# Patient Record
Sex: Male | Born: 1940 | Race: White | Hispanic: No | Marital: Married | Smoking: Never smoker
Health system: Southern US, Community
[De-identification: ages and names within clinical notes are randomized; demographics above are authoritative.]

## PROBLEM LIST (undated history)

## (undated) DIAGNOSIS — R351 Nocturia: Secondary | ICD-10-CM

## (undated) DIAGNOSIS — M5137 Other intervertebral disc degeneration, lumbosacral region: Secondary | ICD-10-CM

## (undated) DIAGNOSIS — I219 Acute myocardial infarction, unspecified: Secondary | ICD-10-CM

## (undated) DIAGNOSIS — N200 Calculus of kidney: Secondary | ICD-10-CM

## (undated) DIAGNOSIS — G709 Myoneural disorder, unspecified: Secondary | ICD-10-CM

## (undated) DIAGNOSIS — M545 Low back pain, unspecified: Secondary | ICD-10-CM

## (undated) DIAGNOSIS — Z87442 Personal history of urinary calculi: Secondary | ICD-10-CM

## (undated) DIAGNOSIS — I4510 Unspecified right bundle-branch block: Secondary | ICD-10-CM

## (undated) DIAGNOSIS — Z8719 Personal history of other diseases of the digestive system: Secondary | ICD-10-CM

## (undated) DIAGNOSIS — T4145XA Adverse effect of unspecified anesthetic, initial encounter: Secondary | ICD-10-CM

## (undated) DIAGNOSIS — D509 Iron deficiency anemia, unspecified: Secondary | ICD-10-CM

## (undated) DIAGNOSIS — N183 Chronic kidney disease, stage 3 unspecified: Secondary | ICD-10-CM

## (undated) DIAGNOSIS — E785 Hyperlipidemia, unspecified: Secondary | ICD-10-CM

## (undated) DIAGNOSIS — M1990 Unspecified osteoarthritis, unspecified site: Secondary | ICD-10-CM

## (undated) DIAGNOSIS — Z955 Presence of coronary angioplasty implant and graft: Secondary | ICD-10-CM

## (undated) DIAGNOSIS — K219 Gastro-esophageal reflux disease without esophagitis: Secondary | ICD-10-CM

## (undated) DIAGNOSIS — T8859XA Other complications of anesthesia, initial encounter: Secondary | ICD-10-CM

## (undated) DIAGNOSIS — G8929 Other chronic pain: Secondary | ICD-10-CM

## (undated) DIAGNOSIS — I2510 Atherosclerotic heart disease of native coronary artery without angina pectoris: Secondary | ICD-10-CM

## (undated) DIAGNOSIS — Z85820 Personal history of malignant melanoma of skin: Secondary | ICD-10-CM

## (undated) DIAGNOSIS — M51379 Other intervertebral disc degeneration, lumbosacral region without mention of lumbar back pain or lower extremity pain: Secondary | ICD-10-CM

## (undated) DIAGNOSIS — I10 Essential (primary) hypertension: Secondary | ICD-10-CM

## (undated) DIAGNOSIS — E119 Type 2 diabetes mellitus without complications: Secondary | ICD-10-CM

## (undated) DIAGNOSIS — Z794 Long term (current) use of insulin: Secondary | ICD-10-CM

## (undated) DIAGNOSIS — I252 Old myocardial infarction: Secondary | ICD-10-CM

## (undated) DIAGNOSIS — Z7901 Long term (current) use of anticoagulants: Secondary | ICD-10-CM

## (undated) HISTORY — PX: MELANOMA EXCISION: SHX5266

## (undated) HISTORY — DX: Atherosclerotic heart disease of native coronary artery without angina pectoris: I25.10

## (undated) HISTORY — PX: CATARACT EXTRACTION W/ INTRAOCULAR LENS  IMPLANT, BILATERAL: SHX1307

## (undated) HISTORY — PX: ANAL FISSURE REPAIR: SHX2312

## (undated) HISTORY — DX: Hyperlipidemia, unspecified: E78.5

## (undated) HISTORY — PX: HAND TENDON SURGERY: SHX663

## (undated) HISTORY — PX: BACK SURGERY: SHX140

## (undated) HISTORY — DX: Unspecified right bundle-branch block: I45.10

## (undated) HISTORY — PX: KNEE ARTHROSCOPY: SHX127

## (undated) HISTORY — PX: MEDIAL PARTIAL KNEE REPLACEMENT: SHX5965

---

## 1983-02-07 HISTORY — PX: SHOULDER SURGERY: SHX246

## 1997-11-03 ENCOUNTER — Encounter: Admission: RE | Admit: 1997-11-03 | Discharge: 1998-02-01 | Payer: Self-pay | Admitting: Gastroenterology

## 2000-08-03 ENCOUNTER — Encounter: Payer: Self-pay | Admitting: Emergency Medicine

## 2000-08-03 ENCOUNTER — Emergency Department (HOSPITAL_COMMUNITY): Admission: EM | Admit: 2000-08-03 | Discharge: 2000-08-03 | Payer: Self-pay | Admitting: Emergency Medicine

## 2002-04-22 ENCOUNTER — Encounter: Payer: Self-pay | Admitting: Gastroenterology

## 2002-04-22 ENCOUNTER — Encounter: Admission: RE | Admit: 2002-04-22 | Discharge: 2002-04-22 | Payer: Self-pay | Admitting: Gastroenterology

## 2002-10-29 ENCOUNTER — Ambulatory Visit (HOSPITAL_COMMUNITY): Admission: RE | Admit: 2002-10-29 | Discharge: 2002-10-29 | Payer: Self-pay | Admitting: Orthopedic Surgery

## 2002-10-29 ENCOUNTER — Ambulatory Visit (HOSPITAL_BASED_OUTPATIENT_CLINIC_OR_DEPARTMENT_OTHER): Admission: RE | Admit: 2002-10-29 | Discharge: 2002-10-29 | Payer: Self-pay | Admitting: Orthopedic Surgery

## 2006-09-10 ENCOUNTER — Emergency Department (HOSPITAL_COMMUNITY): Admission: EM | Admit: 2006-09-10 | Discharge: 2006-09-11 | Payer: Self-pay | Admitting: Emergency Medicine

## 2007-03-28 ENCOUNTER — Encounter: Admission: RE | Admit: 2007-03-28 | Discharge: 2007-04-24 | Payer: Self-pay | Admitting: Orthopedic Surgery

## 2007-10-10 ENCOUNTER — Encounter: Admission: RE | Admit: 2007-10-10 | Discharge: 2007-10-10 | Payer: Self-pay | Admitting: Nephrology

## 2008-01-23 ENCOUNTER — Encounter: Admission: RE | Admit: 2008-01-23 | Discharge: 2008-02-06 | Payer: Self-pay | Admitting: Orthopedic Surgery

## 2008-02-07 ENCOUNTER — Encounter: Admission: RE | Admit: 2008-02-07 | Discharge: 2008-03-24 | Payer: Self-pay | Admitting: Orthopedic Surgery

## 2009-07-19 ENCOUNTER — Ambulatory Visit (HOSPITAL_COMMUNITY): Admission: RE | Admit: 2009-07-19 | Discharge: 2009-07-19 | Payer: Self-pay | Admitting: Urology

## 2009-07-21 ENCOUNTER — Ambulatory Visit: Payer: Self-pay | Admitting: Radiology

## 2009-07-21 ENCOUNTER — Observation Stay (HOSPITAL_COMMUNITY): Admission: EM | Admit: 2009-07-21 | Discharge: 2009-07-22 | Payer: Self-pay | Admitting: Emergency Medicine

## 2009-07-21 ENCOUNTER — Encounter: Payer: Self-pay | Admitting: Emergency Medicine

## 2010-04-24 LAB — BASIC METABOLIC PANEL
BUN: 16 mg/dL (ref 6–23)
BUN: 19 mg/dL (ref 6–23)
CO2: 30 mEq/L (ref 19–32)
CO2: 30 mEq/L (ref 19–32)
Calcium: 8.9 mg/dL (ref 8.4–10.5)
Calcium: 8.9 mg/dL (ref 8.4–10.5)
Chloride: 100 mEq/L (ref 96–112)
Chloride: 98 mEq/L (ref 96–112)
Creatinine, Ser: 1.3 mg/dL (ref 0.4–1.5)
Creatinine, Ser: 1.47 mg/dL (ref 0.4–1.5)
GFR calc Af Amer: 58 mL/min — ABNORMAL LOW (ref >60)
GFR calc Af Amer: >60 mL/min (ref >60)
GFR calc non Af Amer: 48 mL/min — ABNORMAL LOW (ref >60)
GFR calc non Af Amer: 55 mL/min — ABNORMAL LOW (ref >60)
Glucose, Bld: 105 mg/dL — ABNORMAL HIGH (ref 70–99)
Glucose, Bld: 201 mg/dL — ABNORMAL HIGH (ref 70–99)
Potassium: 4.2 mEq/L (ref 3.5–5.1)
Potassium: 4.9 mEq/L (ref 3.5–5.1)
Sodium: 137 mEq/L (ref 135–145)
Sodium: 139 mEq/L (ref 135–145)

## 2010-04-24 LAB — CBC
HCT: 35.5 % — ABNORMAL LOW (ref 39.0–52.0)
HCT: 41.9 % (ref 39.0–52.0)
Hemoglobin: 12.8 g/dL — ABNORMAL LOW (ref 13.0–17.0)
Hemoglobin: 13.8 g/dL (ref 13.0–17.0)
MCHC: 33 g/dL (ref 30.0–36.0)
MCHC: 35.9 g/dL (ref 30.0–36.0)
MCV: 86.1 fL (ref 78.0–100.0)
MCV: 89 fL (ref 78.0–100.0)
Platelets: 168 10*3/uL (ref 150–400)
Platelets: 191 10*3/uL (ref 150–400)
RBC: 4.13 MIL/uL — ABNORMAL LOW (ref 4.22–5.81)
RBC: 4.7 MIL/uL (ref 4.22–5.81)
RDW: 13.1 % (ref 11.5–15.5)
RDW: 13.5 % (ref 11.5–15.5)
WBC: 11.3 10*3/uL — ABNORMAL HIGH (ref 4.0–10.5)
WBC: 7.9 10*3/uL (ref 4.0–10.5)

## 2010-04-24 LAB — URINALYSIS, ROUTINE W REFLEX MICROSCOPIC
Bilirubin Urine: NEGATIVE
Glucose, UA: 100 mg/dL — AB
Ketones, ur: NEGATIVE mg/dL
Nitrite: NEGATIVE
Protein, ur: 30 mg/dL — AB
Specific Gravity, Urine: 1.015 (ref 1.005–1.030)
Urobilinogen, UA: 0.2 mg/dL (ref 0.0–1.0)
pH: 5.5 (ref 5.0–8.0)

## 2010-04-24 LAB — GLUCOSE, CAPILLARY
Glucose-Capillary: 108 mg/dL — ABNORMAL HIGH (ref 70–99)
Glucose-Capillary: 111 mg/dL — ABNORMAL HIGH (ref 70–99)
Glucose-Capillary: 112 mg/dL — ABNORMAL HIGH (ref 70–99)
Glucose-Capillary: 176 mg/dL — ABNORMAL HIGH (ref 70–99)
Glucose-Capillary: 190 mg/dL — ABNORMAL HIGH (ref 70–99)
Glucose-Capillary: 82 mg/dL (ref 70–99)
Glucose-Capillary: 95 mg/dL (ref 70–99)

## 2010-04-24 LAB — URINE MICROSCOPIC-ADD ON

## 2010-04-24 LAB — DIFFERENTIAL
Basophils Absolute: 0 10*3/uL (ref 0.0–0.1)
Basophils Relative: 0 % (ref 0–1)
Eosinophils Absolute: 0.1 10*3/uL (ref 0.0–0.7)
Eosinophils Relative: 1 % (ref 0–5)
Lymphocytes Relative: 10 % — ABNORMAL LOW (ref 12–46)
Lymphs Abs: 1.1 10*3/uL (ref 0.7–4.0)
Monocytes Absolute: 0.7 10*3/uL (ref 0.1–1.0)
Monocytes Relative: 6 % (ref 3–12)
Neutro Abs: 9.4 10*3/uL — ABNORMAL HIGH (ref 1.7–7.7)
Neutrophils Relative %: 83 % — ABNORMAL HIGH (ref 43–77)

## 2010-04-25 LAB — GLUCOSE, CAPILLARY
Glucose-Capillary: 169 mg/dL — ABNORMAL HIGH (ref 70–99)
Glucose-Capillary: 68 mg/dL — ABNORMAL LOW (ref 70–99)
Glucose-Capillary: 76 mg/dL (ref 70–99)

## 2010-05-13 ENCOUNTER — Emergency Department (HOSPITAL_BASED_OUTPATIENT_CLINIC_OR_DEPARTMENT_OTHER)
Admission: EM | Admit: 2010-05-13 | Discharge: 2010-05-13 | Disposition: A | Discharge status: Home / Self Care | Payer: Medicare Other | Attending: Emergency Medicine | Admitting: Emergency Medicine

## 2010-05-13 ENCOUNTER — Emergency Department (INDEPENDENT_AMBULATORY_CARE_PROVIDER_SITE_OTHER): Payer: Medicare Other

## 2010-05-13 DIAGNOSIS — E119 Type 2 diabetes mellitus without complications: Secondary | ICD-10-CM | POA: Insufficient documentation

## 2010-05-13 DIAGNOSIS — Y92009 Unspecified place in unspecified non-institutional (private) residence as the place of occurrence of the external cause: Secondary | ICD-10-CM | POA: Insufficient documentation

## 2010-05-13 DIAGNOSIS — I10 Essential (primary) hypertension: Secondary | ICD-10-CM | POA: Insufficient documentation

## 2010-05-13 DIAGNOSIS — E7800 Pure hypercholesterolemia, unspecified: Secondary | ICD-10-CM | POA: Insufficient documentation

## 2010-05-13 DIAGNOSIS — W19XXXA Unspecified fall, initial encounter: Secondary | ICD-10-CM

## 2010-05-13 DIAGNOSIS — X500XXA Overexertion from strenuous movement or load, initial encounter: Secondary | ICD-10-CM | POA: Insufficient documentation

## 2010-05-13 DIAGNOSIS — S82899A Other fracture of unspecified lower leg, initial encounter for closed fracture: Secondary | ICD-10-CM

## 2010-06-24 NOTE — Op Note (Signed)
NAME:  Matthew [name], Matthew [name]                          ACCOUNT NO.:  [id number]   MEDICAL RECORD NO.:  [id number]                   PATIENT TYPE:  AMB   LOCATION:  DSC                                  FACILITY:  [facility]   PHYSICIAN:  Artist Pais. Mina Marble, M.D.           DATE OF BIRTH:  1941/01/06   DATE OF PROCEDURE:  10/29/2002  DATE OF DISCHARGE:                                 OPERATIVE REPORT   PREOPERATIVE DIAGNOSES:  1. Right index metacarpophalangeal joint contracture..  2. Right index finger extensor scarring.   POSTOPERATIVE DIAGNOSES:  1. Right index metacarpophalangeal joint contracture.  2. Right index finger extensor scarring.   PROCEDURE:  1. Right index finger metacarpophalangeal joint capsulectomy.  2. Right extensor indicis proprius and extensor digitorum communis to the     index tenolysis.   SURGEON:  Artist Pais. Mina Marble, M.D.   ASSISTANT:  Aura Fey. Bobbe Medico.   ANESTHESIA:  General.   TOURNIQUET TIME:  Thirty-five minutes.   COMPLICATIONS:  No complications.   DRAINS:  No drains.   SPECIMENS:  Cultures x2 sent.   OPERATIVE REPORT:  The patient was taken to the operating room and after the  induction of adequate general anesthesia, the right upper extremity was  prepped and draped in the usual sterile fashion.  An Esmarch was used to  exsanguinate the limb.  The tourniquet was then inflated to 250 mmHg.  At  this point in time, a Brunner-type incision was made over the dorsoradial  aspect of the hand and wrist over the area of his previous suspensionplasty  and flaps were raised accordingly.  The ECRB and ECRL tendons were  identified and the EDC and EIP of the index finger were also identified.  There was significant scarring on the ulnar-most aspect of the incision  where the EDC and the EIP to the index finger ran through the interval  between the second and fourth compartments.  This was released of all  scarring throughout the length of the incision and  once this was done, a  second incision was made over the metacarpophalangeal joint of the index  finger on the right hand longitudinally, 2 to 3 cm in length.  The incision  was taken down through the skin and subcutaneous tissues.  Flaps were raised  accordingly.  The interval between the [facility] and EIP was incised, the capsule  was incised and a small capsulectomy was performed.  At this point in time,  gentle manipulation revealed the joint to be 0 degrees extension and about  85 to 90 degrees of flexion.  A small amount of the collateral ligaments on  the most dorsal aspect on the radial and ulnar sides of the  metacarpophalangeal joint exiting on the right were then released with a 15  blade off the metacarpal head and neck junction, allowing complete  metacarpophalangeal joint flexion.  The wound was then thoroughly irrigated.  The  interval between the [facility] and EIP was loosely closed with 4-0 Vicryl and  the skin with 5-0 nylon in horizontal mattress sutures over the  metacarpophalangeal joint and a 3-0 Prolene subcuticular stitch for the  proximal incision over the tenolysis site.  The patient tolerated the  procedure well and was placed in a sterile dressing of Xeroform, 4 x 4's and  compressive wrap.  The patient tolerated the procedure well and went to  recovery room in stable fashion.                                               Artist Pais Mina Marble, M.D.    MAW/MEDQ  D:  10/29/2002  T:  10/30/2002  Job:  [id number]

## 2010-09-23 ENCOUNTER — Emergency Department (INDEPENDENT_AMBULATORY_CARE_PROVIDER_SITE_OTHER): Payer: Medicare Other

## 2010-09-23 ENCOUNTER — Emergency Department (HOSPITAL_BASED_OUTPATIENT_CLINIC_OR_DEPARTMENT_OTHER)
Admission: EM | Admit: 2010-09-23 | Discharge: 2010-09-24 | Disposition: A | Discharge status: Home / Self Care | Payer: Medicare Other | Attending: Emergency Medicine | Admitting: Emergency Medicine

## 2010-09-23 ENCOUNTER — Encounter: Payer: Self-pay | Admitting: *Deleted

## 2010-09-23 DIAGNOSIS — R109 Unspecified abdominal pain: Secondary | ICD-10-CM | POA: Insufficient documentation

## 2010-09-23 DIAGNOSIS — N201 Calculus of ureter: Secondary | ICD-10-CM

## 2010-09-23 DIAGNOSIS — N1330 Unspecified hydronephrosis: Secondary | ICD-10-CM | POA: Insufficient documentation

## 2010-09-23 DIAGNOSIS — R112 Nausea with vomiting, unspecified: Secondary | ICD-10-CM | POA: Insufficient documentation

## 2010-09-23 DIAGNOSIS — K5730 Diverticulosis of large intestine without perforation or abscess without bleeding: Secondary | ICD-10-CM

## 2010-09-23 DIAGNOSIS — E119 Type 2 diabetes mellitus without complications: Secondary | ICD-10-CM | POA: Insufficient documentation

## 2010-09-23 LAB — CBC
HCT: 43.4 % (ref 39.0–52.0)
Hemoglobin: 15.2 g/dL (ref 13.0–17.0)
MCH: 30.5 pg (ref 26.0–34.0)
MCHC: 35 g/dL (ref 30.0–36.0)
MCV: 87.1 fL (ref 78.0–100.0)
Platelets: 250 10*3/uL (ref 150–400)
RBC: 4.98 MIL/uL (ref 4.22–5.81)
RDW: 13.2 % (ref 11.5–15.5)
WBC: 10.6 10*3/uL — ABNORMAL HIGH (ref 4.0–10.5)

## 2010-09-23 LAB — BASIC METABOLIC PANEL
BUN: 26 mg/dL — ABNORMAL HIGH (ref 6–23)
CO2: 26 mEq/L (ref 19–32)
Calcium: 9.6 mg/dL (ref 8.4–10.5)
Chloride: 98 mEq/L (ref 96–112)
Creatinine, Ser: 1.5 mg/dL — ABNORMAL HIGH (ref 0.50–1.35)
GFR calc Af Amer: 56 mL/min — ABNORMAL LOW (ref >60)
GFR calc non Af Amer: 47 mL/min — ABNORMAL LOW (ref >60)
Glucose, Bld: 220 mg/dL — ABNORMAL HIGH (ref 70–99)
Potassium: 3.8 mEq/L (ref 3.5–5.1)
Sodium: 136 mEq/L (ref 135–145)

## 2010-09-23 MED ORDER — ONDANSETRON HCL 4 MG/2ML IJ SOLN
4.0000 mg | Freq: Once | INTRAMUSCULAR | Status: AC
  Administered 2010-09-23: 4 mg via INTRAVENOUS
  Filled 2010-09-23: qty 2

## 2010-09-23 MED ORDER — ONDANSETRON HCL 4 MG/2ML IJ SOLN
INTRAMUSCULAR | Status: AC
  Administered 2010-09-23: 4 mg via INTRAVENOUS
  Filled 2010-09-23: qty 2

## 2010-09-23 MED ORDER — SODIUM CHLORIDE 0.9 % IV SOLN
INTRAVENOUS | Status: DC
  Administered 2010-09-23: 22:00:00 via INTRAVENOUS

## 2010-09-23 MED ORDER — ONDANSETRON HCL 4 MG/2ML IJ SOLN
4.0000 mg | Freq: Once | INTRAMUSCULAR | Status: AC
  Administered 2010-09-23: 4 mg via INTRAVENOUS

## 2010-09-23 MED ORDER — HYDROMORPHONE HCL 1 MG/ML IJ SOLN
INTRAMUSCULAR | Status: AC
  Administered 2010-09-23: 1 mg via INTRAVENOUS
  Filled 2010-09-23: qty 1

## 2010-09-23 MED ORDER — HYDROMORPHONE HCL 1 MG/ML IJ SOLN
1.0000 mg | Freq: Once | INTRAMUSCULAR | Status: AC
  Administered 2010-09-23: 1 mg via INTRAVENOUS

## 2010-09-23 MED ORDER — HYDROMORPHONE HCL 1 MG/ML IJ SOLN
1.0000 mg | Freq: Once | INTRAMUSCULAR | Status: AC
  Administered 2010-09-23: 1 mg via INTRAVENOUS
  Filled 2010-09-23: qty 1

## 2010-09-23 MED ORDER — HYDROCODONE-ACETAMINOPHEN 5-325 MG PO TABS: 1.0000 | ORAL_TABLET | Freq: Four times a day (QID) | ORAL | Status: AC | PRN

## 2010-09-23 MED ORDER — NAPROXEN 500 MG PO TABS: 500.0000 mg | ORAL_TABLET | Freq: Two times a day (BID) | ORAL | Status: AC

## 2010-09-23 MED ORDER — ONDANSETRON HCL 4 MG PO TABS: 4.0000 mg | ORAL_TABLET | Freq: Four times a day (QID) | ORAL | Status: AC

## 2010-09-23 MED ORDER — TAMSULOSIN HCL 0.4 MG PO CAPS: 0.4000 mg | ORAL_CAPSULE | Freq: Every day | ORAL | Status: DC

## 2010-09-23 NOTE — ED Provider Notes (Signed)
History     CSN: [id number] Arrival date & time: 09/23/2010  9:32 PM  Chief Complaint  Patient presents with  . Flank Pain   Patient is a 70 y.o. male presenting with flank pain. The history is provided by the patient and the spouse.  Flank Pain The current episode started 1 to 2 hours ago. The problem occurs constantly. The problem has been gradually worsening. Associated symptoms include abdominal pain. Pertinent negatives include no chest pain, no headaches and no shortness of breath. The symptoms are aggravated by nothing. The symptoms are relieved by nothing. He has tried nothing for the symptoms.    Past Medical History  Diagnosis Date  . Diabetes mellitus   . Kidney stones 2011    Past Surgical History  Procedure Date  . Knee surgery   . Shoulder surgery     No family history on file.  History  Substance Use Topics  . Smoking status: Never Smoker   . Smokeless tobacco: Never Used  . Alcohol Use: No      Review of Systems  Constitutional: Negative for fever.  HENT: Negative for congestion and neck pain.   Respiratory: Negative for cough, chest tightness and shortness of breath.   Cardiovascular: Negative for chest pain.  Gastrointestinal: Positive for nausea, vomiting and abdominal pain.  Genitourinary: Positive for flank pain. Negative for hematuria.  Musculoskeletal: Negative for back pain.  Neurological: Negative for headaches.  Hematological: Does not bruise/bleed easily.  Psychiatric/Behavioral: Negative for confusion.    Physical Exam  BP 196/100  Pulse 72  Temp(Src) 98.3 F (36.8 C) (Oral)  Resp 18  Ht 5\' 9"  (1.753 m)  Wt 226 lb (102.513 kg)  BMI 33.37 kg/m2  SpO2 98%  Physical Exam  Nursing note and vitals reviewed. Constitutional: He is oriented to person, place, and time. He appears well-developed and well-nourished. He appears distressed.  HENT:  Head: Normocephalic and atraumatic.  Eyes: Conjunctivae and EOM are normal. Pupils are  equal, round, and reactive to light.  Neck: Normal range of motion. Neck supple.  Cardiovascular: Normal rate, regular rhythm and normal heart sounds.   Pulmonary/Chest: Effort normal and breath sounds normal. No respiratory distress.  Abdominal: Soft. Bowel sounds are normal. There is no tenderness.  Musculoskeletal: He exhibits no edema.  Neurological: He is alert and oriented to person, place, and time. No cranial nerve deficit. He exhibits normal muscle tone.  Skin: Skin is warm. No rash noted. He is diaphoretic.    ED Course  Procedures  MDM IMPROVED WIT PAIN MEDS CT CONFIRMS LEFT 3 MM URETERAL STONE. HX OF SAME IN PAST. SHOULD BE ABLE TO PASS HAS UROLOGY FU.   Results for orders placed during the hospital encounter of 09/23/10  CBC      Component Value Range   WBC 10.6 (*) 4.0 - 10.5 (K/uL)   RBC 4.98  4.22 - 5.81 (MIL/uL)   Hemoglobin 15.2  13.0 - 17.0 (g/dL)   HCT 40.9  81.1 - 91.4 (%)   MCV 87.1  78.0 - 100.0 (fL)   MCH 30.5  26.0 - 34.0 (pg)   MCHC 35.0  30.0 - 36.0 (g/dL)   RDW 78.2  95.6 - 21.3 (%)   Platelets 250  150 - 400 (K/uL)  BASIC METABOLIC PANEL      Component Value Range   Sodium 136  135 - 145 (mEq/L)   Potassium 3.8  3.5 - 5.1 (mEq/L)   Chloride 98  96 - 112 (mEq/L)  CO2 26  19 - 32 (mEq/L)   Glucose, Bld 220 (*) 70 - 99 (mg/dL)   BUN 26 (*) 6 - 23 (mg/dL)   Creatinine, Ser 1.61 (*) 0.50 - 1.35 (mg/dL)   Calcium 9.6  8.4 - 09.6 (mg/dL)   GFR calc non Af Amer 47 (*) >60 (mL/min)   GFR calc Af Amer 56 (*) >60 (mL/min)   Results for orders placed during the hospital encounter of 09/23/10  CBC      Component Value Range   WBC 10.6 (*) 4.0 - 10.5 (K/uL)   RBC 4.98  4.22 - 5.81 (MIL/uL)   Hemoglobin 15.2  13.0 - 17.0 (g/dL)   HCT 04.5  40.9 - 81.1 (%)   MCV 87.1  78.0 - 100.0 (fL)   MCH 30.5  26.0 - 34.0 (pg)   MCHC 35.0  30.0 - 36.0 (g/dL)   RDW 91.4  78.2 - 95.6 (%)   Platelets 250  150 - 400 (K/uL)  BASIC METABOLIC PANEL      Component Value  Range   Sodium 136  135 - 145 (mEq/L)   Potassium 3.8  3.5 - 5.1 (mEq/L)   Chloride 98  96 - 112 (mEq/L)   CO2 26  19 - 32 (mEq/L)   Glucose, Bld 220 (*) 70 - 99 (mg/dL)   BUN 26 (*) 6 - 23 (mg/dL)   Creatinine, Ser 2.13 (*) 0.50 - 1.35 (mg/dL)   Calcium 9.6  8.4 - 08.6 (mg/dL)   GFR calc non Af Amer 47 (*) >60 (mL/min)   GFR calc Af Amer 56 (*) >60 (mL/min)   Ct Abdomen Pelvis Wo Contrast  09/23/2010  *RADIOLOGY REPORT*  Clinical Data: Left flank pain began 2 hours ago, history kidney stones, diabetes  CT ABDOMEN AND PELVIS WITHOUT CONTRAST  Technique:  Multidetector CT imaging of the abdomen and pelvis was performed following the standard protocol without intravenous contrast. Sagittal and coronal MPR images reconstructed from axial data set.  Comparison: 04/28/2010  Findings: Dependent atelectasis at bilateral lung bases. Few scattered coronary arterial calcifications. Atherosclerotic calcifications aorta without aneurysm. Left hydronephrosis secondary to a 3 mm diameter proximal right ureteral calculus image 54. Extensive peripelvic, perinephric and proximal peri ureteral edema suggesting forniceal rupture. Nonobstructing right renal calculus. Distal ureters and bladder unremarkable.  Within limits of a nonenhanced exam, no focal abnormalities of liver, spleen, pancreas, or adrenal glands. Stomach and small bowel loops unremarkable for technique. Normal appendix. Diverticulosis of sigmoid colon without evidence of diverticulitis. No mass, adenopathy, or free fluid. 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  IMPRESSION: Left hydronephrosis secondary to a 3 mm diameter proximal left ureteral calculus. Significant peripelvic, perinephric and proximal peri ureteral edema left kidney most likely representing forniceal rupture. Sigmoid diverticulosis.  Original Report Authenticated By: Lollie Marrow, M.D.        Shelda Jakes,  MD 09/24/10 0001

## 2010-09-23 NOTE — ED Notes (Signed)
Pt reports flank pain that started 2 hours ago. He has a hx of kidney stones. Denies pain or burning upon urination.

## 2010-09-24 ENCOUNTER — Encounter (HOSPITAL_BASED_OUTPATIENT_CLINIC_OR_DEPARTMENT_OTHER): Payer: Self-pay | Admitting: *Deleted

## 2011-09-13 ENCOUNTER — Other Ambulatory Visit: Payer: Self-pay | Admitting: Neurological Surgery

## 2011-09-13 DIAGNOSIS — M545 Low back pain, unspecified: Secondary | ICD-10-CM

## 2011-09-15 ENCOUNTER — Ambulatory Visit
Admission: RE | Admit: 2011-09-15 | Discharge: 2011-09-15 | Disposition: A | Discharge status: Home / Self Care | Payer: Medicare Other | Source: Ambulatory Visit | Attending: Neurological Surgery | Admitting: Neurological Surgery

## 2011-09-15 DIAGNOSIS — M545 Low back pain, unspecified: Secondary | ICD-10-CM

## 2011-11-06 ENCOUNTER — Other Ambulatory Visit: Payer: Self-pay | Admitting: Neurological Surgery

## 2011-12-07 ENCOUNTER — Other Ambulatory Visit (HOSPITAL_COMMUNITY): Payer: Medicare Other

## 2012-01-01 ENCOUNTER — Encounter (HOSPITAL_COMMUNITY): Payer: Self-pay | Admitting: Pharmacy Technician

## 2012-01-02 ENCOUNTER — Ambulatory Visit (HOSPITAL_COMMUNITY)
Admission: RE | Admit: 2012-01-02 | Discharge: 2012-01-02 | Disposition: A | Discharge status: Home / Self Care | Payer: Medicare Other | Source: Ambulatory Visit | Attending: Anesthesiology | Admitting: Anesthesiology

## 2012-01-02 ENCOUNTER — Encounter (HOSPITAL_COMMUNITY): Payer: Self-pay

## 2012-01-02 ENCOUNTER — Encounter (HOSPITAL_COMMUNITY)
Admission: RE | Admit: 2012-01-02 | Discharge: 2012-01-02 | Disposition: A | Discharge status: Home / Self Care | Payer: Medicare Other | Source: Ambulatory Visit | Attending: Neurological Surgery | Admitting: Neurological Surgery

## 2012-01-02 DIAGNOSIS — Z01811 Encounter for preprocedural respiratory examination: Secondary | ICD-10-CM | POA: Insufficient documentation

## 2012-01-02 HISTORY — DX: Essential (primary) hypertension: I10

## 2012-01-02 HISTORY — DX: Adverse effect of unspecified anesthetic, initial encounter: T41.45XA

## 2012-01-02 HISTORY — DX: Other complications of anesthesia, initial encounter: T88.59XA

## 2012-01-02 HISTORY — DX: Unspecified osteoarthritis, unspecified site: M19.90

## 2012-01-02 LAB — BASIC METABOLIC PANEL
BUN: 13 mg/dL (ref 6–23)
CO2: 30 mEq/L (ref 19–32)
Calcium: 9.7 mg/dL (ref 8.4–10.5)
Chloride: 100 mEq/L (ref 96–112)
Creatinine, Ser: 1.04 mg/dL (ref 0.50–1.35)
GFR calc Af Amer: 83 mL/min — ABNORMAL LOW (ref >90)
GFR calc non Af Amer: 71 mL/min — ABNORMAL LOW (ref >90)
Glucose, Bld: 136 mg/dL — ABNORMAL HIGH (ref 70–99)
Potassium: 4.1 mEq/L (ref 3.5–5.1)
Sodium: 139 mEq/L (ref 135–145)

## 2012-01-02 LAB — CBC
HCT: 44.1 % (ref 39.0–52.0)
Hemoglobin: 15 g/dL (ref 13.0–17.0)
MCH: 30.1 pg (ref 26.0–34.0)
MCHC: 34 g/dL (ref 30.0–36.0)
MCV: 88.4 fL (ref 78.0–100.0)
Platelets: 215 10*3/uL (ref 150–400)
RBC: 4.99 MIL/uL (ref 4.22–5.81)
RDW: 12.8 % (ref 11.5–15.5)
WBC: 5.6 10*3/uL (ref 4.0–10.5)

## 2012-01-02 LAB — TYPE AND SCREEN
ABO/RH(D): O POS
Antibody Screen: NEGATIVE

## 2012-01-02 LAB — SURGICAL PCR SCREEN
MRSA, PCR: NEGATIVE
Staphylococcus aureus: NEGATIVE

## 2012-01-02 LAB — ABO/RH: ABO/RH(D): O POS

## 2012-01-02 NOTE — Pre-Procedure Instructions (Addendum)
[name]  01/02/2012   Your procedure is scheduled on:  Friday, December 6th.  Report to [facility] at 8:30AM. (per Erie Noe).  Call this number if you have problems the morning of surgery: [phone]   Remember:                                    Nothing to eat or drink after Midnight.       Take these medicines the morning of surgery with A SIP OF WATER:  Oxycodone-Acetaminophen (Percocet) and Diphenhydramine (Benadryl) If needed.  Stop taking Aspirin, Coumadin, Plavix, Effient and herbal medications.  Do not take any NSAIDS ie:  Ibuprofen, Advil, Naproxen.    Do not wear jewelry, make-up or nail polish.  Do not wear lotions, powders, or perfumes. You may wear deodorant.  Do not shave 48 hours prior to surgery. Men may shave face and neck.  Do not bring valuables to the hospital.  Contacts, dentures or bridgework may not be worn into surgery.  Leave suitcase in the car. After surgery it may be brought to your room.  For patients admitted to the hospital, checkout time is 11:00 AM the day of discharge.   Patients discharged the day of surgery will not be allowed to drive home.  Name and phone number of your driver: NA  Special Instructions: Shower using CHG 2 nights before surgery and the night before surgery.  If you shower the day of surgery use CHG.  Use special wash - you have one bottle of CHG for all showers.  You should use approximately 1/3 of the bottle for each shower.   Please read over the following fact sheets that you were given: Pain Booklet, Coughing and Deep Breathing, Blood Transfusion Information and Surgical Site Infection Prevention

## 2012-01-02 NOTE — Progress Notes (Signed)
Pt said that he went "flat line" during knee surgery in 2009.  "Too much anesthesia".  My next surgery I heard them talking and  Using a hammer- "too little anesthesia".  The last surgery was just right".  I faxed a request to [facility] requesting all ansesthia and discharge notes since 2009.

## 2012-01-02 NOTE — Progress Notes (Signed)
Pt said that he has had a Stress test in the past , unsure who did it,.  Pt said it was ordered by medical MD, "he one before Dr Donette Larry."  I faxed a request to Dr Venita Sheffield office requesting results and last office note.

## 2012-01-03 ENCOUNTER — Encounter (HOSPITAL_COMMUNITY): Payer: Self-pay | Admitting: Vascular Surgery

## 2012-01-03 NOTE — Consult Note (Addendum)
Anesthesia chart review: Patient is a 71 year old male scheduled for L3-4, L5-S1 PLIF by Dr. Yetta Barre on 01/12/2012. History includes nonsmoker, obesity, diabetes mellitus type 2, hypertension, arthritis, nephrolithiasis, CKD stage II, depression, dysrhythmia without mention of atrial fibrillation.  PCP is Dr. Georgann Housekeeper [facility]).  BP was 191/99 at his PAT visit.  Unfortunately, it was not rechecked.  His PCP notes from 12/26/11 indicated an office BP of 140/80 and home BP of 125/80.  His anesthesia history is documented by his PAT RN as, "Pt said that he went "flat line" during knee surgery in 2009. "Too much anesthesia". My next surgery I heard them talking and Using a hammer- "too little anesthesia". The last surgery was just right". I faxed a request to [facility] requesting all ansesthia and discharge notes since 2009."  I was not asked to see patient during his PAT visit.  Records from [place] are still pending.  EKG on 01/02/2012 showed normal sinus rhythm, right bundle branch block.  His QRS complex has widened since his last EKG on 08/20/09.  He reports a previous stress test, but it was done prior to him seeing Dr. Donette Larry.  ([facility] [place] does not have record of a stress test.)  Chest x-ray on 01/02/2012 showed no acute disease.   Preoperative labs noted. Cr 1.04, glucose 136. CBC WNL.  I called to speak with Mr. Yin to inquire further about his cardiac and anesthesia history.  He was not able to speak with me today because, unfortunately, his mother-in-law died earlier today.    I reviewed currently available information with Anesthesiologist Dr. Noreene Larsson who recommends a pre-operative cardiology evaluation.  I have notified Erie Noe at Dr. Edward Qualia office.    Will follow-up when additional cardiology and anesthesia records are available.  Shonna Chock, PA-C 01/03/12 1600  Addendum: 01/11/12 1450 Case has been moved to 02/02/12.  I did receive anesthesia records from 2008-2009  from [facility].  Notes indicate that he reported that in 1987 he received "so much meds that they 'lost him on the table'" during his left shoulder arthroscopy [facility].  He was kept overnight.  He reportedly had no problems with a right hand surgery in 2005.  In February 2009 he underwent right TKR with "SAB c Lumbar Plexus, propofol/ketamine infusion, no complications."  Patient reported he could hear them talking and using a hammer.  In 01/06/08 he underwent left TKR with regional and spinal without incident.    I'll follow-up once cardiology records are available.

## 2012-01-11 NOTE — Progress Notes (Signed)
Patient had surgery at [facility] will request anesthesia records from [facility]

## 2012-01-25 ENCOUNTER — Encounter (HOSPITAL_COMMUNITY)
Admission: RE | Admit: 2012-01-25 | Discharge: 2012-01-25 | Disposition: A | Discharge status: Home / Self Care | Payer: Medicare Other | Source: Ambulatory Visit | Attending: Neurological Surgery | Admitting: Neurological Surgery

## 2012-01-25 LAB — BASIC METABOLIC PANEL
BUN: 18 mg/dL (ref 6–23)
CO2: 28 mEq/L (ref 19–32)
Calcium: 9.9 mg/dL (ref 8.4–10.5)
Chloride: 101 mEq/L (ref 96–112)
Creatinine, Ser: 1.11 mg/dL (ref 0.50–1.35)
GFR calc Af Amer: 76 mL/min — ABNORMAL LOW (ref >90)
GFR calc non Af Amer: 66 mL/min — ABNORMAL LOW (ref >90)
Glucose, Bld: 158 mg/dL — ABNORMAL HIGH (ref 70–99)
Potassium: 4.3 mEq/L (ref 3.5–5.1)
Sodium: 141 mEq/L (ref 135–145)

## 2012-01-25 LAB — CBC
HCT: 42.9 % (ref 39.0–52.0)
Hemoglobin: 14.4 g/dL (ref 13.0–17.0)
MCH: 30.2 pg (ref 26.0–34.0)
MCHC: 33.6 g/dL (ref 30.0–36.0)
MCV: 89.9 fL (ref 78.0–100.0)
Platelets: 219 10*3/uL (ref 150–400)
RBC: 4.77 MIL/uL (ref 4.22–5.81)
RDW: 12.8 % (ref 11.5–15.5)
WBC: 6.3 10*3/uL (ref 4.0–10.5)

## 2012-01-25 LAB — TYPE AND SCREEN
ABO/RH(D): O POS
Antibody Screen: NEGATIVE

## 2012-01-25 NOTE — Progress Notes (Signed)
Pt had a cardiology workup at [facility].  I have requested results from that office.

## 2012-01-25 NOTE — Pre-Procedure Instructions (Signed)
[address]  01/25/2012   Your procedure is scheduled on: Friday, December 27th.  Report to [facility] at 5:30AM.  Call this number if you have problems the morning of surgery: [phone]   Remember: Nothing to eat or drink after Midnight.   Take these medicines the morning of surgery with A SIP OF WATER: Diphenhydramine (Benadryl) and Oxycodone- Acetaminophen (Percocet) if needed.   Do not wear jewelry, make-up or nail polish.  Do not wear lotions, powders, or perfumes. You may wear deodorant.  Do not shave 48 hours prior to surgery. Men may shave face and neck.  Do not bring valuables to the hospital.  Contacts, dentures or bridgework may not be worn into surgery.  Leave suitcase in the car. After surgery it may be brought to your room.  For patients admitted to the hospital, checkout time is 11:00 AM the day of discharge.   Patients discharged the day of surgery will not be allowed to drive home.  Name and phone number of your driver: NA   Special Instructions: Shower using CHG 2 nights before surgery and the night before surgery.  If you shower the day of surgery use CHG.  Use special wash - you have one bottle of CHG for all showers.  You should use approximately 1/3 of the bottle for each shower.   Please read over the following fact sheets that you were given: Pain Booklet, Coughing and Deep Breathing, Blood Transfusion Information and Surgical Site Infection Prevention

## 2012-01-26 NOTE — Consult Note (Signed)
Anesthesia chart review: Patient is a 71 year old male scheduled for L3-4, L5-S1 PLIF by Dr. Yetta Barre on 02/02/12.  It was initially scheduled for 01/12/2012, but anesthesia requested a cardiology evaluation and his mother-in-law passed away.  History includes nonsmoker, obesity, diabetes mellitus type 2, hypertension, arthritis, nephrolithiasis, CKD stage II, depression, dysrhythmia without mention of atrial fibrillation. PCP is Dr. Georgann Housekeeper [facility]).   His anesthesia history is documented by his PAT RN as, "Pt said that he went "flat line" during knee surgery in 2009 due to "Too much anesthesia". The following surgery he could hear therm talking and using a hammer--"too little anesthesia."  The last surgery was just right". These were all done at [facility]  I reviewed anesthesia records received from [id number].  Notes indicate that he reported that in 1987 he received "so much meds that they 'lost him on the table'" during his left shoulder arthroscopy at a satellite out[facility]patient surgical center. He was kept overnight. He reportedly had no problems with a right hand surgery in 2005. In February 2009 he underwent right TKR with "SAB c Lumbar Plexus, propofol/ketamine infusion, no complications." Patient reported he could hear them talking and using a hammer. In 01/06/08 he underwent left TKR with regional and spinal without incident.   EKG on 01/02/2012 showed normal sinus rhythm, right bundle branch block. His QRS complex has widened since his last EKG on 08/20/09. He was seen by Cardiologist Dr. Florian Buff at [facility] on 01/17/12 for HTN follow-up (following an increase in his anti-hypertensive regimen) and pre-operative evaluation. Patient's blood pressure was better controlled, and he had had a recent low risk stress test.  Ultimately, Dr. Sherril Croon cleared patient for back surgery as planned. His cardiac risk was felt low.   Nuclear stress test on 01/12/12 showed: Rest EKG showed normal sinus  rhythm, right bundle branch block. Stress EKG was equivocal for ischemia with development of additional 1 mm ST depression in V2 through V5. He was hypertensive at rest and stress. He was asymptomatic. Normal myocardial perfusion imaging study without evidence of ischemia or scar and normal left ventricular ejection fraction, 77%. This represents a low risk study.  Chest x-ray on 01/02/2012 showed no acute disease.   Preoperative labs noted. His nasal PCR will be one day expired when he returns for surgery, so it will be repeated on the day of surgery.   If no significant change in his status then anticipate he can proceed as planned.  His anesthesiologist will evaluate him preoperatively and discuss the definitive anesthesia plan.  Shonna Chock, PA-C  01/26/12 1311

## 2012-02-01 MED ORDER — CEFAZOLIN SODIUM-DEXTROSE 2-3 GM-% IV SOLR
2.0000 g | INTRAVENOUS | Status: AC
  Administered 2012-02-02: 1 g via INTRAVENOUS
  Administered 2012-02-02: 2 g via INTRAVENOUS
  Filled 2012-02-01: qty 50

## 2012-02-02 ENCOUNTER — Encounter (HOSPITAL_COMMUNITY): Payer: Self-pay | Admitting: Vascular Surgery

## 2012-02-02 ENCOUNTER — Inpatient Hospital Stay (HOSPITAL_COMMUNITY): Payer: Medicare Other

## 2012-02-02 ENCOUNTER — Encounter (HOSPITAL_COMMUNITY): Payer: Self-pay | Admitting: General Practice

## 2012-02-02 ENCOUNTER — Inpatient Hospital Stay (HOSPITAL_COMMUNITY): Payer: Medicare Other | Admitting: Vascular Surgery

## 2012-02-02 ENCOUNTER — Encounter (HOSPITAL_COMMUNITY): Payer: Self-pay | Admitting: Neurological Surgery

## 2012-02-02 ENCOUNTER — Encounter (HOSPITAL_COMMUNITY)
Admission: RE | Disposition: A | Discharge status: Home / Self Care | Payer: Self-pay | Source: Ambulatory Visit | Attending: Neurological Surgery

## 2012-02-02 ENCOUNTER — Inpatient Hospital Stay (HOSPITAL_COMMUNITY)
Admission: RE | Admit: 2012-02-02 | Discharge: 2012-02-04 | DRG: 460 | Disposition: A | Discharge status: Home / Self Care | Payer: Medicare Other | Source: Ambulatory Visit | Attending: Neurological Surgery | Admitting: Neurological Surgery

## 2012-02-02 DIAGNOSIS — N182 Chronic kidney disease, stage 2 (mild): Secondary | ICD-10-CM | POA: Diagnosis present

## 2012-02-02 DIAGNOSIS — Z7982 Long term (current) use of aspirin: Secondary | ICD-10-CM

## 2012-02-02 DIAGNOSIS — Z79899 Other long term (current) drug therapy: Secondary | ICD-10-CM

## 2012-02-02 DIAGNOSIS — M129 Arthropathy, unspecified: Secondary | ICD-10-CM | POA: Diagnosis present

## 2012-02-02 DIAGNOSIS — I129 Hypertensive chronic kidney disease with stage 1 through stage 4 chronic kidney disease, or unspecified chronic kidney disease: Secondary | ICD-10-CM | POA: Diagnosis present

## 2012-02-02 DIAGNOSIS — E119 Type 2 diabetes mellitus without complications: Secondary | ICD-10-CM | POA: Diagnosis present

## 2012-02-02 DIAGNOSIS — M5137 Other intervertebral disc degeneration, lumbosacral region: Principal | ICD-10-CM | POA: Diagnosis present

## 2012-02-02 DIAGNOSIS — Z01812 Encounter for preprocedural laboratory examination: Secondary | ICD-10-CM

## 2012-02-02 DIAGNOSIS — Q762 Congenital spondylolisthesis: Secondary | ICD-10-CM

## 2012-02-02 DIAGNOSIS — M51379 Other intervertebral disc degeneration, lumbosacral region without mention of lumbar back pain or lower extremity pain: Principal | ICD-10-CM | POA: Diagnosis present

## 2012-02-02 DIAGNOSIS — M4120 Other idiopathic scoliosis, site unspecified: Secondary | ICD-10-CM | POA: Diagnosis present

## 2012-02-02 LAB — GLUCOSE, CAPILLARY
Glucose-Capillary: 126 mg/dL — ABNORMAL HIGH (ref 70–99)
Glucose-Capillary: 194 mg/dL — ABNORMAL HIGH (ref 70–99)
Glucose-Capillary: 243 mg/dL — ABNORMAL HIGH (ref 70–99)

## 2012-02-02 LAB — SURGICAL PCR SCREEN
MRSA, PCR: NEGATIVE
Staphylococcus aureus: NEGATIVE

## 2012-02-02 SURGERY — POSTERIOR LUMBAR FUSION 2 LEVEL
Anesthesia: General | Site: Spine Lumbar | Wound class: Clean

## 2012-02-02 MED ORDER — OXYCODONE HCL 5 MG PO TABS
10.0000 mg | ORAL_TABLET | ORAL | Status: DC | PRN
  Administered 2012-02-02 – 2012-02-03 (×2): 10 mg via ORAL
  Filled 2012-02-02 (×2): qty 2

## 2012-02-02 MED ORDER — INSULIN ASPART 100 UNIT/ML ~~LOC~~ SOLN
0.0000 [IU] | Freq: Three times a day (TID) | SUBCUTANEOUS | Status: DC
Start: 2012-02-02 — End: 2012-02-04
  Administered 2012-02-02 – 2012-02-03 (×2): 5 [IU] via SUBCUTANEOUS
  Administered 2012-02-03: 8 [IU] via SUBCUTANEOUS
  Administered 2012-02-03 – 2012-02-04 (×2): 3 [IU] via SUBCUTANEOUS

## 2012-02-02 MED ORDER — SODIUM CHLORIDE 0.9 % IJ SOLN
3.0000 mL | Freq: Two times a day (BID) | INTRAMUSCULAR | Status: DC
  Administered 2012-02-02 – 2012-02-03 (×2): 3 mL via INTRAVENOUS

## 2012-02-02 MED ORDER — POTASSIUM CHLORIDE IN NACL 20-0.9 MEQ/L-% IV SOLN
INTRAVENOUS | Status: DC
  Administered 2012-02-02: 20:00:00 via INTRAVENOUS
  Filled 2012-02-02 (×5): qty 1000

## 2012-02-02 MED ORDER — SODIUM CHLORIDE 0.9 % IV SOLN
INTRAVENOUS | Status: AC
  Filled 2012-02-02: qty 500

## 2012-02-02 MED ORDER — BUPIVACAINE HCL (PF) 0.25 % IJ SOLN
INTRAMUSCULAR | Status: DC | PRN
  Administered 2012-02-02: 10 mL

## 2012-02-02 MED ORDER — CEFAZOLIN SODIUM 1-5 GM-% IV SOLN
INTRAVENOUS | Status: AC
  Filled 2012-02-02: qty 50

## 2012-02-02 MED ORDER — MORPHINE SULFATE 2 MG/ML IJ SOLN: 1.0000 mg | INTRAMUSCULAR | Status: DC | PRN

## 2012-02-02 MED ORDER — CEFAZOLIN SODIUM 1-5 GM-% IV SOLN
1.0000 g | Freq: Three times a day (TID) | INTRAVENOUS | Status: AC
  Administered 2012-02-02 – 2012-02-03 (×2): 1 g via INTRAVENOUS
  Filled 2012-02-02 (×2): qty 50

## 2012-02-02 MED ORDER — METHOCARBAMOL 100 MG/ML IJ SOLN
500.0000 mg | Freq: Four times a day (QID) | INTRAVENOUS | Status: DC | PRN
  Administered 2012-02-02: 500 mg via INTRAVENOUS
  Filled 2012-02-02: qty 5

## 2012-02-02 MED ORDER — INSULIN ASPART 100 UNIT/ML ~~LOC~~ SOLN
0.0000 [IU] | Freq: Every day | SUBCUTANEOUS | Status: DC
  Administered 2012-02-02: 2 [IU] via SUBCUTANEOUS

## 2012-02-02 MED ORDER — DEXAMETHASONE 4 MG PO TABS
4.0000 mg | ORAL_TABLET | Freq: Four times a day (QID) | ORAL | Status: DC
  Administered 2012-02-02 – 2012-02-04 (×7): 4 mg via ORAL
  Filled 2012-02-02 (×11): qty 1

## 2012-02-02 MED ORDER — ARTIFICIAL TEARS OP OINT
TOPICAL_OINTMENT | OPHTHALMIC | Status: DC | PRN
  Administered 2012-02-02: 1 via OPHTHALMIC

## 2012-02-02 MED ORDER — EPHEDRINE SULFATE 50 MG/ML IJ SOLN
INTRAMUSCULAR | Status: DC | PRN
  Administered 2012-02-02: 10 mg via INTRAVENOUS

## 2012-02-02 MED ORDER — GLYCOPYRROLATE 0.2 MG/ML IJ SOLN
INTRAMUSCULAR | Status: DC | PRN
  Administered 2012-02-02: 0.6 mg via INTRAVENOUS

## 2012-02-02 MED ORDER — ONDANSETRON HCL 4 MG/2ML IJ SOLN: 4.0000 mg | Freq: Once | INTRAMUSCULAR | Status: DC | PRN

## 2012-02-02 MED ORDER — ACETAMINOPHEN 650 MG RE SUPP: 650.0000 mg | RECTAL | Status: DC | PRN

## 2012-02-02 MED ORDER — ACETAMINOPHEN 10 MG/ML IV SOLN
1000.0000 mg | Freq: Four times a day (QID) | INTRAVENOUS | Status: AC
  Administered 2012-02-02 (×4): 1000 mg via INTRAVENOUS
  Filled 2012-02-02 (×3): qty 100

## 2012-02-02 MED ORDER — 0.9 % SODIUM CHLORIDE (POUR BTL) OPTIME
TOPICAL | Status: DC | PRN
  Administered 2012-02-02: 1000 mL

## 2012-02-02 MED ORDER — METOCLOPRAMIDE HCL 5 MG/ML IJ SOLN
INTRAMUSCULAR | Status: DC | PRN
  Administered 2012-02-02: 10 mg via INTRAVENOUS

## 2012-02-02 MED ORDER — DEXAMETHASONE SODIUM PHOSPHATE 4 MG/ML IJ SOLN
INTRAMUSCULAR | Status: DC | PRN
  Administered 2012-02-02: 10 mg via INTRAVENOUS

## 2012-02-02 MED ORDER — HYDROCHLOROTHIAZIDE 12.5 MG PO CAPS
12.5000 mg | ORAL_CAPSULE | Freq: Every day | ORAL | Status: DC
  Administered 2012-02-03 – 2012-02-04 (×2): 12.5 mg via ORAL
  Filled 2012-02-02 (×3): qty 1

## 2012-02-02 MED ORDER — NEOSTIGMINE METHYLSULFATE 1 MG/ML IJ SOLN
INTRAMUSCULAR | Status: DC | PRN
  Administered 2012-02-02: 5 mg via INTRAVENOUS

## 2012-02-02 MED ORDER — ACETAMINOPHEN 10 MG/ML IV SOLN
INTRAVENOUS | Status: AC
  Filled 2012-02-02: qty 100

## 2012-02-02 MED ORDER — METHOCARBAMOL 500 MG PO TABS: 500.0000 mg | ORAL_TABLET | Freq: Four times a day (QID) | ORAL | Status: DC | PRN

## 2012-02-02 MED ORDER — SODIUM CHLORIDE 0.9 % IV SOLN: 250.0000 mL | INTRAVENOUS | Status: DC

## 2012-02-02 MED ORDER — ACETAMINOPHEN 325 MG PO TABS: 650.0000 mg | ORAL_TABLET | ORAL | Status: DC | PRN

## 2012-02-02 MED ORDER — PHENOL 1.4 % MT LIQD: 1.0000 | OROMUCOSAL | Status: DC | PRN

## 2012-02-02 MED ORDER — GLIMEPIRIDE 2 MG PO TABS
2.0000 mg | ORAL_TABLET | Freq: Every day | ORAL | Status: DC
  Administered 2012-02-03 – 2012-02-04 (×2): 2 mg via ORAL
  Filled 2012-02-02 (×3): qty 1

## 2012-02-02 MED ORDER — CELECOXIB 200 MG PO CAPS
200.0000 mg | ORAL_CAPSULE | Freq: Two times a day (BID) | ORAL | Status: DC
  Administered 2012-02-02 – 2012-02-04 (×4): 200 mg via ORAL
  Filled 2012-02-02 (×5): qty 1

## 2012-02-02 MED ORDER — DEXAMETHASONE SODIUM PHOSPHATE 4 MG/ML IJ SOLN
4.0000 mg | Freq: Four times a day (QID) | INTRAMUSCULAR | Status: DC
  Filled 2012-02-02 (×8): qty 1

## 2012-02-02 MED ORDER — LOSARTAN POTASSIUM-HCTZ 100-12.5 MG PO TABS: 1.0000 | ORAL_TABLET | Freq: Every day | ORAL | Status: DC

## 2012-02-02 MED ORDER — THROMBIN 20000 UNITS EX SOLR
CUTANEOUS | Status: DC | PRN
  Administered 2012-02-02: 09:00:00 via TOPICAL

## 2012-02-02 MED ORDER — DIPHENHYDRAMINE HCL 25 MG PO TABS
25.0000 mg | ORAL_TABLET | Freq: Four times a day (QID) | ORAL | Status: DC | PRN
  Filled 2012-02-02: qty 1

## 2012-02-02 MED ORDER — LOSARTAN POTASSIUM-HCTZ 50-12.5 MG PO TABS: 1.0000 | ORAL_TABLET | Freq: Every day | ORAL | Status: DC

## 2012-02-02 MED ORDER — ONDANSETRON HCL 4 MG/2ML IJ SOLN: 4.0000 mg | INTRAMUSCULAR | Status: DC | PRN

## 2012-02-02 MED ORDER — ROCURONIUM BROMIDE 100 MG/10ML IV SOLN
INTRAVENOUS | Status: DC | PRN
  Administered 2012-02-02: 50 mg via INTRAVENOUS

## 2012-02-02 MED ORDER — INSULIN ASPART 100 UNIT/ML ~~LOC~~ SOLN: 0.0000 [IU] | Freq: Three times a day (TID) | SUBCUTANEOUS | Status: DC

## 2012-02-02 MED ORDER — MUPIROCIN 2 % EX OINT
TOPICAL_OINTMENT | CUTANEOUS | Status: AC
  Filled 2012-02-02: qty 22

## 2012-02-02 MED ORDER — ASPIRIN EC 81 MG PO TBEC
162.0000 mg | DELAYED_RELEASE_TABLET | Freq: Every day | ORAL | Status: DC
  Administered 2012-02-03 – 2012-02-04 (×2): 162 mg via ORAL
  Filled 2012-02-02 (×3): qty 2

## 2012-02-02 MED ORDER — METFORMIN HCL 500 MG PO TABS
500.0000 mg | ORAL_TABLET | Freq: Two times a day (BID) | ORAL | Status: DC
  Administered 2012-02-02 – 2012-02-04 (×4): 500 mg via ORAL
  Filled 2012-02-02 (×6): qty 1

## 2012-02-02 MED ORDER — SODIUM CHLORIDE 0.9 % IJ SOLN
3.0000 mL | INTRAMUSCULAR | Status: DC | PRN
  Administered 2012-02-03: 3 mL via INTRAVENOUS

## 2012-02-02 MED ORDER — LIDOCAINE HCL (CARDIAC) 20 MG/ML IV SOLN
INTRAVENOUS | Status: DC | PRN
  Administered 2012-02-02: 100 mg via INTRAVENOUS

## 2012-02-02 MED ORDER — LACTATED RINGERS IV SOLN
INTRAVENOUS | Status: DC | PRN
  Administered 2012-02-02 (×4): via INTRAVENOUS

## 2012-02-02 MED ORDER — ONDANSETRON HCL 4 MG/2ML IJ SOLN
INTRAMUSCULAR | Status: DC | PRN
  Administered 2012-02-02: 4 mg via INTRAVENOUS

## 2012-02-02 MED ORDER — ALBUMIN HUMAN 5 % IV SOLN
INTRAVENOUS | Status: DC | PRN
  Administered 2012-02-02: 09:00:00 via INTRAVENOUS

## 2012-02-02 MED ORDER — SODIUM CHLORIDE 0.9 % IR SOLN
Status: DC | PRN
  Administered 2012-02-02: 09:00:00

## 2012-02-02 MED ORDER — DEXTROSE 5 % IV SOLN
INTRAVENOUS | Status: DC | PRN
  Administered 2012-02-02: 13:00:00 via INTRAVENOUS

## 2012-02-02 MED ORDER — MUPIROCIN 2 % EX OINT
TOPICAL_OINTMENT | Freq: Two times a day (BID) | CUTANEOUS | Status: DC
  Administered 2012-02-02 (×2): via NASAL

## 2012-02-02 MED ORDER — HYDROMORPHONE HCL PF 1 MG/ML IJ SOLN
INTRAMUSCULAR | Status: AC
  Administered 2012-02-02: 0.5 mg via INTRAVENOUS
  Filled 2012-02-02: qty 1

## 2012-02-02 MED ORDER — LOSARTAN POTASSIUM 50 MG PO TABS
100.0000 mg | ORAL_TABLET | Freq: Every day | ORAL | Status: DC
  Administered 2012-02-03 – 2012-02-04 (×2): 100 mg via ORAL
  Filled 2012-02-02 (×3): qty 2

## 2012-02-02 MED ORDER — SENNA 8.6 MG PO TABS
1.0000 | ORAL_TABLET | Freq: Two times a day (BID) | ORAL | Status: DC
  Administered 2012-02-02 – 2012-02-03 (×3): 8.6 mg via ORAL
  Filled 2012-02-02 (×6): qty 1

## 2012-02-02 MED ORDER — HYDROMORPHONE HCL PF 1 MG/ML IJ SOLN
0.5000 mg | INTRAMUSCULAR | Status: DC | PRN
  Administered 2012-02-03: 1 mg via INTRAVENOUS
  Filled 2012-02-02: qty 1

## 2012-02-02 MED ORDER — HYDROMORPHONE HCL PF 1 MG/ML IJ SOLN
0.2500 mg | INTRAMUSCULAR | Status: DC | PRN
  Administered 2012-02-02 (×2): 0.5 mg via INTRAVENOUS

## 2012-02-02 MED ORDER — DEXTROSE 5 % IV SOLN
INTRAVENOUS | Status: DC | PRN
  Administered 2012-02-02: 08:00:00 via INTRAVENOUS

## 2012-02-02 MED ORDER — BACITRACIN 50000 UNITS IM SOLR
INTRAMUSCULAR | Status: AC
  Filled 2012-02-02: qty 1

## 2012-02-02 MED ORDER — SODIUM CHLORIDE 0.9 % IV SOLN
INTRAVENOUS | Status: DC | PRN
  Administered 2012-02-02: 13:00:00 via INTRAVENOUS

## 2012-02-02 MED ORDER — MENTHOL 3 MG MT LOZG: 1.0000 | LOZENGE | OROMUCOSAL | Status: DC | PRN

## 2012-02-02 MED ORDER — MORPHINE SULFATE 10 MG/ML IJ SOLN
INTRAMUSCULAR | Status: DC | PRN
  Administered 2012-02-02 (×3): 2 mg via INTRAVENOUS

## 2012-02-02 MED ORDER — PROPOFOL 10 MG/ML IV BOLUS
INTRAVENOUS | Status: DC | PRN
  Administered 2012-02-02: 200 mg via INTRAVENOUS

## 2012-02-02 MED ORDER — FENTANYL CITRATE 0.05 MG/ML IJ SOLN
INTRAMUSCULAR | Status: DC | PRN
  Administered 2012-02-02 (×4): 50 ug via INTRAVENOUS
  Administered 2012-02-02: 150 ug via INTRAVENOUS
  Administered 2012-02-02 (×5): 50 ug via INTRAVENOUS

## 2012-02-02 SURGICAL SUPPLY — 68 items
80mm Prebent Rod ×4 IMPLANT
BAG DECANTER FOR FLEXI CONT (MISCELLANEOUS) ×2 IMPLANT
BENZOIN TINCTURE PRP APPL 2/3 (GAUZE/BANDAGES/DRESSINGS) ×2 IMPLANT
BLADE SURG ROTATE 9660 (MISCELLANEOUS) ×2 IMPLANT
BONE MATRIX OSTEOCEL PLUS 10CC (Bone Implant) ×2 IMPLANT
BUR MATCHSTICK NEURO 3.0 LAGG (BURR) ×2 IMPLANT
CAGE COROENT MP 8X23 (Cage) ×4 IMPLANT
CAGE COROENT PLIF 10X28-8 LUMB (Cage) ×4 IMPLANT
CANISTER SUCTION 2500CC (MISCELLANEOUS) ×2 IMPLANT
CLIP NEUROVISION LG (CLIP) ×2 IMPLANT
CLOTH BEACON ORANGE TIMEOUT ST (SAFETY) ×2 IMPLANT
CONT SPEC 4OZ CLIKSEAL STRL BL (MISCELLANEOUS) ×4 IMPLANT
COVER BACK TABLE 24X17X13 BIG (DRAPES) IMPLANT
COVER TABLE BACK 60X90 (DRAPES) ×2 IMPLANT
DRAPE C-ARM 42X72 X-RAY (DRAPES) ×4 IMPLANT
DRAPE C-ARMOR (DRAPES) ×2 IMPLANT
DRAPE LAPAROTOMY 100X72X124 (DRAPES) ×2 IMPLANT
DRAPE POUCH INSTRU U-SHP 10X18 (DRAPES) ×2 IMPLANT
DRAPE SURG 17X23 STRL (DRAPES) ×2 IMPLANT
DRESSING TELFA 8X3 (GAUZE/BANDAGES/DRESSINGS) ×2 IMPLANT
DRSG OPSITE 4X5.5 SM (GAUZE/BANDAGES/DRESSINGS) ×2 IMPLANT
DURAPREP 26ML APPLICATOR (WOUND CARE) ×4 IMPLANT
ELECT REM PT RETURN 9FT ADLT (ELECTROSURGICAL) ×2
ELECTRODE REM PT RTRN 9FT ADLT (ELECTROSURGICAL) ×1 IMPLANT
EVACUATOR 1/8 PVC DRAIN (DRAIN) ×2 IMPLANT
GAUZE SPONGE 4X4 16PLY XRAY LF (GAUZE/BANDAGES/DRESSINGS) IMPLANT
GLOVE BIO SURGEON STRL SZ8 (GLOVE) ×4 IMPLANT
GLOVE BIOGEL PI IND STRL 7.5 (GLOVE) ×1 IMPLANT
GLOVE BIOGEL PI IND STRL 8 (GLOVE) ×3 IMPLANT
GLOVE BIOGEL PI INDICATOR 7.5 (GLOVE) ×1
GLOVE BIOGEL PI INDICATOR 8 (GLOVE) ×3
GLOVE ECLIPSE 7.5 STRL STRAW (GLOVE) ×14 IMPLANT
GOWN BRE IMP SLV AUR LG STRL (GOWN DISPOSABLE) IMPLANT
GOWN BRE IMP SLV AUR XL STRL (GOWN DISPOSABLE) ×4 IMPLANT
GOWN STRL REIN 2XL LVL4 (GOWN DISPOSABLE) ×4 IMPLANT
HEMOSTAT POWDER KIT SURGIFOAM (HEMOSTASIS) IMPLANT
KIT BASIN OR (CUSTOM PROCEDURE TRAY) ×2 IMPLANT
KIT NEEDLE NVM5 EMG ELECT (KITS) ×1 IMPLANT
KIT NEEDLE NVM5 EMG ELECTRODE (KITS) ×1
KIT ROOM TURNOVER OR (KITS) ×2 IMPLANT
MAAS PLIF         Shank 5.5x40 ×4 IMPLANT
MILL MEDIUM DISP (BLADE) ×2 IMPLANT
NEEDLE HYPO 25X1 1.5 SAFETY (NEEDLE) ×2 IMPLANT
NS IRRIG 1000ML POUR BTL (IV SOLUTION) ×2 IMPLANT
NVM5 PROBE ×2 IMPLANT
PACK LAMINECTOMY NEURO (CUSTOM PROCEDURE TRAY) ×2 IMPLANT
PAD ARMBOARD 7.5X6 YLW CONV (MISCELLANEOUS) ×10 IMPLANT
SCREW LOCK (Screw) ×8 IMPLANT
SCREW LOCK FXNS SPNE MAS PL (Screw) ×8 IMPLANT
SCREW MAS PLIF 5.5X30 (Screw) ×2 IMPLANT
SCREW PAS PLIF 5X30 (Screw) ×2 IMPLANT
SCREW SHANK 5.0X30MM (Screw) ×4 IMPLANT
SCREW SHANK 5.0X35 (Screw) ×4 IMPLANT
SCREW TULIP 5.5 (Screw) ×8 IMPLANT
SPONGE LAP 4X18 X RAY DECT (DISPOSABLE) IMPLANT
SPONGE SURGIFOAM ABS GEL 100 (HEMOSTASIS) ×2 IMPLANT
STRIP CLOSURE SKIN 1/2X4 (GAUZE/BANDAGES/DRESSINGS) ×2 IMPLANT
SUT VIC AB 0 CT1 18XCR BRD8 (SUTURE) ×1 IMPLANT
SUT VIC AB 0 CT1 8-18 (SUTURE) ×1
SUT VIC AB 2-0 CP2 18 (SUTURE) ×4 IMPLANT
SUT VIC AB 3-0 SH 8-18 (SUTURE) ×4 IMPLANT
SYR 20ML ECCENTRIC (SYRINGE) ×2 IMPLANT
THROMBIN 5000 UNITS IMPLANT
TOWEL OR 17X24 6PK STRL BLUE (TOWEL DISPOSABLE) ×2 IMPLANT
TOWEL OR 17X26 10 PK STRL BLUE (TOWEL DISPOSABLE) ×2 IMPLANT
TRAP SPECIMEN MUCOUS 40CC (MISCELLANEOUS) ×2 IMPLANT
TRAY FOLEY CATH 14FRSI W/METER (CATHETERS) ×2 IMPLANT
WATER STERILE IRR 1000ML POUR (IV SOLUTION) ×2 IMPLANT

## 2012-02-02 NOTE — Progress Notes (Signed)
Utilization review completed.  

## 2012-02-02 NOTE — Anesthesia Procedure Notes (Signed)
Procedure Name: Intubation Date/Time: 02/02/2012 7:40 AM Performed by: Wray Kearns A Pre-anesthesia Checklist: Patient identified, Timeout performed, Emergency Drugs available, Suction available and Patient being monitored Patient Re-evaluated:Patient Re-evaluated prior to inductionOxygen Delivery Method: Circle system utilized Preoxygenation: Pre-oxygenation with 100% oxygen Intubation Type: IV induction and Cricoid Pressure applied Ventilation: Mask ventilation without difficulty Laryngoscope Size: 4 and Mac Grade View: Grade I Tube type: Oral Tube size: 8.0 mm Number of attempts: 1 Airway Equipment and Method: Stylet and LTA kit utilized Placement Confirmation: ETT inserted through vocal cords under direct vision,  positive ETCO2,  CO2 detector and breath sounds checked- equal and bilateral Secured at: 23 cm Tube secured with: Tape Dental Injury: Teeth and Oropharynx as per pre-operative assessment

## 2012-02-02 NOTE — Anesthesia Preprocedure Evaluation (Signed)
Anesthesia Evaluation  Patient identified by MRN, date of birth, ID band Patient awake    Reviewed: Allergy & Precautions, H&P , NPO status , Patient's Chart, lab work & pertinent test results  Airway Mallampati: I TM Distance: >3 FB Neck ROM: full    Dental   Pulmonary          Cardiovascular hypertension, Rhythm:regular Rate:Normal     Neuro/Psych PSYCHIATRIC DISORDERS Depression    GI/Hepatic   Endo/Other  diabetes, Type 2, Oral Hypoglycemic Agents  Renal/GU Renal disease     Musculoskeletal   Abdominal   Peds  Hematology   Anesthesia Other Findings   Reproductive/Obstetrics                           Anesthesia Physical Anesthesia Plan  ASA: II  Anesthesia Plan: General   Post-op Pain Management:    Induction: Intravenous  Airway Management Planned: Oral ETT  Additional Equipment:   Intra-op Plan:   Post-operative Plan: Extubation in OR  Informed Consent: I have reviewed the patients History and Physical, chart, labs and discussed the procedure including the risks, benefits and alternatives for the proposed anesthesia with the patient or authorized representative who has indicated his/her understanding and acceptance.     Plan Discussed with: CRNA, Anesthesiologist and Surgeon  Anesthesia Plan Comments:         Anesthesia Quick Evaluation

## 2012-02-02 NOTE — Progress Notes (Signed)
Patient arrived to 4N24 in no signs of acute distress. Bed alarm on. Will monitor.

## 2012-02-02 NOTE — Op Note (Signed)
02/02/2012  1:55 PM  PATIENT:  Matthew [name]  71 y.o. male  PRE-OPERATIVE DIAGNOSIS:  1. Lytic spondylolisthesis L5-S1 with stenosis, 2. 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 Decompressive lumbar laminectomy L3-4 L4-5 and L5-S1 requiring more work than would be required of the typical PLIF procedure in order to adequately decompress the neural elements and address his severe spinal stenosis.  2. Posterior lumbar interbody fusion L3-4 and L5-S1 using a PEEK interbody cage packed with morcellized allograft and autograft.  3. Posterior fixation L3-S1 using Nuvasive cortical pedicle screws  4. Intertransverse arthrodesis L3-4 L4-5 using morcellized autograft and allograft.  SURGEON:  Marikay Alar, MD  ASSISTANTS: Dr. Jeral Fruit  ANESTHESIA:  General  EBL: 400 ml  Total I/O In: 4480 [I.V.:3850; Blood:130; IV Piggyback:500] Out: 1100 [Urine:700; Blood:400]  BLOOD ADMINISTERED:130 CC CELLSAVER  DRAINS: Hemovac   INDICATION FOR PROCEDURE: This patient presented with severe back pain radiating into the buttocks and legs. MRI and CT scan showed severe degenerative disc disease and spondylolisthesis and stenosis. He tried medical management without relief. Recommended decompressive laminectomy and instrument fusion from L3-S1.  Patient understood the risks, benefits, and alternatives and potential outcomes and wished to proceed.  PROCEDURE DETAILS:  The patient was brought to the operating room. After induction of generalized endotracheal anesthesia the patient was rolled into the prone position on chest rolls and all pressure points were padded. The patient's lumbar region was cleaned with Hibiclens and then prepped with DuraPrep and draped in the usual sterile fashion. Anesthesia was injected and then a dorsal midline incision was made and carried down to the lumbosacral fascia. The fascia  was opened and the paraspinous musculature was taken down in a subperiosteal fashion to expose L3-4 L4-5 L5-S1. Intraoperative fluoroscopy confirmed my level, and then I placed my pedicle screws at L3 and L4 bilaterally. The pedicle screw entry zones were verified utilizing AP and lateral fluoroscopy and then I drilled and tapped in an upward and outward direction to place cortical pedicle screws. EMG monitoring was used. I drilled and then tapped and then placed 5-0 by 35 mm pedicle screws in L3 and L4 bilaterally. I then turned my attention to the decompression and the spinous process was removed and complete lumbar laminectomies, hemi- facetectomies, and foraminotomies were performed at L3-4 L4-5 and L5-S1. The yellow ligament was removed to expose the underlying dura and nerve roots, and generous foraminotomies were performed to adequately decompress the neural elements. Severe spinal stenosis was encountered at L3-4 and L4-5 significant lateral recess stenosis was encountered at L5-S1. Once the decompression was complete, I turned my attention to the posterior lower lumbar interbody fusion. The epidural venous vasculature was coagulated and cut sharply. Disc space was incised at L3-4 and L5-S1 and the initial discectomy was performed with pituitary rongeurs. The disc space was distracted with sequential distractors to a height of 8 mm at L5-S1 and 12 mm at L3-4. We then used a series of scrapers and shavers to prepare the endplates for fusion. The midline was prepared with Epstein curettes. Once the complete discectomy was finished, we packed an appropriate sized peek interbody cage with local autograft and morcellized allograft, gently retracted the nerve root, and tapped the cage into position at L3-4 and L5-S1 bilaterally.  The midline was packed with morselized autograft and allograft. We then turned our attention to the posterior fixation at L5 and S1. The cortical  pedicle screw entry zones were identified  utilizing surface landmarks and fluoroscopy. We probed each pedicle with the pedicle probe and tapped each pedicle with the appropriate tap in a medial to lateral direction. We palpated with a ball probe to assure no break in the cortex. We then placed 5 5 x 40 mm pedicle screws into the pedicles bilaterally at S1, and 50 by 35 mm pedicle screws into the L5 pedicles. We then decorticated the transverse processes and laid a mixture of morcellized autograft and allograft out over these to perform intertransverse arthrodesis at L3-4 L4-5. We then placed lordotic rods into the multiaxial screw heads of the pedicle screws and locked these in position with the locking caps and anti-torque device. We then checked our construct with AP and lateral fluoroscopy. Irrigated with copious amounts of bacitracin-containing saline solution. Placed a medium Hemovac drain through separate stab incision. Inspected the nerve roots once again to assure adequate decompression, lined to the dura with Gelfoam, and closed the muscle and the fascia with 0 Vicryl. Closed the subcutaneous tissues with 2-0 Vicryl and subcuticular tissues with 3-0 Vicryl. The skin was closed with benzoin and Steri-Strips. Dressing was then applied, the patient was awakened from general anesthesia and transported to the recovery room in stable condition. At the end of the procedure all sponge, needle and instrument counts were correct.   PLAN OF CARE: Admit to inpatient  PATIENT DISPOSITION:  PACU - hemodynamically stable.   Delay start of Pharmacological VTE agent (>24hrs) due to surgical blood loss or risk of bleeding:  yes

## 2012-02-02 NOTE — Anesthesia Postprocedure Evaluation (Signed)
  Anesthesia Post-op Note  Patient: Matthew [name]  Procedure(s) Performed: Procedure(s) (LRB) with comments: POSTERIOR LUMBAR FUSION 2 LEVEL (N/A) - Lumbar three-four,Lumbar five-Sacral One posterior lumbar interbody fusion with instrumentation from lumbar three-Sacral One  Patient Location: PACU  Anesthesia Type:General  Level of Consciousness: awake, oriented and patient cooperative  Airway and Oxygen Therapy: Patient Spontanous Breathing  Post-op Pain: mild  Post-op Assessment: Post-op Vital signs reviewed, Patient's Cardiovascular Status Stable, Respiratory Function Stable, Patent Airway, No signs of Nausea or vomiting and Pain level controlled  Post-op Vital Signs: stable  Complications: No apparent anesthesia complications

## 2012-02-02 NOTE — OR Nursing (Signed)
Neuro Monitoring per Nuvasive. 

## 2012-02-02 NOTE — Preoperative (Signed)
Beta Blockers   Reason not to administer Beta Blockers:Not Applicable 

## 2012-02-02 NOTE — H&P (Signed)
Subjective: Patient is a 71 y.o. male admitted for lumbar decompression and fusion. Onset of symptoms was months ago, progressively worse since that time.  The pain is rated severe, and is located at the low back and radiates to legs. The pain is described as aching and occurs with movement. The symptoms have been progressive. Symptoms are exacerbated by standing, walking . MRI or CT showed multilevel DDD, spondylolisthesis, stenosis.  Past Medical History  Diagnosis Date  . Diabetes mellitus   . Complication of anesthesia     "Too much with shoulder surgery",  Not enough for  Right ,  Left knee  just right.  . Hypertension   . Depression   . Dysrhythmia     has been irregular at times since age 71.  . Constipation   . Arthritis     Shoulder  . Kidney stones 2011    multiple times.  . Chronic kidney disease     Stage II, per Dr. Donette Larry notes 12/2011    Past Surgical History  Procedure Date  . Knee surgery 2009    Right  . Shoulder surgery   . Medial partial knee replacement 2010    Left  . Eye surgery     Cataract Left eye  . Fisture      x 2     Prior to Admission medications   Medication Sig Start Date End Date Taking? Authorizing Provider  aspirin 325 MG EC tablet Take 162 mg by mouth daily.    Yes Historical Provider, MD  Cholecalciferol (VITAMIN D) 1000 UNITS capsule Take 1,000 Units by mouth daily.     Yes Historical Provider, MD  diphenhydrAMINE (BENADRYL) 25 MG tablet Take 25 mg by mouth every 6 (six) hours as needed. Takes whenever he takes oxycodone.   Yes Historical Provider, MD  fish oil-omega-3 fatty acids 1000 MG capsule Take 2 g by mouth daily.     Yes Historical Provider, MD  glimepiride (AMARYL) 2 MG tablet Take 2 mg by mouth daily before breakfast.     Yes Historical Provider, MD  losartan-hydrochlorothiazide (HYZAAR) 50-12.5 MG per tablet Take 1 tablet by mouth daily.   Yes Historical Provider, MD  metFORMIN (GLUCOPHAGE) 500 MG tablet Take 500 mg by mouth  2 (two) times daily with a meal.     Yes Historical Provider, MD  naproxen sodium (ANAPROX) 220 MG tablet Take 440 mg by mouth 2 (two) times daily as needed. pain   Yes Historical Provider, MD  oxyCODONE-acetaminophen (PERCOCET) 7.5-325 MG per tablet Take 1 tablet by mouth every 6 (six) hours as needed. Pain.   Yes Historical Provider, MD  rosuvastatin (CRESTOR) 5 MG tablet Take 5 mg by mouth daily.     Yes Historical Provider, MD  losartan-hydrochlorothiazide (HYZAAR) 100-12.5 MG per tablet Take 1 tablet by mouth daily.    Historical Provider, MD   Allergies  Allergen Reactions  . Codeine Itching  . Morphine And Related Itching    History  Substance Use Topics  . Smoking status: Never Smoker   . Smokeless tobacco: Never Used  . Alcohol Use: No    No family history on file.   Review of Systems  Positive ROS: neg  All other systems have been reviewed and were otherwise negative with the exception of those mentioned in the HPI and as above.  Objective: Vital signs in last 24 hours:    General Appearance: Alert, cooperative, no distress, appears stated age Head: Normocephalic, without obvious abnormality, atraumatic  Eyes: PERRL, conjunctiva/corneas clear, EOM's intact Throat: Lips, mucosa, and tongue normal; teeth and gums normal Neck: Supple, symmetrical, trachea midline Lungs:  respirations unlabored Heart: Regular rate and rhythm Abdomen: Soft, non-tender Extremities: Extremities normal, atraumatic, no cyanosis or edema Pulses: 2+ and symmetric all extremities Skin: Skin color, texture, turgor normal, no rashes or lesions  NEUROLOGIC:   Mental status: Alert and oriented x4,  no aphasia, good attention span, fund of knowledge, and memory Motor Exam - grossly normal Sensory Exam - grossly normal Reflexes: 1+ Coordination - grossly normal Gait - grossly normal Balance - grossly normal Cranial Nerves: I: smell Not tested  II: visual acuity  OS: nl    OD: nl  II:  visual fields Full to confrontation  II: pupils Equal, round, reactive to light  III,VII: ptosis None  III,IV,VI: extraocular muscles  Full ROM  V: mastication Normal  V: facial light touch sensation  Normal  V,VII: corneal reflex  Present  VII: facial muscle function - upper  Normal  VII: facial muscle function - lower Normal  VIII: hearing Not tested  IX: soft palate elevation  Normal  IX,X: gag reflex Present  XI: trapezius strength  5/5  XI: sternocleidomastoid strength 5/5  XI: neck flexion strength  5/5  XII: tongue strength  Normal    Data Review Lab Results  Component Value Date   WBC 6.3 01/25/2012   HGB 14.4 01/25/2012   HCT 42.9 01/25/2012   MCV 89.9 01/25/2012   PLT 219 01/25/2012   Lab Results  Component Value Date   NA 141 01/25/2012   K 4.3 01/25/2012   CL 101 01/25/2012   CO2 28 01/25/2012   BUN 18 01/25/2012   CREATININE 1.11 01/25/2012   GLUCOSE 158* 01/25/2012   No results found for this basename: INR, PROTIME    Assessment/Plan: Patient admitted for lumbar decompression and fusion L3-S1. Patient has failed conservative therapy.  I explained the condition and procedure to the patient and answered any questions.  Patient wishes to proceed with procedure as planned. Understands risks/ benefits and typical outcomes of procedure.   Matthew [name] S 02/02/2012 6:12 AM

## 2012-02-02 NOTE — Transfer of Care (Signed)
Immediate Anesthesia Transfer of Care Note  Patient: Rudolfo Brandow  Procedure(s) Performed: Procedure(s) (LRB) with comments: POSTERIOR LUMBAR FUSION 2 LEVEL (N/A) - Lumbar three-four,Lumbar five-Sacral One posterior lumbar interbody fusion with instrumentation from lumbar three-Sacral One  Patient Location: PACU  Anesthesia Type:General  Level of Consciousness: sedated, patient cooperative and responds to stimulation  Airway & Oxygen Therapy: Patient Spontanous Breathing and Patient connected to face mask oxygen  Post-op Assessment: Report given to PACU RN, Post -op Vital signs reviewed and stable, Patient moving all extremities and Patient moving all extremities X 4  Post vital signs: Reviewed and stable  Complications: No apparent anesthesia complications

## 2012-02-03 LAB — GLUCOSE, CAPILLARY
Glucose-Capillary: 279 mg/dL — ABNORMAL HIGH (ref 70–99)
Glucose-Capillary: 182 mg/dL — ABNORMAL HIGH (ref 70–99)
Glucose-Capillary: 221 mg/dL — ABNORMAL HIGH (ref 70–99)
Glucose-Capillary: 176 mg/dL — ABNORMAL HIGH (ref 70–99)

## 2012-02-03 MED ORDER — MAGNESIUM CITRATE PO SOLN
1.0000 | Freq: Once | ORAL | Status: AC
  Administered 2012-02-03: 1 via ORAL
  Filled 2012-02-03: qty 296

## 2012-02-03 NOTE — Evaluation (Addendum)
Occupational Therapy Evaluation Patient Details Name: Matthew [name] MRN: [id number] DOB: 1940/02/11 Today's Date: 02/03/2012 Time: 7846-9629 OT Time Calculation (min): 31 min  OT Assessment / Plan / Recommendation Clinical Impression  Pt s/p L3-S1 PLIF and doing very well after sx. Pt will benefit from skilled OT in the acute setting to maximize I with ADL and ADL mobility prior to d/c. No DME needs or f/u OT indicated.     OT Assessment  Patient needs continued OT Services    Follow Up Recommendations  No OT follow up;Supervision - Intermittent    Barriers to Discharge      Equipment Recommendations  None recommended by OT    Recommendations for Other Services    Frequency  Min 2X/week    Precautions / Restrictions Precautions Precautions: Back Restrictions Weight Bearing Restrictions: No   Pertinent Vitals/Pain Pt reports soreness but denies any pain.     ADL  Grooming: Wash/dry hands;Supervision/safety Where Assessed - Grooming: Unsupported standing Upper Body Bathing: Set up Where Assessed - Upper Body Bathing: Unsupported sitting Lower Body Bathing: Min guard Where Assessed - Lower Body Bathing: Unsupported sit to stand Lower Body Dressing: Moderate assistance Where Assessed - Lower Body Dressing: Unsupported sit to stand Toilet Transfer: Supervision/safety Toilet Transfer Method: Sit to Barista: Materials engineer and Hygiene: Minimal assistance Where Assessed - Engineer, mining and Hygiene: Standing Tub/Shower Transfer: Insurance risk surveyor Method: Science writer: Shower seat with back;Walk in shower Equipment Used: Back brace;Rolling walker ADL Comments:  Pt states he has access to RW, 3n1 and shower seat- encouraged pt to use this initially at d/c.     OT Diagnosis: Generalized weakness;Acute pain  OT Problem List: Decreased activity  tolerance;Decreased knowledge of use of DME or AE;Pain;Decreased knowledge of precautions OT Treatment Interventions: Self-care/ADL training;DME and/or AE instruction;Patient/family education;Therapeutic activities   OT Goals Acute Rehab OT Goals OT Goal Formulation: With patient Time For Goal Achievement: 02/10/12 Potential to Achieve Goals: Good ADL Goals Pt Will Perform Lower Body Dressing: Sit to stand from chair;Sit to stand from bed;with modified independence;with adaptive equipment ADL Goal: Lower Body Dressing - Progress: Goal set today Pt Will Perform Tub/Shower Transfer: Shower transfer;with modified independence;Ambulation;with DME ADL Goal: Tub/Shower Transfer - Progress: Goal set today Additional ADL Goal #1: Pt will I'ly verbalize/generalize 3/3 back precautions for use with ADLs. ADL Goal: Additional Goal #1 - Progress: Goal set today  Visit Information  Last OT Received On: 02/03/12 Assistance Needed: +1    Subjective Data  Subjective: I don't hurt at all Patient Stated Goal: Return home    Prior Functioning     Home Living Lives With: Spouse Available Help at Discharge: Family;Available 24 hours/day Type of Home: Mobile home Home Access: Stairs to enter Entrance Stairs-Number of Steps: 1 Entrance Stairs-Rails: None Home Layout: One level Bathroom Shower/Tub: Walk-in shower;Door [employer] Toilet: Standard Home Adaptive Equipment: Walker - rolling;Bedside commode/3-in-1;Shower chair with back;Straight cane Prior Function Level of Independence: Independent Able to Take Stairs?: Reciprically Driving: Yes 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 Communication Communication: No difficulties Dominant Hand: Right         Vision/Perception     Cognition  Overall Cognitive Status: Appears within functional limits for tasks assessed/performed Arousal/Alertness: Awake/alert Orientation Level: Appears intact for tasks assessed Behavior  During Session: [facility] for tasks performed    Extremity/Trunk Assessment Right Upper Extremity Assessment RUE ROM/Strength/Tone: [facility] for tasks assessed Left Upper Extremity  Assessment LUE ROM/Strength/Tone: [facility] for tasks assessed     Mobility Bed Mobility Bed Mobility: Rolling Right;Right Sidelying to Sit;Sitting - Scoot to Edge of Bed Rolling Right: 5: Supervision Right Sidelying to Sit: 5: Supervision;HOB flat     Shoulder Instructions     Exercise     Balance     End of Session OT - End of Session Equipment Utilized During Treatment: Back brace Activity Tolerance: Patient tolerated treatment well Patient left: in chair;with call bell/phone within reach;with family/visitor present Nurse Communication: Mobility status  GO     Miesha Bachmann 02/03/2012, 10:17 AM

## 2012-02-03 NOTE — Progress Notes (Signed)
Pt ambulated in room, steady gait, c/o numbness in L foot.  Pt tolerated ambulation well, back to bed without incidence.  Will continue to monitor.

## 2012-02-03 NOTE — Progress Notes (Signed)
Patient ID: Matthew [name], male   DOB: 1940-11-22, 71 y.o.   MRN: [id number] Patient is doing extremely well. He really denies any back or leg pain. He does complain of some left foot numbness but has good dorsi and plantar flexion strength. He did extremely well with therapy today ambulating in the halls at a rapid pace and even did stairs on postop day 1. Really doing quite well. Potentially could go home tomorrow.

## 2012-02-03 NOTE — Progress Notes (Signed)
Pt.'s drain with only 5cc out this shift; dsg saturated- reinforced. Dr. Yetta Barre notified, order received to d/c hemovac. Will monitor.

## 2012-02-03 NOTE — Progress Notes (Signed)
OT Note:  02/03/12 1200  OT Visit Information  Last OT Received On 02/03/12  Assistance Needed +1  OT Time Calculation  OT Start Time 1000  OT Stop Time 1016  OT Time Calculation (min) 16 min  Precautions  Precautions Back  ADL  ADL Comments Pt unable to don/doff socks or pants I'ly. Educated pt on AE and LB ADL adaptations. Pt declined practice with equipment. D/w wife and pt where to purchase various equipment, especially toileting aid.   Cognition  Overall Cognitive Status Appears within functional limits for tasks assessed/performed (Simultaneous filing. User may not have seen previous data.)  Arousal/Alertness Awake/alert (Simultaneous filing. User may not have seen previous data.)  Orientation Level Appears intact for tasks assessed (Simultaneous filing. User may not have seen previous data.)  Behavior During Session [facility] for tasks performed (Simultaneous filing. User may not have seen previous data.)  OT - End of Session  Equipment Utilized During Treatment Back brace  Activity Tolerance Patient tolerated treatment well  Patient left in chair;with call bell/phone within reach;with family/visitor present  Nurse Communication Mobility status  OT Assessment/Plan  Comments on Treatment Session Session focused on AE/ADL adaptation education.  OT Plan Discharge plan remains appropriate  Follow Up Recommendations No OT follow up;Supervision - Intermittent  OT Equipment None recommended by OT  ADL Goals  Pt Will Perform Lower Body Dressing Sit to stand from chair;Sit to stand from bed;with modified independence;with adaptive equipment  ADL Goal: Lower Body Dressing - Progress Progressing toward goals  Additional ADL Goal #1 Pt will I'ly verbalize/generalize 3/3 back precautions for use with ADLs.  ADL Goal: Additional Goal #1 - Progress Progressing toward goals  OT General Charges  $OT Visit 1 Procedure  OT Treatments  $Self Care/Home Management  8-22 mins

## 2012-02-03 NOTE — Evaluation (Signed)
Physical Therapy Evaluation Patient Details Name: Matthew [name] MRN: [id number] DOB: 1940-06-01 Today's Date: 02/03/2012 Time: 4098-1191 PT Time Calculation (min): 29 min  PT Assessment / Plan / Recommendation Clinical Impression  Pt s/p PLF L3-S1. Pt moving well, completed steps and ambulation with supervision/minguard this session. Plan to d/c home tomorrow    PT Assessment  Patient needs continued PT services    Follow Up Recommendations  No PT follow up;Supervision for mobility/OOB    Does the patient have the potential to tolerate intense rehabilitation      Barriers to Discharge        Equipment Recommendations  None recommended by PT    Recommendations for Other Services     Frequency Min 5X/week    Precautions / Restrictions Precautions Precautions: Back   Pertinent Vitals/Pain Minimal pain noted. Pain meds given prior to treatment      Mobility  Bed Mobility Bed Mobility: Rolling Right;Right Sidelying to Sit;Sitting - Scoot to Edge of Bed Rolling Right: 5: Supervision Right Sidelying to Sit: 5: Supervision;HOB flat Details for Bed Mobility Assistance: Cues for safe hand placement  Transfers Transfers: Sit to Stand;Stand to Sit Sit to Stand: 4: Min guard;With upper extremity assist;From bed Stand to Sit: 4: Min guard;With upper extremity assist;To chair/3-in-1 Details for Transfer Assistance: Cues for safe hand placement to/from RW. Minguard for safety Ambulation/Gait Ambulation/Gait Assistance: 4: Min guard Ambulation Distance (Feet): 150 Feet Assistive device: Rolling walker Ambulation/Gait Assistance Details: Minguard for safety only. Cues for safe distance to RW for increased support. Gait Pattern: Step-to pattern;Decreased stride length Gait velocity: slow Stairs: Yes Stairs Assistance: 5: Supervision Stairs Assistance Details (indicate cue type and reason): VC for safe technique with RW Stair Management Technique: No rails;Backwards;With  walker;Step to pattern Number of Stairs: 2     Shoulder Instructions     Exercises     PT Diagnosis: Abnormality of gait  PT Problem List: Decreased activity tolerance;Decreased mobility;Decreased knowledge of use of DME;Decreased safety awareness;Decreased knowledge of precautions;Pain PT Treatment Interventions: DME instruction;Gait training;Stair training;Functional mobility training;Therapeutic activities;Patient/family education   PT Goals Acute Rehab PT Goals PT Goal Formulation: With patient Time For Goal Achievement: 02/10/12 Potential to Achieve Goals: Good Pt will go Supine/Side to Sit: with modified independence PT Goal: Supine/Side to Sit - Progress: Goal set today Pt will go Sit to Supine/Side: with modified independence PT Goal: Sit to Supine/Side - Progress: Goal set today Pt will go Sit to Stand: with modified independence PT Goal: Sit to Stand - Progress: Goal set today Pt will go Stand to Sit: with modified independence PT Goal: Stand to Sit - Progress: Goal set today Pt will Transfer Bed to Chair/Chair to Bed: with modified independence PT Transfer Goal: Bed to Chair/Chair to Bed - Progress: Goal set today Pt will Ambulate: >150 feet;with modified independence;with least restrictive assistive device PT Goal: Ambulate - Progress: Goal set today Pt will Go Up / Down Stairs: 1-2 stairs;with modified independence;with least restrictive assistive device PT Goal: Up/Down Stairs - Progress: Goal set today  Visit Information  Last PT Received On: 02/03/12 Assistance Needed: +1    Subjective Data      Prior Functioning  Home Living Lives With: Spouse Available Help at Discharge: Family;Available 24 hours/day Type of Home: Mobile home Home Access: Stairs to enter Entrance Stairs-Number of Steps: 1 Entrance Stairs-Rails: None Home Layout: One level Bathroom Shower/Tub: Walk-in shower;Door [employer] Toilet: Standard Home Adaptive Equipment: Walker -  rolling;Bedside commode/3-in-1;Shower chair with back;Straight cane Prior  Function Level of Independence: Independent Able to Take Stairs?: Reciprically Driving: Yes 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 Communication Communication: No difficulties Dominant Hand: Right    Cognition  Overall Cognitive Status: Appears within functional limits for tasks assessed/performed Arousal/Alertness: Awake/alert Orientation Level: Appears intact for tasks assessed Behavior During Session: [facility] for tasks performed    Extremity/Trunk Assessment Right Upper Extremity Assessment RUE ROM/Strength/Tone: [facility] for tasks assessed Left Upper Extremity Assessment LUE ROM/Strength/Tone: WFL for tasks assessed Right Lower Extremity Assessment RLE ROM/Strength/Tone: Within functional levels RLE Sensation: WFL - Light Touch Left Lower Extremity Assessment LLE ROM/Strength/Tone: Within functional levels LLE Sensation: WFL - Light Touch   Balance    End of Session PT - End of Session Equipment Utilized During Treatment: Gait belt Activity Tolerance: Patient tolerated treatment well Patient left: in chair;with call bell/phone within reach;with family/visitor present Nurse Communication: Mobility status  GP     Milana Kidney 02/03/2012, 12:32 PM  02/03/2012 Milana Kidney DPT PAGER: [phone] OFFICE: [phone]

## 2012-02-04 LAB — GLUCOSE, CAPILLARY: Glucose-Capillary: 195 mg/dL — ABNORMAL HIGH (ref 70–99)

## 2012-02-04 MED ORDER — CYCLOBENZAPRINE HCL 10 MG PO TABS: 10.0000 mg | ORAL_TABLET | Freq: Three times a day (TID) | ORAL | Status: DC | PRN

## 2012-02-04 MED ORDER — OXYCODONE-ACETAMINOPHEN 7.5-325 MG PO TABS: 1.0000 | ORAL_TABLET | Freq: Four times a day (QID) | ORAL | Status: DC | PRN

## 2012-02-04 MED ORDER — DEXAMETHASONE 2 MG PO TABS: 2.0000 mg | ORAL_TABLET | Freq: Four times a day (QID) | ORAL | Status: DC

## 2012-02-04 NOTE — Discharge Summary (Signed)
Physician Discharge Summary  Patient ID: Matthew [name] MRN: [id number] DOB/AGE: [birth year] 71 y.o.  Admit date: 02/02/2012 Discharge date: 02/04/2012  Admission Diagnoses:1. Lytic spondylolisthesis L5-S1 with stenosis, 2. 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 Lytic spondylolisthesis L5-S1 with stenosis, 2. 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 *    Discharged Condition: good  Hospital Course: pt admitted on day of surgery - underwent procedure below - has numbness still on R LE , but slightly improved - pain much improved - ambulating, voiding, +BM ,   Consults: None  Significant Diagnostic Studies: none  Treatments: surgery: 1. Decompressive lumbar laminectomy L3-4 L4-5 and L5-S1 requiring more work than would be required of the typical PLIF procedure in order to adequately decompress the neural elements and address his severe spinal stenosis.  2. Posterior lumbar interbody fusion L3-4 and L5-S1 using a PEEK interbody cage packed with morcellized allograft and autograft.  3. Posterior fixation L3-S1 using Nuvasive cortical pedicle screws  4. Intertransverse arthrodesis L3-4 L4-5 using morcellized autograft and allograft.   Discharge Exam: Blood pressure 137/78, pulse 73, temperature 97.6 F (36.4 C), temperature source Oral, resp. rate 20, height 5\' 9"  (1.753 m), weight 104.6 kg (230 lb 9.6 oz), SpO2 99.00%. Wound:sl draainage  Disposition: home     Medication List     As of 02/04/2012  9:10 AM    TAKE these medications         aspirin 325 MG EC tablet   Take 162 mg by mouth daily.      cyclobenzaprine 10 MG tablet   Commonly known as: FLEXERIL   Take 1 tablet (10 mg total) by mouth 3 (three) times daily as needed for muscle spasms.      dexamethasone 2 MG tablet   Commonly  known as: DECADRON   Take 1 tablet (2 mg total) by mouth every 6 (six) hours. 1 q 6 hours x 1 day, 1 q 8 hours x 2 days , 1 q 12 hours x 2 days then d/c      diphenhydrAMINE 25 MG tablet   Commonly known as: BENADRYL   Take 25 mg by mouth every 6 (six) hours as needed. Takes whenever he takes oxycodone.      fish oil-omega-3 fatty acids 1000 MG capsule   Take 2 g by mouth daily.      glimepiride 2 MG tablet   Commonly known as: AMARYL   Take 2 mg by mouth daily before breakfast.      losartan-hydrochlorothiazide 100-12.5 MG per tablet   Commonly known as: HYZAAR   Take 1 tablet by mouth daily.      metFORMIN 500 MG tablet   Commonly known as: GLUCOPHAGE   Take 500 mg by mouth 2 (two) times daily with a meal.      naproxen sodium 220 MG tablet   Commonly known as: ANAPROX   Take 440 mg by mouth 2 (two) times daily as needed. pain      oxyCODONE-acetaminophen 7.5-325 MG per tablet   Commonly known as: PERCOCET   Take 1-2 tablets by mouth every 6 (six) hours as needed. Pain.      rosuvastatin 5 MG tablet   Commonly known as: CRESTOR   Take 5 mg by mouth daily.      Vitamin D 1000 UNITS capsule  Take 1,000 Units by mouth daily.         Signed: Clydene Fake, MD 02/04/2012, 9:10 AM

## 2012-02-04 NOTE — Progress Notes (Signed)
Physical Therapy Treatment Patient Details Name: Matthew [name] MRN: [id number] DOB: 09/16/1940 Today's Date: 02/04/2012 Time: 1100-1110 PT Time Calculation (min): 10 min  PT Assessment / Plan / Recommendation Comments on Treatment Session  Pt making great progress toward goals today with incr mobilty (gait distance) and less assistance needed with mobiltiy.    Follow Up Recommendations  No PT follow up;Supervision for mobility/OOB           Equipment Recommendations  None recommended by PT       Frequency Min 5X/week   Plan Discharge plan remains appropriate;Frequency remains appropriate    Precautions / Restrictions Precautions Precautions: Back Required Braces or Orthoses: Spinal Brace Spinal Brace: Lumbar corset;Applied in sitting position (pt independent with brace management) Restrictions Weight Bearing Restrictions: No       Mobility  Transfers Sit to Stand: 6: Modified independent (Device/Increase time);From chair/3-in-1;With upper extremity assist;With armrests Stand to Sit: 6: Modified independent (Device/Increase time);To chair/3-in-1;With upper extremity assist;With armrests Details for Transfer Assistance: no cues or assistance needed. incr time needed Ambulation/Gait Ambulation/Gait Assistance: 5: Supervision Ambulation Distance (Feet): 400 Feet Assistive device: Rolling walker Gait Pattern: Within Functional Limits Stairs Assistance: 5: Supervision Stairs Assistance Details (indicate cue type and reason): assist to stabilize walker only. Spouse present and educated on how to hold walker for pt at home. Stair Management Technique: No rails;Backwards;With walker;Step to pattern Number of Stairs: 2         PT Goals Acute Rehab PT Goals PT Goal: Sit to Stand - Progress: Met PT Goal: Stand to Sit - Progress: Met PT Transfer Goal: Bed to Chair/Chair to Bed - Progress: Met PT Goal: Ambulate - Progress: Progressing toward goal PT Goal: Up/Down Stairs -  Progress: Progressing toward goal  Visit Information  Last PT Received On: 02/04/12 Assistance Needed: +1    Subjective Data  Subjective: No new complaints, agreeable to therapy before going home (has been discharged).   Cognition  Overall Cognitive Status: Appears within functional limits for tasks assessed/performed Arousal/Alertness: Awake/alert Orientation Level: Appears intact for tasks assessed Behavior During Session: [facility] for tasks performed       End of Session PT - End of Session Equipment Utilized During Treatment: Gait belt;Back brace Activity Tolerance: Patient tolerated treatment well Patient left: in chair;with call bell/phone within reach;with family/visitor present   GP     Sallyanne Kuster 02/04/2012, 11:17 AM  Sallyanne Kuster, PTA Office- [phone]

## 2012-02-06 MED FILL — Sodium Chloride IV Soln 0.9%: INTRAVENOUS | Qty: 2000 | Status: AC

## 2012-02-06 MED FILL — Heparin Sodium (Porcine) Inj 1000 Unit/ML: INTRAMUSCULAR | Qty: 30 | Status: AC

## 2012-02-06 MED FILL — Thrombin For Soln Kit 5000 Unit: CUTANEOUS | Qty: 1 | Status: AC

## 2012-11-26 NOTE — H&P (Signed)
Matthew [name] is an 72 y.o. male.    Chief Complaint: left shoulder pain and worsening function  HPI: Pt is a 72 y.o. male complaining of left shoulder pain for multiple years. Pain had continually increased since the beginning. X-rays in the clinic show end-stage arthritic changes of the left shoulder. Pt has tried various conservative treatments which have failed to alleviate their symptoms, including injections and therapy. Various options are discussed with the patient. Risks, benefits and expectations were discussed with the patient. Patient understand the risks, benefits and expectations and wishes to proceed with surgery.   PCP:  Georgann Housekeeper, MD  D/C Plans:  Home with HHPT  PMH: Past Medical History  Diagnosis Date  . Diabetes mellitus   . Complication of anesthesia     "Too much with shoulder surgery",  Not enough for  Right ,  Left knee  just right.  . Hypertension   . Depression   . Dysrhythmia     has been irregular at times since age 75.  . Constipation   . Arthritis     Shoulder  . Kidney stones 2011    multiple times.  . Chronic kidney disease     Stage II, per Dr. Donette Larry notes 12/2011    PSH: Past Surgical History  Procedure Laterality Date  . Knee surgery  2009    Right  . Shoulder surgery    . Medial partial knee replacement  2010    Left  . Eye surgery      Cataract Left eye  . Fisture       x 2   . Posterior lumbar fusion  02/02/2012    Social History:  reports that he has never smoked. He has never used smokeless tobacco. He reports that he does not drink alcohol or use illicit drugs.  Allergies:  Allergies  Allergen Reactions  . Codeine Itching  . Morphine And Related Itching    Medications: No current facility-administered medications for this encounter.   Current Outpatient Prescriptions  Medication Sig Dispense Refill  . aspirin 325 MG EC tablet Take 162 mg by mouth daily.       . Cholecalciferol (VITAMIN D) 1000 UNITS capsule  Take 1,000 Units by mouth daily.        . cyclobenzaprine (FLEXERIL) 10 MG tablet Take 1 tablet (10 mg total) by mouth 3 (three) times daily as needed for muscle spasms.  50 tablet  1  . dexamethasone (DECADRON) 2 MG tablet Take 1 tablet (2 mg total) by mouth every 6 (six) hours. 1 q 6 hours x 1 day, 1 q 8 hours x 2 days , 1 q 12 hours x 2 days then d/c  16 tablet  0  . diphenhydrAMINE (BENADRYL) 25 MG tablet Take 25 mg by mouth every 6 (six) hours as needed. Takes whenever he takes oxycodone.      . fish oil-omega-3 fatty acids 1000 MG capsule Take 2 g by mouth daily.        Marland Kitchen glimepiride (AMARYL) 2 MG tablet Take 2 mg by mouth daily before breakfast.        . losartan-hydrochlorothiazide (HYZAAR) 100-12.5 MG per tablet Take 1 tablet by mouth daily.      . metFORMIN (GLUCOPHAGE) 500 MG tablet Take 500 mg by mouth 2 (two) times daily with a meal.        . naproxen sodium (ANAPROX) 220 MG tablet Take 440 mg by mouth 2 (two) times daily as needed. pain      .  oxyCODONE-acetaminophen (PERCOCET) 7.5-325 MG per tablet Take 1-2 tablets by mouth every 6 (six) hours as needed. Pain.  51 tablet  0  . rosuvastatin (CRESTOR) 5 MG tablet Take 5 mg by mouth daily.          No results found for this or any previous visit (from the past 48 hour(s)). No results found.  ROS: Pain with rom of the left upper extremity  Physical Exam: Alert and oriented 72 y.o. male in no acute distress Cranial nerves 2-12 intact Cervical spine: full rom with no tenderness, nv intact distally Chest: active breath sounds bilaterally, no wheeze rhonchi or rales Heart: regular rate and rhythm, no murmur Abd: non tender non distended with active bowel sounds Hip is stable with rom  Left shoulder with mild guarding and mild loss or rom nv intact distally Strength of ER and IR 3.5/5  No rashes or edema  Assessment/Plan Assessment: left shoulder endstage osteoarthritis   Plan: Patient will undergo a left total shoulder vs  reverse total shoulder by Dr. Ranell Patrick at [facility]. Risks benefits and expectations were discussed with the patient. Patient understand risks, benefits and expectations and wishes to proceed.

## 2012-11-27 ENCOUNTER — Encounter (HOSPITAL_COMMUNITY): Payer: Self-pay | Admitting: Pharmacy Technician

## 2012-11-28 ENCOUNTER — Encounter (HOSPITAL_COMMUNITY)
Admission: RE | Admit: 2012-11-28 | Discharge: 2012-11-28 | Disposition: A | Discharge status: Home / Self Care | Payer: Medicare Other | Source: Ambulatory Visit | Attending: Orthopedic Surgery | Admitting: Orthopedic Surgery

## 2012-11-28 ENCOUNTER — Encounter (HOSPITAL_COMMUNITY): Payer: Self-pay

## 2012-11-28 DIAGNOSIS — Z01812 Encounter for preprocedural laboratory examination: Secondary | ICD-10-CM | POA: Insufficient documentation

## 2012-11-28 LAB — BASIC METABOLIC PANEL
BUN: 17 mg/dL (ref 6–23)
CO2: 28 mEq/L (ref 19–32)
Calcium: 9.2 mg/dL (ref 8.4–10.5)
Chloride: 98 mEq/L (ref 96–112)
Creatinine, Ser: 1.22 mg/dL (ref 0.50–1.35)
GFR calc Af Amer: 68 mL/min — ABNORMAL LOW (ref >90)
GFR calc non Af Amer: 58 mL/min — ABNORMAL LOW (ref >90)
Glucose, Bld: 196 mg/dL — ABNORMAL HIGH (ref 70–99)
Potassium: 4.3 mEq/L (ref 3.5–5.1)
Sodium: 138 mEq/L (ref 135–145)

## 2012-11-28 LAB — CBC
HCT: 42.5 % (ref 39.0–52.0)
Hemoglobin: 14.7 g/dL (ref 13.0–17.0)
MCH: 31.1 pg (ref 26.0–34.0)
MCHC: 34.6 g/dL (ref 30.0–36.0)
MCV: 89.9 fL (ref 78.0–100.0)
Platelets: 206 10*3/uL (ref 150–400)
RBC: 4.73 MIL/uL (ref 4.22–5.81)
RDW: 13.1 % (ref 11.5–15.5)
WBC: 5.9 10*3/uL (ref 4.0–10.5)

## 2012-11-28 LAB — TYPE AND SCREEN
ABO/RH(D): O POS
Antibody Screen: NEGATIVE

## 2012-11-28 NOTE — Pre-Procedure Instructions (Signed)
Matthew [name]  11/28/2012   Your procedure is scheduled on:  12/06/2012  Report to [facility]  2 * 3   ENTRANCE A    at 5:30 AM.  Call this number if you have problems the morning of surgery: [phone]   Remember:   Do not eat food or drink liquids after midnight.  On Thursday   Take these medicines the morning of surgery with A SIP OF WATER:  NONE   Do not wear jewelry  Do not wear lotions, powders, or perfumes. You may wear deodorant.   Men may shave face and neck.  Do not bring valuables to the hospital.  [facility] is not responsible                  for any belongings or valuables.               Contacts, dentures or bridgework may not be worn into surgery.  Leave suitcase in the car. After surgery it may be brought to your room.  For patients admitted to the hospital, discharge time is determined by your                treatment team.               Patients discharged the day of surgery will not be allowed to drive  home.  Name and phone number of your driver: with Wife  Special Instructions: Shower using CHG 2 nights before surgery and the night before surgery.  If you shower the day of surgery use CHG.  Use special wash - you have one bottle of CHG for all showers.  You should use approximately 1/3 of the bottle for each shower.   Please read over the following fact sheets that you were given: Pain Booklet, Coughing and Deep Breathing, Blood Transfusion Information and Surgical Site Infection Prevention

## 2012-11-29 NOTE — Progress Notes (Signed)
Anesthesia Chart Review:  Patient is a 72 year old male scheduled for left total shoulder arthroplasty versus reverse total shoulder arthroplasty on 12/06/12 by Dr. Ranell Patrick.    History includes nonsmoker, obesity, diabetes mellitus type 2, hypertension, arthritis, nephrolithiasis, CKD stage II, depression, dysrhythmia without mention of atrial fibrillation, L3-4 and L4-S1 PLIF 01/2012. PCP is Dr. Georgann Housekeeper [facility]) who medically cleared patient for this procedure.   His anesthesia history is documented by his PAT RN as, "Pt said that he went "flat line" during knee surgery in 2009 due to "Too much anesthesia". The following surgery he could hear therm talking and using a hammer--"too little anesthesia." The last surgery was just right". These were all done at [facility]. Prior to his PLIF in 01/2012, I reviewed anesthesia records received from 2008-2009. Notes indicate that he reported that in 1987 he received "so much meds that they 'lost him on the table'" during his left shoulder arthroscopy at a [facility] out[facility] center. He was kept overnight. He reportedly had no problems with a right hand surgery in 2005. In February 2009 he underwent right TKR with "SAB c Lumbar Plexus, propofol/ketamine infusion, no complications." Patient reported he could hear them talking and using a hammer. In 01/06/08 he underwent left TKR with regional and spinal without incident. Anesthesia records from 02/02/12 are in Epic for review as needed.  EKG on 11/13/12 [facility]) showed NSR,right BBB.  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 He was seen by Cardiologist Dr. Florian Buff at [facility] on 01/17/12 for HTN follow-up and clearance prior to his PLIF.   Nuclear stress test on 01/12/12 showed: Rest EKG showed normal sinus rhythm, right bundle branch block. Stress EKG was equivocal for ischemia with development of additional 1 mm ST depression in V2 through V5. He was hypertensive at rest and stress. He  was asymptomatic. Normal myocardial perfusion imaging study without evidence of ischemia or scar and normal left ventricular ejection fraction, 77%. This represents a low risk study.   Chest x-ray on 01/02/2012 showed no acute disease.   Preoperative labs noted.  He was medically cleared for this procedure and had a low risk stress test within the past 12 months.  If no acute changes then I would anticipate that he could proceed as planned.  Velna [facility] Short Stay Center/Anesthesiology Phone [phone] 11/29/2012 5:30 PM

## 2012-12-05 MED ORDER — CEFAZOLIN SODIUM-DEXTROSE 2-3 GM-% IV SOLR
2.0000 g | INTRAVENOUS | Status: AC
  Administered 2012-12-06: 2 g via INTRAVENOUS
  Filled 2012-12-05: qty 50

## 2012-12-06 ENCOUNTER — Encounter (HOSPITAL_COMMUNITY)
Admission: RE | Disposition: A | Discharge status: Home / Self Care | Payer: Self-pay | Source: Ambulatory Visit | Attending: Orthopedic Surgery

## 2012-12-06 ENCOUNTER — Encounter (HOSPITAL_COMMUNITY): Payer: Self-pay | Admitting: *Deleted

## 2012-12-06 ENCOUNTER — Inpatient Hospital Stay (HOSPITAL_COMMUNITY): Payer: Medicare Other

## 2012-12-06 ENCOUNTER — Inpatient Hospital Stay (HOSPITAL_COMMUNITY)
Admission: RE | Admit: 2012-12-06 | Discharge: 2012-12-07 | DRG: 483 | Disposition: A | Discharge status: Home / Self Care | Payer: Medicare Other | Source: Ambulatory Visit | Attending: Orthopedic Surgery | Admitting: Orthopedic Surgery

## 2012-12-06 ENCOUNTER — Encounter (HOSPITAL_COMMUNITY): Payer: Medicare Other | Admitting: Vascular Surgery

## 2012-12-06 ENCOUNTER — Inpatient Hospital Stay (HOSPITAL_COMMUNITY): Payer: Medicare Other | Admitting: Anesthesiology

## 2012-12-06 DIAGNOSIS — Z79899 Other long term (current) drug therapy: Secondary | ICD-10-CM

## 2012-12-06 DIAGNOSIS — M719 Bursopathy, unspecified: Secondary | ICD-10-CM | POA: Diagnosis present

## 2012-12-06 DIAGNOSIS — N189 Chronic kidney disease, unspecified: Secondary | ICD-10-CM | POA: Diagnosis present

## 2012-12-06 DIAGNOSIS — Z96612 Presence of left artificial shoulder joint: Secondary | ICD-10-CM

## 2012-12-06 DIAGNOSIS — M19019 Primary osteoarthritis, unspecified shoulder: Principal | ICD-10-CM | POA: Diagnosis present

## 2012-12-06 DIAGNOSIS — I129 Hypertensive chronic kidney disease with stage 1 through stage 4 chronic kidney disease, or unspecified chronic kidney disease: Secondary | ICD-10-CM | POA: Diagnosis present

## 2012-12-06 DIAGNOSIS — F329 Major depressive disorder, single episode, unspecified: Secondary | ICD-10-CM | POA: Diagnosis present

## 2012-12-06 DIAGNOSIS — M67919 Unspecified disorder of synovium and tendon, unspecified shoulder: Secondary | ICD-10-CM | POA: Diagnosis present

## 2012-12-06 DIAGNOSIS — Z981 Arthrodesis status: Secondary | ICD-10-CM

## 2012-12-06 DIAGNOSIS — Z96659 Presence of unspecified artificial knee joint: Secondary | ICD-10-CM

## 2012-12-06 DIAGNOSIS — Z7982 Long term (current) use of aspirin: Secondary | ICD-10-CM

## 2012-12-06 DIAGNOSIS — E119 Type 2 diabetes mellitus without complications: Secondary | ICD-10-CM | POA: Diagnosis present

## 2012-12-06 DIAGNOSIS — F3289 Other specified depressive episodes: Secondary | ICD-10-CM | POA: Diagnosis present

## 2012-12-06 HISTORY — PX: TOTAL SHOULDER ARTHROPLASTY: SHX126

## 2012-12-06 LAB — GLUCOSE, CAPILLARY
Glucose-Capillary: 137 mg/dL — ABNORMAL HIGH (ref 70–99)
Glucose-Capillary: 175 mg/dL — ABNORMAL HIGH (ref 70–99)
Glucose-Capillary: 335 mg/dL — ABNORMAL HIGH (ref 70–99)
Glucose-Capillary: 207 mg/dL — ABNORMAL HIGH (ref 70–99)

## 2012-12-06 SURGERY — ARTHROPLASTY, SHOULDER, TOTAL
Anesthesia: General | Site: Shoulder | Laterality: Left | Wound class: Clean

## 2012-12-06 MED ORDER — MENTHOL 3 MG MT LOZG: 1.0000 | LOZENGE | OROMUCOSAL | Status: DC | PRN

## 2012-12-06 MED ORDER — ATORVASTATIN CALCIUM 10 MG PO TABS
10.0000 mg | ORAL_TABLET | Freq: Every day | ORAL | Status: DC
  Administered 2012-12-06: 10 mg via ORAL
  Filled 2012-12-06 (×2): qty 1

## 2012-12-06 MED ORDER — PROPOFOL 10 MG/ML IV BOLUS
INTRAVENOUS | Status: DC | PRN
  Administered 2012-12-06: 110 mg via INTRAVENOUS

## 2012-12-06 MED ORDER — FENTANYL CITRATE 0.05 MG/ML IJ SOLN
INTRAMUSCULAR | Status: DC | PRN
  Administered 2012-12-06: 100 ug via INTRAVENOUS

## 2012-12-06 MED ORDER — BUPIVACAINE-EPINEPHRINE PF 0.25-1:200000 % IJ SOLN
INTRAMUSCULAR | Status: AC
  Filled 2012-12-06: qty 30

## 2012-12-06 MED ORDER — DIPHENHYDRAMINE HCL 25 MG PO TABS
25.0000 mg | ORAL_TABLET | Freq: Four times a day (QID) | ORAL | Status: DC | PRN
  Administered 2012-12-07 (×3): 25 mg via ORAL
  Filled 2012-12-06 (×4): qty 1

## 2012-12-06 MED ORDER — ACETAMINOPHEN 650 MG RE SUPP: 650.0000 mg | Freq: Four times a day (QID) | RECTAL | Status: DC | PRN

## 2012-12-06 MED ORDER — ROCURONIUM BROMIDE 100 MG/10ML IV SOLN
INTRAVENOUS | Status: DC | PRN
  Administered 2012-12-06: 50 mg via INTRAVENOUS

## 2012-12-06 MED ORDER — METHOCARBAMOL 100 MG/ML IJ SOLN
500.0000 mg | Freq: Four times a day (QID) | INTRAVENOUS | Status: DC | PRN
  Filled 2012-12-06: qty 5

## 2012-12-06 MED ORDER — NEOSTIGMINE METHYLSULFATE 1 MG/ML IJ SOLN
INTRAMUSCULAR | Status: DC | PRN
  Administered 2012-12-06: 3 mg via INTRAVENOUS

## 2012-12-06 MED ORDER — HYDROMORPHONE HCL PF 1 MG/ML IJ SOLN: 0.2500 mg | INTRAMUSCULAR | Status: DC | PRN

## 2012-12-06 MED ORDER — METFORMIN HCL 500 MG PO TABS
500.0000 mg | ORAL_TABLET | Freq: Two times a day (BID) | ORAL | Status: DC
  Administered 2012-12-06: 500 mg via ORAL
  Filled 2012-12-06 (×4): qty 1

## 2012-12-06 MED ORDER — GLYCOPYRROLATE 0.2 MG/ML IJ SOLN
INTRAMUSCULAR | Status: DC | PRN
  Administered 2012-12-06: .5 mg via INTRAVENOUS

## 2012-12-06 MED ORDER — GLIMEPIRIDE 2 MG PO TABS
2.0000 mg | ORAL_TABLET | Freq: Every day | ORAL | Status: DC
  Administered 2012-12-07: 2 mg via ORAL
  Filled 2012-12-06 (×3): qty 1

## 2012-12-06 MED ORDER — LACTATED RINGERS IV SOLN
INTRAVENOUS | Status: DC | PRN
  Administered 2012-12-06 (×2): via INTRAVENOUS

## 2012-12-06 MED ORDER — METOCLOPRAMIDE HCL 5 MG/ML IJ SOLN: 5.0000 mg | Freq: Three times a day (TID) | INTRAMUSCULAR | Status: DC | PRN

## 2012-12-06 MED ORDER — OXYCODONE-ACETAMINOPHEN 5-325 MG PO TABS
1.0000 | ORAL_TABLET | ORAL | Status: DC | PRN
  Administered 2012-12-06 – 2012-12-07 (×3): 2 via ORAL
  Filled 2012-12-06 (×3): qty 2

## 2012-12-06 MED ORDER — HYDROCHLOROTHIAZIDE 12.5 MG PO CAPS
12.5000 mg | ORAL_CAPSULE | Freq: Every day | ORAL | Status: DC
  Filled 2012-12-06: qty 1

## 2012-12-06 MED ORDER — ONDANSETRON HCL 4 MG PO TABS: 4.0000 mg | ORAL_TABLET | Freq: Four times a day (QID) | ORAL | Status: DC | PRN

## 2012-12-06 MED ORDER — ONDANSETRON HCL 4 MG/2ML IJ SOLN
INTRAMUSCULAR | Status: DC | PRN
  Administered 2012-12-06: 4 mg via INTRAVENOUS

## 2012-12-06 MED ORDER — METOCLOPRAMIDE HCL 10 MG PO TABS: 5.0000 mg | ORAL_TABLET | Freq: Three times a day (TID) | ORAL | Status: DC | PRN

## 2012-12-06 MED ORDER — MEPERIDINE HCL 25 MG/ML IJ SOLN: 6.2500 mg | INTRAMUSCULAR | Status: DC | PRN

## 2012-12-06 MED ORDER — BUPIVACAINE-EPINEPHRINE 0.25% -1:200000 IJ SOLN
INTRAMUSCULAR | Status: DC | PRN
  Administered 2012-12-06: 7 mL

## 2012-12-06 MED ORDER — ONDANSETRON HCL 4 MG/2ML IJ SOLN: 4.0000 mg | Freq: Four times a day (QID) | INTRAMUSCULAR | Status: DC | PRN

## 2012-12-06 MED ORDER — ASPIRIN EC 81 MG PO TBEC
81.0000 mg | DELAYED_RELEASE_TABLET | Freq: Every day | ORAL | Status: DC
  Administered 2012-12-06: 81 mg via ORAL
  Filled 2012-12-06 (×2): qty 1

## 2012-12-06 MED ORDER — OXYCODONE-ACETAMINOPHEN 5-325 MG PO TABS: 1.0000 | ORAL_TABLET | ORAL | Status: DC | PRN

## 2012-12-06 MED ORDER — OXYCODONE HCL 5 MG PO TABS: 5.0000 mg | ORAL_TABLET | Freq: Once | ORAL | Status: DC | PRN

## 2012-12-06 MED ORDER — METHOCARBAMOL 500 MG PO TABS: 500.0000 mg | ORAL_TABLET | Freq: Three times a day (TID) | ORAL | Status: DC | PRN

## 2012-12-06 MED ORDER — CEFAZOLIN SODIUM-DEXTROSE 2-3 GM-% IV SOLR
2.0000 g | Freq: Four times a day (QID) | INTRAVENOUS | Status: AC
  Administered 2012-12-06 – 2012-12-07 (×3): 2 g via INTRAVENOUS
  Filled 2012-12-06 (×3): qty 50

## 2012-12-06 MED ORDER — SODIUM CHLORIDE 0.9 % IV SOLN
10.0000 mg | INTRAVENOUS | Status: DC | PRN
  Administered 2012-12-06: 10 ug/min via INTRAVENOUS

## 2012-12-06 MED ORDER — PHENYLEPHRINE HCL 10 MG/ML IJ SOLN
INTRAMUSCULAR | Status: DC | PRN
  Administered 2012-12-06: 80 ug via INTRAVENOUS
  Administered 2012-12-06: 120 ug via INTRAVENOUS
  Administered 2012-12-06: 80 ug via INTRAVENOUS

## 2012-12-06 MED ORDER — PHENOL 1.4 % MT LIQD: 1.0000 | OROMUCOSAL | Status: DC | PRN

## 2012-12-06 MED ORDER — POTASSIUM CHLORIDE IN NACL 20-0.9 MEQ/L-% IV SOLN
INTRAVENOUS | Status: DC
  Administered 2012-12-06: 12:00:00 via INTRAVENOUS
  Filled 2012-12-06 (×2): qty 1000

## 2012-12-06 MED ORDER — HYDROMORPHONE HCL PF 1 MG/ML IJ SOLN
0.5000 mg | INTRAMUSCULAR | Status: DC | PRN
  Administered 2012-12-06: 1 mg via INTRAVENOUS
  Filled 2012-12-06: qty 1

## 2012-12-06 MED ORDER — METHOCARBAMOL 500 MG PO TABS: 500.0000 mg | ORAL_TABLET | Freq: Four times a day (QID) | ORAL | Status: DC | PRN

## 2012-12-06 MED ORDER — LOSARTAN POTASSIUM 50 MG PO TABS
100.0000 mg | ORAL_TABLET | Freq: Every day | ORAL | Status: DC
  Filled 2012-12-06: qty 2

## 2012-12-06 MED ORDER — MIDAZOLAM HCL 5 MG/5ML IJ SOLN
INTRAMUSCULAR | Status: DC | PRN
  Administered 2012-12-06: 2 mg via INTRAVENOUS

## 2012-12-06 MED ORDER — CHLORHEXIDINE GLUCONATE 4 % EX LIQD: 60.0000 mL | Freq: Once | CUTANEOUS | Status: DC

## 2012-12-06 MED ORDER — OXYCODONE HCL 5 MG/5ML PO SOLN: 5.0000 mg | Freq: Once | ORAL | Status: DC | PRN

## 2012-12-06 MED ORDER — LOSARTAN POTASSIUM-HCTZ 100-12.5 MG PO TABS: 1.0000 | ORAL_TABLET | Freq: Every day | ORAL | Status: DC

## 2012-12-06 MED ORDER — ONDANSETRON HCL 4 MG/2ML IJ SOLN: 4.0000 mg | Freq: Once | INTRAMUSCULAR | Status: DC | PRN

## 2012-12-06 MED ORDER — ACETAMINOPHEN 325 MG PO TABS: 650.0000 mg | ORAL_TABLET | Freq: Four times a day (QID) | ORAL | Status: DC | PRN

## 2012-12-06 MED ORDER — SODIUM CHLORIDE 0.9 % IR SOLN
Status: DC | PRN
  Administered 2012-12-06: 1000 mL

## 2012-12-06 MED ORDER — BUPIVACAINE-EPINEPHRINE PF 0.5-1:200000 % IJ SOLN
INTRAMUSCULAR | Status: DC | PRN
  Administered 2012-12-06: 30 mL

## 2012-12-06 SURGICAL SUPPLY — 81 items
BIT DRILL 170X2.5X (BIT) ×1 IMPLANT
BIT DRL 170X2.5X (BIT) ×1
BLADE SAW SAG 73X25 THK (BLADE) ×1
BLADE SAW SGTL 73X25 THK (BLADE) ×1 IMPLANT
BUR SURG 4X8 MED (BURR) IMPLANT
BURR SURG 4X8 MED (BURR)
CLOTH BEACON ORANGE TIMEOUT ST (SAFETY) ×2 IMPLANT
CLSR STERI-STRIP ANTIMIC 1/2X4 (GAUZE/BANDAGES/DRESSINGS) ×2 IMPLANT
COVER SURGICAL LIGHT HANDLE (MISCELLANEOUS) ×2 IMPLANT
CUP STAND PE 42 PLUS 9MM (Orthopedic Implant) ×2 IMPLANT
CUP STD PE 42 PLUS 9MM (Orthopedic Implant) ×1 IMPLANT
DRAPE INCISE IOBAN 66X45 STRL (DRAPES) ×6 IMPLANT
DRAPE U-SHAPE 47X51 STRL (DRAPES) ×2 IMPLANT
DRAPE X-RAY CASS 24X20 (DRAPES) IMPLANT
DRILL 2.5 (BIT) ×1
DRILL BIT 5/64 (BIT) ×2 IMPLANT
DRSG ADAPTIC 3X8 NADH LF (GAUZE/BANDAGES/DRESSINGS) ×2 IMPLANT
DRSG PAD ABDOMINAL 8X10 ST (GAUZE/BANDAGES/DRESSINGS) ×2 IMPLANT
DURAPREP 26ML APPLICATOR (WOUND CARE) ×2 IMPLANT
ELECT BLADE 4.0 EZ CLEAN MEGAD (MISCELLANEOUS) ×2
ELECT NEEDLE TIP 2.8 STRL (NEEDLE) ×2 IMPLANT
ELECT REM PT RETURN 9FT ADLT (ELECTROSURGICAL) ×2
ELECTRODE BLDE 4.0 EZ CLN MEGD (MISCELLANEOUS) ×1 IMPLANT
ELECTRODE REM PT RTRN 9FT ADLT (ELECTROSURGICAL) ×1 IMPLANT
EPIPHSYSI CENTER SZ 2 LT (Shoulder) ×2 IMPLANT
EPIPHYSIS CENTER SZ 2 LT (Shoulder) ×1 IMPLANT
GLENOSPHERE ECC D42MM 42 (Orthopedic Implant) ×2 IMPLANT
GLOVE BIOGEL PI ORTHO PRO 7.5 (GLOVE) ×1
GLOVE BIOGEL PI ORTHO PRO SZ8 (GLOVE) ×1
GLOVE ORTHO TXT STRL SZ7.5 (GLOVE) ×2 IMPLANT
GLOVE PI ORTHO PRO STRL 7.5 (GLOVE) ×1 IMPLANT
GLOVE PI ORTHO PRO STRL SZ8 (GLOVE) ×1 IMPLANT
GLOVE SURG ORTHO 8.5 STRL (GLOVE) ×4 IMPLANT
GOWN STRL REIN XL XLG (GOWN DISPOSABLE) ×6 IMPLANT
HANDPIECE INTERPULSE COAX TIP (DISPOSABLE)
KIT BASIN OR (CUSTOM PROCEDURE TRAY) ×2 IMPLANT
KIT ROOM TURNOVER OR (KITS) ×2 IMPLANT
MANIFOLD NEPTUNE II (INSTRUMENTS) ×2 IMPLANT
METAGLENE DELTA EXTEND (Trauma) ×1 IMPLANT
METAGLENE DXTEND (Trauma) ×2 IMPLANT
NDL SUT 6 .5 CRC .975X.05 MAYO (NEEDLE) ×1 IMPLANT
NEEDLE 1/2 CIR MAYO (NEEDLE) ×2 IMPLANT
NEEDLE HYPO 25GX1X1/2 BEV (NEEDLE) ×2 IMPLANT
NEEDLE MAYO TAPER (NEEDLE) ×1
NS IRRIG 1000ML POUR BTL (IV SOLUTION) ×2 IMPLANT
PACK SHOULDER (CUSTOM PROCEDURE TRAY) ×2 IMPLANT
PAD ARMBOARD 7.5X6 YLW CONV (MISCELLANEOUS) ×4 IMPLANT
PIN METAGLENE 2.5 (PIN) ×2 IMPLANT
SCREW 4.5X18MM (Screw) ×1 IMPLANT
SCREW 4.5X24MM (Screw) ×1 IMPLANT
SCREW 4.5X36MM (Screw) ×2 IMPLANT
SCREW BN 18X4.5XSTRL SHLDR (Screw) ×1 IMPLANT
SCREW BN 24X4.5XLCK STRL (Screw) ×1 IMPLANT
SCREW LOCK DELTA XTEND 4.5X30 (Screw) ×2 IMPLANT
SET HNDPC FAN SPRY TIP SCT (DISPOSABLE) IMPLANT
SLING ARM IMMOBILIZER LRG (SOFTGOODS) ×2 IMPLANT
SLING ARM IMMOBILIZER MED (SOFTGOODS) IMPLANT
SMARTMIX MINI TOWER (MISCELLANEOUS) ×2
SPONGE GAUZE 4X4 12PLY (GAUZE/BANDAGES/DRESSINGS) ×2 IMPLANT
SPONGE LAP 18X18 X RAY DECT (DISPOSABLE) ×2 IMPLANT
SPONGE LAP 4X18 X RAY DECT (DISPOSABLE) ×2 IMPLANT
SPONGE SURGIFOAM ABS GEL SZ50 (HEMOSTASIS) IMPLANT
STEM DELTA DIA 10 HA (Stem) ×2 IMPLANT
STRIP CLOSURE SKIN 1/2X4 (GAUZE/BANDAGES/DRESSINGS) ×2 IMPLANT
SUCTION FRAZIER TIP 10 FR DISP (SUCTIONS) ×2 IMPLANT
SUT FIBERWIRE #2 38 T-5 BLUE (SUTURE) ×4
SUT MNCRL AB 4-0 PS2 18 (SUTURE) ×2 IMPLANT
SUT VIC AB 0 CT1 27 (SUTURE) ×1
SUT VIC AB 0 CT1 27XBRD ANBCTR (SUTURE) ×1 IMPLANT
SUT VIC AB 2-0 CT1 27 (SUTURE) ×1
SUT VIC AB 2-0 CT1 TAPERPNT 27 (SUTURE) ×1 IMPLANT
SUT VICRYL AB 2 0 TIES (SUTURE) ×2 IMPLANT
SUTURE FIBERWR #2 38 T-5 BLUE (SUTURE) ×2 IMPLANT
SYR CONTROL 10ML LL (SYRINGE) ×2 IMPLANT
TAPE CLOTH SURG 6X10 WHT LF (GAUZE/BANDAGES/DRESSINGS) ×2 IMPLANT
TOWEL OR 17X24 6PK STRL BLUE (TOWEL DISPOSABLE) ×2 IMPLANT
TOWEL OR 17X26 10 PK STRL BLUE (TOWEL DISPOSABLE) ×2 IMPLANT
TOWER SMARTMIX MINI (MISCELLANEOUS) ×1 IMPLANT
TRAY FOLEY CATH 16FRSI W/METER (SET/KITS/TRAYS/PACK) ×2 IMPLANT
WATER STERILE IRR 1000ML POUR (IV SOLUTION) ×2 IMPLANT
YANKAUER SUCT BULB TIP NO VENT (SUCTIONS) IMPLANT

## 2012-12-06 NOTE — Anesthesia Procedure Notes (Addendum)
Anesthesia Regional Block:  Interscalene brachial plexus block  Pre-Anesthetic Checklist: ,, timeout performed, Correct Patient, Correct Site, Correct Laterality, Correct Procedure, Correct Position, site marked, Risks and benefits discussed,  Surgical consent,  Pre-op evaluation,  At surgeon's request and post-op pain management  Laterality: Left  Prep: chloraprep       Needles:  Injection technique: Single-shot  Needle Type: Echogenic Stimulator Needle     Needle Length: 9cm  Needle Gauge: 21 and 21 G    Additional Needles:  Procedures: ultrasound guided (picture in chart) and nerve stimulator Interscalene brachial plexus block  Nerve Stimulator or Paresthesia:  Response: 0.4 mA,   Additional Responses:   Narrative:  Start time: 12/06/2012 7:00 AM End time: 12/06/2012 7:12 AM Injection made incrementally with aspirations every 5 mL.  Performed by: Personally  Anesthesiologist: Arta Bruce MD  Additional Notes: Monitors applied. Patient sedated. Sterile prep and drape,hand hygiene and sterile gloves were used. Relevant anatomy identified.Needle position confirmed.Local anesthetic injected incrementally after negative aspiration. Local anesthetic spread visualized around nerve(s). Vascular puncture avoided. No complications. Image printed for medical record.The patient tolerated the procedure well.       Interscalene brachial plexus block Procedure Name: Intubation Date/Time: 12/06/2012 7:41 AM Performed by: Sharlene Dory E Pre-anesthesia Checklist: Patient identified, Emergency Drugs available, Suction available, Patient being monitored and Timeout performed Patient Re-evaluated:Patient Re-evaluated prior to inductionOxygen Delivery Method: Circle system utilized Preoxygenation: Pre-oxygenation with 100% oxygen Intubation Type: IV induction Ventilation: Mask ventilation without difficulty and Oral airway inserted - appropriate to patient size Laryngoscope Size: Mac  and 4 Grade View: Grade II Tube size: 7.5 mm Number of attempts: 1 Airway Equipment and Method: Stylet Placement Confirmation: ETT inserted through vocal cords under direct vision,  positive ETCO2 and breath sounds checked- equal and bilateral Secured at: 22 cm Tube secured with: Tape Dental Injury: Teeth and Oropharynx as per pre-operative assessment

## 2012-12-06 NOTE — Interval H&P Note (Signed)
History and Physical Interval Note:  12/06/2012 7:28 AM  Matthew [name]  has presented today for surgery, with the diagnosis of LEFT SHOULDER OA  The various methods of treatment have been discussed with the patient and family. After consideration of risks, benefits and other options for treatment, the patient has consented to  Procedure(s): LEFT TOTAL SHOULDER ARTHROPLASTY VERSES A REVERSE TOTAL SHOULDER ARTHROPLASTY (Left) as a surgical intervention .  The patient's history has been reviewed, patient examined, no change in status, stable for surgery.  I have reviewed the patient's chart and labs.  Questions were answered to the patient's satisfaction.     Daran Favaro,STEVEN R

## 2012-12-06 NOTE — Anesthesia Postprocedure Evaluation (Signed)
Anesthesia Post Note  Patient: Matthew [name]  Procedure(s) Performed: Procedure(s) (LRB): LEFT TOTAL SHOULDER ARTHROPLASTY VERSES A REVERSE TOTAL SHOULDER ARTHROPLASTY (Left)  Anesthesia type: general  Patient location: PACU  Post pain: Pain level controlled  Post assessment: Patient's Cardiovascular Status Stable  Last Vitals:  Filed Vitals:   12/06/12 1203  BP: 140/91  Pulse: 77  Temp: 36.4 C  Resp: 18    Post vital signs: Reviewed and stable  Level of consciousness: sedated  Complications: No apparent anesthesia complications

## 2012-12-06 NOTE — Anesthesia Preprocedure Evaluation (Addendum)
Anesthesia Evaluation  Patient identified by MRN, date of birth, ID band Patient awake    Reviewed: Allergy & Precautions, H&P , NPO status , Patient's Chart, lab work & pertinent test results  Airway Mallampati: I TM Distance: >3 FB Neck ROM: Full    Dental  (+) Teeth Intact   Pulmonary          Cardiovascular hypertension, Pt. on medications     Neuro/Psych    GI/Hepatic   Endo/Other  diabetes, Type 2, Oral Hypoglycemic Agents  Renal/GU Renal InsufficiencyRenal disease     Musculoskeletal   Abdominal   Peds  Hematology   Anesthesia Other Findings   Reproductive/Obstetrics                          Anesthesia Physical Anesthesia Plan  ASA: II  Anesthesia Plan: General   Post-op Pain Management:    Induction: Intravenous  Airway Management Planned: Oral ETT  Additional Equipment:   Intra-op Plan:   Post-operative Plan: Extubation in OR  Informed Consent: I have reviewed the patients History and Physical, chart, labs and discussed the procedure including the risks, benefits and alternatives for the proposed anesthesia with the patient or authorized representative who has indicated his/her understanding and acceptance.     Plan Discussed with: CRNA and Surgeon  Anesthesia Plan Comments:         Anesthesia Quick Evaluation

## 2012-12-06 NOTE — Preoperative (Signed)
Beta Blockers   Reason not to administer Beta Blockers:Not Applicable 

## 2012-12-06 NOTE — Transfer of Care (Signed)
Immediate Anesthesia Transfer of Care Note  Patient: Matthew [name]  Procedure(s) Performed: Procedure(s): LEFT TOTAL SHOULDER ARTHROPLASTY VERSES A REVERSE TOTAL SHOULDER ARTHROPLASTY (Left)  Patient Location: PACU  Anesthesia Type:General  Level of Consciousness: awake, alert  and oriented  Airway & Oxygen Therapy: Patient Spontanous Breathing and Patient connected to nasal cannula oxygen  Post-op Assessment: Report given to PACU RN, Post -op Vital signs reviewed and stable and Patient moving all extremities  Post vital signs: Reviewed and stable  Complications: No apparent anesthesia complications

## 2012-12-06 NOTE — Progress Notes (Signed)
UR COMPLETED  

## 2012-12-06 NOTE — Progress Notes (Signed)
Orthopedics Progress Note  Subjective: I feel fine  Objective:  Filed Vitals:   12/06/12 1203  BP: 140/91  Pulse: 77  Temp: 97.6 F (36.4 C)  Resp: 18    General: Awake and alert  Musculoskeletal: left shoulder dressing CDI,  Patient sitting up in chair at bed side.  Arm in good position in sling.  Block still working.   Lab Results  Component Value Date   WBC 5.9 11/28/2012   HGB 14.7 11/28/2012   HCT 42.5 11/28/2012   MCV 89.9 11/28/2012   PLT 206 11/28/2012       Component Value Date/Time   NA 138 11/28/2012 1510   K 4.3 11/28/2012 1510   CL 98 11/28/2012 1510   CO2 28 11/28/2012 1510   GLUCOSE 196* 11/28/2012 1510   BUN 17 11/28/2012 1510   CREATININE 1.22 11/28/2012 1510   CALCIUM 9.2 11/28/2012 1510   GFRNONAA 58* 11/28/2012 1510   GFRAA 68* 11/28/2012 1510    No results found for this basename: INR, PROTIME    Assessment/Plan:  s/p Procedure(s): LEFT REVERSE TOTAL SHOULDER ARTHROPLASTY Stable post op.  If patient comfortable and getting around ok, may D/C to home tomorrow. Rx on chart. Follow up in two weeks  Almedia Balls. Ranell Patrick, MD 12/06/2012 4:05 PM

## 2012-12-06 NOTE — Op Note (Signed)
NAMEAKIF, WELDY NO.:  [id number]  MEDICAL RECORD NO.:  [id number]  LOCATION:  5N06C                        FACILITY:  [facility]  PHYSICIAN:  Almedia Balls. Ranell Patrick, M.D. 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  PROCEDURE PERFORMED:  Left shoulder reverse total shoulder arthroplasty using DePuy Delta Xtend prosthesis.  ATTENDING SURGEON:  Almedia Balls. Ranell Patrick, M.D.  ASSISTANT:  Donnie Coffin. Dixon, PA-C, who was scrubbed in the entire procedure and necessary for satisfactory completion of surgery.  ANESTHESIA:  General anesthesia was used plus interscalene block.  ESTIMATED BLOOD LOSS:  50 mL.  FLUID REPLACED:  1500 mL of crystalloid.  INSTRUMENT COUNTS:  Correct.  COMPLICATIONS:  There were no complications.  ANTIBIOTICS:  Perioperative antibiotics were given.  INDICATIONS:  The patient is a 72 year old male who suffers from debilitating left shoulder pain and poor function secondary to rotator cuff insufficiency and shoulder arthritis.  The patient has bone-on-bone on x-ray and MRI scan and has advanced atrophy of the infraspinatus and teres minor muscles and some atrophy of the supraspinatus and subscapularis muscles.  Based on poor mechanics and advanced atrophy on MRI, I recommended reverse total shoulder arthroplasty for this 72 year old individual.  He agreed to this plan.  Risks and benefits were discussed.  Informed consent was obtained.  DESCRIPTION OF OPERATION:  After an adequate level of anesthesia achieved, the patient was positioned in a modified beach-chair position. Left shoulder correctly identified, sterilely prepped, and draped in the usual manner.  Time-out was called.   We entered the shoulder in standard deltopectoral incision.  We started the coracoid process extending down to the anterior humerus.  Dissection down through subcutaneous tissues. We identified the cephalic vein and took it laterally with the deltoid. The pectoralis taken medially.  Conjoined tendon identified and retracted.  The subscapularis was identified on the lesser tuberosity and that was removed subperiosteally from the lesser tuberosity.  We placed #2 FiberWire suture in a modified Mason-Allen suture technique into the free end of the tendon for repair.  We developed the rotator interval, then released the anterior portion of the rotator cuff off the greater tuberosity.  This cuff was intact, but again the muscle showed some pretty significant atrophy, especially posteriorly.  We just removed and after the rotator cuff to be able to access the shoulder released the inferior capsule off the humeral neck as we externally rotated the humerus.  We were then able to deliver the humerus completely out of the wound and drill through the superior humerus with our reamer size 6 reamer into the canal.  We reamed up to a size 10 and had nice tight fit with that.  We then placed our intramedullary guide for the metaphyseal preparation.  Once that was impacted in place, we reamed for the epi to left metaphyseal portion for the DePuy Delta Xtend prosthesis.  We then impacted the trial prosthesis in 10 degrees of retroversion.  We are  pleased with that positioning.  We removed all excess bone spurs off the humerus.  We retracted the humerus posteriorly and then did a 360-degree capsular removal protecting the axillary nerve.  We also released the subscapularis so it was freed of balance. We then prepared the glenoid, found the center point for the glenoid and drilled our center guide pin, and then we reamed for the metaglene.  We did our peripheral reaming, removing excess bone spurs around  the periphery and did our hand reamer for the periphery of the glenoid preparation.  We then drilled out our central lug hole for the metaglene and then impacted the metaglene in position referencing off the inferior scapular body and neck which I could palpate with a Therapist, nutritional.  We were careful to protect the axillary nerve.  We thoroughly irrigated. Once that metaglene was in place, we placed 3 locked screws and 1 nonlocked screw.  We did a 36 inferiorly, a 30 approximately, a 24 anteriorly and then a 18 nonlocked posteriorly.  We locked our locking screws.  We then went ahead and placed our Nitinol guide wire.  I then placed a 42 standard glenoid sphere into position, screwed that into place.  We are pleased with that, checked our nerve to make sure it was free and clear which it was.  We then reduced the shoulder with a 42 +6 poly, felt like we can definitively get a+9 as long as we are able to seat our real HA coated stem to the same depth.  We then placed the drill holes and #2 FiberWire suture into the metaphyseal portion of the humerus for repair of the rotator cuff and the subscapularis.  We then went ahead and using impaction grafting technique, impacted the size 10 HA coated stem with the size 2 left HA metaphyseal modular component and that was set on 0 with again the whole device going into 10 degrees of retroversion.  Once that was impacted into place, we had nice secure fit.  We used those sutures to repair.  We went ahead and reduced with a plate or we put a +9 polyethylene 42+ 9 impacted that in place, thoroughly irrigated, then reduced the shoulder.  We are pleased with soft tissue balancing, no gapping with external rotation.  Negative sulcus and nice tight conjoined tendon.  The axillary nerve was palpated and was not under too much tension.  We then repaired the rotator cuff with the sutures through bone and then subscapularis back anatomically with the less  tuberosity also with sutures through bone and then the rotator interval repaired as well.  This did not limit motion in any way.  I was able to passively move that shoulder all over and had nice stability in good range.  We then irrigated again and closed deltopectoral interval with 0 Vicryl suture, followed by 2-0 Vicryl subcutaneous closure and 4-0 Monocryl for skin.  Steri-Strips were applied, followed by a sterile dressing.  The patient tolerated the surgery well.     Almedia Balls. Ranell Patrick, M.D.     SRN/MEDQ  D:  12/06/2012  T:  12/06/2012  Job:  [id number]

## 2012-12-06 NOTE — Brief Op Note (Signed)
12/06/2012  10:21 AM  PATIENT:  Matthew [name]  72 y.o. male  PRE-OPERATIVE DIAGNOSIS:  LEFT SHOULDER OA, ROTATOR CUFF INSUFFICIENCY  POST-OPERATIVE DIAGNOSIS:  LEFT SHOULDER OA, ROTATOR CUFF INSUFFICIENCY  PROCEDURE:  LEFT SHOULDER REVERSE TOTAL SHOULDER ARTHROPLASTY  SURGEON:  Surgeon(s) and Role:    * Verlee Rossetti, MD - Primary  PHYSICIAN ASSISTANT:   ASSISTANTS: Thea Gist, PA-C   ANESTHESIA:   regional and general  EBL:  Total I/O In: 1000 [I.V.:1000] Out: 50 [Blood:50]  BLOOD ADMINISTERED:none  DRAINS: none   LOCAL MEDICATIONS USED:  MARCAINE     SPECIMEN:  No Specimen  DISPOSITION OF SPECIMEN:  N/A  COUNTS:  YES  TOURNIQUET:  * No tourniquets in log *  DICTATION: .Other Dictation: Dictation Number [id number]  PLAN OF CARE: Admit to inpatient   PATIENT DISPOSITION:  PACU - hemodynamically stable.   Delay start of Pharmacological VTE agent (>24hrs) due to surgical blood loss or risk of bleeding: not applicable

## 2012-12-07 LAB — BASIC METABOLIC PANEL
BUN: 20 mg/dL (ref 6–23)
CO2: 25 mEq/L (ref 19–32)
Calcium: 8.5 mg/dL (ref 8.4–10.5)
Chloride: 102 mEq/L (ref 96–112)
Creatinine, Ser: 1.17 mg/dL (ref 0.50–1.35)
GFR calc Af Amer: 71 mL/min — ABNORMAL LOW (ref >90)
GFR calc non Af Amer: 61 mL/min — ABNORMAL LOW (ref >90)
Glucose, Bld: 153 mg/dL — ABNORMAL HIGH (ref 70–99)
Potassium: 3.9 mEq/L (ref 3.5–5.1)
Sodium: 138 mEq/L (ref 135–145)

## 2012-12-07 LAB — HEMOGLOBIN AND HEMATOCRIT, BLOOD
HCT: 35.6 % — ABNORMAL LOW (ref 39.0–52.0)
Hemoglobin: 12.2 g/dL — ABNORMAL LOW (ref 13.0–17.0)

## 2012-12-07 LAB — GLUCOSE, CAPILLARY: Glucose-Capillary: 150 mg/dL — ABNORMAL HIGH (ref 70–99)

## 2012-12-07 NOTE — Discharge Summary (Signed)
Physician Discharge Summary   Patient ID: Matthew [name] MRN: [id number] DOB/AGE: 03/30/1940 72 y.o.  Admit date: 12/06/2012 Discharge date: 12/07/2012  Admission Diagnoses:  Left shoulder OA, end stage, with rotator cuff insufficiency  Discharge Diagnoses:  Same   Surgeries: Procedure(s): LEFT TOTAL SHOULDER ARTHROPLASTY VERSES A REVERSE TOTAL SHOULDER ARTHROPLASTY on 12/06/2012   Consultants: Occupational Therapy  Discharged Condition: Stable  Hospital Course: Matthew [name] is an 72 y.o. male who was admitted 12/06/2012 with a chief complaint of left shoulder pain, and found to have a diagnosis of left shoulder end stage arthritis.  They were brought to the operating room on 12/06/2012 and underwent the above named procedures.    The patient had an uncomplicated hospital course and was stable for discharge.  Recent vital signs:  Filed Vitals:   12/07/12 0500  BP: 138/82  Pulse: 81  Temp: 99.1 F (37.3 C)  Resp: 16    Recent laboratory studies:  Results for orders placed during the hospital encounter of 12/06/12  GLUCOSE, CAPILLARY      Result Value Range   Glucose-Capillary 137 (*) 70 - 99 mg/dL  GLUCOSE, CAPILLARY      Result Value Range   Glucose-Capillary 175 (*) 70 - 99 mg/dL   Comment 1 Documented in Chart     Comment 2 Notify RN    HEMOGLOBIN AND HEMATOCRIT, BLOOD      Result Value Range   Hemoglobin 12.2 (*) 13.0 - 17.0 g/dL   HCT 40.9 (*) 81.1 - 91.4 %  BASIC METABOLIC PANEL      Result Value Range   Sodium 138  135 - 145 mEq/L   Potassium 3.9  3.5 - 5.1 mEq/L   Chloride 102  96 - 112 mEq/L   CO2 25  19 - 32 mEq/L   Glucose, Bld 153 (*) 70 - 99 mg/dL   BUN 20  6 - 23 mg/dL   Creatinine, Ser 7.82  0.50 - 1.35 mg/dL   Calcium 8.5  8.4 - 95.6 mg/dL   GFR calc non Af Amer 61 (*) >90 mL/min   GFR calc Af Amer 71 (*) >90 mL/min  GLUCOSE, CAPILLARY      Result Value Range   Glucose-Capillary 207 (*) 70 - 99 mg/dL  GLUCOSE, CAPILLARY      Result  Value Range   Glucose-Capillary 335 (*) 70 - 99 mg/dL   Comment 1 Documented in Chart     Comment 2 Notify RN    GLUCOSE, CAPILLARY      Result Value Range   Glucose-Capillary 150 (*) 70 - 99 mg/dL    Discharge Medications:     Medication List    STOP taking these medications       naproxen sodium 220 MG tablet  Commonly known as:  ANAPROX      TAKE these medications       aspirin EC 81 MG tablet  Take 81 mg by mouth daily.     diphenhydrAMINE 25 MG tablet  Commonly known as:  BENADRYL  Take 25 mg by mouth every 6 (six) hours as needed for itching.     glimepiride 2 MG tablet  Commonly known as:  AMARYL  Take 2 mg by mouth daily before breakfast.     losartan-hydrochlorothiazide 100-12.5 MG per tablet  Commonly known as:  HYZAAR  Take 1 tablet by mouth daily before breakfast.     metFORMIN 500 MG tablet  Commonly known as:  GLUCOPHAGE  Take 500 mg  by mouth 2 (two) times daily with a meal.     methocarbamol 500 MG tablet  Commonly known as:  ROBAXIN  Take 1 tablet (500 mg total) by mouth 3 (three) times daily as needed.     oxyCODONE-acetaminophen 5-325 MG per tablet  Commonly known as:  ROXICET  Take 1-2 tablets by mouth every 4 (four) hours as needed for pain.     rosuvastatin 5 MG tablet  Commonly known as:  CRESTOR  Take 5 mg by mouth daily before breakfast.        Diagnostic Studies: Dg Shoulder Left  12/06/2012   CLINICAL DATA:  Status post left reverse total shoulder arthroplasty  EXAM: LEFT SHOULDER - 1 VIEW  COMPARISON:  None.  FINDINGS: Status post left shoulder arthroplasty in satisfactory position. Associated subcutaneous gas.  No evidence of hardware complication.  No fracture or dislocation is seen.  Visualized left lung is clear.  IMPRESSION: Status post left shoulder arthroplasty in satisfactory position.   Electronically Signed   By: Charline Bills M.D.   On: 12/06/2012 11:19    Disposition: 01-Home or Self Care        Follow-up  Information   Follow up with Loran Fleet,STEVEN R, MD. Call in 2 weeks. [phone])    Specialty:  Orthopedic Surgery   Contact information:   [address] [place] [address] [phone]        Signed: Verlee Rossetti 12/07/2012, 8:07 AM

## 2012-12-07 NOTE — Progress Notes (Signed)
Agree with above 

## 2012-12-07 NOTE — Progress Notes (Signed)
Subjective: 1 Day Post-Op Procedure(s) (LRB): LEFT TOTAL SHOULDER ARTHROPLASTY VERSES A REVERSE TOTAL SHOULDER ARTHROPLASTY (Left) Patient reports pain as controlled.  Tolerating PO's well. Some Itching with medications , but rash or SOB/CP. Patient and wife state they are ready to D/c home. NO N/V/C.  Objective: Vital signs in last 24 hours: Temp:  [97.1 F (36.2 C)-99.3 F (37.4 C)] 99.1 F (37.3 C) (11/01 0500) Pulse Rate:  [73-90] 81 (11/01 0500) Resp:  [12-18] 16 (11/01 0500) BP: (98-144)/(64-91) 138/82 mmHg (11/01 0500) SpO2:  [93 %-100 %] 93 % (11/01 0500) Weight:  [102.513 kg (226 lb)] 102.513 kg (226 lb) (10/31 1203)  Intake/Output from previous day: 10/31 0701 - 11/01 0700 In: 1960 [P.O.:360; I.V.:1600] Out: 300 [Urine:250; Blood:50] Intake/Output this shift:     Recent Labs  12/07/12 0504  HGB 12.2*    Recent Labs  12/07/12 0504  HCT 35.6*    Recent Labs  12/07/12 0504  NA 138  K 3.9  CL 102  CO2 25  BUN 20  CREATININE 1.17  GLUCOSE 153*  CALCIUM 8.5   No results found for this basename: LABPT, INR,  in the last 72 hours  Alert and oriented. RRR, ABD: S/NT,Lungs clear. Dressing changed. Incisions well approximated, no spreading redness or drainage. LUE neurovascularly intact with +2 radial pulse, ulnar/radial/medial nerve intact. Wearing Sling and up in chair.  Assessment/Plan: 1 Day Post-Op Procedure(s) (LRB): LEFT TOTAL SHOULDER ARTHROPLASTY VERSES A REVERSE TOTAL SHOULDER ARTHROPLASTY (Left) D/C home today with family. RX done D/C instructions done Follow up in office in 2 weeks Question encouraged and answered. Take benadryl as directed for itching.  Matthew [name] L 12/07/2012, 9:49 AM

## 2012-12-07 NOTE — Evaluation (Signed)
Occupational Therapy Evaluation Patient Details Name: Matthew [name] MRN: [id number] DOB: 06-20-1940 Today's Date: 12/07/2012 Time: 4098-1191 OT Time Calculation (min): 20 min  OT Assessment / Plan / Recommendation History of present illness LEFT REVERSE TOTAL SHOULDER ARTHROPLASTY   Clinical Impression   Pt s/p left reverse TSA. Educated both pt and wife on shoulder protocol and HEP. Pt verbalized understanding of HEP but did not perform exercises during session. Pt preparing for d/c during OT session.    OT Assessment  Progress rehab of shoulder as ordered by MD at follow-up appointment    Follow Up Recommendations  Supervision/Assistance - 24 hour    Barriers to Discharge      Equipment Recommendations  None recommended by OT    Recommendations for Other Services    Frequency       Precautions / Restrictions Precautions Precautions: Shoulder Type of Shoulder Precautions: Dr Ranell Patrick Protocol Shoulder Interventions: Shoulder sling/immobilizer Precaution Booklet Issued: Yes (comment) Required Braces or Orthoses: Sling   Pertinent Vitals/Pain See vitals    ADL  Upper Body Dressing: Performed;Minimal assistance Where Assessed - Upper Body Dressing: Unsupported sitting Lower Body Dressing: Performed;Minimal assistance Where Assessed - Lower Body Dressing: Unsupported sit to stand Equipment Used: Other (comment) (sling) Transfers/Ambulation Related to ADLs: supervision sit<>stand from chair ADL Comments: Pt up in chair preparing for d/c home.  Wife present and independent in assisting pt with dressing ADLs. Educated pt on HEP and Engineer, mining. Pt declining participation in shoulder HEP at this time but verbalized understanding of exercises (pt declining due to trying to discharge ASAP).     OT Diagnosis:    OT Problem List:   OT Treatment Interventions:     OT Goals(Current goals can be found in the care plan section)    Visit Information  Last OT Received On:  12/07/12 Assistance Needed: +1 History of Present Illness: LEFT REVERSE TOTAL SHOULDER ARTHROPLASTY       Prior Functioning     Home Living Family/patient expects to be discharged to:: Private residence Living Arrangements: Spouse/significant other Available Help at Discharge: Family;Available 24 hours/day Type of Home: Mobile home Home Access: Stairs to enter Entrance Stairs-Number of Steps: 1 Entrance Stairs-Rails: None Home Layout: One level Prior Function Level of Independence: Independent         Vision/Perception     Cognition  Cognition Arousal/Alertness: Awake/alert Behavior During Therapy: WFL for tasks assessed/performed Overall Cognitive Status: Within Functional Limits for tasks assessed    Extremity/Trunk Assessment Upper Extremity Assessment Upper Extremity Assessment: RUE deficits/detail RUE: Unable to fully assess due to pain     Mobility Transfers Transfers: Sit to Stand;Stand to Sit Sit to Stand: 5: Supervision;From chair/3-in-1 Stand to Sit: 5: Supervision;To chair/3-in-1     Exercise     Balance     End of Session OT - End of Session Equipment Utilized During Treatment:  (sling) Activity Tolerance: Patient tolerated treatment well Patient left: in chair;with call bell/phone within reach;with family/visitor present Nurse Communication: Mobility status  GO   12/07/2012 Cipriano Mile OTR/L Pager [phone] Office [phone]   Cipriano Mile 12/07/2012, 4:45 PM

## 2012-12-12 ENCOUNTER — Encounter (HOSPITAL_COMMUNITY): Payer: Self-pay | Admitting: Orthopedic Surgery

## 2015-03-04 ENCOUNTER — Encounter: Payer: Medicare Other | Attending: Internal Medicine | Admitting: *Deleted

## 2015-03-04 ENCOUNTER — Encounter: Payer: Self-pay | Admitting: *Deleted

## 2015-03-04 VITALS — Ht 69.0 in | Wt 212.4 lb

## 2015-03-04 DIAGNOSIS — E118 Type 2 diabetes mellitus with unspecified complications: Secondary | ICD-10-CM

## 2015-03-04 DIAGNOSIS — E119 Type 2 diabetes mellitus without complications: Secondary | ICD-10-CM | POA: Insufficient documentation

## 2015-03-04 DIAGNOSIS — E1165 Type 2 diabetes mellitus with hyperglycemia: Secondary | ICD-10-CM

## 2015-03-04 NOTE — Patient Instructions (Signed)
Plan:  Aim for 3 Carb Choices per meal (45 grams) +/- 1 either way  Aim for 0-2 Carbs per snack if hungry  Include protein in moderation with your meals and snacks Consider reading food labels for Total Carbohydrate of foods Consider  increasing your activity level by swimming or Arm Chair Exercises daily as tolerated Continue checking BG at alternate times per day as directed by MD  Continue taking medication as directed by MD

## 2015-03-10 NOTE — Progress Notes (Signed)
Diabetes Self-Management Education  Visit Type: First/Initial  Appt. Start Time: 1530 Appt. End Time: 1700  03/10/2015  [name], identified by name and date of birth, is a 75 y.o. male with a diagnosis of Diabetes: Type 2.   ASSESSMENT  Height 5\' 9"  (1.753 m), weight 212 lb 6.4 oz (96.344 kg). Body mass index is 31.35 kg/(m^2).      Diabetes Self-Management Education - 03/04/15 1538    Visit Information   Visit Type First/Initial   Initial Visit   Diabetes Type Type 2   Are you currently following a meal plan? No   Are you taking your medications as prescribed? Yes   Date Diagnosed 1990   Health Coping   How would you rate your overall health? Good   Psychosocial Assessment   Patient Belief/Attitude about Diabetes Motivated to manage diabetes   Self-care barriers None   Self-management support Family   Other persons present Patient   Patient Concerns Nutrition/Meal planning;Glycemic Control;Weight Control   Preferred Learning Style Visual;Hands on   Learning Readiness Ready   What is the last grade level you completed in school? 4 years [employer], [employer] and [employer]   Complications   Last HgB A1C per patient/outside source 7.2 %   How often do you check your blood sugar? 1-2 times/day   Fasting Blood glucose range (mg/dL) 130-179;180-200   Number of hypoglycemic episodes per month 1   Can you tell when your blood sugar is low? Yes   What do you do if your blood sugar is low? drinks regular coke   Have you had a dilated eye exam in the past 12 months? Yes   Have you had a dental exam in the past 12 months? Yes   Are you checking your feet? Yes   How many days per week are you checking your feet? 7   Dietary Intake   Breakfast sweetened cereal OR 2 toast with jelly on 1 and PNB on other OR oatmeal OR bacon, eggs and toast OR pancakes   Snack (morning) no   Lunch skips usually   Dinner 7:30 - 9 PM: occasionally meat, stir fry with rice  OR spaghetti OR vegetables with sweet potato,    Snack (evening) has stopped snacking    Beverage(s) OJ   Exercise   Exercise Type Light (walking / raking leaves)  walks as able with bad knee and post back surgery   How many days per week to you exercise? 2   How many minutes per day do you exercise? 30   Total minutes per week of exercise 60   Patient Education   Previous Diabetes Education No   Disease state  Definition of diabetes, type 1 and 2, and the diagnosis of diabetes   Nutrition management  Role of diet in the treatment of diabetes and the relationship between the three main macronutrients and blood glucose level;Food label reading, portion sizes and measuring food.;Carbohydrate counting   Physical activity and exercise  Role of exercise on diabetes management, blood pressure control and cardiac health.   Medications Reviewed patients medication for diabetes, action, purpose, timing of dose and side effects.  Reviewed insulin action   Monitoring Purpose and frequency of SMBG.;Identified appropriate SMBG and/or A1C goals.   Chronic complications Relationship between chronic complications and blood glucose control   Psychosocial adjustment Role of stress on diabetes   Individualized Goals (developed by patient)   Nutrition Follow meal plan discussed  Physical Activity Exercise 3-5 times per week   Medications take my medication as prescribed   Monitoring  test blood glucose pre and post meals as discussed   Outcomes   Expected Outcomes Demonstrated interest in learning. Expect positive outcomes   Future DMSE PRN   Program Status Completed      Individualized Plan for Diabetes Self-Management Training:   Learning Objective:  Patient will have a greater understanding of diabetes self-management. Patient education plan is to attend individual and/or group sessions per assessed needs and concerns.   Plan:   Patient Instructions  Plan:  Aim for 3 Carb Choices per meal (45  grams) +/- 1 either way  Aim for 0-2 Carbs per snack if hungry  Include protein in moderation with your meals and snacks Consider reading food labels for Total Carbohydrate of foods Consider  increasing your activity level by swimming or Arm Chair Exercises daily as tolerated Continue checking BG at alternate times per day as directed by MD  Continue taking medication as directed by MD      Expected Outcomes:  Demonstrated interest in learning. Expect positive outcomes  Education material provided: Living Well with Diabetes, A1C conversion sheet, Meal plan card and Carbohydrate counting sheet  If problems or questions, patient to contact team via:  Phone and Email  Future DSME appointment: PRN

## 2015-04-16 ENCOUNTER — Other Ambulatory Visit: Payer: Self-pay | Admitting: Internal Medicine

## 2015-04-16 DIAGNOSIS — R109 Unspecified abdominal pain: Secondary | ICD-10-CM

## 2015-04-19 ENCOUNTER — Ambulatory Visit
Admission: RE | Admit: 2015-04-19 | Discharge: 2015-04-19 | Disposition: A | Discharge status: Home / Self Care | Payer: Medicare Other | Source: Ambulatory Visit | Attending: Internal Medicine | Admitting: Internal Medicine

## 2015-04-19 DIAGNOSIS — R109 Unspecified abdominal pain: Secondary | ICD-10-CM

## 2015-05-28 ENCOUNTER — Other Ambulatory Visit: Payer: Self-pay | Admitting: Neurological Surgery

## 2015-06-03 ENCOUNTER — Encounter (HOSPITAL_COMMUNITY): Payer: Self-pay

## 2015-06-03 ENCOUNTER — Encounter (HOSPITAL_COMMUNITY)
Admission: RE | Admit: 2015-06-03 | Discharge: 2015-06-03 | Disposition: A | Discharge status: Home / Self Care | Payer: Medicare Other | Source: Ambulatory Visit | Attending: Neurological Surgery | Admitting: Neurological Surgery

## 2015-06-03 DIAGNOSIS — E1122 Type 2 diabetes mellitus with diabetic chronic kidney disease: Secondary | ICD-10-CM | POA: Diagnosis not present

## 2015-06-03 DIAGNOSIS — M4806 Spinal stenosis, lumbar region: Secondary | ICD-10-CM | POA: Diagnosis not present

## 2015-06-03 DIAGNOSIS — N182 Chronic kidney disease, stage 2 (mild): Secondary | ICD-10-CM | POA: Insufficient documentation

## 2015-06-03 DIAGNOSIS — Z01818 Encounter for other preprocedural examination: Secondary | ICD-10-CM | POA: Diagnosis present

## 2015-06-03 DIAGNOSIS — Z79899 Other long term (current) drug therapy: Secondary | ICD-10-CM | POA: Diagnosis not present

## 2015-06-03 DIAGNOSIS — Z85828 Personal history of other malignant neoplasm of skin: Secondary | ICD-10-CM | POA: Insufficient documentation

## 2015-06-03 DIAGNOSIS — Z01812 Encounter for preprocedural laboratory examination: Secondary | ICD-10-CM | POA: Diagnosis not present

## 2015-06-03 DIAGNOSIS — I4510 Unspecified right bundle-branch block: Secondary | ICD-10-CM | POA: Insufficient documentation

## 2015-06-03 DIAGNOSIS — R9431 Abnormal electrocardiogram [ECG] [EKG]: Secondary | ICD-10-CM | POA: Insufficient documentation

## 2015-06-03 DIAGNOSIS — Z7982 Long term (current) use of aspirin: Secondary | ICD-10-CM | POA: Diagnosis not present

## 2015-06-03 DIAGNOSIS — M4804 Spinal stenosis, thoracic region: Secondary | ICD-10-CM | POA: Insufficient documentation

## 2015-06-03 DIAGNOSIS — I129 Hypertensive chronic kidney disease with stage 1 through stage 4 chronic kidney disease, or unspecified chronic kidney disease: Secondary | ICD-10-CM | POA: Diagnosis not present

## 2015-06-03 DIAGNOSIS — M48061 Spinal stenosis, lumbar region without neurogenic claudication: Secondary | ICD-10-CM

## 2015-06-03 LAB — CBC WITH DIFFERENTIAL/PLATELET
Basophils Absolute: 0 10*3/uL (ref 0.0–0.1)
Basophils Relative: 0 %
Eosinophils Absolute: 0.4 10*3/uL (ref 0.0–0.7)
Eosinophils Relative: 6 %
HCT: 43 % (ref 39.0–52.0)
Hemoglobin: 14.9 g/dL (ref 13.0–17.0)
Lymphocytes Relative: 34 %
Lymphs Abs: 1.9 10*3/uL (ref 0.7–4.0)
MCH: 30.2 pg (ref 26.0–34.0)
MCHC: 34.7 g/dL (ref 30.0–36.0)
MCV: 87 fL (ref 78.0–100.0)
Monocytes Absolute: 0.5 10*3/uL (ref 0.1–1.0)
Monocytes Relative: 8 %
Neutro Abs: 3 10*3/uL (ref 1.7–7.7)
Neutrophils Relative %: 52 %
Platelets: 204 10*3/uL (ref 150–400)
RBC: 4.94 MIL/uL (ref 4.22–5.81)
RDW: 13 % (ref 11.5–15.5)
WBC: 5.8 10*3/uL (ref 4.0–10.5)

## 2015-06-03 LAB — BASIC METABOLIC PANEL
Anion gap: 11 (ref 5–15)
BUN: 21 mg/dL — ABNORMAL HIGH (ref 6–20)
CO2: 25 mmol/L (ref 22–32)
Calcium: 9.9 mg/dL (ref 8.9–10.3)
Chloride: 103 mmol/L (ref 101–111)
Creatinine, Ser: 1.25 mg/dL — ABNORMAL HIGH (ref 0.61–1.24)
GFR calc Af Amer: >60 mL/min (ref >60)
GFR calc non Af Amer: 55 mL/min — ABNORMAL LOW (ref >60)
Glucose, Bld: 138 mg/dL — ABNORMAL HIGH (ref 65–99)
Potassium: 3.8 mmol/L (ref 3.5–5.1)
Sodium: 139 mmol/L (ref 135–145)

## 2015-06-03 LAB — SURGICAL PCR SCREEN
MRSA, PCR: NEGATIVE
Staphylococcus aureus: NEGATIVE

## 2015-06-03 LAB — PROTIME-INR
INR: 1.08 (ref 0.00–1.49)
Prothrombin Time: 14.2 seconds (ref 11.6–15.2)

## 2015-06-03 LAB — GLUCOSE, CAPILLARY: Glucose-Capillary: 136 mg/dL — ABNORMAL HIGH (ref 65–99)

## 2015-06-03 NOTE — Progress Notes (Signed)
Spoke with Willeen Cass, re: 20 years ago during shoulder surgery patient "flat lined".  Patient has had recent surgery done within [facility] system and states he has done fine.(back+ reverse shoulder)

## 2015-06-03 NOTE — Pre-Procedure Instructions (Signed)
Matthew [name]  06/03/2015      [id number] - [place], [place] [address] Phone: [phone] Fax: [phone]    Your procedure is scheduled on    Wednesday  06/09/15  Report to [facility] Admitting at 630 A.M.  Call this number if you have problems the morning of surgery:  [phone]   Remember:  Do not eat food or drink liquids after midnight.  Take these medicines the morning of surgery with A SIP OF WATER   AMLODIPINE (NORVASC)        (STOP ASPIRIN, NAPROXEN/ANAPROX, IBUPROFEN/ ADVIL/ MOTRIN, GOODY POWDERS, BC'S, HERBAL MEDICINES)   How to Manage Your Diabetes Before and After Surgery  Why is it important to control my blood sugar before and after surgery? . Improving blood sugar levels before and after surgery helps healing and can limit problems. . A way of improving blood sugar control is eating a healthy diet by: o  Eating less sugar and carbohydrates o  Increasing activity/exercise o  Talking with your doctor about reaching your blood sugar goals . High blood sugars (greater than 180 mg/dL) can raise your risk of infections and slow your recovery, so you will need to focus on controlling your diabetes during the weeks before surgery. . Make sure that the doctor who takes care of your diabetes knows about your planned surgery including the date and location.  How do I manage my blood sugar before surgery? . Check your blood sugar at least 4 times a day, starting 2 days before surgery, to make sure that the level is not too high or low. o Check your blood sugar the morning of your surgery when you wake up and every 2 hours until you get to the Short Stay unit. . If your blood sugar is less than 70 mg/dL, you will need to treat for low blood sugar: o Do not take insulin. o Treat a low blood sugar (less than 70 mg/dL) with  cup of clear juice (cranberry or apple), 4 glucose tablets, OR glucose  gel. o Recheck blood sugar in 15 minutes after treatment (to make sure it is greater than 70 mg/dL). If your blood sugar is not greater than 70 mg/dL on recheck, call [phone] for further instructions. . Report your blood sugar to the short stay nurse when you get to Short Stay.  . If you are admitted to the hospital after surgery: o Your blood sugar will be checked by the staff and you will probably be given insulin after surgery (instead of oral diabetes medicines) to make sure you have good blood sugar levels. o The goal for blood sugar control after surgery is 80-180 mg/dL.              WHAT DO I DO ABOUT MY DIABETES MEDICATION?   Marland Kitchen Do not take oral diabetes medicines (pills) the morning of surgery.      . The day of surgery, do not take other diabetes injectables, including Byetta (exenatide), Bydureon (exenatide ER), Victoza (liraglutide), or Trulicity (dulaglutide).  . If your CBG is greater than 220 mg/dL, you may take  of your sliding scale (correction) dose of insulin.  Other Instructions:          Patient Signature:  Date:   Nurse Signature:  Date:   Reviewed and Endorsed by [facility] Patient Education Committee, August 2015  Do not wear jewelry, make-up or nail polish.  Do not wear lotions, powders, or perfumes.  You may wear deodorant.  Do not shave 48 hours prior to surgery.  Men may shave face and neck.  Do not bring valuables to the hospital.  [facility] is not responsible for any belongings or valuables.  Contacts, dentures or bridgework may not be worn into surgery.  Leave your suitcase in the car.  After surgery it may be brought to your room.  For patients admitted to the hospital, discharge time will be determined by your treatment team.  Patients discharged the day of surgery will not be allowed to drive home.   Name and phone number of your driver:   Special instructions:  [facility] - Preparing for Surgery  Before surgery, you  can play an important role.  Because skin is not sterile, your skin needs to be as free of germs as possible.  You can reduce the number of germs on you skin by washing with CHG (chlorahexidine gluconate) soap before surgery.  CHG is an antiseptic cleaner which kills germs and bonds with the skin to continue killing germs even after washing.  Please DO NOT use if you have an allergy to CHG or antibacterial soaps.  If your skin becomes reddened/irritated stop using the CHG and inform your nurse when you arrive at Short Stay.  Do not shave (including legs and underarms) for at least 48 hours prior to the first CHG shower.  You may shave your face.  Please follow these instructions carefully:   1.  Shower with CHG Soap the night before surgery and the                                morning of Surgery.  2.  If you choose to wash your hair, wash your hair first as usual with your       normal shampoo.  3.  After you shampoo, rinse your hair and body thoroughly to remove the                      Shampoo.  4.  Use CHG as you would any other liquid soap.  You can apply chg directly       to the skin and wash gently with scrungie or a clean washcloth.  5.  Apply the CHG Soap to your body ONLY FROM THE NECK DOWN.        Do not use on open wounds or open sores.  Avoid contact with your eyes,       ears, mouth and genitals (private parts).  Wash genitals (private parts)       with your normal soap.  6.  Wash thoroughly, paying special attention to the area where your surgery        will be performed.  7.  Thoroughly rinse your body with warm water from the neck down.  8.  DO NOT shower/wash with your normal soap after using and rinsing off       the CHG Soap.  9.  Pat yourself dry with a clean towel.            10.  Wear clean pajamas.            11.  Place clean sheets on your bed the night of your first shower and do not        sleep with pets.  Day of Surgery  Do not apply any lotions/deoderants the  morning of surgery.  Please wear clean clothes to the hospital/surgery center.    Please read over the following fact sheets that you were given. Pain Booklet, Coughing and Deep Breathing, MRSA Information and Surgical Site Infection Prevention

## 2015-06-04 LAB — HEMOGLOBIN A1C
Hgb A1c MFr Bld: 8.3 % — ABNORMAL HIGH (ref 4.8–5.6)
Mean Plasma Glucose: 192 mg/dL

## 2015-06-07 NOTE — Progress Notes (Signed)
Anesthesia chart review: Patient is a 75 year old male scheduled for T12-L1 laminectomy on 06/09/2015 by Dr. Ronnald Ramp.  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   PCP is Dr. Wenda Low [facility]). He is not routinely followed by cardiology, but was evaluated at [facility] by Dr. Vear Clock in 01/2012 as part of a pre-operative evaluation for PLIF.  His anesthesia history previously documented from 2013 stated, "Pt said that he went 'flat line' during knee surgery in 2009 due to 'Too much anesthesia'. The following surgery he could hear therm talking and using a hammer--'too little anesthesia.' The last surgery was just right." These were all done at [facility]. Prior to his PLIF in 01/2012, I reviewed anesthesia records received from 2008-2009 (records scanned under Media tab, Correspondence 02/02/12). Notes indicate that he reported that in 1987 he received "so much meds that they 'lost him on the table'" during his left shoulder arthroscopy at a satellite out[facility]patient surgical center. He was kept overnight. He reportedly had no problems with a right hand surgery in 2005. In February 2009 he underwent right TKR with "SAB c Lumbar Plexus, propofol/ketamine infusion, no complications." Patient reported he could hear them talking and using a hammer. In 01/06/08 he underwent left TKR with regional and spinal without incident. Anesthesia records from 02/02/12 and 12/06/12 are in Epic for review as needed.  Meds include amlodipine, ASA 81mg , losartan-HCTZ.  06/03/15 EKG: NSR, right BBB. 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   01/12/12 Nuclear stress test [facility]; scanned under Media tab, Correspondence 02/02/12): Rest EKG showed normal sinus rhythm, right bundle branch  block. Stress EKG was equivocal for ischemia with development of additional 1 mm ST depression in V2 through V5. He was hypertensive at rest and stress. He was asymptomatic. Normal myocardial perfusion imaging study without evidence of ischemia or scar and normal left ventricular ejection fraction, 77%. This represents a low risk study.   06/03/15 CXR: IMPRESSION: No acute findings.   Preoperative labs noted. Cr 1.25. Glucose 138. A1c 8.3 consistent with mean plasma glucose of 192. CBC, PT/INR WNL.   If no acute changes then I would anticipate that he could proceed as planned.  [facility] Short Stay Center/Anesthesiology Phone [phone] 06/07/2015 12:42 PM

## 2015-06-09 ENCOUNTER — Ambulatory Visit (HOSPITAL_COMMUNITY)
Admission: RE | Admit: 2015-06-09 | Discharge: 2015-06-10 | Disposition: A | Discharge status: Home / Self Care | Payer: Medicare Other | Source: Ambulatory Visit | Attending: Neurological Surgery | Admitting: Neurological Surgery

## 2015-06-09 ENCOUNTER — Ambulatory Visit (HOSPITAL_COMMUNITY): Payer: Medicare Other | Admitting: Anesthesiology

## 2015-06-09 ENCOUNTER — Encounter (HOSPITAL_COMMUNITY): Payer: Self-pay | Admitting: Neurological Surgery

## 2015-06-09 ENCOUNTER — Ambulatory Visit (HOSPITAL_COMMUNITY): Payer: Medicare Other | Admitting: Emergency Medicine

## 2015-06-09 ENCOUNTER — Encounter (HOSPITAL_COMMUNITY)
Admission: RE | Disposition: A | Discharge status: Home / Self Care | Payer: Self-pay | Source: Ambulatory Visit | Attending: Neurological Surgery

## 2015-06-09 ENCOUNTER — Ambulatory Visit (HOSPITAL_COMMUNITY): Payer: Medicare Other

## 2015-06-09 DIAGNOSIS — Z7984 Long term (current) use of oral hypoglycemic drugs: Secondary | ICD-10-CM | POA: Insufficient documentation

## 2015-06-09 DIAGNOSIS — E1122 Type 2 diabetes mellitus with diabetic chronic kidney disease: Secondary | ICD-10-CM | POA: Insufficient documentation

## 2015-06-09 DIAGNOSIS — G9589 Other specified diseases of spinal cord: Secondary | ICD-10-CM | POA: Insufficient documentation

## 2015-06-09 DIAGNOSIS — G959 Disease of spinal cord, unspecified: Secondary | ICD-10-CM | POA: Diagnosis present

## 2015-06-09 DIAGNOSIS — Z7982 Long term (current) use of aspirin: Secondary | ICD-10-CM | POA: Diagnosis not present

## 2015-06-09 DIAGNOSIS — M4804 Spinal stenosis, thoracic region: Secondary | ICD-10-CM | POA: Insufficient documentation

## 2015-06-09 DIAGNOSIS — I129 Hypertensive chronic kidney disease with stage 1 through stage 4 chronic kidney disease, or unspecified chronic kidney disease: Secondary | ICD-10-CM | POA: Diagnosis not present

## 2015-06-09 DIAGNOSIS — N182 Chronic kidney disease, stage 2 (mild): Secondary | ICD-10-CM | POA: Diagnosis not present

## 2015-06-09 DIAGNOSIS — M549 Dorsalgia, unspecified: Secondary | ICD-10-CM

## 2015-06-09 HISTORY — PX: LUMBAR LAMINECTOMY/DECOMPRESSION MICRODISCECTOMY: SHX5026

## 2015-06-09 LAB — GLUCOSE, CAPILLARY
Glucose-Capillary: 153 mg/dL — ABNORMAL HIGH (ref 65–99)
Glucose-Capillary: 159 mg/dL — ABNORMAL HIGH (ref 65–99)
Glucose-Capillary: 150 mg/dL — ABNORMAL HIGH (ref 65–99)
Glucose-Capillary: 214 mg/dL — ABNORMAL HIGH (ref 65–99)

## 2015-06-09 SURGERY — LUMBAR LAMINECTOMY/DECOMPRESSION MICRODISCECTOMY 1 LEVEL
Anesthesia: General | Site: Back

## 2015-06-09 MED ORDER — LIDOCAINE HCL (CARDIAC) 20 MG/ML IV SOLN
INTRAVENOUS | Status: DC | PRN
  Administered 2015-06-09: 80 mg via INTRAVENOUS

## 2015-06-09 MED ORDER — THROMBIN 5000 UNITS EX SOLR
CUTANEOUS | Status: DC | PRN
  Administered 2015-06-09 (×2): 5000 [IU] via TOPICAL

## 2015-06-09 MED ORDER — FENTANYL CITRATE (PF) 100 MCG/2ML IJ SOLN
INTRAMUSCULAR | Status: DC | PRN
  Administered 2015-06-09 (×2): 50 ug via INTRAVENOUS

## 2015-06-09 MED ORDER — PHENOL 1.4 % MT LIQD
1.0000 | OROMUCOSAL | Status: DC | PRN
Start: 2015-06-09 — End: 2015-06-10

## 2015-06-09 MED ORDER — ONDANSETRON HCL 4 MG/2ML IJ SOLN
INTRAMUSCULAR | Status: AC
  Filled 2015-06-09: qty 2

## 2015-06-09 MED ORDER — DEXAMETHASONE SODIUM PHOSPHATE 10 MG/ML IJ SOLN: 10.0000 mg | INTRAMUSCULAR | Status: DC

## 2015-06-09 MED ORDER — MENTHOL 3 MG MT LOZG: 1.0000 | LOZENGE | OROMUCOSAL | Status: DC | PRN

## 2015-06-09 MED ORDER — LACTATED RINGERS IV SOLN
INTRAVENOUS | Status: DC | PRN
  Administered 2015-06-09 (×2): via INTRAVENOUS

## 2015-06-09 MED ORDER — ONDANSETRON HCL 4 MG/2ML IJ SOLN
INTRAMUSCULAR | Status: DC | PRN
  Administered 2015-06-09: 4 mg via INTRAVENOUS

## 2015-06-09 MED ORDER — METHOCARBAMOL 1000 MG/10ML IJ SOLN
500.0000 mg | Freq: Four times a day (QID) | INTRAVENOUS | Status: DC | PRN
  Filled 2015-06-09: qty 5

## 2015-06-09 MED ORDER — LOSARTAN POTASSIUM-HCTZ 100-12.5 MG PO TABS: 1.0000 | ORAL_TABLET | Freq: Every day | ORAL | Status: DC

## 2015-06-09 MED ORDER — FENTANYL CITRATE (PF) 250 MCG/5ML IJ SOLN
INTRAMUSCULAR | Status: AC
  Filled 2015-06-09: qty 5

## 2015-06-09 MED ORDER — SUGAMMADEX SODIUM 200 MG/2ML IV SOLN
INTRAVENOUS | Status: AC
  Filled 2015-06-09: qty 2

## 2015-06-09 MED ORDER — CEFAZOLIN SODIUM-DEXTROSE 2-4 GM/100ML-% IV SOLN
2.0000 g | INTRAVENOUS | Status: AC
  Administered 2015-06-09: 2 g via INTRAVENOUS

## 2015-06-09 MED ORDER — CEFAZOLIN SODIUM-DEXTROSE 2-4 GM/100ML-% IV SOLN
INTRAVENOUS | Status: AC
  Filled 2015-06-09: qty 100

## 2015-06-09 MED ORDER — NAPROXEN SODIUM 220 MG PO TABS: 220.0000 mg | ORAL_TABLET | Freq: Every day | ORAL | Status: DC | PRN

## 2015-06-09 MED ORDER — EPHEDRINE SULFATE 50 MG/ML IJ SOLN
INTRAMUSCULAR | Status: DC | PRN
  Administered 2015-06-09: 5 mg via INTRAVENOUS
  Administered 2015-06-09: 10 mg via INTRAVENOUS

## 2015-06-09 MED ORDER — ONDANSETRON HCL 4 MG/2ML IJ SOLN: 4.0000 mg | Freq: Once | INTRAMUSCULAR | Status: DC | PRN

## 2015-06-09 MED ORDER — POTASSIUM CHLORIDE IN NACL 20-0.9 MEQ/L-% IV SOLN
INTRAVENOUS | Status: DC
  Filled 2015-06-09 (×3): qty 1000

## 2015-06-09 MED ORDER — HEMOSTATIC AGENTS (NO CHARGE) OPTIME
TOPICAL | Status: DC | PRN
  Administered 2015-06-09: 1 via TOPICAL

## 2015-06-09 MED ORDER — SODIUM CHLORIDE 0.9% FLUSH
3.0000 mL | Freq: Two times a day (BID) | INTRAVENOUS | Status: DC
  Administered 2015-06-09 (×2): 3 mL via INTRAVENOUS

## 2015-06-09 MED ORDER — HYDROMORPHONE HCL 1 MG/ML IJ SOLN: 0.2500 mg | INTRAMUSCULAR | Status: DC | PRN

## 2015-06-09 MED ORDER — NAPROXEN SODIUM 275 MG PO TABS
275.0000 mg | ORAL_TABLET | Freq: Every day | ORAL | Status: DC | PRN
  Filled 2015-06-09: qty 1

## 2015-06-09 MED ORDER — INSULIN ASPART 100 UNIT/ML ~~LOC~~ SOLN
0.0000 [IU] | Freq: Three times a day (TID) | SUBCUTANEOUS | Status: DC
  Administered 2015-06-10: 3 [IU] via SUBCUTANEOUS

## 2015-06-09 MED ORDER — ONDANSETRON HCL 4 MG/2ML IJ SOLN: 4.0000 mg | INTRAMUSCULAR | Status: DC | PRN

## 2015-06-09 MED ORDER — SODIUM CHLORIDE 0.9% FLUSH: 3.0000 mL | INTRAVENOUS | Status: DC | PRN

## 2015-06-09 MED ORDER — METHOCARBAMOL 500 MG PO TABS: 500.0000 mg | ORAL_TABLET | Freq: Four times a day (QID) | ORAL | Status: DC | PRN

## 2015-06-09 MED ORDER — SUGAMMADEX SODIUM 200 MG/2ML IV SOLN
INTRAVENOUS | Status: DC | PRN
  Administered 2015-06-09: 181.2 mg via INTRAVENOUS

## 2015-06-09 MED ORDER — ROCURONIUM BROMIDE 50 MG/5ML IV SOLN
INTRAVENOUS | Status: AC
  Filled 2015-06-09: qty 1

## 2015-06-09 MED ORDER — HYDROCHLOROTHIAZIDE 12.5 MG PO CAPS
12.5000 mg | ORAL_CAPSULE | Freq: Every day | ORAL | Status: DC
  Administered 2015-06-09 – 2015-06-10 (×2): 12.5 mg via ORAL
  Filled 2015-06-09 (×2): qty 1

## 2015-06-09 MED ORDER — LOSARTAN POTASSIUM 50 MG PO TABS
100.0000 mg | ORAL_TABLET | Freq: Every day | ORAL | Status: DC
  Administered 2015-06-09 – 2015-06-10 (×2): 100 mg via ORAL
  Filled 2015-06-09 (×2): qty 2

## 2015-06-09 MED ORDER — PROPOFOL 10 MG/ML IV BOLUS
INTRAVENOUS | Status: DC | PRN
  Administered 2015-06-09: 150 mg via INTRAVENOUS

## 2015-06-09 MED ORDER — SODIUM CHLORIDE 0.9 % IR SOLN
Status: DC | PRN
  Administered 2015-06-09: 500 mL

## 2015-06-09 MED ORDER — DIPHENHYDRAMINE HCL 25 MG PO TABS
25.0000 mg | ORAL_TABLET | Freq: Every day | ORAL | Status: DC | PRN
  Filled 2015-06-09: qty 1

## 2015-06-09 MED ORDER — ACETAMINOPHEN 325 MG PO TABS: 650.0000 mg | ORAL_TABLET | ORAL | Status: DC | PRN

## 2015-06-09 MED ORDER — CEFAZOLIN SODIUM 1-5 GM-% IV SOLN
1.0000 g | Freq: Three times a day (TID) | INTRAVENOUS | Status: AC
  Administered 2015-06-09 – 2015-06-10 (×2): 1 g via INTRAVENOUS
  Filled 2015-06-09: qty 50

## 2015-06-09 MED ORDER — PROPOFOL 10 MG/ML IV BOLUS
INTRAVENOUS | Status: AC
  Filled 2015-06-09: qty 40

## 2015-06-09 MED ORDER — ROCURONIUM BROMIDE 100 MG/10ML IV SOLN
INTRAVENOUS | Status: DC | PRN
  Administered 2015-06-09: 40 mg via INTRAVENOUS

## 2015-06-09 MED ORDER — HYDROMORPHONE HCL 2 MG PO TABS
2.0000 mg | ORAL_TABLET | ORAL | Status: DC | PRN
Start: 2015-06-09 — End: 2015-06-10
  Administered 2015-06-10: 2 mg via ORAL
  Filled 2015-06-09: qty 1

## 2015-06-09 MED ORDER — AMLODIPINE BESYLATE 5 MG PO TABS
5.0000 mg | ORAL_TABLET | Freq: Every day | ORAL | Status: DC
  Administered 2015-06-09: 5 mg via ORAL
  Filled 2015-06-09 (×2): qty 1

## 2015-06-09 MED ORDER — EPHEDRINE 5 MG/ML INJ
INTRAVENOUS | Status: AC
  Filled 2015-06-09: qty 10

## 2015-06-09 MED ORDER — 0.9 % SODIUM CHLORIDE (POUR BTL) OPTIME
TOPICAL | Status: DC | PRN
  Administered 2015-06-09: 1000 mL

## 2015-06-09 MED ORDER — BUPIVACAINE HCL (PF) 0.25 % IJ SOLN
INTRAMUSCULAR | Status: DC | PRN
  Administered 2015-06-09: 10 mL

## 2015-06-09 MED ORDER — HYDROMORPHONE HCL 1 MG/ML IJ SOLN: 0.5000 mg | INTRAMUSCULAR | Status: DC | PRN

## 2015-06-09 MED ORDER — ACETAMINOPHEN 650 MG RE SUPP: 650.0000 mg | RECTAL | Status: DC | PRN

## 2015-06-09 MED ORDER — INSULIN ASPART 100 UNIT/ML ~~LOC~~ SOLN
0.0000 [IU] | Freq: Every day | SUBCUTANEOUS | Status: DC
  Administered 2015-06-09: 2 [IU] via SUBCUTANEOUS

## 2015-06-09 SURGICAL SUPPLY — 38 items
BAG DECANTER FOR FLEXI CONT (MISCELLANEOUS) ×2 IMPLANT
BENZOIN TINCTURE PRP APPL 2/3 (GAUZE/BANDAGES/DRESSINGS) ×2 IMPLANT
BUR MATCHSTICK NEURO 3.0 LAGG (BURR) ×2 IMPLANT
CANISTER SUCT 3000ML PPV (MISCELLANEOUS) ×2 IMPLANT
DRAPE LAPAROTOMY 100X72X124 (DRAPES) ×2 IMPLANT
DRAPE MICROSCOPE LEICA (MISCELLANEOUS) ×2 IMPLANT
DRAPE POUCH INSTRU U-SHP 10X18 (DRAPES) ×2 IMPLANT
DRAPE SURG 17X23 STRL (DRAPES) ×2 IMPLANT
DRSG OPSITE POSTOP 3X4 (GAUZE/BANDAGES/DRESSINGS) ×2 IMPLANT
DURAPREP 26ML APPLICATOR (WOUND CARE) ×2 IMPLANT
ELECT REM PT RETURN 9FT ADLT (ELECTROSURGICAL) ×2
ELECTRODE REM PT RTRN 9FT ADLT (ELECTROSURGICAL) ×1 IMPLANT
GAUZE SPONGE 4X4 16PLY XRAY LF (GAUZE/BANDAGES/DRESSINGS) IMPLANT
GLOVE BIO SURGEON STRL SZ8 (GLOVE) ×2 IMPLANT
GOWN STRL REUS W/ TWL LRG LVL3 (GOWN DISPOSABLE) IMPLANT
GOWN STRL REUS W/ TWL XL LVL3 (GOWN DISPOSABLE) ×1 IMPLANT
GOWN STRL REUS W/TWL 2XL LVL3 (GOWN DISPOSABLE) IMPLANT
GOWN STRL REUS W/TWL LRG LVL3 (GOWN DISPOSABLE)
GOWN STRL REUS W/TWL XL LVL3 (GOWN DISPOSABLE) ×1
HEMOSTAT POWDER KIT SURGIFOAM (HEMOSTASIS) IMPLANT
KIT BASIN OR (CUSTOM PROCEDURE TRAY) ×2 IMPLANT
KIT ROOM TURNOVER OR (KITS) ×2 IMPLANT
NEEDLE HYPO 25X1 1.5 SAFETY (NEEDLE) ×2 IMPLANT
NEEDLE SPNL 20GX3.5 QUINCKE YW (NEEDLE) IMPLANT
NS IRRIG 1000ML POUR BTL (IV SOLUTION) ×2 IMPLANT
PACK LAMINECTOMY NEURO (CUSTOM PROCEDURE TRAY) ×2 IMPLANT
PAD ARMBOARD 7.5X6 YLW CONV (MISCELLANEOUS) ×6 IMPLANT
RUBBERBAND STERILE (MISCELLANEOUS) ×4 IMPLANT
SPONGE SURGIFOAM ABS GEL SZ50 (HEMOSTASIS) ×2 IMPLANT
STRIP CLOSURE SKIN 1/2X4 (GAUZE/BANDAGES/DRESSINGS) ×2 IMPLANT
SUT VIC AB 0 CT1 18XCR BRD8 (SUTURE) ×1 IMPLANT
SUT VIC AB 0 CT1 8-18 (SUTURE) ×1
SUT VIC AB 2-0 CP2 18 (SUTURE) ×2 IMPLANT
SUT VIC AB 3-0 SH 8-18 (SUTURE) ×2 IMPLANT
TAPE STRIPS DRAPE STRL (GAUZE/BANDAGES/DRESSINGS) ×2 IMPLANT
TOWEL OR 17X24 6PK STRL BLUE (TOWEL DISPOSABLE) ×2 IMPLANT
TOWEL OR 17X26 10 PK STRL BLUE (TOWEL DISPOSABLE) ×2 IMPLANT
WATER STERILE IRR 1000ML POUR (IV SOLUTION) ×2 IMPLANT

## 2015-06-09 NOTE — H&P (Signed)
Subjective: Patient is a 75 y.o. male admitted for myelopathy. Onset of symptoms was a few months ago, gradually worsening since that time.  The pain is rated moderate, and is located at the across the lower back and radiates to legs. He has noted a change in gait. The pain is described as aching and occurs intermittently. The symptoms have been progressive. Symptoms are exacerbated by exercise. MRI or CT showed stenosis with signal change in cord T12-L1   Past Medical History  Diagnosis Date  . Dysrhythmia     has been irregular at times since age 33.  . Constipation   . Kidney stones 2011    multiple times.  . Chronic kidney disease     Stage II, per Dr. 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 Complication of anesthesia     "Too much with shoulder surgery", pt. reports that he was told the at they "lost him, due to absorbing too much anesthesia". Not enough for  Right ,  Left knee  just right.  . Hypertension     treated/managed by Dr. Lysle Rubens, [facility]   . Cancer ([place])     bridge of nose- basal cell  . Arthritis     Shoulder, knees, back  . Diabetes mellitus     type 2    Past Surgical History  Procedure Laterality Date  . Knee surgery  2009    Right- first arthroscopy, then partial replacement- [facility]   . Shoulder surgery  1985    L arthroscopic   . [facility] also had [facility]ur[facility]   . Eye surgery      Cataract Left eye  . Fisture       x 2 anal fissure  . Posterior lumbar fusion  02/02/2012  . Hand surgery      x2 for ligament rebuilt  . Reverse shoulder arthroplasty Left 12/06/2012    Dr Veverly Fells  . Total shoulder arthroplasty Left 12/06/2012    Procedure: LEFT TOTAL SHOULDER ARTHROPLASTY VERSES A REVERSE TOTAL SHOULDER ARTHROPLASTY;  Surgeon: Augustin Schooling, MD;  Location: [place];  Service: Orthopedics;  Laterality: Left;  . Lithotripsy      Prior to Admission medications   Medication Sig Start  Date End Date Taking? Authorizing Provider  amLODipine (NORVASC) 5 MG tablet Take 5 mg by mouth daily.   Yes Historical Provider, MD  aspirin EC 81 MG tablet Take 81 mg by mouth daily.   Yes Historical Provider, MD  diphenhydrAMINE (BENADRYL) 25 MG tablet Take 25 mg by mouth daily as needed for itching.    Yes Historical Provider, MD  losartan-hydrochlorothiazide (HYZAAR) 100-12.5 MG per tablet Take 1 tablet by mouth daily before breakfast.    Yes Historical Provider, MD  naproxen sodium (ANAPROX) 220 MG tablet Take 220 mg by mouth daily as needed (for pain).   Yes Historical Provider, MD   Allergies  Allergen Reactions  . Oxycodone Itching  . Codeine Itching  . Morphine And Related Itching    Social History  Substance Use Topics  . Smoking status: Never Smoker   . Smokeless tobacco: Never Used  . Alcohol Use: No    History reviewed. No pertinent family history.   Review of Systems  Positive ROS: neg  All other systems have been reviewed and were otherwise negative with the exception of those mentioned in the HPI and as above.  Objective: Vital signs in last  24 hours:    General Appearance: Alert, cooperative, no distress, appears stated age Head: Normocephalic, without obvious abnormality, atraumatic Eyes: PERRL, conjunctiva/corneas clear, EOM's intact    Neck: Supple, symmetrical, trachea midline Back: Symmetric, no curvature, ROM normal, no CVA tenderness Lungs:  respirations unlabored Heart: Regular rate and rhythm Abdomen: Soft, non-tender Extremities: Extremities normal, atraumatic, no cyanosis or edema Pulses: 2+ and symmetric all extremities Skin: Skin color, texture, turgor normal, no rashes or lesions  NEUROLOGIC:   Mental status: Alert and oriented x4,  no aphasia, good attention span, fund of knowledge, and memory Motor Exam - grossly normal Sensory Exam - grossly normal Reflexes: increased in LE Coordination - grossly normal Gait - spastic Balance - not  tested Cranial Nerves: I: smell Not tested  II: visual acuity  OS: nl    OD: nl  II: visual fields Full to confrontation  II: pupils Equal, round, reactive to light  III,VII: ptosis None  III,IV,VI: extraocular muscles  Full ROM  V: mastication Normal  V: facial light touch sensation  Normal  V,VII: corneal reflex  Present  VII: facial muscle function - upper  Normal  VII: facial muscle function - lower Normal  VIII: hearing Not tested  IX: soft palate elevation  Normal  IX,X: gag reflex Present  XI: trapezius strength  5/5  XI: sternocleidomastoid strength 5/5  XI: neck flexion strength  5/5  XII: tongue strength  Normal    Data Review Lab Results  Component Value Date   WBC 5.8 06/03/2015   HGB 14.9 06/03/2015   HCT 43.0 06/03/2015   MCV 87.0 06/03/2015   PLT 204 06/03/2015   Lab Results  Component Value Date   NA 139 06/03/2015   K 3.8 06/03/2015   CL 103 06/03/2015   CO2 25 06/03/2015   BUN 21* 06/03/2015   CREATININE 1.25* 06/03/2015   GLUCOSE 138* 06/03/2015   Lab Results  Component Value Date   INR 1.08 06/03/2015    Assessment/Plan: Patient admitted for T12-L1 laminectomy. Patient has failed a reasonable attempt at conservative therapy.  I explained the condition and procedure to the patient and answered any questions.  Patient wishes to proceed with procedure as planned. Understands risks/ benefits and typical outcomes of procedure.   Matthew [name] S 06/09/2015 6:47 AM

## 2015-06-09 NOTE — Anesthesia Procedure Notes (Signed)
Procedure Name: Intubation Date/Time: 06/09/2015 9:05 AM Performed by: Scheryl Darter Pre-anesthesia Checklist: Patient identified, Emergency Drugs available, Suction available and Patient being monitored Patient Re-evaluated:Patient Re-evaluated prior to inductionOxygen Delivery Method: Circle System Utilized Preoxygenation: Pre-oxygenation with 100% oxygen Intubation Type: IV induction Ventilation: Mask ventilation without difficulty Laryngoscope Size: Miller and 3 Grade View: Grade II Tube type: Oral Number of attempts: 1 Airway Equipment and Method: Stylet and Oral airway Placement Confirmation: ETT inserted through vocal cords under direct vision,  positive ETCO2 and breath sounds checked- equal and bilateral Secured at: 23 cm Tube secured with: Tape Dental Injury: Teeth and Oropharynx as per pre-operative assessment

## 2015-06-09 NOTE — Anesthesia Preprocedure Evaluation (Addendum)
Anesthesia Evaluation  Patient identified by MRN, date of birth, ID band Patient awake    Reviewed: Allergy & Precautions, H&P , NPO status , Patient's Chart, lab work & pertinent test results  Airway Mallampati: I  TM Distance: >3 FB Neck ROM: Full    Dental  (+) Teeth Intact   Pulmonary    Pulmonary exam normal breath sounds clear to auscultation       Cardiovascular hypertension, Pt. on medications + dysrhythmias  Rhythm:Regular Rate:Normal  known right bundle branch block  2013 stress negative: Normal myocardial perfusion imaging study without evidence of ischemia or scar and normal left ventricular ejection fraction, 77%. This represents a low risk study   Neuro/Psych    GI/Hepatic   Endo/Other  diabetes, Poorly Controlled, Type 2, Oral Hypoglycemic Agents  Renal/GU Renal InsufficiencyRenal disease     Musculoskeletal  (+) Arthritis ,   Abdominal   Peds  Hematology   Anesthesia Other Findings   Reproductive/Obstetrics                          Anesthesia Physical  Anesthesia Plan  ASA: III  Anesthesia Plan: General   Post-op Pain Management:    Induction: Intravenous  Airway Management Planned: Oral ETT  Additional Equipment:   Intra-op Plan:   Post-operative Plan: Extubation in OR  Informed Consent: I have reviewed the patients History and Physical, chart, labs and discussed the procedure including the risks, benefits and alternatives for the proposed anesthesia with the patient or authorized representative who has indicated his/her understanding and acceptance.     Plan Discussed with: CRNA and Surgeon  Anesthesia Plan Comments: (Previous grade 1 view with Mac 4, has needed dextrose amps prior during surgery Decadron, acetaminophen, zofran intraop if able)       Anesthesia Quick Evaluation

## 2015-06-09 NOTE — Transfer of Care (Signed)
Immediate Anesthesia Transfer of Care Note  Patient: Matthew [name]  Procedure(s) Performed: Procedure(s) with comments: Laminectomy - T12-L1 (N/A) - Laminectomy - T12-L1  Patient Location: PACU  Anesthesia Type:General  Level of Consciousness: awake, alert , oriented and sedated  Airway & Oxygen Therapy: Patient Spontanous Breathing and Patient connected to nasal cannula oxygen  Post-op Assessment: Report given to RN, Post -op Vital signs reviewed and stable and Patient moving all extremities  Post vital signs: Reviewed and stable  Last Vitals:  Filed Vitals:   06/09/15 0749  BP: 175/68  Pulse: 62  Temp: 36.6 C  Resp: 20    Last Pain:  Filed Vitals:   06/09/15 0751  PainSc: 2          Complications: No apparent anesthesia complications

## 2015-06-09 NOTE — Evaluation (Signed)
Physical Therapy Evaluation and Discharge Patient Details Name: Matthew [name] MRN: [id number] DOB: 09-17-1940 Today's Date: 06/09/2015   History of Present Illness  patient is a 75 yr old male s/p T12-L1 laminectomy  Clinical Impression  Patient seen for evaluation and education. Patient mobilizing well, educated extensively regarding mobility, positioning, precautions, car transfers and expectations for discharge. Patient receptive and appreciative. No further acute PT needs at this time, encouraged continued mobility.    Follow Up Recommendations No PT follow up    Equipment Recommendations  None recommended by PT    Recommendations for Other Services       Precautions / Restrictions Precautions Precautions: Back Precaution Booklet Issued: Yes (comment) Precaution Comments: verbally reviewed precautions with patient, good carry over      Mobility  Bed Mobility Overal bed mobility: Modified Independent                Transfers Overall transfer level: Independent Equipment used: None             General transfer comment: no physical assist required  Ambulation/Gait Ambulation/Gait assistance: Independent Ambulation Distance (Feet): 340 Feet Assistive device: None Gait Pattern/deviations: Step-through pattern;Decreased stride length     General Gait Details: modest instability noted, but mobilizing well  Stairs            Wheelchair Mobility    Modified Rankin (Stroke Patients Only)       Balance Overall balance assessment: No apparent balance deficits (not formally assessed)                                           Pertinent Vitals/Pain Pain Assessment: 0-10 Pain Score: 3  Pain Location: back Pain Descriptors / Indicators: Sore Pain Intervention(s): Monitored during session    Home Living Family/patient expects to be discharged to:: Private residence Living Arrangements: Spouse/significant other Available Help at  Discharge: Family Type of Home: House Home Access: Stairs to enter   Technical brewer of Steps: 2 Home Layout: One level Home Equipment: None      Prior Function Level of Independence: Independent               Hand Dominance   Dominant Hand: Right    Extremity/Trunk Assessment   Upper Extremity Assessment: Overall WFL for tasks assessed           Lower Extremity Assessment: Overall WFL for tasks assessed         Communication   Communication: No difficulties  Cognition Arousal/Alertness: Awake/alert Behavior During Therapy: WFL for tasks assessed/performed Overall Cognitive Status: Within Functional Limits for tasks assessed                      General Comments General comments (skin integrity, edema, etc.): educated extensively regarding mobility, positioning, precautions, car transfers and expectations for discharge. patient receptive and appreciative.     Exercises        Assessment/Plan    PT Assessment Patent does not need any further PT services  PT Diagnosis Difficulty walking;Acute pain   PT Problem List    PT Treatment Interventions     PT Goals (Current goals can be found in the Care Plan section) Acute Rehab PT Goals PT Goal Formulation: All assessment and education complete, DC therapy    Frequency     Barriers to discharge  Co-evaluation               End of Session   Activity Tolerance: Patient tolerated treatment well Patient left: in bed;with call bell/phone within reach Nurse Communication: Mobility status    Functional Assessment Tool Used: clinical judgement Functional Limitation: Mobility: Walking and moving around Mobility: Walking and Moving Around Current Status [id number]): At least 1 percent but less than 20 percent impaired, limited or restricted Mobility: Walking and Moving Around Goal Status [phone]): At least 1 percent but less than 20 percent impaired, limited or restricted Mobility:  Walking and Moving Around Discharge Status [phone]): At least 1 percent but less than 20 percent impaired, limited or restricted    Time: 1341-1359 PT Time Calculation (min) (ACUTE ONLY): 18 min   Charges:   PT Evaluation $PT Eval Low Complexity: 1 Procedure     PT G Codes:   PT G-Codes **NOT FOR INPATIENT CLASS** Functional Assessment Tool Used: clinical judgement Functional Limitation: Mobility: Walking and moving around Mobility: Walking and Moving Around Current Status [id number]): At least 1 percent but less than 20 percent impaired, limited or restricted Mobility: Walking and Moving Around Goal Status [id number]): At least 1 percent but less than 20 percent impaired, limited or restricted Mobility: Walking and Moving Around Discharge Status [phone]): At least 1 percent but less than 20 percent impaired, limited or restricted    [name] Dull 06/09/2015, 2:09 PM  Alben Deeds, [place] DPT  [phone]

## 2015-06-09 NOTE — Op Note (Signed)
06/09/2015  9:54 AM  PATIENT:  Matthew [name]  75 y.o. male  PRE-OPERATIVE DIAGNOSIS:  thoracic stenosis with myelopathy  POST-OPERATIVE DIAGNOSIS:  same  PROCEDURE:  T12-L1 thoracic laminectomy, medial facetectomy and foraminotomy for canal decompression  SURGEON:  Sherley Bounds, MD  ASSISTANTS: none  ANESTHESIA:   General  EBL: Less than 50 ml     BLOOD ADMINISTERED:none  DRAINS: none  SPECIMEN:  No Specimen  INDICATION FOR PROCEDURE: This patient presented with change in gait and numbness in his legs. He had an MRI which showed severe spinal stenosis at T12-L1 with cord compression and signal change in the cord. I recommended decompressive thoracic laminectomy. Patient understood the risks, benefits, and alternatives and potential outcomes and wished to proceed.  PROCEDURE DETAILS: The patient was taken to the operating room and after induction of adequate generalized endotracheal anesthesia, the patient was rolled into the prone position on the Wilson frame and all pressure points were padded. The lumbar region was cleaned and then prepped with DuraPrep and draped in the usual sterile fashion. 5 cc of local anesthesia was injected and then a dorsal midline incision was made and carried down to the thoraco lumbar fascia. The fascia was opened and the paraspinous musculature was taken down in a subperiosteal fashion to expose T12-L1 bilaterally.  Intraoperative x-ray confirmed my level, and then I used a combination of the high-speed drill and the Kerrison punches to perform a hemilaminectomy, medial facetectomy, and foraminotomy at T12-L1 bilaterally . The underlying yellow ligament was opened and removed in a piecemeal fashion to expose the underlying dura and exiting nerve root. I undercut the lateral recess and dissected down until I was medial to and distal to the L1 pedicle. There was severe compression laterally from the overgrown facets and yellow ligament. I then palpated with a  coronary dilator along the nerve root and into the foramen to assure adequate decompression. I felt no more compression of the nerve root. I irrigated with saline solution containing bacitracin. Achieved hemostasis with bipolar cautery, lined the dura with Gelfoam, and then closed the fascia with 0 Vicryl. I closed the subcutaneous tissues with 2-0 Vicryl and the subcuticular tissues with 3-0 Vicryl. The skin was then closed with benzoin and Steri-Strips. The drapes were removed, a sterile dressing was applied. The patient was awakened from general anesthesia and transferred to the recovery room in stable condition. At the end of the procedure all sponge, needle and instrument counts were correct.   PLAN OF CARE: Admit for overnight observation  PATIENT DISPOSITION:  PACU - hemodynamically stable.   Delay start of Pharmacological VTE agent (>24hrs) due to surgical blood loss or risk of bleeding:  yes

## 2015-06-09 NOTE — Anesthesia Postprocedure Evaluation (Signed)
Anesthesia Post Note  Patient: Matthew [name]  Procedure(s) Performed: Procedure(s) (LRB): Laminectomy - T12-L1 (N/A)  Patient location during evaluation: PACU Anesthesia Type: General Level of consciousness: awake and alert Pain management: pain level controlled Vital Signs Assessment: post-procedure vital signs reviewed and stable Respiratory status: spontaneous breathing, nonlabored ventilation, respiratory function stable and patient connected to nasal cannula oxygen Cardiovascular status: blood pressure returned to baseline and stable Postop Assessment: no signs of nausea or vomiting Anesthetic complications: no    Last Vitals:  Filed Vitals:   06/09/15 1022 06/09/15 1030  BP: 103/84   Pulse: 65 65  Temp:    Resp: 26 36    Last Pain:  Filed Vitals:   06/09/15 1031  PainSc: 2                  Zenaida Deed

## 2015-06-10 ENCOUNTER — Encounter (HOSPITAL_COMMUNITY): Payer: Self-pay | Admitting: Neurological Surgery

## 2015-06-10 DIAGNOSIS — M4804 Spinal stenosis, thoracic region: Secondary | ICD-10-CM | POA: Diagnosis not present

## 2015-06-10 LAB — GLUCOSE, CAPILLARY
Glucose-Capillary: 162 mg/dL — ABNORMAL HIGH (ref 65–99)
Glucose-Capillary: 153 mg/dL — ABNORMAL HIGH (ref 65–99)

## 2015-06-10 MED ORDER — METHOCARBAMOL 500 MG PO TABS: 500.0000 mg | ORAL_TABLET | Freq: Four times a day (QID) | ORAL | Status: DC | PRN

## 2015-06-10 MED ORDER — HYDROMORPHONE HCL 2 MG PO TABS: 2.0000 mg | ORAL_TABLET | ORAL | Status: DC | PRN

## 2015-06-10 NOTE — Progress Notes (Signed)
Pt doing well. Pt and family given D/C instructions with Rx's, verbal understanding was provided. Pt's incision is clean and dry with no sign of infection. Pt's IV was removed prior to D/C. Pt D/C'd home via wheelchair @ 1325 per MD order. Pt is stable @ D/C and has no other needs at this time. Holli Humbles, RN

## 2015-06-10 NOTE — Discharge Instructions (Signed)

## 2015-06-10 NOTE — Progress Notes (Addendum)
Inpatient Diabetes Program Recommendations  AACE/ADA: New Consensus Statement on Inpatient Glycemic Control (2015)  Target Ranges:  Prepandial:   less than 140 mg/dL      Peak postprandial:   less than 180 mg/dL (1-2 hours)      Critically ill patients:  140 - 180 mg/dL   Review of Glycemic Control Results for Matthew [name], Matthew [name] (MRN [id number]) as of 06/10/2015 08:58  Ref. Range 06/09/2015 10:23 06/09/2015 13:32 06/09/2015 21:58 06/10/2015 07:53  Glucose-Capillary Latest Ref Range: 65-99 mg/dL 159 (H) 150 (H) 214 (H) 162 (H)  Results for Matthew [name], Matthew [name] (MRN [id number]) as of 06/10/2015 08:58  Ref. Range 06/03/2015 13:43  Hemoglobin A1C Latest Ref Range: 4.8-5.6 % 8.3 (H)   Diabetes history: DM Type 2 Outpatient Meds: No Medications Current orders for Inpatient glycemic control: Novolog correction 0-15 units tid + 0-5 units hs  Inpatient Diabetes Program Recommendations:  Noted patient's A1c was 8.3. Please consider if patient needs to be on diabetes medications on discharge.  1:00 p.m. Spoke with patient and patient states he was on insulin previously ? Years ago and discontinued all diabetes meds after he lost weight and blood glucose was good. Pt. Has appt. To see PCP in week and will discuss diabetes care with PCP. Reviewed basic nutrition plate method with patient.  Thank you, Nani Gasser. Essa Wenk, RN, MSN, CDE Inpatient Glycemic Control Team Team Pager [phone] (8am-5pm) 06/10/2015 9:10 AM

## 2015-06-10 NOTE — Discharge Summary (Signed)
Physician Discharge Summary  Patient ID: Matthew [name] MRN: [id number] DOB/AGE: [birth year] 75 y.o.  Admit date: 06/09/2015 Discharge date: 06/10/2015  Admission Diagnoses: Thoracic stenosis with myelopathy   Discharge Diagnoses: Same   Discharged Condition: good  Hospital Course: The patient was admitted on 06/09/2015 and taken to the operating room where the patient underwent thoracic decompression. The patient tolerated the procedure well and was taken to the recovery room and then to the floor in stable condition. The hospital course was routine. There were no complications. The wound remained clean dry and intact. Pt had appropriate back soreness. No complaints of leg pain or new N/T/W. The patient remained afebrile with stable vital signs, and tolerated a regular diet. The patient continued to increase activities, and pain was well controlled with oral pain medications.   Consults: None  Significant Diagnostic Studies:  Results for orders placed or performed during the hospital encounter of 06/09/15  Glucose, capillary  Result Value Ref Range   Glucose-Capillary 153 (H) 65 - 99 mg/dL  Glucose, capillary  Result Value Ref Range   Glucose-Capillary 159 (H) 65 - 99 mg/dL  Glucose, capillary  Result Value Ref Range   Glucose-Capillary 150 (H) 65 - 99 mg/dL   Comment 1 Notify RN    Comment 2 Document in Chart   Glucose, capillary  Result Value Ref Range   Glucose-Capillary 214 (H) 65 - 99 mg/dL   Comment 1 Notify RN    Comment 2 Document in Chart   Glucose, capillary  Result Value Ref Range   Glucose-Capillary 162 (H) 65 - 99 mg/dL    Chest 2 View  06/03/2015  CLINICAL DATA:  Preop T12-L1 laminectomies for stenosis. EXAM: CHEST  2 VIEW COMPARISON:  01/02/2012. FINDINGS: Trachea is midline. Heart size normal. Lungs are clear. No pleural fluid. Left shoulder arthroplasty. IMPRESSION: No acute findings. Electronically Signed   By: Lorin Picket M.D.   On: 06/03/2015 14:43    Dg Lumbar Spine 2-3 Views  06/09/2015  CLINICAL DATA:  T12-L1 laminectomy EXAM: LUMBAR SPINE - 2 VIEW COMPARISON:  05/19/2015 FINDINGS: Initial lateral view shows a radiopaque needle within the soft tissues posterior to the T12 vertebral body. Postsurgical changes are noted from L3-S1. Subsequent images show surgical retractors at the T12-L1 level with instruments in the posterior aspect of the canal. IMPRESSION: Intraoperative localization at T12-L1. Electronically Signed   By: Inez Catalina M.D.   On: 06/09/2015 11:15    Antibiotics:  Anti-infectives    Start     Dose/Rate Route Frequency Ordered Stop   06/09/15 1700  ceFAZolin (ANCEF) IVPB 1 g/50 mL premix     1 g 100 mL/hr over 30 Minutes Intravenous Every 8 hours 06/09/15 1131 06/10/15 0118   06/09/15 0925  bacitracin 50,000 Units in sodium chloride irrigation 0.9 % 500 mL irrigation  Status:  Discontinued       As needed 06/09/15 0925 06/09/15 1006   06/09/15 0734  ceFAZolin (ANCEF) 2-4 GM/100ML-% IVPB    Comments:  Maryjean Ka   : cabinet override      06/09/15 0734 06/09/15 1944   06/09/15 0732  ceFAZolin (ANCEF) IVPB 2g/100 mL premix     2 g 200 mL/hr over 30 Minutes Intravenous On call to O.R. 06/09/15 0732 06/09/15 0907      Discharge Exam: Blood pressure 120/63, pulse 69, temperature 98.6 F (37 C), temperature source Oral, resp. rate 18, SpO2 98 %. Neurologic: Grossly normal Incision dry  Discharge Medications:  Medication List    TAKE these medications        amLODipine 5 MG tablet  Commonly known as:  NORVASC  Take 5 mg by mouth daily.     aspirin EC 81 MG tablet  Take 81 mg by mouth daily.     diphenhydrAMINE 25 MG tablet  Commonly known as:  BENADRYL  Take 25 mg by mouth daily as needed for itching.     HYDROmorphone 2 MG tablet  Commonly known as:  DILAUDID  Take 1 tablet (2 mg total) by mouth every 4 (four) hours as needed for severe pain.     losartan-hydrochlorothiazide 100-12.5 MG tablet   Commonly known as:  HYZAAR  Take 1 tablet by mouth daily before breakfast.     methocarbamol 500 MG tablet  Commonly known as:  ROBAXIN  Take 1 tablet (500 mg total) by mouth every 6 (six) hours as needed for muscle spasms.     naproxen sodium 220 MG tablet  Commonly known as:  ANAPROX  Take 220 mg by mouth daily as needed (for pain).        Disposition: Home   Final Dx: Thoracic laminectomy for stenosis      Discharge Instructions     Remove dressing in 72 hours    Complete by:  As directed      Call MD for:  difficulty breathing, headache or visual disturbances    Complete by:  As directed      Call MD for:  persistant nausea and vomiting    Complete by:  As directed      Call MD for:  redness, tenderness, or signs of infection (pain, swelling, redness, odor or green/yellow discharge around incision site)    Complete by:  As directed      Call MD for:  severe uncontrolled pain    Complete by:  As directed      Call MD for:  temperature >100.4    Complete by:  As directed      Diet - low sodium heart healthy    Complete by:  As directed      Discharge instructions    Complete by:  As directed   No heavy lifting, no bending or twisting, may shower, no stenosis activity, try not to drive for about a week     Increase activity slowly    Complete by:  As directed               Signed: Ahsley Attwood S 06/10/2015, 12:41 PM

## 2015-11-03 ENCOUNTER — Other Ambulatory Visit: Payer: Self-pay | Admitting: Neurological Surgery

## 2015-11-03 DIAGNOSIS — M5415 Radiculopathy, thoracolumbar region: Secondary | ICD-10-CM

## 2015-11-15 ENCOUNTER — Ambulatory Visit
Admission: RE | Admit: 2015-11-15 | Discharge: 2015-11-15 | Disposition: A | Discharge status: Home / Self Care | Payer: Medicare Other | Source: Ambulatory Visit | Attending: Neurological Surgery | Admitting: Neurological Surgery

## 2015-11-15 DIAGNOSIS — M5415 Radiculopathy, thoracolumbar region: Secondary | ICD-10-CM

## 2015-11-15 MED ORDER — GADOBENATE DIMEGLUMINE 529 MG/ML IV SOLN
19.0000 mL | Freq: Once | INTRAVENOUS | Status: AC | PRN
  Administered 2015-11-15: 19 mL via INTRAVENOUS

## 2016-02-10 DIAGNOSIS — H6991 Unspecified Eustachian tube disorder, right ear: Secondary | ICD-10-CM | POA: Insufficient documentation

## 2016-05-10 ENCOUNTER — Other Ambulatory Visit: Payer: Self-pay | Admitting: Gastroenterology

## 2016-06-01 ENCOUNTER — Encounter (HOSPITAL_COMMUNITY): Payer: Self-pay | Admitting: *Deleted

## 2016-06-05 ENCOUNTER — Encounter (HOSPITAL_COMMUNITY): Payer: Self-pay

## 2016-06-05 ENCOUNTER — Ambulatory Visit (HOSPITAL_COMMUNITY)
Admission: RE | Admit: 2016-06-05 | Discharge: 2016-06-05 | Disposition: A | Discharge status: Home / Self Care | Payer: Medicare Other | Source: Ambulatory Visit | Attending: Gastroenterology | Admitting: Gastroenterology

## 2016-06-05 ENCOUNTER — Ambulatory Visit (HOSPITAL_COMMUNITY): Payer: Medicare Other | Admitting: Certified Registered Nurse Anesthetist

## 2016-06-05 ENCOUNTER — Encounter (HOSPITAL_COMMUNITY)
Admission: RE | Disposition: A | Discharge status: Home / Self Care | Payer: Self-pay | Source: Ambulatory Visit | Attending: Gastroenterology

## 2016-06-05 DIAGNOSIS — Z96653 Presence of artificial knee joint, bilateral: Secondary | ICD-10-CM | POA: Insufficient documentation

## 2016-06-05 DIAGNOSIS — E119 Type 2 diabetes mellitus without complications: Secondary | ICD-10-CM | POA: Diagnosis not present

## 2016-06-05 DIAGNOSIS — Z1211 Encounter for screening for malignant neoplasm of colon: Secondary | ICD-10-CM | POA: Insufficient documentation

## 2016-06-05 DIAGNOSIS — K5730 Diverticulosis of large intestine without perforation or abscess without bleeding: Secondary | ICD-10-CM | POA: Insufficient documentation

## 2016-06-05 DIAGNOSIS — D125 Benign neoplasm of sigmoid colon: Secondary | ICD-10-CM | POA: Insufficient documentation

## 2016-06-05 DIAGNOSIS — I10 Essential (primary) hypertension: Secondary | ICD-10-CM | POA: Diagnosis not present

## 2016-06-05 HISTORY — PX: COLONOSCOPY WITH PROPOFOL: SHX5780

## 2016-06-05 HISTORY — DX: Personal history of urinary calculi: Z87.442

## 2016-06-05 SURGERY — COLONOSCOPY WITH PROPOFOL
Anesthesia: Monitor Anesthesia Care

## 2016-06-05 MED ORDER — PROPOFOL 10 MG/ML IV BOLUS
INTRAVENOUS | Status: DC | PRN
  Administered 2016-06-05: 10 mg via INTRAVENOUS
  Administered 2016-06-05: 50 mg via INTRAVENOUS

## 2016-06-05 MED ORDER — PROPOFOL 10 MG/ML IV BOLUS
INTRAVENOUS | Status: AC
  Filled 2016-06-05: qty 40

## 2016-06-05 MED ORDER — LACTATED RINGERS IV SOLN
INTRAVENOUS | Status: DC
  Administered 2016-06-05: 12:00:00 via INTRAVENOUS
  Administered 2016-06-05: 1000 mL via INTRAVENOUS

## 2016-06-05 MED ORDER — SODIUM CHLORIDE 0.9 % IV SOLN: INTRAVENOUS | Status: DC

## 2016-06-05 MED ORDER — PROPOFOL 500 MG/50ML IV EMUL
INTRAVENOUS | Status: DC | PRN
  Administered 2016-06-05: 150 ug/kg/min via INTRAVENOUS

## 2016-06-05 MED ORDER — GLYCOPYRROLATE 0.2 MG/ML IV SOSY
PREFILLED_SYRINGE | INTRAVENOUS | Status: DC | PRN
  Administered 2016-06-05: .1 mg via INTRAVENOUS

## 2016-06-05 SURGICAL SUPPLY — 21 items

## 2016-06-05 NOTE — Transfer of Care (Signed)
Immediate Anesthesia Transfer of Care Note  Patient: Matthew [name]  Procedure(s) Performed: Procedure(s): COLONOSCOPY WITH PROPOFOL (N/A)  Patient Location: PACU  Anesthesia Type:MAC  Level of Consciousness: Patient easily awoken, sedated, comfortable, cooperative, following commands, responds to stimulation.   Airway & Oxygen Therapy: Patient spontaneously breathing, ventilating well, oxygen via simple oxygen mask.  Post-op Assessment: Report given to PACU RN, vital signs reviewed and stable, moving all extremities.   Post vital signs: Reviewed and stable.  Complications: No apparent anesthesia complications Last Vitals:  Vitals:   06/05/16 1042  BP: 135/71  Pulse: 64  Resp: 16  Temp: 36.6 C    Last Pain:  Vitals:   06/05/16 1042  TempSrc: Oral         Complications: No apparent anesthesia complications

## 2016-06-05 NOTE — H&P (Signed)
Procedure: Screening colonoscopy. Normal screening colonoscopy was performed on 04/18/2006  History: The patient is a 76 year old male born 1940-08-10. He is scheduled to undergo a repeat screening colonoscopy today.  Past medical history: Type 2 diabetes mellitus. Osteoarthritis. Hypercholesterolemia. Colonic diverticulosis. Hypertension. Kidney stones. Lumbar spinal stenosis. Arthroscopic knee surgery. Left shoulder surgery. Right thumb surgery. Bilateral knee replacement surgeries. Lumbar spinal stenosis surgery.  Medication allergies: Statin drugs cause muscle aches. Morphine sulfate. Codeine. Lisinopril causes cough.  Exam: The patient is alert and lying comfortably on the endoscopy stretcher. Abdomen is soft and nontender to palpation. Lungs are clear to auscultation. Cardiac exam reveals a regular rhythm.  Plan: Proceed with screening colonoscopy

## 2016-06-05 NOTE — Anesthesia Preprocedure Evaluation (Addendum)
Anesthesia Evaluation  Patient identified by MRN, date of birth, ID band Patient awake    Reviewed: Allergy & Precautions, H&P , NPO status , Patient's Chart, lab work & pertinent test results  Airway Mallampati: I  TM Distance: >3 FB Neck ROM: Full    Dental no notable dental hx. (+) Teeth Intact   Pulmonary    Pulmonary exam normal        Cardiovascular hypertension, Pt. on medications  Rhythm:Regular Rate:Normal     Neuro/Psych    GI/Hepatic Neg liver ROS,   Endo/Other  diabetes  Renal/GU Renal InsufficiencyRenal disease     Musculoskeletal   Abdominal Normal abdominal exam  (+)   Peds  Hematology   Anesthesia Other Findings   Reproductive/Obstetrics                             Anesthesia Physical  Anesthesia Plan  ASA: II  Anesthesia Plan: MAC   Post-op Pain Management:    Induction: Intravenous  Airway Management Planned: Natural Airway and Simple Face Mask  Additional Equipment:   Intra-op Plan:   Post-operative Plan:   Informed Consent: I have reviewed the patients History and Physical, chart, labs and discussed the procedure including the risks, benefits and alternatives for the proposed anesthesia with the patient or authorized representative who has indicated his/her understanding and acceptance.     Plan Discussed with: CRNA and Surgeon  Anesthesia Plan Comments:         Anesthesia Quick Evaluation

## 2016-06-05 NOTE — Anesthesia Postprocedure Evaluation (Addendum)
Anesthesia Post Note  Patient: Matthew [name]  Procedure(s) Performed: Procedure(s) (LRB): COLONOSCOPY WITH PROPOFOL (N/A)  Patient location during evaluation: PACU Anesthesia Type: MAC Level of consciousness: awake and sedated Pain management: pain level controlled Vital Signs Assessment: post-procedure vital signs reviewed and stable Respiratory status: spontaneous breathing Cardiovascular status: stable Postop Assessment: no signs of nausea or vomiting Anesthetic complications: no        Last Vitals:  Vitals:   06/05/16 1042 06/05/16 1212  BP: 135/71 (!) 100/52  Pulse: 64 (!) 59  Resp: 16 16  Temp: 36.6 C 36.4 C    Last Pain:  Vitals:   06/05/16 1212  TempSrc: Oral   Pain Goal:                 Britanni Yarde JR,JOHN Kassim Guertin

## 2016-06-05 NOTE — Discharge Instructions (Signed)

## 2016-06-05 NOTE — Op Note (Signed)
[facility] Patient Name: Matthew [name] Procedure Date: 06/05/2016 MRN: [id number] Attending MD: Garlan Fair , MD Date of Birth: Jan 13, 1941 CSN: [id number] Age: 76 Admit Type: Outpatient Procedure:                Colonoscopy Indications:              Screening for colorectal malignant neoplasm Providers:                Garlan Fair, MD, Carolynn Comment, RN, Ralene Bathe, Technician, Heide Scales, CRNA Referring MD:              Medicines:                Propofol per Anesthesia Complications:            No immediate complications. Estimated Blood Loss:     Estimated blood loss was minimal. Procedure:                Pre-Anesthesia Assessment:                           - Prior to the procedure, a History and Physical                            was performed, and patient medications and                            allergies were reviewed. The patient's tolerance of                            previous anesthesia was also reviewed. The risks                            and benefits of the procedure and the sedation                            options and risks were discussed with the patient.                            All questions were answered, and informed consent                            was obtained. Prior Anticoagulants: The patient has                            taken aspirin, last dose was 1 day prior to                            procedure. ASA Grade Assessment: II - A patient                            with mild systemic disease. After reviewing the  risks and benefits, the patient was deemed in                            satisfactory condition to undergo the procedure.                           After obtaining informed consent, the colonoscope                            was passed under direct vision. Throughout the                            procedure, the patient's blood pressure, pulse, and                       oxygen saturations were monitored continuously. The                            EC-[id number] ([id number]) scope was introduced through                            the anus and advanced to the the cecum, identified                            by appendiceal orifice and ileocecal valve. The                            colonoscopy was performed without difficulty. The                            patient tolerated the procedure well. The quality                            of the bowel preparation was good. The terminal                            ileum, the ileocecal valve, the appendiceal orifice                            and the rectum were photographed. Scope In: 11:43:45 AM Scope Out: 12:06:02 PM Scope Withdrawal Time: 0 hours 15 minutes 44 seconds  Total Procedure Duration: 0 hours 22 minutes 17 seconds  Findings:      The perianal and digital rectal examinations were normal.      A 5 mm polyp was found in the proximal sigmoid colon. The polyp was       pedunculated. The polyp was removed with a cold snare. Resection and       retrieval were complete. An endoclip was applied to the polypectomy site.      The exam was otherwise without abnormality. Extensive left colonic       diverticulosis was present. Impression:               - One 5 mm polyp in the proximal sigmoid colon,  removed with a cold snare. Resected and retrieved.                           - The examination was otherwise normal. Moderate Sedation:      N/A- Per Anesthesia Care Recommendation:           - Patient has a contact number available for                            emergencies. The signs and symptoms of potential                            delayed complications were discussed with the                            patient. Return to normal activities tomorrow.                            Written discharge instructions were provided to the                            patient.                            - Repeat colonoscopy date to be determined after                            pending pathology results are reviewed for                            surveillance.                           - Resume previous diet.                           - Continue present medications. Procedure Code(s):        --- Professional ---                           [phone], Colonoscopy, flexible; with removal of                            tumor(s), polyp(s), or other lesion(s) by snare                            technique Diagnosis Code(s):        --- Professional ---                           Z12.11, Encounter for screening for malignant                            neoplasm of colon                           D12.5, Benign neoplasm of sigmoid colon  CPT copyright 2016 [employer]. All rights reserved. The codes documented in this report are preliminary and upon coder review may  be revised to meet current compliance requirements. Earle Gell, MD Garlan Fair, MD 06/05/2016 12:12:45 PM This report has been signed electronically. Number of Addenda: 0

## 2016-06-07 ENCOUNTER — Encounter (HOSPITAL_COMMUNITY): Payer: Self-pay | Admitting: Gastroenterology

## 2016-07-14 NOTE — Addendum Note (Signed)
Addendum  created 07/14/16 1211 by Lyn Hollingshead, MD   Sign clinical note

## 2016-10-25 ENCOUNTER — Other Ambulatory Visit: Payer: Self-pay | Admitting: Neurological Surgery

## 2016-10-25 DIAGNOSIS — M5415 Radiculopathy, thoracolumbar region: Secondary | ICD-10-CM

## 2016-10-31 ENCOUNTER — Ambulatory Visit
Admission: RE | Admit: 2016-10-31 | Discharge: 2016-10-31 | Disposition: A | Discharge status: Home / Self Care | Payer: Medicare Other | Source: Ambulatory Visit | Attending: Neurological Surgery | Admitting: Neurological Surgery

## 2016-10-31 VITALS — BP 139/82 | HR 60

## 2016-10-31 DIAGNOSIS — M5415 Radiculopathy, thoracolumbar region: Secondary | ICD-10-CM

## 2016-10-31 DIAGNOSIS — G959 Disease of spinal cord, unspecified: Secondary | ICD-10-CM

## 2016-10-31 MED ORDER — IOPAMIDOL (ISOVUE-M 300) INJECTION 61%
10.0000 mL | Freq: Once | INTRAMUSCULAR | Status: AC | PRN
  Administered 2016-10-31: 10 mL via INTRATHECAL

## 2016-10-31 MED ORDER — DIAZEPAM 5 MG PO TABS
5.0000 mg | ORAL_TABLET | Freq: Once | ORAL | Status: AC
  Administered 2016-10-31: 5 mg via ORAL

## 2016-10-31 NOTE — Discharge Instructions (Signed)

## 2016-12-23 ENCOUNTER — Emergency Department (HOSPITAL_COMMUNITY): Payer: Medicare Other

## 2016-12-23 ENCOUNTER — Encounter (HOSPITAL_COMMUNITY): Payer: Self-pay | Admitting: Emergency Medicine

## 2016-12-23 ENCOUNTER — Inpatient Hospital Stay (HOSPITAL_COMMUNITY)
Admission: EM | Admit: 2016-12-23 | Discharge: 2016-12-27 | DRG: 246 | Disposition: A | Discharge status: Home / Self Care | Payer: Medicare Other | Attending: Internal Medicine | Admitting: Internal Medicine

## 2016-12-23 ENCOUNTER — Other Ambulatory Visit: Payer: Self-pay

## 2016-12-23 DIAGNOSIS — E785 Hyperlipidemia, unspecified: Secondary | ICD-10-CM | POA: Diagnosis present

## 2016-12-23 DIAGNOSIS — I10 Essential (primary) hypertension: Secondary | ICD-10-CM | POA: Diagnosis present

## 2016-12-23 DIAGNOSIS — E1159 Type 2 diabetes mellitus with other circulatory complications: Secondary | ICD-10-CM

## 2016-12-23 DIAGNOSIS — M1990 Unspecified osteoarthritis, unspecified site: Secondary | ICD-10-CM | POA: Diagnosis present

## 2016-12-23 DIAGNOSIS — N183 Chronic kidney disease, stage 3 (moderate): Secondary | ICD-10-CM | POA: Diagnosis present

## 2016-12-23 DIAGNOSIS — Z885 Allergy status to narcotic agent status: Secondary | ICD-10-CM

## 2016-12-23 DIAGNOSIS — I2510 Atherosclerotic heart disease of native coronary artery without angina pectoris: Secondary | ICD-10-CM | POA: Diagnosis present

## 2016-12-23 DIAGNOSIS — Z794 Long term (current) use of insulin: Secondary | ICD-10-CM

## 2016-12-23 DIAGNOSIS — I249 Acute ischemic heart disease, unspecified: Secondary | ICD-10-CM

## 2016-12-23 DIAGNOSIS — I214 Non-ST elevation (NSTEMI) myocardial infarction: Secondary | ICD-10-CM | POA: Diagnosis not present

## 2016-12-23 DIAGNOSIS — R079 Chest pain, unspecified: Secondary | ICD-10-CM | POA: Diagnosis present

## 2016-12-23 DIAGNOSIS — Z955 Presence of coronary angioplasty implant and graft: Secondary | ICD-10-CM

## 2016-12-23 DIAGNOSIS — R0789 Other chest pain: Secondary | ICD-10-CM

## 2016-12-23 DIAGNOSIS — I129 Hypertensive chronic kidney disease with stage 1 through stage 4 chronic kidney disease, or unspecified chronic kidney disease: Secondary | ICD-10-CM | POA: Diagnosis present

## 2016-12-23 DIAGNOSIS — Z8249 Family history of ischemic heart disease and other diseases of the circulatory system: Secondary | ICD-10-CM

## 2016-12-23 DIAGNOSIS — Z87442 Personal history of urinary calculi: Secondary | ICD-10-CM

## 2016-12-23 DIAGNOSIS — I252 Old myocardial infarction: Secondary | ICD-10-CM

## 2016-12-23 DIAGNOSIS — I4510 Unspecified right bundle-branch block: Secondary | ICD-10-CM | POA: Diagnosis present

## 2016-12-23 DIAGNOSIS — Z79899 Other long term (current) drug therapy: Secondary | ICD-10-CM

## 2016-12-23 DIAGNOSIS — E876 Hypokalemia: Secondary | ICD-10-CM | POA: Diagnosis present

## 2016-12-23 DIAGNOSIS — Z96652 Presence of left artificial knee joint: Secondary | ICD-10-CM | POA: Diagnosis present

## 2016-12-23 DIAGNOSIS — Z96612 Presence of left artificial shoulder joint: Secondary | ICD-10-CM | POA: Diagnosis present

## 2016-12-23 DIAGNOSIS — E119 Type 2 diabetes mellitus without complications: Secondary | ICD-10-CM

## 2016-12-23 DIAGNOSIS — N182 Chronic kidney disease, stage 2 (mild): Secondary | ICD-10-CM

## 2016-12-23 DIAGNOSIS — E1122 Type 2 diabetes mellitus with diabetic chronic kidney disease: Secondary | ICD-10-CM

## 2016-12-23 DIAGNOSIS — N179 Acute kidney failure, unspecified: Secondary | ICD-10-CM | POA: Diagnosis present

## 2016-12-23 LAB — BASIC METABOLIC PANEL
Anion gap: 12 (ref 5–15)
BUN: 31 mg/dL — ABNORMAL HIGH (ref 6–20)
CO2: 24 mmol/L (ref 22–32)
Calcium: 9.4 mg/dL (ref 8.9–10.3)
Chloride: 100 mmol/L — ABNORMAL LOW (ref 101–111)
Creatinine, Ser: 1.44 mg/dL — ABNORMAL HIGH (ref 0.61–1.24)
GFR calc Af Amer: 54 mL/min — ABNORMAL LOW (ref >60)
GFR calc non Af Amer: 46 mL/min — ABNORMAL LOW (ref >60)
Glucose, Bld: 288 mg/dL — ABNORMAL HIGH (ref 65–99)
Potassium: 4 mmol/L (ref 3.5–5.1)
Sodium: 136 mmol/L (ref 135–145)

## 2016-12-23 LAB — CBC
HCT: 44 % (ref 39.0–52.0)
Hemoglobin: 15.6 g/dL (ref 13.0–17.0)
MCH: 31.7 pg (ref 26.0–34.0)
MCHC: 35.5 g/dL (ref 30.0–36.0)
MCV: 89.4 fL (ref 78.0–100.0)
Platelets: 237 10*3/uL (ref 150–400)
RBC: 4.92 MIL/uL (ref 4.22–5.81)
RDW: 12.5 % (ref 11.5–15.5)
WBC: 7.8 10*3/uL (ref 4.0–10.5)

## 2016-12-23 LAB — I-STAT TROPONIN, ED: Troponin i, poc: 0 ng/mL (ref 0.00–0.08)

## 2016-12-23 MED ORDER — NITROGLYCERIN 2 % TD OINT
1.0000 [in_us] | TOPICAL_OINTMENT | Freq: Once | TRANSDERMAL | Status: AC
  Administered 2016-12-23: 1 [in_us] via TOPICAL
  Filled 2016-12-23: qty 1

## 2016-12-23 MED ORDER — ASPIRIN 81 MG PO CHEW
324.0000 mg | CHEWABLE_TABLET | Freq: Once | ORAL | Status: AC
Start: 2016-12-23 — End: 2016-12-23
  Administered 2016-12-23: 324 mg via ORAL
  Filled 2016-12-23: qty 4

## 2016-12-23 NOTE — ED Triage Notes (Signed)
Patient is complaining of chest pain and right arm numbness. Patient states it started around an hour ago. Patient states the pain is in the middle of his chest. Patient not complaining of any other symptoms.

## 2016-12-23 NOTE — ED Notes (Signed)
No respiratory or acute distress noted alert and oriented x 3 family at bedside call light in reach. 

## 2016-12-23 NOTE — ED Provider Notes (Signed)
[facility] DEPT Provider Note   CSN: [id number] Arrival date & time: 12/23/16  2159     History   Chief Complaint Chief Complaint  Patient presents with  . Chest Pain    HPI Kaden Daughdrill is a 76 y.o. male.  HPI  This is a 76 year old male with a history of diabetes, hypertension, hyperlipidemia who presents with chest pain.  Onset of chest pain at 9:30 PM.  Patient reports pressure-like pain that radiates some into his right arm.  He states that his right arm feels like it has a blood pressure cuff around it.  Currently his pain is 4 out of 10.  He states that the pain kind of "took his breath away."  Denies any recent fevers, cough, nausea, vomiting.  No known history of heart disease.  Patient states that he was walking to his car when he had this pain.  He had one episode of similar pain 3 weeks ago that lasted for 20 minutes.  Past Medical History:  Diagnosis Date  . Arthritis    Shoulder, knees, back  . 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 Cataract    right  . Chronic kidney disease    Stage II, per Dr. 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 Complication of anesthesia    "Too much with shoulder surgery", pt. reports that he was told the at they "lost him, due to absorbing too much anesthesia". Not enough for  Right ,  Left knee  just right.  . Constipation    occ  . Diabetes mellitus    type 2 diet controlled  . Dysrhythmia    has been irregular at times since age 69.  Marland Kitchen History of kidney stones   . Hypertension    treated/managed by Dr. Lysle Rubens, [facility]   . Kidney stones 2011   multiple times.    Patient Active Problem List   Diagnosis Date Noted  . 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 COLONOSCOPY WITH PROPOFOL N/A 06/05/2016   Performed by Garlan Fair, MD at [place]  . EYE SURGERY     Cataract Left eye  . fisture      x 2 anal fissure  . HAND SURGERY     x2 for  ligament rebuilt  . KNEE SURGERY  2009   Right- first arthroscopy, then partial replacement- [facility]   . Laminectomy - T12-L1 N/A 06/09/2015   Performed by Eustace Moore, MD at [facility] NEURO ORS  . LEFT TOTAL SHOULDER ARTHROPLASTY VERSES A REVERSE TOTAL SHOULDER ARTHROPLASTY Left 12/06/2012   Performed by Augustin Schooling, MD at [facility] OR  . LITHOTRIPSY    . MED[facility]  [facility]   . POSTERIOR LUMBAR FUSION  02/02/2012  . POSTERIOR LUMBAR FUSION 2 LEVEL N/A 02/02/2012   Performed by Eustace Moore, MD at [facility] NEURO ORS  . REVERSE SHOULDER ARTHROPLASTY Left 12/06/2012   Dr Veverly Fells  . SHOULDER SURGERY  1985   L arthroscopic        Home Medications    Prior to Admission medications   Medication Sig Start Date End Date Taking? Authorizing Provider  amLODipine (NORVASC) 5 MG tablet Take 5 mg by mouth daily.   Yes [provider]  losartan-hydrochlorothiazide (HYZAAR) 100-12.5 MG per tablet Take 1 tablet by mouth daily  before breakfast.    Yes [provider]  naproxen sodium (ALEVE) 220 MG tablet Take 440 mg by mouth daily as needed (pain).   Yes [provider]  Omega-3 Fatty Acids (FISH OIL) 1000 MG CAPS Take 1,000 mg by mouth daily.   Yes [provider]  RESTASIS 0.05 % ophthalmic emulsion Place 1 drop 2 (two) times daily into both eyes. 10/25/16  Yes [provider]  TRESIBA FLEXTOUCH 100 UNIT/ML SOPN FlexTouch Pen Inject 20 Units daily after breakfast into the skin. 11/23/16  Yes [provider]    Family History History reviewed. No pertinent family history.  Social History Social History   Tobacco Use  . Smoking status: Never Smoker  . Smokeless tobacco: Never Used  Substance Use Topics  . Alcohol use: No  . Drug use: No     Allergies   Oxycodone; Codeine; and Morphine and related   Review of Systems Review of Systems    Constitutional: Negative for fever.  Respiratory: Negative for shortness of breath.   Cardiovascular: Positive for chest pain. Negative for leg swelling.  Gastrointestinal: Negative for abdominal pain, nausea and vomiting.  Musculoskeletal: Negative for back pain.  All other systems reviewed and are negative.    Physical Exam Updated Vital Signs BP (!) 145/86   Pulse 89   Resp 12   Ht 5\' 9"  (1.753 m)   Wt 80.3 kg (177 lb)   SpO2 98%   BMI 26.14 kg/m   Physical Exam  Constitutional: He is oriented to person, place, and time. He appears well-developed and well-nourished.  Overweight  HENT:  Head: Normocephalic and atraumatic.  Eyes: Pupils are equal, round, and reactive to light.  Neck: Neck supple.  Cardiovascular: Normal rate, regular rhythm, normal heart sounds and normal pulses.  No murmur heard. Pulmonary/Chest: Effort normal and breath sounds normal. No respiratory distress. He has no wheezes.  Abdominal: Soft. Bowel sounds are normal. There is no tenderness. There is no rebound.  Musculoskeletal: He exhibits no edema.  Lymphadenopathy:    He has no cervical adenopathy.  Neurological: He is alert and oriented to person, place, and time.  Skin: Skin is warm and dry.  Psychiatric: He has a normal mood and affect.  Nursing note and vitals reviewed.    ED Treatments / Results  Labs (all labs ordered are listed, but only abnormal results are displayed) Labs Reviewed  BASIC METABOLIC PANEL - Abnormal; Notable for the following components:      Result Value   Chloride 100 (*)    Glucose, Bld 288 (*)    BUN 31 (*)    Creatinine, Ser 1.44 (*)    GFR calc non Af Amer 46 (*)    GFR calc Af Amer 54 (*)    All other components within normal limits  CBC  I-STAT TROPONIN, ED    EKG  EKG Interpretation  Date/Time:  Saturday December 23 2016 22:00:45 EST Ventricular Rate:  98 PR Interval:    QRS Duration: 149 QT Interval:  391 QTC Calculation: 500 R  Axis:   27 Text Interpretation:  Sinus rhythm Probable left atrial enlargement Right bundle branch block No significant change since last tracing Confirmed by Theotis Burrow [phone]) on 12/23/2016 10:31:40 PM       Radiology Dg Chest 2 View  Result Date: 12/23/2016 CLINICAL DATA:  76 year old male with chest pain and shortness of breath. EXAM: CHEST  2 VIEW COMPARISON:  Chest radiograph dated 06/03/2015 FINDINGS: The lungs  are clear. There is no pleural effusion or pneumothorax. The cardiac silhouette is within normal limits. There is atherosclerotic calcification of the aortic arch. There is degenerative changes of the spine. There is a left shoulder arthroplasty. No acute osseous pathology. IMPRESSION: No active cardiopulmonary disease. Electronically Signed   By: Anner Crete M.D.   On: 12/23/2016 22:19    Procedures Procedures (including critical care time)  CRITICAL CARE Performed by: Merryl Hacker   Total critical care time: 30 minutes  Critical care time was exclusive of separately billable procedures and treating other patients.  Critical care was necessary to treat or prevent imminent or life-threatening deterioration.  Critical care was time spent personally by me on the following activities: development of treatment plan with patient and/or surrogate as well as nursing, discussions with consultants, evaluation of patient's response to treatment, examination of patient, obtaining history from patient or surrogate, ordering and performing treatments and interventions, ordering and review of laboratory studies, ordering and review of radiographic studies, pulse oximetry and re-evaluation of patient's condition.   Medications Ordered in ED Medications  nitroGLYCERIN 50 mg in dextrose 5 % 250 mL (0.2 mg/mL) infusion (not administered)  aspirin chewable tablet 324 mg (324 mg Oral Given 12/23/16 2356)  nitroGLYCERIN (NITROGLYN) 2 % ointment 1 inch (1 inch Topical Given  12/23/16 2356)     Initial Impression / Assessment and Plan / ED Course  I have reviewed the triage vital signs and the nursing notes.  Pertinent labs & imaging results that were available during my care of the patient were reviewed by me and considered in my medical decision making (see chart for details).     Patient presents with chest pain.  He is overall nontoxic-appearing.  Vital signs are reassuring.  EKG is not significantly changed from prior.  Onset of pain was 9:30 PM while walking to the car.  Has had an episode previously that was similar.  His history is concerning for ACS.  He also has multiple risk factors.  Initial workup including troponin is reassuring.  However, this would not effectively rule out ACS.  Patient was given aspirin and nitro ointment was placed.  With nitro ointment in place, pain improved from a 4 to a 1.  Will start a nitroglycerin drip and heparin drip.  Plan for admission for formal chest pain rule out and likely definitive cardiac evaluation.    Final Clinical Impressions(s) / ED Diagnoses   Final diagnoses:  ACS (acute coronary syndrome) [facility])  Other chest pain    ED Discharge Orders    None       Hailea Eaglin, Barbette Hair, MD 12/24/16 [id number]

## 2016-12-24 ENCOUNTER — Other Ambulatory Visit (HOSPITAL_COMMUNITY): Payer: Medicare Other

## 2016-12-24 DIAGNOSIS — E1122 Type 2 diabetes mellitus with diabetic chronic kidney disease: Secondary | ICD-10-CM

## 2016-12-24 DIAGNOSIS — I4510 Unspecified right bundle-branch block: Secondary | ICD-10-CM | POA: Diagnosis present

## 2016-12-24 DIAGNOSIS — I10 Essential (primary) hypertension: Secondary | ICD-10-CM | POA: Diagnosis not present

## 2016-12-24 DIAGNOSIS — E1159 Type 2 diabetes mellitus with other circulatory complications: Secondary | ICD-10-CM

## 2016-12-24 DIAGNOSIS — Z87442 Personal history of urinary calculi: Secondary | ICD-10-CM | POA: Diagnosis not present

## 2016-12-24 DIAGNOSIS — I249 Acute ischemic heart disease, unspecified: Secondary | ICD-10-CM | POA: Insufficient documentation

## 2016-12-24 DIAGNOSIS — Z794 Long term (current) use of insulin: Secondary | ICD-10-CM | POA: Diagnosis not present

## 2016-12-24 DIAGNOSIS — R079 Chest pain, unspecified: Secondary | ICD-10-CM | POA: Diagnosis not present

## 2016-12-24 DIAGNOSIS — E876 Hypokalemia: Secondary | ICD-10-CM | POA: Diagnosis present

## 2016-12-24 DIAGNOSIS — I2510 Atherosclerotic heart disease of native coronary artery without angina pectoris: Secondary | ICD-10-CM | POA: Diagnosis present

## 2016-12-24 DIAGNOSIS — I214 Non-ST elevation (NSTEMI) myocardial infarction: Principal | ICD-10-CM

## 2016-12-24 DIAGNOSIS — Z955 Presence of coronary angioplasty implant and graft: Secondary | ICD-10-CM | POA: Diagnosis not present

## 2016-12-24 DIAGNOSIS — E119 Type 2 diabetes mellitus without complications: Secondary | ICD-10-CM

## 2016-12-24 DIAGNOSIS — N183 Chronic kidney disease, stage 3 (moderate): Secondary | ICD-10-CM | POA: Diagnosis present

## 2016-12-24 DIAGNOSIS — I361 Nonrheumatic tricuspid (valve) insufficiency: Secondary | ICD-10-CM | POA: Diagnosis not present

## 2016-12-24 DIAGNOSIS — Z885 Allergy status to narcotic agent status: Secondary | ICD-10-CM | POA: Diagnosis not present

## 2016-12-24 DIAGNOSIS — M1990 Unspecified osteoarthritis, unspecified site: Secondary | ICD-10-CM | POA: Diagnosis present

## 2016-12-24 DIAGNOSIS — Z96612 Presence of left artificial shoulder joint: Secondary | ICD-10-CM | POA: Diagnosis present

## 2016-12-24 DIAGNOSIS — Z8249 Family history of ischemic heart disease and other diseases of the circulatory system: Secondary | ICD-10-CM | POA: Diagnosis not present

## 2016-12-24 DIAGNOSIS — I252 Old myocardial infarction: Secondary | ICD-10-CM

## 2016-12-24 DIAGNOSIS — N179 Acute kidney failure, unspecified: Secondary | ICD-10-CM | POA: Diagnosis present

## 2016-12-24 DIAGNOSIS — I129 Hypertensive chronic kidney disease with stage 1 through stage 4 chronic kidney disease, or unspecified chronic kidney disease: Secondary | ICD-10-CM | POA: Diagnosis present

## 2016-12-24 DIAGNOSIS — N182 Chronic kidney disease, stage 2 (mild): Secondary | ICD-10-CM

## 2016-12-24 DIAGNOSIS — E785 Hyperlipidemia, unspecified: Secondary | ICD-10-CM | POA: Diagnosis present

## 2016-12-24 DIAGNOSIS — R0789 Other chest pain: Secondary | ICD-10-CM | POA: Diagnosis present

## 2016-12-24 DIAGNOSIS — Z96652 Presence of left artificial knee joint: Secondary | ICD-10-CM | POA: Diagnosis present

## 2016-12-24 DIAGNOSIS — Z79899 Other long term (current) drug therapy: Secondary | ICD-10-CM | POA: Diagnosis not present

## 2016-12-24 LAB — LIPID PANEL
Cholesterol: 163 mg/dL (ref 0–200)
HDL: 37 mg/dL — ABNORMAL LOW (ref >40)
LDL Cholesterol: 113 mg/dL — ABNORMAL HIGH (ref 0–99)
Total CHOL/HDL Ratio: 4.4 RATIO
Triglycerides: 63 mg/dL (ref <150)
VLDL: 13 mg/dL (ref 0–40)

## 2016-12-24 LAB — TROPONIN I
Troponin I: 0.27 ng/mL (ref <0.03)
Troponin I: 1.76 ng/mL (ref <0.03)

## 2016-12-24 LAB — GLUCOSE, CAPILLARY
Glucose-Capillary: 140 mg/dL — ABNORMAL HIGH (ref 65–99)
Glucose-Capillary: 245 mg/dL — ABNORMAL HIGH (ref 65–99)
Glucose-Capillary: 122 mg/dL — ABNORMAL HIGH (ref 65–99)
Glucose-Capillary: 139 mg/dL — ABNORMAL HIGH (ref 65–99)
Glucose-Capillary: 225 mg/dL — ABNORMAL HIGH (ref 65–99)

## 2016-12-24 LAB — HEPARIN LEVEL (UNFRACTIONATED)
Heparin Unfractionated: 0.38 IU/mL (ref 0.30–0.70)
Heparin Unfractionated: 0.42 IU/mL (ref 0.30–0.70)

## 2016-12-24 LAB — PROTIME-INR
INR: 0.95
Prothrombin Time: 12.6 seconds (ref 11.4–15.2)

## 2016-12-24 LAB — CBG MONITORING, ED: Glucose-Capillary: 96 mg/dL (ref 65–99)

## 2016-12-24 LAB — APTT: aPTT: 28 seconds (ref 24–36)

## 2016-12-24 MED ORDER — NITROGLYCERIN 2 % TD OINT
1.0000 [in_us] | TOPICAL_OINTMENT | Freq: Four times a day (QID) | TRANSDERMAL | Status: DC
  Administered 2016-12-24 – 2016-12-25 (×4): 1 [in_us] via TOPICAL
  Filled 2016-12-24: qty 1
  Filled 2016-12-24: qty 30

## 2016-12-24 MED ORDER — SODIUM CHLORIDE 0.9% FLUSH: 3.0000 mL | INTRAVENOUS | Status: DC | PRN

## 2016-12-24 MED ORDER — METOPROLOL SUCCINATE ER 25 MG PO TB24
12.5000 mg | ORAL_TABLET | Freq: Every day | ORAL | Status: DC
  Administered 2016-12-24 – 2016-12-27 (×4): 12.5 mg via ORAL
  Filled 2016-12-24 (×5): qty 1

## 2016-12-24 MED ORDER — ONDANSETRON HCL 4 MG/2ML IJ SOLN: 4.0000 mg | Freq: Four times a day (QID) | INTRAMUSCULAR | Status: DC | PRN

## 2016-12-24 MED ORDER — HEPARIN (PORCINE) IN NACL 100-0.45 UNIT/ML-% IJ SOLN
1000.0000 [IU]/h | INTRAMUSCULAR | Status: DC
  Administered 2016-12-24 (×2): 1000 [IU]/h via INTRAVENOUS
  Filled 2016-12-24 (×2): qty 250

## 2016-12-24 MED ORDER — ATORVASTATIN CALCIUM 80 MG PO TABS
80.0000 mg | ORAL_TABLET | Freq: Every day | ORAL | Status: DC
  Administered 2016-12-25: 80 mg via ORAL
  Filled 2016-12-24 (×2): qty 1

## 2016-12-24 MED ORDER — SODIUM CHLORIDE 0.9% FLUSH
3.0000 mL | Freq: Two times a day (BID) | INTRAVENOUS | Status: DC
  Administered 2016-12-25: 3 mL via INTRAVENOUS

## 2016-12-24 MED ORDER — SODIUM CHLORIDE 0.9 % WEIGHT BASED INFUSION: 3.0000 mL/kg/h | INTRAVENOUS | Status: DC

## 2016-12-24 MED ORDER — NITROGLYCERIN IN D5W 200-5 MCG/ML-% IV SOLN
0.0000 ug/min | Freq: Once | INTRAVENOUS | Status: AC
  Administered 2016-12-24: 5 ug/min via INTRAVENOUS
  Filled 2016-12-24: qty 250

## 2016-12-24 MED ORDER — AMLODIPINE BESYLATE 5 MG PO TABS: 5.0000 mg | ORAL_TABLET | Freq: Every day | ORAL | Status: DC

## 2016-12-24 MED ORDER — INSULIN ASPART 100 UNIT/ML ~~LOC~~ SOLN
0.0000 [IU] | SUBCUTANEOUS | Status: DC
  Administered 2016-12-24: 5 [IU] via SUBCUTANEOUS
  Administered 2016-12-24: 3 [IU] via SUBCUTANEOUS
  Administered 2016-12-24 – 2016-12-25 (×2): 1 [IU] via SUBCUTANEOUS
  Administered 2016-12-25: 5 [IU] via SUBCUTANEOUS
  Administered 2016-12-25: 21:00:00 3 [IU] via SUBCUTANEOUS
  Administered 2016-12-26: 2 [IU] via SUBCUTANEOUS
  Filled 2016-12-24: qty 1

## 2016-12-24 MED ORDER — ACETAMINOPHEN 325 MG PO TABS
650.0000 mg | ORAL_TABLET | ORAL | Status: DC | PRN
  Administered 2016-12-26 (×2): 650 mg via ORAL
  Filled 2016-12-24: qty 2

## 2016-12-24 MED ORDER — SODIUM CHLORIDE 0.9 % WEIGHT BASED INFUSION
1.0000 mL/kg/h | INTRAVENOUS | Status: DC
Start: 2016-12-25 — End: 2016-12-25
  Administered 2016-12-25 (×2): 1 mL/kg/h via INTRAVENOUS

## 2016-12-24 MED ORDER — ASPIRIN 81 MG PO CHEW
81.0000 mg | CHEWABLE_TABLET | Freq: Every day | ORAL | Status: DC
  Administered 2016-12-24: 81 mg via ORAL
  Filled 2016-12-24 (×2): qty 1

## 2016-12-24 MED ORDER — ASPIRIN 81 MG PO CHEW
81.0000 mg | CHEWABLE_TABLET | ORAL | Status: AC
  Administered 2016-12-25: 81 mg via ORAL
  Filled 2016-12-24: qty 1

## 2016-12-24 MED ORDER — CYCLOSPORINE 0.05 % OP EMUL
1.0000 [drp] | Freq: Two times a day (BID) | OPHTHALMIC | Status: DC
  Administered 2016-12-24 – 2016-12-26 (×4): 1 [drp] via OPHTHALMIC
  Filled 2016-12-24 (×8): qty 1

## 2016-12-24 MED ORDER — SODIUM CHLORIDE 0.9 % IV SOLN
250.0000 mL | INTRAVENOUS | Status: DC | PRN
  Administered 2016-12-24: 250 mL via INTRAVENOUS

## 2016-12-24 MED ORDER — HEPARIN BOLUS VIA INFUSION
4000.0000 [IU] | Freq: Once | INTRAVENOUS | Status: AC
  Administered 2016-12-24: 4000 [IU] via INTRAVENOUS
  Filled 2016-12-24: qty 4000

## 2016-12-24 NOTE — ED Notes (Signed)
Spoke with Dr. Waldemar Dickens he has spoken with cardiology and patient needs to be prioritized for stepdown bed at [facility]-Dr. Alcario Drought is notifying bed control

## 2016-12-24 NOTE — Progress Notes (Signed)
[place] for Heparin Indication: chest pain/ACS  Allergies  Allergen Reactions  . Oxycodone Itching  . Codeine Itching  . Morphine And Related Itching    Patient Measurements: Height: 5\' 9"  (175.3 cm) Weight: 184 lb (83.5 kg) IBW/kg (Calculated) : 70.7 Heparin Dosing Weight: 83.4  Vital Signs: Temp: 98.1 F (36.7 C) (11/18 1945) Temp Source: Oral (11/18 1945) BP: 132/82 (11/18 1945) Pulse Rate: 62 (11/18 1945)  Labs: Recent Labs    12/23/16 2201 12/24/16 0058 12/24/16 0100 12/24/16 0634 12/24/16 1031 12/24/16 2051  HGB 15.6  --   --   --   --   --   HCT 44.0  --   --   --   --   --   PLT 237  --   --   --   --   --   APTT  --  28  --   --   --   --   LABPROT  --  12.6  --   --   --   --   INR  --  0.95  --   --   --   --   HEPARINUNFRC  --   --   --   --  0.38 0.42  CREATININE 1.44*  --   --   --   --   --   TROPONINI  --   --  0.27* 1.76*  --   --     Estimated Creatinine Clearance: 45 mL/min (A) (by C-G formula based on SCr of 1.44 mg/dL (H)).   Medical History: Past Medical History:  Diagnosis Date  . Arthritis    Shoulder, knees, back  . 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 Cataract    right  . Chronic kidney disease    Stage II, per Dr. 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 Complication of anesthesia    "Too much with shoulder surgery", pt. reports that he was told the at they "lost him, due to absorbing too much anesthesia". Not enough for  Right ,  Left knee  just right.  . Constipation    occ  . Diabetes mellitus    type 2 diet controlled  . Dysrhythmia    has been irregular at times since age 69.  Marland Kitchen History of kidney stones   . Hypertension    treated/managed by Dr. Lysle Rubens, [facility]   . Kidney stones 2011   multiple times.   Assessment: 76 yo M PMHx HTN, HLD, DM2 who presented to [facility] with chest pain, NSTEMI. Started on heparin drip at [facility]. Pharmacy consulted for heparin at [facility]. Plan for [facility] +/- PCI  per MD.   Heparin level remains therapeutic. CBC stable, no bleeding documented.  Goal of Therapy:  Heparin level 0.3-0.7 units/ml Monitor platelets by anticoagulation protocol: Yes   Plan:  Continue heparin drip at 1000 units/hr Daily heparin level, CBC Monitor clinical course, s/sx bleeding   Elicia Lamp, PharmD, BCPS Clinical Pharmacist 12/24/2016 9:54 PM

## 2016-12-24 NOTE — Progress Notes (Signed)
ANTICOAGULATION CONSULT NOTE - Initial Consult  Pharmacy Consult for IV heparin Indication: chest pain/ACS  Allergies  Allergen Reactions  . Oxycodone Itching  . Codeine Itching  . Morphine And Related Itching    Patient Measurements: Height: 5\' 9"  (175.3 cm) Weight: 177 lb (80.3 kg) IBW/kg (Calculated) : 70.7 Heparin Dosing Weight: 80.3 kg  Vital Signs: BP: 137/88 (11/18 0045) Pulse Rate: 92 (11/18 0045)  Labs: Recent Labs    12/23/16 2201  HGB 15.6  HCT 44.0  PLT 237  CREATININE 1.44*    Estimated Creatinine Clearance: 45 mL/min (A) (by C-G formula based on SCr of 1.44 mg/dL (H)).   Medical History: Past Medical History:  Diagnosis Date  . Arthritis    Shoulder, knees, back  . 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 Cataract    right  . Chronic kidney disease    Stage II, per Dr. 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 Complication of anesthesia    "Too much with shoulder surgery", pt. reports that he was told the at they "lost him, due to absorbing too much anesthesia". Not enough for  Right ,  Left knee  just right.  . Constipation    occ  . Diabetes mellitus    type 2 diet controlled  . Dysrhythmia    has been irregular at times since age 58.  Marland Kitchen History of kidney stones   . Hypertension    treated/managed by Dr. Lysle Rubens, [facility]   . Kidney stones 2011   multiple times.    Medications:  Scheduled:  . heparin  4,000 Units Intravenous Once   Infusions:  . heparin      Assessment: 76 yoM c/o CP. IV heparin for ACS.  Plts=237 H/H=15.6/44 Goal of Therapy:  Heparin level 0.3-0.7 units/ml Monitor platelets by anticoagulation protocol: Yes   Plan:  Baseline coags STAT Heparin 4000 unit bolus x1 now Start drip at 1000 units/hr Daily CBC/HL Check 1st HL in 8 hours  Dorrene German 12/24/2016,12:47 AM

## 2016-12-24 NOTE — ED Notes (Signed)
ED TO INPATIENT HANDOFF REPORT  Name/Age/Gender Matthew [name] 76 y.o. male  Code Status    Code Status Orders  (From admission, onward)        Start     Ordered   12/24/16 0130  Full code  Continuous     12/24/16 0130    Code Status History    Date Active Date Inactive Code Status Order ID Comments User Context   12/06/2012 11:39 12/07/2012 15:24 Full Code [id number]  Augustin Schooling, MD Inpatient      Home/SNF/Other Home  Chief Complaint Possible Heart Attack   Level of Care/Admitting Diagnosis ED Disposition    ED Disposition Condition [place]ace: [place] [[id number]]  Level of Care: Stepdown [14]  Diagnosis: Chest pain, rule out acute myocardial infarction [412878]  Admitting Physician: Etta Quill [[id number]]  Attending Physician: Etta Quill [[id number]]  Bed request comments: May go to chest pain floor bed  PT Class (Do Not Modify): Observation [104]  PT Acc Code (Do Not Modify): Observation [10022]       Medical History Past Medical History:  Diagnosis Date  . Arthritis    Shoulder, knees, back  . 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 Cataract    right  . Chronic kidney disease    Stage II, per Dr. 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 Complication of anesthesia    "Too much with shoulder surgery", pt. reports that he was told the at they "lost him, due to absorbing too much anesthesia". Not enough for  Right ,  Left knee  just right.  . Constipation    occ  . Diabetes mellitus    type 2 diet controlled  . Dysrhythmia    has been irregular at times since age 43.  Marland Kitchen History of kidney stones   . Hypertension    treated/managed by Dr. Lysle Rubens, [facility]   . Kidney stones 2011   multiple times.    Allergies Allergies  Allergen Reactions  . Oxycodone Itching  . Codeine Itching  . Morphine And Related Itching    IV Location/Drains/Wounds Patient Lines/Drains/Airways Status   Active Line/Drains/Airways    Name:   Placement date:   Placement time:   Site:   Days:   Peripheral IV 02/02/12 Left;Distal Hand   02/02/12    0705    Hand   1787   Peripheral IV 12/23/16 Left Hand   12/23/16    2225    Hand   1   Peripheral IV 12/24/16 Right;Posterior Forearm   12/24/16    0100    Forearm   less than 1   Airway   06/05/16    1140     202   Incision 02/02/12 Back Bilateral   02/02/12    1357     1787   Incision 12/06/12 Shoulder Left   12/06/12    0810     1479   Incision (Closed) 06/09/15 Back Other (Comment)   06/09/15    0948     564          Labs/Imaging Results for orders placed or performed during the hospital encounter of 12/23/16 (from the past 48 hour(s))  Basic metabolic panel     Status: Abnormal   Collection Time: 12/23/16 10:01 PM  Result Value Ref Range   Sodium 136 135 - 145 mmol/L   Potassium 4.0 3.5 - 5.1 mmol/L  Chloride 100 (L) 101 - 111 mmol/L   CO2 24 22 - 32 mmol/L   Glucose, Bld 288 (H) 65 - 99 mg/dL   BUN 31 (H) 6 - 20 mg/dL   Creatinine, Ser 1.44 (H) 0.61 - 1.24 mg/dL   Calcium 9.4 8.9 - 10.3 mg/dL   GFR calc non Af Amer 46 (L) >60 mL/min   GFR calc Af Amer 54 (L) >60 mL/min    Comment: (NOTE) The eGFR has been calculated using the CKD EPI equation. This calculation has not been validated in all clinical situations. eGFR's persistently <60 mL/min signify possible Chronic Kidney Disease.    Anion gap 12 5 - 15  CBC     Status: None   Collection Time: 12/23/16 10:01 PM  Result Value Ref Range   WBC 7.8 4.0 - 10.5 K/uL   RBC 4.92 4.22 - 5.81 MIL/uL   Hemoglobin 15.6 13.0 - 17.0 g/dL   HCT 44.0 39.0 - 52.0 %   MCV 89.4 78.0 - 100.0 fL   MCH 31.7 26.0 - 34.0 pg   MCHC 35.5 30.0 - 36.0 g/dL   RDW 12.5 11.5 - 15.5 %   Platelets 237 150 - 400 K/uL  I-stat troponin, ED     Status: None   Collection Time: 12/23/16 10:10 PM  Result Value Ref Range   Troponin i, poc 0.00 0.00 - 0.08 ng/mL   Comment 3            Comment: Due to the release kinetics of cTnI, a  negative result within the first hours of the onset of symptoms does not rule out myocardial infarction with certainty. If myocardial infarction is still suspected, repeat the test at appropriate intervals.   APTT     Status: None   Collection Time: 12/24/16 12:58 AM  Result Value Ref Range   aPTT 28 24 - 36 seconds  Protime-INR     Status: None   Collection Time: 12/24/16 12:58 AM  Result Value Ref Range   Prothrombin Time 12.6 11.4 - 15.2 seconds   INR 0.95   Troponin I     Status: Abnormal   Collection Time: 12/24/16  1:00 AM  Result Value Ref Range   Troponin I 0.27 (HH) <0.03 ng/mL    Comment: CRITICAL RESULT CALLED TO, READ BACK BY AND VERIFIED WITH: T DOSTER,RN'@0203'  12/24/16 MKELLY   CBG monitoring, ED     Status: None   Collection Time: 12/24/16  4:39 AM  Result Value Ref Range   Glucose-Capillary 96 65 - 99 mg/dL   Comment 1 Notify RN    Comment 2 Document in Chart    Dg Chest 2 View  Result Date: 12/23/2016 CLINICAL DATA:  76 year old male with chest pain and shortness of breath. EXAM: CHEST  2 VIEW COMPARISON:  Chest radiograph dated 06/03/2015 FINDINGS: The lungs are clear. There is no pleural effusion or pneumothorax. The cardiac silhouette is within normal limits. There is atherosclerotic calcification of the aortic arch. There is degenerative changes of the spine. There is a left shoulder arthroplasty. No acute osseous pathology. IMPRESSION: No active cardiopulmonary disease. Electronically Signed   By: Anner Crete M.D.   On: 12/23/2016 22:19    Pending Labs Unresulted Labs (From admission, onward)   Start     Ordered   12/24/16 0900  Heparin level (unfractionated)  Once-Timed,   R     12/24/16 0122   12/24/16 0900  CBC  Daily,   R  12/24/16 0122   12/24/16 0600  Lipid panel  Once,   R     12/24/16 0232   12/24/16 0500  Hemoglobin A1c  Tomorrow morning,   R     12/24/16 0120   12/24/16 0121  Troponin I (q 6hr x 3)  Now then every 6 hours,   R      12/24/16 0120      Vitals/Pain Today's Vitals   12/24/16 0600 12/24/16 0618 12/24/16 0652 12/24/16 0700  BP: 115/77 115/77 115/77 136/81  Pulse: 64 75 61 72  Resp: '12 19 13 17  ' SpO2: 96% 97% 97% 97%  Weight:      Height:      PainSc:        Isolation Precautions No active isolations  Medications Medications  heparin ADULT infusion 100 units/mL (25000 units/276m sodium chloride 0.45%) (1,000 Units/hr Intravenous New Bag/Given 12/24/16 0054)  cycloSPORINE (RESTASIS) 0.05 % ophthalmic emulsion 1 drop (1 drop Both Eyes Not Given 12/24/16 0306)  insulin aspart (novoLOG) injection 0-9 Units (0 Units Subcutaneous Not Given 12/24/16 0333)  nitroGLYCERIN (NITROGLYN) 2 % ointment 1 inch (1 inch Topical Given 12/24/16 0546)  acetaminophen (TYLENOL) tablet 650 mg (not administered)  ondansetron (ZOFRAN) injection 4 mg (not administered)  atorvastatin (LIPITOR) tablet 80 mg (not administered)  aspirin chewable tablet 81 mg (not administered)  aspirin chewable tablet 324 mg (324 mg Oral Given 12/23/16 2356)  nitroGLYCERIN (NITROGLYN) 2 % ointment 1 inch (1 inch Topical Given 12/23/16 2356)  nitroGLYCERIN 50 mg in dextrose 5 % 250 mL (0.2 mg/mL) infusion (0 mcg/min Intravenous Stopped 12/24/16 0112)  heparin bolus via infusion 4,000 Units (4,000 Units Intravenous Bolus from Bag 12/24/16 0054)    Mobility walks

## 2016-12-24 NOTE — Consult Note (Addendum)
Cardiology Consultation:   Patient ID: Matthew [name]; [id number]; Jun 05, 1940   Admit date: 12/23/2016 Date of Consult: 12/24/2016  Primary Care Provider: Wenda Low, MD Primary Cardiologist: None Primary Electrophysiologist:  None Requesting Physician: Etta Quill, DO   Patient Profile:   Matthew [name] is a 76 y.o. male with a hx of HTN, HLD, DM2, who presented with chest pain and NSTEMI.   History of Present Illness:   [name] is a 76 y.o. male with a hx of HTN, HLD, DM2, who presented with chest pain and NSTEMI.   The patient has no known cardiac history. He reports that he was in his normal state of health until this evening, when he experienced chest pressure while carrying jugs of water to the car. He described the pain to be a sensation "like an elephant was sitting on [his] chest" with radiation to his right arm. He had some dyspnea but denied other associated symptoms. He had a similar episode of chest discomfort several weeks ago that resolved with rest. However, this episode did not improve, so he went to the ED.  He [facility] ED. He was initially hypertensive with SBP in the 150s. ECG showed NSR with RBBB (noted previously although now with increased ST depressions in lateral precordial leads). Initial troponin was negative, but second measurement was 0.27. He was given NTG with improvement of his chest pain and was started on a NTG infusion, although his SBP dropped to 70s, and this infusion was discontinued. NTG paste was placed. [facility] to [facility], and cardiology was consulted.  On my evaluation, the patient is resting comfortably in bed. He reports that his pain has significantly improved and has essentially resolved (down from 9-10/10 to 1-2/10). He denies any other complaints. ECG was repeated and showed some improvement of his lateral ST depressions.   Past Medical History:  Diagnosis Date  . Arthritis    Shoulder, knees,  back  . 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 Cataract    right  . Chronic kidney disease    Stage II, per Dr. 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 Complication of anesthesia    "Too much with shoulder surgery", pt. reports that he was told the at they "lost him, due to absorbing too much anesthesia". Not enough for  Right ,  Left knee  just right.  . Constipation    occ  . Diabetes mellitus    type 2 diet controlled  . Dysrhythmia    has been irregular at times since age 22.  Marland Kitchen History of kidney stones   . Hypertension    treated/managed by Dr. Lysle Rubens, [facility]   . Kidney stones 2011   multiple times.    Past Surgical History:  Procedure Laterality Date  . COLONOSCOPY WITH PROPOFOL N/A 06/05/2016   Performed by Garlan Fair, MD at [facility] [place]  . EYE SURGERY     Cataract Left eye  . fisture      x 2 anal fissure  . HAND SURGERY     x2 for ligament rebuilt  . KNEE SURGERY  2009   Right- first arthroscopy, then partial replacement- [facility]   . Laminectomy - T12-L1 N/A 06/09/2015   Performed by Eustace Moore, MD at [facility] NEURO ORS  . LEFT TOTAL SHOULDER ARTHROPLASTY VERSES A REVERSE TOTAL SHOULDER ARTHROPLASTY Left 12/06/2012   Performed by Augustin Schooling, MD at [facility] OR  . LITHOTRIPSY    .  MEDIAL PARTIAL KNEE REPLACEMENT  2010   [facility] prior to [facility] also had arthroscopic suurgery   . POSTERIOR LUMBAR FUSION  02/02/2012  . POSTERIOR LUMBAR FUSION 2 LEVEL N/A 02/02/2012   Performed by Eustace Moore, MD at [facility] NEURO ORS  . REVERSE SHOULDER ARTHROPLASTY Left 12/06/2012   Dr Veverly Fells  . SHOULDER SURGERY  1985   L arthroscopic      Home Medications:  Prior to Admission medications   Medication Sig Start Date End Date Taking? Authorizing Provider  amLODipine (NORVASC) 5 MG tablet Take 5 mg by mouth daily.   Yes [provider]  losartan-hydrochlorothiazide (HYZAAR) 100-12.5 MG per tablet Take 1 tablet by mouth daily before breakfast.    Yes  [provider]  naproxen sodium (ALEVE) 220 MG tablet Take 440 mg by mouth daily as needed (pain).   Yes [provider]  Omega-3 Fatty Acids (FISH OIL) 1000 MG CAPS Take 1,000 mg by mouth daily.   Yes [provider]  RESTASIS 0.05 % ophthalmic emulsion Place 1 drop 2 (two) times daily into both eyes. 10/25/16  Yes [provider]  TRESIBA FLEXTOUCH 100 UNIT/ML SOPN FlexTouch Pen Inject 20 Units daily after breakfast into the skin. 11/23/16  Yes [provider]    Inpatient Medications: Scheduled Meds: . aspirin  81 mg Oral Daily  . atorvastatin  80 mg Oral q1800  . cycloSPORINE  1 drop Both Eyes BID  . insulin aspart  0-9 Units Subcutaneous Q4H  . nitroGLYCERIN  1 inch Topical Q6H   Continuous Infusions: . heparin 1,000 Units/hr (12/24/16 0054)   PRN Meds: acetaminophen, ondansetron (ZOFRAN) IV  Allergies:    Allergies  Allergen Reactions  . Oxycodone Itching  . Codeine Itching  . Morphine And Related Itching    Social History:   Social History   Socioeconomic History  . Marital status: Married    Spouse name: Not on file  . Number of children: Not on file  . Years of education: Not on file  . Highest education level: Not on file  Social Needs  . Financial resource strain: Not on file  . Food insecurity - worry: Not on file  . Food insecurity - inability: Not on file  . Transportation needs - medical: Not on file  . Transportation needs - non-medical: Not on file  Occupational History  . Not on file  Tobacco Use  . Smoking status: Never Smoker  . Smokeless tobacco: Never Used  Substance and Sexual Activity  . Alcohol use: No  . Drug use: No  . Sexual activity: Yes  Other Topics Concern  . Not on file  Social History Narrative  . Not on file    Family History:   Father died of MI in 4s; sister with CABG in 65s  ROS:  Please see the history of present illness.  All other ROS reviewed and negative.      Physical Exam/Data:   Vitals:   12/24/16 0230 12/24/16 0245 12/24/16 0300 12/24/16 0315  BP: 130/81 113/78 128/82 (!) 144/87  Pulse: 77 81 86 82  Resp: 14 11 14 12   SpO2: 98% 97% 98% 96%  Weight:      Height:       No intake or output data in the 24 hours ending 12/24/16 0405 Filed Weights   12/23/16 2202  Weight: 80.3 kg (177 lb)   Body mass index is 26.14 kg/m.  General:  Well nourished, well  developed, in no acute distress HEENT: normal Neck: no JVD Endocrine:  No thryomegaly Cardiac:  normal S1, S2; RRR; no murmur Lungs:  clear to auscultation bilaterally, no wheezing, rhonchi or rales  Abd: soft, nontender, no hepatomegaly  Ext: no edema Musculoskeletal:  No deformities, BUE and BLE strength normal and equal Skin: warm and dry  Neuro:  no focal abnormalities noted Psych:  Normal affect   EKG:  The EKG was personally reviewed and demonstrates:  NSR with RBBB and lateral ST depressions  Telemetry:  Ordered  Relevant CV Studies: None  Laboratory Data:  Chemistry Recent Labs  Lab 12/23/16 2201  NA 136  K 4.0  CL 100*  CO2 24  GLUCOSE 288*  BUN 31*  CREATININE 1.44*  CALCIUM 9.4  GFRNONAA 46*  GFRAA 54*  ANIONGAP 12    No results for input(s): PROT, ALBUMIN, AST, ALT, ALKPHOS, BILITOT in the last 168 hours. Hematology Recent Labs  Lab 12/23/16 2201  WBC 7.8  RBC 4.92  HGB 15.6  HCT 44.0  MCV 89.4  MCH 31.7  MCHC 35.5  RDW 12.5  PLT 237   Cardiac Enzymes Recent Labs  Lab 12/24/16 0100  TROPONINI 0.27*    Recent Labs  Lab 12/23/16 2210  TROPIPOC 0.00    BNPNo results for input(s): BNP, PROBNP in the last 168 hours.  DDimer No results for input(s): DDIMER in the last 168 hours.  Radiology/Studies:  Dg Chest 2 View  Result Date: 12/23/2016 CLINICAL DATA:  76 year old male with chest pain and shortness of breath. EXAM: CHEST  2 VIEW COMPARISON:  Chest radiograph dated 06/03/2015 FINDINGS: The lungs are clear. There is no pleural  effusion or pneumothorax. The cardiac silhouette is within normal limits. There is atherosclerotic calcification of the aortic arch. There is degenerative changes of the spine. There is a left shoulder arthroplasty. No acute osseous pathology. IMPRESSION: No active cardiopulmonary disease. Electronically Signed   By: Anner Crete M.D.   On: 12/23/2016 22:19    Assessment and Plan:   Antinio Sanderfer is a 76 y.o. male with a hx of HTN, HLD, DM2, who presented with chest pain and NSTEMI.   NSTEMI The patient presented to the ED with chest pain with workup notable for ischemic ECG changes and positive troponin. He has no known CAD but has multiple risk factors including age, gender, HTN, HLD, and DM. His presentation is therefore consistent with NSTEMI. He reports that his chest pain has essentially resoled with NTG by my evaluation. At this time, the patient will need ongoing treatment for ACS with close monitoring of symptoms. He will need further ischemic evaluation with LHC.  -Continue to monitor on telemetry -Trend troponin -s/p ASA 325. Continue 81 mg daily. -Starting atorvastatin 80 mg daily -Recommend initiation of metoprolol when BP allows -Continue NTG for chest pain. -Echocardiogram ordered -Pt urged to let team know if his chest pain returns. -Keep NPO. Timing of LHC to be determined based on clinical trajectory.   For questions or updates, please contact [place] Please consult [email or web address] for contact info under Cardiology/STEMI.   Signed, Nila Nephew, MD  12/24/2016 4:05 AM

## 2016-12-24 NOTE — Progress Notes (Signed)
Spoke with Ellen Henri, cardiology PA - troponins are trending up however patient is chest pain free. Stated ok for patient to eat, plan for heart cath tomorrow.

## 2016-12-24 NOTE — Progress Notes (Signed)
    Matthew [name] is a 76 y.o. male with a hx of HTN, HLD, DM2, who initially presented to [facility] with chest pain and ruled in for NSTEMI. He was seen by fellow overnight, started on IV heparin and transferred to [facility] for further management.   He is roomed and hemodynamically stable. Troponin up from 0.27>>1.76. CP improved but still with mild SSC pressure. He did not tolerate IV nitro at [facility]. Had hypotension and required IVFs. Now with nitro patch. SBP in the 130s. Continue IV heparin. Plan for [facility] +/- PCI tomorrow. ASA, high dose Lipitor and metoprolol initiated. If worsening CP or EKG changes, will take to cath lab today.   I have reviewed the risks, indications, and alternatives to cardiac catheterization and possible angioplasty/stenting with the patient. Risks include but are not limited to bleeding, infection, vascular injury, stroke, myocardial infection, arrhythmia, kidney injury, radiation-related injury in the case of prolonged fluoroscopy use, emergency cardiac surgery, and death. The patient understands the risks of serious complication is low (<7%).   I have placed name on cath board for tomorrow. Cath orders placed NPO at midnight. He has mild renal insuffiencey w/ SCr at 1.44. His home losartan/HCT is not ordered. Will order precath hydration to start at 6AM tomorrow morning. We will also order a 2D echo.   Lyda Jester   The patient was seen by a fellow earlier today, however required additional attention for decision making. He has mild residual chest pain, 2/10, he can tolerate it, on physical exam, he has no JVDs, no murmur, clear lungs to auscultation, no LE edema.  He is not in distress.  ECG shows SR, RBBB unchanged from ECG in 2017.  He was given nitro paste, we will monitor him closely as he developed hypotension yesterday after NTG. Plan is for a cath in the morning unless his condition changes. Echo to be done today. Continue Heparin drip, aspirin, atorvastatin,  metoprolol.   Ena Dawley, MD 12/24/2016

## 2016-12-24 NOTE — ED Notes (Signed)
No reaction to medication noted alert and oriented x 3 no respiratory or acute distress noted family at bedside call light in reach.

## 2016-12-24 NOTE — Progress Notes (Signed)
Update: 2nd trop has turned positive at 0.27.  Will call and let cards fellow know.

## 2016-12-24 NOTE — ED Notes (Signed)
No respiratory or acute distress noted alert and oriented x 3 no pain or pressure in chest voiced family at bedside no reaction to medication noted.

## 2016-12-24 NOTE — Progress Notes (Signed)
ANTICOAGULATION CONSULT NOTE - Initial Consult  Pharmacy Consult for Heparin Indication: chest pain/ACS  Allergies  Allergen Reactions  . Oxycodone Itching  . Codeine Itching  . Morphine And Related Itching    Patient Measurements: Height: 5\' 9"  (175.3 cm) Weight: 184 lb (83.5 kg) IBW/kg (Calculated) : 70.7 Heparin Dosing Weight: 83.4  Vital Signs: Temp: 98 F (36.7 C) (11/18 0843) Temp Source: Oral (11/18 0843) BP: 144/84 (11/18 0843) Pulse Rate: 73 (11/18 0843)  Labs: Recent Labs    12/23/16 2201 12/24/16 0058 12/24/16 0100 12/24/16 0634 12/24/16 1031  HGB 15.6  --   --   --   --   HCT 44.0  --   --   --   --   PLT 237  --   --   --   --   APTT  --  28  --   --   --   LABPROT  --  12.6  --   --   --   INR  --  0.95  --   --   --   HEPARINUNFRC  --   --   --   --  0.38  CREATININE 1.44*  --   --   --   --   TROPONINI  --   --  0.27* 1.76*  --     Estimated Creatinine Clearance: 45 mL/min (A) (by C-G formula based on SCr of 1.44 mg/dL (H)).   Medical History: Past Medical History:  Diagnosis Date  . Arthritis    Shoulder, knees, back  . 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 Cataract    right  . Chronic kidney disease    Stage II, per Dr. 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 Complication of anesthesia    "Too much with shoulder surgery", pt. reports that he was told the at they "lost him, due to absorbing too much anesthesia". Not enough for  Right ,  Left knee  just right.  . Constipation    occ  . Diabetes mellitus    type 2 diet controlled  . Dysrhythmia    has been irregular at times since age 59.  Marland Kitchen History of kidney stones   . Hypertension    treated/managed by Dr. Lysle Rubens, [facility]   . Kidney stones 2011   multiple times.   Assessment: 76 yo M PMHx HTN, HLD, DM2 who presented to [facility] with chest pain, NSTEMI. Started on heparin drip at [facility]. Pharmacy consulted for heparin at [facility]. Plan for [facility] +/- PCI per MD.   Initial heparin level therapeutic  at 0.38. CBC stable, no bleeding documented.  Goal of Therapy:  Heparin level 0.3-0.7 units/ml Monitor platelets by anticoagulation protocol: Yes   Plan:  Continue heparin drip at 1000 units/hr 8 hour confirmatory heparin level Daily heparin level, CBC Monitor clinical course, s/sx bleeding  Nida Boatman, PharmD PGY1 Acute Care Pharmacy Resident Pager: [phone] 12/24/2016,11:57 AM

## 2016-12-24 NOTE — ED Notes (Signed)
Wife given geri-chair to rest with pillow and warm blankets

## 2016-12-24 NOTE — ED Notes (Signed)
Dr. Alcario Drought in room states he doesn't want a EKG at this time.

## 2016-12-24 NOTE — ED Notes (Signed)
Date and time results received: 12/24/16 0158Test: Troponin Critical Value:0.27  Name of Provider Notified: Dr. Jennette Kettle  Orders Received? Or Actions Taken?: Attempting to obtain stepdown bed at [facility]

## 2016-12-24 NOTE — Progress Notes (Signed)
Progress Note    Matthew [name]  CHY:[id number] DOB: [birth year]  DOA: 12/23/2016 PCP: Wenda Low, MD    Brief Narrative:   Chief complaint: Follow-up chest pain  Medical records reviewed and are as summarized below:  Matthew [name] is an 76 y.o. male with PMH of type 2 diabetes, hypertension who was admitted 12/23/16 for evaluation of chest pain/pressure improved with nitroglycerin and aspirin. Troponin elevation noted during initial workup.  Assessment/Plan:   Principal Problem:   Chest pain, rule out acute myocardial infarction Continue aspirin and heparin drip. Troponin bump from 0.27---> 1.76. Ischemic changes noted on EKG. Atorvastatin therapy initiated. Initiate metoprolol at low dose and titrate as blood pressure tolerates. Nitroglycerin when necessary chest pain. 2-D echo ordered. Likely will need an ischemic evaluation/cardiac catheterization, await cardiology final recommendations.  Active Problems:   DM2 (diabetes mellitus, type 2) (HCC) Continue insulin sensitive SSI every 4 hours. Currently nothing by mouth until evaluation by cardiology. Follow-up hemoglobin A1c.    HTN (hypertension) Currently being managed with Norvasc.    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 Unknown baseline.  Body mass index is 27.17 kg/m.   Family Communication/Anticipated D/C date and plan/Code Status   DVT prophylaxis: Heparin ordered. Code Status: Full Code.  Family Communication: Family updated at bedside. Disposition Plan: Home after cardiac catheterization if clear.   Medical Consultants:    Cardiology   Anti-Infectives:    None  Subjective:   Reports some minor residual chest tightness/pressure, but no frank pain. No shortness of breath. No diaphoresis or nausea.  Objective:    Vitals:   12/24/16 0652 12/24/16 0700 12/24/16 0800 12/24/16 0843  BP: 115/77 136/81 124/81 (!) 144/84  Pulse: 61 72 65 73  Resp: 13  17 11 17   Temp:    98 F (36.7 C)  TempSrc:    Oral  SpO2: 97% 97% 97% 99%  Weight:    83.5 kg (184 lb)  Height:    5\' 9"  (1.753 m)   No intake or output data in the 24 hours ending 12/24/16 0904 Filed Weights   12/23/16 2202 12/24/16 0843  Weight: 80.3 kg (177 lb) 83.5 kg (184 lb)    Exam: General: No acute distress. Cardiovascular: Heart sounds show a regular rate, and rhythm. No gallops or rubs. No murmurs. No JVD. Lungs: Clear to auscultation bilaterally with good air movement. No rales, rhonchi or wheezes. Abdomen: Soft, nontender, nondistended with normal active bowel sounds. No masses. No hepatosplenomegaly. Neurological: Alert and oriented 3. Moves all extremities 4 with equal strength. Cranial nerves II through XII grossly intact. Skin: Warm and dry. No rashes or lesions. Extremities: No clubbing or cyanosis. No edema. Pedal pulses 2+. Psychiatric: Mood and affect are normal. Insight and judgment are fair.   Data Reviewed:   I have personally reviewed following labs and imaging studies:  Labs: Labs show the following:   Basic Metabolic Panel: Recent Labs  Lab 12/23/16 2201  NA 136  K 4.0  CL 100*  CO2 24  GLUCOSE 288*  BUN 31*  CREATININE 1.44*  CALCIUM 9.4   GFR Estimated Creatinine Clearance: 45 mL/min (A) (by C-G formula based on SCr of 1.44 mg/dL (H)). Coagulation profile Recent Labs  Lab 12/24/16 0058  INR 0.95    CBC: Recent Labs  Lab 12/23/16 2201  WBC 7.8  HGB 15.6  HCT 44.0  MCV 89.4  PLT 237   Cardiac Enzymes: Recent Labs  Lab 12/24/16 0100 12/24/16 0634  TROPONINI 0.27* 1.76*   CBG: Recent Labs  Lab 12/24/16 0439 12/24/16 0852  GLUCAP 96 140*   Microbiology No results found for this or any previous visit (from the past 240 hour(s)).  Procedures and diagnostic studies:  12/23/16 Dg Chest 2 View: My independent review of the image shows: Clear lungs. Normal heart size. No infiltrates.  Medications:   . aspirin   81 mg Oral Daily  . atorvastatin  80 mg Oral q1800  . cycloSPORINE  1 drop Both Eyes BID  . insulin aspart  0-9 Units Subcutaneous Q4H  . nitroGLYCERIN  1 inch Topical Q6H   Continuous Infusions: . heparin 1,000 Units/hr (12/24/16 0054)     LOS: 0 days   Jacquelynn Cree  [facility] Pager [phone]. If unable to reach me by pager, please call my cell phone at [phone].  *Please refer to [email or web address], password [email or web address] to get updated schedule on who will round on this patient, as hospitalists switch teams weekly. If 7PM-7AM, please contact night-coverage at [email or web address], password [email or web address] for any overnight needs.  12/24/2016, 9:04 AM

## 2016-12-24 NOTE — ED Notes (Signed)
Pt on 94mcg of nitro with sweating and blood pressure drop started IV NS bolus and pt rebounded blood pressure to 118/88 no respiratory or acute distress noted alert and oriented x 3.

## 2016-12-24 NOTE — H&P (Signed)
History and Physical    Matthew [name] TGG:[id number] DOB: 1940-09-09 DOA: 12/23/2016  PCP: Wenda Low, MD  Patient coming from: Home  I have personally briefly reviewed patient's old medical records in [place]  Chief Complaint: Chest pain  HPI: Matthew [name] is a 76 y.o. male with medical history significant of DM2, HTN.  Patient presents to the ED with c/o central chest "pressure" like pain.  Onset 9:30pm.  R arm "feels like he has a blood pressure cuff around it".     ED Course: Pain was 8/10 initially, improved to 4/10 with NTG and ASA by EMS.  Improved to 1/10 with NTG paste in ED.  Got put on a NTG gtt in ED but this tanked his BP down into the upper 70J systolic so this was stopped.  BP rebounded to 500 systolic, putting him back on NTG paste instead.   Review of Systems: As per HPI otherwise 10 point review of systems negative.   Past Medical History:  Diagnosis Date  . Arthritis    Shoulder, knees, back  . 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 Cataract    right  . Chronic kidney disease    Stage II, per Dr. 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 Complication of anesthesia    "Too much with shoulder surgery", pt. reports that he was told the at they "lost him, due to absorbing too much anesthesia". Not enough for  Right ,  Left knee  just right.  . Constipation    occ  . Diabetes mellitus    type 2 diet controlled  . Dysrhythmia    has been irregular at times since age 64.  Marland Kitchen History of kidney stones   . Hypertension    treated/managed by Dr. Lysle Rubens, [facility]   . Kidney stones 2011   multiple times.    Past Surgical History:  Procedure Laterality Date  . COLONOSCOPY WITH PROPOFOL N/A 06/05/2016   Performed by Garlan Fair, MD at [facility]  . EYE SURGERY     Cataract Left eye  . fisture      x 2 anal fissure  . HAND SURGERY     x2 for ligament rebuilt  . KNEE SURGERY  2009   Right- first arthroscopy, then partial replacement- [facility]     . Laminectomy - T12-L1 N/A 06/09/2015   Performed by Eustace Moore, MD at [facility] NEURO ORS  . LEFT TOTAL SHOULDER ARTHROPLASTY VERSES A REVERSE TOTAL SHOULDER ARTHROPLASTY Left 12/06/2012   Performed by Augustin Schooling, MD at [facility] OR  . LITHOTRIPSY    . MEDIAL PART[facility]NEE [facility]  [facility]   [facility] prior to [facility] also had arthroscopic suurgery   . POSTERIOR LUMBAR FUSION  02/02/2012  . POSTERIOR LUMBAR FUSION 2 LEVEL N/A 02/02/2012   Performed by Eustace Moore, MD at [facility] NEURO ORS  . REVERSE SHOULDER ARTHROPLASTY Left 12/06/2012   Dr Veverly Fells  . SHOULDER SURGERY  1985   L arthroscopic      reports that  has never smoked. he has never used smokeless tobacco. He reports that he does not drink alcohol or use drugs.  Allergies  Allergen Reactions  . Oxycodone Itching  . Codeine Itching  . Morphine And Related Itching    History reviewed. No pertinent family history.   Prior to Admission medications   Medication Sig Start Date End Date Taking? Authorizing Provider  amLODipine (NORVASC) 5 MG  tablet Take 5 mg by mouth daily.   Yes [provider]  losartan-hydrochlorothiazide (HYZAAR) 100-12.5 MG per tablet Take 1 tablet by mouth daily before breakfast.    Yes [provider]  naproxen sodium (ALEVE) 220 MG tablet Take 440 mg by mouth daily as needed (pain).   Yes [provider]  Omega-3 Fatty Acids (FISH OIL) 1000 MG CAPS Take 1,000 mg by mouth daily.   Yes [provider]  RESTASIS 0.05 % ophthalmic emulsion Place 1 drop 2 (two) times daily into both eyes. 10/25/16  Yes [provider]  TRESIBA FLEXTOUCH 100 UNIT/ML SOPN FlexTouch Pen Inject 20 Units daily after breakfast into the skin. 11/23/16  Yes [provider]    Physical Exam: Vitals:   12/24/16 0030 12/24/16 0045 12/24/16 0100 12/24/16 0115  BP: (!) 141/89 137/88 (!) 78/55 (!) 156/94  Pulse: 88 92 65 83  Resp: 16 (!) 21 20 13   SpO2: 96% 97% 99% 97%   Weight:      Height:        Constitutional: NAD, calm, comfortable Eyes: PERRL, lids and conjunctivae normal ENMT: Mucous membranes are moist. Posterior pharynx clear of any exudate or lesions.Normal dentition.  Neck: normal, supple, no masses, no thyromegaly Respiratory: clear to auscultation bilaterally, no wheezing, no crackles. Normal respiratory effort. No accessory muscle use.  Cardiovascular: Regular rate and rhythm, no murmurs / rubs / gallops. No extremity edema. 2+ pedal pulses. No carotid bruits.  Abdomen: no tenderness, no masses palpated. No hepatosplenomegaly. Bowel sounds positive.  Musculoskeletal: no clubbing / cyanosis. No joint deformity upper and lower extremities. Good ROM, no contractures. Normal muscle tone.  Skin: no rashes, lesions, ulcers. No induration Neurologic: CN 2-12 grossly intact. Sensation intact, DTR normal. Strength 5/5 in all 4.  Psychiatric: Normal judgment and insight. Alert and oriented x 3. Normal mood.    Labs on Admission: I have personally reviewed following labs and imaging studies  CBC: Recent Labs  Lab 12/23/16 2201  WBC 7.8  HGB 15.6  HCT 44.0  MCV 89.4  PLT 626   Basic Metabolic Panel: Recent Labs  Lab 12/23/16 2201  NA 136  K 4.0  CL 100*  CO2 24  GLUCOSE 288*  BUN 31*  CREATININE 1.44*  CALCIUM 9.4   GFR: Estimated Creatinine Clearance: 45 mL/min (A) (by C-G formula based on SCr of 1.44 mg/dL (H)). Liver Function Tests: No results for input(s): AST, ALT, ALKPHOS, BILITOT, PROT, ALBUMIN in the last 168 hours. No results for input(s): LIPASE, AMYLASE in the last 168 hours. No results for input(s): AMMONIA in the last 168 hours. Coagulation Profile: No results for input(s): INR, PROTIME in the last 168 hours. Cardiac Enzymes: No results for input(s): CKTOTAL, CKMB, CKMBINDEX, TROPONINI in the last 168 hours. BNP (last 3 results) No results for input(s): PROBNP in the last 8760 hours. HbA1C: No results for  input(s): HGBA1C in the last 72 hours. CBG: No results for input(s): GLUCAP in the last 168 hours. Lipid Profile: No results for input(s): CHOL, HDL, LDLCALC, TRIG, CHOLHDL, LDLDIRECT in the last 72 hours. Thyroid Function Tests: No results for input(s): TSH, T4TOTAL, FREET4, T3FREE, THYROIDAB in the last 72 hours. Anemia Panel: No results for input(s): VITAMINB12, FOLATE, FERRITIN, TIBC, IRON, RETICCTPCT in the last 72 hours. Urine analysis:    Component Value Date/Time   COLORURINE YELLOW 07/21/2009 1028   APPEARANCEUR CLOUDY (A) 07/21/2009 1028   LABSPEC 1.015 07/21/2009 1028   PHURINE 5.5 07/21/2009 1028  GLUCOSEU 100 (A) 07/21/2009 1028   HGBUR LARGE (A) 07/21/2009 1028   BILIRUBINUR NEGATIVE 07/21/2009 1028   KETONESUR NEGATIVE 07/21/2009 1028   PROTEINUR 30 (A) 07/21/2009 1028   UROBILINOGEN 0.2 07/21/2009 1028   NITRITE NEGATIVE 07/21/2009 1028   LEUKOCYTESUR TRACE (A) 07/21/2009 1028    Radiological Exams on Admission: Dg Chest 2 View  Result Date: 12/23/2016 CLINICAL DATA:  76 year old male with chest pain and shortness of breath. EXAM: CHEST  2 VIEW COMPARISON:  Chest radiograph dated 06/03/2015 FINDINGS: The lungs are clear. There is no pleural effusion or pneumothorax. The cardiac silhouette is within normal limits. There is atherosclerotic calcification of the aortic arch. There is degenerative changes of the spine. There is a left shoulder arthroplasty. No acute osseous pathology. IMPRESSION: No active cardiopulmonary disease. Electronically Signed   By: Anner Crete M.D.   On: 12/23/2016 22:19    EKG: Independently reviewed.  Assessment/Plan Principal Problem:   Chest pain, rule out acute myocardial infarction Active Problems:   DM2 (diabetes mellitus, type 2) (HCC)   HTN (hypertension)    1. CP ruleout - 1. HEART score is 6 2. CP obs pathway 3. Serial trops 4. Tele monitor 5. NPO 6. Heparin gtt 7. Will do NTG paste since this got him to a 1/10  pain with okay blood pressure but the gtt ended up dropping blood pressure. 8. Got ASA 9. Cards eval in AM 2. DM2 - 1. Hold home long acting 2. Sensitive SSI Q4H while NPO 3. HTN - 1. Continue norvasc 2. Looks like he ran out of his lisinopril-hctz, will hold this for the moment since I anticipate NTG will lower BP for now.  DVT prophylaxis: Heparin gtt Code Status: Full Family Communication: Wife at bedside Disposition Plan: Home after admit Consults called: Left message with P.Trent for cards eval in AM Admission status: Place in [place], [facility] Pager [phone]  If 7AM-7PM, please contact day team taking care of patient [email or web address] Password [email or web address]  12/24/2016, 1:31 AM

## 2016-12-24 NOTE — ED Notes (Signed)
No respiratory or acute distress noted alert and oriented x 3 call light in reach no reaction to medication noted family at bedside. 

## 2016-12-25 ENCOUNTER — Other Ambulatory Visit (HOSPITAL_COMMUNITY): Payer: Medicare Other

## 2016-12-25 ENCOUNTER — Encounter (HOSPITAL_COMMUNITY)
Admission: EM | Disposition: A | Discharge status: Home / Self Care | Payer: Self-pay | Source: Home / Self Care | Attending: Internal Medicine

## 2016-12-25 HISTORY — PX: CORONARY STENT INTERVENTION: CATH118234

## 2016-12-25 HISTORY — PX: LEFT HEART CATH AND CORONARY ANGIOGRAPHY: CATH118249

## 2016-12-25 LAB — CBC
HCT: 37.5 % — ABNORMAL LOW (ref 39.0–52.0)
Hemoglobin: 12.6 g/dL — ABNORMAL LOW (ref 13.0–17.0)
MCH: 30.2 pg (ref 26.0–34.0)
MCHC: 33.6 g/dL (ref 30.0–36.0)
MCV: 89.9 fL (ref 78.0–100.0)
Platelets: 202 10*3/uL (ref 150–400)
RBC: 4.17 MIL/uL — ABNORMAL LOW (ref 4.22–5.81)
RDW: 12.6 % (ref 11.5–15.5)
WBC: 6 10*3/uL (ref 4.0–10.5)

## 2016-12-25 LAB — BASIC METABOLIC PANEL
Anion gap: 8 (ref 5–15)
BUN: 18 mg/dL (ref 6–20)
CO2: 25 mmol/L (ref 22–32)
Calcium: 8.8 mg/dL — ABNORMAL LOW (ref 8.9–10.3)
Chloride: 106 mmol/L (ref 101–111)
Creatinine, Ser: 1.05 mg/dL (ref 0.61–1.24)
GFR calc Af Amer: >60 mL/min (ref >60)
GFR calc non Af Amer: >60 mL/min (ref >60)
Glucose, Bld: 117 mg/dL — ABNORMAL HIGH (ref 65–99)
Potassium: 3.6 mmol/L (ref 3.5–5.1)
Sodium: 139 mmol/L (ref 135–145)

## 2016-12-25 LAB — POCT ACTIVATED CLOTTING TIME
Activated Clotting Time: 257 seconds
Activated Clotting Time: 472 seconds
Activated Clotting Time: 577 seconds

## 2016-12-25 LAB — GLUCOSE, CAPILLARY
Glucose-Capillary: 104 mg/dL — ABNORMAL HIGH (ref 65–99)
Glucose-Capillary: 109 mg/dL — ABNORMAL HIGH (ref 65–99)
Glucose-Capillary: 125 mg/dL — ABNORMAL HIGH (ref 65–99)
Glucose-Capillary: 252 mg/dL — ABNORMAL HIGH (ref 65–99)
Glucose-Capillary: 95 mg/dL (ref 65–99)
Glucose-Capillary: 126 mg/dL — ABNORMAL HIGH (ref 65–99)
Glucose-Capillary: 219 mg/dL — ABNORMAL HIGH (ref 65–99)

## 2016-12-25 LAB — PROTIME-INR
INR: 1.05
Prothrombin Time: 13.6 seconds (ref 11.4–15.2)

## 2016-12-25 LAB — HEPARIN LEVEL (UNFRACTIONATED): Heparin Unfractionated: 0.36 IU/mL (ref 0.30–0.70)

## 2016-12-25 LAB — TROPONIN I: Troponin I: 0.28 ng/mL (ref <0.03)

## 2016-12-25 SURGERY — LEFT HEART CATH AND CORONARY ANGIOGRAPHY
Anesthesia: LOCAL

## 2016-12-25 MED ORDER — HEPARIN (PORCINE) IN NACL 2-0.9 UNIT/ML-% IJ SOLN
INTRAMUSCULAR | Status: AC
  Filled 2016-12-25: qty 1000

## 2016-12-25 MED ORDER — IOPAMIDOL (ISOVUE-370) INJECTION 76%
INTRAVENOUS | Status: AC
  Filled 2016-12-25: qty 50

## 2016-12-25 MED ORDER — HEPARIN SODIUM (PORCINE) 1000 UNIT/ML IJ SOLN
INTRAMUSCULAR | Status: AC
  Filled 2016-12-25: qty 1

## 2016-12-25 MED ORDER — HEPARIN SODIUM (PORCINE) 1000 UNIT/ML IJ SOLN
INTRAMUSCULAR | Status: DC | PRN
  Administered 2016-12-25: 6000 [IU] via INTRAVENOUS
  Administered 2016-12-25: 3000 [IU] via INTRAVENOUS
  Administered 2016-12-25 (×2): 2000 [IU] via INTRAVENOUS

## 2016-12-25 MED ORDER — LIDOCAINE HCL (PF) 1 % IJ SOLN
INTRAMUSCULAR | Status: DC | PRN
  Administered 2016-12-25: 2 mL

## 2016-12-25 MED ORDER — SODIUM CHLORIDE 0.9 % IV SOLN: INTRAVENOUS | Status: AC

## 2016-12-25 MED ORDER — HEPARIN (PORCINE) IN NACL 2-0.9 UNIT/ML-% IJ SOLN
INTRAMUSCULAR | Status: AC | PRN
  Administered 2016-12-25: 500 mL

## 2016-12-25 MED ORDER — HYDRALAZINE HCL 20 MG/ML IJ SOLN
5.0000 mg | INTRAMUSCULAR | Status: AC | PRN
  Administered 2016-12-25 (×3): 5 mg via INTRAVENOUS
  Filled 2016-12-25 (×2): qty 1

## 2016-12-25 MED ORDER — LABETALOL HCL 5 MG/ML IV SOLN
10.0000 mg | INTRAVENOUS | Status: AC | PRN
  Administered 2016-12-25: 19:00:00 10 mg via INTRAVENOUS
  Filled 2016-12-25: qty 4

## 2016-12-25 MED ORDER — VERAPAMIL HCL 2.5 MG/ML IV SOLN
INTRAVENOUS | Status: AC
  Filled 2016-12-25: qty 2

## 2016-12-25 MED ORDER — ALPRAZOLAM 0.25 MG PO TABS
0.2500 mg | ORAL_TABLET | Freq: Every day | ORAL | Status: DC | PRN
  Administered 2016-12-25: 20:00:00 0.25 mg via ORAL
  Filled 2016-12-25: qty 1

## 2016-12-25 MED ORDER — MIDAZOLAM HCL 2 MG/2ML IJ SOLN
INTRAMUSCULAR | Status: AC
  Filled 2016-12-25: qty 2

## 2016-12-25 MED ORDER — NITROGLYCERIN 0.4 MG SL SUBL
SUBLINGUAL_TABLET | SUBLINGUAL | Status: AC
  Administered 2016-12-25: 0.4 mg via SUBLINGUAL
  Filled 2016-12-25: qty 1

## 2016-12-25 MED ORDER — MIDAZOLAM HCL 2 MG/2ML IJ SOLN
INTRAMUSCULAR | Status: DC | PRN
  Administered 2016-12-25 (×2): 1 mg via INTRAVENOUS

## 2016-12-25 MED ORDER — IOPAMIDOL (ISOVUE-370) INJECTION 76%
INTRAVENOUS | Status: AC
  Filled 2016-12-25: qty 100

## 2016-12-25 MED ORDER — ANGIOPLASTY BOOK
Freq: Once | Status: AC
  Administered 2016-12-26: 1
  Filled 2016-12-25: qty 1

## 2016-12-25 MED ORDER — NITROGLYCERIN 1 MG/10 ML FOR IR/CATH LAB
INTRA_ARTERIAL | Status: AC
  Filled 2016-12-25: qty 10

## 2016-12-25 MED ORDER — FENTANYL CITRATE (PF) 100 MCG/2ML IJ SOLN
INTRAMUSCULAR | Status: DC | PRN
  Administered 2016-12-25: 25 ug via INTRAVENOUS
  Administered 2016-12-25: 50 ug via INTRAVENOUS
  Administered 2016-12-25: 25 ug via INTRAVENOUS

## 2016-12-25 MED ORDER — FENTANYL CITRATE (PF) 100 MCG/2ML IJ SOLN
INTRAMUSCULAR | Status: AC
  Filled 2016-12-25: qty 2

## 2016-12-25 MED ORDER — SODIUM CHLORIDE 0.9 % IV SOLN: 250.0000 mL | INTRAVENOUS | Status: DC | PRN

## 2016-12-25 MED ORDER — FENTANYL CITRATE (PF) 100 MCG/2ML IJ SOLN
25.0000 ug | Freq: Once | INTRAMUSCULAR | Status: AC
  Administered 2016-12-25: 25 ug via INTRAVENOUS

## 2016-12-25 MED ORDER — SODIUM CHLORIDE 0.9% FLUSH
3.0000 mL | Freq: Two times a day (BID) | INTRAVENOUS | Status: DC
  Administered 2016-12-26: via INTRAVENOUS

## 2016-12-25 MED ORDER — TICAGRELOR 90 MG PO TABS
90.0000 mg | ORAL_TABLET | Freq: Two times a day (BID) | ORAL | Status: DC
  Administered 2016-12-26 – 2016-12-27 (×3): 90 mg via ORAL
  Filled 2016-12-25 (×3): qty 1

## 2016-12-25 MED ORDER — HEPARIN (PORCINE) IN NACL 100-0.45 UNIT/ML-% IJ SOLN
1000.0000 [IU]/h | INTRAMUSCULAR | Status: DC
  Administered 2016-12-25: 1000 [IU]/h via INTRAVENOUS
  Filled 2016-12-25: qty 250

## 2016-12-25 MED ORDER — NITROGLYCERIN 0.4 MG SL SUBL
0.4000 mg | SUBLINGUAL_TABLET | SUBLINGUAL | Status: DC | PRN
  Administered 2016-12-25 (×2): 0.4 mg via SUBLINGUAL
  Filled 2016-12-25: qty 1

## 2016-12-25 MED ORDER — HEPARIN (PORCINE) IN NACL 2-0.9 UNIT/ML-% IJ SOLN
INTRAMUSCULAR | Status: DC | PRN
Start: 2016-12-25 — End: 2016-12-25
  Administered 2016-12-25: 1000 mL

## 2016-12-25 MED ORDER — TICAGRELOR 90 MG PO TABS
ORAL_TABLET | ORAL | Status: AC
  Filled 2016-12-25: qty 2

## 2016-12-25 MED ORDER — IOPAMIDOL (ISOVUE-370) INJECTION 76%
INTRAVENOUS | Status: DC | PRN
  Administered 2016-12-25: 210 mL via INTRAVENOUS

## 2016-12-25 MED ORDER — NITROGLYCERIN 1 MG/10 ML FOR IR/CATH LAB
INTRA_ARTERIAL | Status: DC | PRN
  Administered 2016-12-25 (×2): 200 ug via INTRACORONARY

## 2016-12-25 MED ORDER — TICAGRELOR 90 MG PO TABS
ORAL_TABLET | ORAL | Status: DC | PRN
  Administered 2016-12-25: 180 mg via ORAL

## 2016-12-25 MED ORDER — FENTANYL CITRATE (PF) 100 MCG/2ML IJ SOLN
25.0000 ug | INTRAMUSCULAR | Status: DC | PRN
  Administered 2016-12-25: 20:00:00 25 ug via INTRAVENOUS
  Filled 2016-12-25: qty 2

## 2016-12-25 MED ORDER — VERAPAMIL HCL 2.5 MG/ML IV SOLN
INTRAVENOUS | Status: DC | PRN
  Administered 2016-12-25: 10 mL via INTRA_ARTERIAL

## 2016-12-25 MED ORDER — LIDOCAINE HCL (PF) 1 % IJ SOLN
INTRAMUSCULAR | Status: AC
  Filled 2016-12-25: qty 30

## 2016-12-25 MED ORDER — SODIUM CHLORIDE 0.9% FLUSH: 3.0000 mL | INTRAVENOUS | Status: DC | PRN

## 2016-12-25 SURGICAL SUPPLY — 23 items
BALLN SAPPHIRE 2.5X15 (BALLOONS) ×2
BALLN SAPPHIRE ~~LOC~~ 2.5X15 (BALLOONS) ×2 IMPLANT
BALLN SAPPHIRE ~~LOC~~ 3.0X12 (BALLOONS) ×2 IMPLANT
BALLN SAPPHIRE ~~LOC~~ 3.5X15 (BALLOONS) ×2 IMPLANT
BALLOON SAPPHIRE 2.5X15 (BALLOONS) ×1 IMPLANT
CATH 5FR JL3.5 JR4 ANG PIG MP (CATHETERS) ×2 IMPLANT
CATH LAUNCHER 6FR AL.75 (CATHETERS) ×2 IMPLANT
CATH VISTA GUIDE 6FR XBLAD3.5 (CATHETERS) ×2 IMPLANT
DEVICE RAD COMP TR BAND LRG (VASCULAR PRODUCTS) ×2 IMPLANT
GLIDESHEATH SLEND SS 6F .021 (SHEATH) ×4 IMPLANT
GUIDELINER 6F (CATHETERS) ×2 IMPLANT
GUIDEWIRE INQWIRE 1.5J.035X260 (WIRE) ×1 IMPLANT
INQWIRE 1.5J .035X260CM (WIRE) ×2
KIT ENCORE 26 ADVANTAGE (KITS) ×2 IMPLANT
KIT HEART LEFT (KITS) ×2 IMPLANT
PACK CARDIAC CATHETERIZATION (CUSTOM PROCEDURE TRAY) ×2 IMPLANT
STENT SIERRA 2.75 X 18 MM (Permanent Stent) ×2 IMPLANT
STENT SIERRA 3.00 X 15 MM (Permanent Stent) ×2 IMPLANT
STENT SIERRA 3.00 X 23 MM (Permanent Stent) ×2 IMPLANT
SYR MEDRAD MARK V 150ML (SYRINGE) ×2 IMPLANT
TRANSDUCER W/STOPCOCK (MISCELLANEOUS) ×2 IMPLANT
TUBING CIL FLEX 10 FLL-RA (TUBING) ×2 IMPLANT
WIRE COUGAR XT STRL 190CM (WIRE) ×2 IMPLANT

## 2016-12-25 NOTE — Progress Notes (Addendum)
Patient complained of severe chest and right arm pain Blood pressure and heart rate elevated. Given labetalol 10 mg IV at 1925 and nitroglycerin SL without relief. Given fentanyl at 1930 25 mcg. Patient reported feeling better and heart rate and blood pressure decreased. Discussed with patient and family about need to keep calm to prevent release of stress hormones that will elevate heart rate and blood pressure. The increase of vital signs increases the work of the heart which is creating the chest pain. Patient told his wife that when she is anxious it stresses him out because he is worried about her. Plan to keep calm tonight and revascularize obstructed vessel tomorrow was agreed upon by patient and family. Patient has no complaints of pain at 1950. Reported to [employer] PA.

## 2016-12-25 NOTE — Progress Notes (Signed)
Pt leaving floor to cath lab via bed.

## 2016-12-25 NOTE — Progress Notes (Signed)
TR BAND REMOVAL  LOCATION:    right radial  DEFLATED PER PROTOCOL:    Yes.    TIME BAND OFF / DRESSING APPLIED:   1930  SITE UPON ARRIVAL:    Level 0  SITE AFTER BAND REMOVAL:    Level 0  CIRCULATION SENSATION AND MOVEMENT:    Within Normal Limits   Yes.    COMMENTS:   Rechecked with no change in assessment

## 2016-12-25 NOTE — H&P (View-Only) (Signed)
    Labs reviewed and stable. Troponin 1.76, remains on IV heparin. On the add on board for cath today.   SignedReino Bellis, NP-C 12/25/2016, 8:25 AM Pager: [phone]

## 2016-12-25 NOTE — Progress Notes (Addendum)
CTSP regarding recurrent right elbow/arm aching and chest pain similar to presenting angina. Wife reports this began shortly after he was struggling to get situated with the urinal at bedside, standing up. EKG shows NSR with continued RBBB, challenging to assess for ischemia given underlying conduction abnormality. He was hypertensive up to 180s. No SOB, + somewhat anxious. 2 SL NTG given - 1st with no relief, 2nd one with gradual relief. 25mg  IV fentanyl given with subsequent relief of pain. He now feels similar to when he left the lab with a general awareness in his chest but no specific pain. BP initially fell with administration of the above but quickly recovered to 157/92, manual recheck similar. D/w Dr. Domenic Polite. Will plan to resume heparin 8 hours post-sheath pull. Will check troponin now and in AM to trend. Will avoid IV NTG for now given patient/wife concerns that it previously dropped his pressure, but monitor closely for any recurrent pain. Recommend to follow BP trajectory over the next hour or so given acute anxiety over this event, as prior values were normotensive. He also has PRN hydralazine ordered per post-cath orders. Dayna Dunn PA-C   Addendum 7:54 PM  Nurse reports pt had episode of spiking BP and HR. Labetalol and NTG given per nursing protocol without relief. BP remained high, thus IV fentanyl ordered. 1 dose given, BP decreased but came back up quickly, and patient's chest pain resolved. Nurse believes patient is getting anxious which is in turn spiking vitals and causing symptoms. Per d/w MD Dr. Domenic Polite, will also try Xanax. Signed out to fellow to be aware of patient as well.  Dayna Dunn PA-C

## 2016-12-25 NOTE — Progress Notes (Signed)
    Labs reviewed and stable. Troponin 1.76, remains on IV heparin. On the add on board for cath today.   SignedReino Bellis, NP-C 12/25/2016, 8:25 AM Pager: [phone]

## 2016-12-25 NOTE — Interval H&P Note (Signed)
History and Physical Interval Note:  12/25/2016 1:30 PM  Matthew [name]  has presented today for cardiac cath with the diagnosis of NSTEMI. The various methods of treatment have been discussed with the patient and family. After consideration of risks, benefits and other options for treatment, the patient has consented to  Procedure(s): LEFT HEART CATH AND CORONARY ANGIOGRAPHY (N/A) as a surgical intervention .  The patient's history has been reviewed, patient examined, no change in status, stable for surgery.  I have reviewed the patient's chart and labs.  Questions were answered to the patient's satisfaction.    Cath Lab Visit (complete for each Cath Lab visit)  Clinical Evaluation Leading to the Procedure:   ACS: Yes.    Non-ACS:    Anginal Classification: CCS III  Anti-ischemic medical therapy: Minimal Therapy (1 class of medications)  Non-Invasive Test Results: No non-invasive testing performed  Prior CABG: No previous CABG         Lauree Chandler

## 2016-12-25 NOTE — Progress Notes (Signed)
Patient complained of right elbow pain medial 7-8/10 radiating down to wrist. Pain is same as when I had a heart attack. [place] to [employer] PA. Give sl nitroglycerin 2 doses 5 minutes apart then call back. She has seen the ECG  1818 pain increasing in chest has increased from pressure to 7-8/10 midsternal. No change in pain after first itroglycerin sl given at [id number] Melina Copa PA into see patient. Patient stated that the pain is lessening. 1840 slight pressure in chest and pressure in elbow. Stated it got up to 8-9 in both sites at its worst. Resting comfortable now.

## 2016-12-25 NOTE — Progress Notes (Signed)
Progress Note    Matthew [name]  BDZ:[id number] DOB: [birth year]  DOA: 12/23/2016 PCP: Wenda Low, MD    Brief Narrative:   Chief complaint: Follow-up chest pain  Medical records reviewed and are as summarized below:  Matthew [name] is an 75 y.o. male with PMH of type 2 diabetes, hypertension who was admitted 12/23/16 for evaluation of chest pain/pressure improved with nitroglycerin and aspirin. Troponin elevation noted during initial workup.  Assessment/Plan:   Principal Problem:   Chest pain, rule out acute myocardial infarction Continue aspirin and heparin drip. Troponin bump from 0.27---> 1.76. Ischemic changes noted on EKG. Continue atorvastatin, metoprolol and Nitroglycerin when necessary chest pain. 2-D echo ordered, but no results available. Cardiac catheterization scheduled for today.  Active Problems:   DM2 (diabetes mellitus, type 2) (HCC) Continue insulin sensitive SSI every 4 hours. Currently nothing by mouth until evaluation by cardiology. Follow-up hemoglobin A1c.    HTN (hypertension) Currently being managed with Norvasc.    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 Unknown baseline.  Body mass index is 27.04 kg/m.   Family Communication/Anticipated D/C date and plan/Code Status   DVT prophylaxis: Heparin ordered. Code Status: Full Code.  Family Communication: Family updated at bedside. Disposition Plan: Home after cardiac catheterization if clear.   Medical Consultants:    Cardiology   Anti-Infectives:    None  Subjective:   No current complaints of chest pain, shortness of breath, dizziness. No diaphoresis or nausea.  Objective:    Vitals:   12/24/16 1945 12/24/16 2347 12/25/16 0039 12/25/16 0404  BP: 132/82 101/67 100/65 129/70  Pulse: 62  (!) 58 63  Resp: 18  14 16   Temp: 98.1 F (36.7 C)  (!) 97.4 F (36.3 C) 98.2 F (36.8 C)  TempSrc: Oral  Axillary Oral  SpO2: 98%  95%     Weight:    83.1 kg (183 lb 1.6 oz)  Height:        Intake/Output Summary (Last 24 hours) at 12/25/2016 0816 Last data filed at 12/25/2016 0400 Gross per 24 hour  Intake 614.65 ml  Output 2 ml  Net 612.65 ml   Filed Weights   12/23/16 2202 12/24/16 0843 12/25/16 0404  Weight: 80.3 kg (177 lb) 83.5 kg (184 lb) 83.1 kg (183 lb 1.6 oz)    Exam: General: No acute distress. Cardiovascular: Heart sounds show a regular rate, and rhythm. No gallops or rubs. No murmurs. No JVD. Lungs: Clear to auscultation bilaterally with good air movement. No rales, rhonchi or wheezes. Abdomen: Soft, nontender, nondistended with normal active bowel sounds. No masses. No hepatosplenomegaly. Skin: Warm and dry. No rashes or lesions. Extremities: No clubbing or cyanosis. No edema. Pedal pulses 2+.  Data Reviewed:   I have personally reviewed following labs and imaging studies:  Labs: Labs show the following:   Basic Metabolic Panel: Recent Labs  Lab 12/23/16 2201 12/25/16 0357  NA 136 139  K 4.0 3.6  CL 100* 106  CO2 24 25  GLUCOSE 288* 117*  BUN 31* 18  CREATININE 1.44* 1.05  CALCIUM 9.4 8.8*   GFR Estimated Creatinine Clearance: 61.7 mL/min (by C-G formula based on SCr of 1.05 mg/dL). Coagulation profile Recent Labs  Lab 12/24/16 0058 12/25/16 0357  INR 0.95 1.05    CBC: Recent Labs  Lab 12/23/16 2201 12/25/16 0357  WBC 7.8 6.0  HGB 15.6 12.6*  HCT 44.0 37.5*  MCV 89.4 89.9  PLT 237 202  Cardiac Enzymes: Recent Labs  Lab 12/24/16 0100 12/24/16 0634  TROPONINI 0.27* 1.76*   CBG: Recent Labs  Lab 12/24/16 2027 12/24/16 2147 12/25/16 0406 12/25/16 0623 12/25/16 0749  GLUCAP 139* 225* 104* 109* 125*   Microbiology No results found for this or any previous visit (from the past 240 hour(s)).  Procedures and diagnostic studies:  12/23/16 Dg Chest 2 View: My independent review of the image shows: Clear lungs. Normal heart size. No infiltrates.  Medications:    . aspirin  81 mg Oral Daily  . atorvastatin  80 mg Oral q1800  . cycloSPORINE  1 drop Both Eyes BID  . insulin aspart  0-9 Units Subcutaneous Q4H  . metoprolol succinate  12.5 mg Oral Daily  . nitroGLYCERIN  1 inch Topical Q6H  . sodium chloride flush  3 mL Intravenous Q12H   Continuous Infusions: . sodium chloride Stopped (12/25/16 5883)  . sodium chloride 1 mL/kg/hr (12/25/16 0709)  . heparin 1,000 Units/hr (12/24/16 2003)     LOS: 1 day   Jacquelynn Cree  [facility] Pager [phone]. If unable to reach me by pager, please call my cell phone at [phone].  *Please refer to [email or web address], password [email or web address] to get updated schedule on who will round on this patient, as hospitalists switch teams weekly. If 7PM-7AM, please contact night-coverage at [email or web address], password [email or web address] for any overnight needs.  12/25/2016, 8:16 AM

## 2016-12-25 NOTE — Progress Notes (Signed)
[place] for Heparin Indication: chest pain/ACS  Allergies  Allergen Reactions  . Oxycodone Itching  . Codeine Itching  . Morphine And Related Itching    Patient Measurements: Height: 5\' 9"  (175.3 cm) Weight: 183 lb 1.6 oz (83.1 kg) IBW/kg (Calculated) : 70.7 Heparin Dosing Weight: 83.4  Vital Signs: Temp: 98 F (36.7 C) (11/19 0834) Temp Source: Oral (11/19 0834) BP: 126/72 (11/19 0834) Pulse Rate: 64 (11/19 0834)  Labs: Recent Labs    12/23/16 2201 12/24/16 0058 12/24/16 0100 12/24/16 0634 12/24/16 1031 12/24/16 2051 12/25/16 0357  HGB 15.6  --   --   --   --   --  12.6*  HCT 44.0  --   --   --   --   --  37.5*  PLT 237  --   --   --   --   --  202  APTT  --  28  --   --   --   --   --   LABPROT  --  12.6  --   --   --   --  13.6  INR  --  0.95  --   --   --   --  1.05  HEPARINUNFRC  --   --   --   --  0.38 0.42 0.36  CREATININE 1.44*  --   --   --   --   --  1.05  TROPONINI  --   --  0.27* 1.76*  --   --   --     Estimated Creatinine Clearance: 61.7 mL/min (by C-G formula based on SCr of 1.05 mg/dL).   Medical History: Past Medical History:  Diagnosis Date  . Arthritis    Shoulder, knees, back  . 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 Cataract    right  . Chronic kidney disease    Stage II, per Dr. 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 Complication of anesthesia    "Too much with shoulder surgery", pt. reports that he was told the at they "lost him, due to absorbing too much anesthesia". Not enough for  Right ,  Left knee  just right.  . Constipation    occ  . Diabetes mellitus    type 2 diet controlled  . Dysrhythmia    has been irregular at times since age 14.  Marland Kitchen History of kidney stones   . Hypertension    treated/managed by Dr. Lysle Rubens, [facility]   . Kidney stones 2011   multiple times.   Assessment: 76 yo M PMHx HTN, HLD, DM2 who presented to [facility] with chest pain, NSTEMI. Started on heparin drip at [facility].  Pharmacy consulted for heparin at [facility]. Plans noted cath today  -heparin level remains at goal on 1000 units/hr  Goal of Therapy:  Heparin level 0.3-0.7 units/ml Monitor platelets by anticoagulation protocol: Yes   Plan:  Continue heparin drip at 1000 units/hr Daily heparin level, CBC Will follow plans post cath  Hildred Laser, Pharm D 12/25/2016 10:41 AM

## 2016-12-25 NOTE — Progress Notes (Signed)
[place] for Heparin Indication: chest pain/ACS  Allergies  Allergen Reactions  . Oxycodone Itching  . Codeine Itching  . Morphine And Related Itching    Patient Measurements: Height: 5\' 9"  (175.3 cm) Weight: 183 lb 1.6 oz (83.1 kg) IBW/kg (Calculated) : 70.7 Heparin Dosing Weight: 83.4  Vital Signs: Temp: 98.3 F (36.8 C) (11/19 1523) Temp Source: Oral (11/19 1523) BP: 157/92 (11/19 1845) Pulse Rate: 90 (11/19 1845)  Labs: Recent Labs    12/23/16 2201 12/24/16 0058 12/24/16 0100 12/24/16 0634 12/24/16 1031 12/24/16 2051 12/25/16 0357  HGB 15.6  --   --   --   --   --  12.6*  HCT 44.0  --   --   --   --   --  37.5*  PLT 237  --   --   --   --   --  202  APTT  --  28  --   --   --   --   --   LABPROT  --  12.6  --   --   --   --  13.6  INR  --  0.95  --   --   --   --  1.05  HEPARINUNFRC  --   --   --   --  0.38 0.42 0.36  CREATININE 1.44*  --   --   --   --   --  1.05  TROPONINI  --   --  0.27* 1.76*  --   --   --     Estimated Creatinine Clearance: 61.7 mL/min (by C-G formula based on SCr of 1.05 mg/dL).   Medical History: Past Medical History:  Diagnosis Date  . Arthritis    Shoulder, knees, back  . 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 Cataract    right  . Chronic kidney disease    Stage II, per Dr. 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 Complication of anesthesia    "Too much with shoulder surgery", pt. reports that he was told the at they "lost him, due to absorbing too much anesthesia". Not enough for  Right ,  Left knee  just right.  . Constipation    occ  . Diabetes mellitus    type 2 diet controlled  . Dysrhythmia    has been irregular at times since age 75.  Marland Kitchen History of kidney stones   . Hypertension    treated/managed by Dr. Lysle Rubens, [facility]   . Kidney stones 2011   multiple times.   Assessment: 76 yo M PMHx HTN, HLD, DM2 who presented to [facility] with chest pain, NSTEMI. Started on heparin drip at [facility].  Pharmacy consulted for heparin at [facility]. Plans noted cath today s/p PCI LAD and planned PCI to RCA in am with CP after - restart heparin 8hr after sheath pulled  Sheath out 1500 restart heparin at 2300  Goal of Therapy:  Heparin level 0.3-0.7 units/ml Monitor platelets by anticoagulation protocol: Yes   Plan:  Restart heparin drip at 1000 units/hr Daily heparin level, CBC Will follow plans post cath  [facility] Pharm.D. CPP, BCPS Clinical Pharmacist [phone] 12/25/2016 8:01 PM

## 2016-12-26 ENCOUNTER — Encounter (HOSPITAL_COMMUNITY): Payer: Self-pay | Admitting: Cardiovascular Disease

## 2016-12-26 ENCOUNTER — Encounter (HOSPITAL_COMMUNITY)
Admission: EM | Disposition: A | Discharge status: Home / Self Care | Payer: Self-pay | Source: Home / Self Care | Attending: Internal Medicine

## 2016-12-26 ENCOUNTER — Inpatient Hospital Stay (HOSPITAL_COMMUNITY): Payer: Medicare Other

## 2016-12-26 DIAGNOSIS — I361 Nonrheumatic tricuspid (valve) insufficiency: Secondary | ICD-10-CM

## 2016-12-26 HISTORY — PX: CORONARY STENT INTERVENTION: CATH118234

## 2016-12-26 HISTORY — PX: CORONARY ANGIOGRAPHY: CATH118303

## 2016-12-26 LAB — HEMOGLOBIN A1C
Hgb A1c MFr Bld: 11.3 % — ABNORMAL HIGH (ref 4.8–5.6)
Mean Plasma Glucose: 277.61 mg/dL

## 2016-12-26 LAB — ECHOCARDIOGRAM COMPLETE
Height: 69 in
Weight: 2931.24 oz

## 2016-12-26 LAB — POCT ACTIVATED CLOTTING TIME: Activated Clotting Time: 433 seconds

## 2016-12-26 LAB — GLUCOSE, CAPILLARY
Glucose-Capillary: 174 mg/dL — ABNORMAL HIGH (ref 65–99)
Glucose-Capillary: 111 mg/dL — ABNORMAL HIGH (ref 65–99)
Glucose-Capillary: 128 mg/dL — ABNORMAL HIGH (ref 65–99)
Glucose-Capillary: 220 mg/dL — ABNORMAL HIGH (ref 65–99)
Glucose-Capillary: 91 mg/dL (ref 65–99)

## 2016-12-26 LAB — BASIC METABOLIC PANEL
Anion gap: 7 (ref 5–15)
BUN: 15 mg/dL (ref 6–20)
CO2: 25 mmol/L (ref 22–32)
Calcium: 8.6 mg/dL — ABNORMAL LOW (ref 8.9–10.3)
Chloride: 105 mmol/L (ref 101–111)
Creatinine, Ser: 1.01 mg/dL (ref 0.61–1.24)
GFR calc Af Amer: >60 mL/min (ref >60)
GFR calc non Af Amer: >60 mL/min (ref >60)
Glucose, Bld: 142 mg/dL — ABNORMAL HIGH (ref 65–99)
Potassium: 3.3 mmol/L — ABNORMAL LOW (ref 3.5–5.1)
Sodium: 137 mmol/L (ref 135–145)

## 2016-12-26 LAB — HEPARIN LEVEL (UNFRACTIONATED)
Heparin Unfractionated: 0.2 IU/mL — ABNORMAL LOW (ref 0.30–0.70)
Heparin Unfractionated: 0.34 IU/mL (ref 0.30–0.70)

## 2016-12-26 LAB — CBC
HCT: 37.3 % — ABNORMAL LOW (ref 39.0–52.0)
Hemoglobin: 12.7 g/dL — ABNORMAL LOW (ref 13.0–17.0)
MCH: 30.5 pg (ref 26.0–34.0)
MCHC: 34 g/dL (ref 30.0–36.0)
MCV: 89.7 fL (ref 78.0–100.0)
Platelets: 179 10*3/uL (ref 150–400)
RBC: 4.16 MIL/uL — ABNORMAL LOW (ref 4.22–5.81)
RDW: 12.9 % (ref 11.5–15.5)
WBC: 7.2 10*3/uL (ref 4.0–10.5)

## 2016-12-26 LAB — TROPONIN I: Troponin I: 0.71 ng/mL (ref <0.03)

## 2016-12-26 SURGERY — CORONARY STENT INTERVENTION
Anesthesia: LOCAL

## 2016-12-26 MED ORDER — INSULIN ASPART 100 UNIT/ML ~~LOC~~ SOLN: 0.0000 [IU] | Freq: Every day | SUBCUTANEOUS | Status: DC

## 2016-12-26 MED ORDER — ASPIRIN 81 MG PO CHEW
81.0000 mg | CHEWABLE_TABLET | ORAL | Status: AC
  Administered 2016-12-26: 09:00:00 81 mg via ORAL
  Filled 2016-12-26: qty 1

## 2016-12-26 MED ORDER — BIVALIRUDIN BOLUS VIA INFUSION - CUPID
INTRAVENOUS | Status: DC | PRN
  Administered 2016-12-26: 62.325 mg via INTRAVENOUS

## 2016-12-26 MED ORDER — LIDOCAINE HCL (PF) 1 % IJ SOLN
INTRAMUSCULAR | Status: AC
  Filled 2016-12-26: qty 30

## 2016-12-26 MED ORDER — IOPAMIDOL (ISOVUE-370) INJECTION 76%
INTRAVENOUS | Status: AC
  Filled 2016-12-26: qty 125

## 2016-12-26 MED ORDER — SODIUM CHLORIDE 0.9 % IV SOLN
INTRAVENOUS | Status: AC
  Administered 2016-12-26: 18:00:00 via INTRAVENOUS

## 2016-12-26 MED ORDER — LABETALOL HCL 5 MG/ML IV SOLN: 10.0000 mg | INTRAVENOUS | Status: AC | PRN

## 2016-12-26 MED ORDER — SODIUM CHLORIDE 0.9 % WEIGHT BASED INFUSION: 3.0000 mL/kg/h | INTRAVENOUS | Status: DC

## 2016-12-26 MED ORDER — ONDANSETRON HCL 4 MG/2ML IJ SOLN: 4.0000 mg | Freq: Four times a day (QID) | INTRAMUSCULAR | Status: DC | PRN

## 2016-12-26 MED ORDER — MIDAZOLAM HCL 2 MG/2ML IJ SOLN
INTRAMUSCULAR | Status: DC | PRN
  Administered 2016-12-26: 2 mg via INTRAVENOUS

## 2016-12-26 MED ORDER — HYDRALAZINE HCL 20 MG/ML IJ SOLN: 5.0000 mg | INTRAMUSCULAR | Status: AC | PRN

## 2016-12-26 MED ORDER — HEPARIN SODIUM (PORCINE) 1000 UNIT/ML IJ SOLN
INTRAMUSCULAR | Status: AC
  Filled 2016-12-26: qty 1

## 2016-12-26 MED ORDER — MIDAZOLAM HCL 2 MG/2ML IJ SOLN
INTRAMUSCULAR | Status: AC
  Filled 2016-12-26: qty 2

## 2016-12-26 MED ORDER — POTASSIUM CHLORIDE CRYS ER 20 MEQ PO TBCR
40.0000 meq | EXTENDED_RELEASE_TABLET | Freq: Once | ORAL | Status: AC
  Administered 2016-12-26: 09:00:00 40 meq via ORAL
  Filled 2016-12-26: qty 2

## 2016-12-26 MED ORDER — NITROGLYCERIN 1 MG/10 ML FOR IR/CATH LAB
INTRA_ARTERIAL | Status: AC
  Filled 2016-12-26: qty 10

## 2016-12-26 MED ORDER — SODIUM CHLORIDE 0.9% FLUSH: 3.0000 mL | INTRAVENOUS | Status: DC | PRN

## 2016-12-26 MED ORDER — SODIUM CHLORIDE 0.9% FLUSH: 3.0000 mL | Freq: Two times a day (BID) | INTRAVENOUS | Status: DC

## 2016-12-26 MED ORDER — BIVALIRUDIN TRIFLUOROACETATE 250 MG IV SOLR
INTRAVENOUS | Status: AC
  Filled 2016-12-26: qty 250

## 2016-12-26 MED ORDER — HEPARIN (PORCINE) IN NACL 2-0.9 UNIT/ML-% IJ SOLN
INTRAMUSCULAR | Status: AC | PRN
  Administered 2016-12-26: 1000 mL

## 2016-12-26 MED ORDER — TICAGRELOR 90 MG PO TABS: 90.0000 mg | ORAL_TABLET | Freq: Two times a day (BID) | ORAL | Status: DC

## 2016-12-26 MED ORDER — INSULIN ASPART 100 UNIT/ML ~~LOC~~ SOLN
0.0000 [IU] | Freq: Three times a day (TID) | SUBCUTANEOUS | Status: DC
  Administered 2016-12-26: 20:00:00 5 [IU] via SUBCUTANEOUS
  Administered 2016-12-27: 07:00:00 3 [IU] via SUBCUTANEOUS

## 2016-12-26 MED ORDER — IOPAMIDOL (ISOVUE-370) INJECTION 76%
INTRAVENOUS | Status: DC | PRN
  Administered 2016-12-26: 190 mL

## 2016-12-26 MED ORDER — HEPARIN (PORCINE) IN NACL 2-0.9 UNIT/ML-% IJ SOLN
INTRAMUSCULAR | Status: AC
  Filled 2016-12-26: qty 1000

## 2016-12-26 MED ORDER — INSULIN ASPART 100 UNIT/ML ~~LOC~~ SOLN
4.0000 [IU] | Freq: Three times a day (TID) | SUBCUTANEOUS | Status: DC
  Administered 2016-12-26 – 2016-12-27 (×2): 4 [IU] via SUBCUTANEOUS

## 2016-12-26 MED ORDER — LIDOCAINE HCL (PF) 1 % IJ SOLN
INTRAMUSCULAR | Status: DC | PRN
  Administered 2016-12-26: 2 mL

## 2016-12-26 MED ORDER — ACETAMINOPHEN 325 MG PO TABS
650.0000 mg | ORAL_TABLET | ORAL | Status: DC | PRN
  Administered 2016-12-26: 650 mg via ORAL
  Filled 2016-12-26: qty 2

## 2016-12-26 MED ORDER — SODIUM CHLORIDE 0.9 % IV SOLN: 250.0000 mL | INTRAVENOUS | Status: DC | PRN

## 2016-12-26 MED ORDER — FENTANYL CITRATE (PF) 100 MCG/2ML IJ SOLN
INTRAMUSCULAR | Status: DC | PRN
Start: 2016-12-26 — End: 2016-12-26
  Administered 2016-12-26 (×2): 25 ug via INTRAVENOUS

## 2016-12-26 MED ORDER — VERAPAMIL HCL 2.5 MG/ML IV SOLN
INTRAVENOUS | Status: AC
  Filled 2016-12-26: qty 2

## 2016-12-26 MED ORDER — SODIUM CHLORIDE 0.9 % IV SOLN: INTRAVENOUS | Status: AC

## 2016-12-26 MED ORDER — NITROGLYCERIN 1 MG/10 ML FOR IR/CATH LAB
INTRA_ARTERIAL | Status: DC | PRN
  Administered 2016-12-26: 150 ug via INTRACORONARY
  Administered 2016-12-26: 200 ug via INTRACORONARY

## 2016-12-26 MED ORDER — SODIUM CHLORIDE 0.9 % IV SOLN
INTRAVENOUS | Status: AC | PRN
  Administered 2016-12-26: 10:00:00
  Administered 2016-12-26: 1.75 mg/kg/h via INTRAVENOUS

## 2016-12-26 MED ORDER — ASPIRIN 81 MG PO CHEW
81.0000 mg | CHEWABLE_TABLET | Freq: Every day | ORAL | Status: DC
  Administered 2016-12-27: 81 mg via ORAL
  Filled 2016-12-26: qty 1

## 2016-12-26 MED ORDER — SODIUM CHLORIDE 0.9 % WEIGHT BASED INFUSION
1.0000 mL/kg/h | INTRAVENOUS | Status: DC
  Administered 2016-12-26: 1 mL/kg/h via INTRAVENOUS

## 2016-12-26 MED ORDER — ATORVASTATIN CALCIUM 80 MG PO TABS
80.0000 mg | ORAL_TABLET | Freq: Every day | ORAL | Status: DC
  Administered 2016-12-26: 20:00:00 80 mg via ORAL
  Filled 2016-12-26: qty 1

## 2016-12-26 MED ORDER — FENTANYL CITRATE (PF) 100 MCG/2ML IJ SOLN
INTRAMUSCULAR | Status: AC
  Filled 2016-12-26: qty 2

## 2016-12-26 MED ORDER — SODIUM CHLORIDE 0.9 % IV SOLN: INTRAVENOUS | Status: DC

## 2016-12-26 MED ORDER — VERAPAMIL HCL 2.5 MG/ML IV SOLN
INTRAVENOUS | Status: DC | PRN
  Administered 2016-12-26: 10 mL via INTRA_ARTERIAL

## 2016-12-26 MED ORDER — IOPAMIDOL (ISOVUE-370) INJECTION 76%
INTRAVENOUS | Status: AC
  Filled 2016-12-26: qty 100

## 2016-12-26 MED ORDER — LIVING WELL WITH DIABETES BOOK
Freq: Once | Status: DC
  Filled 2016-12-26: qty 1

## 2016-12-26 MED ORDER — DIAZEPAM 5 MG PO TABS: 5.0000 mg | ORAL_TABLET | Freq: Four times a day (QID) | ORAL | Status: DC | PRN

## 2016-12-26 SURGICAL SUPPLY — 19 items
BALLN SAPPHIRE 2.0X12 (BALLOONS) ×2
BALLN ~~LOC~~ EUPHORA RX 2.75X12 (BALLOONS) ×2
BALLOON SAPPHIRE 2.0X12 (BALLOONS) ×1 IMPLANT
BALLOON ~~LOC~~ EUPHORA RX 2.75X12 (BALLOONS) ×1 IMPLANT
CATH EXPO 5F FL3.5 (CATHETERS) ×2 IMPLANT
CATH LAUNCHER 6FR JR3.5 SH (CATHETERS) ×2 IMPLANT
CATH LAUNCHER 6FR JR4 (CATHETERS) ×2 IMPLANT
CATH VISTA GUIDE 6FR JR3.5 (CATHETERS) ×2 IMPLANT
DEVICE RAD COMP TR BAND LRG (VASCULAR PRODUCTS) ×2 IMPLANT
GLIDESHEATH SLEND SS 6F .021 (SHEATH) ×2 IMPLANT
GUIDEWIRE INQWIRE 1.5J.035X260 (WIRE) ×1 IMPLANT
INQWIRE 1.5J .035X260CM (WIRE) ×2
KIT ENCORE 26 ADVANTAGE (KITS) ×4 IMPLANT
KIT HEART LEFT (KITS) ×2 IMPLANT
PACK CARDIAC CATHETERIZATION (CUSTOM PROCEDURE TRAY) ×2 IMPLANT
STENT SIERRA 2.50 X 15 MM (Permanent Stent) ×2 IMPLANT
TRANSDUCER W/STOPCOCK (MISCELLANEOUS) ×2 IMPLANT
TUBING CIL FLEX 10 FLL-RA (TUBING) ×2 IMPLANT
WIRE COUGAR XT STRL 190CM (WIRE) ×2 IMPLANT

## 2016-12-26 NOTE — H&P (View-Only) (Signed)
Progress Note  Patient Name: Matthew [name] Date of Encounter: 12/26/2016  Primary Cardiologist: Meda Coffee  Subjective   No chest pain this am. He did have chest pain last night following his LAD PCI and was started on heparin. BP was elevated at the time. No chest pain over last 8 hours.   Inpatient Medications    Scheduled Meds: . aspirin  81 mg Oral Daily  . aspirin  81 mg Oral Pre-Cath  . atorvastatin  80 mg Oral q1800  . cycloSPORINE  1 drop Both Eyes BID  . insulin aspart  0-9 Units Subcutaneous Q4H  . metoprolol succinate  12.5 mg Oral Daily  . potassium chloride  40 mEq Oral Once  . sodium chloride flush  3 mL Intravenous Q12H  . sodium chloride flush  3 mL Intravenous Q12H  . ticagrelor  90 mg Oral BID   Continuous Infusions: . sodium chloride    . sodium chloride    . sodium chloride 1 mL/kg/hr (12/26/16 0600)  . heparin 1,000 Units/hr (12/26/16 0600)   PRN Meds: sodium chloride, sodium chloride, acetaminophen, ALPRAZolam, fentaNYL (SUBLIMAZE) injection, nitroGLYCERIN, ondansetron (ZOFRAN) IV, sodium chloride flush, sodium chloride flush   Vital Signs    Vitals:   12/26/16 0400 12/26/16 0600 12/26/16 0711 12/26/16 0741  BP: (!) 142/64 (!) 156/79  (!) 165/82  Pulse: (!) 58 64 67 66  Resp: 15 14 15    Temp: 98.4 F (36.9 C) 98.6 F (37 C)  98.2 F (36.8 C)  TempSrc: Oral Oral  Oral  SpO2: 98% 97%  98%  Weight:  183 lb 3.2 oz (83.1 kg)    Height:        Intake/Output Summary (Last 24 hours) at 12/26/2016 0833 Last data filed at 12/26/2016 0700 Gross per 24 hour  Intake 751.15 ml  Output 400 ml  Net 351.15 ml   Filed Weights   12/24/16 0843 12/25/16 0404 12/26/16 0600  Weight: 184 lb (83.5 kg) 183 lb 1.6 oz (83.1 kg) 183 lb 3.2 oz (83.1 kg)    Telemetry     Sinus- Personally Reviewed  ECG    NSR RBBB - Personally Reviewed  Physical Exam   GEN: No acute distress.   Neck: No JVD Cardiac: RRR, no murmurs, rubs, or gallops.  Respiratory:  Clear to auscultation bilaterally. GI: Soft, nontender, non-distended  MS: No edema; No deformity. Neuro:  Nonfocal  Psych: Normal affect   Labs    Chemistry Recent Labs  Lab 12/23/16 2201 12/25/16 0357 12/26/16 0259  NA 136 139 137  K 4.0 3.6 3.3*  CL 100* 106 105  CO2 24 25 25   GLUCOSE 288* 117* 142*  BUN 31* 18 15  CREATININE 1.44* 1.05 1.01  CALCIUM 9.4 8.8* 8.6*  GFRNONAA 46* >60 >60  GFRAA 54* >60 >60  ANIONGAP 12 8 7      Hematology Recent Labs  Lab 12/23/16 2201 12/25/16 0357 12/26/16 0259  WBC 7.8 6.0 7.2  RBC 4.92 4.17* 4.16*  HGB 15.6 12.6* 12.7*  HCT 44.0 37.5* 37.3*  MCV 89.4 89.9 89.7  MCH 31.7 30.2 30.5  MCHC 35.5 33.6 34.0  RDW 12.5 12.6 12.9  PLT 237 202 179    Cardiac Enzymes Recent Labs  Lab 12/24/16 0100 12/24/16 0634 12/25/16 1852 12/26/16 0259  TROPONINI 0.27* 1.76* 0.28* 0.71*    Recent Labs  Lab 12/23/16 2210  TROPIPOC 0.00     BNPNo results for input(s): BNP, PROBNP in the last 168 hours.  DDimer No results for input(s): DDIMER in the last 168 hours.   Radiology    No results found.  Cardiac Studies   Cardiac cath 12/25/16: Diagnostic Diagram       Post-Intervention Diagram          Patient Profile     76 y.o. male with history of DM and HTN admitted with a NSTEMI. PCI of LAD yesterday. Stable this am.   Assessment & Plan    1. CAD/NSTEMI: Pt admitted with NSTEMI. PCI of LAD yesterday with placement of 3 DES. He has residual disease in the RCA and is going down this am for PCI of the RCA with DR. Claiborne Billings. Will plan relook of LAD also given chest pain overnight. EKG is normal this am.   For questions or updates, please contact [place] Please consult [email or web address] for contact info under Cardiology/STEMI.      Signed, Lauree Chandler, MD  12/26/2016, 8:33 AM

## 2016-12-26 NOTE — Progress Notes (Signed)
Progress Note  Patient Name: Matthew [name] Date of Encounter: 12/26/2016  Primary Cardiologist: Meda Coffee  Subjective   No chest pain this am. He did have chest pain last night following his LAD PCI and was started on heparin. BP was elevated at the time. No chest pain over last 8 hours.   Inpatient Medications    Scheduled Meds: . aspirin  81 mg Oral Daily  . aspirin  81 mg Oral Pre-Cath  . atorvastatin  80 mg Oral q1800  . cycloSPORINE  1 drop Both Eyes BID  . insulin aspart  0-9 Units Subcutaneous Q4H  . metoprolol succinate  12.5 mg Oral Daily  . potassium chloride  40 mEq Oral Once  . sodium chloride flush  3 mL Intravenous Q12H  . sodium chloride flush  3 mL Intravenous Q12H  . ticagrelor  90 mg Oral BID   Continuous Infusions: . sodium chloride    . sodium chloride    . sodium chloride 1 mL/kg/hr (12/26/16 0600)  . heparin 1,000 Units/hr (12/26/16 0600)   PRN Meds: sodium chloride, sodium chloride, acetaminophen, ALPRAZolam, fentaNYL (SUBLIMAZE) injection, nitroGLYCERIN, ondansetron (ZOFRAN) IV, sodium chloride flush, sodium chloride flush   Vital Signs    Vitals:   12/26/16 0400 12/26/16 0600 12/26/16 0711 12/26/16 0741  BP: (!) 142/64 (!) 156/79  (!) 165/82  Pulse: (!) 58 64 67 66  Resp: 15 14 15    Temp: 98.4 F (36.9 C) 98.6 F (37 C)  98.2 F (36.8 C)  TempSrc: Oral Oral  Oral  SpO2: 98% 97%  98%  Weight:  183 lb 3.2 oz (83.1 kg)    Height:        Intake/Output Summary (Last 24 hours) at 12/26/2016 0833 Last data filed at 12/26/2016 0700 Gross per 24 hour  Intake 751.15 ml  Output 400 ml  Net 351.15 ml   Filed Weights   12/24/16 0843 12/25/16 0404 12/26/16 0600  Weight: 184 lb (83.5 kg) 183 lb 1.6 oz (83.1 kg) 183 lb 3.2 oz (83.1 kg)    Telemetry     Sinus- Personally Reviewed  ECG    NSR RBBB - Personally Reviewed  Physical Exam   GEN: No acute distress.   Neck: No JVD Cardiac: RRR, no murmurs, rubs, or gallops.  Respiratory:  Clear to auscultation bilaterally. GI: Soft, nontender, non-distended  MS: No edema; No deformity. Neuro:  Nonfocal  Psych: Normal affect   Labs    Chemistry Recent Labs  Lab 12/23/16 2201 12/25/16 0357 12/26/16 0259  NA 136 139 137  K 4.0 3.6 3.3*  CL 100* 106 105  CO2 24 25 25   GLUCOSE 288* 117* 142*  BUN 31* 18 15  CREATININE 1.44* 1.05 1.01  CALCIUM 9.4 8.8* 8.6*  GFRNONAA 46* >60 >60  GFRAA 54* >60 >60  ANIONGAP 12 8 7      Hematology Recent Labs  Lab 12/23/16 2201 12/25/16 0357 12/26/16 0259  WBC 7.8 6.0 7.2  RBC 4.92 4.17* 4.16*  HGB 15.6 12.6* 12.7*  HCT 44.0 37.5* 37.3*  MCV 89.4 89.9 89.7  MCH 31.7 30.2 30.5  MCHC 35.5 33.6 34.0  RDW 12.5 12.6 12.9  PLT 237 202 179    Cardiac Enzymes Recent Labs  Lab 12/24/16 0100 12/24/16 0634 12/25/16 1852 12/26/16 0259  TROPONINI 0.27* 1.76* 0.28* 0.71*    Recent Labs  Lab 12/23/16 2210  TROPIPOC 0.00     BNPNo results for input(s): BNP, PROBNP in the last 168 hours.  DDimer No results for input(s): DDIMER in the last 168 hours.   Radiology    No results found.  Cardiac Studies   Cardiac cath 12/25/16: Diagnostic Diagram       Post-Intervention Diagram          Patient Profile     76 y.o. male with history of DM and HTN admitted with a NSTEMI. PCI of LAD yesterday. Stable this am.   Assessment & Plan    1. CAD/NSTEMI: Pt admitted with NSTEMI. PCI of LAD yesterday with placement of 3 DES. He has residual disease in the RCA and is going down this am for PCI of the RCA with DR. Claiborne Billings. Will plan relook of LAD also given chest pain overnight. EKG is normal this am.   For questions or updates, please contact [place] Please consult [email or web address] for contact info under Cardiology/STEMI.      Signed, Lauree Chandler, MD  12/26/2016, 8:33 AM

## 2016-12-26 NOTE — Progress Notes (Signed)
TR BAND REMOVAL  LOCATION:    right radial  DEFLATED PER PROTOCOL:    Yes.    TIME BAND OFF / DRESSING APPLIED:    1530   SITE UPON ARRIVAL:    Level 1  SITE AFTER BAND REMOVAL:    Level 1  CIRCULATION SENSATION AND MOVEMENT:    Within Normal Limits   Yes.    COMMENTS:   Checked frequently during shift with no change, bruise proximal to site, soft with no hematoma. No change from original assessment. Dressing remains dry and intact.

## 2016-12-26 NOTE — Interval H&P Note (Signed)
Cath Lab Visit (complete for each Cath Lab visit)  Clinical Evaluation Leading to the Procedure:   ACS: No.  Non-ACS:    Anginal Classification: CCS IV  Anti-ischemic medical therapy: Minimal Therapy (1 class of medications)  Non-Invasive Test Results: No non-invasive testing performed  Prior CABG: No previous CABG      History and Physical Interval Note:  12/26/2016 9:05 AM  Matthew [name]  has presented today for surgery, with the diagnosis of cad  The various methods of treatment have been discussed with the patient and family. After consideration of risks, benefits and other options for treatment, the patient has consented to  Procedure(s): CORONARY STENT INTERVENTION (N/A) as a surgical intervention .  The patient's history has been reviewed, patient examined, no change in status, stable for surgery.  I have reviewed the patient's chart and labs.  Questions were answered to the patient's satisfaction.     Shelva Majestic

## 2016-12-26 NOTE — Progress Notes (Signed)
Inpatient Diabetes Program Recommendations  AACE/ADA: New Consensus Statement on Inpatient Glycemic Control (2015)  Target Ranges:  Prepandial:   less than 140 mg/dL      Peak postprandial:   less than 180 mg/dL (1-2 hours)      Critically ill patients:  140 - 180 mg/dL   Review of Glycemic Control  Diabetes history: DM 2 Outpatient Diabetes medications: Tresiba 20 units Daily Current orders for Inpatient glycemic control: Novolog Moderate Correction 0-15 units tid, Novolog HS scale 0-5 units, Novolog 4 units tid with meals  Spoke with patient about diabetes and home regimen for diabetes control. Patient reports that he is followed by Dr. Deforest Hoyles for diabetes management. Patient reports that he is taking insulin as prescribed and that he last saw his PCP about 1 month ago and was placed on insulin Tresiba at that time because of his A1c and glucose levels being in the 400's. Since that time, patient reports glucose in the 200's, which is consistent with the current A1c level. Patient reports a couple of weeks ago he and his brother have been trying a boiled egg soaked in white vinegar and patient claims his glucose has been in the 100's. Spoke with patient about f/u with Dr. Deforest Hoyles. Patient drinks water and crystal light. Patient states that he has even lost weight recently.  Patient reports being first diagnosed with diabetes in 1999 and lost weight and "did not have it anymore.  Discussed A1C results (11.3% on 11/20). Discussed glucose and A1C goals. Discussed importance of checking CBGs and maintaining good CBG control to prevent long-term and short-term complications. Explained how hyperglycemia leads to damage within blood vessels which lead to the common complications seen with uncontrolled diabetes.  Discussed impact of nutrition, exercise, stress, sickness, and medications on diabetes control. Patient verbalized understanding of information discussed and he states that he has no further  questions at this time related to diabetes.   Thanks,  Tama Headings RN, MSN, [facility] Inpatient Diabetes Coordinator Team Pager [phone] (8a-5p)

## 2016-12-26 NOTE — Progress Notes (Signed)
*  PRELIMINARY RESULTS* Echocardiogram 2D Echocardiogram has been performed.  Leavy Cella 12/26/2016, 3:48 PM

## 2016-12-26 NOTE — Plan of Care (Signed)
Discussed current A1c level and glucose and A1c goals Discussed Symptoms of Hypoglycemia/Hyperglycemia s/s and treatment for both Discussed home medications Discussed when to call PCP Discussed CBG checks when and why and parameters for when to call the PCP

## 2016-12-26 NOTE — Care Management Note (Addendum)
Case Management Note  Patient Details  Name: Matthew [name] MRN: [id number] Date of Birth: 02/01/1941  Subjective/Objective:  From home, for coronary stent intervention, will be on brilinta. NCM gave patient the 30 day savings coupon card for brilinta.  He will be going to [facility] [place] to get first 30 day free. They do have it in stock.  NCM told him what his co pay will be.                  Action/Plan: NCM will follow for dc needs.   Expected Discharge Date:                  Expected Discharge Plan:  Home/Self Care  In-House Referral:     Discharge planning Services  CM Consult  Post Acute Care Choice:    Choice offered to:     DME Arranged:    DME Agency:     HH Arranged:    [place] Agency:     Status of Service:  Completed, signed off  If discussed at [employer] of Stay Meetings, dates discussed:    Additional Comments:  [name], RN 12/26/2016, 10:32 AM

## 2016-12-26 NOTE — Progress Notes (Signed)
Progress Note    Matthew [name]  LFY:[id number] DOB: 1941/01/20  DOA: 12/23/2016 PCP: Wenda Low, MD    Brief Narrative:   Chief complaint: Follow-up chest pain  Medical records reviewed and are as summarized below:  Matthew [name] is an 76 y.o. male with PMH of type 2 diabetes, hypertension who was admitted 12/23/16 for evaluation of chest pain/pressure improved with nitroglycerin and aspirin. Troponin elevation noted during initial workup.  Assessment/Plan:   Principal Problem:   Chest pain, rule out acute myocardial infarction Continue aspirin and heparin drip. Troponin bump from 0.27---> 1.76. Ischemic changes noted on EKG. Continue atorvastatin, metoprolol and Nitroglycerin when necessary chest pain. 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 Normal LV systolic function noted. For staged PCI of the RCA today.  Active Problems:   Hypokalemia Replete potassium.    DM2 (diabetes mellitus, type 2) (HCC) Continue insulin sensitive SSI every 4 hours. CBGs 95-219. Currently nothing by mouth. Hemoglobin A1c elevated at 11.3 indicating very poor outpatient control. Diabetes coordinator consultation.    HTN (hypertension) Currently being managed with Norvasc.    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 Unknown baseline.  Body mass index is 27.05 kg/m.   Family Communication/Anticipated D/C date and plan/Code Status   DVT prophylaxis: Heparin ordered. Code Status: Full Code.  Family Communication: Family updated at bedside 12/25/16, no family present today. Disposition Plan: Home when cleared by cardiology.   Medical Consultants:    Cardiology   Anti-Infectives:    None  Subjective:   Denies chest pain, dyspnea, cough, nausea, diaphoresis.  Objective:    Vitals:   12/26/16 0400 12/26/16  0600 12/26/16 0711 12/26/16 0741  BP: (!) 142/64 (!) 156/79  (!) 165/82  Pulse: (!) 58 64 67 66  Resp: 15 14 15    Temp: 98.4 F (36.9 C) 98.6 F (37 C)  98.2 F (36.8 C)  TempSrc: Oral Oral  Oral  SpO2: 98% 97%  98%  Weight:  83.1 kg (183 lb 3.2 oz)    Height:        Intake/Output Summary (Last 24 hours) at 12/26/2016 0851 Last data filed at 12/26/2016 0700 Gross per 24 hour  Intake 751.15 ml  Output 400 ml  Net 351.15 ml   Filed Weights   12/24/16 0843 12/25/16 0404 12/26/16 0600  Weight: 83.5 kg (184 lb) 83.1 kg (183 lb 1.6 oz) 83.1 kg (183 lb 3.2 oz)    Exam: General: No acute distress. Cardiovascular: Heart sounds show a regular rate, and rhythm. No gallops or rubs. No murmurs. No JVD. Lungs: Clear to auscultation bilaterally with good air movement. No rales, rhonchi or wheezes. Abdomen: Soft, nontender, nondistended with normal active bowel sounds. No masses. No hepatosplenomegaly. Skin: Warm and dry. No rashes or lesions. Extremities: No clubbing or cyanosis. No edema. Pedal pulses 2+.  Data Reviewed:   I have personally reviewed following labs and imaging studies:  Labs: Labs show the following:   Basic Metabolic Panel: Recent Labs  Lab 12/23/16 2201 12/25/16 0357 12/26/16 0259  NA 136 139 137  K 4.0 3.6 3.3*  CL 100* 106 105  CO2 24 25 25   GLUCOSE 288* 117* 142*  BUN 31* 18 15  CREATININE 1.44* 1.05 1.01  CALCIUM 9.4 8.8* 8.6*   GFR Estimated Creatinine Clearance: 64.2 mL/min (by C-G formula based on SCr  of 1.01 mg/dL). Coagulation profile Recent Labs  Lab 12/24/16 0058 12/25/16 0357  INR 0.95 1.05    CBC: Recent Labs  Lab 12/23/16 2201 12/25/16 0357 12/26/16 0259  WBC 7.8 6.0 7.2  HGB 15.6 12.6* 12.7*  HCT 44.0 37.5* 37.3*  MCV 89.4 89.9 89.7  PLT 237 202 179   Cardiac Enzymes: Recent Labs  Lab 12/24/16 0100 12/24/16 0634 12/25/16 1852 12/26/16 0259  TROPONINI 0.27* 1.76* 0.28* 0.71*   CBG: Recent Labs  Lab  12/25/16 1148 12/25/16 1708 12/25/16 2048 12/26/16 0152 12/26/16 0547  GLUCAP 95 126* 219* 174* 111*   Microbiology No results found for this or any previous visit (from the past 240 hour(s)).  Procedures and diagnostic studies:  12/23/16 Dg Chest 2 View: My independent review of the image shows: Clear lungs. Normal heart size. No infiltrates.  Medications:   . aspirin  81 mg Oral Daily  . atorvastatin  80 mg Oral q1800  . cycloSPORINE  1 drop Both Eyes BID  . insulin aspart  0-9 Units Subcutaneous Q4H  . metoprolol succinate  12.5 mg Oral Daily  . sodium chloride flush  3 mL Intravenous Q12H  . sodium chloride flush  3 mL Intravenous Q12H  . ticagrelor  90 mg Oral BID   Continuous Infusions: . sodium chloride    . sodium chloride    . sodium chloride 1 mL/kg/hr (12/26/16 0844)  . heparin 1,000 Units/hr (12/26/16 0600)     LOS: 2 days   Jacquelynn Cree  [facility] Pager [phone]. If unable to reach me by pager, please call my cell phone at [phone].  *Please refer to [email or web address], password [email or web address] to get updated schedule on who will round on this patient, as hospitalists switch teams weekly. If 7PM-7AM, please contact night-coverage at [email or web address], password [email or web address] for any overnight needs.  12/26/2016, 8:51 AM

## 2016-12-26 NOTE — Progress Notes (Signed)
#  6. S/W CRYSTAL @ [facility] # [phone]    BRILINITA  90 MG BID   COVER- YES  CO-PAY- $ 45.00  TIER- 3 DRUG  PRIOR APPROVAL- NO   DEDUCTIBLE: NOT MET   PREFERRED [facility]

## 2016-12-27 ENCOUNTER — Encounter (HOSPITAL_COMMUNITY): Payer: Self-pay | Admitting: Cardiovascular Disease

## 2016-12-27 DIAGNOSIS — Z955 Presence of coronary angioplasty implant and graft: Secondary | ICD-10-CM

## 2016-12-27 LAB — BASIC METABOLIC PANEL
Anion gap: 7 (ref 5–15)
BUN: 15 mg/dL (ref 6–20)
CO2: 25 mmol/L (ref 22–32)
Calcium: 8.7 mg/dL — ABNORMAL LOW (ref 8.9–10.3)
Chloride: 105 mmol/L (ref 101–111)
Creatinine, Ser: 1.05 mg/dL (ref 0.61–1.24)
GFR calc Af Amer: >60 mL/min (ref >60)
GFR calc non Af Amer: >60 mL/min (ref >60)
Glucose, Bld: 169 mg/dL — ABNORMAL HIGH (ref 65–99)
Potassium: 3.7 mmol/L (ref 3.5–5.1)
Sodium: 137 mmol/L (ref 135–145)

## 2016-12-27 LAB — CBC
HCT: 36.4 % — ABNORMAL LOW (ref 39.0–52.0)
Hemoglobin: 12.4 g/dL — ABNORMAL LOW (ref 13.0–17.0)
MCH: 30.5 pg (ref 26.0–34.0)
MCHC: 34.1 g/dL (ref 30.0–36.0)
MCV: 89.4 fL (ref 78.0–100.0)
Platelets: 177 10*3/uL (ref 150–400)
RBC: 4.07 MIL/uL — ABNORMAL LOW (ref 4.22–5.81)
RDW: 12.7 % (ref 11.5–15.5)
WBC: 5.9 10*3/uL (ref 4.0–10.5)

## 2016-12-27 LAB — GLUCOSE, CAPILLARY: Glucose-Capillary: 153 mg/dL — ABNORMAL HIGH (ref 65–99)

## 2016-12-27 MED ORDER — ACETAMINOPHEN 325 MG PO TABS
650.0000 mg | ORAL_TABLET | ORAL | Status: DC | PRN
Start: 2016-12-27 — End: 2017-01-13

## 2016-12-27 MED ORDER — ASPIRIN 81 MG PO CHEW: 81.0000 mg | CHEWABLE_TABLET | Freq: Every day | ORAL | 3 refills | Status: DC

## 2016-12-27 MED ORDER — LOSARTAN POTASSIUM-HCTZ 100-12.5 MG PO TABS: 1.0000 | ORAL_TABLET | Freq: Every day | ORAL | 3 refills | Status: DC

## 2016-12-27 MED ORDER — ATORVASTATIN CALCIUM 80 MG PO TABS
80.0000 mg | ORAL_TABLET | Freq: Every day | ORAL | 3 refills | Status: DC
Start: 2016-12-27 — End: 2017-10-31

## 2016-12-27 MED ORDER — AMLODIPINE BESYLATE 5 MG PO TABS
5.0000 mg | ORAL_TABLET | Freq: Every day | ORAL | Status: DC
  Administered 2016-12-27: 5 mg via ORAL
  Filled 2016-12-27: qty 1

## 2016-12-27 MED ORDER — TICAGRELOR 90 MG PO TABS
90.0000 mg | ORAL_TABLET | Freq: Two times a day (BID) | ORAL | 2 refills | Status: DC
Start: 2016-12-27 — End: 2017-08-27

## 2016-12-27 MED ORDER — INSULIN ASPART PROT & ASPART (70-30 MIX) 100 UNIT/ML PEN: 20.0000 [IU] | PEN_INJECTOR | Freq: Two times a day (BID) | SUBCUTANEOUS | 11 refills | Status: DC

## 2016-12-27 MED ORDER — LOSARTAN POTASSIUM 50 MG PO TABS
100.0000 mg | ORAL_TABLET | Freq: Every day | ORAL | Status: DC
  Administered 2016-12-27: 10:00:00 100 mg via ORAL
  Filled 2016-12-27: qty 2

## 2016-12-27 MED ORDER — NITROGLYCERIN 0.4 MG SL SUBL: 0.4000 mg | SUBLINGUAL_TABLET | SUBLINGUAL | 12 refills | Status: AC | PRN

## 2016-12-27 NOTE — Discharge Instructions (Signed)
Acute Coronary Syndrome °Acute coronary syndrome (ACS) is a serious problem in which there is suddenly not enough blood and oxygen supplied to the heart. ACS may mean that one or more of the blood vessels in your heart (coronary arteries) may be blocked. ACS can result in chest pain or a heart attack (myocardial infarction or MI). °What are the causes? °This condition is caused by atherosclerosis, which is the buildup of fat and cholesterol (plaque) on the inside of the arteries. Over time, the plaque may narrow or block the artery, and this will lessen blood flow to the heart. Plaque can also become weak and break off within a coronary artery to form a clot and cause a sudden blockage. °What increases the risk? °The risk factors of this condition include: °· High cholesterol levels. °· High blood pressure (hypertension). °· Smoking. °· Diabetes. °· Age. °· Family history of chest pain, heart disease, or stroke. °· Lack of exercise. °What are the signs or symptoms? °The most common signs of this condition include: °· Chest pain, which can be: °¨ A crushing or squeezing in the chest. °¨ A tightness, pressure, fullness, or heaviness in the chest. °¨ Present for more than a few minutes, or it can stop and recur. °· Pain in the arms, neck, jaw, or back. °· Unexplained heartburn or indigestion. °· Shortness of breath. °· Nausea. °· Sudden cold sweats. °· Feeling light-headed or dizzy. °Sometimes, this condition has no symptoms. °How is this diagnosed? °ACS may be diagnosed through the following tests: °· Electrocardiogram (ECG). °· Blood tests. °· Coronary angiogram. This is a procedure to look at the coronary arteries to see if there is any blockage. °How is this treated? °Treatment for ACS may include: °· Healthy behavioral changes to reduce or control risk factors. °· Medicine. °· Coronary stenting. A stent helps to keep an artery open. °· Coronary angioplasty. This procedure widens a narrowed or blocked  artery. °· Coronary artery bypass surgery. This will allow your blood to pass the blockage (bypass) to reach your heart. °Follow these instructions at home: °Eating and drinking °· Follow a heart-healthy diet. A dietitian can you help to educate you about healthy food options and changes. °· Use healthy cooking methods such as roasting, grilling, broiling, baking, poaching, steaming, or stir-frying. Talk to a dietitian to learn more about healthy cooking methods. °Medicines °· Take medicines only as directed by your health care provider. °· Do not take the following medicines unless your health care provider approves: °¨ Nonsteroidal anti-inflammatory drugs (NSAIDs), such as ibuprofen, naproxen, or celecoxib. °¨ Vitamin supplements that contain vitamin A, vitamin E, or both. °¨ Hormone replacement therapy that contains estrogen with or without progestin. °· Stop illegal drug use. °Activity °· Follow an exercise program that is approved by your health care provider. °· Plan rest periods when you are fatigued. °Lifestyle °· Do not use any tobacco products, including cigarettes, chewing tobacco, or electronic cigarettes. If you need help quitting, ask your health care provider. °· If you drink alcohol, and your health care provider approves, limit your alcohol intake to no more than 1 drink per day. One drink equals 12 ounces of beer, 5 ounces of wine, or 1½ ounces of hard liquor. °· Learn to manage stress. °· Maintain a healthy weight. Lose weight as approved by your health care provider. °General instructions °· Manage other health conditions, such as hypertension and diabetes, as directed by your health care provider. °· Keep all follow-up visits as directed by your   as directed by your health care provider. This is important.  Your health care provider may ask you to monitor your blood pressure. A blood pressure reading consists of a higher number over a lower number, such as 110 over 72, written as 110/72. Ideally, your blood  pressure should be: ? Below 140/90 if you have no other medical conditions. ? Below 130/80 if you have diabetes or kidney disease. Get help right away if:  You have pain in your chest, neck, arm, jaw, stomach, or back that lasts more than a few minutes, is recurring, or is not relieved by taking medicine under your tongue (sublingual nitroglycerin).  You have profuse sweating without cause.  You have unexplained: ? Heartburn or indigestion. ? Shortness of breath or difficulty breathing. ? Nausea or vomiting. ? Fatigue. ? Feelings of nervousness or anxiety. ? Weakness. ? Diarrhea.  You have sudden light-headedness or dizziness.  You faint. These symptoms may represent a serious problem that is an emergency. Do not wait to see if the symptoms will go away. Get medical help right away. Call your local emergency services (911 in the [place] Do not drive yourself to the clinic or hospital. This information is not intended to replace advice given to you by your health care provider. Make sure you discuss any questions you have with your health care provider. Document Released: 01/23/2005 Document Revised: 07/07/2015 Document Reviewed: 05/27/2013 Elsevier Interactive Patient Education  2017 [place].   Blood Glucose Monitoring, Adult Monitoring your blood sugar (glucose) helps you manage your diabetes. It also helps you and your health care provider determine how well your diabetes management plan is working. Blood glucose monitoring involves checking your blood glucose as often as directed, and keeping a record (log) of your results over time. Why should I monitor my blood glucose? Checking your blood glucose regularly can:  Help you understand how food, exercise, illnesses, and medicines affect your blood glucose.  Let you know what your blood glucose is at any time. You can quickly tell if you are having low blood glucose (hypoglycemia) or high blood glucose (hyperglycemia).  Help  you and your health care provider adjust your medicines as needed.  When should I check my blood glucose? Follow instructions from your health care provider about how often to check your blood glucose. This may depend on:  The type of diabetes you have.  How well-controlled your diabetes is.  Medicines you are taking.  If you have type 1 diabetes:  Check your blood glucose at least 2 times a day.  Also check your blood glucose: ? Before every insulin injection. ? Before and after exercise. ? Between meals. ? 2 hours after a meal. ? Occasionally between 2:00 a.m. and 3:00 a.m., as directed. ? Before potentially dangerous tasks, like driving or using heavy machinery. ? At bedtime.  You may need to check your blood glucose more often, up to 6-10 times a day: ? If you use an insulin pump. ? If you need multiple daily injections (MDI). ? If your diabetes is not well-controlled. ? If you are ill. ? If you have a history of severe hypoglycemia. ? If you have a history of not knowing when your blood glucose is getting low (hypoglycemia unawareness). If you have type 2 diabetes:  If you take insulin or other diabetes medicines, check your blood glucose at least 2 times a day.  If you are on intensive insulin therapy, check your blood glucose at least 4 times a  day. Occasionally, you may also need to check between 2:00 a.m. and 3:00 a.m., as directed.  Also check your blood glucose: ? Before and after exercise. ? Before potentially dangerous tasks, like driving or using heavy machinery.  You may need to check your blood glucose more often if: ? Your medicine is being adjusted. ? Your diabetes is not well-controlled. ? You are ill. What is a blood glucose log?  A blood glucose log is a record of your blood glucose readings. It helps you and your health care provider: ? Look for patterns in your blood glucose over time. ? Adjust your diabetes management plan as needed.  Every  time you check your blood glucose, write down your result and notes about things that may be affecting your blood glucose, such as your diet and exercise for the day.  Most glucose meters store a record of glucose readings in the meter. Some meters allow you to download your records to a computer. How do I check my blood glucose? Follow these steps to get accurate readings of your blood glucose: Supplies needed   Blood glucose meter.  Test strips for your meter. Each meter has its own strips. You must use the strips that come with your meter.  A needle to prick your finger (lancet). Do not use lancets more than once.  A device that holds the lancet (lancing device).  A journal or log book to write down your results. Procedure  Wash your hands with soap and water.  Prick the side of your finger (not the tip) with the lancet. Use a different finger each time.  Gently rub the finger until a small drop of blood appears.  Follow instructions that come with your meter for inserting the test strip, applying blood to the strip, and using your blood glucose meter.  Write down your result and any notes. Alternative testing sites  Some meters allow you to use areas of your body other than your finger (alternative sites) to test your blood.  If you think you may have hypoglycemia, or if you have hypoglycemia unawareness, do not use alternative sites. Use your finger instead.  Alternative sites may not be as accurate as the fingers, because blood flow is slower in these areas. This means that the result you get may be delayed, and it may be different from the result that you would get from your finger.  The most common alternative sites are: ? Forearm. ? Thigh. ? Palm of the hand. Additional tips  Always keep your supplies with you.  If you have questions or need help, all blood glucose meters have a 24-hour hotline number that you can call. You may also contact your health care  provider.  After you use a few boxes of test strips, adjust (calibrate) your blood glucose meter by following instructions that came with your meter. This information is not intended to replace advice given to you by your health care provider. Make sure you discuss any questions you have with your health care provider. Document Released: 01/26/2003 Document Revised: 08/13/2015 Document Reviewed: 07/05/2015 Elsevier Interactive Patient Education  2017 [place].  Diabetes Mellitus and Food It is important for you to manage your blood sugar (glucose) level. Your blood glucose level can be greatly affected by what you eat. Eating healthier foods in the appropriate amounts throughout the day at about the same time each day will help you control your blood glucose level. It can also help slow or prevent worsening  of your diabetes mellitus. Healthy eating may even help you improve the level of your blood pressure and reach or maintain a healthy weight. General recommendations for healthful eating and cooking habits include:  Eating meals and snacks regularly. Avoid going long periods of time without eating to lose weight.  Eating a diet that consists mainly of plant-based foods, such as fruits, vegetables, nuts, legumes, and whole grains.  Using low-heat cooking methods, such as baking, instead of high-heat cooking methods, such as deep frying.  Work with your dietitian to make sure you understand how to use the Nutrition Facts information on food labels. How can food affect me? Carbohydrates Carbohydrates affect your blood glucose level more than any other type of food. Your dietitian will help you determine how many carbohydrates to eat at each meal and teach you how to count carbohydrates. Counting carbohydrates is important to keep your blood glucose at a healthy level, especially if you are using insulin or taking certain medicines for diabetes mellitus. Alcohol Alcohol can cause sudden  decreases in blood glucose (hypoglycemia), especially if you use insulin or take certain medicines for diabetes mellitus. Hypoglycemia can be a life-threatening condition. Symptoms of hypoglycemia (sleepiness, dizziness, and disorientation) are similar to symptoms of having too much alcohol. If your health care provider has given you approval to drink alcohol, do so in moderation and use the following guidelines:  Women should not have more than one drink per day, and men should not have more than two drinks per day. One drink is equal to: ? 12 oz of beer. ? 5 oz of wine. ? 1 oz of hard liquor.  Do not drink on an empty stomach.  Keep yourself hydrated. Have water, diet soda, or unsweetened iced tea.  Regular soda, juice, and other mixers might contain a lot of carbohydrates and should be counted.  What foods are not recommended? As you make food choices, it is important to remember that all foods are not the same. Some foods have fewer nutrients per serving than other foods, even though they might have the same number of calories or carbohydrates. It is difficult to get your body what it needs when you eat foods with fewer nutrients. Examples of foods that you should avoid that are high in calories and carbohydrates but low in nutrients include:  Trans fats (most processed foods list trans fats on the Nutrition Facts label).  Regular soda.  Juice.  Candy.  Sweets, such as cake, pie, doughnuts, and cookies.  Fried foods.  What foods can I eat? Eat nutrient-rich foods, which will nourish your body and keep you healthy. The food you should eat also will depend on several factors, including:  The calories you need.  The medicines you take.  Your weight.  Your blood glucose level.  Your blood pressure level.  Your cholesterol level.  You should eat a variety of foods, including:  Protein. ? Lean cuts of meat. ? Proteins low in saturated fats, such as fish, egg whites, and  beans. Avoid processed meats.  Fruits and vegetables. ? Fruits and vegetables that may help control blood glucose levels, such as apples, mangoes, and yams.  Dairy products. ? Choose fat-free or low-fat dairy products, such as milk, yogurt, and cheese.  Grains, bread, pasta, and rice. ? Choose whole grain products, such as multigrain bread, whole oats, and brown rice. These foods may help control blood pressure.  Fats. ? Foods containing healthful fats, such as nuts, avocado, olive oil, canola  oil, and fish.  Does everyone with diabetes mellitus have the same meal plan? Because every person with diabetes mellitus is different, there is not one meal plan that works for everyone. It is very important that you meet with a dietitian who will help you create a meal plan that is just right for you. This information is not intended to replace advice given to you by your health care provider. Make sure you discuss any questions you have with your health care provider. Document Released: 10/20/2004 Document Revised: 07/01/2015 Document Reviewed: 12/20/2012 Elsevier Interactive Patient Education  2017 [place].   Coronary Angioplasty, Care After This sheet gives you information about how to care for yourself after your procedure. Your health care provider may also give you more specific instructions. If you have problems or questions, contact your health care provider. What can I expect after the procedure? After your procedure, it is common to have:  Bruising at the catheter insertion site. This usually fades within 1-2 weeks.  Blood collecting in the tissue (hematoma) that may be painful to the touch. It should become smaller and less tender within 1-2 weeks.  Follow these instructions at home: Medicines  Take over-the-counter and prescription medicines only as told by your health care provider.  Blood thinners may be prescribed after your procedure to improve blood  flow. Bathing  You may shower 24-48 hours after the procedure or as told by your health care provider.  Do not take baths, swim, or use a hot tub until your health care provider approves. Insertion site care  Follow instructions from your health care provider about how to take care of your insertion site. Make sure you: ? Wash your hands with soap and water before you change your bandage (dressing). If soap and water are not available, use hand sanitizer. ? Change your dressing as told by your health care provider. ? Gently wash the site with plain soap and water. ? Use a clean towel to pat the area dry. ? Do not rub the site, because this may cause bleeding. ? Do not apply powder or lotion to the site.  Check your insertion site every day for signs of infection. Check for: ? More redness, swelling, or pain. ? More fluid or blood. ? Warmth. ? Pus or a bad smell. Lifestyle   Make any lifestyle changes as recommended by your health care provider. This may include: ? Not using any products that contain nicotine or tobacco, such as cigarettes and e-cigarettes. If you need help quitting, ask your health care provider. ? Managing your weight. ? Getting regular exercise. ? Managing your blood pressure. ? Limiting your alcohol intake. ? Managing other health problems, such as diabetes.  Eat a heart-healthy diet. This should include plenty of fresh fruits and vegetables. Avoid foods that are: ? High in salt (sodium). ? Canned or highly processed. ? High in saturated fat or sugar. ? Fried. General instructions  Do not lift over 10 lb (4.5 kg) for 5 days after your procedure or as told by your health care provider.  Ask your health care provider when it is okay to: ? Return to work or school. ? Resume usual physical activities or sports. ? Resume sexual activity.  Keep all follow-up visits as told by your health care provider. This is important. Contact a health care provider  if:  You have a fever.  You have chills.  You have increased bleeding from the insertion site. Hold pressure on the site.  Get help right away if:  You develop chest pain or shortness of breath, feel faint, or pass out.  You have unusual pain at the insertion site.  You have redness, warmth, or swelling at the insertion site.  You have drainage (other than a small amount of blood on the dressing) from the insertion site.  The insertion site is bleeding, and the bleeding does not stop after 30 minutes of holding steady pressure on the site.  You develop bleeding from any other place, such as from the rectum. There may be bright red blood in your urine or stool, or it may appear as black, tarry stool. This information is not intended to replace advice given to you by your health care provider. Make sure you discuss any questions you have with your health care provider. Document Released: 08/11/2004 Document Revised: 09/23/2015 Document Reviewed: 08/29/2015 Elsevier Interactive Patient Education  2017 [employer].

## 2016-12-27 NOTE — Progress Notes (Signed)
CARDIAC REHAB PHASE I   PRE:  Rate/Rhythm: 69 SR  BP:  Sitting: 156/75      SaO2: 98% RA  MODE:  Ambulation: 500 ft   POST:  Rate/Rhythm: 92 SR   BP:  Sitting: 183/77                   Recheck 145/64      SaO2: 98% RA  Pt in bed. Pt willing to walk. Pt BP was elevated while lying in bed, 156/75. Pt independently ambulated out of bed. Pt ambulated 500 ft with unsteady gait. Gait belt was used during ambulation. Pt returned to bed per pt request. Pt BP was still elevated after walk. BP was 183/77. Ed was started with wife at beside. After 5 minutes, BP was rechecked and it was 145/64. Ed completed with pt and wife. Reviewed stent card, MI Booklet, anti-platelet therapy, NTG, restrictions, heart healthy/diabetic diet, exercise guidelines and CRPII. Pt and wife interested in [place]. Will send referral to [facility].   [id number]  Carma Lair MS, ACSM CEP  9:13 AM 12/27/2016

## 2016-12-27 NOTE — Discharge Summary (Signed)
Physician Discharge Summary  Matthew [name] ERD:[id number] DOB: February 13, 1940 DOA: 12/23/2016  PCP: Wenda Low, MD  Admit date: 12/23/2016 Discharge date: 12/27/2016  Admitted From: Home Discharge disposition: Home   Recommendations for Outpatient Follow-Up:   1. The patient needs close follow-up of his diabetes control.   Discharge Diagnosis:   Principal Problem:   Chest pain, secondary to non-STEMI Active Problems:   DM2 (diabetes mellitus, type 2) (HCC), uncontrolled with complications   HTN (hypertension)   Non-ST elevation (NSTEMI) myocardial infarction ([place])   Acute kidney injury/stage II-III CKD  Discharge Condition: Improved.  Diet recommendation: Low sodium, heart healthy.  Carbohydrate-modified.    History of Present Illness:   Matthew [name] is an 76 y.o. male with PMH of type 2 diabetes, hypertension who was admitted 12/23/16 for evaluation of chest pain/pressure improved with nitroglycerin and aspirin. Troponin elevation noted during initial workup.   Hospital Course by Problem:   Principal Problem:   Chest pain secondary to NSTEMI Troponin bump from 0.27---> 1.76. Ischemic changes noted on EKG.  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 Normal LV systolic function noted. Discharge home on regimen outlined below.  Active Problems:   Hypokalemia Potassium repleted.    DM2 (diabetes mellitus, type 2) (HCC)  Hemoglobin A1c elevated at 11.3 indicating very poor outpatient control.  Counseled extensively regarding the importance of good diabetes control to prevent progression of his cardiac disease. Recommended switching him to 70/30 insulin and advised him to check his blood glucoses 4 times a day and keep a log of his readings to bring with him to his next M.D. appointment. We'll refer him to endocrinology  for better diabetes management.    HTN (hypertension) Continue regimen outlined below.    AKI/Stage II-III CKD  Creatinine improved over admission value suggestive of acute kidney injury with underlying stage II- III CKD.  Medical Consultants:    Cardiology   Discharge Exam:   Vitals:   12/27/16 0729 12/27/16 0730  BP: (!) 156/68 (!) 151/130  Pulse: 62   Resp:  14  Temp: 98.7 F (37.1 C)   SpO2: 98%    Vitals:   12/27/16 0205 12/27/16 0530 12/27/16 0729 12/27/16 0730  BP: 131/69 (!) 162/78 (!) 156/68 (!) 151/130  Pulse: (!) 56 60 62   Resp: 14 17  14   Temp:  98.5 F (36.9 C) 98.7 F (37.1 C)   TempSrc:  Oral Oral   SpO2: 98% 97% 98%   Weight:  83 kg (182 lb 15.7 oz)    Height:        General exam: Appears calm and comfortable.  Respiratory system: Clear to auscultation. Respiratory effort normal. Cardiovascular system: S1 & S2 heard, RRR. No JVD,  rubs, gallops or clicks. No murmurs. Gastrointestinal system: Abdomen is nondistended, soft and nontender. No organomegaly or masses felt. Normal bowel sounds heard. Central nervous system: Alert and oriented. No focal neurological deficits. Extremities: No clubbing,  or cyanosis. No edema. Skin: No rashes, lesions or ulcers. Psychiatry: Judgement and insight appear normal. Mood & affect appropriate.    The results of significant diagnostics from this hospitalization (including imaging, microbiology, ancillary and laboratory) are listed below for reference.     Procedures and Diagnostic Studies:   Dg Chest 2 View  Result Date: 12/23/2016 CLINICAL DATA:  76 year old male with chest pain and shortness of  breath. EXAM: CHEST  2 VIEW COMPARISON:  Chest radiograph dated 06/03/2015 FINDINGS: The lungs are clear. There is no pleural effusion or pneumothorax. The cardiac silhouette is within normal limits. There is atherosclerotic calcification of the aortic arch. There is degenerative changes of the spine. There is a left  shoulder arthroplasty. No acute osseous pathology. IMPRESSION: No active cardiopulmonary disease. Electronically Signed   By: Anner Crete M.D.   On: 12/23/2016 22:19     Labs:   Basic Metabolic Panel: Recent Labs  Lab 12/23/16 2201 12/25/16 0357 12/26/16 0259 12/27/16 0518  NA 136 139 137 137  K 4.0 3.6 3.3* 3.7  CL 100* 106 105 105  CO2 24 25 25 25   GLUCOSE 288* 117* 142* 169*  BUN 31* 18 15 15   CREATININE 1.44* 1.05 1.01 1.05  CALCIUM 9.4 8.8* 8.6* 8.7*   GFR Estimated Creatinine Clearance: 61.7 mL/min (by C-G formula based on SCr of 1.05 mg/dL). Coagulation profile Recent Labs  Lab 12/24/16 0058 12/25/16 0357  INR 0.95 1.05    CBC: Recent Labs  Lab 12/23/16 2201 12/25/16 0357 12/26/16 0259 12/27/16 0518  WBC 7.8 6.0 7.2 5.9  HGB 15.6 12.6* 12.7* 12.4*  HCT 44.0 37.5* 37.3* 36.4*  MCV 89.4 89.9 89.7 89.4  PLT 237 202 179 177   Cardiac Enzymes: Recent Labs  Lab 12/24/16 0100 12/24/16 0634 12/25/16 1852 12/26/16 0259  TROPONINI 0.27* 1.76* 0.28* 0.71*   CBG: Recent Labs  Lab 12/26/16 0547 12/26/16 1107 12/26/16 1634 12/26/16 2156 12/27/16 0614  GLUCAP 111* 128* 220* 91 153*    Hgb A1c Recent Labs    12/26/16 0259  HGBA1C 11.3*     Discharge Instructions:   Discharge Instructions    Amb Referral to Cardiac Rehabilitation   Complete by:  As directed    Diagnosis:   NSTEMI Coronary Stents     Call MD for:   Complete by:  As directed    Elevated blood glucoses above 250.   Call MD for:  extreme fatigue   Complete by:  As directed    Call MD for:  persistant dizziness or light-headedness   Complete by:  As directed    Call MD for:  severe uncontrolled pain   Complete by:  As directed    Diet - low sodium heart healthy   Complete by:  As directed    Diet Carb Modified   Complete by:  As directed    Discharge instructions   Complete by:  As directed    It is VERY IMPORTANT that you follow up with a PCP on a regular basis.   Check your blood glucoses before each meal and at bedtime and maintain a log of your readings.  Bring this log with you when you follow up with your PCP so that he or she can adjust your insulin at your follow up visit.   Increase activity slowly   Complete by:  As directed      Allergies as of 12/27/2016      Reactions   Oxycodone Itching   Codeine Itching   Morphine And Related Itching      Medication List    STOP taking these medications   ALEVE 220 MG tablet Generic drug:  naproxen sodium   TRESIBA FLEXTOUCH 100 UNIT/ML Sopn FlexTouch Pen Generic drug:  insulin degludec     TAKE these medications   acetaminophen 325 MG tablet Commonly known as:  TYLENOL Take 2 tablets (650 mg total) by  mouth every 4 (four) hours as needed for headache or mild pain. Notes to patient:  As needed for pain    amLODipine 5 MG tablet Commonly known as:  NORVASC Take 5 mg by mouth daily.   aspirin 81 MG chewable tablet Chew 1 tablet (81 mg total) by mouth daily. Start taking on:  12/28/2016 Notes to patient:  Prevents clotting in stent and heart attack   atorvastatin 80 MG tablet Commonly known as:  LIPITOR Take 1 tablet (80 mg total) by mouth daily at 6 PM. Notes to patient:  Lowers cholesterol    Fish Oil 1000 MG Caps Take 1,000 mg by mouth daily. Notes to patient:  Lowers triglycerides    insulin aspart protamine - aspart (70-30) 100 UNIT/ML FlexPen Commonly known as:  NOVOLOG 70/30 MIX Inject 0.2 mLs (20 Units total) into the skin 2 (two) times daily with a meal. Dispense insulin needles/supplies for use with this product Notes to patient:  Lowers blood sugar    losartan-hydrochlorothiazide 100-12.5 MG tablet Commonly known as:  HYZAAR Take 1 tablet by mouth daily before breakfast. Notes to patient:  Lowers blood pressure  Weak fluid pill    nitroGLYCERIN 0.4 MG SL tablet Commonly known as:  NITROSTAT Place 1 tablet (0.4 mg total) under the tongue every 5 (five) minutes as  needed for chest pain. Notes to patient:  As needed for chest pain    RESTASIS 0.05 % ophthalmic emulsion Generic drug:  cycloSPORINE Place 1 drop 2 (two) times daily into both eyes.   ticagrelor 90 MG Tabs tablet Commonly known as:  BRILINTA Take 1 tablet (90 mg total) by mouth 2 (two) times daily. Notes to patient:  Prevents clotting in stent and heart attack      Follow-up Information    Charlie Pitter, PA-C Follow up on 01/11/2017.   Specialties:  Cardiology, Radiology Why:  at 9:30am for your follow up appt.  Contact information: [address] [place] [address] [phone]        Delrae Rend, MD. Schedule an appointment as soon as possible for a visit in 1 week(s).   Specialty:  Endocrinology Why:  Uncontrolled DM Contact information: [address] [place] [address] [place] [address] [phone]            Time coordinating discharge: 35 minutes.  SignedMargreta Journey Elaya Droege  Pager [phone] [facility] 12/27/2016, 4:55 PM

## 2016-12-27 NOTE — Progress Notes (Signed)
Progress Note  Patient Name: Matthew [name] Date of Encounter: 12/27/2016  Primary Cardiologist: Angelena Form  Subjective   No chest pain or dyspnea this am  Inpatient Medications    Scheduled Meds: . aspirin  81 mg Oral Daily  . atorvastatin  80 mg Oral q1800  . cycloSPORINE  1 drop Both Eyes BID  . insulin aspart  0-15 Units Subcutaneous TID WC  . insulin aspart  0-5 Units Subcutaneous QHS  . insulin aspart  4 Units Subcutaneous TID WC  . living well with diabetes book   Does not apply Once  . metoprolol succinate  12.5 mg Oral Daily  . sodium chloride flush  3 mL Intravenous Q12H  . ticagrelor  90 mg Oral BID   Continuous Infusions: . sodium chloride     PRN Meds: sodium chloride, acetaminophen, ALPRAZolam, diazepam, fentaNYL (SUBLIMAZE) injection, nitroGLYCERIN, ondansetron (ZOFRAN) IV, sodium chloride flush   Vital Signs    Vitals:   12/27/16 0205 12/27/16 0530 12/27/16 0729 12/27/16 0730  BP: 131/69 (!) 162/78 (!) 156/68 (!) 151/130  Pulse: (!) 56 60 62   Resp: 14 17  14   Temp:  98.5 F (36.9 C) 98.7 F (37.1 C)   TempSrc:  Oral Oral   SpO2: 98% 97% 98%   Weight:  182 lb 15.7 oz (83 kg)    Height:        Intake/Output Summary (Last 24 hours) at 12/27/2016 0919 Last data filed at 12/27/2016 6440 Gross per 24 hour  Intake 1063 ml  Output -  Net 1063 ml   Filed Weights   12/25/16 0404 12/26/16 0600 12/27/16 0530  Weight: 183 lb 1.6 oz (83.1 kg) 183 lb 3.2 oz (83.1 kg) 182 lb 15.7 oz (83 kg)    Telemetry     Sinus- Personally Reviewed  ECG     Sinus brady,RBB- Personally Reviewed  Physical Exam    General: Well developed, well nourished, NAD  HEENT: OP clear, mucus membranes moist  SKIN: warm, dry. No rashes. Neuro: No focal deficits  Musculoskeletal: Muscle strength 5/5 all ext  Psychiatric: Mood and affect normal  Neck: No JVD, no carotid bruits, no thyromegaly, no lymphadenopathy.  Lungs:Clear bilaterally, no wheezes, rhonci,  crackles Cardiovascular: Regular rate and rhythm. No murmurs, gallops or rubs. Abdomen:Soft. Bowel sounds present. Non-tender.  Extremities: No lower extremity edema. Pulses are 2 + in the bilateral DP/PT.   Labs    Chemistry Recent Labs  Lab 12/25/16 0357 12/26/16 0259 12/27/16 0518  NA 139 137 137  K 3.6 3.3* 3.7  CL 106 105 105  CO2 25 25 25   GLUCOSE 117* 142* 169*  BUN 18 15 15   CREATININE 1.05 1.01 1.05  CALCIUM 8.8* 8.6* 8.7*  GFRNONAA >60 >60 >60  GFRAA >60 >60 >60  ANIONGAP 8 7 7      Hematology Recent Labs  Lab 12/25/16 0357 12/26/16 0259 12/27/16 0518  WBC 6.0 7.2 5.9  RBC 4.17* 4.16* 4.07*  HGB 12.6* 12.7* 12.4*  HCT 37.5* 37.3* 36.4*  MCV 89.9 89.7 89.4  MCH 30.2 30.5 30.5  MCHC 33.6 34.0 34.1  RDW 12.6 12.9 12.7  PLT 202 179 177    Cardiac Enzymes Recent Labs  Lab 12/24/16 0100 12/24/16 0634 12/25/16 1852 12/26/16 0259  TROPONINI 0.27* 1.76* 0.28* 0.71*    Recent Labs  Lab 12/23/16 2210  TROPIPOC 0.00     BNPNo results for input(s): BNP, PROBNP in the last 168 hours.   DDimer No results for  input(s): DDIMER in the last 168 hours.   Radiology    No results found.  Cardiac Studies   Cardiac cath 12/25/16: Diagnostic Diagram       Post-Intervention Diagram        Post-Intervention Diagram        Echo 12/26/16: - Left ventricle: The cavity size was normal. Wall thickness was   normal. Systolic function was normal. The estimated ejection   fraction was in the range of 60% to 65%. Wall motion was normal;   there were no regional wall motion abnormalities. Left   ventricular diastolic function parameters were normal. - Mitral valve: Calcified annulus. - Atrial septum: No defect or patent foramen ovale was identified. - Pulmonary arteries: PA peak pressure: 36 mm Hg (S).  Patient Profile     76 y.o. male with history of DM and HTN admitted with a NSTEMI. PCI of LAD 12/25/16 and staged PCI of the RCA on 12/26/16.     Assessment & Plan    1. CAD/NSTEMI: He was admitted with chest pain and NSTEMI. He had a cardiac cath on 12/25/16 and was found to have a severe stenosis in the mid LAD treated with 3 drug eluting stents. Staged PCI of the RCA on 12/26/16 and had a drug eluting stent placed in the mid RCA and balloon angioplasty of the PLA lesion, no stent placed. Echo with normal LV function. He is doing well this am.  He can be discharged home. WE will arrange f/u in my office.   2. HTN: Restart home dose of Norvasc and Hyzaar  For questions or updates, please contact [place] Please consult [email or web address] for contact info under Cardiology/STEMI.      Signed, Lauree Chandler, MD  12/27/2016, 9:19 AM

## 2016-12-27 NOTE — Care Management Important Message (Signed)
Important Message  Patient Details  Name: Matthew [name] MRN: [id number] Date of Birth: 1940/07/14   Medicare Important Message Given:  Yes    Shane Melby Montine Circle 12/27/2016, 1:45 PM

## 2017-01-03 ENCOUNTER — Telehealth: Payer: Self-pay | Admitting: Cardiovascular Disease

## 2017-01-03 NOTE — Telephone Encounter (Signed)
If he is having an active GI bleed, he will need to go to the ED and get a CBC and inpatient GI evaluation. His Brilinta is important given his recent coronary stents.   Lauree Chandler

## 2017-01-03 NOTE — Telephone Encounter (Signed)
Returned the call to the patient. He stated that he recently has stents placed (11/20) and was started on Brilinta. Since he has been on the Brilinta he has had thick, black, tarry stools. He denies any other symptoms.  He had an office visit with his PCP. He stated that his PCP started him on pantoprazole 40 mg for a possible ulcer. He was scheduled to have an endoscopy yesterday morning. Once they found out that he recently had stents placed, they refused to proceed.   Will route this message to pharmD and Melina Copa, PA for their recommendation. The patient has an appointment with Melina Copa, PA on 12/6. He stated that he is a patient of Dr. Angelena Form (not Dr. Claiborne Billings).

## 2017-01-03 NOTE — Telephone Encounter (Signed)
Follow up    Patient returning call. Please call again.

## 2017-01-03 NOTE — Telephone Encounter (Signed)
I advised patient that if he is continuing to have active GI bleeding he should go to ED to be evaluated. Patient stated that he will continue to monitor and if symptoms don't improve he understands to go to the ED. I informed him how important it is to take V[place] with his recent coronary stents. Patient verbalized understanding and thanked me for the call.

## 2017-01-03 NOTE — Telephone Encounter (Signed)
Left a message to call back.

## 2017-01-03 NOTE — Telephone Encounter (Signed)
New Message  Pt c/o medication issue:  1. Name of Medication: Brilinta   2. How are you currently taking this medication (dosage and times per day)? 90mg    3. Are you having a reaction (difficulty breathing--STAT)? Stool is black   4. What is your medication issue? Please call pt back to discuss

## 2017-01-03 NOTE — Telephone Encounter (Signed)
This needs to be addressed by MD, will route.

## 2017-01-04 NOTE — Telephone Encounter (Signed)
Agree with Dr. Camillia Herter recommendation - make sure patient knows bleeding is NOT normal even if he is on a blood thinner. Needs to see ED or at bare minimum PCP ASAP for blood count and discuss further eval.  Melina Copa PA-C

## 2017-01-06 DIAGNOSIS — Z8711 Personal history of peptic ulcer disease: Secondary | ICD-10-CM

## 2017-01-06 HISTORY — DX: Personal history of peptic ulcer disease: Z87.11

## 2017-01-09 ENCOUNTER — Encounter: Payer: Self-pay | Admitting: Physician Assistant

## 2017-01-09 DIAGNOSIS — I2510 Atherosclerotic heart disease of native coronary artery without angina pectoris: Secondary | ICD-10-CM | POA: Insufficient documentation

## 2017-01-09 DIAGNOSIS — Z9861 Coronary angioplasty status: Secondary | ICD-10-CM

## 2017-01-09 DIAGNOSIS — E782 Mixed hyperlipidemia: Secondary | ICD-10-CM | POA: Insufficient documentation

## 2017-01-09 DIAGNOSIS — E785 Hyperlipidemia, unspecified: Secondary | ICD-10-CM | POA: Insufficient documentation

## 2017-01-09 NOTE — Progress Notes (Signed)
Cardiology Office Note    Date:  01/11/2017  ID:  Matthew [name], DOB May 10, 1940, MRN [id number] PCP:  Wenda Low, MD  Cardiologist:  Dr. Angelena Form   Chief Complaint: f/u MI  History of Present Illness:  Matthew [name] is a 76 y.o. male with history of type 2 diabetes, hypertension, CKD II, kidney stones, basal cell of the nose, CAD/NSTEMI s/p stenting of LAD/RCA and PTCA of PL branch 12/2016, RBBB, hyperlipidemia who presents for post-hospital follow-up. He was recently admitted with chest pressure as well as radiation into his right arm and ruled in for MI. He was treated with NTG infusion but developed hypotension with this. Troponin peak was 1.76. His first cath was on 12/25/16 resulting in PTCA/DES x3 to prox and mid LAD with severe stenosis in a diagonal branch, too small for PCI, and severe stenosis of prox RCA and PL branch with plans for staged PCI. He did have chest pain post-procedurally treated with analgesics and NTG. Repeat cath 12/26/16 showed patent LAD stents, 50% narrowing in the LAD beyond first stent, and ultimately had PTCA of the PLA and DES to the mRCA. It was recommended he continue with DAPT for a minimum of 1 year. 2D echo 12/26/16: EF 06-30, normal diastolic function, PASP 36. Other pertinent labs include Hgb 15.6 on admission->12.4 (previously 12-14), Cr 1.05, K 3.3->4.0, LDL 113, no recent LFTs.  He returns for follow-up today with his daughter reporting recurrent angina. He actually called the office 01/03/17 at which time he reported thick black stools. He was advised to go to the ED. He reports his PCP started him on Protonix 40mg  BID. He reports he saw [facility] and was set up for endoscopy but he was told by anesthesiology that they would not touch him for 6 months since he was on [place]. Instead of going to ED he preferred to f/u in the office today. He reports that he had felt generally well for 3-4 days after discharge but then began having recurrent chest  pressure, bilateral arm numbness and dyspnea any time he exerts himself. He also has had generalized exhaustion. He states his stools are now less dark than they were before, but still intermittently dark. He does not have any discomfort at rest.   Past Medical History:  Diagnosis Date  . Arthritis    Shoulder, knees, back  . Basal cell carcinoma    bridge of nose- basal cell  . CAD in native artery    a. CAD/NSTEMI s/p stenting of LAD then staged stenting to RCA and PTCA of PL branch 12/2016, EF 60-65%.  . Cataract    right  . CKD (chronic kidney disease), stage II    Stage II, per Dr. 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 Complication of anesthesia    "Too much with shoulder surgery", pt. reports that he was told the at they "lost him, due to absorbing too much anesthesia". Not enough for  Right ,  Left knee  just right.  . Constipation    occ  . Diabetes mellitus    type 2 diet controlled  . Dysrhythmia    has been irregular at times since age 63.  Marland Kitchen History of kidney stones   . Hyperlipidemia   . Hypertension   . RBBB     Past Surgical History:  Procedure Laterality Date  . COLONOSCOPY WITH PROPOFOL N/A 06/05/2016   Procedure: COLONOSCOPY WITH PROPOFOL;  Surgeon: Garlan Fair, MD;  Location: [facility];  Service: Endoscopy;  Laterality: N/A;  . CORONARY ANGIOGRAPHY N/A 12/26/2016   Procedure: CORONARY ANGIOGRAPHY;  Surgeon: Troy Sine, MD;  Location: [place] [facility];  Service: Cardiovascular;  Laterality: N/A;  . CORONARY STENT INTERVENTION N/A 12/25/2016   Procedure: CORONARY STENT INTERVENTION;  Surgeon: Burnell Blanks, MD;  Location: [place] [facility];  Service: Cardiovascular;  Laterality: N/A;  . CORONARY STENT INTERVENTION N/A 12/26/2016   Procedure: CORONARY STENT INTERVENTION;  Surgeon: Troy Sine, MD;  Location: [place] [facility];  Service: Cardiovascular;  Laterality: N/A;  . EYE SURGERY     Cataract Left eye  . fisture      x 2 anal fissure  .  HAND SURGERY     x2 for ligament rebuilt  . KNEE SURGERY  2009   Right- first arthroscopy, then partial replacement- [facility]   . LEFT HEART CATH AND CORONARY ANGIOGRAPHY N/A 12/25/2016   Procedure: LEFT HEART CATH AND CORONARY ANGIOGRAPHY;  Surgeon: Burnell Blanks, MD;  Location: [place] [facility];  Service: Cardiovascular;  Laterality: N/A;  . LITHOTRIPSY    . LUMBAR LAMINECTOMY/DECOMPRESSION MICRODISCECTOMY N/A 06/09/2015   Procedure: Laminectomy - T12-L1;  Surgeon: Eustace Moore, MD;  Location: [place] [facility];  Service: Neurosurgery;  Laterality: N/A;  Laminectomy - T12-L1  . MEDIAL PARTIAL KNEE [facility]  2010   [facility] also had arthroscopic suurgery   . POSTERIOR LUMBAR FUSION  02/02/2012  . REVERSE SHOULDER ARTHROPLASTY Left 12/06/2012   Dr Veverly Fells  . SHOULDER SURGERY  1985   L arthroscopic   . TOTAL SHOULDER ARTHROPLASTY Left 12/06/2012   Procedure: LEFT TOTAL SHOULDER ARTHROPLASTY VERSES A REVERSE TOTAL SHOULDER ARTHROPLASTY;  Surgeon: Augustin Schooling, MD;  Location: [place];  Service: Orthopedics;  Laterality: Left;    Current Medications: Current Meds  Medication Sig  . acetaminophen (TYLENOL) 325 MG tablet Take 2 tablets (650 mg total) by mouth every 4 (four) hours as needed for headache or mild pain.  Marland Kitchen amLODipine (NORVASC) 5 MG tablet Take 5 mg by mouth daily.  Marland Kitchen aspirin 81 MG chewable tablet Chew 1 tablet (81 mg total) by mouth daily.  Marland Kitchen atorvastatin (LIPITOR) 80 MG tablet Take 1 tablet (80 mg total) by mouth daily at 6 PM.  . HUMALOG MIX 75/25 KWIKPEN (75-25) 100 UNIT/ML Kwikpen 20 UNITS TWO TIMES A DAY WITH A MEAL SUBCUTANEOUS  . losartan-hydrochlorothiazide (HYZAAR) 100-12.5 MG tablet Take 1 tablet by mouth daily before breakfast.  . nitroGLYCERIN (NITROSTAT) 0.4 MG SL tablet Place 1 tablet (0.4 mg total) under the tongue every 5 (five) minutes as needed for chest pain.  . Omega-3 Fatty Acids (FISH OIL) 1000 MG CAPS Take 1,000  mg by mouth daily.  . pantoprazole (PROTONIX) 40 MG tablet Take 40 mg by mouth 2 (two) times daily.  . RESTASIS 0.05 % ophthalmic emulsion Place 1 drop 2 (two) times daily into both eyes.  . ticagrelor (BRILINTA) 90 MG TABS tablet Take 1 tablet (90 mg total) by mouth 2 (two) times daily.  . [DISCONTINUED] insulin aspart protamine - aspart (NOVOLOG 70/30 MIX) (70-30) 100 UNIT/ML FlexPen Inject 0.2 mLs (20 Units total) into the skin 2 (two) times daily with a meal. Dispense insulin needles/supplies for use with this product     Allergies:   Oxycodone; Codeine; and Morphine and related   Social History   Socioeconomic History  . Marital status: Married    Spouse name: None  . Number of children: None  .  Years of education: None  . Highest education level: None  Social Needs  . Financial resource strain: None  . Food insecurity - worry: None  . Food insecurity - inability: None  . Transportation needs - medical: None  . Transportation needs - non-medical: None  Occupational History  . None  Tobacco Use  . Smoking status: Never Smoker  . Smokeless tobacco: Never Used  Substance and Sexual Activity  . Alcohol use: No  . Drug use: No  . Sexual activity: Yes  Other Topics Concern  . None  Social History Narrative  . None     Family History:  Family History  Problem Relation Age of Onset  . CAD Father   . CAD Sister      ROS:   Please see the history of present illness. No BRBPR. All other systems are reviewed and otherwise negative.    PHYSICAL EXAM:   VS:  BP 136/74   Pulse 96   Resp 16   Ht 5\' 9"  (1.753 m)   Wt 180 lb 1.9 oz (81.7 kg)   SpO2 98%   BMI 26.60 kg/m   BMI: Body mass index is 26.6 kg/m. GEN: Well nourished, well developed WM, pale, in no acute distress  HEENT: normocephalic, atraumatic Neck: no JVD, carotid bruits, or masses Cardiac: RRR; no murmurs, rubs, or gallops, no edema  Respiratory:  clear to auscultation bilaterally, normal work of  breathing GI: soft, nontender, nondistended, + BS MS: no deformity or atrophy  Skin: warm and dry, no rash Neuro:  Alert and Oriented x 3, Strength and sensation are intact, follows commands Psych: euthymic mood, full affect  Wt Readings from Last 3 Encounters:  01/11/17 180 lb 1.9 oz (81.7 kg)  12/27/16 182 lb 15.7 oz (83 kg)  06/05/16 185 lb (83.9 kg)      Studies/Labs Reviewed:   EKG:  EKG was ordered today and personally reviewed by me and demonstrates NSR 86bpm, RBBB, nonspecific ST-T changes a/w RBBB  Recent Labs: 12/27/2016: BUN 15; Creatinine, Ser 1.05; Hemoglobin 12.4; Platelets 177; Potassium 3.7; Sodium 137   Lipid Panel    Component Value Date/Time   CHOL 163 12/24/2016 0634   TRIG 63 12/24/2016 0634   HDL 37 (L) 12/24/2016 0634   CHOLHDL 4.4 12/24/2016 0634   VLDL 13 12/24/2016 0634   LDLCALC 113 (H) 12/24/2016 5643    Additional studies/ records that were reviewed today include: Summarized above.    ASSESSMENT & PLAN:   1. CAD/NSTEMI with recurrent unstable angina - I am concerned about his recurrent anginal symptoms which began shortly after discharge, particularly in the setting of melena for over 1 week. He had endoscopy planned but this was cancelled due to being on ASA/Brilinta. Instead of going to ED as recommended, he preferred to wait until f/u in the office today. He continues to feel poorly with any exertion. There is concern for symptomatic anemia contributing. I d/w Dr. Angelena Form. Will plan to admit and check labs including Hgb and troponins. May need re-look study if anemia is not significant. Risks and benefits of cardiac catheterization have been discussed with the patient. These include bleeding, infection, kidney damage, stroke, heart attack, death. The patient understands these risks and is willing to proceed, if that is what is decided. Hold off heparin given #2. Dr. Angelena Form also saw and examined the patient with me. 2. Melena - see above.  Check stat CBC upon arrival to hospital. Continue PPI. May need to reconsult  GI contingent on results- saw [facility] recently. Discussed life-threatening nature of evidence of GIB and asked him not to wait in the future; to seek care in ED when asked to do so. 3. HTN - controlled, follow BP with hospital course. Not clear why he didn't go home on metoprolol but will follow - HR in the hospital previously was in the 50s. It's now 80s-90s which could be explained by anemia if present, so will follow before re-adding beta blocker. 4. Hyperlidemia - continue statin. 5. Diabetes mellitus - will add SSI for now, but plan to resume standing insulin once it is known that he will not require any more procedures.  Disposition: F/u TBD based on hospital plan.   Medication Adjustments/Labs and Tests Ordered: Current medicines are reviewed at length with the patient today.  Concerns regarding medicines are outlined above. Medication changes, Labs and Tests ordered today are summarized above and listed in the Patient Instructions accessible in Encounters.   Signed, Charlie Pitter, PA-C  01/11/2017 10:01 AM    [address], [place], [place]  [address] Phone: [phone]; Fax: [phone]

## 2017-01-11 ENCOUNTER — Observation Stay (HOSPITAL_COMMUNITY)
Admission: EM | Admit: 2017-01-11 | Discharge: 2017-01-13 | Disposition: A | Discharge status: Home / Self Care | Payer: Medicare Other | Attending: Cardiovascular Disease | Admitting: Cardiovascular Disease

## 2017-01-11 ENCOUNTER — Encounter: Payer: Self-pay | Admitting: Physician Assistant

## 2017-01-11 ENCOUNTER — Ambulatory Visit: Payer: Self-pay | Admitting: Physician Assistant

## 2017-01-11 ENCOUNTER — Other Ambulatory Visit: Payer: Self-pay | Admitting: Physician Assistant

## 2017-01-11 ENCOUNTER — Emergency Department (HOSPITAL_COMMUNITY): Payer: Medicare Other

## 2017-01-11 ENCOUNTER — Encounter (HOSPITAL_COMMUNITY): Payer: Self-pay | Admitting: *Deleted

## 2017-01-11 ENCOUNTER — Other Ambulatory Visit: Payer: Self-pay

## 2017-01-11 VITALS — BP 136/74 | HR 96 | Resp 16 | Ht 69.0 in | Wt 180.1 lb

## 2017-01-11 DIAGNOSIS — Z8249 Family history of ischemic heart disease and other diseases of the circulatory system: Secondary | ICD-10-CM | POA: Diagnosis not present

## 2017-01-11 DIAGNOSIS — Z794 Long term (current) use of insulin: Secondary | ICD-10-CM | POA: Insufficient documentation

## 2017-01-11 DIAGNOSIS — I129 Hypertensive chronic kidney disease with stage 1 through stage 4 chronic kidney disease, or unspecified chronic kidney disease: Secondary | ICD-10-CM | POA: Diagnosis not present

## 2017-01-11 DIAGNOSIS — E785 Hyperlipidemia, unspecified: Secondary | ICD-10-CM

## 2017-01-11 DIAGNOSIS — Z96612 Presence of left artificial shoulder joint: Secondary | ICD-10-CM | POA: Diagnosis not present

## 2017-01-11 DIAGNOSIS — E119 Type 2 diabetes mellitus without complications: Secondary | ICD-10-CM

## 2017-01-11 DIAGNOSIS — K259 Gastric ulcer, unspecified as acute or chronic, without hemorrhage or perforation: Secondary | ICD-10-CM | POA: Diagnosis not present

## 2017-01-11 DIAGNOSIS — D631 Anemia in chronic kidney disease: Secondary | ICD-10-CM | POA: Insufficient documentation

## 2017-01-11 DIAGNOSIS — Z96652 Presence of left artificial knee joint: Secondary | ICD-10-CM | POA: Insufficient documentation

## 2017-01-11 DIAGNOSIS — K921 Melena: Principal | ICD-10-CM | POA: Insufficient documentation

## 2017-01-11 DIAGNOSIS — K279 Peptic ulcer, site unspecified, unspecified as acute or chronic, without hemorrhage or perforation: Secondary | ICD-10-CM | POA: Diagnosis present

## 2017-01-11 DIAGNOSIS — N182 Chronic kidney disease, stage 2 (mild): Secondary | ICD-10-CM | POA: Diagnosis not present

## 2017-01-11 DIAGNOSIS — I700 Atherosclerosis of aorta: Secondary | ICD-10-CM | POA: Insufficient documentation

## 2017-01-11 DIAGNOSIS — N179 Acute kidney failure, unspecified: Secondary | ICD-10-CM | POA: Diagnosis not present

## 2017-01-11 DIAGNOSIS — E1136 Type 2 diabetes mellitus with diabetic cataract: Secondary | ICD-10-CM | POA: Insufficient documentation

## 2017-01-11 DIAGNOSIS — I25110 Atherosclerotic heart disease of native coronary artery with unstable angina pectoris: Secondary | ICD-10-CM | POA: Insufficient documentation

## 2017-01-11 DIAGNOSIS — R079 Chest pain, unspecified: Secondary | ICD-10-CM | POA: Diagnosis present

## 2017-01-11 DIAGNOSIS — E1122 Type 2 diabetes mellitus with diabetic chronic kidney disease: Secondary | ICD-10-CM | POA: Insufficient documentation

## 2017-01-11 DIAGNOSIS — E782 Mixed hyperlipidemia: Secondary | ICD-10-CM | POA: Diagnosis present

## 2017-01-11 DIAGNOSIS — Z79899 Other long term (current) drug therapy: Secondary | ICD-10-CM | POA: Insufficient documentation

## 2017-01-11 DIAGNOSIS — Z7982 Long term (current) use of aspirin: Secondary | ICD-10-CM | POA: Diagnosis not present

## 2017-01-11 DIAGNOSIS — I10 Essential (primary) hypertension: Secondary | ICD-10-CM | POA: Diagnosis present

## 2017-01-11 DIAGNOSIS — I2510 Atherosclerotic heart disease of native coronary artery without angina pectoris: Secondary | ICD-10-CM

## 2017-01-11 DIAGNOSIS — Z955 Presence of coronary angioplasty implant and graft: Secondary | ICD-10-CM | POA: Diagnosis not present

## 2017-01-11 DIAGNOSIS — Z85820 Personal history of malignant melanoma of skin: Secondary | ICD-10-CM | POA: Diagnosis not present

## 2017-01-11 DIAGNOSIS — I4510 Unspecified right bundle-branch block: Secondary | ICD-10-CM | POA: Insufficient documentation

## 2017-01-11 DIAGNOSIS — I252 Old myocardial infarction: Secondary | ICD-10-CM | POA: Diagnosis not present

## 2017-01-11 DIAGNOSIS — Z9861 Coronary angioplasty status: Secondary | ICD-10-CM

## 2017-01-11 DIAGNOSIS — E1159 Type 2 diabetes mellitus with other circulatory complications: Secondary | ICD-10-CM

## 2017-01-11 DIAGNOSIS — R0789 Other chest pain: Secondary | ICD-10-CM

## 2017-01-11 DIAGNOSIS — I200 Unstable angina: Secondary | ICD-10-CM | POA: Diagnosis present

## 2017-01-11 HISTORY — DX: Low back pain, unspecified: M54.50

## 2017-01-11 HISTORY — DX: Other chronic pain: G89.29

## 2017-01-11 HISTORY — DX: Low back pain: M54.5

## 2017-01-11 HISTORY — DX: Acute myocardial infarction, unspecified: I21.9

## 2017-01-11 LAB — TYPE AND SCREEN
ABO/RH(D): O POS
Antibody Screen: NEGATIVE

## 2017-01-11 LAB — CBC
HCT: 33.7 % — ABNORMAL LOW (ref 39.0–52.0)
Hemoglobin: 11.3 g/dL — ABNORMAL LOW (ref 13.0–17.0)
MCH: 30.1 pg (ref 26.0–34.0)
MCHC: 33.5 g/dL (ref 30.0–36.0)
MCV: 89.6 fL (ref 78.0–100.0)
Platelets: 335 10*3/uL (ref 150–400)
RBC: 3.76 MIL/uL — ABNORMAL LOW (ref 4.22–5.81)
RDW: 13.3 % (ref 11.5–15.5)
WBC: 7.9 10*3/uL (ref 4.0–10.5)

## 2017-01-11 LAB — PROTIME-INR
INR: 1
Prothrombin Time: 13.1 seconds (ref 11.4–15.2)

## 2017-01-11 LAB — BASIC METABOLIC PANEL
Anion gap: 11 (ref 5–15)
BUN: 31 mg/dL — ABNORMAL HIGH (ref 6–20)
CO2: 25 mmol/L (ref 22–32)
Calcium: 9.6 mg/dL (ref 8.9–10.3)
Chloride: 100 mmol/L — ABNORMAL LOW (ref 101–111)
Creatinine, Ser: 1.48 mg/dL — ABNORMAL HIGH (ref 0.61–1.24)
GFR calc Af Amer: 52 mL/min — ABNORMAL LOW (ref >60)
GFR calc non Af Amer: 45 mL/min — ABNORMAL LOW (ref >60)
Glucose, Bld: 232 mg/dL — ABNORMAL HIGH (ref 65–99)
Potassium: 3.5 mmol/L (ref 3.5–5.1)
Sodium: 136 mmol/L (ref 135–145)

## 2017-01-11 LAB — TROPONIN I
Troponin I: <0.03 ng/mL (ref <0.03)
Troponin I: <0.03 ng/mL (ref <0.03)
Troponin I: <0.03 ng/mL (ref <0.03)

## 2017-01-11 LAB — CBG MONITORING, ED: Glucose-Capillary: 115 mg/dL — ABNORMAL HIGH (ref 65–99)

## 2017-01-11 LAB — GLUCOSE, CAPILLARY: Glucose-Capillary: 124 mg/dL — ABNORMAL HIGH (ref 65–99)

## 2017-01-11 MED ORDER — ONDANSETRON HCL 4 MG/2ML IJ SOLN: 4.0000 mg | Freq: Four times a day (QID) | INTRAMUSCULAR | Status: DC | PRN

## 2017-01-11 MED ORDER — SODIUM CHLORIDE 0.9% FLUSH
3.0000 mL | Freq: Two times a day (BID) | INTRAVENOUS | Status: DC
  Administered 2017-01-11 – 2017-01-12 (×2): 3 mL via INTRAVENOUS

## 2017-01-11 MED ORDER — ASPIRIN EC 81 MG PO TBEC
81.0000 mg | DELAYED_RELEASE_TABLET | Freq: Every day | ORAL | Status: DC
  Administered 2017-01-12 – 2017-01-13 (×2): 81 mg via ORAL
  Filled 2017-01-11 (×2): qty 1

## 2017-01-11 MED ORDER — PANTOPRAZOLE SODIUM 40 MG PO TBEC
40.0000 mg | DELAYED_RELEASE_TABLET | Freq: Two times a day (BID) | ORAL | Status: DC
  Administered 2017-01-11 – 2017-01-12 (×2): 40 mg via ORAL
  Filled 2017-01-11 (×2): qty 1

## 2017-01-11 MED ORDER — TICAGRELOR 90 MG PO TABS
90.0000 mg | ORAL_TABLET | Freq: Two times a day (BID) | ORAL | Status: DC
  Administered 2017-01-11 – 2017-01-13 (×4): 90 mg via ORAL
  Filled 2017-01-11 (×4): qty 1

## 2017-01-11 MED ORDER — ACETAMINOPHEN 325 MG PO TABS
650.0000 mg | ORAL_TABLET | ORAL | Status: DC | PRN
  Administered 2017-01-11: 650 mg via ORAL
  Filled 2017-01-11: qty 2

## 2017-01-11 MED ORDER — CYCLOSPORINE 0.05 % OP EMUL
1.0000 [drp] | Freq: Two times a day (BID) | OPHTHALMIC | Status: DC
  Administered 2017-01-11 – 2017-01-13 (×4): 1 [drp] via OPHTHALMIC
  Filled 2017-01-11 (×5): qty 1

## 2017-01-11 MED ORDER — NITROGLYCERIN 0.4 MG SL SUBL
0.4000 mg | SUBLINGUAL_TABLET | SUBLINGUAL | Status: DC | PRN
  Administered 2017-01-11: 0.4 mg via SUBLINGUAL
  Filled 2017-01-11: qty 1

## 2017-01-11 MED ORDER — AMLODIPINE BESYLATE 5 MG PO TABS
5.0000 mg | ORAL_TABLET | Freq: Every day | ORAL | Status: DC
  Administered 2017-01-11 – 2017-01-12 (×2): 5 mg via ORAL
  Filled 2017-01-11 (×2): qty 1

## 2017-01-11 MED ORDER — ATORVASTATIN CALCIUM 80 MG PO TABS
80.0000 mg | ORAL_TABLET | Freq: Every day | ORAL | Status: DC
  Administered 2017-01-11 – 2017-01-13 (×3): 80 mg via ORAL
  Filled 2017-01-11 (×3): qty 1

## 2017-01-11 MED ORDER — INSULIN ASPART 100 UNIT/ML ~~LOC~~ SOLN
0.0000 [IU] | Freq: Three times a day (TID) | SUBCUTANEOUS | Status: DC
  Administered 2017-01-13: 2 [IU] via SUBCUTANEOUS

## 2017-01-11 MED ORDER — SODIUM CHLORIDE 0.9% FLUSH: 3.0000 mL | INTRAVENOUS | Status: DC | PRN

## 2017-01-11 MED ORDER — SODIUM CHLORIDE 0.9 % IV SOLN: 250.0000 mL | INTRAVENOUS | Status: DC | PRN

## 2017-01-11 NOTE — Progress Notes (Signed)
Labs reviewed with Dr. Angelena Form. Hgb 11.3, ?BUN/Cr indicative of AKI.   Prior baseline Hgb at time of last admission was around 15. When he was discharged it was 12.4. Outpatient primary care records from [place] indicate outpatient Hgb of 14.3 on 01/01/17, so this is a 3gm drop since that time. Will continue Protonix and request GI assistance (spoke with Trish who will call [facility]). Patient unable to interrupt DAPT given multiple recent stents. Initial troponin was negative so we are holding off heparin. We do not plan to re-cath until plan for GI eval is known. Will also hold losartan/HCTZ given AKI.  Corwyn Vora PA-C

## 2017-01-11 NOTE — Care Management Obs Status (Signed)
[facility] NOTIFICATION   Patient Details  Name: Matthew [name] MRN: [id number] Date of Birth: 1940/07/21   Medicare Observation Status Notification Given:  Yes    Apolonio Schneiders, RN 01/11/2017, 6:26 PM

## 2017-01-11 NOTE — ED Provider Notes (Addendum)
[facility] EMERGENCY DEPARTMENT Provider Note   CSN: [id number] Arrival date & time: 01/11/17  1032     History   Chief Complaint Chief Complaint  Patient presents with  . Chest Pain    HPI Sulayman Manning is a 76 y.o. male.  HPI  Patient with multiple medical issues including CAD presents with new dyspnea and chest pain on exertion. Patient acknowledges history of prior stents, states that he has been taking all medication as directed including Brilinta and aspirin. Over the past 2 days patient has had inability to perform previously normal activities of daily living secondary to fatigue and chest pain with exertion. At rest negligible symptoms are present. No confusion, no disorientation, no fever, no chills.   Past Medical History:  Diagnosis Date  . Arthritis    Shoulder, knees, back  . Basal cell carcinoma    bridge of nose- basal cell  . CAD in native artery    a. CAD/NSTEMI s/p stenting of LAD then staged stenting to RCA and PTCA of PL branch 12/2016, EF 60-65%.  . Cataract    right  . CKD (chronic kidney disease), stage II    Stage II, per Dr. 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 Complication of anesthesia    "Too much with shoulder surgery", pt. reports that he was told the at they "lost him, due to absorbing too much anesthesia". Not enough for  Right ,  Left knee  just right.  . Constipation    occ  . Diabetes mellitus    type 2 diet controlled  . Dysrhythmia    has been irregular at times since age 26.  Marland Kitchen History of kidney stones   . Hyperlipidemia   . Hypertension   . RBBB     Patient Active Problem List   Diagnosis Date Noted  . CAD in native artery 01/09/2017  . Hyperlipidemia 01/09/2017  . DM2 (diabetes mellitus, type 2) ([place]) 12/24/2016  . HTN (hypertension) 12/24/2016  . Chest pain, rule out acute myocardial infarction 12/24/2016  . ACS (acute coronary syndrome) ([place])   . Non-ST elevation (NSTEMI) myocardial infarction ([place])   .  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 COLONOSCOPY WITH PROPOFOL N/A 06/05/2016   Procedure: COLONOSCOPY WITH PROPOFOL;  Surgeon: Garlan Fair, MD;  [facility];  Service: Endoscopy;  Laterality: N/A;  . CORONARY ANGIOGRAPHY N/A 12/26/2016   Procedure: CORONARY ANGIOGRAPHY;  Surgeon: Troy Sine, MD;  Location: [place] [facility];  Service: Cardiovascular;  Laterality: N/A;  . CORONARY STENT INTERVENTION N/A 12/25/2016   Procedure: CORONARY STENT INTERVENTION;  Surgeon: Burnell Blanks, MD;  Location: [place] [facility];  Service: Cardiovascular;  Laterality: N/A;  . CORONARY STENT INTERVENTION N/A 12/26/2016   Procedure: CORONARY STENT INTERVENTION;  Surgeon: Troy Sine, MD;  Location: [place] [place] [facility];  Service: Cardiovascular;  Laterality: N/A;  . EYE SURGERY     Cataract Left eye  . fisture      x 2 anal fissure  . HAND SURGERY     x2 for ligament rebuilt  . KNEE SURGERY  2009   Right- first arthroscopy, then partial replacement- [facility]   . LEFT HEART CATH AND CORONARY ANGIOGRAPHY N/A 12/25/2016   Procedure: LEFT HEART CATH AND CORONARY ANGIOGRAPHY;  Surgeon: Burnell Blanks, MD;  Location: [place] [facility];  Service: Cardiovascular;  Laterality: N/A;  . LITHOTRIPSY    . LUMBAR LAMINECTOMY/DECOMPRESSION  MICRODISCECTOMY N/A 06/09/2015   Procedure: Laminectomy - T12-L1;  Surgeon: Eustace Moore, MD;  Location: [place] [facility];  Service: Neurosurgery;  Laterality: N/A;  Laminectomy - T12-L1  . MEDIAL PARTIAL KNEE REPLACEMENT  2010   [facility] to [facility] also had arthroscopic suurgery   . POSTERIOR LUMBAR FUSION  02/02/2012  . REVERSE SHOULDER ARTHROPLASTY Left 12/06/2012   Dr Veverly Fells  . SHOULDER SURGERY  1985   L arthroscopic   . TOTAL SHOULDER ARTHROPLASTY Left 12/06/2012   Procedure: LEFT TOTAL SHOULDER ARTHROPLASTY VERSES A REVERSE TOTAL SHOULDER ARTHROPLASTY;  Surgeon:  Augustin Schooling, MD;  Location: [facility];  Service: Orthopedics;  Laterality: Left;       Home Medications    Prior to Admission medications   Medication Sig Start Date End Date Taking? Authorizing Provider  acetaminophen (TYLENOL) 325 MG tablet Take 2 tablets (650 mg total) by mouth every 4 (four) hours as needed for headache or mild pain. 12/27/16   Rama, Venetia Maxon, MD  amLODipine (NORVASC) 5 MG tablet Take 5 mg by mouth daily.    [provider]  aspirin 81 MG chewable tablet Chew 1 tablet (81 mg total) by mouth daily. 12/28/16   Rama, Venetia Maxon, MD  atorvastatin (LIPITOR) 80 MG tablet Take 1 tablet (80 mg total) by mouth daily at 6 PM. 12/27/16   Rama, Venetia Maxon, MD  HUMALOG MIX 75/25 KWIKPEN (75-25) 100 UNIT/ML Kwikpen 20 UNITS TWO TIMES A DAY WITH A MEAL SUBCUTANEOUS 12/27/16   [provider]  losartan-hydrochlorothiazide (HYZAAR) 100-12.5 MG tablet Take 1 tablet by mouth daily before breakfast. 12/27/16   Rama, Venetia Maxon, MD  nitroGLYCERIN (NITROSTAT) 0.4 MG SL tablet Place 1 tablet (0.4 mg total) under the tongue every 5 (five) minutes as needed for chest pain. 12/27/16   Rama, Venetia Maxon, MD  Omega-3 Fatty Acids (FISH OIL) 1000 MG CAPS Take 1,000 mg by mouth daily.    [provider]  pantoprazole (PROTONIX) 40 MG tablet Take 40 mg by mouth 2 (two) times daily. 01/01/17   [provider]  RESTASIS 0.05 % ophthalmic emulsion Place 1 drop 2 (two) times daily into both eyes. 10/25/16   [provider]  ticagrelor (BRILINTA) 90 MG TABS tablet Take 1 tablet (90 mg total) by mouth 2 (two) times daily. 12/27/16   Cheryln Manly, NP    Family History Family History  Problem Relation Age of Onset  . CAD Father   . CAD Sister     Social History Social History   Tobacco Use  . Smoking status: Never Smoker  . Smokeless tobacco: Never Used  Substance Use Topics  . Alcohol use: No  . Drug use: No     Allergies   Oxycodone;  Codeine; and Morphine and related   Review of Systems Review of Systems  Constitutional:       Per HPI, otherwise negative  HENT:       Per HPI, otherwise negative  Respiratory:       Per HPI, otherwise negative  Cardiovascular:       Per HPI, otherwise negative  Gastrointestinal: Negative for vomiting.       LATE NOTE: PATIENT DID ENDORSE DARK STOOL, DENIES ABDOMINAL PAIN, OR FRANK BLOOD, OR BLEEDING WHEN NOT HAVING A BOWEL MOVEMENT.  Endocrine:       Negative aside from HPI  Genitourinary:       Neg aside from HPI   Musculoskeletal:  Per HPI, otherwise negative  Skin: Negative.   Neurological: Positive for weakness. Negative for syncope.     Physical Exam Updated Vital Signs BP (!) 142/79 (BP Location: Left Arm)   Pulse 93   Temp 98.4 F (36.9 C) (Oral)   Resp 16   SpO2 100%   Physical Exam  Constitutional: He is oriented to person, place, and time. He appears well-developed. No distress.  HENT:  Head: Normocephalic and atraumatic.  Eyes: Conjunctivae and EOM are normal.  Cardiovascular: Normal rate and regular rhythm.  Pulmonary/Chest: Effort normal. No stridor. No respiratory distress.  Abdominal: He exhibits no distension.  Musculoskeletal: He exhibits no edema.  Neurological: He is alert and oriented to person, place, and time.  Skin: Skin is warm and dry.  Psychiatric: He has a normal mood and affect.  Nursing note and vitals reviewed.    ED Treatments / Results  Labs (all labs ordered are listed, but only abnormal results are displayed) Labs Reviewed  CBC - Abnormal; Notable for the following components:      Result Value   RBC 3.76 (*)    Hemoglobin 11.3 (*)    HCT 33.7 (*)    All other components within normal limits  BASIC METABOLIC PANEL - Abnormal; Notable for the following components:   Chloride 100 (*)    Glucose, Bld 232 (*)    BUN 31 (*)    Creatinine, Ser 1.48 (*)    GFR calc non Af Amer 45 (*)    GFR calc Af Amer 52 (*)     All other components within normal limits  PROTIME-INR  TROPONIN I  TROPONIN I  TROPONIN I  OCCULT BLOOD X 1 CARD TO LAB, STOOL  TYPE AND SCREEN    EKG  EKG Interpretation  Date/Time:  Thursday January 11 2017 10:39:52 EST Ventricular Rate:  91 PR Interval:    QRS Duration: 144 QT Interval:  388 QTC Calculation: 478 R Axis:   56 Text Interpretation:  Sinus rhythm Right bundle branch block Non-specific intra-ventricular conduction delay ST-t wave abnormality Abnormal ekg Confirmed by Carmin Muskrat [phone]) on 01/11/2017 10:48:09 AM       Radiology Dg Chest Port 1 View  Result Date: 01/11/2017 CLINICAL DATA:  Shortness of breath and chest pain. EXAM: PORTABLE CHEST 1 VIEW COMPARISON:  PA and lateral chest 12/23/2016 and 06/03/2015. FINDINGS: The lungs are clear. Heart size is normal. Aortic atherosclerosis is noted. No pneumothorax or pleural effusion. Reverse left shoulder arthroplasty is in place. Degenerative change right glenohumeral joint noted. IMPRESSION: No acute disease. Atherosclerosis. Electronically Signed   By: Inge Rise M.D.   On: 01/11/2017 12:26    Procedures Procedures (including critical care time)   Initial Impression / Assessment and Plan / ED Course  I have reviewed the triage vital signs and the nursing notes.  Pertinent labs & imaging results that were available during my care of the patient were reviewed by me and considered in my medical decision making (see chart for details).   Elderly male with history of CAD presents with new exertional dyspnea and pain. At rest, patient is hemodynamically stable, awake, alert, in no distress per With concern for unstable angina I discussed his case with our cardiology colleagues. Patient required admission for further evaluation and management. Initial studies in the ED was notable for demonstrate some new intraventricular conduction delay.  Patient also found to have slight hemoglobin drop, though he was  not hypotensive.  Patient seen and evaluated  by our cardiology colleagues, GI consult pending, and given his need for ongoing dual ant-platelet therapy he was admitted for further E/M.  Final Clinical Impressions(s) / ED Diagnoses   Final diagnoses:  Atypical chest pain     Carmin Muskrat, MD 01/11/17 1337    Carmin Muskrat, MD 01/11/17 1425

## 2017-01-11 NOTE — [place] Notes (Signed)
Patient was admitted and oriented to the unit. Vitals are stable and patient has no other complaints. Wife is at the bedside.   Drue Flirt, RN

## 2017-01-11 NOTE — Care Management Note (Signed)
Case Management Note  Patient Details  Name: Matthew [name] MRN: [id number] Date of Birth: 03/12/1940  Subjective/Objective:                  Chest Pain  Action/Plan: CM spoke with the patient at the bedside 5C08 (ED). Patient reports he lives with his wife. He has no home health services and does not feel he needs any services. Patient has a walker, wheelchair, crutches, cane and a shower chair at home from a previous surgery. Reports no issues obtaining medications or getting to physician appointments.   Expected Discharge Date:   unknown               Expected Discharge Plan:  Home/Self Care  In-House Referral:     Discharge planning Services  CM Consult  Post Acute Care Choice:    Choice offered to:     DME Arranged:    DME Agency:     HH Arranged:    HH Agency:     Status of Service:  In process, will continue to follow  If discussed at Long Length of Stay Meetings, dates discussed:    Additional Comments:  Apolonio Schneiders, RN 01/11/2017, 7:40 PM

## 2017-01-11 NOTE — H&P (Signed)
Cardiology Office Note    Date:  01/11/2017  ID:  Matthew [name], DOB [birth year], MRN [id number] PCP:  Wenda Low, MD  Cardiologist:  Dr. Angelena Form   Chief Complaint: f/u MI  History of Present Illness:  Matthew [name] is a 76 y.o. male with history of type 2 diabetes, hypertension, CKD II, kidney stones, basal cell of the nose, CAD/NSTEMI s/p stenting of LAD/RCA and PTCA of PL branch 12/2016, RBBB, hyperlipidemia who presents for post-hospital follow-up. He was recently admitted with chest pressure as well as radiation into his right arm and ruled in for MI. He was treated with NTG infusion but developed hypotension with this. Troponin peak was 1.76. His first cath was on 12/25/16 resulting in PTCA/DES x3 to prox and mid LAD with severe stenosis in a diagonal branch, too small for PCI, and severe stenosis of prox RCA and PL branch with plans for staged PCI. He did have chest pain post-procedurally treated with analgesics and NTG. Repeat cath 12/26/16 showed patent LAD stents, 50% narrowing in the LAD beyond first stent, and ultimately had PTCA of the PLA and DES to the mRCA. It was recommended he continue with DAPT for a minimum of 1 year. 2D echo 12/26/16: EF 78-29, normal diastolic function, PASP 36. Other pertinent labs include Hgb 15.6 on admission->12.4 (previously 12-14), Cr 1.05, K 3.3->4.0, LDL 113, no recent LFTs.  He returns for follow-up today with his daughter reporting recurrent angina. He actually called the office 01/03/17 at which time he reported thick black stools. He was advised to go to the ED. He reports his PCP started him on Protonix 40mg  BID. He reports he saw [facility] and was set up for endoscopy but he was told by anesthesiology that they would not touch him for 6 months since he was on [place]. Instead of going to ED he preferred to f/u in the office today. He reports that he had felt generally well for 3-4 days after discharge but then began having recurrent chest  pressure, bilateral arm numbness and dyspnea any time he exerts himself. He also has had generalized exhaustion. He states his stools are now less dark than they were before, but still intermittently dark. He does not have any discomfort at rest.   Past Medical History:  Diagnosis Date  . Arthritis    Shoulder, knees, back  . Basal cell carcinoma    bridge of nose- basal cell  . CAD in native artery    a. CAD/NSTEMI s/p stenting of LAD then staged stenting to RCA and PTCA of PL branch 12/2016, EF 60-65%.  . Cataract    right  . CKD (chronic kidney disease), stage II    Stage II, per Dr. 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 Complication of anesthesia    "Too much with shoulder surgery", pt. reports that he was told the at they "lost him, due to absorbing too much anesthesia". Not enough for  Right ,  Left knee  just right.  . Constipation    occ  . Diabetes mellitus    type 2 diet controlled  . Dysrhythmia    has been irregular at times since age 12.  Marland Kitchen History of kidney stones   . Hyperlipidemia   . Hypertension   . RBBB     Past Surgical History:  Procedure Laterality Date  . COLONOSCOPY WITH PROPOFOL N/A 06/05/2016   Procedure: COLONOSCOPY WITH PROPOFOL;  Surgeon: Garlan Fair, MD;  Location: [facility];  Service: Endoscopy;  Laterality: N/A;  . CORONARY ANGIOGRAPHY N/A 12/26/2016   Procedure: CORONARY ANGIOGRAPHY;  Surgeon: Troy Sine, MD;  Location: [place] [facility];  Service: Cardiovascular;  Laterality: N/A;  . CORONARY STENT INTERVENTION N/A 12/25/2016   Procedure: CORONARY STENT INTERVENTION;  Surgeon: Burnell Blanks, MD;  Location: [place] [facility];  Service: Cardiovascular;  Laterality: N/A;  . CORONARY STENT INTERVENTION N/A 12/26/2016   Procedure: CORONARY STENT INTERVENTION;  Surgeon: Troy Sine, MD;  Location: [place] [facility];  Service: Cardiovascular;  Laterality: N/A;  . EYE SURGERY     Cataract Left eye  . fisture      x 2 anal fissure  .  HAND SURGERY     x2 for ligament rebuilt  . KNEE SURGERY  2009   Right- first arthroscopy, then partial replacement- [facility]   . LEFT HEART CATH AND CORONARY ANGIOGRAPHY N/A 12/25/2016   Procedure: LEFT HEART CATH AND CORONARY ANGIOGRAPHY;  Surgeon: Burnell Blanks, MD;  Location: [place] [facility];  Service: Cardiovascular;  Laterality: N/A;  . LITHOTRIPSY    . LUMBAR LAMINECTOMY/DECOMPRESSION MICRODISCECTOMY N/A 06/09/2015   Procedure: Laminectomy - T12-L1;  Surgeon: Eustace Moore, MD;  Location: [place] [facility];  Service: Neurosurgery;  Laterality: N/A;  Laminectomy - T12-L1  . MEDIAL PARTIAL KNEE REPLACEMENT  2010   [facility] prior [facility] also had arth[facility]ur[facility]   . POSTERIOR LUMBAR FUSION  02/02/2012  . REVERSE SHOULDER ARTHROPLASTY Left 12/06/2012   Dr Veverly Fells  . SHOULDER SURGERY  1985   L arthroscopic   . TOTAL SHOULDER ARTHROPLASTY Left 12/06/2012   Procedure: LEFT TOTAL SHOULDER ARTHROPLASTY VERSES A REVERSE TOTAL SHOULDER ARTHROPLASTY;  Surgeon: Augustin Schooling, MD;  Location: [place];  Service: Orthopedics;  Laterality: Left;    Current Medications: No outpatient medications have been marked as taking for the 01/11/17 encounter [facility] Encounter).     Allergies:   Oxycodone; Codeine; and Morphine and related   Social History   Socioeconomic History  . Marital status: Married    Spouse name: None  . Number of children: None  . Years of education: None  . Highest education level: None  Social Needs  . Financial resource strain: None  . Food insecurity - worry: None  . Food insecurity - inability: None  . Transportation needs - medical: None  . Transportation needs - non-medical: None  Occupational History  . None  Tobacco Use  . Smoking status: Never Smoker  . Smokeless tobacco: Never Used  Substance and Sexual Activity  . Alcohol use: No  . Drug use: No  . Sexual activity: Yes  Other Topics Concern  . None  Social History  Narrative  . None     Family History:  Family History  Problem Relation Age of Onset  . CAD Father   . CAD Sister      ROS:   Please see the history of present illness. No BRBPR. All other systems are reviewed and otherwise negative.    PHYSICAL EXAM:   VS:  BP (!) 142/79 (BP Location: Left Arm)   Pulse 93   Temp 98.4 F (36.9 C) (Oral)   Resp 16   SpO2 100%   BMI: There is no height or weight on file to calculate BMI. GEN: Well nourished, well developed WM, pale, in no acute distress  HEENT: normocephalic, atraumatic Neck: no JVD, carotid bruits, or masses Cardiac: RRR; no murmurs, rubs, or gallops, no edema  Respiratory:  clear to auscultation bilaterally, normal work of breathing GI: soft, nontender, nondistended, + BS MS: no deformity or atrophy  Skin: warm and dry, no rash Neuro:  Alert and Oriented x 3, Strength and sensation are intact, follows commands Psych: euthymic mood, full affect  Wt Readings from Last 3 Encounters:  01/11/17 180 lb 1.9 oz (81.7 kg)  12/27/16 182 lb 15.7 oz (83 kg)  06/05/16 185 lb (83.9 kg)      Studies/Labs Reviewed:   EKG:  EKG was ordered today and personally reviewed by me and demonstrates NSR 86bpm, RBBB, nonspecific ST-T changes a/w RBBB  Recent Labs: 01/11/2017: BUN 31; Creatinine, Ser 1.48; Hemoglobin 11.3; Platelets 335; Potassium 3.5; Sodium 136   Lipid Panel    Component Value Date/Time   CHOL 163 12/24/2016 0634   TRIG 63 12/24/2016 0634   HDL 37 (L) 12/24/2016 0634   CHOLHDL 4.4 12/24/2016 0634   VLDL 13 12/24/2016 0634   LDLCALC 113 (H) 12/24/2016 5366    Additional studies/ records that were reviewed today include: Summarized above.    ASSESSMENT & PLAN:   1. CAD/NSTEMI with recurrent unstable angina - I am concerned about his recurrent anginal symptoms which began shortly after discharge, particularly in the setting of melena for over 1 week. He had endoscopy planned but this was cancelled due to being on  ASA/Brilinta. Instead of going to ED as recommended, he preferred to wait until f/u in the office today. He continues to feel poorly with any exertion. There is concern for symptomatic anemia contributing. I d/w Dr. Angelena Form. Will plan to admit and check labs including Hgb and troponins. May need re-look study if anemia is not significant. Risks and benefits of cardiac catheterization have been discussed with the patient. These include bleeding, infection, kidney damage, stroke, heart attack, death. The patient understands these risks and is willing to proceed, if that is what is decided. Hold off heparin given #2. Dr. Angelena Form also saw and examined the patient with me. 2. Melena - see above. Check stat CBC upon arrival to hospital. Continue PPI. May need to reconsult GI contingent on results- saw [facility] recently. Discussed life-threatening nature of evidence of GIB and asked him not to wait in the future; to seek care in ED when asked to do so. 3. HTN - controlled, follow BP with hospital course. Not clear why he didn't go home on metoprolol but will follow - HR in the hospital previously was in the 50s. It's now 80s-90s which could be explained by anemia if present, so will follow before re-adding beta blocker. 4. Hyperlidemia - continue statin. 5. Diabetes mellitus - will add SSI for now, but plan to resume standing insulin once it is known that he will not require any more procedures.  Disposition: F/u TBD based on hospital plan.   Medication Adjustments/Labs and Tests Ordered: Current medicines are reviewed at length with the patient today.  Concerns regarding medicines are outlined above. Medication changes, Labs and Tests ordered today are summarized above and listed in the Patient Instructions accessible in Encounters.   Signed, Charlie Pitter, PA-C  01/11/2017 12:33 PM    [facility] [address], [place], [place]  [address] Phone: [phone]; Fax: [phone]   I  have personally seen and examined this patient with Melina Copa, PA. I agree with the assessment and plan as outlined above. He has had recent coronary stent placements and is now on ASA and Brilinta.  He is having melena, chest pain and dyspnea. Concern for anemia with probable GI bleeding. Will admit and arrange CBC. He will need a GI consult. Likely cardiac cath before discharge.   Lauree Chandler 01/11/2017 12:52 PM

## 2017-01-11 NOTE — ED Triage Notes (Signed)
Sent to ED for eval of sob and cp. Pt recently in hospital - multiple stents placed. Pt with cp with any exertion now and dark stools. No active cp now. Pt appears pale. States he was told he'd be admitted, have cath done, then see GI

## 2017-01-11 NOTE — Patient Instructions (Addendum)
You are being sent to [facility] Emergency Room.  We hope you feel better soon!

## 2017-01-11 NOTE — ED Notes (Signed)
Pt c/o "a little aching in left side of chest-- but gets worse with any movement -- I get short of breath with walking"

## 2017-01-12 ENCOUNTER — Other Ambulatory Visit: Payer: Self-pay

## 2017-01-12 ENCOUNTER — Telehealth: Payer: Self-pay

## 2017-01-12 ENCOUNTER — Encounter (HOSPITAL_COMMUNITY)
Admission: EM | Disposition: A | Discharge status: Home / Self Care | Payer: Self-pay | Source: Home / Self Care | Attending: Emergency Medicine

## 2017-01-12 ENCOUNTER — Encounter (HOSPITAL_COMMUNITY): Payer: Self-pay | Admitting: *Deleted

## 2017-01-12 ENCOUNTER — Observation Stay (HOSPITAL_COMMUNITY): Payer: Medicare Other | Admitting: Anesthesiology

## 2017-01-12 DIAGNOSIS — D62 Acute posthemorrhagic anemia: Secondary | ICD-10-CM

## 2017-01-12 DIAGNOSIS — K922 Gastrointestinal hemorrhage, unspecified: Secondary | ICD-10-CM | POA: Diagnosis not present

## 2017-01-12 DIAGNOSIS — N179 Acute kidney failure, unspecified: Secondary | ICD-10-CM | POA: Diagnosis not present

## 2017-01-12 DIAGNOSIS — I200 Unstable angina: Secondary | ICD-10-CM | POA: Diagnosis not present

## 2017-01-12 DIAGNOSIS — K259 Gastric ulcer, unspecified as acute or chronic, without hemorrhage or perforation: Secondary | ICD-10-CM | POA: Diagnosis not present

## 2017-01-12 DIAGNOSIS — D631 Anemia in chronic kidney disease: Secondary | ICD-10-CM | POA: Diagnosis not present

## 2017-01-12 DIAGNOSIS — K921 Melena: Secondary | ICD-10-CM | POA: Diagnosis present

## 2017-01-12 HISTORY — PX: ESOPHAGOGASTRODUODENOSCOPY (EGD) WITH PROPOFOL: SHX5813

## 2017-01-12 LAB — GLUCOSE, CAPILLARY
Glucose-Capillary: 122 mg/dL — ABNORMAL HIGH (ref 65–99)
Glucose-Capillary: 111 mg/dL — ABNORMAL HIGH (ref 65–99)
Glucose-Capillary: 118 mg/dL — ABNORMAL HIGH (ref 65–99)
Glucose-Capillary: 120 mg/dL — ABNORMAL HIGH (ref 65–99)

## 2017-01-12 LAB — HEPATIC FUNCTION PANEL
ALT: 18 U/L (ref 17–63)
AST: 21 U/L (ref 15–41)
Albumin: 3.5 g/dL (ref 3.5–5.0)
Alkaline Phosphatase: 82 U/L (ref 38–126)
Bilirubin, Direct: 0.2 mg/dL (ref 0.1–0.5)
Indirect Bilirubin: 0.6 mg/dL (ref 0.3–0.9)
Total Bilirubin: 0.8 mg/dL (ref 0.3–1.2)
Total Protein: 6.3 g/dL — ABNORMAL LOW (ref 6.5–8.1)

## 2017-01-12 LAB — CBC
HCT: 32.3 % — ABNORMAL LOW (ref 39.0–52.0)
Hemoglobin: 10.8 g/dL — ABNORMAL LOW (ref 13.0–17.0)
MCH: 30.3 pg (ref 26.0–34.0)
MCHC: 33.4 g/dL (ref 30.0–36.0)
MCV: 90.5 fL (ref 78.0–100.0)
Platelets: 328 10*3/uL (ref 150–400)
RBC: 3.57 MIL/uL — ABNORMAL LOW (ref 4.22–5.81)
RDW: 13.6 % (ref 11.5–15.5)
WBC: 7.1 10*3/uL (ref 4.0–10.5)

## 2017-01-12 LAB — BASIC METABOLIC PANEL
Anion gap: 9 (ref 5–15)
BUN: 25 mg/dL — ABNORMAL HIGH (ref 6–20)
CO2: 28 mmol/L (ref 22–32)
Calcium: 9.6 mg/dL (ref 8.9–10.3)
Chloride: 101 mmol/L (ref 101–111)
Creatinine, Ser: 1.35 mg/dL — ABNORMAL HIGH (ref 0.61–1.24)
GFR calc Af Amer: 58 mL/min — ABNORMAL LOW (ref >60)
GFR calc non Af Amer: 50 mL/min — ABNORMAL LOW (ref >60)
Glucose, Bld: 123 mg/dL — ABNORMAL HIGH (ref 65–99)
Potassium: 3.5 mmol/L (ref 3.5–5.1)
Sodium: 138 mmol/L (ref 135–145)

## 2017-01-12 LAB — OCCULT BLOOD X 1 CARD TO LAB, STOOL: Fecal Occult Bld: POSITIVE — AB

## 2017-01-12 SURGERY — ESOPHAGOGASTRODUODENOSCOPY (EGD) WITH PROPOFOL
Anesthesia: Monitor Anesthesia Care

## 2017-01-12 MED ORDER — PHENYLEPHRINE 40 MCG/ML (10ML) SYRINGE FOR IV PUSH (FOR BLOOD PRESSURE SUPPORT)
PREFILLED_SYRINGE | INTRAVENOUS | Status: DC | PRN
  Administered 2017-01-12: 80 ug via INTRAVENOUS
  Administered 2017-01-12: 40 ug via INTRAVENOUS
  Administered 2017-01-12: 120 ug via INTRAVENOUS

## 2017-01-12 MED ORDER — SODIUM CHLORIDE 0.9 % IV SOLN
8.0000 mg/h | INTRAVENOUS | Status: DC
  Administered 2017-01-12 – 2017-01-13 (×2): 8 mg/h via INTRAVENOUS
  Filled 2017-01-12 (×3): qty 80

## 2017-01-12 MED ORDER — PROPOFOL 500 MG/50ML IV EMUL
INTRAVENOUS | Status: DC | PRN
  Administered 2017-01-12: 100 ug/kg/min via INTRAVENOUS

## 2017-01-12 MED ORDER — SODIUM CHLORIDE 0.9 % IV SOLN
INTRAVENOUS | Status: DC
  Administered 2017-01-12: 12:00:00 via INTRAVENOUS

## 2017-01-12 MED ORDER — PROPOFOL 10 MG/ML IV BOLUS
INTRAVENOUS | Status: DC | PRN
  Administered 2017-01-12 (×2): 20 mg via INTRAVENOUS

## 2017-01-12 MED ORDER — ISOSORBIDE MONONITRATE ER 30 MG PO TB24
30.0000 mg | ORAL_TABLET | Freq: Every day | ORAL | Status: DC
  Administered 2017-01-12 – 2017-01-13 (×2): 30 mg via ORAL
  Filled 2017-01-12 (×2): qty 1

## 2017-01-12 MED ORDER — SODIUM CHLORIDE 0.9 % IV SOLN
80.0000 mg | Freq: Once | INTRAVENOUS | Status: AC
  Administered 2017-01-12: 80 mg via INTRAVENOUS
  Filled 2017-01-12: qty 80

## 2017-01-12 MED ORDER — PANTOPRAZOLE SODIUM 40 MG IV SOLR: 40.0000 mg | Freq: Two times a day (BID) | INTRAVENOUS | Status: DC

## 2017-01-12 SURGICAL SUPPLY — 14 items

## 2017-01-12 NOTE — Progress Notes (Signed)
Pt refusing to sign consent. Pt and pt family requesting to speak to MD. Paged MD and endo. Endo stated MD is in procedure for another hour. Endo RN Kathlee Nations, aware pt consent not signed

## 2017-01-12 NOTE — Progress Notes (Signed)
Progress Note  Patient Name: Matthew [name] Date of Encounter: 01/12/2017  Primary Cardiologist: Matthew Chandler, MD   Subjective    One episode of cath overnight, resolved with nitro x 1. No recurrent pain.   Inpatient Medications    Scheduled Meds: . amLODipine  5 mg Oral QHS  . aspirin EC  81 mg Oral Daily  . atorvastatin  80 mg Oral q1800  . cycloSPORINE  1 drop Both Eyes BID  . insulin aspart  0-9 Units Subcutaneous TID WC  . pantoprazole  40 mg Oral BID  . sodium chloride flush  3 mL Intravenous Q12H  . ticagrelor  90 mg Oral BID   Continuous Infusions: . sodium chloride     PRN Meds: sodium chloride, acetaminophen, nitroGLYCERIN, ondansetron (ZOFRAN) IV, sodium chloride flush   Vital Signs    Vitals:   01/11/17 2302 01/11/17 2308 01/12/17 0627 01/12/17 0920  BP: (!) 146/80 140/60 115/89 (!) 144/70  Pulse: 74 74 71 70  Resp: 16 16  18   Temp: 98.2 F (36.8 C) 98.2 F (36.8 C) 98 F (36.7 C) 97.6 F (36.4 C)  TempSrc: Oral Oral Oral Oral  SpO2: 100%   100%  Weight:  174 lb 6.4 oz (79.1 kg) 173 lb 8 oz (78.7 kg)   Height:  5\' 9"  (1.753 m)      Intake/Output Summary (Last 24 hours) at 01/12/2017 1110 Last data filed at 01/12/2017 0827 Gross per 24 hour  Intake 0 ml  Output -  Net 0 ml   Filed Weights   01/11/17 2308 01/12/17 0627  Weight: 174 lb 6.4 oz (79.1 kg) 173 lb 8 oz (78.7 kg)    Telemetry    NSR - Personally Reviewed  ECG    SR with RBBB - Personally Reviewed  Physical Exam   GEN: No acute distress.   Neck: No JVD Cardiac: RRR, no murmurs, rubs, or gallops.  Respiratory: Clear to auscultation bilaterally. GI: Soft, nontender, non-distended  MS: No edema; No deformity. Neuro:  Nonfocal  Psych: Normal affect   Labs    Chemistry Recent Labs  Lab 01/11/17 1047 01/12/17 0401  NA 136 138  K 3.5 3.5  CL 100* 101  CO2 25 28  GLUCOSE 232* 123*  BUN 31* 25*  CREATININE 1.48* 1.35*  CALCIUM 9.6 9.6  PROT  --  6.3*    ALBUMIN  --  3.5  AST  --  21  ALT  --  18  ALKPHOS  --  82  BILITOT  --  0.8  GFRNONAA 45* 50*  GFRAA 52* 58*  ANIONGAP 11 9     Hematology Recent Labs  Lab 01/11/17 1047 01/12/17 0401  WBC 7.9 7.1  RBC 3.76* 3.57*  HGB 11.3* 10.8*  HCT 33.7* 32.3*  MCV 89.6 90.5  MCH 30.1 30.3  MCHC 33.5 33.4  RDW 13.3 13.6  PLT 335 328    Cardiac Enzymes Recent Labs  Lab 01/11/17 1047 01/11/17 1755 01/11/17 2121  TROPONINI <0.03 <0.03 <0.03   No results for input(s): TROPIPOC in the last 168 hours.   BNPNo results for input(s): BNP, PROBNP in the last 168 hours.   DDimer No results for input(s): DDIMER in the last 168 hours.   Radiology    Dg Chest Port 1 View  Result Date: 01/11/2017 CLINICAL DATA:  Shortness of breath and chest pain. EXAM: PORTABLE CHEST 1 VIEW COMPARISON:  PA and lateral chest 12/23/2016 and 06/03/2015. FINDINGS: The lungs are clear.  Heart size is normal. Aortic atherosclerosis is noted. No pneumothorax or pleural effusion. Reverse left shoulder arthroplasty is in place. Degenerative change right glenohumeral joint noted. IMPRESSION: No acute disease. Atherosclerosis. Electronically Signed   By: Inge Rise M.D.   On: 01/11/2017 12:26    Cardiac Studies   None this admission.   Patient Profile     Matthew [name] is a 76 y.o. male with history of type 2 diabetes, hypertension, CKD II, kidney stones, basal cell of the nose, CAD/NSTEMI s/p stenting of LAD/RCA and PTCA of PL branch 12/2016, RBBB, hyperlipidemia who presented for post-hospital follow-up 01/11/17. Transferred to ER for recurrent anginal symptoms which began shortly after discharge, particularly in the setting of melena for over 1 week. He had endoscopy planned but this was cancelled due to being on ASA/Brilinta.  Assessment & Plan    1. CAD with recent NSTEMI - Chest pain last night resolved after SL nitro x 1. No recurrent pain.  - Continue DAPT with ASA and Brillinta. Add imdur 30mg   qd.   2. Melena - Prior baseline Hgb at time of last admission was around 15. When he was discharged it was 12.4. Hgb of 10.8 today.  - Pending GI evaluation. He is NPO.   3. HLD - Continue statin  4. HTN - minimally elevated. Continue Norvasc. Add Imdur as above  For questions or updates, please contact [place] Please consult [email or web address] for contact info under Cardiology/STEMI.      SignedLeanor Kail, PA  01/12/2017, 11:10 AM    Patient seen and examined and history reviewed. Agree with above findings and plan. Patient is doing well this am. Concerned about 4 gram drop in Hgb with melanotic stools. Suspect UGI bleed. On DAPT due to recent stents. He has been having more chest pain but it is unclear if this is angina or more related to GI source. Will need upper EGD. I spoke with Dr. Michail Sermon who will see.   Matthew [name], [place] 01/12/2017 11:32 AM

## 2017-01-12 NOTE — Progress Notes (Signed)
Called cath lab  Cath lab stated pt is not scheduled or on add on list  Awaiting on MD to round

## 2017-01-12 NOTE — Progress Notes (Signed)
Pt returned from endo with sharp chest pain on left side  MD aware  Vital signs documented  EKG completed  No new orders at this time

## 2017-01-12 NOTE — Progress Notes (Signed)
Informed gastroenterologist MD of positive fecal occult stool

## 2017-01-12 NOTE — Anesthesia Postprocedure Evaluation (Signed)
Anesthesia Post Note  Patient: Matthew [name]  Procedure(s) Performed: ESOPHAGOGASTRODUODENOSCOPY (EGD) WITH PROPOFOL (N/A )     Patient location during evaluation: PACU Anesthesia Type: MAC Level of consciousness: awake and alert Pain management: pain level controlled Vital Signs Assessment: post-procedure vital signs reviewed and stable Respiratory status: spontaneous breathing, nonlabored ventilation, respiratory function stable and patient connected to nasal cannula oxygen Cardiovascular status: stable and blood pressure returned to baseline Postop Assessment: no apparent nausea or vomiting Anesthetic complications: no    Last Vitals:  Vitals:   01/12/17 1532 01/12/17 1547  BP: 124/69 116/74  Pulse: 64 71  Resp:    Temp:    SpO2:      Last Pain:  Vitals:   01/12/17 1559  TempSrc:   PainSc: 0-No pain                 Ryan P Ellender

## 2017-01-12 NOTE — H&P (View-Only) (Signed)
Referring Provider: Dr. Martinique Primary Care Physician:  Wenda Low, MD Primary Gastroenterologist:  Dr. Therisa Doyne  Reason for Consultation:  Melena  HPI: Matthew [name] is a 76 y.o. male who is s/p NSTEMI with coronary stent placement in November and started on Brilinta and since starting the Brilinta has been having daily black stools 1-2 times per day without any associated abdominal pain, N/V/heartburn/dysphagia/dizziness. Denies hematochezia. No history of peptic ulcer disease. Outpt EGD cancelled due to medical comorbidities. Hgb 10.8 (12.4 on 12/27/16; day of discharge from heart cath). Small tubular adenoma removed from the sigmoid colon on a colonoscopy in April 2018 by Dr. Wynetta Emery. Family by the bedside.  Past Medical History:  Diagnosis Date  . Arthritis    Shoulder, knees, back (01/11/2017)  . CAD in native artery    a. CAD/NSTEMI s/p stenting of LAD then staged stenting to RCA and PTCA of PL branch 12/2016, EF 60-65%.  . Chronic lower back pain   . CKD (chronic kidney disease), stage II    Stage II, per Dr. 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 Complication of anesthesia    "Too much with shoulder surgery", pt. reports that he was told the at they "lost him, due to absorbing too much anesthesia". Not enough for  Right ,  Left knee  just right.  . Constipation    occ  . Dysrhythmia    has been irregular at times since age 89.  Marland Kitchen History of kidney stones   . Hyperlipidemia   . Hypertension   . 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 Myocardial infarction ([place]) 12/2016 X 2  . RBBB   . Type II diabetes mellitus ([place])     Past Surgical History:  Procedure Laterality Date  . ANAL FISSURE REPAIR  X 2  . BACK SURGERY    . CATARACT EXTRACTION W/ INTRAOCULAR LENS  IMPLANT, BILATERAL Bilateral   . COLONOSCOPY WITH PROPOFOL N/A 06/05/2016   Procedure: COLONOSCOPY WITH PROPOFOL;  Surgeon: Garlan Fair, MD;  Location: [facility];  Service: Endoscopy;   Laterality: N/A;  . CORONARY ANGIOGRAPHY N/A 12/26/2016   Procedure: CORONARY ANGIOGRAPHY;  Surgeon: Troy Sine, MD;  Location: [place] [facility];  Service: Cardiovascular;  Laterality: N/A;  . CORONARY ANGIOPLASTY WITH STENT PLACEMENT     "I've got a total of 4 stents" (01/11/2017)  . CORONARY STENT INTERVENTION N/A 12/25/2016   Procedure: CORONARY STENT INTERVENTION;  Surgeon: Burnell Blanks, MD;  Location: [place] [facility];  Service: Cardiovascular;  Laterality: N/A;  . CORONARY STENT INTERVENTION N/A 12/26/2016   Procedure: CORONARY STENT INTERVENTION;  Surgeon: Troy Sine, MD;  Location: [place] [facility];  Service: Cardiovascular;  Laterality: N/A;  . HAND LIGAMENT RECONSTRUCTION Right X 2  . KNEE ARTHROSCOPY Bilateral 2009-2010   "both at [facility]  . LEFT HEART CATH AND CORONARY ANGIOGRAPHY N/A 12/25/2016   Procedure: LEFT HEART CATH AND CORONARY ANGIOGRAPHY;  Surgeon: Burnell Blanks, MD;  Location: [place] [facility];  Service: Cardiovascular;  Laterality: N/A;  . LITHOTRIPSY    . LUMBAR LAMINECTOMY/DECOMPRESSION MICRODISCECTOMY N/A 06/09/2015   Procedure: Laminectomy - T12-L1;  Surgeon: Eustace Moore, MD;  Location: [place] [facility];  Service: Neurosurgery;  Laterality: N/A;  Laminectomy - T12-L1  . MEDIAL PARTIAL KNEE REPLACEMENT Bilateral 2009-2010    [place]   . MELANOMA EXCISION Right    "side of my nose"  . POSTERIOR LUMBAR FUSION  02/02/2012  . SHOULDER ARTHROSCOPY Left 1985  . TOTAL SHOULDER ARTHROPLASTY Left 12/06/2012   Procedure: LEFT TOTAL SHOULDER ARTHROPLASTY VERSES A REVERSE TOTAL SHOULDER ARTHROPLASTY;  Surgeon: Augustin Schooling, MD;  Location: [place];  Service: Orthopedics;  Laterality: Left;    Prior to Admission medications   Medication Sig Start Date End Date Taking? Authorizing Provider  acetaminophen (TYLENOL) 325 MG tablet Take 2 tablets (650 mg total) by mouth every 4 (four) hours as needed for headache or mild pain. 12/27/16  Yes  Rama, Venetia Maxon, MD  amLODipine (NORVASC) 5 MG tablet Take 5 mg by mouth daily.   Yes [provider]  aspirin 81 MG chewable tablet Chew 1 tablet (81 mg total) by mouth daily. 12/28/16  Yes Rama, Venetia Maxon, MD  atorvastatin (LIPITOR) 80 MG tablet Take 1 tablet (80 mg total) by mouth daily at 6 PM. 12/27/16  Yes Rama, Venetia Maxon, MD  HUMALOG MIX 75/25 KWIKPEN (75-25) 100 UNIT/ML Kwikpen 20 UNITS TWO TIMES A DAY WITH A MEAL SUBCUTANEOUS 12/27/16  Yes [provider]  losartan-hydrochlorothiazide (HYZAAR) 100-12.5 MG tablet Take 1 tablet by mouth daily before breakfast. 12/27/16  Yes Rama, Venetia Maxon, MD  nitroGLYCERIN (NITROSTAT) 0.4 MG SL tablet Place 1 tablet (0.4 mg total) under the tongue every 5 (five) minutes as needed for chest pain. 12/27/16  Yes Rama, Venetia Maxon, MD  Omega-3 Fatty Acids (FISH OIL) 1000 MG CAPS Take 1,000 mg by mouth daily.   Yes [provider]  pantoprazole (PROTONIX) 40 MG tablet Take 40 mg by mouth 2 (two) times daily. 01/01/17  Yes [provider]  RESTASIS 0.05 % ophthalmic emulsion Place 1 drop 2 (two) times daily into both eyes. 10/25/16  Yes [provider]  ticagrelor (BRILINTA) 90 MG TABS tablet Take 1 tablet (90 mg total) by mouth 2 (two) times daily. 12/27/16  Yes Reino Bellis B, NP    Scheduled Meds: . amLODipine  5 mg Oral QHS  . aspirin EC  81 mg Oral Daily  . atorvastatin  80 mg Oral q1800  . cycloSPORINE  1 drop Both Eyes BID  . insulin aspart  0-9 Units Subcutaneous TID WC  . isosorbide mononitrate  30 mg Oral Daily  . [START ON 01/15/2017] pantoprazole  40 mg Intravenous Q12H  . sodium chloride flush  3 mL Intravenous Q12H  . ticagrelor  90 mg Oral BID   Continuous Infusions: . sodium chloride    . sodium chloride 20 mL/hr at 01/12/17 1135  . pantoprozole (PROTONIX) infusion 8 mg/hr (01/12/17 1308)   PRN Meds:.sodium chloride, acetaminophen, nitroGLYCERIN, ondansetron (ZOFRAN) IV, sodium  chloride flush  Allergies as of 01/11/2017 - Review Complete 01/11/2017  Allergen Reaction Noted  . Oxycodone Itching 06/01/2015  . Codeine Itching 09/23/2010  . Morphine and related Itching 09/23/2010    Family History  Problem Relation Age of Onset  . CAD Father   . CAD Sister     Social History   Socioeconomic History  . Marital status: Married    Spouse name: Not on file  . Number of children: Not on file  . Years of education: Not on file  . Highest education level: Not on file  Social Needs  . Financial resource strain: Not on file  . Food insecurity - worry: Not on file  . Food insecurity - inability: Not on file  . Transportation needs - medical: Not on file  . Transportation needs - non-medical: Not on file  Occupational  History  . Not on file  Tobacco Use  . Smoking status: Never Smoker  . Smokeless tobacco: Never Used  Substance and Sexual Activity  . Alcohol use: No  . Drug use: No  . Sexual activity: Yes  Other Topics Concern  . Not on file  Social History Narrative  . Not on file    Review of Systems: All negative except as stated above in HPI.  Physical Exam: Vital signs: Vitals:   01/12/17 0920 01/12/17 1126  BP: (!) 144/70 122/69  Pulse: 70 66  Resp: 18 18  Temp: 97.6 F (36.4 C) 98.1 F (36.7 C)  SpO2: 100% 99%   Last BM Date: 01/12/11 General:   Alert,  Well-developed, well-nourished, pleasant and cooperative in NAD Head: normocephalic, atraumatic Eyes: anicteric sclera ENT: oropharynx clear Neck: supple, nontender Lungs:  Clear throughout to auscultation.   No wheezes, crackles, or rhonchi. No acute distress. Heart:  Regular rate and rhythm; no murmurs, clicks, rubs,  or gallops. Abdomen: soft, nontender, nondistended, +BS  Rectal:  Deferred Ext: no edema  GI:  Lab Results: Recent Labs    01/11/17 1047 01/12/17 0401  WBC 7.9 7.1  HGB 11.3* 10.8*  HCT 33.7* 32.3*  PLT 335 328   BMET Recent Labs    01/11/17 1047  01/12/17 0401  NA 136 138  K 3.5 3.5  CL 100* 101  CO2 25 28  GLUCOSE 232* 123*  BUN 31* 25*  CREATININE 1.48* 1.35*  CALCIUM 9.6 9.6   LFT Recent Labs    01/12/17 0401  PROT 6.3*  ALBUMIN 3.5  AST 21  ALT 18  ALKPHOS 82  BILITOT 0.8  BILIDIR 0.2  IBILI 0.6   PT/INR Recent Labs    01/11/17 1047  LABPROT 13.1  INR 1.00     Studies/Results: Dg Chest Port 1 View  Result Date: 01/11/2017 CLINICAL DATA:  Shortness of breath and chest pain. EXAM: PORTABLE CHEST 1 VIEW COMPARISON:  PA and lateral chest 12/23/2016 and 06/03/2015. FINDINGS: The lungs are clear. Heart size is normal. Aortic atherosclerosis is noted. No pneumothorax or pleural effusion. Reverse left shoulder arthroplasty is in place. Degenerative change right glenohumeral joint noted. IMPRESSION: No acute disease. Atherosclerosis. Electronically Signed   By: Inge Rise M.D.   On: 01/11/2017 12:26    Impression/Plan: 76 yo with melenic stools in the setting of Brilinta from a recent coronary stent. GI bleed concerning for peptic ulcer disease. EGD today to further evaluate. Protonix drip. NPO for EGD. Follow H/Hs. Risks/benefits of EGD discussed with patient and family and they agree to proceed.    LOS: 0 days   Mendon C.  01/12/2017, 1:12 PM  Pager [phone]  AFTER 5 pm or on weekends please call [phone]

## 2017-01-12 NOTE — Progress Notes (Signed)
Report called to endo RN. Aware pt had medications, sips with medications and otherwise NPO.

## 2017-01-12 NOTE — Op Note (Signed)
[facility] Patient Name: Matthew [name] Procedure Date : 01/12/2017 MRN: [id number] Attending MD: Lear Ng , MD Date of Birth: [birth year] CSN: [id number] Age: 76 Admit Type: Inpatient Procedure:                Upper GI endoscopy Indications:              Suspected upper gastrointestinal bleeding, Melena Providers:                Lear Ng, MD, Cleda Daub, RN, Cherylynn Ridges, Technician Referring MD:             Peter M. Martinique, MD Medicines:                Propofol per Anesthesia, Monitored Anesthesia Care Complications:            No immediate complications. Estimated Blood Loss:     Estimated blood loss: none. Procedure:                Pre-Anesthesia Assessment:                           - Prior to the procedure, a History and Physical                            was performed, and patient medications and                            allergies were reviewed. The patient's tolerance of                            previous anesthesia was also reviewed. The risks                            and benefits of the procedure and the sedation                            options and risks were discussed with the patient.                            All questions were answered, and informed consent                            was obtained. Prior Anticoagulants: The patient has                            taken Brilinta, last dose was day of procedure. ASA                            Grade Assessment: IV - A patient with severe                            systemic disease that is a constant threat to life.  After reviewing the risks and benefits, the patient                            was deemed in satisfactory condition to undergo the                            procedure.                           After obtaining informed consent, the endoscope was                            passed under direct vision. Throughout  the                            procedure, the patient's blood pressure, pulse, and                            oxygen saturations were monitored continuously. The                            EG-[id number] ([id number]) scope was introduced through the                            mouth, and advanced to the second part of duodenum.                            The upper GI endoscopy was accomplished without                            difficulty. The patient tolerated the procedure                            well. Scope In: Scope Out: Findings:      The examined esophagus was normal.      The Z-line was regular and was found 38 cm from the incisors.      One non-bleeding cratered gastric ulcer with no stigmata of bleeding was       found in the gastric antrum. The lesion was 4 mm in largest dimension.      Segmental mild inflammation characterized by congestion (edema) and       erythema was found in the gastric antrum.      The cardia and gastric fundus were normal on retroflexion.      The examined duodenum was normal.      There is no endoscopic evidence of bleeding in the entire examined       stomach.      There is no endoscopic evidence of bleeding in the entire examined       duodenum. Impression:               - Normal esophagus.                           - Z-line regular, 38 cm from the incisors.                           -  Non-bleeding gastric ulcer with no stigmata of                            bleeding.                           - Acute gastritis.                           - Normal examined duodenum.                           - No specimens collected. Moderate Sedation:      N/A - MAC procedure Recommendation:           - NPO except ice chips.                           - Observe patient's clinical course.                           - Perform an H. pylori serology.                           - No ibuprofen, naproxen, or other non-steroidal                            anti-inflammatory  drugs. Procedure Code(s):        --- Professional ---                           [phone], Esophagogastroduodenoscopy, flexible,                            transoral; diagnostic, including collection of                            specimen(s) by brushing or washing, when performed                            (separate procedure) Diagnosis Code(s):        --- Professional ---                           K92.1, Melena (includes Hematochezia)                           K25.9, Gastric ulcer, unspecified as acute or                            chronic, without hemorrhage or perforation                           K29.00, Acute gastritis without bleeding CPT copyright 2016 American Medical Association. All rights reserved. The codes documented in this report are preliminary and upon coder review may  be revised to meet current compliance requirements. Lear Ng, MD 01/12/2017 2:28:56 PM This report has been signed electronically. Number of Addenda: 0

## 2017-01-12 NOTE — Telephone Encounter (Signed)
   [facility] Pre-operative Risk Assessment    Request for surgical clearance:  1. What type of surgery is being performed? endoscopy   2. When is this surgery scheduled? TBD   3. Are there any medications that need to be held prior to surgery and how long? N/A   4. Practice name and name of physician performing surgery? [facility]    5. What is your office phone and fax number? (P) [phone] (F) [phone]   6. Anesthesia type (None, local, MAC, general) ? Please clear patient for sedation for procedure   Bobby Rumpf 01/12/2017, 11:08 AM  _________________________________________________________________   (provider comments below)  \

## 2017-01-12 NOTE — Interval H&P Note (Signed)
History and Physical Interval Note:  01/12/2017 2:00 PM  Matthew [name]  has presented today for surgery, with the diagnosis of GI bleed  The various methods of treatment have been discussed with the patient and family. After consideration of risks, benefits and other options for treatment, the patient has consented to  Procedure(s): ESOPHAGOGASTRODUODENOSCOPY (EGD) WITH PROPOFOL (N/A) as a surgical intervention .  The patient's history has been reviewed, patient examined, no change in status, stable for surgery.  I have reviewed the patient's chart and labs.  Questions were answered to the patient's satisfaction.     [place] C.

## 2017-01-12 NOTE — Transfer of Care (Signed)
Immediate Anesthesia Transfer of Care Note  Patient: Matthew [name]  Procedure(s) Performed: ESOPHAGOGASTRODUODENOSCOPY (EGD) WITH PROPOFOL (N/A )  Patient Location: Endoscopy Unit  Anesthesia Type:MAC  Level of Consciousness: awake, oriented and patient cooperative  Airway & Oxygen Therapy: Patient Spontanous Breathing and Patient connected to nasal cannula oxygen  Post-op Assessment: Report given to RN, Post -op Vital signs reviewed and stable and Patient moving all extremities  Post vital signs: Reviewed and stable  Last Vitals:  Vitals:   01/12/17 1316 01/12/17 1429  BP: (!) 154/86 (!) 121/58  Pulse: 68 61  Resp: (!) 21 17  Temp: 36.6 C 36.6 C  SpO2: 100% 100%    Last Pain:  Vitals:   01/12/17 1429  TempSrc: Oral  PainSc: 2          Complications: No apparent anesthesia complications

## 2017-01-12 NOTE — Anesthesia Preprocedure Evaluation (Addendum)
Anesthesia Evaluation  Patient identified by MRN, date of birth, ID band Patient awake    Reviewed: Allergy & Precautions, H&P , NPO status , Patient's Chart, lab work & pertinent test results  Airway Mallampati: I  TM Distance: >3 FB Neck ROM: Full    Dental no notable dental hx. (+) Teeth Intact   Pulmonary neg pulmonary ROS,    Pulmonary exam normal        Cardiovascular hypertension, Pt. on medications + angina + CAD, + Past MI and + Cardiac Stents (x 4, 3 weeks ago)   Rhythm:Regular Rate:Normal  ECG: NSR, rate 20  Sees cardiologist   Neuro/Psych negative neurological ROS  negative psych ROS   GI/Hepatic Neg liver ROS,  GI bleed   Endo/Other  diabetes, Insulin Dependent  Renal/GU Renal InsufficiencyRenal disease     Musculoskeletal Chronic lower back pain   Abdominal Normal abdominal exam  (+)   Peds  Hematology  (+) anemia ,   Anesthesia Other Findings HLD  Reproductive/Obstetrics                            Anesthesia Physical  Anesthesia Plan  ASA: IV  Anesthesia Plan: MAC   Post-op Pain Management:    Induction: Intravenous  PONV Risk Score and Plan: 1 and Propofol infusion and Treatment may vary due to age or medical condition  Airway Management Planned: Natural Airway  Additional Equipment:   Intra-op Plan:   Post-operative Plan:   Informed Consent: I have reviewed the patients History and Physical, chart, labs and discussed the procedure including the risks, benefits and alternatives for the proposed anesthesia with the patient or authorized representative who has indicated his/her understanding and acceptance.   Dental advisory given  Plan Discussed with: CRNA  Anesthesia Plan Comments:        Anesthesia Quick Evaluation

## 2017-01-12 NOTE — Brief Op Note (Signed)
Clean based antral ulcer with gastritis. No active bleeding and no blood products seen. Continue Protonix drip. NPO except ice chips today and if stable change to clear liquid diet tomorrow. Check H. Pylori serology and treat if positive. Remains on Brilinta due to recent coronary stent. Dr. Watt Climes to f/u tomorrow.

## 2017-01-12 NOTE — Consult Note (Signed)
Referring Provider: Dr. Martinique Primary Care Physician:  Wenda Low, MD Primary Gastroenterologist:  Dr. Therisa Doyne  Reason for Consultation:  Melena  HPI: Matthew [name] is a 76 y.o. male who is s/p NSTEMI with coronary stent placement in November and started on Brilinta and since starting the Brilinta has been having daily black stools 1-2 times per day without any associated abdominal pain, N/V/heartburn/dysphagia/dizziness. Denies hematochezia. No history of peptic ulcer disease. Outpt EGD cancelled due to medical comorbidities. Hgb 10.8 (12.4 on 12/27/16; day of discharge from heart cath). Small tubular adenoma removed from the sigmoid colon on a colonoscopy in April 2018 by Dr. Wynetta Emery. Family by the bedside.  Past Medical History:  Diagnosis Date  . Arthritis    Shoulder, knees, back (01/11/2017)  . CAD in native artery    a. CAD/NSTEMI s/p stenting of LAD then staged stenting to RCA and PTCA of PL branch 12/2016, EF 60-65%.  . Chronic lower back pain   . CKD (chronic kidney disease), stage II    Stage II, per Dr. 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 Complication of anesthesia    "Too much with shoulder surgery", pt. reports that he was told the at they "lost him, due to absorbing too much anesthesia". Not enough for  Right ,  Left knee  just right.  . Constipation    occ  . Dysrhythmia    has been irregular at times since age 81.  Marland Kitchen History of kidney stones   . Hyperlipidemia   . Hypertension   . 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 Myocardial infarction ([place]) 12/2016 X 2  . RBBB   . Type II diabetes mellitus ([place])     Past Surgical History:  Procedure Laterality Date  . ANAL FISSURE REPAIR  X 2  . BACK SURGERY    . CATARACT EXTRACTION W/ INTRAOCULAR LENS  IMPLANT, BILATERAL Bilateral   . COLONOSCOPY WITH PROPOFOL N/A 06/05/2016   Procedure: COLONOSCOPY WITH PROPOFOL;  Surgeon: Garlan Fair, MD;  Location: [facility];  Service: Endoscopy;   Laterality: N/A;  . CORONARY ANGIOGRAPHY N/A 12/26/2016   Procedure: CORONARY ANGIOGRAPHY;  Surgeon: Troy Sine, MD;  Location: [facility];  Service: Cardiovascular;  Laterality: N/A;  . CORONARY ANGIOPLASTY WITH STENT PLACEMENT     "I've got a total of 4 stents" (01/11/2017)  . CORONARY STENT INTERVENTION N/A 12/25/2016   Procedure: CORONARY STENT INTERVENTION;  Surgeon: Burnell Blanks, MD;  Location: [facility];  Service: Cardiovascular;  Laterality: N/A;  . CORONARY STENT INTERVENTION N/A 12/26/2016   Procedure: CORONARY STENT INTERVENTION;  Surgeon: Troy Sine, MD;  Location: [facility];  Service: Cardiovascular;  Laterality: N/A;  . HAND LIGAMENT RECONSTRUCTION Right X 2  . KNEE ARTHROSCOPY Bilateral 2009-2010   "both at [facility]  . LEFT HEART CATH AND CORONARY ANGIOGRAPHY N/A 12/25/2016   Procedure: LEFT HEART CATH AND CORONARY ANGIOGRAPHY;  Surgeon: Burnell Blanks, MD;  Location: [facility];  Service: Cardiovascular;  Laterality: N/A;  . LITHOTRIPSY    . LUMBAR LAMINECTOMY/DECOMPRESSION MICRODISCECTOMY N/A 06/09/2015   Procedure: Laminectomy - T12-L1;  Surgeon: Eustace Moore, MD;  Location: [facility];  Service: Neurosurgery;  Laterality: N/A;  Laminectomy - T12-L1  . MEDIAL PARTIAL KNEE REPLACEMENT Bilateral 2009-2010    [facility]   . MELANOMA EXCISION Right    "side of my nose"  . POSTERIOR LUMBAR FUSION  02/02/2012  . SHOULDER ARTHROSCOPY Left 1985  . TOTAL SHOULDER ARTHROPLASTY Left 12/06/2012   Procedure: LEFT TOTAL SHOULDER ARTHROPLASTY VERSES A REVERSE TOTAL SHOULDER ARTHROPLASTY;  Surgeon: Augustin Schooling, MD;  Location: [facility];  Service: Orthopedics;  Laterality: Left;    Prior to Admission medications   Medication Sig Start Date End Date Taking? Authorizing Provider  acetaminophen (TYLENOL) 325 MG tablet Take 2 tablets (650 mg total) by mouth every 4 (four) hours as needed for headache or mild pain. 12/27/16  Yes  Rama, Venetia Maxon, MD  amLODipine (NORVASC) 5 MG tablet Take 5 mg by mouth daily.   Yes [provider]  aspirin 81 MG chewable tablet Chew 1 tablet (81 mg total) by mouth daily. 12/28/16  Yes Rama, Venetia Maxon, MD  atorvastatin (LIPITOR) 80 MG tablet Take 1 tablet (80 mg total) by mouth daily at 6 PM. 12/27/16  Yes Rama, Venetia Maxon, MD  HUMALOG MIX 75/25 KWIKPEN (75-25) 100 UNIT/ML Kwikpen 20 UNITS TWO TIMES A DAY WITH A MEAL SUBCUTANEOUS 12/27/16  Yes [provider]  losartan-hydrochlorothiazide (HYZAAR) 100-12.5 MG tablet Take 1 tablet by mouth daily before breakfast. 12/27/16  Yes Rama, Venetia Maxon, MD  nitroGLYCERIN (NITROSTAT) 0.4 MG SL tablet Place 1 tablet (0.4 mg total) under the tongue every 5 (five) minutes as needed for chest pain. 12/27/16  Yes Rama, Venetia Maxon, MD  Omega-3 Fatty Acids (FISH OIL) 1000 MG CAPS Take 1,000 mg by mouth daily.   Yes [provider]  pantoprazole (PROTONIX) 40 MG tablet Take 40 mg by mouth 2 (two) times daily. 01/01/17  Yes [provider]  RESTASIS 0.05 % ophthalmic emulsion Place 1 drop 2 (two) times daily into both eyes. 10/25/16  Yes [provider]  ticagrelor (BRILINTA) 90 MG TABS tablet Take 1 tablet (90 mg total) by mouth 2 (two) times daily. 12/27/16  Yes Reino Bellis B, NP    Scheduled Meds: . amLODipine  5 mg Oral QHS  . aspirin EC  81 mg Oral Daily  . atorvastatin  80 mg Oral q1800  . cycloSPORINE  1 drop Both Eyes BID  . insulin aspart  0-9 Units Subcutaneous TID WC  . isosorbide mononitrate  30 mg Oral Daily  . [START ON 01/15/2017] pantoprazole  40 mg Intravenous Q12H  . sodium chloride flush  3 mL Intravenous Q12H  . ticagrelor  90 mg Oral BID   Continuous Infusions: . sodium chloride    . sodium chloride 20 mL/hr at 01/12/17 1135  . pantoprozole (PROTONIX) infusion 8 mg/hr (01/12/17 1308)   PRN Meds:.sodium chloride, acetaminophen, nitroGLYCERIN, ondansetron (ZOFRAN) IV, sodium  chloride flush  Allergies as of 01/11/2017 - Review Complete 01/11/2017  Allergen Reaction Noted  . Oxycodone Itching 06/01/2015  . Codeine Itching 09/23/2010  . Morphine and related Itching 09/23/2010    Family History  Problem Relation Age of Onset  . CAD Father   . CAD Sister     Social History   Socioeconomic History  . Marital status: Married    Spouse name: Not on file  . Number of children: Not on file  . Years of education: Not on file  . Highest education level: Not on file  Social Needs  . Financial resource strain: Not on file  . Food insecurity - worry: Not on file  . Food insecurity - inability: Not on file  . Transportation needs - medical: Not on file  . Transportation needs - non-medical: Not on file  Occupational  History  . Not on file  Tobacco Use  . Smoking status: Never Smoker  . Smokeless tobacco: Never Used  Substance and Sexual Activity  . Alcohol use: No  . Drug use: No  . Sexual activity: Yes  Other Topics Concern  . Not on file  Social History Narrative  . Not on file    Review of Systems: All negative except as stated above in HPI.  Physical Exam: Vital signs: Vitals:   01/12/17 0920 01/12/17 1126  BP: (!) 144/70 122/69  Pulse: 70 66  Resp: 18 18  Temp: 97.6 F (36.4 C) 98.1 F (36.7 C)  SpO2: 100% 99%   Last BM Date: 01/12/11 General:   Alert,  Well-developed, well-nourished, pleasant and cooperative in NAD Head: normocephalic, atraumatic Eyes: anicteric sclera ENT: oropharynx clear Neck: supple, nontender Lungs:  Clear throughout to auscultation.   No wheezes, crackles, or rhonchi. No acute distress. Heart:  Regular rate and rhythm; no murmurs, clicks, rubs,  or gallops. Abdomen: soft, nontender, nondistended, +BS  Rectal:  Deferred Ext: no edema  GI:  Lab Results: Recent Labs    01/11/17 1047 01/12/17 0401  WBC 7.9 7.1  HGB 11.3* 10.8*  HCT 33.7* 32.3*  PLT 335 328   BMET Recent Labs    01/11/17 1047  01/12/17 0401  NA 136 138  K 3.5 3.5  CL 100* 101  CO2 25 28  GLUCOSE 232* 123*  BUN 31* 25*  CREATININE 1.48* 1.35*  CALCIUM 9.6 9.6   LFT Recent Labs    01/12/17 0401  PROT 6.3*  ALBUMIN 3.5  AST 21  ALT 18  ALKPHOS 82  BILITOT 0.8  BILIDIR 0.2  IBILI 0.6   PT/INR Recent Labs    01/11/17 1047  LABPROT 13.1  INR 1.00     Studies/Results: Dg Chest Port 1 View  Result Date: 01/11/2017 CLINICAL DATA:  Shortness of breath and chest pain. EXAM: PORTABLE CHEST 1 VIEW COMPARISON:  PA and lateral chest 12/23/2016 and 06/03/2015. FINDINGS: The lungs are clear. Heart size is normal. Aortic atherosclerosis is noted. No pneumothorax or pleural effusion. Reverse left shoulder arthroplasty is in place. Degenerative change right glenohumeral joint noted. IMPRESSION: No acute disease. Atherosclerosis. Electronically Signed   By: Inge Rise M.D.   On: 01/11/2017 12:26    Impression/Plan: 76 yo with melenic stools in the setting of Brilinta from a recent coronary stent. GI bleed concerning for peptic ulcer disease. EGD today to further evaluate. Protonix drip. NPO for EGD. Follow H/Hs. Risks/benefits of EGD discussed with patient and family and they agree to proceed.    LOS: 0 days   Bradley C.  01/12/2017, 1:12 PM  Pager [phone]  AFTER 5 pm or on weekends please call [phone]

## 2017-01-13 DIAGNOSIS — I2510 Atherosclerotic heart disease of native coronary artery without angina pectoris: Secondary | ICD-10-CM

## 2017-01-13 LAB — IRON AND TIBC
Iron: 42 ug/dL — ABNORMAL LOW (ref 45–182)
Saturation Ratios: 14 % — ABNORMAL LOW (ref 17.9–39.5)
TIBC: 302 ug/dL (ref 250–450)
UIBC: 260 ug/dL

## 2017-01-13 LAB — CBC
HCT: 30.5 % — ABNORMAL LOW (ref 39.0–52.0)
Hemoglobin: 10.1 g/dL — ABNORMAL LOW (ref 13.0–17.0)
MCH: 30.1 pg (ref 26.0–34.0)
MCHC: 33.1 g/dL (ref 30.0–36.0)
MCV: 90.8 fL (ref 78.0–100.0)
Platelets: 281 10*3/uL (ref 150–400)
RBC: 3.36 MIL/uL — ABNORMAL LOW (ref 4.22–5.81)
RDW: 13.7 % (ref 11.5–15.5)
WBC: 5.6 10*3/uL (ref 4.0–10.5)

## 2017-01-13 LAB — GLUCOSE, CAPILLARY
Glucose-Capillary: 104 mg/dL — ABNORMAL HIGH (ref 65–99)
Glucose-Capillary: 165 mg/dL — ABNORMAL HIGH (ref 65–99)

## 2017-01-13 LAB — RETICULOCYTES
RBC.: 3.25 MIL/uL — ABNORMAL LOW (ref 4.22–5.81)
Retic Count, Absolute: 107.3 10*3/uL (ref 19.0–186.0)
Retic Ct Pct: 3.3 % — ABNORMAL HIGH (ref 0.4–3.1)

## 2017-01-13 LAB — VITAMIN B12: Vitamin B-12: 248 pg/mL (ref 180–914)

## 2017-01-13 LAB — FOLATE: Folate: 26 ng/mL (ref >5.9)

## 2017-01-13 LAB — FERRITIN: Ferritin: 49 ng/mL (ref 24–336)

## 2017-01-13 MED ORDER — ACETAMINOPHEN 325 MG PO TABS: 650.0000 mg | ORAL_TABLET | Freq: Four times a day (QID) | ORAL | Status: DC | PRN

## 2017-01-13 MED ORDER — ISOSORBIDE MONONITRATE ER 30 MG PO TB24
30.0000 mg | ORAL_TABLET | Freq: Every day | ORAL | 5 refills | Status: DC
Start: 2017-01-14 — End: 2017-07-11

## 2017-01-13 MED ORDER — PANTOPRAZOLE SODIUM 40 MG PO TBEC
40.0000 mg | DELAYED_RELEASE_TABLET | Freq: Two times a day (BID) | ORAL | Status: DC
  Administered 2017-01-13: 40 mg via ORAL
  Filled 2017-01-13: qty 1

## 2017-01-13 NOTE — Progress Notes (Signed)
Spoke to [employer] [place] with plan from GI standpoint to d/c onf Brilinta Said to have pt call/come to their office for CBC on Tues/ Wed depending on snow Further decisions re pill endoscopy based on this result WIll make sure that pt has f/u in cardiology in next 2 wks. D/C today

## 2017-01-13 NOTE — Progress Notes (Signed)
MD stated to discontinued continuous IV Protonix drip and give PO  Verbal order placed

## 2017-01-13 NOTE — Progress Notes (Signed)
Progress Note  Patient Name: Matthew [name] Date of Encounter: 01/13/2017  Primary Cardiologist: Lauree Chandler, MD   Subjective   Breahting is OK  No CP    Inpatient Medications    Scheduled Meds: . amLODipine  5 mg Oral QHS  . aspirin EC  81 mg Oral Daily  . atorvastatin  80 mg Oral q1800  . cycloSPORINE  1 drop Both Eyes BID  . insulin aspart  0-9 Units Subcutaneous TID WC  . isosorbide mononitrate  30 mg Oral Daily  . [START ON 01/15/2017] pantoprazole  40 mg Intravenous Q12H  . sodium chloride flush  3 mL Intravenous Q12H  . ticagrelor  90 mg Oral BID   Continuous Infusions: . sodium chloride    . pantoprozole (PROTONIX) infusion 8 mg/hr (01/13/17 0027)   PRN Meds: sodium chloride, acetaminophen, nitroGLYCERIN, ondansetron (ZOFRAN) IV, sodium chloride flush   Vital Signs    Vitals:   01/12/17 1600 01/12/17 1700 01/12/17 1941 01/13/17 0439  BP: 124/67 112/80 114/64 125/73  Pulse: 68 74 81 77  Resp:   18 18  Temp:   98 F (36.7 C) (!) 97.5 F (36.4 C)  TempSrc:   Oral Oral  SpO2:   99% 97%  Weight:    173 lb (78.5 kg)  Height:        Intake/Output Summary (Last 24 hours) at 01/13/2017 0800 Last data filed at 01/13/2017 0600 Gross per 24 hour  Intake 433.59 ml  Output 350 ml  Net 83.59 ml   Filed Weights   01/11/17 2308 01/12/17 0627 01/13/17 0439  Weight: 174 lb 6.4 oz (79.1 kg) 173 lb 8 oz (78.7 kg) 173 lb (78.5 kg)    Telemetry    SR   - Personally Reviewed  ECG      Physical Exam   GEN: No acute distress.   Neck: No JVD Cardiac: RRR, no murmurs, rubs, or gallops.  Respiratory: Clear to auscultation bilaterally. GI: Soft, nontender, non-distended  MS: No edema; No deformity. Neuro:  Nonfocal  Psych: Normal affect   Labs    Chemistry Recent Labs  Lab 01/11/17 1047 01/12/17 0401  NA 136 138  K 3.5 3.5  CL 100* 101  CO2 25 28  GLUCOSE 232* 123*  BUN 31* 25*  CREATININE 1.48* 1.35*  CALCIUM 9.6 9.6  PROT  --  6.3*    ALBUMIN  --  3.5  AST  --  21  ALT  --  18  ALKPHOS  --  82  BILITOT  --  0.8  GFRNONAA 45* 50*  GFRAA 52* 58*  ANIONGAP 11 9     Hematology Recent Labs  Lab 01/11/17 1047 01/12/17 0401 01/13/17 0336  WBC 7.9 7.1 5.6  RBC 3.76* 3.57* 3.36*  HGB 11.3* 10.8* 10.1*  HCT 33.7* 32.3* 30.5*  MCV 89.6 90.5 90.8  MCH 30.1 30.3 30.1  MCHC 33.5 33.4 33.1  RDW 13.3 13.6 13.7  PLT 335 328 281    Cardiac Enzymes Recent Labs  Lab 01/11/17 1047 01/11/17 1755 01/11/17 2121  TROPONINI <0.03 <0.03 <0.03   No results for input(s): TROPIPOC in the last 168 hours.   BNPNo results for input(s): BNP, PROBNP in the last 168 hours.   DDimer No results for input(s): DDIMER in the last 168 hours.   Radiology    Dg Chest Port 1 View  Result Date: 01/11/2017 CLINICAL DATA:  Shortness of breath and chest pain. EXAM: PORTABLE CHEST 1 VIEW COMPARISON:  PA and lateral chest 12/23/2016 and 06/03/2015. FINDINGS: The lungs are clear. Heart size is normal. Aortic atherosclerosis is noted. No pneumothorax or pleural effusion. Reverse left shoulder arthroplasty is in place. Degenerative change right glenohumeral joint noted. IMPRESSION: No acute disease. Atherosclerosis. Electronically Signed   By: Inge Rise M.D.   On: 01/11/2017 12:26    Cardiac Studies     Patient Profile      Matthew [name] a 76 y.o.malewith history of type 2 diabetes, hypertension, CKD II, kidney stones, basal cell of the nose, CAD/NSTEMI s/p stenting of LAD/RCA and PTCA of PL branch 12/2016, RBBB, hyperlipidemia who presented for post-hospital follow-up 01/11/17. Transferred to ER for recurrent anginal symptoms which began shortly after discharge, particularly in the setting of melena for over 1 week. He had endoscopy planned but this was cancelled due to being on ASA/Brilinta.    Assessment & Plan    1  CAD Pt presented with chest discomfort , fatigue, SOB  Has r/o for MI  Pt did not have cath done as trop neg  and melena. Since admit he has had no anginal symtpoms    I would recomm ambulating and follow  If recurs plan can  2  Melena  EGD showed ulcer but no active bleed  Also gastritis  Plan for protonix     3  Anemia  Hgb has trickled down since admit  No obvious bleeding  Will need to follow closely  Pt had colonscopy earier this year     Would need to look at other sites  (small bowel)if continues  To drop Get anemia panel  May supplement Fe 3  HL On statin  4  HTN  BP OK    For questions or updates, please contact [facility] Please consult [email or web address] for contact info under Cardiology/STEMI.      Signed, Dorris Carnes, MD  01/13/2017, 8:00 AM

## 2017-01-13 NOTE — Progress Notes (Signed)
Patient ambulated in hall without problem I disccussed pt's medical details with Matthew [name]   REcomm:  Stop ASA COntinue Brilinta Close f/u of blood counts I have placed call to GI to get f/u on further testing  Could d/c from cardiac standpoint if OK from GI.

## 2017-01-13 NOTE — Progress Notes (Signed)
Don Broach 1:35 PM  Subjective: Patient with black stools since his stents and since he's been on his blood thinner and aspirin and his hospital computer chart reviewed and his case discussed with my partner Dr. Michail Sermon and he has no other GI complaints  Objective: Vital signs stable afebrile no acute distress abdomen is soft nontender hemoglobin slight decreased BUN and creatinine slight decrease as well  Assessment: Black stools on blood thinners  Plan: Would check with cardiology to see if they are willing to change his blood thinners or try other ones and if he continues to have signs of bleeding happy to proceed either as inpatient or outpatient with capsule endoscopy and the procedure was discussed with the patient and his friend and I will check to computer tomorrow if he is still here otherwise we are happy to see back as an outpatient if black stools or signs of bleeding continue or we can just set up the test over the phone and we cautioned him about nonsteroidals and answered all of his questions  [facility]  Pager [phone] After 5PM or if no answer call [phone]

## 2017-01-13 NOTE — Progress Notes (Signed)
Pt has orders to be discharged. Discharge instructions given and pt has no additional questions at this time. Medication regimen reviewed and pt educated. Pt verbalized understanding and has no additional questions. Telemetry box removed. IV removed and site in good condition. Pt stable and waiting for transportation.  Indio Santilli RN 

## 2017-01-13 NOTE — Telephone Encounter (Signed)
Patient had endoscopy in hospital. Will remove from pre-op box. Kol Consuegra PA-C

## 2017-01-13 NOTE — Progress Notes (Signed)
Paged card PA again regarding discharge. PA is working on discharge now.

## 2017-01-13 NOTE — Plan of Care (Signed)
  Clinical Measurements: Ability to maintain clinical measurements within normal limits will improve 01/13/2017 0605 - Progressing by Nuri Larmer, Roma Kayser, RN

## 2017-01-13 NOTE — Progress Notes (Signed)
Pt wife approached RN wanting to know about discharge. RN informed pt and wife that cards would be paged regarding d/c.  Spoke to Kerin Ransom, [place] who is working on discharge. Pt informed. Verbalized understanding.

## 2017-01-15 ENCOUNTER — Encounter (HOSPITAL_COMMUNITY): Payer: Self-pay | Admitting: Gastroenterology

## 2017-01-15 LAB — H. PYLORI ANTIBODY, IGG: H Pylori IgG: <0.8 Index Value (ref 0.00–0.79)

## 2017-01-16 DIAGNOSIS — I4510 Unspecified right bundle-branch block: Secondary | ICD-10-CM | POA: Diagnosis present

## 2017-01-16 DIAGNOSIS — K279 Peptic ulcer, site unspecified, unspecified as acute or chronic, without hemorrhage or perforation: Secondary | ICD-10-CM | POA: Diagnosis present

## 2017-01-16 NOTE — Discharge Summary (Addendum)
Discharge Summary    Patient ID: Matthew [name],  MRN: [id number], DOB/AGE: December 20, 1940 76 y.o.  Admit date: 01/11/2017 Discharge date: 01/16/2017  Primary Care Provider: Wenda Low Primary Cardiologist: Lauree Chandler, MD  Discharge Diagnoses    Principal Problem:   Melena Active Problems:   PUD (peptic ulcer disease)   Unstable angina ([place])   History of NSTEMI-12/25/16   CAD- s/p recent LAD/RCA PCI with DES   DM2 (diabetes mellitus, type 2) ([place])   HTN (hypertension)   Hyperlipidemia   RBBB      Allergies Allergies  Allergen Reactions  . Oxycodone Itching  . Codeine Itching  . Morphine And Related Itching    Diagnostic Studies/Procedures    Endoscopy 01/12/17 _____________   History of Present Illness     76 y/o male with a history of recent MI and PCI with DES admitted from the office with chest pain and melana.   Hospital Course     Consultants: Dr Michail Sermon  76 y.o.malewith history of CAD, recently admitted 12/25/16 with a NSTEMI- s/p DES of LAD x 3 on 12/25/16 followed by staged RCA PCI with DES and PTCA of PL branch on 12/26/16.  Other medical problems include NIDDM, HTN, RBBB, and HLD. The pt was discharged 12/27/16. He called the office on 01/03/17 at which time he reported thick black stools. He was advised to go to the ED. He reports his PCP started him on Protonix 40mg  BID. He reports he saw [facility] and was set up for endoscopy but he was told by anesthesiology that they would not touch him for 6 months since he was on [place]. Instead of going to ED he preferred to f/u in the office and was seen 01/11/17 by Melina Copa. The pt also complained of fatigue with exertion and chest discomfort. After consultation with Dr Julianne Handler it was decided to admit the patient for further evaluation and possible re look cath.   He was admitted and his chest discomfort was relieved with Nitrates. His Troponin was negative. His Hgb was 10.8 on admission. He  was seen in consult by [facility] and underwent endoscopy 01/12/17 which revealed a clean based antral ulcer with gastritis. No active bleeding and no blood products were seen. After discussion it was determined that he could be discharged 12/08, off ASA and on Brilinta, with plans for close follow up with GI as an OP. His Hgb at discharge was 10.1. His Losartan HCTZ was held at discharge as his SCr was 1.48 on admission. This may need to be resumed as an OP depending on his B/P. We'll arrange for an OV with cardiology in 1-2 weeks as well.   _____________  Discharge Vitals Blood pressure 130/77, pulse 86, temperature 98 F (36.7 C), temperature source Oral, resp. rate 18, height 5\' 9"  (1.753 m), weight 173 lb (78.5 kg), SpO2 98 %.  Filed Weights   01/11/17 2308 01/12/17 0627 01/13/17 0439  Weight: 174 lb 6.4 oz (79.1 kg) 173 lb 8 oz (78.7 kg) 173 lb (78.5 kg)    Labs & Radiologic Studies    CBC No results for input(s): WBC, NEUTROABS, HGB, HCT, MCV, PLT in the last 72 hours. Basic Metabolic Panel No results for input(s): NA, K, CL, CO2, GLUCOSE, BUN, CREATININE, CALCIUM, MG, PHOS in the last 72 hours. Liver Function Tests No results for input(s): AST, ALT, ALKPHOS, BILITOT, PROT, ALBUMIN in the last 72 hours. No results for input(s): LIPASE, AMYLASE in the last 72  hours. Cardiac Enzymes No results for input(s): CKTOTAL, CKMB, CKMBINDEX, TROPONINI in the last 72 hours. BNP Invalid input(s): POCBNP D-Dimer No results for input(s): DDIMER in the last 72 hours. Hemoglobin A1C No results for input(s): HGBA1C in the last 72 hours. Fasting Lipid Panel No results for input(s): CHOL, HDL, LDLCALC, TRIG, CHOLHDL, LDLDIRECT in the last 72 hours. Thyroid Function Tests No results for input(s): TSH, T4TOTAL, T3FREE, THYROIDAB in the last 72 hours.  Invalid input(s): FREET3 _____________  Dg Chest 2 View  Result Date: 12/23/2016 CLINICAL DATA:  76 year old male with chest pain and shortness  of breath. EXAM: CHEST  2 VIEW COMPARISON:  Chest radiograph dated 06/03/2015 FINDINGS: The lungs are clear. There is no pleural effusion or pneumothorax. The cardiac silhouette is within normal limits. There is atherosclerotic calcification of the aortic arch. There is degenerative changes of the spine. There is a left shoulder arthroplasty. No acute osseous pathology. IMPRESSION: No active cardiopulmonary disease. Electronically Signed   By: Anner Crete M.D.   On: 12/23/2016 22:19   Dg Chest Port 1 View  Result Date: 01/11/2017 CLINICAL DATA:  Shortness of breath and chest pain. EXAM: PORTABLE CHEST 1 VIEW COMPARISON:  PA and lateral chest 12/23/2016 and 06/03/2015. FINDINGS: The lungs are clear. Heart size is normal. Aortic atherosclerosis is noted. No pneumothorax or pleural effusion. Reverse left shoulder arthroplasty is in place. Degenerative change right glenohumeral joint noted. IMPRESSION: No acute disease. Atherosclerosis. Electronically Signed   By: Inge Rise M.D.   On: 01/11/2017 12:26   Disposition   Pt is being discharged home today in good condition.  Follow-up Plans & Appointments    Follow-up Information    Burnell Blanks, MD Follow up.   Specialty:  Cardiology Why:  office will contact you Contact information: [place]. [address] [place] [phone]            Discharge Medications   Allergies as of 01/13/2017      Reactions   Oxycodone Itching   Codeine Itching   Morphine And Related Itching      Medication List    STOP taking these medications   aspirin 81 MG chewable tablet   losartan-hydrochlorothiazide 100-12.5 MG tablet Commonly known as:  HYZAAR     TAKE these medications   acetaminophen 325 MG tablet Commonly known as:  TYLENOL Take 2 tablets (650 mg total) by mouth every 6 (six) hours as needed for mild pain or headache. What changed:  when to take this   amLODipine 5 MG tablet Commonly known  as:  NORVASC Take 5 mg by mouth daily.   atorvastatin 80 MG tablet Commonly known as:  LIPITOR Take 1 tablet (80 mg total) by mouth daily at 6 PM.   Fish Oil 1000 MG Caps Take 1,000 mg by mouth daily.   HUMALOG MIX 75/25 KWIKPEN (75-25) 100 UNIT/ML Kwikpen Generic drug:  Insulin Lispro Prot & Lispro 20 UNITS TWO TIMES A DAY WITH A MEAL SUBCUTANEOUS   isosorbide mononitrate 30 MG 24 hr tablet Commonly known as:  IMDUR Take 1 tablet (30 mg total) by mouth daily.   nitroGLYCERIN 0.4 MG SL tablet Commonly known as:  NITROSTAT Place 1 tablet (0.4 mg total) under the tongue every 5 (five) minutes as needed for chest pain.   pantoprazole 40 MG tablet Commonly known as:  PROTONIX Take 40 mg by mouth 2 (two) times daily.   RESTASIS 0.05 % ophthalmic emulsion Generic drug:  cycloSPORINE Place  1 drop 2 (two) times daily into both eyes.   ticagrelor 90 MG Tabs tablet Commonly known as:  BRILINTA Take 1 tablet (90 mg total) by mouth 2 (two) times daily.         Outstanding Labs/Studies     Duration of Discharge Encounter   Greater than 30 minutes including physician time.  Angelena Form PA 01/16/2017, 9:07 AM  Patient seen earlier in day  Agree with plans as noted above  Pt is stable for discharge  Dorris Carnes

## 2017-02-01 ENCOUNTER — Ambulatory Visit: Payer: Medicare Other | Admitting: Cardiology

## 2017-02-01 ENCOUNTER — Encounter: Payer: Self-pay | Admitting: Cardiology

## 2017-02-01 ENCOUNTER — Other Ambulatory Visit: Payer: Self-pay | Admitting: Gastroenterology

## 2017-02-01 ENCOUNTER — Ambulatory Visit
Admission: RE | Admit: 2017-02-01 | Discharge: 2017-02-01 | Disposition: A | Discharge status: Home / Self Care | Payer: Medicare Other | Source: Ambulatory Visit | Attending: Gastroenterology | Admitting: Gastroenterology

## 2017-02-01 VITALS — BP 152/84 | HR 94 | Ht 69.0 in | Wt 189.0 lb

## 2017-02-01 DIAGNOSIS — N289 Disorder of kidney and ureter, unspecified: Secondary | ICD-10-CM | POA: Diagnosis not present

## 2017-02-01 DIAGNOSIS — Z9889 Other specified postprocedural states: Secondary | ICD-10-CM

## 2017-02-01 DIAGNOSIS — I2510 Atherosclerotic heart disease of native coronary artery without angina pectoris: Secondary | ICD-10-CM

## 2017-02-01 DIAGNOSIS — Z9861 Coronary angioplasty status: Secondary | ICD-10-CM

## 2017-02-01 DIAGNOSIS — D649 Anemia, unspecified: Secondary | ICD-10-CM

## 2017-02-01 LAB — CBC
Hematocrit: 30.4 % — ABNORMAL LOW (ref 37.5–51.0)
Hemoglobin: 10.1 g/dL — ABNORMAL LOW (ref 13.0–17.7)
MCH: 29.7 pg (ref 26.6–33.0)
MCHC: 33.2 g/dL (ref 31.5–35.7)
MCV: 89 fL (ref 79–97)
Platelets: 261 10*3/uL (ref 150–379)
RBC: 3.4 x10E6/uL — ABNORMAL LOW (ref 4.14–5.80)
RDW: 14.1 % (ref 12.3–15.4)
WBC: 4.9 10*3/uL (ref 3.4–10.8)

## 2017-02-01 LAB — BASIC METABOLIC PANEL
BUN/Creatinine Ratio: 11 (ref 10–24)
BUN: 14 mg/dL (ref 8–27)
CO2: 25 mmol/L (ref 20–29)
Calcium: 9.5 mg/dL (ref 8.6–10.2)
Chloride: 104 mmol/L (ref 96–106)
Creatinine, Ser: 1.23 mg/dL (ref 0.76–1.27)
GFR calc Af Amer: 66 mL/min/{1.73_m2} (ref >59)
GFR calc non Af Amer: 57 mL/min/{1.73_m2} — ABNORMAL LOW (ref >59)
Glucose: 112 mg/dL — ABNORMAL HIGH (ref 65–99)
Potassium: 4.2 mmol/L (ref 3.5–5.2)
Sodium: 143 mmol/L (ref 134–144)

## 2017-02-01 MED ORDER — METOPROLOL TARTRATE 25 MG PO TABS: 25.0000 mg | ORAL_TABLET | Freq: Two times a day (BID) | ORAL | 3 refills | Status: DC

## 2017-02-01 NOTE — Progress Notes (Signed)
02/01/2017 Don Broach   September 05, 1940  [id number]  Primary Physician Wenda Low, MD Primary Cardiologist: Dr. Angelena Form   Reason for Visit/CC: F/u for CAD.   HPI:  Matthew [name] is a 76 y.o. male who is being seen today for post hospital f/u. PMH includes DM and HTN and newly diagnosed CAD. He was initially admitted 12/25/2016 for NSTEMI.  He had a cardiac cath on 12/25/16 and was found to have  severe stenosis in the mid LAD treated with 3 drug eluting stents. He had staged PCI of the RCA on 12/26/16 and had a drug eluting stent placed in the mid RCA and balloon angioplasty of the PLA lesion, no stent placed. Echo with normal LV function  He was placed on DAPT w/ ASA and Brilinta and developed melena and recurrent CP and was readmitted 01/11/17. Chest Pain resolved with nitrate therapy. Cardiac enzymes were negative. He did not require relook cath. He had a slight drop in Hgb down to 10. GI was consulted and he underwent an EGD and was found to have a non bleeding ulcer and gastritis. He was started on Protonix. GI recommended discontinuation of ASA and cardiology felt this was reasonable. He was continued on Brilinta. Additional medication changes included discontinuation of his losartan due to mild renal insuffiencey with bump in SCr to 1.4.   He presents back to clinic today for f/u. He is here with his wife. He denies any recurrent melena but has however had mild occasional chest tightness, lasting < 1 min at a time. No required use of SL NTG. He is on LA nitate therapy with Imdur, 30 mg. He is not on a BB. BP is elevated in the 643P systolic and resting HR in the 80s. He reports full compliance with Brilinta and statin.   Current Meds  Medication Sig  . acetaminophen (TYLENOL) 325 MG tablet Take 2 tablets (650 mg total) by mouth every 6 (six) hours as needed for mild pain or headache.  Marland Kitchen amLODipine (NORVASC) 5 MG tablet Take 5 mg by mouth daily.  Marland Kitchen atorvastatin (LIPITOR) 80 MG tablet  Take 1 tablet (80 mg total) by mouth daily at 6 PM.  . HUMALOG MIX 75/25 KWIKPEN (75-25) 100 UNIT/ML Kwikpen 20 UNITS TWO TIMES A DAY WITH A MEAL SUBCUTANEOUS  . isosorbide mononitrate (IMDUR) 30 MG 24 hr tablet Take 1 tablet (30 mg total) by mouth daily.  . nitroGLYCERIN (NITROSTAT) 0.4 MG SL tablet Place 1 tablet (0.4 mg total) under the tongue every 5 (five) minutes as needed for chest pain.  . Omega-3 Fatty Acids (FISH OIL) 1000 MG CAPS Take 1,000 mg by mouth daily.  . pantoprazole (PROTONIX) 40 MG tablet Take 40 mg by mouth 2 (two) times daily.  . ticagrelor (BRILINTA) 90 MG TABS tablet Take 1 tablet (90 mg total) by mouth 2 (two) times daily.   Allergies  Allergen Reactions  . Oxycodone Itching  . Codeine Itching  . Morphine And Related Itching   Past Medical History:  Diagnosis Date  . Arthritis    Shoulder, knees, back (01/11/2017)  . CAD in native artery    a. CAD/NSTEMI s/p stenting of LAD then staged stenting to RCA and PTCA of PL branch 12/2016, EF 60-65%.  . Chronic lower back pain   . CKD (chronic kidney disease), stage II    Stage II, per Dr. 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 Complication of anesthesia    "Too much with shoulder surgery", pt. reports that he was  told the at they "lost him, due to absorbing too much anesthesia". Not enough for  Right ,  Left knee  just right.  . Constipation    occ  . Dysrhythmia    has been irregular at times since age 38.  Marland Kitchen History of kidney stones   . Hyperlipidemia   . Hypertension   . 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 Myocardial infarction ([place]) 12/2016 X 2  . RBBB   . Type II diabetes mellitus (HCC)    Family History  Problem Relation Age of Onset  . CAD Father   . CAD Sister    Past Surgical History:  Procedure Laterality Date  . ANAL FISSURE REPAIR  X 2  . BACK SURGERY    . CATARACT EXTRACTION W/ INTRAOCULAR LENS  IMPLANT, BILATERAL Bilateral   . COLONOSCOPY WITH PROPOFOL N/A 06/05/2016    Procedure: COLONOSCOPY WITH PROPOFOL;  Surgeon: Garlan Fair, MD;  Location: [facility];  Service: Endoscopy;  Laterality: N/A;  . CORONARY ANGIOGRAPHY N/A 12/26/2016   Procedure: CORONARY ANGIOGRAPHY;  Surgeon: Troy Sine, MD;  Location: [place] [facility];  Service: Cardiovascular;  Laterality: N/A;  . CORONARY ANGIOPLASTY WITH STENT PLACEMENT     "I've got a total of 4 stents" (01/11/2017)  . CORONARY STENT INTERVENTION N/A 12/25/2016   Procedure: CORONARY STENT INTERVENTION;  Surgeon: Burnell Blanks, MD;  Location: [place] [facility];  Service: Cardiovascular;  Laterality: N/A;  . CORONARY STENT INTERVENTION N/A 12/26/2016   Procedure: CORONARY STENT INTERVENTION;  Surgeon: Troy Sine, MD;  Location: [place] [facility];  Service: Cardiovascular;  Laterality: N/A;  . ESOPHAGOGASTRODUODENOSCOPY (EGD) WITH PROPOFOL N/A 01/12/2017   Procedure: ESOPHAGOGASTRODUODENOSCOPY (EGD) WITH PROPOFOL;  Surgeon: Wilford Corner, MD;  Location: [facility];  Service: Endoscopy;  Laterality: N/A;  . HAND LIGAMENT RECONSTRUCTION Right X 2  . KNEE ARTHROSCOPY Bilateral 2009-2010   "both at [facility]  . LEFT HEART CATH AND CORONARY ANGIOGRAPHY N/A 12/25/2016   Procedure: LEFT HEART CATH AND CORONARY ANGIOGRAPHY;  Surgeon: Burnell Blanks, MD;  Location: [place] [facility];  Service: Cardiovascular;  Laterality: N/A;  . LITHOTRIPSY    . LUMBAR LAMINECTOMY/DECOMPRESSION MICRODISCECTOMY N/A 06/09/2015   Procedure: Laminectomy - T12-L1;  Surgeon: Eustace Moore, MD;  Location: [facility];  Service: Neurosurgery;  Laterality: N/A;  Laminectomy - T12-L1  . MEDIAL PARTIAL KNEE REPLACEMENT Bilateral 2009-2010    [facility]   . MELANOMA EXCISION Right    "side of my nose"  . POSTERIOR LUMBAR FUSION  02/02/2012  . SHOULDER ARTHROSCOPY Left 1985  . TOTAL SHOULDER ARTHROPLASTY Left 12/06/2012   Procedure: LEFT TOTAL SHOULDER ARTHROPLASTY VERSES A REVERSE TOTAL SHOULDER ARTHROPLASTY;   Surgeon: Augustin Schooling, MD;  Location: [place];  Service: Orthopedics;  Laterality: Left;   Social History   Socioeconomic History  . Marital status: Married    Spouse name: Not on file  . Number of children: Not on file  . Years of education: Not on file  . Highest education level: Not on file  Social Needs  . Financial resource strain: Not on file  . Food insecurity - worry: Not on file  . Food insecurity - inability: Not on file  . Transportation needs - medical: Not on file  . Transportation needs - non-medical: Not on file  Occupational History  . Not on file  Tobacco Use  . Smoking status: Never  Smoker  . Smokeless tobacco: Never Used  Substance and Sexual Activity  . Alcohol use: No  . Drug use: No  . Sexual activity: Yes  Other Topics Concern  . Not on file  Social History Narrative  . Not on file     Review of Systems: General: negative for chills, fever, night sweats or weight changes.  Cardiovascular: negative for chest pain, dyspnea on exertion, edema, orthopnea, palpitations, paroxysmal nocturnal dyspnea or shortness of breath Dermatological: negative for rash Respiratory: negative for cough or wheezing Urologic: negative for hematuria Abdominal: negative for nausea, vomiting, diarrhea, bright red blood per rectum, melena, or hematemesis Neurologic: negative for visual changes, syncope, or dizziness All other systems reviewed and are otherwise negative except as noted above.   Physical Exam:  Blood pressure (!) 152/84, pulse 94, height 5\' 9"  (1.753 m), weight 189 lb (85.7 kg), SpO2 98 %.  General appearance: alert, cooperative and no distress Neck: no carotid bruit and no JVD Lungs: clear to auscultation bilaterally Heart: regular rate and rhythm, S1, S2 normal, no murmur, click, rub or gallop Extremities: extremities normal, atraumatic, no cyanosis or edema Pulses: 2+ and symmetric Skin: Skin color, texture, turgor normal. No rashes or  lesions Neurologic: Grossly normal  EKG not performed-- personally reviewed   ASSESSMENT AND PLAN:   1. CAD: admitted 12/25/2016 for NSTEMI.  He had a cardiac cath on 12/25/16 and was found to have  severe stenosis in the mid LAD treated with 3 drug eluting stents. He had staged PCI of the RCA on 12/26/16 and had a drug eluting stent placed in the mid RCA and balloon angioplasty of the PLA lesion, no stent placed. Echo with normal LV function. He has had some occasional chest tightness occurring at rest, short lived and resolves w/o intervention. Currently pain free. He has not had to use SL NTG. Meds reviewed. He does not appear to be on a BB. SBP in the 150s and HR in the 80s. We will add metoprolol 25 mg BID. Continue Imdur 30 mg, high dose statin and Brilinta. No ASA given recent GIB. He will call if symptoms worsen.   2. Anemia/GIB: EGD showed ulcer and gastritis. ASA discontinued by GI. PPI added. He denies any further melena. Will check CBC today to recheck H/H.   3. Renal Insuffiencey: SCr bumped to 1.4 last admission and his ARB/HCTZ was discontinued. Will check f/u BMP today.   Follow-Up w/ Dr. Angelena Form in 2-3 months   Andreanna Mikolajczak Ladoris Gene, [facility] 02/01/2017 9:39 AM

## 2017-02-01 NOTE — Patient Instructions (Signed)
Medication Instructions:  START Metoprolol tartrate (Lopressor) 25 mg by mouth twice daily. Prescription has been sent to your pharmacy  Labwork: Today: CBC, BMET  Testing/Procedures: None ordered  Follow-Up: Your physician recommends that you schedule a follow-up appointment with Dr. Angelena Form first available   Any Other Special Instructions Will Be Listed Below (If Applicable).  Monitor your heart rate at home. If it drops into the 50's and you have symptoms, please call our office for further instructions at [phone]   If you need a refill on your cardiac medications before your next appointment, please call your pharmacy.

## 2017-02-12 ENCOUNTER — Telehealth: Payer: Self-pay | Admitting: *Deleted

## 2017-02-12 NOTE — Telephone Encounter (Signed)
   [facility] Pre-operative Risk Assessment    Request for surgical clearance:  1. What type of surgery is being performed? ESI Lumbar   2. When is this surgery scheduled? 02/20/17   3. Are there any medications that need to be held prior to surgery and how long?Brilinta-5 days prior  4. Practice name and name of physician performing surgery? [facility]. Dr. Brien Few   5. What is your office phone and fax number? Phone-[phone]. [phone]   6. Anesthesia type (None, local, MAC, general) ? Not noted on form    _________________________________________________________________

## 2017-02-13 NOTE — Telephone Encounter (Signed)
   Primary Cardiologist: Lauree Chandler, MD  Chart reviewed as part of pre-operative protocol coverage. Due to pt undergoing recent PCI with drug eluting stent 12/2016, he will NOT be able to stop Brilinta at this time. He will need to continue this for at least 12 months w/u interruption.  Thus he is not cleared to get  Spinal injection due to medication. Pt was notified of this at his recent office visit with me.   I will route info to Dr. Conchita Paris at [phone]   Lyda Jester, PA-C 02/13/2017, 2:02 PM

## 2017-03-01 ENCOUNTER — Encounter (HOSPITAL_COMMUNITY): Payer: Medicare Other

## 2017-05-14 ENCOUNTER — Ambulatory Visit: Payer: Medicare Other | Admitting: Cardiovascular Disease

## 2017-05-24 ENCOUNTER — Ambulatory Visit: Payer: Medicare Other | Admitting: Cardiovascular Disease

## 2017-05-24 ENCOUNTER — Encounter: Payer: Self-pay | Admitting: Cardiovascular Disease

## 2017-05-24 VITALS — BP 132/70 | HR 56 | Ht 69.0 in | Wt 188.2 lb

## 2017-05-24 DIAGNOSIS — E785 Hyperlipidemia, unspecified: Secondary | ICD-10-CM | POA: Diagnosis not present

## 2017-05-24 DIAGNOSIS — I2510 Atherosclerotic heart disease of native coronary artery without angina pectoris: Secondary | ICD-10-CM

## 2017-05-24 NOTE — Progress Notes (Signed)
Chief Complaint  Patient presents with  . Coronary Artery Disease   History of Present Illness: 77 yo male with history of DM, HTN, CAD here today for follow up. He was admitted to [facility] November 2018 with a NSTEMI. Cardiac cath November 2018 with severe stenosis in the mid LAD and mid RCA treated with 3 drug eluting stents in LAD and one drug eluting stent in the mid RCA. The posterolateral branch had a severe stenosis treated with balloon angioplasty. Echo November 2018 with normal LV systolic function. He was placed on ASA and Brilinta but admitted December 2018 with melena and chest pain. EGD showed a non bleeding ulcer and gastritis. ASA was stopped and Brilinta was continued.   He is here today for follow up. The patient denies any chest pain, dyspnea, palpitations, lower extremity edema, orthopnea, PND, dizziness, near syncope or syncope.   Primary Care Physician: Wenda Low, MD  Past Medical History:  Diagnosis Date  . Arthritis    Shoulder, knees, back (01/11/2017)  . CAD in native artery    a. CAD/NSTEMI s/p stenting of LAD then staged stenting to RCA and PTCA of PL branch 12/2016, EF 60-65%.  . Chronic lower back pain   . CKD (chronic kidney disease), stage II    Stage II, per Dr. 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 Complication of anesthesia    "Too much with shoulder surgery", pt. reports that he was told the at they "lost him, due to absorbing too much anesthesia". Not enough for  Right ,  Left knee  just right.  . Constipation    occ  . Dysrhythmia    has been irregular at times since age 64.  Marland Kitchen History of kidney stones   . Hyperlipidemia   . Hypertension   . 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 Myocardial infarction ([place]) 12/2016 X 2  . RBBB   . Type II diabetes mellitus ([place])     Past Surgical History:  Procedure Laterality Date  . ANAL FISSURE REPAIR  X 2  . BACK SURGERY    . CATARACT EXTRACTION W/ INTRAOCULAR LENS  IMPLANT, BILATERAL  Bilateral   . COLONOSCOPY WITH PROPOFOL N/A 06/05/2016   Procedure: COLONOSCOPY WITH PROPOFOL;  Surgeon: Garlan Fair, MD;  [facility];  Service: Endoscopy;  Laterality: N/A;  . CORONARY ANGIOGRAPHY N/A 12/26/2016   Procedure: CORONARY ANGIOGRAPHY;  Surgeon: Troy Sine, MD;  Location: [place] [facility];  Service: Cardiovascular;  Laterality: N/A;  . CORONARY ANGIOPLASTY WITH STENT PLACEMENT     "I've got a total of 4 stents" (01/11/2017)  . CORONARY STENT INTERVENTION N/A 12/25/2016   Procedure: CORONARY STENT INTERVENTION;  Surgeon: Burnell Blanks, MD;  Location: [place] [facility];  Service: Cardiovascular;  Laterality: N/A;  . CORONARY STENT INTERVENTION N/A 12/26/2016   Procedure: CORONARY STENT INTERVENTION;  Surgeon: Troy Sine, MD;  Location: [place] [facility];  Service: Cardiovascular;  Laterality: N/A;  . ESOPHAGOGASTRODUODENOSCOPY (EGD) WITH PROPOFOL N/A 01/12/2017   Procedure: ESOPHAGOGASTRODUODENOSCOPY (EGD) WITH PROPOFOL;  Surgeon: Wilford Corner, MD;  Location: [place];  Service: Endoscopy;  Laterality: N/A;  . HAND LIGAMENT RECONSTRUCTION Right X 2  . KNEE ARTHROSCOPY Bilateral 2009-2010   "both at [facility]  . LEFT HEART CATH AND CORONARY ANGIOGRAPHY N/A 12/25/2016   Procedure: LEFT HEART CATH AND CORONARY ANGIOGRAPHY;  Surgeon: Burnell Blanks, MD;  Location: [place] [facility];  Service: Cardiovascular;  Laterality: N/A;  . LITHOTRIPSY    . LUMBAR LAMINECTOMY/DECOMPRESSION MICRODISCECTOMY N/A 06/09/2015   Procedure: Laminectomy - T12-L1;  Surgeon: Eustace Moore, MD;  Location: [place] [facility];  Service: Neurosurgery;  Laterality: N/A;  Laminectomy - T12-L1  . MEDIAL PARTIAL KNEE REPLACEMENT Bilateral 2009-2010    [facility]   . MELANOMA EXCISION Right    "side of my nose"  . POSTERIOR LUMBAR FUSION  02/02/2012  . SHOULDER ARTHROSCOPY Left 1985  . TOTAL SHOULDER ARTHROPLASTY Left 12/06/2012   Procedure: LEFT TOTAL SHOULDER  ARTHROPLASTY VERSES A REVERSE TOTAL SHOULDER ARTHROPLASTY;  Surgeon: Augustin Schooling, MD;  Location: [place];  Service: Orthopedics;  Laterality: Left;    Current Outpatient Medications  Medication Sig Dispense Refill  . acetaminophen (TYLENOL) 325 MG tablet Take 2 tablets (650 mg total) by mouth every 6 (six) hours as needed for mild pain or headache.    Marland Kitchen amLODipine (NORVASC) 5 MG tablet Take 5 mg by mouth daily.    Marland Kitchen atorvastatin (LIPITOR) 80 MG tablet Take 1 tablet (80 mg total) by mouth daily at 6 PM. 30 tablet 3  . HUMALOG MIX 75/25 KWIKPEN (75-25) 100 UNIT/ML Kwikpen 20 UNITS TWO TIMES A DAY WITH A MEAL SUBCUTANEOUS  3  . isosorbide mononitrate (IMDUR) 30 MG 24 hr tablet Take 1 tablet (30 mg total) by mouth daily. 30 tablet 5  . nitroGLYCERIN (NITROSTAT) 0.4 MG SL tablet Place 1 tablet (0.4 mg total) under the tongue every 5 (five) minutes as needed for chest pain. 30 tablet 12  . Omega-3 Fatty Acids (FISH OIL) 1000 MG CAPS Take 1,000 mg by mouth daily.    . pantoprazole (PROTONIX) 40 MG tablet Take 40 mg by mouth 2 (two) times daily.  1  . ticagrelor (BRILINTA) 90 MG TABS tablet Take 1 tablet (90 mg total) by mouth 2 (two) times daily. 180 tablet 2  . metoprolol tartrate (LOPRESSOR) 25 MG tablet Take 1 tablet (25 mg total) by mouth 2 (two) times daily. 180 tablet 3   No current facility-administered medications for this visit.     Allergies  Allergen Reactions  . Oxycodone Itching  . Codeine Itching  . Morphine And Related Itching    Social History   Socioeconomic History  . Marital status: Married    Spouse name: Not on file  . Number of children: Not on file  . Years of education: Not on file  . Highest education level: Not on file  Occupational History  . Not on file  Social Needs  . Financial resource strain: Not on file  . Food insecurity:    Worry: Not on file    Inability: Not on file  . Transportation needs:    Medical: Not on file    Non-medical: Not on file    Tobacco Use  . Smoking status: Never Smoker  . Smokeless tobacco: Never Used  Substance and Sexual Activity  . Alcohol use: No  . Drug use: No  . Sexual activity: Yes  Lifestyle  . Physical activity:    Days per week: Not on file    Minutes per session: Not on file  . Stress: Not on file  Relationships  . Social connections:    Talks on phone: Not on file    Gets together: Not on file    Attends religious service: Not on file    Active member of club or organization: Not on file    Attends meetings of clubs  or organizations: Not on file    Relationship status: Not on file  . Intimate partner violence:    Fear of current or ex partner: Not on file    Emotionally abused: Not on file    Physically abused: Not on file    Forced sexual activity: Not on file  Other Topics Concern  . Not on file  Social History Narrative  . Not on file    Family History  Problem Relation Age of Onset  . CAD Father   . CAD Sister     Review of Systems:  As stated in the HPI and otherwise negative.   BP 132/70   Pulse (!) 56   Ht 5\' 9"  (1.753 m)   Wt 188 lb 3.2 oz (85.4 kg)   SpO2 98%   BMI 27.79 kg/m   Physical Examination: General: Well developed, well nourished, NAD  HEENT: OP clear, mucus membranes moist  SKIN: warm, dry. No rashes. Neuro: No focal deficits  Musculoskeletal: Muscle strength 5/5 all ext  Psychiatric: Mood and affect normal  Neck: No JVD, no carotid bruits, no thyromegaly, no lymphadenopathy.  Lungs:Clear bilaterally, no wheezes, rhonci, crackles Cardiovascular: Regular rate and rhythm. No murmurs, gallops or rubs. Abdomen:Soft. Bowel sounds present. Non-tender.  Extremities: No lower extremity edema. Pulses are 2 + in the bilateral DP/PT.  Echo 12/26/16: - Left ventricle: The cavity size was normal. Wall thickness was   normal. Systolic function was normal. The estimated ejection   fraction was in the range of 60% to 65%. Wall motion was normal;   there  were no regional wall motion abnormalities. Left   ventricular diastolic function parameters were normal. - Mitral valve: Calcified annulus. - Atrial septum: No defect or patent foramen ovale was identified. - Pulmonary arteries: PA peak pressure: 36 mm Hg (S).  EKG:  EKG is not ordered today. The ekg ordered today demonstrates   Recent Labs: 01/12/2017: ALT 18 02/01/2017: BUN 14; Creatinine, Ser 1.23; Hemoglobin 10.1; Platelets 261; Potassium 4.2; Sodium 143   Lipid Panel    Component Value Date/Time   CHOL 163 12/24/2016 0634   TRIG 63 12/24/2016 0634   HDL 37 (L) 12/24/2016 0634   CHOLHDL 4.4 12/24/2016 0634   VLDL 13 12/24/2016 0634   LDLCALC 113 (H) 12/24/2016 0634     Wt Readings from Last 3 Encounters:  05/24/17 188 lb 3.2 oz (85.4 kg)  02/01/17 189 lb (85.7 kg)  01/13/17 173 lb (78.5 kg)     Other studies Reviewed: Additional studies/ records that were reviewed today include: . Review of the above records demonstrates:    Assessment and Plan:   1. CAD without angina: He is having no chest pain. Drug eluting stents placed in the LAD and RCA in November 2018. Echo with normal LV function. Will continue Brilinta, statin, beta blocker and Imdur. ASA held due to GI bleeding.   2. Anemia/Upper GI bleeding: No recent bleeding on Brilinta and PPI.   3. Hyperlipidemia: Will continue statin and repeat lipids and LFTS now.    Current medicines are reviewed at length with the patient today.  The patient does not have concerns regarding medicines.  The following changes have been made:  no change  Labs/ tests ordered today include:   Orders Placed This Encounter  Procedures  . Lipid Profile  . Hepatic function panel     Disposition:   FU with me in 6 months   Signed, Lauree Chandler, MD 05/24/2017 12:53 PM  [facility] [address], [place], [place]  [address] Phone: [phone]; Fax: [phone]

## 2017-05-24 NOTE — Patient Instructions (Signed)
Medication Instructions:  Your physician recommends that you continue on your current medications as directed. Please refer to the Current Medication list given to you today.   Labwork: Your physician recommends that you return for lab work on April 23,2019.  The lab opens at 7:30 AM.  This will be fasting.  --Lipid and Liver profiles   Testing/Procedures: none  Follow-Up: Your physician recommends that you schedule a follow-up appointment in: 6 months. Please call our office in about 3 months to schedule this appointment    Any Other Special Instructions Will Be Listed Below (If Applicable).     If you need a refill on your cardiac medications before your next appointment, please call your pharmacy.

## 2017-05-29 ENCOUNTER — Other Ambulatory Visit: Payer: Medicare Other | Admitting: *Deleted

## 2017-05-29 DIAGNOSIS — E785 Hyperlipidemia, unspecified: Secondary | ICD-10-CM

## 2017-05-29 LAB — HEPATIC FUNCTION PANEL
ALT: 32 IU/L (ref 0–44)
AST: 33 IU/L (ref 0–40)
Albumin: 4.2 g/dL (ref 3.5–4.8)
Alkaline Phosphatase: 183 IU/L — ABNORMAL HIGH (ref 39–117)
Bilirubin Total: 0.5 mg/dL (ref 0.0–1.2)
Bilirubin, Direct: 0.22 mg/dL (ref 0.00–0.40)
Total Protein: 6.9 g/dL (ref 6.0–8.5)

## 2017-05-29 LAB — LIPID PANEL
Chol/HDL Ratio: 2.6 ratio (ref 0.0–5.0)
Cholesterol, Total: 118 mg/dL (ref 100–199)
HDL: 45 mg/dL (ref >39)
LDL Calculated: 52 mg/dL (ref 0–99)
Triglycerides: 107 mg/dL (ref 0–149)
VLDL Cholesterol Cal: 21 mg/dL (ref 5–40)

## 2017-05-30 ENCOUNTER — Telehealth: Payer: Self-pay | Admitting: Cardiovascular Disease

## 2017-05-30 DIAGNOSIS — R748 Abnormal levels of other serum enzymes: Secondary | ICD-10-CM

## 2017-05-30 DIAGNOSIS — E785 Hyperlipidemia, unspecified: Secondary | ICD-10-CM

## 2017-05-30 NOTE — Telephone Encounter (Signed)
Pt returning your call

## 2017-05-30 NOTE — Telephone Encounter (Signed)
Notes recorded by Burnell Blanks, MD on 05/30/2017 at 8:24 AM EDT Lipids are ok. AST and ALT are ok but alk phos is slightly elevated. I cannot explain this. I would suggest repeat LFTs in 4 weeks. cdm  I spoke with pt and reviewed lab results with him. He will come to office for LFTs on 06/27/17

## 2017-05-30 NOTE — Addendum Note (Signed)
Addended by: Thompson Grayer on: 05/30/2017 02:24 PM   Modules accepted: Orders

## 2017-05-30 NOTE — Telephone Encounter (Signed)
Left message to call back  

## 2017-05-30 NOTE — Telephone Encounter (Signed)
New Message: ° ° ° ° °Pt is returning call for lab results °

## 2017-06-18 ENCOUNTER — Other Ambulatory Visit: Payer: Self-pay | Admitting: Gastroenterology

## 2017-06-18 DIAGNOSIS — R109 Unspecified abdominal pain: Secondary | ICD-10-CM

## 2017-06-26 ENCOUNTER — Other Ambulatory Visit: Payer: Medicare Other

## 2017-06-26 DIAGNOSIS — E785 Hyperlipidemia, unspecified: Secondary | ICD-10-CM

## 2017-06-26 DIAGNOSIS — R748 Abnormal levels of other serum enzymes: Secondary | ICD-10-CM

## 2017-06-26 LAB — HEPATIC FUNCTION PANEL
ALT: 21 IU/L (ref 0–44)
AST: 19 IU/L (ref 0–40)
Albumin: 4.2 g/dL (ref 3.5–4.8)
Alkaline Phosphatase: 128 IU/L — ABNORMAL HIGH (ref 39–117)
Bilirubin Total: 0.6 mg/dL (ref 0.0–1.2)
Bilirubin, Direct: 0.19 mg/dL (ref 0.00–0.40)
Total Protein: 6.7 g/dL (ref 6.0–8.5)

## 2017-06-27 ENCOUNTER — Other Ambulatory Visit: Payer: Medicare Other

## 2017-06-28 ENCOUNTER — Ambulatory Visit
Admission: RE | Admit: 2017-06-28 | Discharge: 2017-06-28 | Disposition: A | Discharge status: Home / Self Care | Payer: Medicare Other | Source: Ambulatory Visit | Attending: Gastroenterology | Admitting: Gastroenterology

## 2017-06-28 DIAGNOSIS — R109 Unspecified abdominal pain: Secondary | ICD-10-CM

## 2017-07-11 ENCOUNTER — Other Ambulatory Visit: Payer: Self-pay | Admitting: Cardiology

## 2017-08-25 ENCOUNTER — Other Ambulatory Visit: Payer: Self-pay | Admitting: Cardiovascular Disease

## 2017-08-27 ENCOUNTER — Other Ambulatory Visit: Payer: Self-pay

## 2017-08-27 MED ORDER — TICAGRELOR 90 MG PO TABS: 90.0000 mg | ORAL_TABLET | Freq: Two times a day (BID) | ORAL | 0 refills | Status: DC

## 2017-10-31 ENCOUNTER — Encounter: Payer: Self-pay | Admitting: Cardiovascular Disease

## 2017-10-31 ENCOUNTER — Encounter

## 2017-10-31 ENCOUNTER — Ambulatory Visit: Payer: Medicare Other | Admitting: Cardiovascular Disease

## 2017-10-31 VITALS — BP 120/72 | HR 68 | Ht 69.0 in | Wt 190.0 lb

## 2017-10-31 DIAGNOSIS — E785 Hyperlipidemia, unspecified: Secondary | ICD-10-CM

## 2017-10-31 DIAGNOSIS — I2510 Atherosclerotic heart disease of native coronary artery without angina pectoris: Secondary | ICD-10-CM

## 2017-10-31 LAB — LIPID PANEL
Chol/HDL Ratio: 5.3 ratio — ABNORMAL HIGH (ref 0.0–5.0)
Cholesterol, Total: 160 mg/dL (ref 100–199)
HDL: 30 mg/dL — ABNORMAL LOW (ref >39)
LDL Calculated: 90 mg/dL (ref 0–99)
Triglycerides: 202 mg/dL — ABNORMAL HIGH (ref 0–149)
VLDL Cholesterol Cal: 40 mg/dL (ref 5–40)

## 2017-10-31 MED ORDER — CLOPIDOGREL BISULFATE 75 MG PO TABS: 75.0000 mg | ORAL_TABLET | Freq: Every day | ORAL | 3 refills | Status: DC

## 2017-10-31 NOTE — Patient Instructions (Addendum)
Medication Instructions:   Your physician has recommended you make the following change in your medication:  Stop Brilinta. Start Clopidogrel (plavix) 75 mg by mouth daily.    Labwork: Lab work to be done today--Lipid profile  Testing/Procedures: none  Follow-Up: Your physician recommends that you schedule a follow-up appointment in: 6 months. Please call our office next week to schedule this appointment    Any Other Special Instructions Will Be Listed Below (If Applicable).     If you need a refill on your cardiac medications before your next appointment, please call your pharmacy.

## 2017-10-31 NOTE — Progress Notes (Signed)
Chief Complaint  Patient presents with  . Follow-up    CAD   History of Present Illness: 77 yo male with history of DM, HTN and CAD here today for follow up. He was admitted to [facility] November 2018 with a NSTEMI. Cardiac cath November 2018 with severe stenosis in the mid LAD and mid RCA treated with 3 drug eluting stents in LAD and one drug eluting stent in the mid RCA. The posterolateral branch had a severe stenosis treated with balloon angioplasty. Echo November 2018 with normal LV systolic function. He was placed on ASA and Brilinta but admitted December 2018 with melena and chest pain. EGD showed a non bleeding ulcer and gastritis. ASA was stopped and Brilinta was continued. He had severe abdominal pains while on a statin and stopped this.   He is here today for follow up. The patient denies any chest pain, dyspnea, palpitations, lower extremity edema, orthopnea, PND, dizziness, near syncope or syncope.    Primary Care Physician: Wenda Low, MD  Past Medical History:  Diagnosis Date  . Arthritis    Shoulder, knees, back (01/11/2017)  . CAD in native artery    a. CAD/NSTEMI s/p stenting of LAD then staged stenting to RCA and PTCA of PL branch 12/2016, EF 60-65%.  . Chronic lower back pain   . CKD (chronic kidney disease), stage II    Stage II, per Dr. 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 Complication of anesthesia    "Too much with shoulder surgery", pt. reports that he was told the at they "lost him, due to absorbing too much anesthesia". Not enough for  Right ,  Left knee  just right.  . Constipation    occ  . Dysrhythmia    has been irregular at times since age 29.  Marland Kitchen History of kidney stones   . Hyperlipidemia   . Hypertension   . 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 RBBB   . Type II diabetes mellitus ([place])     Past Surgical History:  Procedure Laterality Date  . ANAL FISSURE REPAIR  X 2  . BACK SURGERY      . CATARACT EXTRACTION W/ INTRAOCULAR LENS  IMPLANT, BILATERAL Bilateral   . COLONOSCOPY WITH PROPOFOL N/A 06/05/2016   Procedure: COLONOSCOPY WITH PROPOFOL;  Surgeon: Garlan Fair, MD;  Location: [facility];  Service: Endoscopy;  Laterality: N/A;  . CORONARY ANGIOGRAPHY N/A 12/26/2016   Procedure: CORONARY ANGIOGRAPHY;  Surgeon: Troy Sine, MD;  Location: [place] [facility];  Service: Cardiovascular;  Laterality: N/A;  . CORONARY ANGIOPLASTY WITH STENT PLACEMENT     "I've got a total of 4 stents" (01/11/2017)  . CORONARY STENT INTERVENTION N/A 12/25/2016   Procedure: CORONARY STENT INTERVENTION;  Surgeon: Burnell Blanks, MD;  Location: [place] [facility];  Service: Cardiovascular;  Laterality: N/A;  . CORONARY STENT INTERVENTION N/A 12/26/2016   Procedure: CORONARY STENT INTERVENTION;  Surgeon: Troy Sine, MD;  Location: [place] [facility];  Service: Cardiovascular;  Laterality: N/A;  . ESOPHAGOGASTRODUODENOSCOPY (EGD) WITH PROPOFOL N/A 01/12/2017   Procedure: ESOPHAGOGASTRODUODENOSCOPY (EGD) WITH PROPOFOL;  Surgeon: Wilford Corner, MD;  Location: [place];  Service: Endoscopy;  Laterality: N/A;  . HAND LIGAMENT RECONSTRUCTION Right X 2  . KNEE ARTHROSCOPY Bilateral 2009-2010   "both at [facility]  . LEFT HEART CATH AND CORONARY ANGIOGRAPHY N/A 12/25/2016   Procedure: LEFT HEART  CATH AND CORONARY ANGIOGRAPHY;  Surgeon: Burnell Blanks, MD;  Location: [place] [facility];  Service: Cardiovascular;  Laterality: N/A;  . LITHOTRIPSY    . LUMBAR LAMINECTOMY/DECOMPRESSION MICRODISCECTOMY N/A 06/09/2015   Procedure: Laminectomy - T12-L1;  Surgeon: Eustace Moore, MD;  Location: [place] [facility];  Service: Neurosurgery;  Laterality: N/A;  Laminectomy - T12-L1  . MEDIAL PARTIAL KNEE REPLACEMENT Bilateral 2009-2010    [facility]   . MELANOMA EXCISION Right    "side of my nose"  . POSTERIOR LUMBAR FUSION  02/02/2012  . SHOULDER ARTHROSCOPY Left 1985  . TOTAL SHOULDER  ARTHROPLASTY Left 12/06/2012   Procedure: LEFT TOTAL SHOULDER ARTHROPLASTY VERSES A REVERSE TOTAL SHOULDER ARTHROPLASTY;  Surgeon: Augustin Schooling, MD;  Location: [place];  Service: Orthopedics;  Laterality: Left;    Current Outpatient Medications  Medication Sig Dispense Refill  . acetaminophen (TYLENOL) 325 MG tablet Take 2 tablets (650 mg total) by mouth every 6 (six) hours as needed for mild pain or headache.    Marland Kitchen amLODipine (NORVASC) 5 MG tablet Take 5 mg by mouth daily.    Marland Kitchen HUMALOG MIX 75/25 KWIKPEN (75-25) 100 UNIT/ML Kwikpen 20 UNITS TWO TIMES A DAY WITH A MEAL SUBCUTANEOUS  3  . isosorbide mononitrate (IMDUR) 30 MG 24 hr tablet TAKE 1 TABLET BY MOUTH EVERY DAY 30 tablet 4  . metoprolol tartrate (LOPRESSOR) 25 MG tablet Take 1 tablet (25 mg total) by mouth 2 (two) times daily. 180 tablet 3  . nitroGLYCERIN (NITROSTAT) 0.4 MG SL tablet Place 1 tablet (0.4 mg total) under the tongue every 5 (five) minutes as needed for chest pain. 30 tablet 12  . Omega-3 Fatty Acids (FISH OIL) 1000 MG CAPS Take 1,000 mg by mouth daily.    . pantoprazole (PROTONIX) 40 MG tablet Take 40 mg by mouth 2 (two) times daily.  1  . clopidogrel (PLAVIX) 75 MG tablet Take 1 tablet (75 mg total) by mouth daily. 90 tablet 3   No current facility-administered medications for this visit.     Allergies  Allergen Reactions  . Statins Other (See Comments)    Severe stomach pain.  . Oxycodone Itching  . Codeine Itching  . Morphine And Related Itching    Social History   Socioeconomic History  . Marital status: Married    Spouse name: Not on file  . Number of children: Not on file  . Years of education: Not on file  . Highest education level: Not on file  Occupational History  . Not on file  Social Needs  . Financial resource strain: Not on file  . Food insecurity:    Worry: Not on file    Inability: Not on file  . Transportation needs:    Medical: Not on file    Non-medical: Not on file  Tobacco Use    . Smoking status: Never Smoker  . Smokeless tobacco: Never Used  Substance and Sexual Activity  . Alcohol use: No  . Drug use: No  . Sexual activity: Yes  Lifestyle  . Physical activity:    Days per week: Not on file    Minutes per session: Not on file  . Stress: Not on file  Relationships  . Social connections:    Talks on phone: Not on file    Gets together: Not on file    Attends religious service: Not on file    Active member of club or organization: Not on file    Attends meetings of  clubs or organizations: Not on file    Relationship status: Not on file  . Intimate partner violence:    Fear of current or ex partner: Not on file    Emotionally abused: Not on file    Physically abused: Not on file    Forced sexual activity: Not on file  Other Topics Concern  . Not on file  Social History Narrative  . Not on file    Family History  Problem Relation Age of Onset  . CAD Father   . CAD Sister     Review of Systems:  As stated in the HPI and otherwise negative.   BP 120/72   Pulse 68   Ht 5\' 9"  (1.753 m)   Wt 190 lb (86.2 kg)   SpO2 98%   BMI 28.06 kg/m   Physical Examination:  General: Well developed, well nourished, NAD  HEENT: OP clear, mucus membranes moist  SKIN: warm, dry. No rashes. Neuro: No focal deficits  Musculoskeletal: Muscle strength 5/5 all ext  Psychiatric: Mood and affect normal  Neck: No JVD, no carotid bruits, no thyromegaly, no lymphadenopathy.  Lungs:Clear bilaterally, no wheezes, rhonci, crackles Cardiovascular: Regular rate and rhythm. No murmurs, gallops or rubs. Abdomen:Soft. Bowel sounds present. Non-tender.  Extremities: No lower extremity edema. Pulses are 2 + in the bilateral DP/PT.  Echo 12/26/16: - Left ventricle: The cavity size was normal. Wall thickness was   normal. Systolic function was normal. The estimated ejection   fraction was in the range of 60% to 65%. Wall motion was normal;   there were no regional wall  motion abnormalities. Left   ventricular diastolic function parameters were normal. - Mitral valve: Calcified annulus. - Atrial septum: No defect or patent foramen ovale was identified. - Pulmonary arteries: PA peak pressure: 36 mm Hg (S).  EKG:  EKG is ordered today. The ekg ordered today demonstrates NSR, rate 69bpm. RBBB  Recent Labs: 02/01/2017: BUN 14; Creatinine, Ser 1.23; Hemoglobin 10.1; Platelets 261; Potassium 4.2; Sodium 143 06/26/2017: ALT 21   Lipid Panel    Component Value Date/Time   CHOL 118 05/29/2017 0901   TRIG 107 05/29/2017 0901   HDL 45 05/29/2017 0901   CHOLHDL 2.6 05/29/2017 0901   CHOLHDL 4.4 12/24/2016 0634   VLDL 13 12/24/2016 0634   LDLCALC 52 05/29/2017 0901     Wt Readings from Last 3 Encounters:  10/31/17 190 lb (86.2 kg)  05/24/17 188 lb 3.2 oz (85.4 kg)  02/01/17 189 lb (85.7 kg)     Other studies Reviewed: Additional studies/ records that were reviewed today include: . Review of the above records demonstrates:    Assessment and Plan:   1. CAD without angina: No chest pain. Drug eluting stents placed in the LAD and RCA in November 2018. He has normal LV systolic function. Will change Brilinta to Plavix. He is off of ASA due to GI bleeding. Will continue Imdur and beta blocker.  Statin stopped due to abdominal pain. He has not tolerated several different statins. Last LDL below 70 but that was on the statin (see below).   2. Anemia/Upper GI bleeding: No recurrent GI bleeding  3. Hyperlipidemia: LDL at goal at last check but he is now off of his statin. He has not tolerated several different statins. Goal LDL below 70. Will repeat lipids today and if his LDL is above 70 will try Zetia. If we cannot control his lipids with Zetia, will refer then to lipid clinic for [place]  or [place].   Current medicines are reviewed at length with the patient today.  The patient does not have concerns regarding medicines.  The following changes have been  made:  no change  Labs/ tests ordered today include:   Orders Placed This Encounter  Procedures  . Lipid Profile  . EKG 12-Lead     Disposition:   FU with me in 6 months   Signed, Lauree Chandler, MD 10/31/2017 1:15 PM    [facility] [address], [place], [place]  [address] Phone: [phone]; Fax: [phone]

## 2017-11-01 ENCOUNTER — Other Ambulatory Visit: Payer: Self-pay | Admitting: *Deleted

## 2017-11-01 DIAGNOSIS — E785 Hyperlipidemia, unspecified: Secondary | ICD-10-CM

## 2017-11-01 MED ORDER — EZETIMIBE 10 MG PO TABS: 10.0000 mg | ORAL_TABLET | Freq: Every day | ORAL | 11 refills | Status: DC

## 2017-11-01 NOTE — Progress Notes (Signed)
Notes recorded by Thompson Grayer, RN on 11/01/2017 at 9:50 AM EDT Pt notified. [facility] [place]. He will come in for fasting lipid profile on 01/21/18 ------  Notes recorded by Burnell Blanks, MD on 11/01/2017 at 9:32 AM EDT LDL not at goal of 70. We discussed his statin intolerance yesterday. He will not try a statin again. He agrees to Zetia if needed. Can we start Zetia 10 mg daily and repeat lipids only in 12 weeks. If LDL not at goal then, will need referral to the lipid clinic for [place] or [place]. Thanks, chris

## 2017-11-05 ENCOUNTER — Telehealth: Payer: Self-pay | Admitting: Cardiovascular Disease

## 2017-11-05 NOTE — Telephone Encounter (Signed)
Called pt and left message informing pt that his medication was already at his pharmacy with a year supply and if he has any other problems, questions or concerns to call the office.

## 2017-11-05 NOTE — Telephone Encounter (Signed)
New Message          *STAT* If patient is at the pharmacy, call can be transferred to refill team.   1. Which medications need to be refilled? (please list name of each medication and dose if known) [place]  2. Which pharmacy/location (including street and city if local pharmacy) is medication to be sent to? [facility]  3. Do they need a 30 day or 90 day supply? [place]

## 2017-11-18 ENCOUNTER — Other Ambulatory Visit: Payer: Self-pay | Admitting: Cardiovascular Disease

## 2017-12-10 ENCOUNTER — Telehealth: Payer: Self-pay | Admitting: Cardiovascular Disease

## 2017-12-10 ENCOUNTER — Other Ambulatory Visit: Payer: Self-pay | Admitting: Urology

## 2017-12-10 NOTE — Telephone Encounter (Signed)
New message      [facility] Pre-operative Risk Assessment    Request for surgical clearance:  1. What type of surgery is being performed? Ureteroscopy    2. When is this surgery scheduled? 01/07/2018  3. What type of clearance is required (medical clearance vs. Pharmacy clearance to hold med vs. Both)? pharmacy  4. Are there any medications that need to be held prior to surgery and how long?Plavix will need to be withheld   5. Practice name and name of physician performing surgery? [facility], Dr. Link Snuffer  What is your office phone [phone]  7.   What is your office fax number [phone]  8.   Anesthesia type (None, local, MAC, general) ? general   Maryjane Hurter 12/10/2017, 2:36 PM  _________________________________________________________________   (provider comments below)

## 2017-12-11 NOTE — Telephone Encounter (Signed)
Dr. Angelena Form, this pt had NSTEMI  With 3 drug eluting stents to LAD and one DES to mid RCA 12/27/2016.  Can Plavix be held For ureteroscopy planned for 01/07/18.   Please route response back to [facility] PREOP  Thanks, Gae Bon

## 2017-12-11 NOTE — Telephone Encounter (Signed)
Plavix can be held at this time for the procedure. Thanks, chris

## 2017-12-12 NOTE — Telephone Encounter (Signed)
   Primary Cardiologist: Lauree Chandler, MD  Chart reviewed as part of pre-operative protocol coverage. Patient was contacted 12/12/2017 in reference to pre-operative risk assessment for pending surgery as outlined below.  Matthew [name] was last seen on 10/31/17 by Dr. Angelena Form. H/o DM, HTN, CAD, NSTEMI, GIB. Off ASA due to GIB, now on Plavix. Last PCI 12/2016. RCRI 6.6% for CAD, IDDM.  Called pt to discuss sx - no answer - LMOM to call back. As long as feeling well without new anginal sx, should be OK to proceed.  Per Dr. Angelena Form, "Plavix can be held at this time for the procedure." We ask that this be resumed at the surgeon's discretion as soon as felt safe.  Charlie Pitter, PA-C 12/12/2017, 3:22 PM

## 2017-12-14 NOTE — Telephone Encounter (Signed)
   Primary Cardiologist: Lauree Chandler, MD  Chart reviewed as part of pre-operative protocol coverage. Patient was contacted 12/12/2017 in reference to pre-operative risk assessment for pending surgery as outlined below. I had left him a message and he called back this morning. Matthew [name] was last seen on 10/31/17 by Dr. Angelena Form. H/o DM, HTN, CAD, NSTEMI, GIB. Off ASA due to GIB, now on Plavix. Last PCI 12/2016. RCRI 6.6% for CAD, IDDM. He affirms he is able to wash dishes, mow the yard, walk up steps, and clean the floors without any anginal symptoms. No new CP, SOB, palpitations, syncope. Therefore, based on ACC/AHA guidelines, the patient would be at acceptable risk for the planned procedure without further cardiovascular testing.   Per Dr. Angelena Form, "Plavix can be held at this time for the procedure." The patient indicates he was told to hold 5 days before. Since this was not outlined in the clearance request, will defer to surgery team to review. We ask that this be resumed at the surgeon's discretion as soon as felt safe.  I will route this recommendation to the requesting party via Epic fax function and remove from pre-op pool.  Please call with questions.  Charlie Pitter, PA-C 12/14/2017, 9:23 AM

## 2017-12-23 DIAGNOSIS — I252 Old myocardial infarction: Secondary | ICD-10-CM

## 2017-12-23 HISTORY — DX: Old myocardial infarction: I25.2

## 2017-12-31 ENCOUNTER — Other Ambulatory Visit: Payer: Self-pay

## 2017-12-31 ENCOUNTER — Encounter (HOSPITAL_BASED_OUTPATIENT_CLINIC_OR_DEPARTMENT_OTHER): Payer: Self-pay | Admitting: *Deleted

## 2017-12-31 NOTE — Progress Notes (Signed)
Spoke w/ pt via phone for pre-op interview.  Npo after mn.  Arrive at 0830.  Needs istat.  Current ekg in chart and epic.  Cardiologist lov note dated 10-31-2017 and telephone clearance dated 12-14-2017 in chart and epic. Will take metoprolol, protonix, zetia, norvasc, and imdur am dos w/ sips of water.

## 2018-01-07 ENCOUNTER — Ambulatory Visit (HOSPITAL_BASED_OUTPATIENT_CLINIC_OR_DEPARTMENT_OTHER)
Admission: RE | Admit: 2018-01-07 | Discharge: 2018-01-07 | Disposition: A | Discharge status: Home / Self Care | Payer: Medicare Other | Source: Ambulatory Visit | Attending: Urology | Admitting: Urology

## 2018-01-07 ENCOUNTER — Encounter (HOSPITAL_BASED_OUTPATIENT_CLINIC_OR_DEPARTMENT_OTHER)
Admission: RE | Disposition: A | Discharge status: Home / Self Care | Payer: Self-pay | Source: Ambulatory Visit | Attending: Urology

## 2018-01-07 ENCOUNTER — Encounter (HOSPITAL_BASED_OUTPATIENT_CLINIC_OR_DEPARTMENT_OTHER): Payer: Self-pay

## 2018-01-07 ENCOUNTER — Ambulatory Visit (HOSPITAL_BASED_OUTPATIENT_CLINIC_OR_DEPARTMENT_OTHER): Payer: Medicare Other | Admitting: Certified Registered"

## 2018-01-07 DIAGNOSIS — Z8249 Family history of ischemic heart disease and other diseases of the circulatory system: Secondary | ICD-10-CM | POA: Diagnosis not present

## 2018-01-07 DIAGNOSIS — Z79899 Other long term (current) drug therapy: Secondary | ICD-10-CM | POA: Diagnosis not present

## 2018-01-07 DIAGNOSIS — Z794 Long term (current) use of insulin: Secondary | ICD-10-CM | POA: Insufficient documentation

## 2018-01-07 DIAGNOSIS — Z87442 Personal history of urinary calculi: Secondary | ICD-10-CM | POA: Diagnosis not present

## 2018-01-07 DIAGNOSIS — N132 Hydronephrosis with renal and ureteral calculous obstruction: Secondary | ICD-10-CM | POA: Diagnosis present

## 2018-01-07 DIAGNOSIS — M1990 Unspecified osteoarthritis, unspecified site: Secondary | ICD-10-CM | POA: Insufficient documentation

## 2018-01-07 DIAGNOSIS — Z7902 Long term (current) use of antithrombotics/antiplatelets: Secondary | ICD-10-CM | POA: Insufficient documentation

## 2018-01-07 DIAGNOSIS — Z833 Family history of diabetes mellitus: Secondary | ICD-10-CM | POA: Insufficient documentation

## 2018-01-07 DIAGNOSIS — Z841 Family history of disorders of kidney and ureter: Secondary | ICD-10-CM | POA: Diagnosis not present

## 2018-01-07 DIAGNOSIS — Z885 Allergy status to narcotic agent status: Secondary | ICD-10-CM | POA: Diagnosis not present

## 2018-01-07 DIAGNOSIS — Z955 Presence of coronary angioplasty implant and graft: Secondary | ICD-10-CM | POA: Diagnosis not present

## 2018-01-07 HISTORY — DX: Long term (current) use of anticoagulants: Z79.01

## 2018-01-07 HISTORY — DX: Old myocardial infarction: I25.2

## 2018-01-07 HISTORY — DX: Gastro-esophageal reflux disease without esophagitis: K21.9

## 2018-01-07 HISTORY — DX: Type 2 diabetes mellitus without complications: E11.9

## 2018-01-07 HISTORY — DX: Other intervertebral disc degeneration, lumbosacral region without mention of lumbar back pain or lower extremity pain: M51.379

## 2018-01-07 HISTORY — DX: Iron deficiency anemia, unspecified: D50.9

## 2018-01-07 HISTORY — DX: Chronic kidney disease, stage 3 unspecified: N18.30

## 2018-01-07 HISTORY — DX: Calculus of kidney: N20.0

## 2018-01-07 HISTORY — PX: CYSTOSCOPY/URETEROSCOPY/HOLMIUM LASER/STENT PLACEMENT: SHX6546

## 2018-01-07 HISTORY — DX: Long term (current) use of insulin: Z79.4

## 2018-01-07 HISTORY — DX: Personal history of other diseases of the digestive system: Z87.19

## 2018-01-07 HISTORY — DX: Nocturia: R35.1

## 2018-01-07 HISTORY — DX: Presence of coronary angioplasty implant and graft: Z95.5

## 2018-01-07 HISTORY — DX: Personal history of malignant melanoma of skin: Z85.820

## 2018-01-07 HISTORY — DX: Other intervertebral disc degeneration, lumbosacral region: M51.37

## 2018-01-07 HISTORY — DX: Chronic kidney disease, stage 3 (moderate): N18.3

## 2018-01-07 LAB — POCT I-STAT 4, (NA,K, GLUC, HGB,HCT)
Glucose, Bld: 125 mg/dL — ABNORMAL HIGH (ref 70–99)
HCT: 20 % — ABNORMAL LOW (ref 39.0–52.0)
Hemoglobin: 6.8 g/dL — CL (ref 13.0–17.0)
Potassium: 4.2 mmol/L (ref 3.5–5.1)
Sodium: 141 mmol/L (ref 135–145)

## 2018-01-07 LAB — POCT HEMOGLOBIN-HEMACUE: Hemoglobin: 8.2 g/dL — ABNORMAL LOW (ref 13.0–17.0)

## 2018-01-07 LAB — GLUCOSE, CAPILLARY: Glucose-Capillary: 113 mg/dL — ABNORMAL HIGH (ref 70–99)

## 2018-01-07 SURGERY — CYSTOSCOPY/URETEROSCOPY/HOLMIUM LASER/STENT PLACEMENT
Anesthesia: General | Site: Ureter | Laterality: Right

## 2018-01-07 MED ORDER — ONDANSETRON HCL 4 MG/2ML IJ SOLN
INTRAMUSCULAR | Status: AC
  Filled 2018-01-07: qty 2

## 2018-01-07 MED ORDER — SODIUM CHLORIDE 0.9 % IV SOLN
INTRAVENOUS | Status: DC
  Administered 2018-01-07: 10:00:00 via INTRAVENOUS
  Filled 2018-01-07: qty 1000

## 2018-01-07 MED ORDER — FENTANYL CITRATE (PF) 100 MCG/2ML IJ SOLN
INTRAMUSCULAR | Status: AC
  Filled 2018-01-07: qty 2

## 2018-01-07 MED ORDER — CEFAZOLIN SODIUM-DEXTROSE 2-4 GM/100ML-% IV SOLN
INTRAVENOUS | Status: AC
  Filled 2018-01-07: qty 100

## 2018-01-07 MED ORDER — TRAMADOL HCL 50 MG PO TABS
50.0000 mg | ORAL_TABLET | Freq: Four times a day (QID) | ORAL | Status: DC | PRN
  Administered 2018-01-07: 50 mg via ORAL
  Filled 2018-01-07: qty 1

## 2018-01-07 MED ORDER — LIDOCAINE 2% (20 MG/ML) 5 ML SYRINGE
INTRAMUSCULAR | Status: DC | PRN
  Administered 2018-01-07: 100 mg via INTRAVENOUS

## 2018-01-07 MED ORDER — PROPOFOL 10 MG/ML IV BOLUS
INTRAVENOUS | Status: AC
  Filled 2018-01-07: qty 20

## 2018-01-07 MED ORDER — HYDROMORPHONE HCL 1 MG/ML IJ SOLN
INTRAMUSCULAR | Status: AC
  Filled 2018-01-07: qty 1

## 2018-01-07 MED ORDER — HYDROMORPHONE HCL 1 MG/ML IJ SOLN
0.2500 mg | INTRAMUSCULAR | Status: DC | PRN
  Administered 2018-01-07: 0.5 mg via INTRAVENOUS
  Administered 2018-01-07 (×2): 0.25 mg via INTRAVENOUS
  Filled 2018-01-07: qty 0.5

## 2018-01-07 MED ORDER — IOHEXOL 300 MG/ML  SOLN
INTRAMUSCULAR | Status: DC | PRN
  Administered 2018-01-07: 10 mL via URETHRAL

## 2018-01-07 MED ORDER — PROPOFOL 10 MG/ML IV BOLUS
INTRAVENOUS | Status: DC | PRN
  Administered 2018-01-07: 150 mg via INTRAVENOUS
  Administered 2018-01-07: 50 mg via INTRAVENOUS

## 2018-01-07 MED ORDER — TRAMADOL HCL 50 MG PO TABS
ORAL_TABLET | ORAL | Status: AC
  Filled 2018-01-07: qty 1

## 2018-01-07 MED ORDER — ONDANSETRON HCL 4 MG/2ML IJ SOLN
INTRAMUSCULAR | Status: DC | PRN
  Administered 2018-01-07: 4 mg via INTRAVENOUS

## 2018-01-07 MED ORDER — CEFAZOLIN SODIUM-DEXTROSE 2-4 GM/100ML-% IV SOLN
2.0000 g | INTRAVENOUS | Status: AC
  Administered 2018-01-07: 2 g via INTRAVENOUS
  Filled 2018-01-07: qty 100

## 2018-01-07 MED ORDER — FENTANYL CITRATE (PF) 100 MCG/2ML IJ SOLN
INTRAMUSCULAR | Status: DC | PRN
  Administered 2018-01-07 (×2): 25 ug via INTRAVENOUS
  Administered 2018-01-07: 50 ug via INTRAVENOUS

## 2018-01-07 MED ORDER — ONDANSETRON HCL 4 MG/2ML IJ SOLN
4.0000 mg | Freq: Once | INTRAMUSCULAR | Status: DC | PRN
  Filled 2018-01-07: qty 2

## 2018-01-07 MED ORDER — HYDROCODONE-ACETAMINOPHEN 5-325 MG PO TABS: 1.0000 | ORAL_TABLET | ORAL | 0 refills | Status: DC | PRN

## 2018-01-07 MED ORDER — LIDOCAINE 2% (20 MG/ML) 5 ML SYRINGE
INTRAMUSCULAR | Status: AC
  Filled 2018-01-07: qty 5

## 2018-01-07 MED ORDER — SODIUM CHLORIDE 0.9 % IR SOLN
Status: DC | PRN
  Administered 2018-01-07: 3000 mL via INTRAVESICAL

## 2018-01-07 MED ORDER — MIDAZOLAM HCL 2 MG/2ML IJ SOLN
INTRAMUSCULAR | Status: AC
  Filled 2018-01-07: qty 2

## 2018-01-07 MED ORDER — MEPERIDINE HCL 25 MG/ML IJ SOLN
6.2500 mg | INTRAMUSCULAR | Status: DC | PRN
  Filled 2018-01-07: qty 1

## 2018-01-07 SURGICAL SUPPLY — 25 items
BAG DRAIN URO-CYSTO SKYTR STRL (DRAIN) ×2 IMPLANT
CATH INTERMIT  6FR 70CM (CATHETERS) IMPLANT
CLOTH BEACON ORANGE TIMEOUT ST (SAFETY) ×2 IMPLANT
FIBER LASER FLEXIVA 365 (UROLOGICAL SUPPLIES) IMPLANT
FIBER LASER TRAC TIP (UROLOGICAL SUPPLIES) ×2 IMPLANT
GLOVE BIO SURGEON STRL SZ7.5 (GLOVE) ×2 IMPLANT
GLOVE BIOGEL PI IND STRL 6.5 (GLOVE) ×1 IMPLANT
GLOVE BIOGEL PI INDICATOR 6.5 (GLOVE) ×1
GLOVE SURG SS PI 6.5 STRL IVOR (GLOVE) ×2 IMPLANT
GOWN STRL REUS W/ TWL LRG LVL3 (GOWN DISPOSABLE) ×1 IMPLANT
GOWN STRL REUS W/TWL LRG LVL3 (GOWN DISPOSABLE) ×1
GOWN STRL REUS W/TWL XL LVL3 (GOWN DISPOSABLE) IMPLANT
GUIDEWIRE STR DUAL SENSOR (WIRE) ×4 IMPLANT
INFUSOR MANOMETER BAG 3000ML (MISCELLANEOUS) IMPLANT
IV NS 1000ML (IV SOLUTION)
IV NS 1000ML BAXH (IV SOLUTION) IMPLANT
IV NS IRRIG 3000ML ARTHROMATIC (IV SOLUTION) ×2 IMPLANT
KIT TURNOVER CYSTO (KITS) ×2 IMPLANT
MANIFOLD NEPTUNE II (INSTRUMENTS) ×2 IMPLANT
NS IRRIG 500ML POUR BTL (IV SOLUTION) ×4 IMPLANT
PACK CYSTO (CUSTOM PROCEDURE TRAY) ×2 IMPLANT
SHEATH URETERAL 12FRX35CM (MISCELLANEOUS) ×2 IMPLANT
STENT URET 6FRX26 CONTOUR (STENTS) ×2 IMPLANT
TUBE CONNECTING 12X1/4 (SUCTIONS) IMPLANT
TUBING UROLOGY SET (TUBING) ×2 IMPLANT

## 2018-01-07 NOTE — H&P (Signed)
CC/HPI: Cc: Right renal pelvis stone  HPI:  77 year old male who was sent over for microscopic hematuria. He has a history of nephrolithiasis. He underwent a CT of the abdomen and pelvis without contrast that revealed multiple punctate left renal calculi as well as a 9 mm calculus in the right renal collecting system near the ureteropelvic junction. He denies any right-sided flank pain. He denies gross hematuria or dysuria. He has 4 cardiac stents and is on Plavix. Last stent was placed this past November.     ALLERGIES: Codeine Sulfate TABS Morphine Derivatives OxyCODONE HCl CAPS    MEDICATIONS: Metoprolol Succinate  Amlodipine Besylate 5 mg tablet Oral  Brilinta  Fish Oil  Humalog Mix 75-25 100 unit/ml (75-25) vial Subcutaneous  Isosorbide  Nitroglycerin  Pantoprazole Sodium  Pravastatin Sodium  Tizanidine Hcl  Tramadol Hcl  Tylenol     GU PSH: Cystoscopy Insert Stent - 2011 ESWL - 2011      PSH Notes: Cystoscopy With Insertion Of Ureteral Stent Right, Lithotripsy, Rotator Cuff Repair, Arthroscopy Knee   NON-GU PSH: Knee Arthroscopy; Dx - 2008    GU PMH: Microscopic hematuria - 11/05/2017 Abdominal Pain Unspec, Bilateral flank pain - 2017 Renal calculus, Nephrolithiasis - 2017 BPH w/LUTS, Benign prostatic hyperplasia with urinary obstruction - 2014 Hematuria, Unspec, Hematuria - 2014 Hydronephrosis Unspec, Hydronephrosis - 2014 Low back pain, Lower back pain - 2014 Ureteral calculus, Calculus of ureter - 2014      PMH Notes:  2006-12-20 12:40:40 - Note: Arthritis   NON-GU PMH: Encounter for general adult medical examination without abnormal findings, Encounter for preventive health examination - 2017 Personal history of other diseases of the circulatory system, History of hypertension - 2014 Personal history of other endocrine, nutritional and metabolic disease, History of hypercholesterolemia - 2014, History of diabetes mellitus, - 2014    FAMILY HISTORY: Acute  Myocardial Infarction - Father Death In The Family Father - Father Diabetes - Runs In Family heart failure - Runs In Family Hypertension - Runs In Family nephrolithiasis - Sister   SOCIAL HISTORY: Marital Status: Married     Notes: Alcohol use, Married, Number of children, Never a smoker, Retired, Caffeine use   REVIEW OF SYSTEMS:    GU Review Male:   Patient denies frequent urination, hard to postpone urination, burning/ pain with urination, get up at night to urinate, leakage of urine, stream starts and stops, trouble starting your stream, have to strain to urinate , erection problems, and penile pain.  Gastrointestinal (Upper):   Patient denies indigestion/ heartburn, vomiting, and nausea.  Gastrointestinal (Lower):   Patient denies diarrhea and constipation.  Constitutional:   Patient denies fever, night sweats, weight loss, and fatigue.  Skin:   Patient denies skin rash/ lesion and itching.  Eyes:   Patient denies blurred vision and double vision.  Ears/ Nose/ Throat:   Patient denies sore throat and sinus problems.  Hematologic/Lymphatic:   Patient denies swollen glands and easy bruising.  Cardiovascular:   Patient denies leg swelling and chest pains.  Respiratory:   Patient denies cough and shortness of breath.  Endocrine:   Patient denies excessive thirst.  Musculoskeletal:   Patient denies back pain and joint pain.  Neurological:   Patient denies headaches and dizziness.  Psychologic:   Patient denies depression and anxiety.   VITAL SIGNS: None   MULTI-SYSTEM PHYSICAL EXAMINATION:    Constitutional: Well-nourished. No physical deformities. Normally developed. Good grooming.  Respiratory: No labored breathing, no use of accessory muscles.  Cardiovascular: Normal temperature, adequate perfusion of extremities  Skin: No paleness, no jaundice  Neurologic / Psychiatric: Oriented to time, oriented to place, oriented to person. No depression, no anxiety, no agitation.   Gastrointestinal: No mass, no tenderness, no rigidity, non obese abdomen.  Eyes: Normal conjunctivae. Normal eyelids.  Musculoskeletal: Normal gait and station of head and neck.     PAST DATA REVIEWED:  Source Of History:  Patient  Records Review:   Previous Patient Records  X-Ray Review: C.T. Abdomen/Pelvis: Reviewed Films. Reviewed Report. Discussed With Patient.     12/21/06  PSA  Total PSA 0.50     PROCEDURES:          Urinalysis w/Scope Dipstick Dipstick Cont'd Micro  Color: Yellow Bilirubin: Neg mg/dL WBC/hpf: 0 - 5/hpf  Appearance: Clear Ketones: Neg mg/dL RBC/hpf: 3 - 10/hpf  Specific Gravity: 1.025 Blood: 1+ ery/uL Bacteria: NS (Not Seen)  pH: <=5.0 Protein: 1+ mg/dL Cystals: NS (Not Seen)  Glucose: Neg mg/dL Urobilinogen: 0.2 mg/dL Casts: NS (Not Seen)    Nitrites: Neg Trichomonas: Not Present    Leukocyte Esterase: Neg leu/uL Mucous: Present      Epithelial Cells: 0 - 5/hpf      Yeast: NS (Not Seen)      Sperm: Not Present    ASSESSMENT:      ICD-10 Details  1 GU:   Renal calculus - N20.0 Stable  2   Microscopic hematuria - R31.21 Stable   PLAN:           Orders Labs Urine Culture          Document Letter(s):  Created for Patient: Clinical Summary    Signed by Link Snuffer, III, M.D. on 12/06/17 at 1:13 PM (EDT

## 2018-01-07 NOTE — Transfer of Care (Signed)
Immediate Anesthesia Transfer of Care Note  Patient: Matthew [name]  Procedure(s) Performed: RIGHT URETEROSCOPY/HOLMIUM LASER/STENT PLACEMENT (Right Ureter)  Patient Location: PACU  Anesthesia Type:General  Level of Consciousness: awake, oriented, drowsy and patient cooperative  Airway & Oxygen Therapy: Patient Spontanous Breathing and Patient connected to nasal cannula oxygen  Post-op Assessment: Report given to RN and Post -op Vital signs reviewed and stable  Post vital signs: Reviewed and stable  Last Vitals:  Vitals Value Taken Time  BP 184/79 01/07/2018 11:37 AM  Temp    Pulse 70 01/07/2018 11:38 AM  Resp    SpO2 97 % 01/07/2018 11:38 AM  Vitals shown include unvalidated device data.  Last Pain:  Vitals:   01/07/18 0906  TempSrc:   PainSc: 3       Patients Stated Pain Goal: 8 (03/29/77 8102)  Complications: No apparent anesthesia complications

## 2018-01-07 NOTE — Anesthesia Procedure Notes (Signed)
Procedure Name: LMA Insertion Date/Time: 01/07/2018 10:52 AM Performed by: Verlin Grills, CRNA Pre-anesthesia Checklist: Patient identified, Emergency Drugs available, Suction available, Patient being monitored and Timeout performed Patient Re-evaluated:Patient Re-evaluated prior to induction Oxygen Delivery Method: Circle system utilized Preoxygenation: Pre-oxygenation with 100% oxygen Induction Type: IV induction Ventilation: Mask ventilation without difficulty LMA: LMA inserted LMA Size: 4.0 Number of attempts: 1 Dental Injury: Teeth and Oropharynx as per pre-operative assessment

## 2018-01-07 NOTE — Discharge Instructions (Signed)
[facility] °[phone] °Post Ureteroscopy With or Without Stent Instructions ° °Definitions: ° °Ureter: The duct that transports urine from the kidney to the bladder. °Stent:   A plastic hollow tube that is placed into the ureter, from the kidney to the                 bladder to prevent the ureter from swelling shut. ° °GENERAL INSTRUCTIONS: ° °Despite the fact that no skin incisions were used, the area around the ureter and bladder is raw and irritated. The stent is a foreign body which will further irritate the bladder wall. This irritation is manifested by increased frequency of urination, both day and night, and by an increase in the urge to urinate. In some, the urge to urinate is present almost always. Sometimes the urge is strong enough that you may not be able to stop yourself from urinating. The only real cure is to remove the stent and then give time for the bladder wall to heal which can't be done until the danger of the ureter swelling shut has passed, which varies. ° °You may see some blood in your urine while the stent is in place and a few days afterwards. Do not be alarmed, even if the urine was clear for a while. Get off your feet and drink lots of fluids until clearing occurs. If you start to pass clots or don't improve, call us. ° °DIET: °You may return to your normal diet immediately. Because of the raw surface of your bladder, alcohol, spicy foods, acid type foods and drinks with caffeine may cause irritation or frequency and should be used in moderation. To keep your urine flowing freely and to avoid constipation, drink plenty of fluids during the day ( 8-10 glasses ). °Tip: Avoid cranberry juice because it is very acidic. ° °ACTIVITY: °Your physical activity doesn't need to be restricted. However, if you are very active, you may see some blood in your urine. We suggest that you reduce your activity under these circumstances until the bleeding has stopped. ° °BOWELS: °It is  important to keep your bowels regular during the postoperative period. Straining with bowel movements can cause bleeding. A bowel movement every other day is reasonable. Use a mild laxative if needed, such as Milk of Magnesia 2-3 tablespoons, or 2 Dulcolax tablets. Call if you continue to have problems. If you have been taking narcotics for pain, before, during or after your surgery, you may be constipated. Take a laxative if necessary. ° ° °MEDICATION: °You should resume your pre-surgery medications unless told not to. °You may take oxybutynin or flomax if prescribed for bladder spasms or discomfort from the stent °Take pain medication as directed for pain refractory to conservative management ° °PROBLEMS YOU SHOULD REPORT TO US: °· Fevers over 100.5 Fahrenheit. °· Heavy bleeding, or clots ( See above notes about blood in urine ). °· Inability to urinate. °· Drug reactions ( hives, rash, nausea, vomiting, diarrhea ). °· Severe burning or pain with urination that is not improving. ° ° °Post Anesthesia Home Care Instructions ° °Activity: °Get plenty of rest for the remainder of the day. A responsible individual must stay with you for 24 hours following the procedure.  °For the next 24 hours, DO NOT: °-Drive a car °-Operate machinery °-Drink alcoholic beverages °-Take any medication unless instructed by your physician °-Make any legal decisions or sign important papers. ° °Meals: °Start with liquid foods such as gelatin or soup. Progress to regular foods   as tolerated. Avoid greasy, spicy, heavy foods. If nausea and/or vomiting occur, drink only clear liquids until the nausea and/or vomiting subsides. Call your physician if vomiting continues. ° °Special Instructions/Symptoms: °Your throat may feel dry or sore from the anesthesia or the breathing tube placed in your throat during surgery. If this causes discomfort, gargle with warm salt water. The discomfort should disappear within 24 hours. ° °If you had a scopolamine  patch placed behind your ear for the management of post- operative nausea and/or vomiting: ° °1. The medication in the patch is effective for 72 hours, after which it should be removed.  Wrap patch in a tissue and discard in the trash. Wash hands thoroughly with soap and water. °2. You may remove the patch earlier than 72 hours if you experience unpleasant side effects which may include dry mouth, dizziness or visual disturbances. °3. Avoid touching the patch. Wash your hands with soap and water after contact with the patch. °  ° ° °

## 2018-01-07 NOTE — Op Note (Signed)
Operative Note  Preoperative diagnosis:  1.  Right renal calculus  Postoperative diagnosis: 1.  Right renal calculus  Procedure(s): 1.  Cystoscopy with right retrograde pyelogram, right ureteroscopy with laser lithotripsy and ureteral stent placement  Surgeon: Link Snuffer, MD  Assistants: None  Anesthesia: General  Complications: None immediate  EBL: Minimal  Specimens: 1.  None  Drains/Catheters: 1.  6 x 26 double-J ureteral stent  Intraoperative findings: 1.  Normal urethra and bladder 2.  Right retrograde pyelogram revealed mild fullness of the renal pelvis but no obvious filling defects.  9 mm stone was fragmented to tiny fragments  Indication: 77 year old male with microscopic hematuria who was found to have a right renal calculus elected to undergo the above operation.  Description of procedure:  The patient was identified and consent was obtained.  The patient was taken to the operating room and placed in the supine position.  The patient was placed under general anesthesia.  Perioperative antibiotics were administered.  The patient was placed in dorsal lithotomy.  Patient was prepped and draped in a standard sterile fashion and a timeout was performed.  A 21 French rigid cystoscope was advanced into the urethra and into the bladder.  Complete cystoscopy was performed with no abnormal findings.  The right ureter was cannulated with a sensor wire which was advanced to the kidney under fluoroscopic guidance.  A second sensor wire was advanced alongside this up to the kidney.  The scope was withdrawn.  A 12 x 14 ureteral access sheath was advanced over 1 of the wires under continuous fluoroscopic guidance and there was minimal resistance.  I withdrew the inner sheath along with that wire keeping the other wire in place.  I shot a retrograde pyelogram through the sheath with the findings noted above.  Flexible ureteroscopy was then performed identifying the stone of interest  which was laser fragmented to tiny fragments.  There were no other clinically significant stone fragments seen.  I therefore carefully withdrew the scope along with the access sheath visualizing the entire ureter upon removal.  There was no obvious trauma to the ureter and there were no ureteral calculi.  I backloaded the wire onto a rigid cystoscope was advanced into the bladder.  6 x 26 double-J ureteral stent was placed in a standard fashion followed by removal of the wire.  Fluoroscopy confirmed proximal placement and direct visualization confirmed a good coil within the bladder.  The bladder was drained and the scope withdrawn.  Patient tolerated the procedure well was stable postoperatively.  Plan: Return in 1 week for stent removal.

## 2018-01-07 NOTE — Anesthesia Postprocedure Evaluation (Signed)
Anesthesia Post Note  Patient: Matthew [name]  Procedure(s) Performed: RIGHT URETEROSCOPY/HOLMIUM LASER/STENT PLACEMENT (Right Ureter)     Patient location during evaluation: PACU Anesthesia Type: General Level of consciousness: awake and alert Pain management: pain level controlled Vital Signs Assessment: post-procedure vital signs reviewed and stable Respiratory status: spontaneous breathing, nonlabored ventilation, respiratory function stable and patient connected to nasal cannula oxygen Cardiovascular status: blood pressure returned to baseline and stable Postop Assessment: no apparent nausea or vomiting Anesthetic complications: no    Last Vitals:  Vitals:   01/07/18 1200 01/07/18 1209  BP: (!) 169/76   Pulse: 71 67  Resp: 16 11  Temp:    SpO2: 100% 97%    Last Pain:  Vitals:   01/07/18 1209  TempSrc:   PainSc: 8                  Khallid Pasillas DAVID

## 2018-01-07 NOTE — Anesthesia Preprocedure Evaluation (Signed)
Anesthesia Evaluation  Patient identified by MRN, date of birth, ID band Patient awake    Reviewed: Allergy & Precautions, NPO status   Airway Mallampati: I  TM Distance: >3 FB Neck ROM: Full    Dental   Pulmonary    Pulmonary exam normal        Cardiovascular hypertension, Pt. on medications + CAD and + Cardiac Stents  Normal cardiovascular exam     Neuro/Psych    GI/Hepatic GERD  Medicated and Controlled,  Endo/Other  diabetes, Type 2, Insulin Dependent  Renal/GU Renal InsufficiencyRenal disease     Musculoskeletal   Abdominal   Peds  Hematology   Anesthesia Other Findings   Reproductive/Obstetrics                             Anesthesia Physical Anesthesia Plan  ASA: III  Anesthesia Plan: General   Post-op Pain Management:    Induction: Intravenous  PONV Risk Score and Plan: 2  Airway Management Planned: LMA  Additional Equipment:   Intra-op Plan:   Post-operative Plan: Extubation in OR  Informed Consent: I have reviewed the patients History and Physical, chart, labs and discussed the procedure including the risks, benefits and alternatives for the proposed anesthesia with the patient or authorized representative who has indicated his/her understanding and acceptance.     Plan Discussed with: CRNA and Surgeon  Anesthesia Plan Comments:         Anesthesia Quick Evaluation

## 2018-01-08 ENCOUNTER — Encounter (HOSPITAL_BASED_OUTPATIENT_CLINIC_OR_DEPARTMENT_OTHER): Payer: Self-pay | Admitting: Urology

## 2018-01-09 ENCOUNTER — Ambulatory Visit: Payer: Medicare Other | Admitting: Cardiovascular Disease

## 2018-01-18 ENCOUNTER — Other Ambulatory Visit: Payer: Self-pay | Admitting: Cardiology

## 2018-01-21 ENCOUNTER — Other Ambulatory Visit: Payer: Medicare Other

## 2018-01-22 ENCOUNTER — Other Ambulatory Visit: Payer: Medicare Other

## 2018-01-22 DIAGNOSIS — E785 Hyperlipidemia, unspecified: Secondary | ICD-10-CM

## 2018-01-23 LAB — LIPID PANEL
Chol/HDL Ratio: 4.5 ratio (ref 0.0–5.0)
Cholesterol, Total: 143 mg/dL (ref 100–199)
HDL: 32 mg/dL — ABNORMAL LOW (ref >39)
LDL Calculated: 68 mg/dL (ref 0–99)
Triglycerides: 213 mg/dL — ABNORMAL HIGH (ref 0–149)
VLDL Cholesterol Cal: 43 mg/dL — ABNORMAL HIGH (ref 5–40)

## 2018-04-09 NOTE — Progress Notes (Signed)
Cardiology Office Note    Date:  04/10/2018   ID:  Matthew [name], DOB [birth year], MRN [id number]  PCP:  Matthew Low, MD  Cardiologist: Matthew Chandler, MD EPS: None  No chief complaint on file.   History of Present Illness:  Matthew [name] is a 78 y.o. male with history of CAD S/P NSTEMI 12/2016 treated with 3 DES LAD and DES RCA, balloon angioplasty to the PLA branch.  2D echo 12/2016 normal LVEF.  GI bleed 01/2017 on aspirin and Brilinta.  Aspirin stopped.  Patient last saw Matthew. Angelena [name] 10/31/2017 and was placed on Zetia.  LDL came down to 68.  Triglycerides were 213 01/2018.  Patient comes in today for f/u. Patient says when it's cold outside below 30 he has chest pressure. A couple months ago he also had chest tightness when walking in [place]. BP also going up with walking 150 feet 180/87.  BP running high at home as well. Comes down if he sits for about 10 min.No regular exercise because of back/knee pain and balance issues.    Past Medical History:  Diagnosis Date  . Anticoagulated    plavix  . Arthritis    Shoulder, knees, back   . CAD in native artery cardiologist-  Matthew [name]   a. CAD/NSTEMI ,  cardiac cath staged stenting-- 12-25-2016  PTCA and DES x3 to prox. and mid LAD;  12-26-2016  PCI to PLA and DES x1 to midRCA,  EF 60-65%.  . Chronic lower back pain   . CKD (chronic kidney disease), stage III ([place])   . Complication of anesthesia    "Too much with shoulder surgery", pt. reports that he was told the at they "lost him, due to absorbing too much anesthesia".  shoulder surgery 1985  . DDD (degenerative disc disease), lumbosacral   . GERD (gastroesophageal reflux disease)   . History of gastric ulcer 01/2017  . History of kidney stones   . History of malignant melanoma    right side of nose  . History of non-ST elevation myocardial infarction (NSTEMI) 12/23/2017   s/p  staged cardiac cath,  s/p  PCI and DEStenting  . Hyperlipidemia   . Hypertension    . IDA (iron deficiency anemia)   . Nocturia   . RBBB   . Renal calculus, right   . S/P drug eluting coronary stent placement 12-25-2017,  12-26-2017   PTCA and DES x3 to prox. and mid LAD;  PCI to PLA and DES x1 to [facility]  . Type 2 diabetes mellitus treated with insulin ([place])    followed by pcp    Past Surgical History:  Procedure Laterality Date  . ANAL FISSURE REPAIR  X 2  . CATARACT EXTRACTION W/ INTRAOCULAR LENS  IMPLANT, BILATERAL Bilateral 2017;  2015  . COLONOSCOPY WITH PROPOFOL N/A 06/05/2016   Procedure: COLONOSCOPY WITH PROPOFOL;  Surgeon: Matthew Fair, MD;  Location: [facility];  Service: Endoscopy;  Laterality: N/A;  . CORONARY ANGIOGRAPHY N/A 12/26/2016   Procedure: CORONARY ANGIOGRAPHY;  Surgeon: Matthew Sine, MD;  Location: [place] [facility];  Service: Cardiovascular;  Laterality: N/A;  . CORONARY STENT INTERVENTION N/A 12/25/2016   Procedure: CORONARY STENT INTERVENTION;  Surgeon: Matthew Blanks, MD;  Location: [place] [facility];  Service: Cardiovascular;  Laterality: N/A;  . CORONARY STENT INTERVENTION N/A 12/26/2016   Procedure: CORONARY STENT INTERVENTION;  Surgeon: Matthew Sine, MD;  Location: [place] [facility];  Service: Cardiovascular;  Laterality: N/A;  .  CYSTOSCOPY/URETEROSCOPY/HOLMIUM LASER/STENT PLACEMENT Right 01/07/2018   Procedure: RIGHT URETEROSCOPY/HOLMIUM LASER/STENT PLACEMENT;  Surgeon: Matthew Mallow, MD;  Location: [facility];  Service: Urology;  Laterality: Right;  . ESOPHAGOGASTRODUODENOSCOPY (EGD) WITH PROPOFOL N/A 01/12/2017   Procedure: ESOPHAGOGASTRODUODENOSCOPY (EGD) WITH PROPOFOL;  Surgeon: Matthew Corner, MD;  Location: [facility];  Service: Endoscopy;  Laterality: N/A;  . HAND TENDON SURGERY Right 10-29-2002   Matthew [name] @[facility]    right index and thumb  . KNEE ARTHROSCOPY Bilateral 2009-;2010   @[facility]   . LEFT HEART CATH AND CORONARY ANGIOGRAPHY N/A 12/25/2016   Procedure: LEFT HEART CATH AND  CORONARY ANGIOGRAPHY;  Surgeon: Matthew Blanks, MD;  Location: [place] [facility];  Service: Cardiovascular;  Laterality: N/A;  . LUMBAR LAMINECTOMY/DECOMPRESSION MICRODISCECTOMY N/A 06/09/2015   Procedure: Laminectomy - T12-L1;  Surgeon: Matthew Moore, MD;  Location: [place] [facility];  Service: Neurosurgery;  Laterality: N/A;  Laminectomy - T12-L1  . MEDIAL PARTIAL KNEE REPLACEMENT Bilateral 2009-2010    [facility]   . MELANOMA EXCISION Right    "side of my nose"  . SHOULDER SURGERY Left 1985  . TOTAL SHOULDER ARTHROPLASTY Left 12/06/2012   Procedure: LEFT TOTAL SHOULDER ARTHROPLASTY VERSES A REVERSE TOTAL SHOULDER ARTHROPLASTY;  Surgeon: Matthew Schooling, MD;  Location: [place];  Service: Orthopedics;  Laterality: Left;    Current Medications: Current Meds  Medication Sig  . acetaminophen (TYLENOL) 325 MG tablet Take 2 tablets (650 mg total) by mouth every 6 (six) hours as needed for mild pain or headache.  Marland Kitchen amLODipine (NORVASC) 5 MG tablet Take 5 mg by mouth every morning.   . clopidogrel (PLAVIX) 75 MG tablet Take 1 tablet (75 mg total) by mouth daily.  Marland Kitchen ezetimibe (ZETIA) 10 MG tablet Take 1 tablet (10 mg total) by mouth daily.  Marland Kitchen FERREX 150 150 MG capsule Take 1 capsule by mouth daily.  Marland Kitchen HUMALOG MIX 75/25 KWIKPEN (75-25) 100 UNIT/ML Kwikpen Inject 20 Units into the skin 2 (two) times daily.   . isosorbide mononitrate (IMDUR) 30 MG 24 hr tablet TAKE 1 TABLET BY MOUTH EVERY DAY  . metoprolol tartrate (LOPRESSOR) 25 MG tablet TAKE 1 TABLET BY MOUTH TWICE A DAY  . nitroGLYCERIN (NITROSTAT) 0.4 MG SL tablet Place 1 tablet (0.4 mg total) under the tongue every 5 (five) minutes as needed for chest pain.  . Omega-3 Fatty Acids (FISH OIL) 1000 MG CAPS Take 1,000 mg by mouth daily.  . pantoprazole (PROTONIX) 40 MG tablet Take 40 mg by mouth daily.  Marland Kitchen tiZANidine (ZANAFLEX) 4 MG tablet Take 4 mg by mouth 3 (three) times daily as needed.  . traMADol (ULTRAM) 50 MG tablet Take 1 tablet by mouth  daily.     Allergies:   Statins; Oxycodone; Hydrocodone; Codeine; and Morphine and related   Social History   Socioeconomic History  . Marital status: Married    Spouse name: Not on file  . Number of children: Not on file  . Years of education: Not on file  . Highest education level: Not on file  Occupational History  . Not on file  Social Needs  . Financial resource strain: Not on file  . Food insecurity:    Worry: Not on file    Inability: Not on file  . Transportation needs:    Medical: Not on file    Non-medical: Not on file  Tobacco Use  . Smoking status: Never Smoker  . Smokeless tobacco: Never Used  Substance and Sexual Activity  .  Alcohol use: No  . Drug use: No  . Sexual activity: Yes  Lifestyle  . Physical activity:    Days per week: Not on file    Minutes per session: Not on file  . Stress: Not on file  Relationships  . Social connections:    Talks on phone: Not on file    Gets together: Not on file    Attends religious service: Not on file    Active member of club or organization: Not on file    Attends meetings of clubs or organizations: Not on file    Relationship status: Not on file  Other Topics Concern  . Not on file  Social History Narrative  . Not on file     Family History:  The patient's family history includes CAD in his father and sister.   ROS:   Please see the history of present illness.    Review of Systems  Constitution: Negative.  HENT: Negative.   Cardiovascular: Positive for chest pain.  Respiratory: Negative.   Endocrine: Negative.   Hematologic/Lymphatic: Negative.   Musculoskeletal: Positive for arthritis, back pain and joint pain.  Gastrointestinal: Negative.   Genitourinary: Negative.   Neurological: Positive for loss of balance.   All other systems reviewed and are negative.   PHYSICAL EXAM:   VS:  BP (!) 142/80   Pulse (!) 56   Ht 5\' 9"  (1.753 m)   Wt 201 lb 12.8 oz (91.5 kg)   SpO2 97%   BMI 29.80 kg/m     Physical Exam  GEN: Well nourished, well developed, in no acute distress decreased breath sounds but Neck: no JVD, carotid bruits, or masses Cardiac:RRR; positive S4 no murmurs, rubs,   Respiratory:  clear to auscultation bilaterally, normal work of breathing GI: soft, nontender, nondistended, + BS Ext: without cyanosis, clubbing, or edema, Good distal pulses bilaterally MS: no deformity or atrophy  Skin: warm and dry, no rash Neuro:  Alert and Oriented x 3  Psych: euthymic mood, full affect  Wt Readings from Last 3 Encounters:  04/10/18 201 lb 12.8 oz (91.5 kg)  01/07/18 187 lb 14.4 oz (85.2 kg)  10/31/17 190 lb (86.2 kg)      Studies/Labs Reviewed:   EKG:  EKG is ordered today.  The ekg ordered today demonstrates sinus rhythm with right bundle branch block and PVC, no acute change from prior tracing  Recent Labs: 06/26/2017: ALT 21 01/07/2018: Hemoglobin 8.2; Potassium 4.2; Sodium 141   Lipid Panel    Component Value Date/Time   CHOL 143 01/22/2018 1257   TRIG 213 (H) 01/22/2018 1257   HDL 32 (L) 01/22/2018 1257   CHOLHDL 4.5 01/22/2018 1257   CHOLHDL 4.4 12/24/2016 0634   VLDL 13 12/24/2016 0634   LDLCALC 68 01/22/2018 1257    Additional studies/ records that were reviewed today include:    Echo 12/26/16: - Left ventricle: The cavity size was normal. Wall thickness was   normal. Systolic function was normal. The estimated ejection   fraction was in the range of 60% to 65%. Wall motion was normal;   there were no regional wall motion abnormalities. Left   ventricular diastolic function parameters were normal. - Mitral valve: Calcified annulus. - Atrial septum: No defect or patent foramen ovale was identified. - Pulmonary arteries: PA peak pressure: 36 mm Hg (S  1st Diag lesion is 99% stenosed.  Previously placed Prox LAD drug eluting stent is widely patent.  Balloon angioplasty was performed.  Colon Flattery  Cx to Mid Cx lesion is 20% stenosed.  Prox RCA lesion is  85% stenosed.  Ost RPDA to RPDA lesion is 60% stenosed.  Post Atrio lesion is 99% stenosed.  Post intervention, there is a 10% residual stenosis.  Post intervention, there is a 0% residual stenosis.  A stent was successfully placed.  Previously placed Mid LAD-1 drug eluting stent is widely patent.  Balloon angioplasty was performed.  Mid LAD-2 lesion is 50% stenosed.   Relook of the LAD reveals 3 widely patent tandem stents without restenosis.  There is 50% narrowing in the LAD in the mid segment beyond the distal stent.   The RCA had previously noted 85% eccentric stenosis on the bend in the mid RCA, there is diffuse 50-60% PDA stenoses and 99% PLA stenosis.  Following successful PCI the PLA 99% stenosis treated with PTCA was reduced to less than 10% and a 85% eccentric mid RCA stenosis was reduced to 0% with insertion of a 2.515 mm Xience Sierra DES stent postdilated to 2.75 mm.   RECOMMENDATION: The patient should continue with the DAPT for minimum of one-year.  Medical therapy for concomitant CAD and aggressive lipid-lowering strategy.     ASSESSMENT:    1. CAD S/P percutaneous coronary angioplasty   2. Hyperlipidemia, unspecified hyperlipidemia type   3. Chest pain, unspecified type      PLAN:  In order of problems listed above:  CAD status post NSTEMI treated with DES x3 to the LAD, DES to the RCA x1, balloon angioplasty to the PLA 12/2016 normal LVEF.  GI bleed on aspirin and Brilinta, aspirin stopped and now on Plavix.  Patient having angina predominantly in the cold when walking to the car.  He does not get any regular exercise.  Only started noticing it this winter.  Blood pressures been going up as well.  Long discussion with patient concerning testing with Lexiscan versus cardiac catheterization.  At this point he would prefer to proceed with [facility].  Increase Imdur to 60 mg once daily.  Hyperlipidemia intolerant to statins but tolerating Zetia and LDL  at goal 2 months ago-patient scheduled for fasting lipid panels tomorrow at the [facility] hypertension blood pressure has been going up and down recently.  It goes high with very little exertion.  With recent chest pain will increase Imdur to 60 mg once daily.  I will see him back next week in follow-up.   Medication Adjustments/Labs and Tests Ordered: Current medicines are reviewed at length with the patient today.  Concerns regarding medicines are outlined above.  Medication changes, Labs and Tests ordered today are listed in the Patient Instructions below. There are no Patient Instructions on file for this visit.   Sumner Boast, PA-C  04/10/2018 9:00 AM    [facility] [address], [place], [place]  [address] Phone: [phone]; Fax: [phone]

## 2018-04-10 ENCOUNTER — Telehealth (HOSPITAL_COMMUNITY): Payer: Self-pay | Admitting: *Deleted

## 2018-04-10 ENCOUNTER — Encounter: Payer: Self-pay | Admitting: Physician Assistant

## 2018-04-10 ENCOUNTER — Ambulatory Visit: Payer: Medicare Other | Admitting: Physician Assistant

## 2018-04-10 VITALS — BP 142/80 | HR 56 | Ht 69.0 in | Wt 201.8 lb

## 2018-04-10 DIAGNOSIS — I10 Essential (primary) hypertension: Secondary | ICD-10-CM

## 2018-04-10 DIAGNOSIS — R079 Chest pain, unspecified: Secondary | ICD-10-CM

## 2018-04-10 DIAGNOSIS — Z9861 Coronary angioplasty status: Secondary | ICD-10-CM

## 2018-04-10 DIAGNOSIS — I2510 Atherosclerotic heart disease of native coronary artery without angina pectoris: Secondary | ICD-10-CM | POA: Diagnosis not present

## 2018-04-10 DIAGNOSIS — E785 Hyperlipidemia, unspecified: Secondary | ICD-10-CM | POA: Diagnosis not present

## 2018-04-10 MED ORDER — ISOSORBIDE MONONITRATE ER 60 MG PO TB24: 60.0000 mg | ORAL_TABLET | Freq: Every day | ORAL | 3 refills | Status: DC

## 2018-04-10 NOTE — Telephone Encounter (Signed)
Attempted to call patient regarding upcoming appointment- no answer, unable to leave a message.  Kirstie Peri, RN

## 2018-04-10 NOTE — Patient Instructions (Signed)
Medication Instructions:  INCREASE: Isosorbide to 60 mg once a day   If you need a refill on your cardiac medications before your next appointment, please call your pharmacy.   Lab work: None  If you have labs (blood work) drawn today and your tests are completely normal, you will receive your results only by: Marland Kitchen MyChart Message (if you have MyChart) OR . A paper copy in the mail If you have any lab test that is abnormal or we need to change your treatment, we will call you to review the results.  Testing/Procedures: Your physician has requested that you have a lexiscan myoview. For further information please visit [email or web address]. Please follow instruction sheet, as given.  Follow-Up: Follow up with Ermalinda Barrios PA-C on 04/17/2018 @ 2:00 PM  Any Other Special Instructions Will Be Listed Below (If Applicable).

## 2018-04-10 NOTE — Telephone Encounter (Signed)
Patient given detailed instructions per Myocardial Perfusion Study Information Sheet for the test on 04/15/18. Patient notified to arrive 15 minutes early and that it is imperative to arrive on time for appointment to keep from having the test rescheduled.  If you need to cancel or reschedule your appointment, please call the office within 24 hours of your appointment. . Patient verbalized understanding. Kirstie Peri, RN

## 2018-04-10 NOTE — Addendum Note (Signed)
Addended by: Mendel Ryder on: 04/10/2018 09:20 AM   Modules accepted: Orders

## 2018-04-15 ENCOUNTER — Ambulatory Visit (HOSPITAL_COMMUNITY): Payer: Medicare Other | Attending: Cardiovascular Disease

## 2018-04-15 DIAGNOSIS — R079 Chest pain, unspecified: Secondary | ICD-10-CM

## 2018-04-15 LAB — MYOCARDIAL PERFUSION IMAGING
LV dias vol: 77 mL (ref 62–150)
LV sys vol: 24 mL
Peak HR: 78 {beats}/min
Rest HR: 59 {beats}/min
SDS: 1
SRS: 0
SSS: 1
TID: 1

## 2018-04-15 MED ORDER — REGADENOSON 0.4 MG/5ML IV SOLN
0.4000 mg | Freq: Once | INTRAVENOUS | Status: AC
  Administered 2018-04-15: 0.4 mg via INTRAVENOUS

## 2018-04-15 MED ORDER — TECHNETIUM TC 99M TETROFOSMIN IV KIT
10.4000 | PACK | Freq: Once | INTRAVENOUS | Status: AC | PRN
  Administered 2018-04-15: 10.4 via INTRAVENOUS
  Filled 2018-04-15: qty 11

## 2018-04-15 MED ORDER — TECHNETIUM TC 99M TETROFOSMIN IV KIT
32.3000 | PACK | Freq: Once | INTRAVENOUS | Status: AC | PRN
  Administered 2018-04-15: 32.3 via INTRAVENOUS
  Filled 2018-04-15: qty 33

## 2018-04-17 ENCOUNTER — Ambulatory Visit: Payer: Medicare Other | Admitting: Physician Assistant

## 2018-05-02 ENCOUNTER — Ambulatory Visit: Payer: Medicare Other | Admitting: Cardiovascular Disease

## 2018-11-04 ENCOUNTER — Telehealth: Payer: Self-pay | Admitting: Cardiovascular Disease

## 2018-11-04 NOTE — Telephone Encounter (Signed)
New  Message     Patient wants to know if it will be ok for them to take any over the counter medication for a cold.

## 2018-11-04 NOTE — Telephone Encounter (Signed)
I spoke with pt's wife and told her it was OK to take Coricidin HBP or plain Mucinex or antihistamine.  I told her pt should avoid decongestants.

## 2018-11-11 ENCOUNTER — Other Ambulatory Visit: Payer: Self-pay

## 2018-11-11 ENCOUNTER — Ambulatory Visit: Payer: Medicare Other | Admitting: Cardiovascular Disease

## 2018-11-11 ENCOUNTER — Encounter: Payer: Self-pay | Admitting: Cardiovascular Disease

## 2018-11-11 VITALS — BP 138/80 | HR 71 | Ht 69.0 in | Wt 210.0 lb

## 2018-11-11 DIAGNOSIS — I25118 Atherosclerotic heart disease of native coronary artery with other forms of angina pectoris: Secondary | ICD-10-CM | POA: Diagnosis not present

## 2018-11-11 DIAGNOSIS — E785 Hyperlipidemia, unspecified: Secondary | ICD-10-CM

## 2018-11-11 NOTE — Progress Notes (Signed)
Chief Complaint  Patient presents with  . Follow-up    CAD   History of Present Illness: 78 yo male with history of DM, HTN and CAD here today for follow up. He was admitted to [facility] November 2018 with a NSTEMI. Cardiac cath November 2018 with severe stenosis in the mid LAD and mid RCA treated with 3 drug eluting stents in LAD and one drug eluting stent in the mid RCA. The posterolateral branch had a severe stenosis treated with balloon angioplasty. Echo November 2018 with normal LV systolic function. He was placed on ASA and Brilinta but admitted December 2018 with melena and chest pain. EGD showed a non bleeding ulcer and gastritis. ASA was stopped and Brilinta was continued. He is now on Plavix. He had severe abdominal pains while on a statin and stopped this. He has tolerated Zetia. He was seen in March 2020 by Ermalinda Barrios, PA and had c/o chest pain when walking in cold weather. Imdur was increased. Nuclear stress test 04/15/18 with no ischemia.   He is here today for follow up. One episode of chest pain last week that resolved with using one SL NTG. No dyspnea. No palpitations, lower extremity edema, orthopnea, PND. He has rare dizziness.     Primary Care Physician: Wenda Low, MD  Past Medical History:  Diagnosis Date  . Anticoagulated    plavix  . Arthritis    Shoulder, knees, back   . CAD in native artery cardiologist-  dr Angelena Form   a. CAD/NSTEMI ,  cardiac cath staged stenting-- 12-25-2016  PTCA and DES x3 to prox. and mid LAD;  12-26-2016  PCI to PLA and DES x1 to midRCA,  EF 60-65%.  . Chronic lower back pain   . CKD (chronic kidney disease), stage III   . Complication of anesthesia    "Too much with shoulder surgery", pt. reports that he was told the at they "lost him, due to absorbing too much anesthesia".  shoulder surgery 1985  . DDD (degenerative disc disease), lumbosacral   . GERD (gastroesophageal reflux disease)   . History of gastric ulcer 01/2017  . History of  kidney stones   . History of malignant melanoma    right side of nose  . History of non-ST elevation myocardial infarction (NSTEMI) 12/23/2017   s/p  staged cardiac cath,  s/p  PCI and DEStenting  . Hyperlipidemia   . Hypertension   . IDA (iron deficiency anemia)   . Nocturia   . RBBB   . Renal calculus, right   . S/P drug eluting coronary stent placement 12-25-2017,  12-26-2017   PTCA and DES x3 to prox. and mid LAD;  PCI to PLA and DES x1 to [facility]  . Type 2 diabetes mellitus treated with insulin ([place])    followed by pcp    Past Surgical History:  Procedure Laterality Date  . ANAL FISSURE REPAIR  X 2  . CATARACT EXTRACTION W/ INTRAOCULAR LENS  IMPLANT, BILATERAL Bilateral 2017;  2015  . COLONOSCOPY WITH PROPOFOL N/A 06/05/2016   Procedure: COLONOSCOPY WITH PROPOFOL;  Surgeon: Garlan Fair, MD;  Location: [facility];  Service: Endoscopy;  Laterality: N/A;  . CORONARY ANGIOGRAPHY N/A 12/26/2016   Procedure: CORONARY ANGIOGRAPHY;  Surgeon: Troy Sine, MD;  Location: [place] [facility];  Service: Cardiovascular;  Laterality: N/A;  . CORONARY STENT INTERVENTION N/A 12/25/2016   Procedure: CORONARY STENT INTERVENTION;  Surgeon: Burnell Blanks, MD;  Location: [place] [facility];  Service: Cardiovascular;  Laterality: N/A;  . CORONARY STENT INTERVENTION N/A 12/26/2016   Procedure: CORONARY STENT INTERVENTION;  Surgeon: Troy Sine, MD;  Location: [place] [facility];  Service: Cardiovascular;  Laterality: N/A;  . CYSTOSCOPY/URETEROSCOPY/HOLMIUM LASER/STENT PLACEMENT Right 01/07/2018   Procedure: RIGHT URETEROSCOPY/HOLMIUM LASER/STENT PLACEMENT;  Surgeon: Lucas Mallow, MD;  Location: [facility];  Service: Urology;  Laterality: Right;  . ESOPHAGOGASTRODUODENOSCOPY (EGD) WITH PROPOFOL N/A 01/12/2017   Procedure: ESOPHAGOGASTRODUODENOSCOPY (EGD) WITH PROPOFOL;  Surgeon: Wilford Corner, MD;  Location: [place];  Service: Endoscopy;  Laterality:  N/A;  . HAND TENDON SURGERY Right 10-29-2002   dr Burney Gauze @[facility]    right index and thumb  . KNEE ARTHROSCOPY Bilateral 2009-;2010   @[facility]   . LEFT HEART CATH AND CORONARY ANGIOGRAPHY N/A 12/25/2016   Procedure: LEFT HEART CATH AND CORONARY ANGIOGRAPHY;  Surgeon: Burnell Blanks, MD;  Location: [place] [facility];  Service: Cardiovascular;  Laterality: N/A;  . LUMBAR LAMINECTOMY/DECOMPRESSION MICRODISCECTOMY N/A 06/09/2015   Procedure: Laminectomy - T12-L1;  Surgeon: Eustace Moore, MD;  Location: [place] [facility];  Service: Neurosurgery;  Laterality: N/A;  Laminectomy - T12-L1  . MEDIAL PARTIAL KNEE REPLACEMENT Bilateral 2009-2010    [facility]   . MELANOMA EXCISION Right    "side of my nose"  . SHOULDER SURGERY Left 1985  . TOTAL SHOULDER ARTHROPLASTY Left 12/06/2012   Procedure: LEFT TOTAL SHOULDER ARTHROPLASTY VERSES A REVERSE TOTAL SHOULDER ARTHROPLASTY;  Surgeon: Augustin Schooling, MD;  Location: [place];  Service: Orthopedics;  Laterality: Left;    Current Outpatient Medications  Medication Sig Dispense Refill  . acetaminophen (TYLENOL) 325 MG tablet Take 2 tablets (650 mg total) by mouth every 6 (six) hours as needed for mild pain or headache.    Marland Kitchen amLODipine (NORVASC) 5 MG tablet Take 5 mg by mouth every morning.     . clopidogrel (PLAVIX) 75 MG tablet Take 1 tablet (75 mg total) by mouth daily. 90 tablet 3  . ezetimibe (ZETIA) 10 MG tablet Take 1 tablet (10 mg total) by mouth daily. 30 tablet 11  . FERREX 150 150 MG capsule Take 1 capsule by mouth daily.    Marland Kitchen HUMALOG MIX 75/25 KWIKPEN (75-25) 100 UNIT/ML Kwikpen Inject 24 Units into the skin 2 (two) times daily.   3  . isosorbide mononitrate (IMDUR) 60 MG 24 hr tablet Take 1 tablet (60 mg total) by mouth daily. 90 tablet 3  . metoprolol tartrate (LOPRESSOR) 25 MG tablet TAKE 1 TABLET BY MOUTH TWICE A DAY 180 tablet 1  . nitroGLYCERIN (NITROSTAT) 0.4 MG SL tablet Place 1 tablet (0.4 mg total) under the tongue every 5 (five) minutes as  needed for chest pain. 30 tablet 12  . Omega-3 Fatty Acids (FISH OIL) 1000 MG CAPS Take 1,000 mg by mouth daily.    . pantoprazole (PROTONIX) 40 MG tablet Take 40 mg by mouth daily.    Marland Kitchen tiZANidine (ZANAFLEX) 4 MG tablet Take 4 mg by mouth 3 (three) times daily as needed.  3  . traMADol (ULTRAM) 50 MG tablet Take 1 tablet by mouth daily.     No current facility-administered medications for this visit.     Allergies  Allergen Reactions  . Statins Other (See Comments)    Severe stomach pain.  . Oxycodone Itching  . Hydrocodone Other (See Comments)    "messes with my hearing"  . Codeine Itching  . Morphine And Related Itching    Social History   Socioeconomic  History  . Marital status: Married    Spouse name: Not on file  . Number of children: Not on file  . Years of education: Not on file  . Highest education level: Not on file  Occupational History  . Not on file  Social Needs  . Financial resource strain: Not on file  . Food insecurity    Worry: Not on file    Inability: Not on file  . Transportation needs    Medical: Not on file    Non-medical: Not on file  Tobacco Use  . Smoking status: Never Smoker  . Smokeless tobacco: Never Used  Substance and Sexual Activity  . Alcohol use: No  . Drug use: No  . Sexual activity: Yes  Lifestyle  . Physical activity    Days per week: Not on file    Minutes per session: Not on file  . Stress: Not on file  Relationships  . Social Herbalist on phone: Not on file    Gets together: Not on file    Attends religious service: Not on file    Active member of club or organization: Not on file    Attends meetings of clubs or organizations: Not on file    Relationship status: Not on file  . Intimate partner violence    Fear of current or ex partner: Not on file    Emotionally abused: Not on file    Physically abused: Not on file    Forced sexual activity: Not on file  Other Topics Concern  . Not on file  Social  History Narrative  . Not on file    Family History  Problem Relation Age of Onset  . CAD Father   . CAD Sister     Review of Systems:  As stated in the HPI and otherwise negative.   BP 138/80   Pulse 71   Ht 5\' 9"  (1.753 m)   Wt 210 lb (95.3 kg)   SpO2 97%   BMI 31.01 kg/m   Physical Examination:  General: Well developed, well nourished, NAD  HEENT: OP clear, mucus membranes moist  SKIN: warm, dry. No rashes. Neuro: No focal deficits  Musculoskeletal: Muscle strength 5/5 all ext  Psychiatric: Mood and affect normal  Neck: No JVD, no carotid bruits, no thyromegaly, no lymphadenopathy.  Lungs:Clear bilaterally, no wheezes, rhonci, crackles Cardiovascular: Regular rate and rhythm. No murmurs, gallops or rubs. Abdomen:Soft. Bowel sounds present. Non-tender.  Extremities: No lower extremity edema. Pulses are 2 + in the bilateral DP/PT.  Echo 12/26/16: - Left ventricle: The cavity size was normal. Wall thickness was   normal. Systolic function was normal. The estimated ejection   fraction was in the range of 60% to 65%. Wall motion was normal;   there were no regional wall motion abnormalities. Left   ventricular diastolic function parameters were normal. - Mitral valve: Calcified annulus. - Atrial septum: No defect or patent foramen ovale was identified. - Pulmonary arteries: PA peak pressure: 36 mm Hg (S).  EKG:  EKG is not ordered today. The ekg ordered today demonstrates   Recent Labs: 01/07/2018: Hemoglobin 8.2; Potassium 4.2; Sodium 141   Lipid Panel    Component Value Date/Time   CHOL 143 01/22/2018 1257   TRIG 213 (H) 01/22/2018 1257   HDL 32 (L) 01/22/2018 1257   CHOLHDL 4.5 01/22/2018 1257   CHOLHDL 4.4 12/24/2016 0634   VLDL 13 12/24/2016 0634   LDLCALC 68 01/22/2018 1257  Wt Readings from Last 3 Encounters:  11/11/18 210 lb (95.3 kg)  04/15/18 201 lb (91.2 kg)  04/10/18 201 lb 12.8 oz (91.5 kg)     Other studies Reviewed: Additional  studies/ records that were reviewed today include: . Review of the above records demonstrates:    Assessment and Plan:   1. CAD with angina: Stable chronic chest pains on Imdur. Drug eluting stents placed in the LAD and RCA in November 2018. He has normal LV systolic function. Continue Plavix, Imdur and beta blocker. Statin stopped due to abdominal pain. Continue Zetia.   2. Hyperlipidemia: LDL at goal on Zetia. He does not tolerate statins. Will need repeat lipid profile December 2020.   Current medicines are reviewed at length with the patient today.  The patient does not have concerns regarding medicines.  The following changes have been made:  no change  Labs/ tests ordered today include:   No orders of the defined types were placed in this encounter.    Disposition:   FU with me in 6 months   Signed, Lauree Chandler, MD 11/11/2018 4:17 PM    [facility] [place], [place], [place]  [address] Phone: [phone]; Fax: [phone]

## 2018-11-11 NOTE — Patient Instructions (Signed)
Medication Instructions:  No changes If you need a refill on your cardiac medications before your next appointment, please call your pharmacy.   Lab work: none If you have labs (blood work) drawn today and your tests are completely normal, you will receive your results only by: . MyChart Message (if you have MyChart) OR . A paper copy in the mail If you have any lab test that is abnormal or we need to change your treatment, we will call you to review the results.  Testing/Procedures: none  Follow-Up: At [facility], you and your health needs are our priority.  As part of our continuing mission to provide you with exceptional heart care, we have created designated Provider Care Teams.  These Care Teams include your primary Cardiologist (physician) and Advanced Practice Providers (APPs -  Physician Assistants and Nurse Practitioners) who all work together to provide you with the care you need, when you need it. You will need a follow up appointment in 6 months.  Please call our office 2 months in advance to schedule this appointment.  You may see Christopher McAlhany, MD or one of the following Advanced Practice Providers on your designated Care Team:   Brittainy Simmons, PA-C Dayna Dunn, PA-C . Michele Lenze, PA-C  Any Other Special Instructions Will Be Listed Below (If Applicable).    

## 2019-05-13 ENCOUNTER — Ambulatory Visit
Admission: RE | Admit: 2019-05-13 | Discharge: 2019-05-13 | Disposition: A | Discharge status: Home / Self Care | Payer: Medicare Other | Source: Ambulatory Visit | Attending: Internal Medicine | Admitting: Internal Medicine

## 2019-05-13 ENCOUNTER — Other Ambulatory Visit: Payer: Self-pay

## 2019-05-13 ENCOUNTER — Other Ambulatory Visit: Payer: Self-pay | Admitting: Internal Medicine

## 2019-05-13 DIAGNOSIS — R270 Ataxia, unspecified: Secondary | ICD-10-CM

## 2019-06-06 IMAGING — CR DG CHEST 2V
2 series · 2 of 2 positions shown · non-contrast
Comparison: Chest radiograph dated 06/03/2015

CLINICAL DATA: 76-year-old male with chest pain and shortness of
breath.

EXAM:
CHEST  2 VIEW

[w chest pa]
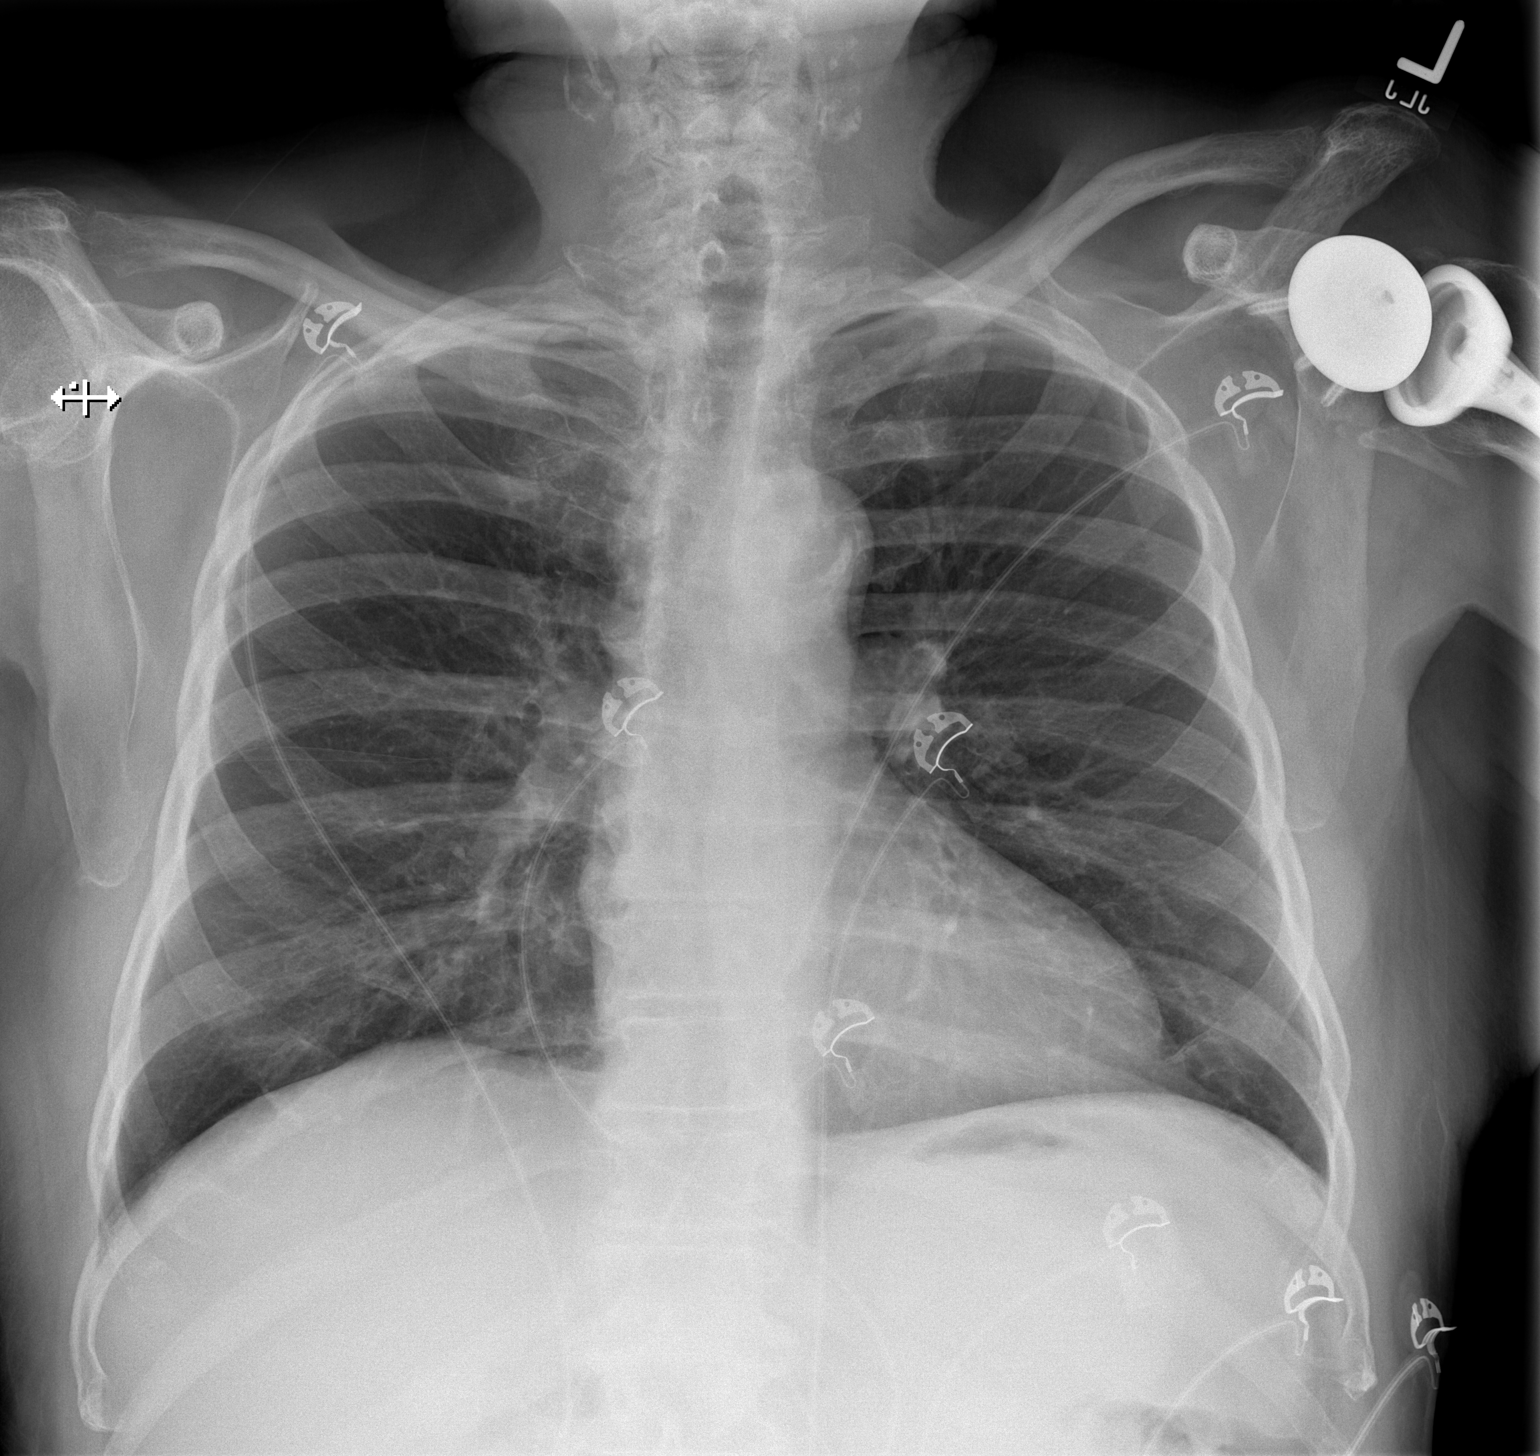

[w chest lat]
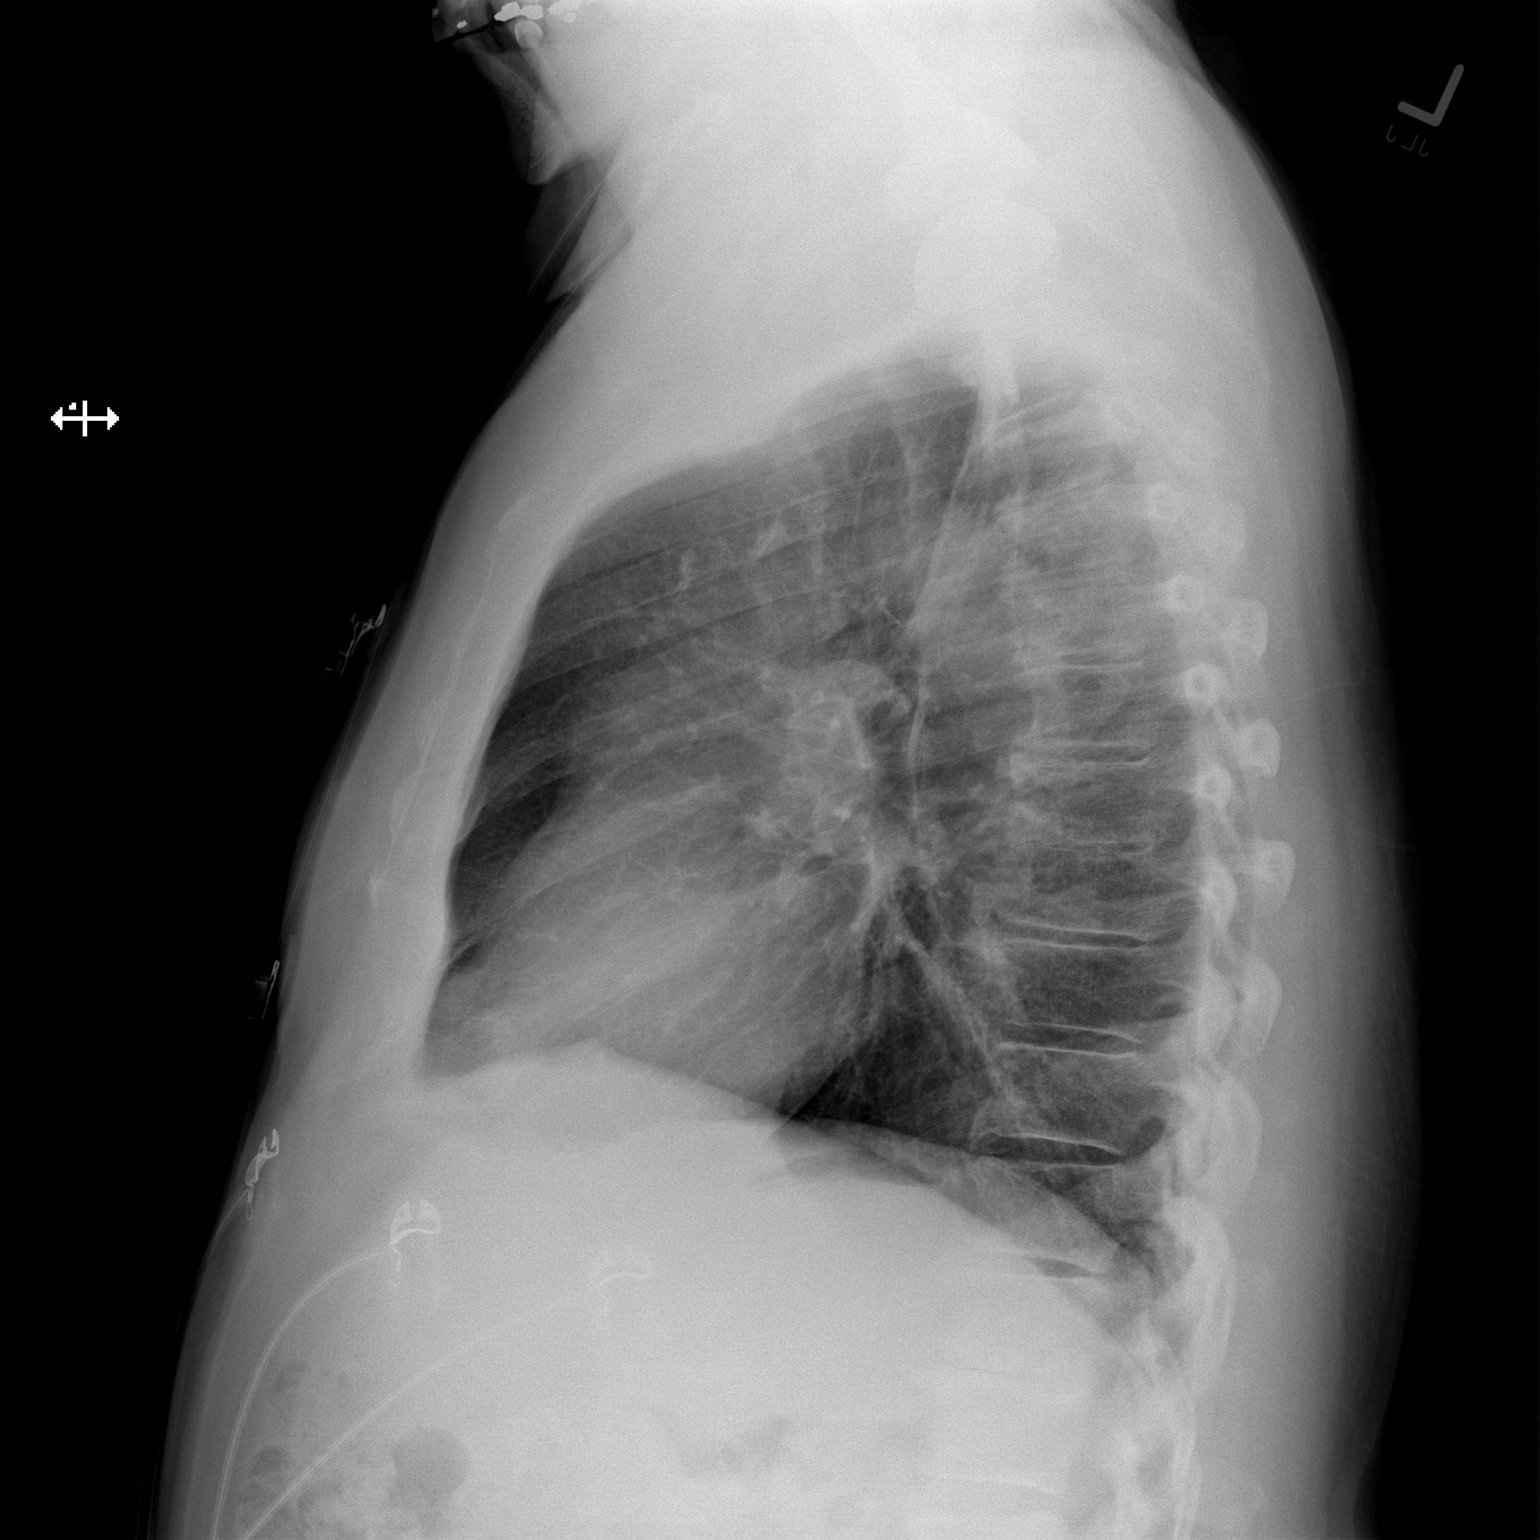

[2 of 2 positions shown; findings below may reference images not displayed]

FINDINGS: The lungs are clear. There is no pleural effusion or pneumothorax.
The cardiac silhouette is within normal limits. There is
atherosclerotic calcification of the aortic arch. There is
degenerative changes of the spine. There is a left shoulder
arthroplasty. No acute osseous pathology.
IMPRESSION: No active cardiopulmonary disease.

## 2019-06-25 ENCOUNTER — Ambulatory Visit: Payer: Medicare Other | Admitting: Cardiovascular Disease

## 2019-06-25 ENCOUNTER — Other Ambulatory Visit: Payer: Self-pay

## 2019-06-25 ENCOUNTER — Encounter: Payer: Self-pay | Admitting: Cardiovascular Disease

## 2019-06-25 VITALS — BP 134/64 | HR 56 | Ht 69.0 in | Wt 209.0 lb

## 2019-06-25 DIAGNOSIS — I25118 Atherosclerotic heart disease of native coronary artery with other forms of angina pectoris: Secondary | ICD-10-CM

## 2019-06-25 DIAGNOSIS — E785 Hyperlipidemia, unspecified: Secondary | ICD-10-CM

## 2019-06-25 NOTE — Patient Instructions (Signed)

## 2019-06-25 NOTE — Progress Notes (Signed)
Chief Complaint  Patient presents with  . Follow-up    CAD   History of Present Illness: 79 yo male with history of DM, HTN and CAD here today for follow up. He was admitted to [facility] November 2018 with a NSTEMI. Cardiac cath November 2018 with severe stenosis in the mid LAD and mid RCA treated with 3 drug eluting stents in LAD and one drug eluting stent in the mid RCA. The posterolateral branch had a severe stenosis treated with balloon angioplasty. Echo November 2018 with normal LV systolic function. He was placed on ASA and Brilinta but admitted December 2018 with melena and chest pain. EGD showed a non bleeding ulcer and gastritis. ASA was stopped and Brilinta was continued. He is now on Plavix. He had severe abdominal pains while on a statin and stopped this. He has tolerated Zetia. He was seen in March 2020 by Ermalinda Barrios, PA and had c/o chest pain when walking in cold weather. Imdur was increased. Nuclear stress test 04/15/18 with no ischemia.   He is here today for follow up. The patient denies any chest pain, dyspnea, palpitations, lower extremity edema, orthopnea, PND, dizziness, near syncope or syncope. Rare chest pains that last less than a minute.    Primary Care Physician: Wenda Low, MD  Past Medical History:  Diagnosis Date  . Anticoagulated    plavix  . Arthritis    Shoulder, knees, back   . CAD in native artery cardiologist-  dr Angelena Form   a. CAD/NSTEMI ,  cardiac cath staged stenting-- 12-25-2016  PTCA and DES x3 to prox. and mid LAD;  12-26-2016  PCI to PLA and DES x1 to midRCA,  EF 60-65%.  . Chronic lower back pain   . CKD (chronic kidney disease), stage III   . Complication of anesthesia    "Too much with shoulder surgery", pt. reports that he was told the at they "lost him, due to absorbing too much anesthesia".  shoulder surgery 1985  . DDD (degenerative disc disease), lumbosacral   . GERD (gastroesophageal reflux disease)   . History of gastric ulcer 01/2017   . History of kidney stones   . History of malignant melanoma    right side of nose  . History of non-ST elevation myocardial infarction (NSTEMI) 12/23/2017   s/p  staged cardiac cath,  s/p  PCI and DEStenting  . Hyperlipidemia   . Hypertension   . IDA (iron deficiency anemia)   . Nocturia   . RBBB   . Renal calculus, right   . S/P drug eluting coronary stent placement 12-25-2017,  12-26-2017   PTCA and DES x3 to prox. and mid LAD;  PCI to PLA and DES x1 to [facility]  . Type 2 diabetes mellitus treated with insulin ([place])    followed by pcp    Past Surgical History:  Procedure Laterality Date  . ANAL FISSURE REPAIR  X 2  . CATARACT EXTRACTION W/ INTRAOCULAR LENS  IMPLANT, BILATERAL Bilateral 2017;  2015  . COLONOSCOPY WITH PROPOFOL N/A 06/05/2016   Procedure: COLONOSCOPY WITH PROPOFOL;  Surgeon: Garlan Fair, MD;  [facility];  Service: Endoscopy;  Laterality: N/A;  . CORONARY ANGIOGRAPHY N/A 12/26/2016   Procedure: CORONARY ANGIOGRAPHY;  Surgeon: Troy Sine, MD;  Location: [place] [facility];  Service: Cardiovascular;  Laterality: N/A;  . CORONARY STENT INTERVENTION N/A 12/25/2016   Procedure: CORONARY STENT INTERVENTION;  Surgeon: Burnell Blanks, MD;  Location: [place] [facility];  Service:  Cardiovascular;  Laterality: N/A;  . CORONARY STENT INTERVENTION N/A 12/26/2016   Procedure: CORONARY STENT INTERVENTION;  Surgeon: Troy Sine, MD;  Location: [place] [facility];  Service: Cardiovascular;  Laterality: N/A;  . CYSTOSCOPY/URETEROSCOPY/HOLMIUM LASER/STENT PLACEMENT Right 01/07/2018   Procedure: RIGHT URETEROSCOPY/HOLMIUM LASER/STENT PLACEMENT;  Surgeon: Lucas Mallow, MD;  Location: [facility];  Service: Urology;  Laterality: Right;  . ESOPHAGOGASTRODUODENOSCOPY (EGD) WITH PROPOFOL N/A 01/12/2017   Procedure: ESOPHAGOGASTRODUODENOSCOPY (EGD) WITH PROPOFOL;  Surgeon: Wilford Corner, MD;  Location: [place];  Service: Endoscopy;   Laterality: N/A;  . HAND TENDON SURGERY Right 10-29-2002   dr Burney Gauze @[facility]    right index and thumb  . KNEE ARTHROSCOPY Bilateral 2009-;2010   @[facility]   . LEFT HEART CATH AND CORONARY ANGIOGRAPHY N/A 12/25/2016   Procedure: LEFT HEART CATH AND CORONARY ANGIOGRAPHY;  Surgeon: Burnell Blanks, MD;  Location: [place] [facility];  Service: Cardiovascular;  Laterality: N/A;  . LUMBAR LAMINECTOMY/DECOMPRESSION MICRODISCECTOMY N/A 06/09/2015   Procedure: Laminectomy - T12-L1;  Surgeon: Eustace Moore, MD;  Location: [place] [facility];  Service: Neurosurgery;  Laterality: N/A;  Laminectomy - T12-L1  . MEDIAL PARTIAL KNEE REPLACEMENT Bilateral 2009-2010    [facility]   . MELANOMA EXCISION Right    "side of my nose"  . SHOULDER SURGERY Left 1985  . TOTAL SHOULDER ARTHROPLASTY Left 12/06/2012   Procedure: LEFT TOTAL SHOULDER ARTHROPLASTY VERSES A REVERSE TOTAL SHOULDER ARTHROPLASTY;  Surgeon: Augustin Schooling, MD;  Location: [facility];  Service: Orthopedics;  Laterality: Left;    Current Outpatient Medications  Medication Sig Dispense Refill  . acetaminophen (TYLENOL) 325 MG tablet Take 2 tablets (650 mg total) by mouth every 6 (six) hours as needed for mild pain or headache.    Marland Kitchen amLODipine (NORVASC) 5 MG tablet Take 5 mg by mouth every morning.     . clopidogrel (PLAVIX) 75 MG tablet Take 1 tablet (75 mg total) by mouth daily. 90 tablet 3  . ezetimibe (ZETIA) 10 MG tablet Take 1 tablet (10 mg total) by mouth daily. 30 tablet 11  . FERREX 150 150 MG capsule Take 1 capsule by mouth daily.    Marland Kitchen HUMALOG MIX 75/25 KWIKPEN (75-25) 100 UNIT/ML Kwikpen Inject 24 Units into the skin 2 (two) times daily.   3  . isosorbide mononitrate (IMDUR) 60 MG 24 hr tablet Take 1 tablet (60 mg total) by mouth daily. 90 tablet 3  . metoprolol tartrate (LOPRESSOR) 25 MG tablet TAKE 1 TABLET BY MOUTH TWICE A DAY 180 tablet 1  . nitroGLYCERIN (NITROSTAT) 0.4 MG SL tablet Place 1 tablet (0.4 mg total) under the tongue every 5 (five)  minutes as needed for chest pain. 30 tablet 12  . Omega-3 Fatty Acids (FISH OIL) 1000 MG CAPS Take 1,000 mg by mouth daily.    . pantoprazole (PROTONIX) 40 MG tablet Take 40 mg by mouth daily.    Marland Kitchen tiZANidine (ZANAFLEX) 4 MG tablet Take 4 mg by mouth 3 (three) times daily as needed.  3   No current facility-administered medications for this visit.    Allergies  Allergen Reactions  . Statins Other (See Comments)    Severe stomach pain.  . Oxycodone Itching  . Hydrocodone Other (See Comments)    "messes with my hearing"  . Codeine Itching  . Morphine And Related Itching    Social History   Socioeconomic History  . Marital status: Married    Spouse name: Not on file  . Number  of children: Not on file  . Years of education: Not on file  . Highest education level: Not on file  Occupational History  . Not on file  Tobacco Use  . Smoking status: Never Smoker  . Smokeless tobacco: Never Used  Substance and Sexual Activity  . Alcohol use: No  . Drug use: No  . Sexual activity: Yes  Other Topics Concern  . Not on file  Social History Narrative  . Not on file   Social Determinants of Health   Financial Resource Strain:   . Difficulty of Paying Living Expenses:   Food Insecurity:   . Worried About Charity fundraiser in the Last Year:   . Arboriculturist in the Last Year:   Transportation Needs:   . Film/video editor (Medical):   Marland Kitchen Lack of Transportation (Non-Medical):   Physical Activity:   . Days of Exercise per Week:   . Minutes of Exercise per Session:   Stress:   . Feeling of Stress :   Social Connections:   . Frequency of Communication with Friends and Family:   . Frequency of Social Gatherings with Friends and Family:   . Attends Religious Services:   . Active Member of Clubs or Organizations:   . Attends Archivist Meetings:   Marland Kitchen Marital Status:   Intimate Partner Violence:   . Fear of Current or Ex-Partner:   . Emotionally Abused:   Marland Kitchen  Physically Abused:   . Sexually Abused:     Family History  Problem Relation Age of Onset  . CAD Father   . CAD Sister     Review of Systems:  As stated in the HPI and otherwise negative.   BP 134/64   Pulse (!) 56   Ht 5\' 9"  (1.753 m)   Wt 209 lb (94.8 kg)   SpO2 97%   BMI 30.86 kg/m   Physical Examination:  General: Well developed, well nourished, NAD  HEENT: OP clear, mucus membranes moist  SKIN: warm, dry. No rashes. Neuro: No focal deficits  Musculoskeletal: Muscle strength 5/5 all ext  Psychiatric: Mood and affect normal  Neck: No JVD, no carotid bruits, no thyromegaly, no lymphadenopathy.  Lungs:Clear bilaterally, no wheezes, rhonci, crackles Cardiovascular: Regular rate and rhythm. No murmurs, gallops or rubs. Abdomen:Soft. Bowel sounds present. Non-tender.  Extremities: No lower extremity edema. Pulses are 2 + in the bilateral DP/PT.  Echo 12/26/16: - Left ventricle: The cavity size was normal. Wall thickness was   normal. Systolic function was normal. The estimated ejection   fraction was in the range of 60% to 65%. Wall motion was normal;   there were no regional wall motion abnormalities. Left   ventricular diastolic function parameters were normal. - Mitral valve: Calcified annulus. - Atrial septum: No defect or patent foramen ovale was identified. - Pulmonary arteries: PA peak pressure: 36 mm Hg (S).  EKG:  EKG is ordered today. The ekg ordered today demonstrates Sinus, RBBB-chronic  Recent Labs: No results found for requested labs within last 8760 hours.   Lipid Panel    Component Value Date/Time   CHOL 143 01/22/2018 1257   TRIG 213 (H) 01/22/2018 1257   HDL 32 (L) 01/22/2018 1257   CHOLHDL 4.5 01/22/2018 1257   CHOLHDL 4.4 12/24/2016 0634   VLDL 13 12/24/2016 0634   LDLCALC 68 01/22/2018 1257     Wt Readings from Last 3 Encounters:  06/25/19 209 lb (94.8 kg)  11/11/18 210 lb (  95.3 kg)  04/15/18 201 lb (91.2 kg)     Other studies  Reviewed: Additional studies/ records that were reviewed today include: . Review of the above records demonstrates:    Assessment and Plan:   1. CAD with angina: His chest pain is stable on Imdur. Drug eluting stents placed in the LAD and RCA in November 2018. He has normal LV systolic function. Statin stopped due to abdominal pain. Continue Zetia, Plavix, Imdur and beta blocker.   2. Hyperlipidemia: He does not tolerate statins. LDL is 122 in October 2021. Goal LDL is 70. He does not wish to consider Praluent or Repatha at this time. Continue Zetia. I will route this to primary care.    Current medicines are reviewed at length with the patient today.  The patient does not have concerns regarding medicines.  The following changes have been made:  no change  Labs/ tests ordered today include:   Orders Placed This Encounter  Procedures  . EKG 12-Lead     Disposition:   FU with me in 12 months   Signed, Lauree Chandler, MD 06/25/2019 12:42 PM    [facility] [place], [place], [place]  [address] Phone: [phone]; Fax: [phone]

## 2019-07-14 ENCOUNTER — Encounter: Payer: Self-pay | Admitting: Pharmacist

## 2019-07-14 ENCOUNTER — Telehealth: Payer: Self-pay | Admitting: Cardiovascular Disease

## 2019-07-14 MED ORDER — REPATHA SURECLICK 140 MG/ML ~~LOC~~ SOAJ: 1.0000 "pen " | SUBCUTANEOUS | 11 refills | Status: DC

## 2019-07-14 NOTE — Telephone Encounter (Signed)
This encounter was created in error - please disregard.

## 2019-07-14 NOTE — Telephone Encounter (Signed)
I have faxed RX over to the [place]

## 2019-07-14 NOTE — Telephone Encounter (Signed)
Pt c/o medication issue:  1. Name of Medication: repatha  2. How are you currently taking this medication (dosage and times per day)? Has not started medication  3. Are you having a reaction (difficulty breathing--STAT)? no  4. What is your medication issue? [facility] told [facility] to have [facility] to them and they may cover it for him. Fax [phone] attention Dr. Jonnie Kind

## 2019-07-15 NOTE — Telephone Encounter (Signed)
Patient made aware that rx was faxed with attention to Dr. Jonnie Kind his PCP

## 2019-08-01 ENCOUNTER — Telehealth: Payer: Self-pay

## 2019-08-01 NOTE — Telephone Encounter (Signed)
   Primary Cardiologist: Lauree Chandler, MD  Chart reviewed as part of pre-operative protocol coverage. Patient was contacted 08/01/2019 in reference to pre-operative risk assessment for pending surgery as outlined below.  Matthew [name] was last seen on 06/25/2019 by Dr. Angelena Form.  Since that day, Matthew [name] has done well without significant chest pain or shortness of breath. Last PCI was in 12/2016. Myoview obtained in March 2020 was low risk.   Therefore, based on ACC/AHA guidelines, the patient would be at acceptable risk for the planned procedure without further cardiovascular testing.   Dr. Angelena Form, would you be ok for him to hold plavix for 5-7 days prior to the surgery and restart as soon as possible after the surgery?  Please forward your response to P CV [place], PA 08/01/2019, 6:05 PM

## 2019-08-01 NOTE — Telephone Encounter (Signed)
   [facility] Pre-operative Risk Assessment    HEARTCARE STAFF: - Please ensure there is not already an duplicate clearance open for this procedure. - Under Visit Info/Reason for Call, type in Other and utilize the format Clearance MM/DD/YY or Clearance TBD. Do not use dashes or single digits. - If request is for dental extraction, please clarify the # of teeth to be extracted.  Request for surgical clearance:  1. What type of surgery is being performed? Revision right knee uni to total knee arthroplasty    2. When is this surgery scheduled? 10/01/2019  3. What type of clearance is required (medical clearance vs. Pharmacy clearance to hold med vs. Both)? Both   4. Are there any medications that need to be held prior to surgery and how long? Plavix    5. Practice name and name of physician performing surgery? [facility], Dr. Pilar Plate Aluisio   6. What is the office phone number? [phone]    7.   What is the office fax number? [phone] Attn: [name]   8.   Anesthesia type (None, local, MAC, general) ? Choice    Mendel Ryder 08/01/2019, 11:38 AM  _________________________________________________________________   (provider comments below)

## 2019-08-04 NOTE — Telephone Encounter (Signed)
   Primary Cardiologist:Christopher Angelena Form, MD  Chart reviewed as part of pre-operative protocol coverage. Pre-op clearance already addressed by colleagues in earlier phone notes. To summarize recommendations:  - Almyra Deforest, PA-C spoke with the patient and has cleared him for procedure without further cardiovascular testing.  - Per Dr .Angelena Form, "OK to hold Plavix 5-7 days pre op."  Will route this bundled recommendation to requesting provider via Epic fax function and remove from pre-op pool. Please call with questions.  Charlie Pitter, PA-C 08/04/2019, 9:46 AM

## 2019-08-04 NOTE — Telephone Encounter (Signed)
OK to hold Plavix 5-7 days pre op.   Matthew [name]

## 2019-08-28 MED ORDER — REPATHA SURECLICK 140 MG/ML ~~LOC~~ SOAJ: 1.0000 "pen " | SUBCUTANEOUS | 11 refills | Status: DC

## 2019-08-28 NOTE — Addendum Note (Signed)
Addended by: Marcelle Overlie D on: 08/28/2019 01:11 PM   Modules accepted: Orders

## 2019-08-28 NOTE — Telephone Encounter (Signed)
RX and last OV note faxed to [facility] per pt request. Also electronically sent to patient per his request.

## 2019-08-28 NOTE — Telephone Encounter (Signed)
*  STAT* If patient is at the pharmacy, call can be transferred to refill team.   1. Which medications need to be refilled? (please list name of each medication and dose if known) Evolocumab (REPATHA SURECLICK) 446 MG/ML SOAJ  2. Which pharmacy/location (including street and city if local pharmacy) is medication to be sent to? [facility] - fax: [phone] (ATTN: Dr. Jonnie Kind)  3. Do they need a 30 day or 90 day supply? 90 day supply

## 2019-09-16 LAB — HEMOGLOBIN A1C: Hemoglobin A1C: 10.2

## 2019-09-23 ENCOUNTER — Other Ambulatory Visit (HOSPITAL_COMMUNITY): Payer: Medicare Other

## 2019-10-01 ENCOUNTER — Ambulatory Visit: Admit: 2019-10-01 | Payer: Medicare Other | Admitting: Orthopedic Surgery

## 2019-10-01 SURGERY — TOTAL KNEE REVISION
Anesthesia: Choice | Site: Knee | Laterality: Right

## 2019-10-07 ENCOUNTER — Telehealth: Payer: Self-pay | Admitting: Cardiovascular Disease

## 2019-10-07 ENCOUNTER — Ambulatory Visit: Payer: Medicare Other | Admitting: Cardiology

## 2019-10-07 NOTE — Telephone Encounter (Signed)
Called Patient back to let him know we received message from his wife. Patient complained of chest pressure in the center of his chest that happen yesterday while driving. Patient took two nitro yesterday and his symptoms improved. Patient stated it was not like it was when he first had his heart cath. Patient denies SOB now and stated he was SOB the last time he had his heart cath. Made patient an appointment to see Truitt Merle NP tomorrow to be evaluated. Encouraged patient to go to ED if his symptoms come back and are not relieved with nitro. Patient verbalized understanding and will fill out DPR for his wife when he comes in tomorrow.

## 2019-10-07 NOTE — Telephone Encounter (Signed)
Thank you :)

## 2019-10-07 NOTE — H&P (View-Only) (Signed)
CARDIOLOGY OFFICE NOTE  Date:  10/08/2019    Gilda Crease Date of Birth: [birth year] Medical Record #[id number]  PCP:  Wenda Low, MD  Cardiologist:  [facility]  Chief Complaint  Patient presents with  . Chest Pain    History of Present Illness: Matthew [name] is a 79 y.o. male who presents today for a work in visit. Seen for Dr. Angelena Form.   He has a history of DM, HTN, HLD with statin intolerance and known CAD.   He had NSTEMI in  November 2018 with cardiac cath showing severe stenosis in the mid LAD and mid RCA treated with 3 drug eluting stents in LAD and one drug eluting stent in the mid RCA. The posterolateral branch had a severe stenosis treated with balloon angioplasty. Echo  with normal LV systolic function. He was placed on ASA and Brilinta but admitted December 2018 with melena and chest pain. EGD showed a non bleeding ulcer and gastritis. ASA was stopped and Brilinta was continued. He was changed to Plavix.  Nuclear stress test 04/15/18 with no ischemia noted.   Last seen here by Dr. Angelena Form in May - felt to be doing well.   Phone call yesterday - "Called Patient back to let him know we received message from his wife. Patient complained of chest pressure in the center of his chest that happen yesterday while driving. Patient took two nitro yesterday and his symptoms improved. Patient stated it was not like it was when he first had his heart cath. Patient denies SOB now and stated he was SOB the last time he had his heart cath. Made patient an appointment to see Truitt Merle NP tomorrow to be evaluated. Encouraged patient to go to ED if his symptoms come back and are not relieved with nitro. Patient verbalized understanding and will fill out DPR for his wife when he comes in tomorrow."  Noted he was to have knee surgery - looks as if this was cancelled.   Thus added to my schedule for today.   Comes in today. Here alone. He had been to the beach - was driving  back on Monday - got to [place] - had chest pain - similar to his prior chest pain syndrome except did not have shortness of breath as he has with his prior MIs - pulled over - used NTG x 1 with not much relief - took 2nd NTG 15 minutes later and felt better - wife drove home - where he then laid down and rested. Still with some mid sternal chest pressure noted that has persisted since Monday - would rate about a "2" on scale of 0 to 10. Notes "much worse on Monday".  Had had a ham biscuit that morning. His knee surgery was cancelled due to uncontrolled DM. He has lost a few pounds because of this. He is limited by his knee pain. He has been vaccinated for COVID 19. He notes several times that this sensation is identical to prior chest pain syndrome. He had a Myoview in March of 2020. He has known residual CAD. He is on chronic nitrate therapy.   Past Medical History:  Diagnosis Date  . Anticoagulated    plavix  . Arthritis    Shoulder, knees, back   . CAD in native artery cardiologist-  dr Angelena Form   a. CAD/NSTEMI ,  cardiac cath staged stenting-- 12-25-2016  PTCA and DES x3 to prox. and mid LAD;  12-26-2016  PCI to PLA  and DES x1 to midRCA,  EF 60-65%.  . Chronic lower back pain   . CKD (chronic kidney disease), stage III   . Complication of anesthesia    "Too much with shoulder surgery", pt. reports that he was told the at they "lost him, due to absorbing too much anesthesia".  shoulder surgery 1985  . DDD (degenerative disc disease), lumbosacral   . GERD (gastroesophageal reflux disease)   . History of gastric ulcer 01/2017  . History of kidney stones   . History of malignant melanoma    right side of nose  . History of non-ST elevation myocardial infarction (NSTEMI) 12/23/2017   s/p  staged cardiac cath,  s/p  PCI and DEStenting  . Hyperlipidemia   . Hypertension   . IDA (iron deficiency anemia)   . Nocturia   . RBBB   . Renal calculus, right   . S/P drug eluting coronary stent  placement 12-25-2017,  12-26-2017   PTCA and DES x3 to prox. and mid LAD;  PCI to PLA and DES x1 to [facility]  . Type 2 diabetes mellitus treated with insulin ([place])    followed by pcp    Past Surgical History:  Procedure Laterality Date  . ANAL FISSURE REPAIR  X 2  . CATARACT EXTRACTION W/ INTRAOCULAR LENS  IMPLANT, BILATERAL Bilateral 2017;  2015  . COLONOSCOPY WITH PROPOFOL N/A 06/05/2016   Procedure: COLONOSCOPY WITH PROPOFOL;  Surgeon: Garlan Fair, MD;  Location: [facility];  Service: Endoscopy;  Laterality: N/A;  . CORONARY ANGIOGRAPHY N/A 12/26/2016   Procedure: CORONARY ANGIOGRAPHY;  Surgeon: Troy Sine, MD;  Location: [place] [facility];  Service: Cardiovascular;  Laterality: N/A;  . CORONARY STENT INTERVENTION N/A 12/25/2016   Procedure: CORONARY STENT INTERVENTION;  Surgeon: Burnell Blanks, MD;  Location: [place] [facility];  Service: Cardiovascular;  Laterality: N/A;  . CORONARY STENT INTERVENTION N/A 12/26/2016   Procedure: CORONARY STENT INTERVENTION;  Surgeon: Troy Sine, MD;  Location: [place] [facility];  Service: Cardiovascular;  Laterality: N/A;  . CYSTOSCOPY/URETEROSCOPY/HOLMIUM LASER/STENT PLACEMENT Right 01/07/2018   Procedure: RIGHT URETEROSCOPY/HOLMIUM LASER/STENT PLACEMENT;  Surgeon: Lucas Mallow, MD;  Location: [facility];  Service: Urology;  Laterality: Right;  . ESOPHAGOGASTRODUODENOSCOPY (EGD) WITH PROPOFOL N/A 01/12/2017   Procedure: ESOPHAGOGASTRODUODENOSCOPY (EGD) WITH PROPOFOL;  Surgeon: Wilford Corner, MD;  Location: [place];  Service: Endoscopy;  Laterality: N/A;  . HAND TENDON SURGERY Right 10-29-2002   dr Burney Gauze @[facility]    right index and thumb  . KNEE ARTHROSCOPY Bilateral 2009-;2010   @[facility]   . LEFT HEART CATH AND CORONARY ANGIOGRAPHY N/A 12/25/2016   Procedure: LEFT HEART CATH AND CORONARY ANGIOGRAPHY;  Surgeon: Burnell Blanks, MD;  Location: [place] [facility];  Service: Cardiovascular;   Laterality: N/A;  . LUMBAR LAMINECTOMY/DECOMPRESSION MICRODISCECTOMY N/A 06/09/2015   Procedure: Laminectomy - T12-L1;  Surgeon: Eustace Moore, MD;  Location: [place] [facility];  Service: Neurosurgery;  Laterality: N/A;  Laminectomy - T12-L1  . MEDIAL PARTIAL KNEE REPLACEMENT Bilateral 2009-2010    [facility]   . MELANOMA EXCISION Right    "side of my nose"  . SHOULDER SURGERY Left 1985  . TOTAL SHOULDER ARTHROPLASTY Left 12/06/2012   Procedure: LEFT TOTAL SHOULDER ARTHROPLASTY VERSES A REVERSE TOTAL SHOULDER ARTHROPLASTY;  Surgeon: Augustin Schooling, MD;  Location: [place];  Service: Orthopedics;  Laterality: Left;     Medications: Current Meds  Medication Sig  . acetaminophen (TYLENOL) 325 MG tablet Take 2  tablets (650 mg total) by mouth every 6 (six) hours as needed for mild pain or headache.  . Alogliptin Benzoate 12.5 MG TABS Take 12.5 mg by mouth daily.  Marland Kitchen amLODipine (NORVASC) 5 MG tablet Take 5 mg by mouth every morning.   . clopidogrel (PLAVIX) 75 MG tablet Take 1 tablet (75 mg total) by mouth daily.  . Evolocumab (REPATHA SURECLICK) 315 MG/ML SOAJ Inject 1 pen into the skin every 14 (fourteen) days.  Marland Kitchen ezetimibe (ZETIA) 10 MG tablet Take 1 tablet (10 mg total) by mouth daily.  Marland Kitchen FERREX 150 150 MG capsule Take 1 capsule by mouth daily.  . insulin aspart protamine - aspart (NOVOLOG 70/30 FLEXPEN RELION) (70-30) 100 UNIT/ML FlexPen Inject into the skin 2 (two) times daily.  . isosorbide mononitrate (IMDUR) 60 MG 24 hr tablet Take 1.5 tablets (90 mg total) by mouth daily.  . metoprolol tartrate (LOPRESSOR) 25 MG tablet TAKE 1 TABLET BY MOUTH TWICE A DAY  . nitroGLYCERIN (NITROSTAT) 0.4 MG SL tablet Place 1 tablet (0.4 mg total) under the tongue every 5 (five) minutes as needed for chest pain.  . Omega-3 Fatty Acids (FISH OIL) 1000 MG CAPS Take 1,000 mg by mouth daily.  . pantoprazole (PROTONIX) 40 MG tablet Take 40 mg by mouth daily.  Marland Kitchen tiZANidine (ZANAFLEX) 4 MG tablet Take 4 mg by mouth 3  (three) times daily as needed.  . Turmeric (QC TUMERIC COMPLEX) 500 MG CAPS Take 1,500 mg by mouth in the morning and at bedtime.  . Vitamin D, Cholecalciferol, 25 MCG (1000 UT) CAPS Take by mouth.  . [DISCONTINUED] isosorbide mononitrate (IMDUR) 60 MG 24 hr tablet Take 1 tablet (60 mg total) by mouth daily.     Allergies: Allergies  Allergen Reactions  . Statins Other (See Comments)    Severe stomach pain.  . Oxycodone Itching  . Hydrocodone Other (See Comments)    "messes with my hearing"  . Codeine Itching  . Morphine And Related Itching    Social History: The patient  reports that he has never smoked. He has never used smokeless tobacco. He reports that he does not drink alcohol and does not use drugs.   Family History: The patient's family history includes CAD in his father and sister.   Review of Systems: Please see the history of present illness.   All other systems are reviewed and negative.   Physical Exam: VS:  BP 132/74   Pulse 66   Ht 5\' 8"  (1.727 m)   Wt 203 lb (92.1 kg)   BMI 30.87 kg/m  .  BMI Body mass index is 30.87 kg/m.  Wt Readings from Last 3 Encounters:  10/08/19 203 lb (92.1 kg)  06/25/19 209 lb (94.8 kg)  11/11/18 210 lb (95.3 kg)    General:  Alert and in no acute distress.  His weight is down a few pounds.  HEENT: Normal.  Neck: Supple, no JVD, carotid bruits, or masses noted.  Cardiac: Regular rate and rhythm. No murmurs, rubs, or gallops. No edema.  Respiratory:  Lungs are clear to auscultation bilaterally with normal work of breathing.  GI: Soft and nontender.  MS: No deformity or atrophy. Gait and ROM intact.  Skin: Warm and dry. Color is normal.  Neuro:  Strength and sensation are intact and no gross focal deficits noted.  Psych: Alert, appropriate and with normal affect.   LABORATORY DATA:  EKG:  EKG is ordered today.  Personally reviewed by me. This demonstrates NSR with  RBBB. Unchanged from prior tracings.   Lab Results    Component Value Date   WBC 4.9 02/01/2017   HGB 8.2 (L) 01/07/2018   HCT 20.0 (L) 01/07/2018   PLT 261 02/01/2017   GLUCOSE 125 (H) 01/07/2018   CHOL 143 01/22/2018   TRIG 213 (H) 01/22/2018   HDL 32 (L) 01/22/2018   LDLCALC 68 01/22/2018   ALT 21 06/26/2017   AST 19 06/26/2017   NA 141 01/07/2018   K 4.2 01/07/2018   CL 104 02/01/2017   CREATININE 1.23 02/01/2017   BUN 14 02/01/2017   CO2 25 02/01/2017   INR 1.00 01/11/2017   HGBA1C 11.3 (H) 12/26/2016       BNP (last 3 results) No results for input(s): BNP in the last 8760 hours.  ProBNP (last 3 results) No results for input(s): PROBNP in the last 8760 hours.   Other Studies Reviewed Today:  Myoview Study Highlights 04/2018   Nuclear stress EF: 69%.  There was no ST segment deviation noted during stress.  No T wave inversion was noted during stress.  This is a low risk study.  The left ventricular ejection fraction is hyperdynamic (>65%).  Echo Study Conclusions 12/2016  - Left ventricle: The cavity size was normal. Wall thickness was  normal. Systolic function was normal. The estimated ejection  fraction was in the range of 60% to 65%. Wall motion was normal;  there were no regional wall motion abnormalities. Left  ventricular diastolic function parameters were normal.  - Mitral valve: Calcified annulus.  - Atrial septum: No defect or patent foramen ovale was identified.  - Pulmonary arteries: PA peak pressure: 36 mm Hg (S).    CORONARY ANGIOGRAPHY 12/2016  CORONARY STENT INTERVENTION  Conclusion    1st Diag lesion is 99% stenosed.  Previously placed Prox LAD drug eluting stent is widely patent.  Balloon angioplasty was performed.  Ost Cx to Mid Cx lesion is 20% stenosed.  Prox RCA lesion is 85% stenosed.  Ost RPDA to RPDA lesion is 60% stenosed.  Post Atrio lesion is 99% stenosed.  Post intervention, there is a 10% residual stenosis.  Post intervention, there is a 0%  residual stenosis.  A stent was successfully placed.  Previously placed Mid LAD-1 drug eluting stent is widely patent.  Balloon angioplasty was performed.  Mid LAD-2 lesion is 50% stenosed.   Relook of the LAD reveals 3 widely patent tandem stents without restenosis.  There is 50% narrowing in the LAD in the mid segment beyond the distal stent.  The RCA had previously noted 85% eccentric stenosis on the bend in the mid RCA, there is diffuse 50-60% PDA stenoses and 99% PLA stenosis.  Following successful PCI the PLA 99% stenosis treated with PTCA was reduced to less than 10% and a 85% eccentric mid RCA stenosis was reduced to 0% with insertion of a 2.515 mm Xience Sierra DES stent postdilated to 2.75 mm.  RECOMMENDATION: The patient should continue with the DAPT for minimum of one-year.  Medical therapy for concomitant CAD and aggressive lipid-lowering strategy.    Assessment and Plan:   1. Chest pain/unstable angina - events of this week concerning - still with some ongoing discomfort. Checking stat Troponin with BMET and CBC today. Have advised admission - he has declined due to the surge of COVID 19 and long ER wait times. He wishes to proceed as outpatient for cardiac catheterization and understands that this is not my preferred plan of treatment in light  of ongoing symptoms. Increasing Imdur to 90 mg a day - to take an extra half when he gets home and then 90 mg a day. COVID testing in AM. Cardiac cath on Friday at 7:30Am with Dr. Claiborne Billings. The patient understands that risks include but are not limited to stroke (1 in 1000), death (1 in 36), kidney failure [usually temporary] (1 in 500), bleeding (1 in 200), allergic reaction [possibly serious] (1 in 200), and agrees to proceed. He will need to use half dose Insulin the night prior and hold on Friday Am. Continue DAPT with aspirin and plavix. If he has worsening symptoms or Troponin is + - he needs to proceed on to the ER. He verbalized  understanding.   2. HLD - on PCKS9 therapy.   3. HTN - BP is ok - would continue Norvasc and Metoprolol.   4. DM - uncontrolled - was not able to proceed with his knee surgery due to this. May need to consider SGLT2 therapy.   Current medicines are reviewed with the patient today.  The patient does not have concerns regarding medicines other than what has been noted above.  The following changes have been made:  See above.  Labs/ tests ordered today include:    Orders Placed This Encounter  Procedures  . Comprehensive metabolic panel  . CBC  . Troponin T  . EKG 12-Lead     Disposition:   FU with Dr. Angelena Form and his team for post cath visit in a few weeks.     Patient is agreeable to this plan and will call if any problems develop in the interim.   SignedTruitt Merle, NP  10/08/2019 3:55 PM  [place] [address] [place],   [address] Phone: [phone] Fax: [phone]

## 2019-10-07 NOTE — Telephone Encounter (Signed)
Pt c/o of Chest Pain: STAT if CP now or developed within 24 hours  1. Are you having CP right now? No   2. Are you experiencing any other symptoms (ex. SOB, nausea, vomiting, sweating)? No  3. How long have you been experiencing CP? Began around 11:30 AM - 12:00 PM yesterday, lasted about 2 hours  4. Is your CP continuous or coming and going? Continuous - Patient's wife states the pain lasted a few hours then went away after taking nitroglycerin  5. Have you taken Nitroglycerin? Patient took 2 nitroglycerin yesterday ?

## 2019-10-07 NOTE — Progress Notes (Signed)
CARDIOLOGY OFFICE NOTE  Date:  10/08/2019    Matthew [name] Date of Birth: July 24, 1940 Medical Record #[id number]  PCP:  Wenda Low, MD  Cardiologist:  [facility]  Chief Complaint  Patient presents with  . Chest Pain    History of Present Illness: Matthew [name] is a 79 y.o. male who presents today for a work in visit. Seen for Dr. Angelena Form.   He has a history of DM, HTN, HLD with statin intolerance and known CAD.   He had NSTEMI in  November 2018 with cardiac cath showing severe stenosis in the mid LAD and mid RCA treated with 3 drug eluting stents in LAD and one drug eluting stent in the mid RCA. The posterolateral branch had a severe stenosis treated with balloon angioplasty. Echo  with normal LV systolic function. He was placed on ASA and Brilinta but admitted December 2018 with melena and chest pain. EGD showed a non bleeding ulcer and gastritis. ASA was stopped and Brilinta was continued. He was changed to Plavix.  Nuclear stress test 04/15/18 with no ischemia noted.   Last seen here by Dr. Angelena Form in May - felt to be doing well.   Phone call yesterday - "Called Patient back to let him know we received message from his wife. Patient complained of chest pressure in the center of his chest that happen yesterday while driving. Patient took two nitro yesterday and his symptoms improved. Patient stated it was not like it was when he first had his heart cath. Patient denies SOB now and stated he was SOB the last time he had his heart cath. Made patient an appointment to see Truitt Merle NP tomorrow to be evaluated. Encouraged patient to go to ED if his symptoms come back and are not relieved with nitro. Patient verbalized understanding and will fill out DPR for his wife when he comes in tomorrow."  Noted he was to have knee surgery - looks as if this was cancelled.   Thus added to my schedule for today.   Comes in today. Here alone. He had been to the beach - was driving  back on Monday - got to [place] - had chest pain - similar to his prior chest pain syndrome except did not have shortness of breath as he has with his prior MIs - pulled over - used NTG x 1 with not much relief - took 2nd NTG 15 minutes later and felt better - wife drove home - where he then laid down and rested. Still with some mid sternal chest pressure noted that has persisted since Monday - would rate about a "2" on scale of 0 to 10. Notes "much worse on Monday".  Had had a ham biscuit that morning. His knee surgery was cancelled due to uncontrolled DM. He has lost a few pounds because of this. He is limited by his knee pain. He has been vaccinated for COVID 19. He notes several times that this sensation is identical to prior chest pain syndrome. He had a Myoview in March of 2020. He has known residual CAD. He is on chronic nitrate therapy.   Past Medical History:  Diagnosis Date  . Anticoagulated    plavix  . Arthritis    Shoulder, knees, back   . CAD in native artery cardiologist-  dr Angelena Form   a. CAD/NSTEMI ,  cardiac cath staged stenting-- 12-25-2016  PTCA and DES x3 to prox. and mid LAD;  12-26-2016  PCI to PLA  and DES x1 to midRCA,  EF 60-65%.  . Chronic lower back pain   . CKD (chronic kidney disease), stage III   . Complication of anesthesia    "Too much with shoulder surgery", pt. reports that he was told the at they "lost him, due to absorbing too much anesthesia".  shoulder surgery 1985  . DDD (degenerative disc disease), lumbosacral   . GERD (gastroesophageal reflux disease)   . History of gastric ulcer 01/2017  . History of kidney stones   . History of malignant melanoma    right side of nose  . History of non-ST elevation myocardial infarction (NSTEMI) 12/23/2017   s/p  staged cardiac cath,  s/p  PCI and DEStenting  . Hyperlipidemia   . Hypertension   . IDA (iron deficiency anemia)   . Nocturia   . RBBB   . Renal calculus, right   . S/P drug eluting coronary stent  placement 12-25-2017,  12-26-2017   PTCA and DES x3 to prox. and mid LAD;  PCI to PLA and DES x1 to [facility]  . Type 2 diabetes mellitus treated with insulin ([place])    followed by pcp    Past Surgical History:  Procedure Laterality Date  . ANAL FISSURE REPAIR  X 2  . CATARACT EXTRACTION W/ INTRAOCULAR LENS  IMPLANT, BILATERAL Bilateral 2017;  2015  . COLONOSCOPY WITH PROPOFOL N/A 06/05/2016   Procedure: COLONOSCOPY WITH PROPOFOL;  Surgeon: Garlan Fair, MD;  Location: [facility];  Service: Endoscopy;  Laterality: N/A;  . CORONARY ANGIOGRAPHY N/A 12/26/2016   Procedure: CORONARY ANGIOGRAPHY;  Surgeon: Troy Sine, MD;  Location: [place] [facility];  Service: Cardiovascular;  Laterality: N/A;  . CORONARY STENT INTERVENTION N/A 12/25/2016   Procedure: CORONARY STENT INTERVENTION;  Surgeon: Burnell Blanks, MD;  Location: [place] CV LAB;  Service: Cardiovascular;  Laterality: N/A;  . CORONARY STENT INTERVENTION N/A 12/26/2016   Procedure: CORONARY STENT INTERVENTION;  Surgeon: Troy Sine, MD;  Location: [place] CV LAB;  Service: Cardiovascular;  Laterality: N/A;  . CYSTOSCOPY/URETEROSCOPY/HOLMIUM LASER/STENT PLACEMENT Right 01/07/2018   Procedure: RIGHT URETEROSCOPY/HOLMIUM LASER/STENT PLACEMENT;  Surgeon: Lucas Mallow, MD;  [facility];  Service: Urology;  Laterality: Right;  . ESOPHAGOGASTRODUODENOSCOPY (EGD) WITH PROPOFOL N/A 01/12/2017   Procedure: ESOPHAGOGASTRODUODENOSCOPY (EGD) WITH PROPOFOL;  Surgeon: Wilford Corner, MD;  Location: [place];  Service: Endoscopy;  Laterality: N/A;  . HAND TENDON SURGERY Right 10-29-2002   dr Burney Gauze @[facility]    right index and thumb  . KNEE ARTHROSCOPY Bilateral 2009-;2010   @[facility]   . LEFT HEART CATH AND CORONARY ANGIOGRAPHY N/A 12/25/2016   Procedure: LEFT HEART CATH AND CORONARY ANGIOGRAPHY;  Surgeon: Burnell Blanks, MD;  Location: [place] CV LAB;  Service: Cardiovascular;   Laterality: N/A;  . LUMBAR LAMINECTOMY/DECOMPRESSION MICRODISCECTOMY N/A 06/09/2015   Procedure: Laminectomy - T12-L1;  Surgeon: Eustace Moore, MD;  Location: [place] [facility];  Service: Neurosurgery;  Laterality: N/A;  Laminectomy - T12-L1  . MEDIAL PARTIAL KNEE REPLACEMENT Bilateral 2009-2010    [facility]   . MELANOMA EXCISION Right    "side of my nose"  . SHOULDER SURGERY Left 1985  . TOTAL SHOULDER ARTHROPLASTY Left 12/06/2012   Procedure: LEFT TOTAL SHOULDER ARTHROPLASTY VERSES A REVERSE TOTAL SHOULDER ARTHROPLASTY;  Surgeon: Augustin Schooling, MD;  Location: [place];  Service: Orthopedics;  Laterality: Left;     Medications: Current Meds  Medication Sig  . acetaminophen (TYLENOL) 325 MG tablet Take 2  tablets (650 mg total) by mouth every 6 (six) hours as needed for mild pain or headache.  . Alogliptin Benzoate 12.5 MG TABS Take 12.5 mg by mouth daily.  Marland Kitchen amLODipine (NORVASC) 5 MG tablet Take 5 mg by mouth every morning.   . clopidogrel (PLAVIX) 75 MG tablet Take 1 tablet (75 mg total) by mouth daily.  . Evolocumab (REPATHA SURECLICK) 270 MG/ML SOAJ Inject 1 pen into the skin every 14 (fourteen) days.  Marland Kitchen ezetimibe (ZETIA) 10 MG tablet Take 1 tablet (10 mg total) by mouth daily.  Marland Kitchen FERREX 150 150 MG capsule Take 1 capsule by mouth daily.  . insulin aspart protamine - aspart (NOVOLOG 70/30 FLEXPEN RELION) (70-30) 100 UNIT/ML FlexPen Inject into the skin 2 (two) times daily.  . isosorbide mononitrate (IMDUR) 60 MG 24 hr tablet Take 1.5 tablets (90 mg total) by mouth daily.  . metoprolol tartrate (LOPRESSOR) 25 MG tablet TAKE 1 TABLET BY MOUTH TWICE A DAY  . nitroGLYCERIN (NITROSTAT) 0.4 MG SL tablet Place 1 tablet (0.4 mg total) under the tongue every 5 (five) minutes as needed for chest pain.  . Omega-3 Fatty Acids (FISH OIL) 1000 MG CAPS Take 1,000 mg by mouth daily.  . pantoprazole (PROTONIX) 40 MG tablet Take 40 mg by mouth daily.  Marland Kitchen tiZANidine (ZANAFLEX) 4 MG tablet Take 4 mg by mouth 3  (three) times daily as needed.  . Turmeric (QC TUMERIC COMPLEX) 500 MG CAPS Take 1,500 mg by mouth in the morning and at bedtime.  . Vitamin D, Cholecalciferol, 25 MCG (1000 UT) CAPS Take by mouth.  . [DISCONTINUED] isosorbide mononitrate (IMDUR) 60 MG 24 hr tablet Take 1 tablet (60 mg total) by mouth daily.     Allergies: Allergies  Allergen Reactions  . Statins Other (See Comments)    Severe stomach pain.  . Oxycodone Itching  . Hydrocodone Other (See Comments)    "messes with my hearing"  . Codeine Itching  . Morphine And Related Itching    Social History: The patient  reports that he has never smoked. He has never used smokeless tobacco. He reports that he does not drink alcohol and does not use drugs.   Family History: The patient's family history includes CAD in his father and sister.   Review of Systems: Please see the history of present illness.   All other systems are reviewed and negative.   Physical Exam: VS:  BP 132/74   Pulse 66   Ht 5\' 8"  (1.727 m)   Wt 203 lb (92.1 kg)   BMI 30.87 kg/m  .  BMI Body mass index is 30.87 kg/m.  Wt Readings from Last 3 Encounters:  10/08/19 203 lb (92.1 kg)  06/25/19 209 lb (94.8 kg)  11/11/18 210 lb (95.3 kg)    General:  Alert and in no acute distress.  His weight is down a few pounds.  HEENT: Normal.  Neck: Supple, no JVD, carotid bruits, or masses noted.  Cardiac: Regular rate and rhythm. No murmurs, rubs, or gallops. No edema.  Respiratory:  Lungs are clear to auscultation bilaterally with normal work of breathing.  GI: Soft and nontender.  MS: No deformity or atrophy. Gait and ROM intact.  Skin: Warm and dry. Color is normal.  Neuro:  Strength and sensation are intact and no gross focal deficits noted.  Psych: Alert, appropriate and with normal affect.   LABORATORY DATA:  EKG:  EKG is ordered today.  Personally reviewed by me. This demonstrates NSR with  RBBB. Unchanged from prior tracings.   Lab Results   Component Value Date   WBC 4.9 02/01/2017   HGB 8.2 (L) 01/07/2018   HCT 20.0 (L) 01/07/2018   PLT 261 02/01/2017   GLUCOSE 125 (H) 01/07/2018   CHOL 143 01/22/2018   TRIG 213 (H) 01/22/2018   HDL 32 (L) 01/22/2018   LDLCALC 68 01/22/2018   ALT 21 06/26/2017   AST 19 06/26/2017   NA 141 01/07/2018   K 4.2 01/07/2018   CL 104 02/01/2017   CREATININE 1.23 02/01/2017   BUN 14 02/01/2017   CO2 25 02/01/2017   INR 1.00 01/11/2017   HGBA1C 11.3 (H) 12/26/2016       BNP (last 3 results) No results for input(s): BNP in the last 8760 hours.  ProBNP (last 3 results) No results for input(s): PROBNP in the last 8760 hours.   Other Studies Reviewed Today:  Myoview Study Highlights 04/2018   Nuclear stress EF: 69%.  There was no ST segment deviation noted during stress.  No T wave inversion was noted during stress.  This is a low risk study.  The left ventricular ejection fraction is hyperdynamic (>65%).  Echo Study Conclusions 12/2016  - Left ventricle: The cavity size was normal. Wall thickness was  normal. Systolic function was normal. The estimated ejection  fraction was in the range of 60% to 65%. Wall motion was normal;  there were no regional wall motion abnormalities. Left  ventricular diastolic function parameters were normal.  - Mitral valve: Calcified annulus.  - Atrial septum: No defect or patent foramen ovale was identified.  - Pulmonary arteries: PA peak pressure: 36 mm Hg (S).    CORONARY ANGIOGRAPHY 12/2016  CORONARY STENT INTERVENTION  Conclusion    1st Diag lesion is 99% stenosed.  Previously placed Prox LAD drug eluting stent is widely patent.  Balloon angioplasty was performed.  Ost Cx to Mid Cx lesion is 20% stenosed.  Prox RCA lesion is 85% stenosed.  Ost RPDA to RPDA lesion is 60% stenosed.  Post Atrio lesion is 99% stenosed.  Post intervention, there is a 10% residual stenosis.  Post intervention, there is a 0%  residual stenosis.  A stent was successfully placed.  Previously placed Mid LAD-1 drug eluting stent is widely patent.  Balloon angioplasty was performed.  Mid LAD-2 lesion is 50% stenosed.   Relook of the LAD reveals 3 widely patent tandem stents without restenosis.  There is 50% narrowing in the LAD in the mid segment beyond the distal stent.  The RCA had previously noted 85% eccentric stenosis on the bend in the mid RCA, there is diffuse 50-60% PDA stenoses and 99% PLA stenosis.  Following successful PCI the PLA 99% stenosis treated with PTCA was reduced to less than 10% and a 85% eccentric mid RCA stenosis was reduced to 0% with insertion of a 2.515 mm Xience Sierra DES stent postdilated to 2.75 mm.  RECOMMENDATION: The patient should continue with the DAPT for minimum of one-year.  Medical therapy for concomitant CAD and aggressive lipid-lowering strategy.    Assessment and Plan:   1. Chest pain/unstable angina - events of this week concerning - still with some ongoing discomfort. Checking stat Troponin with BMET and CBC today. Have advised admission - he has declined due to the surge of COVID 19 and long ER wait times. He wishes to proceed as outpatient for cardiac catheterization and understands that this is not my preferred plan of treatment in light of  ongoing symptoms. Increasing Imdur to 90 mg a day - to take an extra half when he gets home and then 90 mg a day. COVID testing in AM. Cardiac cath on Friday at 7:30Am with Dr. Claiborne Billings. The patient understands that risks include but are not limited to stroke (1 in 1000), death (1 in 66), kidney failure [usually temporary] (1 in 500), bleeding (1 in 200), allergic reaction [possibly serious] (1 in 200), and agrees to proceed. He will need to use half dose Insulin the night prior and hold on Friday Am. Continue DAPT with aspirin and plavix. If he has worsening symptoms or Troponin is + - he needs to proceed on to the ER. He verbalized  understanding.   2. HLD - on PCKS9 therapy.   3. HTN - BP is ok - would continue Norvasc and Metoprolol.   4. DM - uncontrolled - was not able to proceed with his knee surgery due to this. May need to consider SGLT2 therapy.   Current medicines are reviewed with the patient today.  The patient does not have concerns regarding medicines other than what has been noted above.  The following changes have been made:  See above.  Labs/ tests ordered today include:    Orders Placed This Encounter  Procedures  . Comprehensive metabolic panel  . CBC  . Troponin T  . EKG 12-Lead     Disposition:   FU with Dr. Angelena Form and his team for post cath visit in a few weeks.     Patient is agreeable to this plan and will call if any problems develop in the interim.   SignedTruitt Merle, NP  10/08/2019 3:55 PM  [facility] [address] [place],   [address] Phone: [phone] Fax: [phone]

## 2019-10-08 ENCOUNTER — Ambulatory Visit: Payer: Medicare Other | Admitting: Nurse Practitioner

## 2019-10-08 ENCOUNTER — Encounter: Payer: Self-pay | Admitting: Nurse Practitioner

## 2019-10-08 ENCOUNTER — Other Ambulatory Visit: Payer: Self-pay

## 2019-10-08 VITALS — BP 132/74 | HR 66 | Ht 68.0 in | Wt 203.0 lb

## 2019-10-08 DIAGNOSIS — E785 Hyperlipidemia, unspecified: Secondary | ICD-10-CM

## 2019-10-08 DIAGNOSIS — I10 Essential (primary) hypertension: Secondary | ICD-10-CM

## 2019-10-08 DIAGNOSIS — I25118 Atherosclerotic heart disease of native coronary artery with other forms of angina pectoris: Secondary | ICD-10-CM | POA: Diagnosis not present

## 2019-10-08 DIAGNOSIS — I2510 Atherosclerotic heart disease of native coronary artery without angina pectoris: Secondary | ICD-10-CM

## 2019-10-08 DIAGNOSIS — R079 Chest pain, unspecified: Secondary | ICD-10-CM

## 2019-10-08 LAB — COMPREHENSIVE METABOLIC PANEL
ALT: 32 IU/L (ref 0–44)
AST: 25 IU/L (ref 0–40)
Albumin/Globulin Ratio: 2.1 (ref 1.2–2.2)
Albumin: 4.5 g/dL (ref 3.7–4.7)
Alkaline Phosphatase: 93 IU/L (ref 48–121)
BUN/Creatinine Ratio: 16 (ref 10–24)
BUN: 19 mg/dL (ref 8–27)
Bilirubin Total: 0.6 mg/dL (ref 0.0–1.2)
CO2: 27 mmol/L (ref 20–29)
Calcium: 10.1 mg/dL (ref 8.6–10.2)
Chloride: 105 mmol/L (ref 96–106)
Creatinine, Ser: 1.17 mg/dL (ref 0.76–1.27)
GFR calc Af Amer: 69 mL/min/{1.73_m2} (ref >59)
GFR calc non Af Amer: 60 mL/min/{1.73_m2} (ref >59)
Globulin, Total: 2.1 g/dL (ref 1.5–4.5)
Glucose: 173 mg/dL — ABNORMAL HIGH (ref 65–99)
Potassium: 4.5 mmol/L (ref 3.5–5.2)
Sodium: 141 mmol/L (ref 134–144)
Total Protein: 6.6 g/dL (ref 6.0–8.5)

## 2019-10-08 LAB — CBC
Hematocrit: 41.2 % (ref 37.5–51.0)
Hemoglobin: 13.8 g/dL (ref 13.0–17.7)
MCH: 30.1 pg (ref 26.6–33.0)
MCHC: 33.5 g/dL (ref 31.5–35.7)
MCV: 90 fL (ref 79–97)
Platelets: 216 10*3/uL (ref 150–450)
RBC: 4.59 x10E6/uL (ref 4.14–5.80)
RDW: 13.4 % (ref 11.6–15.4)
WBC: 5.6 10*3/uL (ref 3.4–10.8)

## 2019-10-08 LAB — TROPONIN T: Troponin T (Highly Sensitive): 17 ng/L (ref 0–22)

## 2019-10-08 MED ORDER — ISOSORBIDE MONONITRATE ER 60 MG PO TB24: 90.0000 mg | ORAL_TABLET | Freq: Every day | ORAL | 3 refills | Status: DC

## 2019-10-08 NOTE — Patient Instructions (Addendum)
After Visit Summary:  We will be checking the following labs today - STAT troponin, BMET and CBC   Medication Instructions:    Continue with your current medicines. BUT  I am increasing the Imdur to 90 mg a day - this will be a pill and a half - take the extra half when you get home and then the one and a half daily starting tomorrow.    If you need a refill on your cardiac medications before your next appointment, please call your pharmacy.     Testing/Procedures To Be Arranged:  Cardiac catheterization on Friday  Follow-Up:   See Dr. McAlhany/team in 2 to 3 weeks post cath    At [facility], you and your health needs are our priority.  As part of our continuing mission to provide you with exceptional heart care, we have created designated Provider Care Teams.  These Care Teams include your primary Cardiologist (physician) and Advanced Practice Providers (APPs -  Physician Assistants and Nurse Practitioners) who all work together to provide you with the care you need, when you need it.  Special Instructions:  . Stay safe, wash your hands for at least 20 seconds and wear a mask when needed.  . It was good to talk with you today.    Your provider has recommended a cardiac catherization.   You will need a COVID test prior to your procedure - Go to Pre-Procedural COVID -19 testing at [address] in [place], [place] [address] on _____________________________________.   You are scheduled for a cardiac catheterization on Friday, September 3rd at 7:30AM with Dr. Claiborne Billings or associate.  Please arrive at the [facility] (Main Entrance) at [facility] at [address] [place] Stay on Friday, September 3rd at 5:30 AM.  Free valet parking is available.   You are allowed only one visitor in the hospital with you. Both you and your visitor must wear masks.    Special note: Every effort is made to have your procedure done on time.   Please understand  that emergencies sometimes delay a scheduled   procedure.  No food or drink after midnight on Thursday.  On the morning of your procedure, take your aspirin and Plavix.   You may take your morning medications with a sip of water on the day of your procedure.  Please take a baby aspirin (81 mg) on the morning of your procedure.   Hold AM dose of Insulin on Friday - only take 1/2 dose of insulin on Thursday night  Plan for a one night stay -- bring personal belongings.  Bring a current list of your medications and current insurance cards.  You MUST have a responsible person to drive you home. Someone MUST be with you the first 24 hours after you arrive home or your discharge will be delayed. Wear clothes that are easy to get on and off and wear slip on shoes.    Coronary Angiogram A coronary angiogram, also called coronary angiography, is an X-ray procedure used to look at the arteries in the heart. In this procedure, a dye (contrast dye) is injected through a long, hollow tube (catheter). The catheter is about the size of a piece of cooked spaghetti and is inserted through your groin, wrist, or arm. The dye is injected into each artery, and X-rays are then taken to show if there is a blockage in the arteries of your heart.  [place]  PROVIDER KNOW ABOUT:  Any allergies you have, including allergies to shellfish or contrast dye.    All medicines you are taking, including vitamins, herbs, eye drops, creams, and over-the-counter medicines.    Previous problems you or members of your family have had with the use of anesthetics.    Any blood disorders you have.    Previous surgeries you have had.  History of kidney problems or failure.    Other medical conditions you have.  RISKS AND COMPLICATIONS  Generally, a coronary angiogram is a safe procedure. However, about 1 person out of 1000 can have problems that may include:  Allergic reaction to the dye.  Bleeding/bruising  from the access site or other locations.  Kidney injury, especially in people with impaired kidney function.   Stroke (rare).  Heart attack (rare).  Irregular rhythms (rare)  Death (rare)  BEFORE THE PROCEDURE   Do not eat or drink anything after midnight the night before the procedure or as directed by your health care provider.    Ask your health care provider about changing or stopping your regular medicines. This is especially important if you are taking diabetes medicines or blood thinners.  PROCEDURE  You may be given a medicine to help you relax (sedative) before the procedure. This medicine is given through an intravenous (IV) access tube that is inserted into one of your veins.    The area where the catheter will be inserted will be washed and shaved. This is usually done in the groin but may be done in the fold of your arm (near your elbow) or in the wrist.     A medicine will be given to numb the area where the catheter will be inserted (local anesthetic).    The health care provider will insert the catheter into an artery. The catheter will be guided by using a special type of X-ray (fluoroscopy) of the blood vessel being examined.    A special dye will then be injected into the catheter, and X-rays will be taken. The dye will help to show where any narrowing or blockages are located in the heart arteries.     AFTER THE PROCEDURE   If the procedure is done through the leg, you will be kept in bed lying flat for several hours. You will be instructed to not bend or cross your legs.  The insertion site will be checked frequently.    The pulse in your feet or wrist will be checked frequently.    Additional blood tests, X-rays, and an electrocardiogram may be done.       Call the [place] office at [phone] if you have any questions, problems or concerns.

## 2019-10-09 ENCOUNTER — Telehealth: Payer: Self-pay | Admitting: *Deleted

## 2019-10-09 ENCOUNTER — Other Ambulatory Visit (HOSPITAL_COMMUNITY)
Admission: RE | Admit: 2019-10-09 | Discharge: 2019-10-09 | Disposition: A | Discharge status: Home / Self Care | Payer: Medicare Other | Source: Ambulatory Visit | Attending: Cardiovascular Disease | Admitting: Cardiovascular Disease

## 2019-10-09 DIAGNOSIS — Z20822 Contact with and (suspected) exposure to covid-19: Secondary | ICD-10-CM | POA: Insufficient documentation

## 2019-10-09 DIAGNOSIS — Z01818 Encounter for other preprocedural examination: Secondary | ICD-10-CM | POA: Insufficient documentation

## 2019-10-09 LAB — SARS CORONAVIRUS 2 (TAT 6-24 HRS): SARS Coronavirus 2: NEGATIVE

## 2019-10-09 NOTE — Telephone Encounter (Signed)
Pt contacted pre-catheterization scheduled at [facility] for: Friday October 10, 2019 7:30 AM Verified arrival time and place: [facility]) at: 5:30 AM   Nothing to eat or drink after midnight prior to procedure, may have sips of water to take medications.  Hold: Alogliptin-AM of procedure Insulin-AM of procedure 1/2 usual HS Insulin dose if needed  Except hold medications AM meds can be  taken pre-cath with sips of water including: ASA 81 mg Plavix 75 mg  Confirmed patient has responsible adult to drive home post procedure and be with patient first 24 hours after arriving home:yes  You are allowed ONE visitor in the waiting room during the time you are at the hospital for your procedure. Both you and your visitor must wear a mask once you enter the hospital.       COVID-19 Pre-Screening Questions:   In the past 10 days have you had a new cough, shortness of breath, headache, congestion, fever (100 or greater) unexplained body aches, new sore throat, or sudden loss of taste or sense of smell? no  In the past 10 days have you been around anyone with known Covid 19? no  Have you been vaccinated for COVID-19? Yes, see immunization history   Reviewed procedure/mask/visitor instructions, COVID-19 questions with patient.

## 2019-10-10 ENCOUNTER — Ambulatory Visit (HOSPITAL_COMMUNITY)
Admission: RE | Admit: 2019-10-10 | Discharge: 2019-10-10 | Disposition: A | Discharge status: Home / Self Care | Payer: Medicare Other | Attending: Cardiovascular Disease | Admitting: Cardiovascular Disease

## 2019-10-10 ENCOUNTER — Encounter (HOSPITAL_COMMUNITY)
Admission: RE | Disposition: A | Discharge status: Home / Self Care | Payer: Self-pay | Source: Home / Self Care | Attending: Cardiovascular Disease

## 2019-10-10 ENCOUNTER — Encounter (HOSPITAL_COMMUNITY): Payer: Self-pay | Admitting: Cardiovascular Disease

## 2019-10-10 DIAGNOSIS — Z85820 Personal history of malignant melanoma of skin: Secondary | ICD-10-CM | POA: Diagnosis not present

## 2019-10-10 DIAGNOSIS — Z7902 Long term (current) use of antithrombotics/antiplatelets: Secondary | ICD-10-CM | POA: Insufficient documentation

## 2019-10-10 DIAGNOSIS — I25118 Atherosclerotic heart disease of native coronary artery with other forms of angina pectoris: Secondary | ICD-10-CM

## 2019-10-10 DIAGNOSIS — Z79899 Other long term (current) drug therapy: Secondary | ICD-10-CM | POA: Diagnosis not present

## 2019-10-10 DIAGNOSIS — I252 Old myocardial infarction: Secondary | ICD-10-CM | POA: Insufficient documentation

## 2019-10-10 DIAGNOSIS — N183 Chronic kidney disease, stage 3 unspecified: Secondary | ICD-10-CM | POA: Diagnosis not present

## 2019-10-10 DIAGNOSIS — I4510 Unspecified right bundle-branch block: Secondary | ICD-10-CM | POA: Diagnosis not present

## 2019-10-10 DIAGNOSIS — D509 Iron deficiency anemia, unspecified: Secondary | ICD-10-CM | POA: Insufficient documentation

## 2019-10-10 DIAGNOSIS — I129 Hypertensive chronic kidney disease with stage 1 through stage 4 chronic kidney disease, or unspecified chronic kidney disease: Secondary | ICD-10-CM | POA: Insufficient documentation

## 2019-10-10 DIAGNOSIS — Z8249 Family history of ischemic heart disease and other diseases of the circulatory system: Secondary | ICD-10-CM | POA: Insufficient documentation

## 2019-10-10 DIAGNOSIS — Z794 Long term (current) use of insulin: Secondary | ICD-10-CM | POA: Diagnosis not present

## 2019-10-10 DIAGNOSIS — I25119 Atherosclerotic heart disease of native coronary artery with unspecified angina pectoris: Secondary | ICD-10-CM

## 2019-10-10 DIAGNOSIS — Z955 Presence of coronary angioplasty implant and graft: Secondary | ICD-10-CM | POA: Diagnosis not present

## 2019-10-10 DIAGNOSIS — I25110 Atherosclerotic heart disease of native coronary artery with unstable angina pectoris: Secondary | ICD-10-CM | POA: Insufficient documentation

## 2019-10-10 DIAGNOSIS — E1122 Type 2 diabetes mellitus with diabetic chronic kidney disease: Secondary | ICD-10-CM | POA: Insufficient documentation

## 2019-10-10 DIAGNOSIS — E785 Hyperlipidemia, unspecified: Secondary | ICD-10-CM | POA: Diagnosis not present

## 2019-10-10 DIAGNOSIS — K219 Gastro-esophageal reflux disease without esophagitis: Secondary | ICD-10-CM | POA: Insufficient documentation

## 2019-10-10 DIAGNOSIS — Z885 Allergy status to narcotic agent status: Secondary | ICD-10-CM | POA: Diagnosis not present

## 2019-10-10 HISTORY — PX: LEFT HEART CATH AND CORONARY ANGIOGRAPHY: CATH118249

## 2019-10-10 LAB — GLUCOSE, CAPILLARY
Glucose-Capillary: 168 mg/dL — ABNORMAL HIGH (ref 70–99)
Glucose-Capillary: 163 mg/dL — ABNORMAL HIGH (ref 70–99)

## 2019-10-10 SURGERY — LEFT HEART CATH AND CORONARY ANGIOGRAPHY
Anesthesia: LOCAL

## 2019-10-10 MED ORDER — ASPIRIN 81 MG PO CHEW: 81.0000 mg | CHEWABLE_TABLET | Freq: Every day | ORAL | Status: DC

## 2019-10-10 MED ORDER — LIDOCAINE HCL (PF) 1 % IJ SOLN
INTRAMUSCULAR | Status: AC
  Filled 2019-10-10: qty 60

## 2019-10-10 MED ORDER — SODIUM CHLORIDE 0.9 % IV SOLN: 250.0000 mL | INTRAVENOUS | Status: DC | PRN

## 2019-10-10 MED ORDER — VERAPAMIL HCL 2.5 MG/ML IV SOLN
INTRAVENOUS | Status: DC | PRN
  Administered 2019-10-10: 10 mL via INTRA_ARTERIAL

## 2019-10-10 MED ORDER — SODIUM CHLORIDE 0.9 % IV SOLN: INTRAVENOUS | Status: DC

## 2019-10-10 MED ORDER — ONDANSETRON HCL 4 MG/2ML IJ SOLN: 4.0000 mg | Freq: Four times a day (QID) | INTRAMUSCULAR | Status: DC | PRN

## 2019-10-10 MED ORDER — DIAZEPAM 5 MG PO TABS: 5.0000 mg | ORAL_TABLET | ORAL | Status: DC | PRN

## 2019-10-10 MED ORDER — ASPIRIN 81 MG PO CHEW: 81.0000 mg | CHEWABLE_TABLET | ORAL | Status: DC

## 2019-10-10 MED ORDER — FENTANYL CITRATE (PF) 100 MCG/2ML IJ SOLN
INTRAMUSCULAR | Status: DC | PRN
Start: 2019-10-10 — End: 2019-10-10
  Administered 2019-10-10 (×2): 25 ug via INTRAVENOUS

## 2019-10-10 MED ORDER — HYDRALAZINE HCL 20 MG/ML IJ SOLN
INTRAMUSCULAR | Status: AC
  Filled 2019-10-10: qty 1

## 2019-10-10 MED ORDER — ACETAMINOPHEN 325 MG PO TABS: 650.0000 mg | ORAL_TABLET | ORAL | Status: DC | PRN

## 2019-10-10 MED ORDER — HEPARIN (PORCINE) IN NACL 1000-0.9 UT/500ML-% IV SOLN
INTRAVENOUS | Status: AC
  Filled 2019-10-10: qty 1000

## 2019-10-10 MED ORDER — HEPARIN (PORCINE) IN NACL 1000-0.9 UT/500ML-% IV SOLN
INTRAVENOUS | Status: DC | PRN
  Administered 2019-10-10 (×2): 500 mL

## 2019-10-10 MED ORDER — MIDAZOLAM HCL 2 MG/2ML IJ SOLN
INTRAMUSCULAR | Status: AC
  Filled 2019-10-10: qty 2

## 2019-10-10 MED ORDER — IOHEXOL 350 MG/ML SOLN
INTRAVENOUS | Status: DC | PRN
  Administered 2019-10-10: 80 mL

## 2019-10-10 MED ORDER — LABETALOL HCL 5 MG/ML IV SOLN: 10.0000 mg | INTRAVENOUS | Status: DC | PRN

## 2019-10-10 MED ORDER — HYDRALAZINE HCL 20 MG/ML IJ SOLN
10.0000 mg | INTRAMUSCULAR | Status: DC | PRN
  Administered 2019-10-10: 10 mg via INTRAVENOUS

## 2019-10-10 MED ORDER — MIDAZOLAM HCL 2 MG/2ML IJ SOLN
INTRAMUSCULAR | Status: DC | PRN
  Administered 2019-10-10 (×2): 1 mg via INTRAVENOUS

## 2019-10-10 MED ORDER — FENTANYL CITRATE (PF) 100 MCG/2ML IJ SOLN
INTRAMUSCULAR | Status: AC
  Filled 2019-10-10: qty 2

## 2019-10-10 MED ORDER — HEPARIN SODIUM (PORCINE) 1000 UNIT/ML IJ SOLN
INTRAMUSCULAR | Status: DC | PRN
  Administered 2019-10-10: 4500 [IU] via INTRAVENOUS

## 2019-10-10 MED ORDER — CLOPIDOGREL BISULFATE 75 MG PO TABS: 75.0000 mg | ORAL_TABLET | Freq: Every day | ORAL | Status: DC

## 2019-10-10 MED ORDER — VERAPAMIL HCL 2.5 MG/ML IV SOLN
INTRAVENOUS | Status: AC
  Filled 2019-10-10: qty 2

## 2019-10-10 MED ORDER — SODIUM CHLORIDE 0.9% FLUSH: 3.0000 mL | INTRAVENOUS | Status: DC | PRN

## 2019-10-10 MED ORDER — LIDOCAINE HCL (PF) 1 % IJ SOLN
INTRAMUSCULAR | Status: DC | PRN
  Administered 2019-10-10: 2 mL

## 2019-10-10 MED ORDER — SODIUM CHLORIDE 0.9% FLUSH: 3.0000 mL | Freq: Two times a day (BID) | INTRAVENOUS | Status: DC

## 2019-10-10 SURGICAL SUPPLY — 12 items
CATH INFINITI JR4 5F (CATHETERS) ×2 IMPLANT
CATH OPTITORQUE TIG 4.0 5F (CATHETERS) ×2 IMPLANT
DEVICE RAD COMP TR BAND LRG (VASCULAR PRODUCTS) ×2 IMPLANT
GLIDESHEATH SLEND SS 6F .021 (SHEATH) ×2 IMPLANT
GUIDEWIRE INQWIRE 1.5J.035X260 (WIRE) ×1 IMPLANT
INQWIRE 1.5J .035X260CM (WIRE) ×2
KIT HEART LEFT (KITS) ×2 IMPLANT
PACK CARDIAC CATHETERIZATION (CUSTOM PROCEDURE TRAY) ×2 IMPLANT
SHEATH PROBE COVER 6X72 (BAG) ×2 IMPLANT
TRANSDUCER W/STOPCOCK (MISCELLANEOUS) ×2 IMPLANT
TUBING CIL FLEX 10 FLL-RA (TUBING) ×2 IMPLANT
WIRE HI TORQ VERSACORE-J 145CM (WIRE) ×2 IMPLANT

## 2019-10-10 NOTE — Discharge Instructions (Signed)
Drink plenty of fluids for 48 hours and keep wrist elevated at heart level for 24 hours  Radial Site Care   This sheet gives you information about how to care for yourself after your procedure. Your health care provider may also give you more specific instructions. If you have problems or questions, contact your health care provider. What can I expect after the procedure? After the procedure, it is common to have:  Bruising and tenderness at the catheter insertion area. Follow these instructions at home: Medicines  Take over-the-counter and prescription medicines only as told by your health care provider. Insertion site care 1. Follow instructions from your health care provider about how to take care of your insertion site. Make sure you: ? Wash your hands with soap and water before you change your bandage (dressing). If soap and water are not available, use hand sanitizer. ? Remove your dressing as told by your health care provider. In 24 hours 2. Check your insertion site every day for signs of infection. Check for: ? Redness, swelling, or pain. ? Fluid or blood. ? Pus or a bad smell. ? Warmth. 3. Do not take baths, swim, or use a hot tub until your health care provider approves. 4. You may shower 24-48 hours after the procedure, or as directed by your health care provider. ? Remove the dressing and gently wash the site with plain soap and water. ? Pat the area dry with a clean towel. ? Do not rub the site. That could cause bleeding. 5. Do not apply powder or lotion to the site. Activity   1. For 24 hours after the procedure, or as directed by your health care provider: ? Do not flex or bend the affected arm. ? Do not push or pull heavy objects with the affected arm. ? Do not drive yourself home from the hospital or clinic. You may drive 24 hours after the procedure unless your health care provider tells you not to. ? Do not operate machinery or power tools. 2. Do not lift  anything that is heavier than 10 lb (4.5 kg), or the limit that you are told, until your health care provider says that it is safe.  For 4 days 3. Ask your health care provider when it is okay to: ? Return to work or school. ? Resume usual physical activities or sports. ? Resume sexual activity. General instructions  If the catheter site starts to bleed, raise your arm and put firm pressure on the site. If the bleeding does not stop, get help right away. This is a medical emergency.  If you went home on the same day as your procedure, a responsible adult should be with you for the first 24 hours after you arrive home.  Keep all follow-up visits as told by your health care provider. This is important. Contact a health care provider if:  You have a fever.  You have redness, swelling, or yellow drainage around your insertion site. Get help right away if:  You have unusual pain at the radial site.  The catheter insertion area swells very fast.  The insertion area is bleeding, and the bleeding does not stop when you hold steady pressure on the area.  Your arm or hand becomes pale, cool, tingly, or numb. These symptoms may represent a serious problem that is an emergency. Do not wait to see if the symptoms will go away. Get medical help right away. Call your local emergency services (911 in the [place] Do   not drive yourself to the hospital. Summary  After the procedure, it is common to have bruising and tenderness at the site.  Follow instructions from your health care provider about how to take care of your radial site wound. Check the wound every day for signs of infection.  Do not lift anything that is heavier than 10 lb (4.5 kg), or the limit that you are told, until your health care provider says that it is safe. This information is not intended to replace advice given to you by your health care provider. Make sure you discuss any questions you have with your health care  provider. Document Revised: 02/28/2017 Document Reviewed: 02/28/2017 Elsevier Patient Education  2020 Elsevier Inc.  

## 2019-10-10 NOTE — Interval H&P Note (Signed)
Cath Lab Visit (complete for each Cath Lab visit)  Clinical Evaluation Leading to the Procedure:   ACS: No.  Non-ACS:    Anginal Classification: CCS III  Anti-ischemic medical therapy: Maximal Therapy (2 or more classes of medications)  Non-Invasive Test Results: No non-invasive testing performed  Prior CABG: No previous CABG      History and Physical Interval Note:  10/10/2019 7:40 AM  Matthew [name]  has presented today for surgery, with the diagnosis of Unstable angina.  The various methods of treatment have been discussed with the patient and family. After consideration of risks, benefits and other options for treatment, the patient has consented to  Procedure(s): LEFT HEART CATH AND CORONARY ANGIOGRAPHY (N/A) as a surgical intervention.  The patient's history has been reviewed, patient examined, no change in status, stable for surgery.  I have reviewed the patient's chart and labs.  Questions were answered to the patient's satisfaction.     Shelva Majestic

## 2019-11-05 ENCOUNTER — Ambulatory Visit: Payer: Medicare Other | Admitting: Physician Assistant

## 2019-11-12 NOTE — Progress Notes (Signed)
Cardiology Office Note    Date:  11/18/2019   ID:  Matthew [name], DOB Aug 30, 1940, MRN [id number]  PCP:  Wenda Low, MD  Cardiologist: Lauree Chandler, MD EPS: None  Chief Complaint  Patient presents with  . Hospitalization Follow-up    History of Present Illness:  Matthew [name] is a 79 y.o. male with history of hypertension, HLD, DM, CAD status post NSTEMI 2018 treated with 3 DES to the LAD, DES to the RCA and PTCA to the PLA branch.  GI bleed on Brilinta and aspirin and switch to Plavix.  Patient was admitted with recurrent chest pain cardiac cath on 10/08/2019 showed no significant change in the LAD and  RCA stents but does appear to be progression of disease in the very proximal RCA which gives rise to small branch with 90 to 95% ostial stenosis.  Medical therapy recommended.  Consider titrating beta-blocker up if HR allows. Imdur already increased to 90 mg daily.  Patient comes in for f/u. No further chest pain or shortness of breath. [place]. No bleeding problems.    Past Medical History:  Diagnosis Date  . Anticoagulated    plavix  . Arthritis    Shoulder, knees, back   . CAD in native artery cardiologist-  dr Angelena Form   a. CAD/NSTEMI ,  cardiac cath staged stenting-- 12-25-2016  PTCA and DES x3 to prox. and mid LAD;  12-26-2016  PCI to PLA and DES x1 to midRCA,  EF 60-65%.  . Chronic lower back pain   . CKD (chronic kidney disease), stage III ([place])   . Complication of anesthesia    "Too much with shoulder surgery", pt. reports that he was told the at they "lost him, due to absorbing too much anesthesia".  shoulder surgery 1985  . DDD (degenerative disc disease), lumbosacral   . GERD (gastroesophageal reflux disease)   . History of gastric ulcer 01/2017  . History of kidney stones   . History of malignant melanoma    right side of nose  . History of non-ST elevation myocardial infarction  (NSTEMI) 12/23/2017   s/p  staged cardiac cath,  s/p  PCI and DEStenting  . Hyperlipidemia   . Hypertension   . IDA (iron deficiency anemia)   . Nocturia   . RBBB   . Renal calculus, right   . S/P drug eluting coronary stent placement 12-25-2017,  12-26-2017   PTCA and DES x3 to prox. and mid LAD;  PCI to PLA and DES x1 to [facility]  . Type 2 diabetes mellitus treated with insulin ([place])    followed by pcp    Past Surgical History:  Procedure Laterality Date  . ANAL FISSURE REPAIR  X 2  . CATARACT EXTRACTION W/ INTRAOCULAR LENS  IMPLANT, BILATERAL Bilateral 2017;  2015  . COLONOSCOPY WITH PROPOFOL N/A 06/05/2016   Procedure: COLONOSCOPY WITH PROPOFOL;  Surgeon: Garlan Fair, MD;  Location: [facility];  Service: Endoscopy;  Laterality: N/A;  . CORONARY ANGIOGRAPHY N/A 12/26/2016   Procedure: CORONARY ANGIOGRAPHY;  Surgeon: Troy Sine, MD;  Location: [place] [facility];  Service: Cardiovascular;  Laterality: N/A;  . CORONARY STENT INTERVENTION N/A 12/25/2016   Procedure: CORONARY STENT INTERVENTION;  Surgeon: Burnell Blanks, MD;  Location: [place] [facility];  Service: Cardiovascular;  Laterality: N/A;  . CORONARY STENT INTERVENTION N/A 12/26/2016   Procedure: CORONARY STENT INTERVENTION;  Surgeon: Troy Sine,  MD;  Location: [place] [facility];  Service: Cardiovascular;  Laterality: N/A;  . CYSTOSCOPY/URETEROSCOPY/HOLMIUM LASER/STENT PLACEMENT Right 01/07/2018   Procedure: RIGHT URETEROSCOPY/HOLMIUM LASER/STENT PLACEMENT;  Surgeon: Lucas Mallow, MD;  Location: [facility];  Service: Urology;  Laterality: Right;  . ESOPHAGOGASTRODUODENOSCOPY (EGD) WITH PROPOFOL N/A 01/12/2017   Procedure: ESOPHAGOGASTRODUODENOSCOPY (EGD) WITH PROPOFOL;  Surgeon: Wilford Corner, MD;  Location: [place];  Service: Endoscopy;  Laterality: N/A;  . HAND TENDON SURGERY Right 10-29-2002   dr Burney Gauze @[facility]    right index and thumb  . KNEE ARTHROSCOPY Bilateral  2009-;2010   @[facility]   . LEFT HEART CATH AND CORONARY ANGIOGRAPHY N/A 12/25/2016   Procedure: LEFT HEART CATH AND CORONARY ANGIOGRAPHY;  Surgeon: Burnell Blanks, MD;  Location: [place] [facility];  Service: Cardiovascular;  Laterality: N/A;  . LEFT HEART CATH AND CORONARY ANGIOGRAPHY N/A 10/10/2019   Procedure: LEFT HEART CATH AND CORONARY ANGIOGRAPHY;  Surgeon: Troy Sine, MD;  Location: [place] [facility];  Service: Cardiovascular;  Laterality: N/A;  . LUMBAR LAMINECTOMY/DECOMPRESSION MICRODISCECTOMY N/A 06/09/2015   Procedure: Laminectomy - T12-L1;  Surgeon: Eustace Moore, MD;  Location: [place] [facility];  Service: Neurosurgery;  Laterality: N/A;  Laminectomy - T12-L1  . MEDIAL PARTIAL KNEE REPLACEMENT Bilateral 2009-2010    [facility]   . MELANOMA EXCISION Right    "side of my nose"  . SHOULDER SURGERY Left 1985  . TOTAL SHOULDER ARTHROPLASTY Left 12/06/2012   Procedure: LEFT TOTAL SHOULDER ARTHROPLASTY VERSES A REVERSE TOTAL SHOULDER ARTHROPLASTY;  Surgeon: Augustin Schooling, MD;  Location: [place];  Service: Orthopedics;  Laterality: Left;    Current Medications: Current Meds  Medication Sig  . acetaminophen (TYLENOL) 325 MG tablet Take 2 tablets (650 mg total) by mouth every 6 (six) hours as needed for mild pain or headache.  . Alogliptin Benzoate 12.5 MG TABS Take 12.5 mg by mouth daily.  Marland Kitchen amLODipine (NORVASC) 10 MG tablet Take 10 mg by mouth daily.  . clopidogrel (PLAVIX) 75 MG tablet Take 1 tablet (75 mg total) by mouth daily.  . Evolocumab (REPATHA SURECLICK) 956 MG/ML SOAJ Inject 1 pen into the skin every 14 (fourteen) days.  Marland Kitchen ezetimibe (ZETIA) 10 MG tablet Take 1 tablet (10 mg total) by mouth daily.  Marland Kitchen FERREX 150 150 MG capsule Take 150 mg by mouth daily.   . insulin aspart protamine - aspart (NOVOLOG 70/30 FLEXPEN RELION) (70-30) 100 UNIT/ML FlexPen Inject 24 Units into the skin 2 (two) times daily.   . isosorbide mononitrate (IMDUR) 60 MG 24 hr tablet Take 1.5 tablets (90  mg total) by mouth daily.  . metoprolol tartrate (LOPRESSOR) 25 MG tablet TAKE 1 TABLET BY MOUTH TWICE A DAY  . nitroGLYCERIN (NITROSTAT) 0.4 MG SL tablet Place 1 tablet (0.4 mg total) under the tongue every 5 (five) minutes as needed for chest pain.  . Omega-3 Fatty Acids (FISH OIL) 1000 MG CAPS Take 1,000 mg by mouth daily.  . pantoprazole (PROTONIX) 40 MG tablet Take 40 mg by mouth daily.  . TURMERIC PO Take 1,500 mg by mouth 2 (two) times daily.  . Vitamin D, Cholecalciferol, 25 MCG (1000 UT) CAPS Take 1,000 Units by mouth daily.   . [DISCONTINUED] isosorbide mononitrate (IMDUR) 60 MG 24 hr tablet Take 1.5 tablets (90 mg total) by mouth daily.     Allergies:   Statins, Oxycodone, Hydrocodone, Codeine, and Morphine and related   Social History   Socioeconomic History  . Marital status: Married  Spouse name: Not on file  . Number of children: Not on file  . Years of education: Not on file  . Highest education level: Not on file  Occupational History  . Not on file  Tobacco Use  . Smoking status: Never Smoker  . Smokeless tobacco: Never Used  Vaping Use  . Vaping Use: Never used  Substance and Sexual Activity  . Alcohol use: No  . Drug use: No  . Sexual activity: Yes  Other Topics Concern  . Not on file  Social History Narrative  . Not on file   Social Determinants of Health   Financial Resource Strain:   . Difficulty of Paying Living Expenses: Not on file  Food Insecurity:   . Worried About Charity fundraiser in the Last Year: Not on file  . Ran Out of Food in the Last Year: Not on file  Transportation Needs:   . Lack of Transportation (Medical): Not on file  . Lack of Transportation (Non-Medical): Not on file  Physical Activity:   . Days of Exercise per Week: Not on file  . Minutes of Exercise per Session: Not on file  Stress:   . Feeling of Stress : Not on file  Social Connections:   . Frequency of Communication with Friends and Family: Not on file  .  Frequency of Social Gatherings with Friends and Family: Not on file  . Attends Religious Services: Not on file  . Active Member of Clubs or Organizations: Not on file  . Attends Archivist Meetings: Not on file  . Marital Status: Not on file     Family History:  The patient's family history includes CAD in his father and sister.   ROS:   Please see the history of present illness.    ROS All other systems reviewed and are negative.   PHYSICAL EXAM:   VS:  BP 138/74   Pulse 60   Ht 5\' 8"  (1.727 m)   Wt 197 lb (89.4 kg)   SpO2 98%   BMI 29.95 kg/m   Physical Exam  GEN: Well nourished, well developed, in no acute distress  Neck: no JVD, carotid bruits, or masses Cardiac:RRR; no murmurs, rubs, or gallops  Respiratory:  clear to auscultation bilaterally, normal work of breathing GI: soft, nontender, nondistended, + BS Ext: without cyanosis, clubbing, or edema, Good distal pulses bilaterally. Right arm without hematoma or hemorrhage at cath site. Good radial and brachial pulses. Neuro:  Alert and Oriented x 3 Psych: euthymic mood, full affect  Wt Readings from Last 3 Encounters:  11/18/19 197 lb (89.4 kg)  10/10/19 203 lb (92.1 kg)  10/08/19 203 lb (92.1 kg)      Studies/Labs Reviewed:   EKG:  EKG is not ordered today.    Recent Labs: 10/08/2019: ALT 32; BUN 19; Creatinine, Ser 1.17; Hemoglobin 13.8; Platelets 216; Potassium 4.5; Sodium 141   Lipid Panel    Component Value Date/Time   CHOL 143 01/22/2018 1257   TRIG 213 (H) 01/22/2018 1257   HDL 32 (L) 01/22/2018 1257   CHOLHDL 4.5 01/22/2018 1257   CHOLHDL 4.4 12/24/2016 0634   VLDL 13 12/24/2016 0634   LDLCALC 68 01/22/2018 1257    Additional studies/ records that were reviewed today include:  Cardiac catheterization 10/10/2019  Mid Cx lesion is 70% stenosed.  1st Diag lesion is 40% stenosed.  Previously placed Prox LAD to Mid LAD stent (unknown type) is widely patent.  Previously placed Prox RCA  to  Mid RCA stent (unknown type) is widely patent.  RV Branch lesion is 95% stenosed.  Prox RCA lesion is 50% stenosed.  RPDA lesion is 60% stenosed.  3rd RPL lesion is 20% stenosed.  The left ventricular systolic function is normal.  LV end diastolic pressure is normal.  The left ventricular ejection fraction is 55-65% by visual estimate.   Very long left main coronary artery which gives rise to a large LAD and very small caliber circumflex vessel.    The previously placed tandem stents in the LAD are widely patent.  There is mild 40% narrowing in a diagonal vessel proximal to the stented segment.     The left circumflex vessel is very small caliber with narrowing 70- 75% proximally which does not appear to be significantly change from the prior study.   The RCA is a large dominant vessel.  The stent in the proximal to mid RCA is widely patent.  The very proximal RCA has 50% narrowing and gives rise to a SA nodal artery.  There is 90-95% ostial stenosis in this small proximal branch, the PDA has previously noted diffuse 60% proximal to mid stenosis, and the PLA vessel site of prior PTCA is patent with residual narrowing less than 20%.   Normal LV function with EF estimate approximately 60%.  LVEDP 15 mmHg.   RECOMMENDATION: There is no significant change in the previously stented segments of the LAD and RCA with patent PTCA site of the PLA vessel..  There does appear to be progression of disease in the very proximal RCA which gives rise to a small branch with 90-95% ostial stenosis.  Circumflex vessel is very small caliber vessel.  Recommend increase medical therapy trial; your had just been increased to 90 mg daily.  Depending upon heart rate, consider further titration of beta-blocker therapy.  The patient is on PCSK9 inhibition in addition to Zetia for aggressive lipid-lowering.    Myoview Study Highlights 04/2018    Nuclear stress EF: 69%.  There was no ST segment deviation noted  during stress.  No T wave inversion was noted during stress.  This is a low risk study.  The left ventricular ejection fraction is hyperdynamic (>65%).   Echo Study Conclusions 12/2016  - Left ventricle: The cavity size was normal. Wall thickness was    normal. Systolic function was normal. The estimated ejection    fraction was in the range of 60% to 65%. Wall motion was normal;    there were no regional wall motion abnormalities. Left    ventricular diastolic function parameters were normal.  - Mitral valve: Calcified annulus.  - Atrial septum: No defect or patent foramen ovale was identified.  - Pulmonary arteries: PA peak pressure: 36 mm Hg (S).      CORONARY ANGIOGRAPHY 12/2016  CORONARY STENT INTERVENTION  Conclusion      1st Diag lesion is 99% stenosed.  Previously placed Prox LAD drug eluting stent is widely patent.  Balloon angioplasty was performed.  Ost Cx to Mid Cx lesion is 20% stenosed.  Prox RCA lesion is 85% stenosed.  Ost RPDA to RPDA lesion is 60% stenosed.  Post Atrio lesion is 99% stenosed.  Post intervention, there is a 10% residual stenosis.  Post intervention, there is a 0% residual stenosis.  A stent was successfully placed.  Previously placed Mid LAD-1 drug eluting stent is widely patent.  Balloon angioplasty was performed.  Mid LAD-2 lesion is 50% stenosed.   Relook of the  LAD reveals 3 widely patent tandem stents without restenosis.  There is 50% narrowing in the LAD in the mid segment beyond the distal stent.   The RCA had previously noted 85% eccentric stenosis on the bend in the mid RCA, there is diffuse 50-60% PDA stenoses and 99% PLA stenosis.  Following successful PCI the PLA 99% stenosis treated with PTCA was reduced to less than 10% and a 85% eccentric mid RCA stenosis was reduced to 0% with insertion of a 2.515 mm Xience Sierra DES stent postdilated to 2.75 mm.   RECOMMENDATION: The patient should continue with the DAPT for  minimum of one-year.  Medical therapy for concomitant CAD and aggressive lipid-lowering strategy.          ASSESSMENT:    1. Coronary artery disease involving native coronary artery of native heart without angina pectoris   2. Essential hypertension   3. Hyperlipidemia, unspecified hyperlipidemia type   4. Type 2 diabetes mellitus with stage 2 chronic kidney disease, with long-term current use of insulin (HCC)      PLAN:  In order of problems listed above:  CAD NSTEMI 12/2016 treated with 3 DES in the LAD and 1 DES in the mid RCA PLA treated with PTCA cardiac cath 10/10/2019 no significant change in the previously stented LAD and RCA with patent PTCA site of the PLA.  Some progression in the very proximal RCA gives rise to a small branch with 90 to 95% ostial stenosis recommend medical therapy. Doing well now without angina and increased Imdur. Will fax Rx to [facility] hypertension BP controlled  HLD statin intolerance on Repatha and Zetia  DM managed by PCP  GI bleed on Brilinta and aspirin eventually changed to Plavix-no bleeding problems  Medication Adjustments/Labs and Tests Ordered: Current medicines are reviewed at length with the patient today.  Concerns regarding medicines are outlined above.  Medication changes, Labs and Tests ordered today are listed in the Patient Instructions below. Patient Instructions  Medication Instructions:  Your physician recommends that you continue on your current medications as directed. Please refer to the Current Medication list given to you today.  *If you need a refill on your cardiac medications before your next appointment, please call your pharmacy*   Lab Work: None If you have labs (blood work) drawn today and your tests are completely normal, you will receive your results only by: Marland Kitchen MyChart Message (if you have MyChart) OR . A paper copy in the mail If you have any lab test that is abnormal or we need to change your  treatment, we will call you to review the results.   Testing/Procedures: None   Follow-Up: At [facility], you and your health needs are our priority.  As part of our continuing mission to provide you with exceptional heart care, we have created designated Provider Care Teams.  These Care Teams include your primary Cardiologist (physician) and Advanced Practice Providers (APPs -  Physician Assistants and Nurse Practitioners) who all work together to provide you with the care you need, when you need it.  We recommend signing up for the patient portal called "MyChart".  Sign up information is provided on this After Visit Summary.  MyChart is used to connect with patients for Virtual Visits (Telemedicine).  Patients are able to view lab/test results, encounter notes, upcoming appointments, etc.  Non-urgent messages can be sent to your provider as well.   To learn more about what you can do with MyChart, go to [email or web address].  Your next appointment:   6 month(s)  The format for your next appointment:   In Person  Provider:   Lauree Chandler, MD     Signed, Ermalinda Barrios, PA-C  11/18/2019 11:01 AM    [facility] [address], [address], [place]  [address] Phone: [phone]; Fax: [phone]

## 2019-11-18 ENCOUNTER — Ambulatory Visit: Payer: Medicare Other | Admitting: Physician Assistant

## 2019-11-18 ENCOUNTER — Other Ambulatory Visit: Payer: Self-pay

## 2019-11-18 ENCOUNTER — Encounter: Payer: Self-pay | Admitting: Physician Assistant

## 2019-11-18 VITALS — BP 138/74 | HR 60 | Ht 68.0 in | Wt 197.0 lb

## 2019-11-18 DIAGNOSIS — E785 Hyperlipidemia, unspecified: Secondary | ICD-10-CM

## 2019-11-18 DIAGNOSIS — E1122 Type 2 diabetes mellitus with diabetic chronic kidney disease: Secondary | ICD-10-CM | POA: Diagnosis not present

## 2019-11-18 DIAGNOSIS — I10 Essential (primary) hypertension: Secondary | ICD-10-CM | POA: Diagnosis not present

## 2019-11-18 DIAGNOSIS — I2510 Atherosclerotic heart disease of native coronary artery without angina pectoris: Secondary | ICD-10-CM

## 2019-11-18 DIAGNOSIS — N182 Chronic kidney disease, stage 2 (mild): Secondary | ICD-10-CM

## 2019-11-18 DIAGNOSIS — Z794 Long term (current) use of insulin: Secondary | ICD-10-CM

## 2019-11-18 MED ORDER — ISOSORBIDE MONONITRATE ER 60 MG PO TB24: 90.0000 mg | ORAL_TABLET | Freq: Every day | ORAL | 3 refills | Status: DC

## 2019-11-18 NOTE — Patient Instructions (Signed)
Medication Instructions:  Your physician recommends that you continue on your current medications as directed. Please refer to the Current Medication list given to you today.  *If you need a refill on your cardiac medications before your next appointment, please call your pharmacy*   Lab Work: None If you have labs (blood work) drawn today and your tests are completely normal, you will receive your results only by: Marland Kitchen MyChart Message (if you have MyChart) OR . A paper copy in the mail If you have any lab test that is abnormal or we need to change your treatment, we will call you to review the results.   Testing/Procedures: None   Follow-Up: At [facility], you and your health needs are our priority.  As part of our continuing mission to provide you with exceptional heart care, we have created designated Provider Care Teams.  These Care Teams include your primary Cardiologist (physician) and Advanced Practice Providers (APPs -  Physician Assistants and Nurse Practitioners) who all work together to provide you with the care you need, when you need it.  We recommend signing up for the patient portal called "MyChart".  Sign up information is provided on this After Visit Summary.  MyChart is used to connect with patients for Virtual Visits (Telemedicine).  Patients are able to view lab/test results, encounter notes, upcoming appointments, etc.  Non-urgent messages can be sent to your provider as well.   To learn more about what you can do with MyChart, go to [email or web address].    Your next appointment:   6 month(s)  The format for your next appointment:   In Person  Provider:   Lauree Chandler, MD

## 2019-11-18 NOTE — Addendum Note (Signed)
Addended by: Carylon Perches on: 11/18/2019 11:02 AM   Modules accepted: Orders

## 2019-11-19 ENCOUNTER — Ambulatory Visit (INDEPENDENT_AMBULATORY_CARE_PROVIDER_SITE_OTHER): Payer: Medicare Other | Admitting: "Endocrinology

## 2019-11-19 ENCOUNTER — Encounter: Payer: Self-pay | Admitting: "Endocrinology

## 2019-11-19 VITALS — BP 161/87 | HR 60 | Ht 68.0 in | Wt 196.2 lb

## 2019-11-19 DIAGNOSIS — E1159 Type 2 diabetes mellitus with other circulatory complications: Secondary | ICD-10-CM | POA: Diagnosis not present

## 2019-11-19 DIAGNOSIS — I739 Peripheral vascular disease, unspecified: Secondary | ICD-10-CM

## 2019-11-19 DIAGNOSIS — I10 Essential (primary) hypertension: Secondary | ICD-10-CM | POA: Diagnosis not present

## 2019-11-19 DIAGNOSIS — E782 Mixed hyperlipidemia: Secondary | ICD-10-CM

## 2019-11-19 MED ORDER — METFORMIN HCL ER 500 MG PO TB24: 500.0000 mg | ORAL_TABLET | Freq: Every day | ORAL | 3 refills | Status: DC

## 2019-11-19 MED ORDER — ALOGLIPTIN BENZOATE 12.5 MG PO TABS
12.5000 mg | ORAL_TABLET | Freq: Every day | ORAL | 1 refills | Status: DC
Start: 2019-11-19 — End: 2022-03-20

## 2019-11-19 NOTE — Patient Instructions (Signed)

## 2019-11-19 NOTE — Progress Notes (Signed)
Endocrinology Consult Note       11/19/2019, 4:33 PM   Subjective:    Patient ID: Matthew [name], male    DOB: [birth year].  Matthew [name] is being seen in consultation for management of currently uncontrolled symptomatic diabetes requested by  Wenda Low, MD.   Past Medical History:  Diagnosis Date  . Anticoagulated    plavix  . Arthritis    Shoulder, knees, back   . CAD in native artery cardiologist-  dr Angelena Form   a. CAD/NSTEMI ,  cardiac cath staged stenting-- 12-25-2016  PTCA and DES x3 to prox. and mid LAD;  12-26-2016  PCI to PLA and DES x1 to midRCA,  EF 60-65%.  . Chronic lower back pain   . CKD (chronic kidney disease), stage III ([place])   . Complication of anesthesia    "Too much with shoulder surgery", pt. reports that he was told the at they "lost him, due to absorbing too much anesthesia".  shoulder surgery 1985  . DDD (degenerative disc disease), lumbosacral   . GERD (gastroesophageal reflux disease)   . History of gastric ulcer 01/2017  . History of kidney stones   . History of malignant melanoma    right side of nose  . History of non-ST elevation myocardial infarction (NSTEMI) 12/23/2017   s/p  staged cardiac cath,  s/p  PCI and DEStenting  . Hyperlipidemia   . Hypertension   . IDA (iron deficiency anemia)   . Nocturia   . RBBB   . Renal calculus, right   . S/P drug eluting coronary stent placement 12-25-2017,  12-26-2017   PTCA and DES x3 to prox. and mid LAD;  PCI to PLA and DES x1 to [facility]  . Type 2 diabetes mellitus treated with insulin ([place])    followed by pcp    Past Surgical History:  Procedure Laterality Date  . ANAL FISSURE REPAIR  X 2  . CATARACT EXTRACTION W/ INTRAOCULAR LENS  IMPLANT, BILATERAL Bilateral 2017;  2015  . COLONOSCOPY WITH PROPOFOL N/A 06/05/2016   Procedure: COLONOSCOPY WITH PROPOFOL;  Surgeon: Garlan Fair, MD;  Location: [facility];   Service: Endoscopy;  Laterality: N/A;  . CORONARY ANGIOGRAPHY N/A 12/26/2016   Procedure: CORONARY ANGIOGRAPHY;  Surgeon: Troy Sine, MD;  Location: [place] [facility];  Service: Cardiovascular;  Laterality: N/A;  . CORONARY STENT INTERVENTION N/A 12/25/2016   Procedure: CORONARY STENT INTERVENTION;  Surgeon: Burnell Blanks, MD;  Location: [place] [facility];  Service: Cardiovascular;  Laterality: N/A;  . CORONARY STENT INTERVENTION N/A 12/26/2016   Procedure: CORONARY STENT INTERVENTION;  Surgeon: Troy Sine, MD;  Location: [place] [facility];  Service: Cardiovascular;  Laterality: N/A;  . CYSTOSCOPY/URETEROSCOPY/HOLMIUM LASER/STENT PLACEMENT Right 01/07/2018   Procedure: RIGHT URETEROSCOPY/HOLMIUM LASER/STENT PLACEMENT;  Surgeon: Lucas Mallow, MD;  Location: [facility];  Service: Urology;  Laterality: Right;  . ESOPHAGOGASTRODUODENOSCOPY (EGD) WITH PROPOFOL N/A 01/12/2017   Procedure: ESOPHAGOGASTRODUODENOSCOPY (EGD) WITH PROPOFOL;  Surgeon: Wilford Corner, MD;  Location: [place];  Service: Endoscopy;  Laterality: N/A;  . HAND TENDON SURGERY Right 10-29-2002  dr Burney Gauze @[facility]    right index and thumb  . KNEE ARTHROSCOPY Bilateral 2009-;2010   @[facility]   . LEFT HEART CATH AND CORONARY ANGIOGRAPHY N/A 12/25/2016   Procedure: LEFT HEART CATH AND CORONARY ANGIOGRAPHY;  Surgeon: Burnell Blanks, MD;  Location: [place] [facility];  Service: Cardiovascular;  Laterality: N/A;  . LEFT HEART CATH AND CORONARY ANGIOGRAPHY N/A 10/10/2019   Procedure: LEFT HEART CATH AND CORONARY ANGIOGRAPHY;  Surgeon: Troy Sine, MD;  Location: [place] [facility];  Service: Cardiovascular;  Laterality: N/A;  . LUMBAR LAMINECTOMY/DECOMPRESSION MICRODISCECTOMY N/A 06/09/2015   Procedure: Laminectomy - T12-L1;  Surgeon: Eustace Moore, MD;  Location: [facility];  Service: Neurosurgery;  Laterality: N/A;  Laminectomy - T12-L1  . MEDIAL PARTIAL KNEE REPLACEMENT Bilateral  2009-2010    [facility]   . MELANOMA EXCISION Right    "side of my nose"  . SHOULDER SURGERY Left 1985  . TOTAL SHOULDER ARTHROPLASTY Left 12/06/2012   Procedure: LEFT TOTAL SHOULDER ARTHROPLASTY VERSES A REVERSE TOTAL SHOULDER ARTHROPLASTY;  Surgeon: Augustin Schooling, MD;  Location: [place];  Service: Orthopedics;  Laterality: Left;    Social History   Socioeconomic History  . Marital status: Married    Spouse name: Not on file  . Number of children: Not on file  . Years of education: Not on file  . Highest education level: Not on file  Occupational History  . Not on file  Tobacco Use  . Smoking status: Never Smoker  . Smokeless tobacco: Never Used  Vaping Use  . Vaping Use: Never used  Substance and Sexual Activity  . Alcohol use: No  . Drug use: No  . Sexual activity: Yes  Other Topics Concern  . Not on file  Social History Narrative  . Not on file   Social Determinants of Health   Financial Resource Strain:   . Difficulty of Paying Living Expenses: Not on file  Food Insecurity:   . Worried About Charity fundraiser in the Last Year: Not on file  . Ran Out of Food in the Last Year: Not on file  Transportation Needs:   . Lack of Transportation (Medical): Not on file  . Lack of Transportation (Non-Medical): Not on file  Physical Activity:   . Days of Exercise per Week: Not on file  . Minutes of Exercise per Session: Not on file  Stress:   . Feeling of Stress : Not on file  Social Connections:   . Frequency of Communication with Friends and Family: Not on file  . Frequency of Social Gatherings with Friends and Family: Not on file  . Attends Religious Services: Not on file  . Active Member of Clubs or Organizations: Not on file  . Attends Archivist Meetings: Not on file  . Marital Status: Not on file    Family History  Problem Relation Age of Onset  . CAD Father   . Heart failure Father   . CAD Sister   . Stroke Mother   . Heart failure Mother      Outpatient Encounter Medications as of 11/19/2019  Medication Sig  . acetaminophen (TYLENOL) 325 MG tablet Take 2 tablets (650 mg total) by mouth every 6 (six) hours as needed for mild pain or headache.  . Alogliptin Benzoate 12.5 MG TABS Take 12.5 mg by mouth daily.  Marland Kitchen amLODipine (NORVASC) 10 MG tablet Take 10 mg by mouth daily.  . clopidogrel (PLAVIX) 75 MG tablet Take 1 tablet (  75 mg total) by mouth daily.  . insulin aspart protamine - aspart (NOVOLOG 70/30 FLEXPEN RELION) (70-30) 100 UNIT/ML FlexPen Inject 20 Units into the skin 2 (two) times daily before a meal.  . isosorbide mononitrate (IMDUR) 60 MG 24 hr tablet Take 1.5 tablets (90 mg total) by mouth daily.  . metFORMIN (GLUCOPHAGE XR) 500 MG 24 hr tablet Take 1 tablet (500 mg total) by mouth daily with breakfast.  . metoprolol tartrate (LOPRESSOR) 25 MG tablet TAKE 1 TABLET BY MOUTH TWICE A DAY  . nitroGLYCERIN (NITROSTAT) 0.4 MG SL tablet Place 1 tablet (0.4 mg total) under the tongue every 5 (five) minutes as needed for chest pain.  . Omega-3 Fatty Acids (FISH OIL) 1000 MG CAPS Take 1,000 mg by mouth daily.  . pantoprazole (PROTONIX) 40 MG tablet Take 40 mg by mouth daily.  . TURMERIC PO Take 1,500 mg by mouth 2 (two) times daily.  . Vitamin D, Cholecalciferol, 25 MCG (1000 UT) CAPS Take 1,000 Units by mouth daily.   . [DISCONTINUED] Alogliptin Benzoate 12.5 MG TABS Take 12.5 mg by mouth daily.  . [DISCONTINUED] Evolocumab (REPATHA SURECLICK) 009 MG/ML SOAJ Inject 1 pen into the skin every 14 (fourteen) days.  . [DISCONTINUED] ezetimibe (ZETIA) 10 MG tablet Take 1 tablet (10 mg total) by mouth daily.  . [DISCONTINUED] FERREX 150 150 MG capsule Take 150 mg by mouth daily.    No facility-administered encounter medications on file as of 11/19/2019.    ALLERGIES: Allergies  Allergen Reactions  . Statins Other (See Comments)    Severe stomach pain.  . Oxycodone Itching  . Hydrocodone Other (See Comments)    "messes with my  hearing"  . Codeine Itching  . Morphine And Related Itching    VACCINATION STATUS: Immunization History  Administered Date(s) Administered  . Influenza-Unspecified 10/24/2012  . Moderna SARS-COVID-2 Vaccination 02/07/2019, 03/10/2019    Diabetes He presents for his initial diabetic visit. He has type 2 diabetes mellitus. Onset time: He was diagnosed at approximate age of 37 years. His disease course has been fluctuating. There are no hypoglycemic associated symptoms. Pertinent negatives for hypoglycemia include no confusion, headaches, pallor or seizures. Associated symptoms include blurred vision, polydipsia and polyuria. Pertinent negatives for diabetes include no chest pain, no fatigue, no polyphagia and no weakness. There are no hypoglycemic complications. Symptoms are worsening. Diabetic complications include heart disease and PVD. Risk factors for coronary artery disease include diabetes mellitus, dyslipidemia, male sex, obesity and sedentary lifestyle. Current diabetic treatment includes insulin injections (He is currently on NovoLog 70/30 26 units daily, alogliptin 12.5 mg p.o. daily.). His weight is fluctuating minimally. He is following a generally unhealthy diet. When asked about meal planning, he reported none. He has had a previous visit with a dietitian. He participates in exercise intermittently. His home blood glucose trend is fluctuating minimally. (He did not bring any logs nor meter to review.  His most recent A1c was 10 + percent.) An ACE inhibitor/angiotensin II receptor blocker is not being taken. Eye exam is current.  Hyperlipidemia This is a chronic problem. The current episode started more than 1 year ago. The problem is uncontrolled. Exacerbating diseases include diabetes. Associated symptoms include myalgias. Pertinent negatives include no chest pain or shortness of breath. Current antihyperlipidemic treatment includes bile acid sequestrants. Risk factors for coronary artery  disease include diabetes mellitus, dyslipidemia, hypertension and male sex.     Review of Systems  Constitutional: Negative for chills, fatigue, fever and unexpected weight change.  HENT: Negative for dental problem, mouth sores and trouble swallowing.   Eyes: Positive for blurred vision. Negative for visual disturbance.  Respiratory: Negative for cough, choking, chest tightness, shortness of breath and wheezing.   Cardiovascular: Negative for chest pain, palpitations and leg swelling.  Gastrointestinal: Negative for abdominal distention, abdominal pain, constipation, diarrhea, nausea and vomiting.  Endocrine: Positive for polydipsia and polyuria. Negative for polyphagia.  Genitourinary: Negative for dysuria, flank pain, hematuria and urgency.  Musculoskeletal: Positive for myalgias. Negative for back pain, gait problem and neck pain.  Skin: Negative for pallor, rash and wound.  Neurological: Negative for seizures, syncope, weakness, numbness and headaches.  Psychiatric/Behavioral: Negative for confusion and dysphoric mood.    Objective:    Vitals with BMI 11/19/2019 11/18/2019 10/10/2019  Height 5\' 8"  5\' 8"  -  Weight 196 lbs 3 oz 197 lbs -  BMI 78.29 56.21 -  Systolic 308 657 846  Diastolic 87 74 70  Pulse 60 60 57    BP (!) 161/87   Pulse 60   Ht 5\' 8"  (1.727 m)   Wt 196 lb 3.2 oz (89 kg)   BMI 29.83 kg/m   Wt Readings from Last 3 Encounters:  11/19/19 196 lb 3.2 oz (89 kg)  11/18/19 197 lb (89.4 kg)  10/10/19 203 lb (92.1 kg)     Physical Exam Constitutional:      General: He is not in acute distress.    Appearance: He is well-developed.  HENT:     Head: Normocephalic and atraumatic.  Neck:     Thyroid: No thyromegaly.     Trachea: No tracheal deviation.  Cardiovascular:     Rate and Rhythm: Normal rate.     Pulses:          Dorsalis pedis pulses are 1+ on the right side and 1+ on the left side.       Posterior tibial pulses are 1+ on the right side and 1+ on  the left side.     Heart sounds: Normal heart sounds, S1 normal and S2 normal. No murmur heard.  No gallop.   Pulmonary:     Effort: Pulmonary effort is normal. No respiratory distress.     Breath sounds: No wheezing.  Abdominal:     General: There is no distension.     Tenderness: There is no abdominal tenderness. There is no guarding.  Musculoskeletal:     Right shoulder: No swelling or deformity.     Cervical back: Normal range of motion and neck supple.  Skin:    General: Skin is warm and dry.     Findings: No rash.     Nails: There is no clubbing.  Neurological:     Mental Status: He is alert and oriented to person, place, and time.     Cranial Nerves: No cranial nerve deficit.     Sensory: No sensory deficit.     Gait: Gait normal.     Deep Tendon Reflexes: Reflexes are normal and symmetric.  Psychiatric:        Speech: Speech normal.        Behavior: Behavior normal. Behavior is cooperative.        Thought Content: Thought content normal.        Judgment: Judgment normal.       CMP ( most recent) CMP     Component Value Date/Time   NA 141 10/08/2019 1556   K 4.5 10/08/2019 1556   CL 105 10/08/2019 1556  CO2 27 10/08/2019 1556   GLUCOSE 173 (H) 10/08/2019 1556   GLUCOSE 125 (H) 01/07/2018 0931   BUN 19 10/08/2019 1556   CREATININE 1.17 10/08/2019 1556   CALCIUM 10.1 10/08/2019 1556   PROT 6.6 10/08/2019 1556   ALBUMIN 4.5 10/08/2019 1556   AST 25 10/08/2019 1556   ALT 32 10/08/2019 1556   ALKPHOS 93 10/08/2019 1556   BILITOT 0.6 10/08/2019 1556   GFRNONAA 60 10/08/2019 1556   GFRAA 69 10/08/2019 1556     Diabetic Labs (most recent): Lab Results  Component Value Date   HGBA1C 10.2 09/16/2019   HGBA1C 11.3 (H) 12/26/2016   HGBA1C 8.3 (H) 06/03/2015     Lipid Panel ( most recent) Lipid Panel     Component Value Date/Time   CHOL 143 01/22/2018 1257   TRIG 213 (H) 01/22/2018 1257   HDL 32 (L) 01/22/2018 1257   CHOLHDL 4.5 01/22/2018 1257    CHOLHDL 4.4 12/24/2016 0634   VLDL 13 12/24/2016 0634   LDLCALC 68 01/22/2018 1257   LABVLDL 43 (H) 01/22/2018 1257      No results found for: TSH, FREET4         Assessment & Plan:   1. DM type 2 causing vascular disease ([place])  - Matthew [name] has currently uncontrolled symptomatic type 2 DM since  79 years of age,  with most recent A1c of 10.2 %. Recent labs reviewed. - I had a long discussion with him about the progressive nature of diabetes and the pathology behind its complications. -his diabetes is complicated by coronary artery disease, peripheral arterial disease, and he remains at a high risk for more acute and chronic complications which include CAD, CVA, CKD, retinopathy, and neuropathy. These are all discussed in detail with him.  - I have counseled him on diet  and weight management  by adopting a carbohydrate restricted/protein rich diet. Patient is encouraged to switch to  unprocessed or minimally processed     complex starch and increased protein intake (animal or plant source), fruits, and vegetables. -  he is advised to stick to a routine mealtimes to eat 3 meals  a day and avoid unnecessary snacks ( to snack only to correct hypoglycemia).   - he admits that there is a room for improvement in his food and drink choices. - Suggestion is made for him to avoid simple carbohydrates  from his diet including Cakes, Sweet Desserts, Ice Cream, Soda (diet and regular), Sweet Tea, Candies, Chips, Cookies, Store Bought Juices, Alcohol in Excess of  1-2 drinks a day, Artificial Sweeteners,  Coffee Creamer, and "Sugar-free" Products. This will help patient to have more stable blood glucose profile and potentially avoid unintended weight gain.  - he will be scheduled with Jearld Fenton, RDN, CDE for diabetes education.  - I have approached him with the following individualized plan to manage  his diabetes and patient agrees:   -In light of his chronic glycemic burden, he will  need multiple daily injections of insulin in order for him to achieve and maintain control of diabetes to target.   -In preparation, he is advised to increase his NovoLog 70/30 to 20 units with breakfast and 20 units with supper for premeal blood glucose readings above 90 mg/dl ,  associated with strict monitoring of glucose 4 times a day-before meals and at bedtime. -This patient may benefit from a CGM.  He will be considered for freestyle libre device next visit. - he is warned not  to take insulin without proper monitoring per orders. - Adjustment parameters are given to him for hypo and hyperglycemia in writing. - he is encouraged to call clinic for blood glucose levels less than 70 or above 300 mg /dl. -He would benefit from alogliptin, refill was sent to his [place]. -He does not have contraindication for use of low-dose Metformin.  He reports some minor side effects from regular strength metformin in the past.  I discussed and initiated Metformin 500 mg XR p.o. daily after breakfast. - Specific targets for  A1c;  LDL, HDL,  and Triglycerides were discussed with the patient.  2) Blood Pressure /Hypertension:  his blood pressure is not controlled to target.   he is advised to continue his current medications including metoprolol 25 mg p.o. twice daily, amlodipine 10 mg p.o. daily.   3) Lipids/Hyperlipidemia:   Review of his recent lipid panel showed  controlled  LDL at 68 .  he  Is not on statins, advised to bring his medications for reconciliation next time.  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 daily.  He reports myalgia, mainly due to PAD-see below.  He will be considered for low-dose statin next visit.   4)  Weight/Diet:  Body mass index is 29.83 kg/m.  -  clearly complicating his diabetes care.   he is  a candidate for weight loss. I discussed with him the fact that loss of 5 - 10% of his  current body weight will have the most impact on  his diabetes management.  Exercise, and detailed carbohydrates information provided  -  detailed on discharge instructions.   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  He is referred to vascular surgery for better evaluation and treatment.  6) Chronic Care/Health Maintenance:  -he  Is not  on ACEI/ARB and Statin medications and  is encouraged to initiate and continue to follow up with Ophthalmology, Dentist,  Podiatrist at least yearly or according to recommendations, and advised to   stay away from smoking. I have recommended yearly flu vaccine and pneumonia vaccine at least every 5 years; moderate intensity exercise for up to 150 minutes weekly; and  sleep for at least 7 hours a day.  POCT ABI Results 11/19/19  He is ABI was abnormal today. Right ABI: PAD      left ABI: PAD  Right leg systolic / diastolic: PAD/PAD Left leg systolic / diastolic: PAD/PAD  Arm systolic / diastolic: 782/95 mmHG Patient will be referred to vascular surgery for better evaluation and treatment.  - he is  advised to maintain close follow up with Wenda Low, MD for primary care needs, as well as his other providers for optimal and coordinated care.   - Time spent in this patient care: 80 min, of which > 50% was spent in  counseling  him about his chronically uncontrolled type 2 diabetes, hypertension, hyperlipidemia, peripheral arterial disease and the rest reviewing his blood glucose logs , discussing his hypoglycemia and hyperglycemia episodes, reviewing his current and  previous labs / studies  ( including abstraction from other facilities) and medications  doses and developing a  long term treatment plan based on the latest standards of care/ guidelines; and documenting his care.    Please refer to Patient Instructions for Blood Glucose Monitoring and Insulin/Medications Dosing Guide"  in media tab for additional  information. Please  also refer to " Patient Self Inventory" in the Media  tab for reviewed elements of pertinent patient history.  Matthew [name] participated in the discussions, expressed understanding, and voiced agreement with the above plans.  All questions were answered to his satisfaction. he is encouraged to contact clinic should he have any questions or concerns prior to his return visit.   Follow up plan: - Return in about 2 weeks (around 12/03/2019) for F/U with Meter and Logs Only - no Labs.  Matthew Lloyd, MD [facility] [address] [place], [place] [address] Phone: [phone]  Fax: [phone]    11/19/2019, 4:33 PM  This note was partially dictated with voice recognition software. Similar sounding words can be transcribed inadequately or may not  be corrected upon review.

## 2019-11-21 ENCOUNTER — Other Ambulatory Visit: Payer: Self-pay

## 2019-11-21 DIAGNOSIS — E1159 Type 2 diabetes mellitus with other circulatory complications: Secondary | ICD-10-CM

## 2019-11-25 ENCOUNTER — Ambulatory Visit (HOSPITAL_COMMUNITY): Payer: Medicare Other

## 2019-12-01 ENCOUNTER — Encounter: Payer: Medicare Other | Admitting: Vascular Surgery

## 2019-12-10 ENCOUNTER — Encounter: Payer: Self-pay | Admitting: "Endocrinology

## 2019-12-10 ENCOUNTER — Other Ambulatory Visit: Payer: Self-pay

## 2019-12-10 ENCOUNTER — Ambulatory Visit (INDEPENDENT_AMBULATORY_CARE_PROVIDER_SITE_OTHER): Payer: Medicare Other | Admitting: "Endocrinology

## 2019-12-10 VITALS — BP 140/78 | HR 60 | Ht 68.0 in | Wt 196.0 lb

## 2019-12-10 DIAGNOSIS — I739 Peripheral vascular disease, unspecified: Secondary | ICD-10-CM

## 2019-12-10 DIAGNOSIS — I10 Essential (primary) hypertension: Secondary | ICD-10-CM

## 2019-12-10 DIAGNOSIS — E782 Mixed hyperlipidemia: Secondary | ICD-10-CM | POA: Diagnosis not present

## 2019-12-10 DIAGNOSIS — E1159 Type 2 diabetes mellitus with other circulatory complications: Secondary | ICD-10-CM

## 2019-12-10 MED ORDER — FREESTYLE LIBRE 2 SENSOR MISC: 1.0000 | 3 refills | Status: DC

## 2019-12-10 MED ORDER — FREESTYLE LIBRE 2 READER DEVI: 0 refills | Status: DC

## 2019-12-10 NOTE — Progress Notes (Signed)
12/10/2019, 12:47 PM  Endocrinology follow-up note   Subjective:    Patient ID: Matthew [name], male    DOB: [birth year].  Matthew [name] is being seen in follow up after he was seen in consultation for management of currently uncontrolled symptomatic diabetes requested by  Wenda Low, MD.   Past Medical History:  Diagnosis Date  . Anticoagulated    plavix  . Arthritis    Shoulder, knees, back   . CAD in native artery cardiologist-  dr Angelena Form   a. CAD/NSTEMI ,  cardiac cath staged stenting-- 12-25-2016  PTCA and DES x3 to prox. and mid LAD;  12-26-2016  PCI to PLA and DES x1 to midRCA,  EF 60-65%.  . Chronic lower back pain   . CKD (chronic kidney disease), stage III ([place])   . Complication of anesthesia    "Too much with shoulder surgery", pt. reports that he was told the at they "lost him, due to absorbing too much anesthesia".  shoulder surgery 1985  . DDD (degenerative disc disease), lumbosacral   . GERD (gastroesophageal reflux disease)   . History of gastric ulcer 01/2017  . History of kidney stones   . History of malignant melanoma    right side of nose  . History of non-ST elevation myocardial infarction (NSTEMI) 12/23/2017   s/p  staged cardiac cath,  s/p  PCI and DEStenting  . Hyperlipidemia   . Hypertension   . IDA (iron deficiency anemia)   . Nocturia   . RBBB   . Renal calculus, right   . S/P drug eluting coronary stent placement 12-25-2017,  12-26-2017   PTCA and DES x3 to prox. and mid LAD;  PCI to PLA and DES x1 to [facility]  . Type 2 diabetes mellitus treated with insulin ([place])    followed by pcp    Past Surgical History:  Procedure Laterality Date  . ANAL FISSURE REPAIR  X 2  . CATARACT EXTRACTION W/ INTRAOCULAR LENS  IMPLANT, BILATERAL Bilateral 2017;  2015  . COLONOSCOPY WITH PROPOFOL N/A 06/05/2016   Procedure: COLONOSCOPY WITH PROPOFOL;  Surgeon: Garlan Fair, MD;  Location: [facility];  Service: Endoscopy;   Laterality: N/A;  . CORONARY ANGIOGRAPHY N/A 12/26/2016   Procedure: CORONARY ANGIOGRAPHY;  Surgeon: Troy Sine, MD;  Location: [place] [facility];  Service: Cardiovascular;  Laterality: N/A;  . CORONARY STENT INTERVENTION N/A 12/25/2016   Procedure: CORONARY STENT INTERVENTION;  Surgeon: Burnell Blanks, MD;  Location: [place] [facility];  Service: Cardiovascular;  Laterality: N/A;  . CORONARY STENT INTERVENTION N/A 12/26/2016   Procedure: CORONARY STENT INTERVENTION;  Surgeon: Troy Sine, MD;  Location: [place] [facility];  Service: Cardiovascular;  Laterality: N/A;  . CYSTOSCOPY/URETEROSCOPY/HOLMIUM LASER/STENT PLACEMENT Right 01/07/2018   Procedure: RIGHT URETEROSCOPY/HOLMIUM LASER/STENT PLACEMENT;  Surgeon: Lucas Mallow, MD;  Location: [facility];  Service: Urology;  Laterality: Right;  . ESOPHAGOGASTRODUODENOSCOPY (EGD) WITH PROPOFOL N/A 01/12/2017   Procedure: ESOPHAGOGASTRODUODENOSCOPY (EGD) WITH PROPOFOL;  Surgeon: Wilford Corner, MD;  Location: [place];  Service: Endoscopy;  Laterality: N/A;  . HAND TENDON SURGERY Right 10-29-2002   dr Burney Gauze @[facility]    right index and thumb  . KNEE ARTHROSCOPY Bilateral 2009-;2010   @[facility]   . LEFT HEART CATH AND CORONARY ANGIOGRAPHY N/A 12/25/2016   Procedure: LEFT HEART CATH AND CORONARY ANGIOGRAPHY;  Surgeon: Burnell Blanks, MD;  Location: [place] [facility];  Service: Cardiovascular;  Laterality: N/A;  . LEFT HEART CATH AND CORONARY ANGIOGRAPHY N/A 10/10/2019   Procedure: LEFT HEART CATH AND CORONARY ANGIOGRAPHY;  Surgeon: Troy Sine, MD;  Location: [place] [facility];  Service: Cardiovascular;  Laterality: N/A;  . LUMBAR LAMINECTOMY/DECOMPRESSION MICRODISCECTOMY N/A 06/09/2015   Procedure: Laminectomy - T12-L1;  Surgeon: Eustace Moore, MD;  Location: [place] [facility];  Service: Neurosurgery;  Laterality: N/A;  Laminectomy - T12-L1  . MEDIAL PARTIAL KNEE REPLACEMENT Bilateral 2009-2010    [facility]    . MELANOMA EXCISION Right    "side of my nose"  . SHOULDER SURGERY Left 1985  . TOTAL SHOULDER ARTHROPLASTY Left 12/06/2012   Procedure: LEFT TOTAL SHOULDER ARTHROPLASTY VERSES A REVERSE TOTAL SHOULDER ARTHROPLASTY;  Surgeon: Augustin Schooling, MD;  Location: [place];  Service: Orthopedics;  Laterality: Left;    Social History   Socioeconomic History  . Marital status: Married    Spouse name: Not on file  . Number of children: Not on file  . Years of education: Not on file  . Highest education level: Not on file  Occupational History  . Not on file  Tobacco Use  . Smoking status: Never Smoker  . Smokeless tobacco: Never Used  Vaping Use  . Vaping Use: Never used  Substance and Sexual Activity  . Alcohol use: No  . Drug use: No  . Sexual activity: Yes  Other Topics Concern  . Not on file  Social History Narrative  . Not on file   Social Determinants of Health   Financial Resource Strain:   . Difficulty of Paying Living Expenses: Not on file  Food Insecurity:   . Worried About Charity fundraiser in the Last Year: Not on file  . Ran Out of Food in the Last Year: Not on file  Transportation Needs:   . Lack of Transportation (Medical): Not on file  . Lack of Transportation (Non-Medical): Not on file  Physical Activity:   . Days of Exercise per Week: Not on file  . Minutes of Exercise per Session: Not on file  Stress:   . Feeling of Stress : Not on file  Social Connections:   . Frequency of Communication with Friends and Family: Not on file  . Frequency of Social Gatherings with Friends and Family: Not on file  . Attends Religious Services: Not on file  . Active Member of Clubs or Organizations: Not on file  . Attends Archivist Meetings: Not on file  . Marital Status: Not on file    Family History  Problem Relation Age of Onset  . CAD Father   . Heart failure Father   . CAD Sister   . Stroke Mother   . Heart failure Mother     Outpatient Encounter  Medications as of 12/10/2019  Medication Sig  . Ascorbic Acid (VITAMIN C PO) Take 1,000 mg by mouth daily.  . Zinc 50 MG CAPS Take 1 capsule by mouth daily.  Marland Kitchen acetaminophen (TYLENOL) 325 MG tablet Take 2 tablets (650 mg total) by mouth every 6 (six) hours as needed for mild pain or headache.  . Alogliptin Benzoate 12.5 MG TABS Take 12.5 mg by mouth daily.  Marland Kitchen amLODipine (NORVASC) 10 MG tablet Take 10 mg by mouth daily.  Marland Kitchen amoxicillin (AMOXIL) 500 MG capsule Take 4 capsules by mouth as needed. Take 4 capsules 1 hour prior to dental work  . clopidogrel (PLAVIX) 75 MG tablet Take 1 tablet (75 mg total) by mouth  daily.  . Continuous Blood Gluc Receiver (FREESTYLE LIBRE 2 READER) DEVI As directed  . Continuous Blood Gluc Sensor (FREESTYLE LIBRE 2 SENSOR) MISC 1 Piece by Does not apply route every 14 (fourteen) days.  . insulin aspart protamine - aspart (NOVOLOG 70/30 FLEXPEN RELION) (70-30) 100 UNIT/ML FlexPen Inject 10 Units into the skin 2 (two) times daily before a meal.  . isosorbide mononitrate (IMDUR) 60 MG 24 hr tablet Take 1.5 tablets (90 mg total) by mouth daily.  . metFORMIN (GLUCOPHAGE XR) 500 MG 24 hr tablet Take 1 tablet (500 mg total) by mouth daily with breakfast.  . metoprolol tartrate (LOPRESSOR) 25 MG tablet TAKE 1 TABLET BY MOUTH TWICE A DAY  . nitroGLYCERIN (NITROSTAT) 0.4 MG SL tablet Place 1 tablet (0.4 mg total) under the tongue every 5 (five) minutes as needed for chest pain.  . Omega-3 Fatty Acids (FISH OIL) 1000 MG CAPS Take 1,000 mg by mouth daily.  . pantoprazole (PROTONIX) 40 MG tablet Take 40 mg by mouth daily.  . TURMERIC PO Take 1,500 mg by mouth 2 (two) times daily.  . Vitamin D, Cholecalciferol, 25 MCG (1000 UT) CAPS Take 1,000 Units by mouth daily.    No facility-administered encounter medications on file as of 12/10/2019.    ALLERGIES: Allergies  Allergen Reactions  . Statins Other (See Comments)    Severe stomach pain.  . Oxycodone Itching  . Hydrocodone  Other (See Comments)    "messes with my hearing"  . Codeine Itching  . Morphine And Related Itching    VACCINATION STATUS: Immunization History  Administered Date(s) Administered  . Influenza-Unspecified 10/24/2012  . Moderna SARS-COVID-2 Vaccination 02/07/2019, 03/10/2019    Diabetes He presents for his follow-up diabetic visit. He has type 2 diabetes mellitus. Onset time: He was diagnosed at approximate age of 35 years. His disease course has been improving. There are no hypoglycemic associated symptoms. Pertinent negatives for hypoglycemia include no confusion, headaches, pallor or seizures. Pertinent negatives for diabetes include no blurred vision, no chest pain, no fatigue, no polydipsia, no polyphagia, no polyuria and no weakness. There are no hypoglycemic complications. Symptoms are improving. Diabetic complications include heart disease and PVD. Risk factors for coronary artery disease include diabetes mellitus, dyslipidemia, male sex, obesity and sedentary lifestyle. Current diabetic treatment includes insulin injections (He is currently on NovoLog 70/30 26 units daily, alogliptin 12.5 mg p.o. daily.). His weight is fluctuating minimally. He is following a generally unhealthy diet. When asked about meal planning, he reported none. He has had a previous visit with a dietitian. He participates in exercise intermittently. His home blood glucose trend is decreasing steadily. His breakfast blood glucose range is generally 140-180 mg/dl. His lunch blood glucose range is generally 140-180 mg/dl. His dinner blood glucose range is generally 140-180 mg/dl. His bedtime blood glucose range is generally 140-180 mg/dl. His overall blood glucose range is 140-180 mg/dl. (He presents with significantly improved glycemic profile with some mild , rare  Hypoglycemia. He has tolerated his metformin and alogliptin. His  most recent A1c was 10 + %.) An ACE inhibitor/angiotensin II receptor blocker is not being  taken. Eye exam is current.  Hyperlipidemia This is a chronic problem. The current episode started more than 1 year ago. The problem is uncontrolled. Exacerbating diseases include diabetes. Associated symptoms include myalgias. Pertinent negatives include no chest pain or shortness of breath. Current antihyperlipidemic treatment includes bile acid sequestrants. Risk factors for coronary artery disease include diabetes mellitus, dyslipidemia, hypertension and male sex.  Review of Systems  Constitutional: Negative for chills, fatigue, fever and unexpected weight change.  HENT: Negative for dental problem, mouth sores and trouble swallowing.   Eyes: Negative for blurred vision and visual disturbance.  Respiratory: Negative for cough, choking, chest tightness, shortness of breath and wheezing.   Cardiovascular: Negative for chest pain, palpitations and leg swelling.  Gastrointestinal: Negative for abdominal distention, abdominal pain, constipation, diarrhea, nausea and vomiting.  Endocrine: Negative for polydipsia, polyphagia and polyuria.  Genitourinary: Negative for dysuria, flank pain, hematuria and urgency.  Musculoskeletal: Positive for myalgias. Negative for back pain, gait problem and neck pain.  Skin: Negative for pallor, rash and wound.  Neurological: Negative for seizures, syncope, weakness, numbness and headaches.  Psychiatric/Behavioral: Negative for confusion and dysphoric mood.    Objective:    Vitals with BMI 12/10/2019 11/19/2019 11/18/2019  Height 5\' 8"  5\' 8"  5\' 8"   Weight 196 lbs 196 lbs 3 oz 197 lbs  BMI 29.81 09.38 18.29  Systolic 937 169 678  Diastolic 78 87 74  Pulse 60 60 60    BP 140/78   Pulse 60   Ht 5\' 8"  (1.727 m)   Wt 196 lb (88.9 kg)   BMI 29.80 kg/m   Wt Readings from Last 3 Encounters:  12/10/19 196 lb (88.9 kg)  11/19/19 196 lb 3.2 oz (89 kg)  11/18/19 197 lb (89.4 kg)     Physical Exam Constitutional:      General: He is not in acute  distress.    Appearance: He is well-developed.  HENT:     Head: Normocephalic and atraumatic.  Neck:     Thyroid: No thyromegaly.     Trachea: No tracheal deviation.  Cardiovascular:     Rate and Rhythm: Normal rate.     Pulses:          Dorsalis pedis pulses are 1+ on the right side and 1+ on the left side.       Posterior tibial pulses are 1+ on the right side and 1+ on the left side.     Heart sounds: Normal heart sounds, S1 normal and S2 normal. No murmur heard.  No gallop.   Pulmonary:     Effort: Pulmonary effort is normal. No respiratory distress.     Breath sounds: No wheezing.  Abdominal:     General: There is no distension.     Tenderness: There is no abdominal tenderness. There is no guarding.  Musculoskeletal:     Right shoulder: No swelling or deformity.     Cervical back: Normal range of motion and neck supple.  Skin:    General: Skin is warm and dry.     Findings: No rash.     Nails: There is no clubbing.  Neurological:     Mental Status: He is alert and oriented to person, place, and time.     Cranial Nerves: No cranial nerve deficit.     Sensory: No sensory deficit.     Gait: Gait normal.     Deep Tendon Reflexes: Reflexes are normal and symmetric.  Psychiatric:        Speech: Speech normal.        Behavior: Behavior normal. Behavior is cooperative.        Thought Content: Thought content normal.        Judgment: Judgment normal.     CMP ( most recent) CMP     Component Value Date/Time   NA 141 10/08/2019 1556   K 4.5  10/08/2019 1556   CL 105 10/08/2019 1556   CO2 27 10/08/2019 1556   GLUCOSE 173 (H) 10/08/2019 1556   GLUCOSE 125 (H) 01/07/2018 0931   BUN 19 10/08/2019 1556   CREATININE 1.17 10/08/2019 1556   CALCIUM 10.1 10/08/2019 1556   PROT 6.6 10/08/2019 1556   ALBUMIN 4.5 10/08/2019 1556   AST 25 10/08/2019 1556   ALT 32 10/08/2019 1556   ALKPHOS 93 10/08/2019 1556   BILITOT 0.6 10/08/2019 1556   GFRNONAA 60 10/08/2019 1556   GFRAA  69 10/08/2019 1556     Diabetic Labs (most recent): Lab Results  Component Value Date   HGBA1C 10.2 09/16/2019   HGBA1C 11.3 (H) 12/26/2016   HGBA1C 8.3 (H) 06/03/2015     Lipid Panel ( most recent) Lipid Panel     Component Value Date/Time   CHOL 143 01/22/2018 1257   TRIG 213 (H) 01/22/2018 1257   HDL 32 (L) 01/22/2018 1257   CHOLHDL 4.5 01/22/2018 1257   CHOLHDL 4.4 12/24/2016 0634   VLDL 13 12/24/2016 0634   LDLCALC 68 01/22/2018 1257   LABVLDL 43 (H) 01/22/2018 1257       Assessment & Plan:   1. DM type 2 causing vascular disease ([place])  - Matthew [name] has currently uncontrolled symptomatic type 2 DM since  79 years of age.  He presents with significantly improved glycemic profile with some mild , rare  Hypoglycemia. He has tolerated his metformin and alogliptin. His  most recent A1c was 10 + %.   Recent labs reviewed. - I had a long discussion with him about the progressive nature of diabetes and the pathology behind its complications. -his diabetes is complicated by coronary artery disease, peripheral arterial disease, and he remains at a high risk for more acute and chronic complications which include CAD, CVA, CKD, retinopathy, and neuropathy. These are all discussed in detail with him.  - I have counseled him on diet  and weight management  by adopting a carbohydrate restricted/protein rich diet. Patient is encouraged to switch to  unprocessed or minimally processed     complex starch and increased protein intake (animal or plant source), fruits, and vegetables. -  he is advised to stick to a routine mealtimes to eat 3 meals  a day and avoid unnecessary snacks ( to snack only to correct hypoglycemia).   - he  admits there is a room for improvement in his diet and drink choices. -  Suggestion is made for him to avoid simple carbohydrates  from his diet including Cakes, Sweet Desserts / Pastries, Ice Cream, Soda (diet and regular), Sweet Tea, Candies, Chips,  Cookies, Sweet Pastries,  Store Bought Juices, Alcohol in Excess of  1-2 drinks a day, Artificial Sweeteners, Coffee Creamer, and "Sugar-free" Products. This will help patient to have stable blood glucose profile and potentially avoid unintended weight gain.   - he will be scheduled with Jearld Fenton, RDN, CDE for diabetes education.  - I have approached him with the following individualized plan to manage  his diabetes and patient agrees:   -In light of his chronic glycemic burden, he will continue to need multiple daily injections of insulin in order for him to achieve and maintain control of diabetes to target.    -He has responded and tolerated the oral medications.  He is advised to lower his NovoLog 70/30 to 10 units with breakfast and 10 units with supper  for premeal blood glucose readings above 90 mg/dl ,  associated with strict monitoring of glucose 4 times a day-before meals and at bedtime. -This patient may benefit from a CGM.  I discussed and prescribed the freestyle libre device for him.   - he is warned not to take insulin without proper monitoring per orders. - Adjustment parameters are given to him for hypo and hyperglycemia in writing. - he is encouraged to call clinic for blood glucose levels less than 70 or above 300 mg /dl. -He is advised to continue alogliptin 12.5 mg p.o. daily with breakfast and Metformin 500 mg XR p.o. daily after breakfast.   - Specific targets for  A1c;  LDL, HDL,  and Triglycerides were discussed with the patient.  2) Blood Pressure /Hypertension:  his blood pressure is controlled to target.    he is advised to continue his current medications including metoprolol 25 mg p.o. twice daily, amlodipine 10 mg p.o. daily.   3) Lipids/Hyperlipidemia:   Review of his recent lipid panel showed  controlled  LDL at 68 .  he  Is not on statins, advised to bring his medications for reconciliation next time.  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 daily.  He reports myalgia, mainly due to PAD-see below.  He will be considered for low-dose statin next visit.   4)  Weight/Diet:  Body mass index is 29.8 kg/m.  -  clearly complicating his diabetes care.   he is  a candidate for weight loss. I discussed with him the fact that loss of 5 - 10% of his  current body weight will have the most impact on his diabetes management.  Exercise, and detailed carbohydrates information provided  -  detailed on discharge instructions.   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  He is referred to vascular surgery for better evaluation and treatment.  6) Chronic Care/Health Maintenance:  -he  Is not  on ACEI/ARB and Statin medications and  is encouraged to initiate and continue to follow up with Ophthalmology, Dentist,  Podiatrist at least yearly or according to recommendations, and advised to   stay away from smoking. I have recommended yearly flu vaccine and pneumonia vaccine at least every 5 years; moderate intensity exercise for up to 150 minutes weekly; and  sleep for at least 7 hours a day.  POCT ABI Results He has had abnormal ABI during last visit.  Right ABI: PAD      left ABI: PAD  Right leg systolic / diastolic: PAD/PAD Left leg systolic / diastolic: PAD/PAD  Arm systolic / diastolic: 629/52 mmHG -Patient has been referred to vascular surgery for better evaluation and treatment.  - he is  advised to maintain close follow up with Wenda Low, MD for primary care needs, as well as his other providers for optimal and coordinated care.   - Time spent on this patient care encounter:  35 min, of which > 50% was spent in  counseling and the rest reviewing his blood glucose logs , discussing his hypoglycemia and hyperglycemia episodes, reviewing his current and  previous labs / studies  ( including abstraction from other  facilities) and medications  doses and developing a  long term treatment plan and documenting his care.   Please refer to Patient Instructions for Blood Glucose Monitoring and Insulin/Medications Dosing Guide"  in media tab for additional information. Please  also refer to " Patient Self Inventory"  in the Media  tab for reviewed elements of pertinent patient history.  Matthew [name] participated in the discussions, expressed understanding, and voiced agreement with the above plans.  All questions were answered to his satisfaction. he is encouraged to contact clinic should he have any questions or concerns prior to his return visit.    Follow up plan: - Return in about 5 weeks (around 01/14/2020) for Bring Meter and Logs- A1c in Office, Urine MA - [place].  Glade Lloyd, MD [facility] [address] [place], [place] [address] Phone: [phone]  Fax: [phone]    12/10/2019, 12:47 PM  This note was partially dictated with voice recognition software. Similar sounding words can be transcribed inadequately or may not  be corrected upon review.

## 2019-12-10 NOTE — Patient Instructions (Signed)
                                     Advice for Weight Management  -For most of us the best way to lose weight is by diet management. Generally speaking, diet management means consuming less calories intentionally which over time brings about progressive weight loss.  This can be achieved more effectively by restricting carbohydrate consumption to the minimum possible.  So, it is critically important to know your numbers: how much calorie you are consuming and how much calorie you need. More importantly, our carbohydrates sources should be unprocessed or minimally processed complex starch food items.   Sometimes, it is important to balance nutrition by increasing protein intake (animal or plant source), fruits, and vegetables.  -Sticking to a routine mealtime to eat 3 meals a day and avoiding unnecessary snacks is shown to have a big role in weight control. Under normal circumstances, the only time we lose real weight is when we are hungry, so allow hunger to take place- hunger means no food between meal times, only water.  It is not advisable to starve.   -It is better to avoid simple carbohydrates including: Cakes, Sweet Desserts, Ice Cream, Soda (diet and regular), Sweet Tea, Candies, Chips, Cookies, Store Bought Juices, Alcohol in Excess of  1-2 drinks a day, Artificial Sweeteners, Doughnuts, Coffee Creamers, "Sugar-free" Products, etc, etc.  This is not a complete list.....    -Consulting with certified diabetes educators is proven to provide you with the most accurate and current information on diet.  Also, you may be  interested in discussing diet options/exchanges , we can schedule a visit with Matthew [name], RDN, CDE for individualized nutrition education.  -Exercise: If you are able: 30 -60 minutes a day ,4 days a week, or 150 minutes a week.  The longer the better.  Combine stretch, strength, and aerobic activities.  If you were told in the past that you  have high risk for cardiovascular diseases, you may seek evaluation by your heart doctor prior to initiating moderate to intense exercise programs.                                  Additional Care Considerations for Diabetes   -Diabetes  is a chronic disease.  The most important care consideration is regular follow-up with your diabetes care provider with the goal being avoiding or delaying its complications and to take advantage of advances in medications and technology.    -Type 2 diabetes is known to coexist with other important comorbidities such as high blood pressure and high cholesterol.  It is critical to control not only the diabetes but also the high blood pressure and high cholesterol to minimize and delay the risk of complications including coronary artery disease, stroke, amputations, blindness, etc.    - Studies showed that people with diabetes will benefit from a class of medications known as ACE inhibitors and statins.  Unless there are specific reasons not to be on these medications, the standard of care is to consider getting one from these groups of medications at an optimal doses.  These medications are generally considered safe and proven to help protect the heart and the kidneys.    - People with diabetes are encouraged to initiate and maintain regular follow-up with eye doctors, foot doctors, dentists ,   and if necessary heart and kidney doctors.     - It is highly recommended that people with diabetes quit smoking or stay away from smoking, and get yearly  flu vaccine and pneumonia vaccine at least every 5 years.  One other important lifestyle recommendation is to ensure adequate sleep - at least 6-7 hours of uninterrupted sleep at night.  -Exercise: If you are able: 30 -60 minutes a day, 4 days a week, or 150 minutes a week.  The longer the better.  Combine stretch, strength, and aerobic activities.  If you were told in the past that you have high risk for cardiovascular  diseases, you may seek evaluation by your heart doctor prior to initiating moderate to intense exercise programs.          

## 2020-01-14 ENCOUNTER — Ambulatory Visit (INDEPENDENT_AMBULATORY_CARE_PROVIDER_SITE_OTHER): Payer: Medicare Other | Admitting: "Endocrinology

## 2020-01-14 ENCOUNTER — Encounter: Payer: Self-pay | Admitting: "Endocrinology

## 2020-01-14 ENCOUNTER — Other Ambulatory Visit: Payer: Self-pay

## 2020-01-14 VITALS — BP 140/88 | HR 64 | Ht 68.0 in | Wt 195.5 lb

## 2020-01-14 DIAGNOSIS — E782 Mixed hyperlipidemia: Secondary | ICD-10-CM | POA: Diagnosis not present

## 2020-01-14 DIAGNOSIS — I739 Peripheral vascular disease, unspecified: Secondary | ICD-10-CM

## 2020-01-14 DIAGNOSIS — I10 Essential (primary) hypertension: Secondary | ICD-10-CM | POA: Diagnosis not present

## 2020-01-14 DIAGNOSIS — E1159 Type 2 diabetes mellitus with other circulatory complications: Secondary | ICD-10-CM

## 2020-01-14 LAB — POCT UA - MICROALBUMIN
Creatinine, POC: 100 mg/dL
Microalbumin Ur, POC: 150 mg/L

## 2020-01-14 LAB — POCT GLYCOSYLATED HEMOGLOBIN (HGB A1C): HbA1c, POC (controlled diabetic range): 6.4 % (ref 0.0–7.0)

## 2020-01-14 MED ORDER — FREESTYLE LIBRE 2 READER DEVI: 0 refills | Status: DC

## 2020-01-14 MED ORDER — LOSARTAN POTASSIUM 50 MG PO TABS: 50.0000 mg | ORAL_TABLET | Freq: Every day | ORAL | 1 refills | Status: AC

## 2020-01-14 NOTE — Progress Notes (Signed)
01/14/2020, 5:38 PM  Endocrinology follow-up note   Subjective:    Patient ID: Matthew [name], male    DOB: [birth year].  Matthew [name] is being seen in follow up after he was seen in consultation for management of currently uncontrolled symptomatic diabetes requested by  Wenda Low, MD.   Past Medical History:  Diagnosis Date  . Anticoagulated    plavix  . Arthritis    Shoulder, knees, back   . CAD in native artery cardiologist-  dr Angelena Form   a. CAD/NSTEMI ,  cardiac cath staged stenting-- 12-25-2016  PTCA and DES x3 to prox. and mid LAD;  12-26-2016  PCI to PLA and DES x1 to midRCA,  EF 60-65%.  . Chronic lower back pain   . CKD (chronic kidney disease), stage III ([place])   . Complication of anesthesia    "Too much with shoulder surgery", pt. reports that he was told the at they "lost him, due to absorbing too much anesthesia".  shoulder surgery 1985  . DDD (degenerative disc disease), lumbosacral   . GERD (gastroesophageal reflux disease)   . History of gastric ulcer 01/2017  . History of kidney stones   . History of malignant melanoma    right side of nose  . History of non-ST elevation myocardial infarction (NSTEMI) 12/23/2017   s/p  staged cardiac cath,  s/p  PCI and DEStenting  . Hyperlipidemia   . Hypertension   . IDA (iron deficiency anemia)   . Nocturia   . RBBB   . Renal calculus, right   . S/P drug eluting coronary stent placement 12-25-2017,  12-26-2017   PTCA and DES x3 to prox. and mid LAD;  PCI to PLA and DES x1 to [facility]  . Type 2 diabetes mellitus treated with insulin ([place])    followed by pcp    Past Surgical History:  Procedure Laterality Date  . ANAL FISSURE REPAIR  X 2  . CATARACT EXTRACTION W/ INTRAOCULAR LENS  IMPLANT, BILATERAL Bilateral 2017;  2015  . COLONOSCOPY WITH PROPOFOL N/A 06/05/2016   Procedure: COLONOSCOPY WITH PROPOFOL;  Surgeon: Garlan Fair, MD;  Location: [facility];  Service: Endoscopy;   Laterality: N/A;  . CORONARY ANGIOGRAPHY N/A 12/26/2016   Procedure: CORONARY ANGIOGRAPHY;  Surgeon: [place] Sine, MD;  Location: [place] [facility];  Service: Cardiovascular;  Laterality: N/A;  . CORONARY STENT INTERVENTION N/A 12/25/2016   Procedure: CORONARY STENT INTERVENTION;  Surgeon: Burnell Blanks, MD;  Location: [place] [facility];  Service: Cardiovascular;  Laterality: N/A;  . CORONARY STENT INTERVENTION N/A 12/26/2016   Procedure: CORONARY STENT INTERVENTION;  Surgeon: [place] Sine, MD;  Location: [place] [facility];  Service: Cardiovascular;  Laterality: N/A;  . CYSTOSCOPY/URETEROSCOPY/HOLMIUM LASER/STENT PLACEMENT Right 01/07/2018   Procedure: RIGHT URETEROSCOPY/HOLMIUM LASER/STENT PLACEMENT;  Surgeon: Lucas Mallow, MD;  Location: [facility];  Service: Urology;  Laterality: Right;  . ESOPHAGOGASTRODUODENOSCOPY (EGD) WITH PROPOFOL N/A 01/12/2017   Procedure: ESOPHAGOGASTRODUODENOSCOPY (EGD) WITH PROPOFOL;  Surgeon: Wilford Corner, MD;  Location: [place];  Service: Endoscopy;  Laterality: N/A;  . HAND TENDON SURGERY Right 10-29-2002   dr Burney Gauze @[facility]    right index and thumb  . KNEE ARTHROSCOPY Bilateral 2009-;2010   @[facility]   . LEFT HEART CATH AND CORONARY ANGIOGRAPHY N/A 12/25/2016   Procedure: LEFT HEART CATH AND CORONARY ANGIOGRAPHY;  Surgeon: [place]ell Blanks, MD;  Location: [place] [facility];  Service: Cardiovascular;  Laterality: N/A;  . LEFT HEART CATH AND CORONARY ANGIOGRAPHY N/A 10/10/2019   Procedure: LEFT HEART CATH AND CORONARY ANGIOGRAPHY;  Surgeon: [place] Sine, MD;  Location: [place] [facility];  Service: Cardiovascular;  Laterality: N/A;  . LUMBAR LAMINECTOMY/DECOMPRESSION MICRODISCECTOMY N/A 06/09/2015   Procedure: Laminectomy - T12-L1;  Surgeon: Eustace Moore, MD;  Location: [place] [facility];  Service: Neurosurgery;  Laterality: N/A;  Laminectomy - T12-L1  . MEDIAL PARTIAL KNEE REPLACEMENT Bilateral 2009-2010    [facility]    . MELANOMA EXCISION Right    "side of my nose"  . SHOULDER SURGERY Left 1985  . TOTAL SHOULDER ARTHROPLASTY Left 12/06/2012   Procedure: LEFT TOTAL SHOULDER ARTHROPLASTY VERSES A REVERSE TOTAL SHOULDER ARTHROPLASTY;  Surgeon: Augustin Schooling, MD;  Location: [place];  Service: Orthopedics;  Laterality: Left;    Social History   Socioeconomic History  . Marital status: Married    Spouse name: Not on file  . Number of children: Not on file  . Years of education: Not on file  . Highest education level: Not on file  Occupational History  . Not on file  Tobacco Use  . Smoking status: Never Smoker  . Smokeless tobacco: Never Used  Vaping Use  . Vaping Use: Never used  Substance and Sexual Activity  . Alcohol use: No  . Drug use: No  . Sexual activity: Yes  Other Topics Concern  . Not on file  Social History Narrative  . Not on file   Social Determinants of Health   Financial Resource Strain:   . Difficulty of Paying Living Expenses: Not on file  Food Insecurity:   . Worried About Charity fundraiser in the Last Year: Not on file  . Ran Out of Food in the Last Year: Not on file  Transportation Needs:   . Lack of Transportation (Medical): Not on file  . Lack of Transportation (Non-Medical): Not on file  Physical Activity:   . Days of Exercise per Week: Not on file  . Minutes of Exercise per Session: Not on file  Stress:   . Feeling of Stress : Not on file  Social Connections:   . Frequency of Communication with Friends and Family: Not on file  . Frequency of Social Gatherings with Friends and Family: Not on file  . Attends Religious Services: Not on file  . Active Member of Clubs or Organizations: Not on file  . Attends Archivist Meetings: Not on file  . Marital Status: Not on file    Family History  Problem Relation Age of Onset  . CAD Father   . Heart failure Father   . CAD Sister   . Stroke Mother   . Heart failure Mother     Outpatient Encounter  Medications as of 01/14/2020  Medication Sig  . acetaminophen (TYLENOL) 325 MG tablet Take 2 tablets (650 mg total) by mouth every 6 (six) hours as needed for mild pain or headache.  . Alogliptin Benzoate 12.5 MG TABS Take 12.5 mg by mouth daily.  Marland Kitchen amoxicillin (AMOXIL) 500 MG capsule Take 4 capsules by mouth as needed. Take 4 capsules 1 hour prior to dental work  . Ascorbic Acid (VITAMIN C PO) Take 1,000 mg by mouth daily.  . clopidogrel (PLAVIX) 75 MG tablet Take 1 tablet (75 mg total) by mouth daily.  . Continuous Blood Gluc Receiver (FREESTYLE LIBRE 2 READER) DEVI As directed  . Continuous Blood Gluc Sensor (FREESTYLE LIBRE 2 SENSOR) MISC  1 Piece by Does not apply route every 14 (fourteen) days.  . insulin aspart protamine - aspart (NOVOLOG 70/30 FLEXPEN RELION) (70-30) 100 UNIT/ML FlexPen Inject 10 Units into the skin 2 (two) times daily before a meal.  . isosorbide mononitrate (IMDUR) 60 MG 24 hr tablet Take 1.5 tablets (90 mg total) by mouth daily.  Marland Kitchen losartan (COZAAR) 50 MG tablet Take 1 tablet (50 mg total) by mouth daily.  . metFORMIN (GLUCOPHAGE XR) 500 MG 24 hr tablet Take 1 tablet (500 mg total) by mouth daily with breakfast.  . metoprolol tartrate (LOPRESSOR) 25 MG tablet TAKE 1 TABLET BY MOUTH TWICE A DAY  . nitroGLYCERIN (NITROSTAT) 0.4 MG SL tablet Place 1 tablet (0.4 mg total) under the tongue every 5 (five) minutes as needed for chest pain.  . Omega-3 Fatty Acids (FISH OIL) 1000 MG CAPS Take 1,000 mg by mouth daily.  . pantoprazole (PROTONIX) 40 MG tablet Take 40 mg by mouth daily.  . TURMERIC PO Take 1,500 mg by mouth 2 (two) times daily.  . Vitamin D, Cholecalciferol, 25 MCG (1000 UT) CAPS Take 1,000 Units by mouth daily.   . Zinc 50 MG CAPS Take 1 capsule by mouth daily.  . [DISCONTINUED] amLODipine (NORVASC) 10 MG tablet Take 10 mg by mouth daily.  . [DISCONTINUED] Continuous Blood Gluc Receiver (FREESTYLE LIBRE 2 READER) DEVI As directed   No facility-administered  encounter medications on file as of 01/14/2020.    ALLERGIES: Allergies  Allergen Reactions  . Statins Other (See Comments)    Severe stomach pain.  . Oxycodone Itching  . Hydrocodone Other (See Comments)    "messes with my hearing"  . Codeine Itching  . Morphine And Related Itching    VACCINATION STATUS: Immunization History  Administered Date(s) Administered  . Influenza-Unspecified 10/24/2012  . Moderna SARS-COVID-2 Vaccination 02/07/2019, 03/10/2019    Diabetes He presents for his follow-up diabetic visit. He has type 2 diabetes mellitus. Onset time: He was diagnosed at approximate age of 58 years. His disease course has been improving. There are no hypoglycemic associated symptoms. Pertinent negatives for hypoglycemia include no confusion, headaches, pallor or seizures. Pertinent negatives for diabetes include no blurred vision, no chest pain, no fatigue, no polydipsia, no polyphagia, no polyuria and no weakness. There are no hypoglycemic complications. Symptoms are improving. Diabetic complications include heart disease and PVD. Risk factors for coronary artery disease include diabetes mellitus, dyslipidemia, male sex, obesity and sedentary lifestyle. Current diabetic treatment includes insulin injections (He is currently on NovoLog 70/30 26 units daily, alogliptin 12.5 mg p.o. daily.). His weight is fluctuating minimally. He is following a generally unhealthy diet. When asked about meal planning, he reported none. He has had a previous visit with a dietitian. He participates in exercise intermittently. His home blood glucose trend is decreasing steadily. His breakfast blood glucose range is generally 130-140 mg/dl. His bedtime blood glucose range is generally 130-140 mg/dl. His overall blood glucose range is 130-140 mg/dl. (He presents with significant improvement in his glycemic profile both fasting and postprandial.  He has no hypoglycemia.  His point-of-care A1c 6.4%, improving from  10.9%.   ) An ACE inhibitor/angiotensin II receptor blocker is not being taken. Eye exam is current.  Hyperlipidemia This is a chronic problem. The current episode started more than 1 year ago. The problem is uncontrolled. Exacerbating diseases include diabetes. Associated symptoms include myalgias. Pertinent negatives include no chest pain or shortness of breath. Current antihyperlipidemic treatment includes bile acid sequestrants. Risk factors  for coronary artery disease include diabetes mellitus, dyslipidemia, hypertension and male sex.     Review of Systems  Constitutional: Negative for chills, fatigue, fever and unexpected weight change.  HENT: Negative for dental problem, mouth sores and trouble swallowing.   Eyes: Negative for blurred vision and visual disturbance.  Respiratory: Negative for cough, choking, chest tightness, shortness of breath and wheezing.   Cardiovascular: Negative for chest pain, palpitations and leg swelling.  Gastrointestinal: Negative for abdominal distention, abdominal pain, constipation, diarrhea, nausea and vomiting.  Endocrine: Negative for polydipsia, polyphagia and polyuria.  Genitourinary: Negative for dysuria, flank pain, hematuria and urgency.  Musculoskeletal: Positive for myalgias. Negative for back pain, gait problem and neck pain.  Skin: Negative for pallor, rash and wound.  Neurological: Negative for seizures, syncope, weakness, numbness and headaches.  Psychiatric/Behavioral: Negative for confusion and dysphoric mood.    Objective:    Vitals with BMI 01/14/2020 12/10/2019 11/19/2019  Height 5\' 8"  5\' 8"  5\' 8"   Weight 195 lbs 8 oz 196 lbs 196 lbs 3 oz  BMI 29.73 45.03 88.82  Systolic 800 349 179  Diastolic 88 78 87  Pulse 64 60 60    BP 140/88   Pulse 64   Ht 5\' 8"  (1.727 m)   Wt 195 lb 8 oz (88.7 kg)   BMI 29.73 kg/m   Wt Readings from Last 3 Encounters:  01/14/20 195 lb 8 oz (88.7 kg)  12/10/19 196 lb (88.9 kg)  11/19/19 196 lb 3.2  oz (89 kg)     Physical Exam Constitutional:      General: He is not in acute distress.    Appearance: He is well-developed.  HENT:     Head: Normocephalic and atraumatic.  Neck:     Thyroid: No thyromegaly.     Trachea: No tracheal deviation.  Cardiovascular:     Rate and Rhythm: Normal rate.     Pulses:          Dorsalis pedis pulses are 1+ on the right side and 1+ on the left side.       Posterior tibial pulses are 1+ on the right side and 1+ on the left side.     Heart sounds: Normal heart sounds, S1 normal and S2 normal. No murmur heard.  No gallop.   Pulmonary:     Effort: Pulmonary effort is normal. No respiratory distress.     Breath sounds: No wheezing.  Abdominal:     General: There is no distension.     Tenderness: There is no abdominal tenderness. There is no guarding.  Musculoskeletal:     Right shoulder: No swelling or deformity.     Cervical back: Normal range of motion and neck supple.  Skin:    General: Skin is warm and dry.     Findings: No rash.     Nails: There is no clubbing.  Neurological:     Mental Status: He is alert and oriented to person, place, and time.     Cranial Nerves: No cranial nerve deficit.     Sensory: No sensory deficit.     Gait: Gait normal.     Deep Tendon Reflexes: Reflexes are normal and symmetric.  Psychiatric:        Speech: Speech normal.        Behavior: Behavior normal. Behavior is cooperative.        Thought Content: Thought content normal.        Judgment: Judgment normal.  CMP ( most recent) CMP     Component Value Date/Time   NA 141 10/08/2019 1556   K 4.5 10/08/2019 1556   CL 105 10/08/2019 1556   CO2 27 10/08/2019 1556   GLUCOSE 173 (H) 10/08/2019 1556   GLUCOSE 125 (H) 01/07/2018 0931   BUN 19 10/08/2019 1556   CREATININE 1.17 10/08/2019 1556   CALCIUM 10.1 10/08/2019 1556   PROT 6.6 10/08/2019 1556   ALBUMIN 4.5 10/08/2019 1556   AST 25 10/08/2019 1556   ALT 32 10/08/2019 1556   ALKPHOS 93  10/08/2019 1556   BILITOT 0.6 10/08/2019 1556   GFRNONAA 60 10/08/2019 1556   GFRAA 69 10/08/2019 1556     Diabetic Labs (most recent): Lab Results  Component Value Date   HGBA1C 6.4 01/14/2020   HGBA1C 10.2 09/16/2019   HGBA1C 11.3 (H) 12/26/2016     Lipid Panel ( most recent) Lipid Panel     Component Value Date/Time   CHOL 143 01/22/2018 1257   TRIG 213 (H) 01/22/2018 1257   HDL 32 (L) 01/22/2018 1257   CHOLHDL 4.5 01/22/2018 1257   CHOLHDL 4.4 12/24/2016 0634   VLDL 13 12/24/2016 0634   LDLCALC 68 01/22/2018 1257   LABVLDL 43 (H) 01/22/2018 1257       Assessment & Plan:   1. 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  - Matthew [name] has currently uncontrolled symptomatic type 2 DM since  79 years of age. He presents with significant improvement in his glycemic profile both fasting and postprandial.  He has no hypoglycemia.  His point-of-care A1c 6.4%, improving from 10.9%.    Recent labs reviewed. - I had a long discussion with him about the progressive nature of diabetes and the pathology behind its complications. -his diabetes is complicated by coronary artery disease, peripheral arterial disease, and he remains at a high risk for more acute and chronic complications which include CAD, CVA, CKD, retinopathy, and neuropathy. These are all discussed in detail with him.  - I have counseled him on diet  and weight management  by adopting a carbohydrate restricted/protein rich diet. Patient is encouraged to switch to  unprocessed or minimally processed     complex starch and increased protein intake (animal or plant source), fruits, and vegetables. -  he is advised to stick to a routine mealtimes to eat 3 meals  a day and avoid unnecessary snacks ( to snack only to correct hypoglycemia).   - he  admits there is a room for improvement in his diet and drink choices. -  Suggestion is made for him to avoid simple carbohydrates  from his  diet including Cakes, Sweet Desserts / Pastries, Ice Cream, Soda (diet and regular), Sweet Tea, Candies, Chips, Cookies, Sweet Pastries,  Store Bought Juices, Alcohol in Excess of  1-2 drinks a day, Artificial Sweeteners, Coffee Creamer, and "Sugar-free" Products. This will help patient to have stable blood glucose profile and potentially avoid unintended weight gain.   - he will be scheduled with Jearld Fenton, RDN, CDE for diabetes education.  - I have approached him with the following individualized plan to manage  his diabetes and patient agrees:   -In light of his chronic glycemic burden, he will continue to need multiple daily injections of insulin in order for him to achieve and maintain control of diabetes to target.    -He has responded and tolerated his medications.  He is advised to lower his NovoLog 70/30 to  10 units with breakfast and 10 units with supper   for premeal blood glucose readings above 90 mg/dl ,  associated with strict monitoring of glucose 4 times a day-before meals and at bedtime. -He has received a CGM sensor, however no device.  I ordered a  freestyle libre device for him.    - he is warned not to take insulin without proper monitoring per orders.  - he is encouraged to call clinic for blood glucose levels less than 70 or above 200 mg /dl. -He is advised to continue alogliptin 12.5 mg p.o. daily with breakfast and Metformin 500 mg XR p.o. daily at breakfast.    - Specific targets for  A1c;  LDL, HDL,  and Triglycerides were discussed with the patient.  2) Blood Pressure /Hypertension: His blood pressure is controlled to target.  However, in light of his microalbuminuria, he would benefit from losartan in lieu of amlodipine.  I discussed and prescribed losartan 50 mg p.o. daily along with metoprolol 25 mg p.o. twice daily.  He is advised to discontinue amlodipine.  3) Lipids/Hyperlipidemia:   Review of his recent lipid panel showed  controlled  LDL at 68 .  he  Is  not on statins, advised to bring his medications for reconciliation next time.  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 daily.  He reports myalgia, mainly due to PAD-see below.  He will be considered for low-dose statin next visit.   4)  Weight/Diet:  Body mass index is 29.73 kg/m.  -  clearly complicating his diabetes care.   he is  a candidate for weight loss. I discussed with him the fact that loss of 5 - 10% of his  current body weight will have the most impact on his diabetes management.  Exercise, and detailed carbohydrates information provided  -  detailed on discharge instructions.   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  He is referred to vascular surgery for better evaluation and treatment.  6) Chronic Care/Health Maintenance:  -he  Is not  On ARB and not on  Statin medications and  is encouraged to initiate and continue to follow up with Ophthalmology, Dentist,  Podiatrist at least yearly or according to recommendations, and advised to   stay away from smoking. I have recommended yearly flu vaccine and pneumonia vaccine at least every 5 years; moderate intensity exercise for up to 150 minutes weekly; and  sleep for at least 7 hours a day.    - he is  advised to maintain close follow up with Wenda Low, MD for primary care needs, as well as his other providers for optimal and coordinated care.  - Time spent on this patient care encounter:  35 min, of which > 50% was spent in  counseling and the rest reviewing his blood glucose logs , discussing his hypoglycemia and hyperglycemia episodes, reviewing his current and  previous labs / studies  ( including abstraction from other facilities) and medications  doses and developing a  long term treatment plan and documenting his care.   Please refer to Patient Instructions for Blood Glucose Monitoring  and Insulin/Medications Dosing Guide"  in media tab for additional information. Please  also refer to " Patient Self Inventory" in the Media  tab for reviewed elements of pertinent patient history.  Gilda Crease participated in the discussions, expressed understanding, and voiced  agreement with the above plans.  All questions were answered to his satisfaction. he is encouraged to contact clinic should he have any questions or concerns prior to his return visit.   Follow up plan: - Return in about 6 months (around 07/14/2020) for F/U with Pre-visit Labs, Meter, Logs, A1c here.Glade Lloyd, MD [facility] [address] [place], [place] [address] Phone: [phone]  Fax: [phone]    01/14/2020, 5:38 PM  This note was partially dictated with voice recognition software. Similar sounding words can be transcribed inadequately or may not  be corrected upon review.

## 2020-02-10 ENCOUNTER — Other Ambulatory Visit: Payer: Self-pay

## 2020-02-10 ENCOUNTER — Telehealth: Payer: Self-pay

## 2020-02-10 DIAGNOSIS — E1159 Type 2 diabetes mellitus with other circulatory complications: Secondary | ICD-10-CM

## 2020-02-10 MED ORDER — FREESTYLE LIBRE 2 READER DEVI: 0 refills | Status: DC

## 2020-02-10 MED ORDER — METFORMIN HCL ER 500 MG PO TB24: 500.0000 mg | ORAL_TABLET | Freq: Every day | ORAL | 3 refills | Status: DC

## 2020-02-10 MED ORDER — FREESTYLE LIBRE 2 SENSOR MISC: 1.0000 | 3 refills | Status: DC

## 2020-02-10 NOTE — Telephone Encounter (Signed)
Patient is requesting refill on his metFORMIN (GLUCOPHAGE XR) 500 MG 24 hr tablet to the [place]. Also, he said he is needing the reader for his Josephine Igo. He said that was suppose to have been sent in and he has not received anything

## 2020-02-10 NOTE — Telephone Encounter (Signed)
Pt is calling and states they did not receive the freestyle libre and that is what he was calling about. He states he called the [place] and they do not have it

## 2020-02-10 NOTE — Telephone Encounter (Signed)
Spoke with patient and advised he will have to contact the [facility] pharmacy to check the status, metformin script was sent in, patient verbalized understanding.

## 2020-02-10 NOTE — Telephone Encounter (Signed)
Resent to [facility] pharmacy

## 2020-03-08 ENCOUNTER — Telehealth: Payer: Self-pay

## 2020-03-08 NOTE — Telephone Encounter (Signed)
Helping Rosaria Ferries with [place] insurance

## 2020-03-10 ENCOUNTER — Telehealth: Payer: Self-pay

## 2020-03-10 NOTE — Telephone Encounter (Signed)
I have talked with [facility] and we are working on getting the claim filed with [place].  We can not get [facility] to authorize in Epic due to not having [employer] # however we have a faxed authorization for treatment.  More to come on this.  I am waiting to find out what we need to do to file the claim.

## 2020-03-31 ENCOUNTER — Other Ambulatory Visit: Payer: Self-pay

## 2020-03-31 ENCOUNTER — Telehealth: Payer: Self-pay

## 2020-03-31 DIAGNOSIS — E1159 Type 2 diabetes mellitus with other circulatory complications: Secondary | ICD-10-CM

## 2020-03-31 MED ORDER — FREESTYLE LIBRE 2 READER DEVI: 0 refills | Status: AC

## 2020-03-31 MED ORDER — FREESTYLE LIBRE 2 SENSOR MISC: 1.0000 | 3 refills | Status: DC

## 2020-03-31 NOTE — Telephone Encounter (Signed)
Patient said that he can not get his RX for his Matthew [name] and supplies from the [facility]. It has to come from a pharmacy. He has some coupons for this but would prefer a hand written script so he can see where he can get the best deal. If this is possible, please patient know when ready for pick up

## 2020-03-31 NOTE — Telephone Encounter (Signed)
Returned call to patient, he stated that the [place] won't fill and he has tried [employer] and it is not covered, he is requesting print copy of scripts to get filled at [facility] with a coupon he has obtained. Patient will stop by office to pick up.

## 2020-06-08 DIAGNOSIS — E7800 Pure hypercholesterolemia, unspecified: Secondary | ICD-10-CM | POA: Diagnosis not present

## 2020-06-08 DIAGNOSIS — I252 Old myocardial infarction: Secondary | ICD-10-CM | POA: Diagnosis not present

## 2020-06-08 DIAGNOSIS — E1142 Type 2 diabetes mellitus with diabetic polyneuropathy: Secondary | ICD-10-CM | POA: Diagnosis not present

## 2020-06-08 DIAGNOSIS — M179 Osteoarthritis of knee, unspecified: Secondary | ICD-10-CM | POA: Diagnosis not present

## 2020-06-08 DIAGNOSIS — E1122 Type 2 diabetes mellitus with diabetic chronic kidney disease: Secondary | ICD-10-CM | POA: Diagnosis not present

## 2020-06-08 DIAGNOSIS — K219 Gastro-esophageal reflux disease without esophagitis: Secondary | ICD-10-CM | POA: Diagnosis not present

## 2020-06-08 DIAGNOSIS — I10 Essential (primary) hypertension: Secondary | ICD-10-CM | POA: Diagnosis not present

## 2020-06-08 DIAGNOSIS — Z794 Long term (current) use of insulin: Secondary | ICD-10-CM | POA: Diagnosis not present

## 2020-06-08 DIAGNOSIS — N183 Chronic kidney disease, stage 3 unspecified: Secondary | ICD-10-CM | POA: Diagnosis not present

## 2020-06-10 ENCOUNTER — Telehealth: Payer: Self-pay | Admitting: *Deleted

## 2020-06-10 NOTE — Telephone Encounter (Signed)
   [facility] Pre-operative Risk Assessment    Patient Name: Matthew [name]  DOB: 21-Apr-1940  MRN: [id number]   HEARTCARE STAFF: - Please ensure there is not already an duplicate clearance open for this procedure. - Under Visit Info/Reason for Call, type in Other and utilize the format Clearance MM/DD/YY or Clearance TBD. Do not use dashes or single digits. - If request is for dental extraction, please clarify the # of teeth to be extracted.  Request for surgical clearance:  1. What type of surgery is being performed? REVISION RIGHT KNEE UNI TO TKA     2. When is this surgery scheduled?  08/25/20   3. What type of clearance is required (medical clearance vs. Pharmacy clearance to hold med vs. Both)? MEDICAL  4. Are there any medications that need to be held prior to surgery and how long? PLAVIX    5. Practice name and name of physician performing surgery? [facility]; DR. FRANK ALUISIO   6. What is the office phone number? [phone]   7.   What is the office fax number? [phone] ATTN: [place]  8.   Anesthesia type (None, local, MAC, general) ? CHOICE    Julaine Hua 06/10/2020, 3:09 PM  _________________________________________________________________   (provider comments below)

## 2020-06-11 NOTE — Telephone Encounter (Signed)
Patient is returning call.  °

## 2020-06-11 NOTE — Telephone Encounter (Signed)
   Name: Matthew [name]  DOB: 1940/05/26  MRN: [id number]   Primary Cardiologist: Lauree Chandler, MD  Chart reviewed as part of pre-operative protocol coverage.   Left a voicemail for patient to call back for ongoing preop assessment.  Per previous recommendation by Dr. Angelena Form, and given lack of interval change in cardiac history, patient can hold plavix 5 days prior to his upcoming surgery with plans to restart when cleared to do so by his orthopedist.  Abigail Butts, PA-C 06/11/2020, 10:02 AM

## 2020-06-11 NOTE — Telephone Encounter (Signed)
   Name: Matthew [name]  DOB: [birth year]  MRN: [id number]   Primary Cardiologist: Lauree Chandler, MD  Chart reviewed as part of pre-operative protocol coverage. Patient was contacted 06/11/2020 in reference to pre-operative risk assessment for pending surgery as outlined below.  Matthew [name] was last seen on 11/18/19 by  Ermalinda Barrios, PA-C.  Since that day, Matthew [name] has done fine from a cardiac standpoint. He is able to complete 4 METs without anginal complaints.  Therefore, based on ACC/AHA guidelines, the patient would be at acceptable risk for the planned procedure without further cardiovascular testing.   The patient was advised that if he develops new symptoms prior to surgery to contact our office to arrange for a follow-up visit, and he verbalized understanding.  Per previous recommendation by Dr. Angelena Form, and given lack of interval change in cardiac history, patient can hold plavix 5 days prior to his upcoming surgery with plans to restart when cleared to do so by his orthopedist.  I will route this recommendation to the requesting party via Epic fax function and remove from pre-op pool. Please call with questions.  Abigail Butts, PA-C 06/11/2020, 4:13 PM

## 2020-06-22 DIAGNOSIS — Z794 Long term (current) use of insulin: Secondary | ICD-10-CM | POA: Insufficient documentation

## 2020-06-22 DIAGNOSIS — E1142 Type 2 diabetes mellitus with diabetic polyneuropathy: Secondary | ICD-10-CM | POA: Insufficient documentation

## 2020-06-22 DIAGNOSIS — H5712 Ocular pain, left eye: Secondary | ICD-10-CM | POA: Diagnosis not present

## 2020-06-22 DIAGNOSIS — N200 Calculus of kidney: Secondary | ICD-10-CM | POA: Insufficient documentation

## 2020-06-22 DIAGNOSIS — I2510 Atherosclerotic heart disease of native coronary artery without angina pectoris: Secondary | ICD-10-CM | POA: Insufficient documentation

## 2020-06-22 DIAGNOSIS — N400 Enlarged prostate without lower urinary tract symptoms: Secondary | ICD-10-CM | POA: Insufficient documentation

## 2020-06-22 DIAGNOSIS — N183 Chronic kidney disease, stage 3 unspecified: Secondary | ICD-10-CM | POA: Insufficient documentation

## 2020-06-22 DIAGNOSIS — K219 Gastro-esophageal reflux disease without esophagitis: Secondary | ICD-10-CM | POA: Insufficient documentation

## 2020-06-23 ENCOUNTER — Telehealth: Payer: Self-pay

## 2020-06-23 DIAGNOSIS — E1159 Type 2 diabetes mellitus with other circulatory complications: Secondary | ICD-10-CM

## 2020-06-23 MED ORDER — FREESTYLE LIBRE 2 SENSOR MISC: 1.0000 | 3 refills | Status: DC

## 2020-06-23 NOTE — Telephone Encounter (Signed)
Rx sent.  Pt notified. 

## 2020-06-23 NOTE — Telephone Encounter (Signed)
Pt states he needs a new RX sent in for his [employer] 14 day. He needs these sent to the [facility]. Please call pt as well.

## 2020-06-28 DIAGNOSIS — M545 Low back pain, unspecified: Secondary | ICD-10-CM | POA: Diagnosis not present

## 2020-07-14 ENCOUNTER — Encounter: Payer: Self-pay | Admitting: "Endocrinology

## 2020-07-14 ENCOUNTER — Other Ambulatory Visit: Payer: Self-pay

## 2020-07-14 ENCOUNTER — Ambulatory Visit: Payer: Medicare Other | Admitting: "Endocrinology

## 2020-07-14 VITALS — BP 144/66 | HR 56 | Ht 68.0 in | Wt 195.0 lb

## 2020-07-14 DIAGNOSIS — I10 Essential (primary) hypertension: Secondary | ICD-10-CM

## 2020-07-14 DIAGNOSIS — E1159 Type 2 diabetes mellitus with other circulatory complications: Secondary | ICD-10-CM | POA: Diagnosis not present

## 2020-07-14 DIAGNOSIS — E782 Mixed hyperlipidemia: Secondary | ICD-10-CM

## 2020-07-14 NOTE — Progress Notes (Signed)
07/14/2020, 1:13 PM  Endocrinology follow-up note   Subjective:    Patient ID: Matthew [name], male    DOB: 08-03-1940.  Matthew [name] is being seen in follow up after he was seen in consultation for management of currently uncontrolled symptomatic diabetes requested by  Wenda Low, MD.   Past Medical History:  Diagnosis Date  . Anticoagulated    plavix  . Arthritis    Shoulder, knees, back   . CAD in native artery cardiologist-  dr Angelena Form   a. CAD/NSTEMI ,  cardiac cath staged stenting-- 12-25-2016  PTCA and DES x3 to prox. and mid LAD;  12-26-2016  PCI to PLA and DES x1 to midRCA,  EF 60-65%.  . Chronic lower back pain   . CKD (chronic kidney disease), stage III ([place])   . Complication of anesthesia    "Too much with shoulder surgery", pt. reports that he was told the at they "lost him, due to absorbing too much anesthesia".  shoulder surgery 1985  . DDD (degenerative disc disease), lumbosacral   . GERD (gastroesophageal reflux disease)   . History of gastric ulcer 01/2017  . History of kidney stones   . History of malignant melanoma    right side of nose  . History of non-ST elevation myocardial infarction (NSTEMI) 12/23/2017   s/p  staged cardiac cath,  s/p  PCI and DEStenting  . Hyperlipidemia   . Hypertension   . IDA (iron deficiency anemia)   . Nocturia   . RBBB   . Renal calculus, right   . S/P drug eluting coronary stent placement 12-25-2017,  12-26-2017   PTCA and DES x3 to prox. and mid LAD;  PCI to PLA and DES x1 to [facility]  . Type 2 diabetes mellitus treated with insulin ([place])    followed by pcp    Past Surgical History:  Procedure Laterality Date  . ANAL FISSURE REPAIR  X 2  . CATARACT EXTRACTION W/ INTRAOCULAR LENS  IMPLANT, BILATERAL Bilateral 2017;  2015  . COLONOSCOPY WITH PROPOFOL N/A 06/05/2016   Procedure: COLONOSCOPY WITH PROPOFOL;  Surgeon: Garlan Fair, MD;  Location: [facility];  Service: Endoscopy;   Laterality: N/A;  . CORONARY ANGIOGRAPHY N/A 12/26/2016   Procedure: CORONARY ANGIOGRAPHY;  Surgeon: Troy Sine, MD;  Location: [place] [facility];  Service: Cardiovascular;  Laterality: N/A;  . CORONARY STENT INTERVENTION N/A 12/25/2016   Procedure: CORONARY STENT INTERVENTION;  Surgeon: Burnell Blanks, MD;  Location: [place] [facility];  Service: Cardiovascular;  Laterality: N/A;  . CORONARY STENT INTERVENTION N/A 12/26/2016   Procedure: CORONARY STENT INTERVENTION;  Surgeon: Troy Sine, MD;  Location: [place] [facility];  Service: Cardiovascular;  Laterality: N/A;  . CYSTOSCOPY/URETEROSCOPY/HOLMIUM LASER/STENT PLACEMENT Right 01/07/2018   Procedure: RIGHT URETEROSCOPY/HOLMIUM LASER/STENT PLACEMENT;  Surgeon: Lucas Mallow, MD;  Location: [facility];  Service: Urology;  Laterality: Right;  . ESOPHAGOGASTRODUODENOSCOPY (EGD) WITH PROPOFOL N/A 01/12/2017   Procedure: ESOPHAGOGASTRODUODENOSCOPY (EGD) WITH PROPOFOL;  Surgeon: Wilford Corner, MD;  Location: [place];  Service: Endoscopy;  Laterality: N/A;  . HAND TENDON SURGERY Right 10-29-2002   dr Burney Gauze @[facility]    right index and thumb  . KNEE ARTHROSCOPY Bilateral 2009-;2010   @[facility]   . LEFT HEART CATH AND CORONARY ANGIOGRAPHY N/A 12/25/2016   Procedure: LEFT HEART CATH AND CORONARY ANGIOGRAPHY;  Surgeon: Burnell Blanks, MD;  Location: [place] [facility];  Service: Cardiovascular;  Laterality: N/A;  . LEFT HEART CATH AND CORONARY ANGIOGRAPHY N/A 10/10/2019   Procedure: LEFT HEART CATH AND CORONARY ANGIOGRAPHY;  Surgeon: Troy Sine, MD;  Location: [place] [facility];  Service: Cardiovascular;  Laterality: N/A;  . LUMBAR LAMINECTOMY/DECOMPRESSION MICRODISCECTOMY N/A 06/09/2015   Procedure: Laminectomy - T12-L1;  Surgeon: Eustace Moore, MD;  Location: [place] [facility];  Service: Neurosurgery;  Laterality: N/A;  Laminectomy - T12-L1  . MEDIAL PARTIAL KNEE REPLACEMENT Bilateral 2009-2010    [facility]    . MELANOMA EXCISION Right    "side of my nose"  . SHOULDER SURGERY Left 1985  . TOTAL SHOULDER ARTHROPLASTY Left 12/06/2012   Procedure: LEFT TOTAL SHOULDER ARTHROPLASTY VERSES A REVERSE TOTAL SHOULDER ARTHROPLASTY;  Surgeon: Augustin Schooling, MD;  Location: [employer];  Service: Orthopedics;  Laterality: Left;    Social History   Socioeconomic History  . Marital status: Married    Spouse name: Not on file  . Number of children: Not on file  . Years of education: Not on file  . Highest education level: Not on file  Occupational History  . Not on file  Tobacco Use  . Smoking status: Never Smoker  . Smokeless tobacco: Never Used  Vaping Use  . Vaping Use: Never used  Substance and Sexual Activity  . Alcohol use: No  . Drug use: No  . Sexual activity: Yes  Other Topics Concern  . Not on file  Social History Narrative  . Not on file   Social Determinants of Health   Financial Resource Strain: Not on file  Food Insecurity: Not on file  Transportation Needs: Not on file  Physical Activity: Not on file  Stress: Not on file  Social Connections: Not on file    Family History  Problem Relation Age of Onset  . CAD Father   . Heart failure Father   . CAD Sister   . Stroke Mother   . Heart failure Mother     Outpatient Encounter Medications as of 07/14/2020  Medication Sig  . acetaminophen (TYLENOL) 325 MG tablet Take 2 tablets (650 mg total) by mouth every 6 (six) hours as needed for mild pain or headache.  . Alogliptin Benzoate 12.5 MG TABS Take 12.5 mg by mouth daily.  Marland Kitchen amoxicillin (AMOXIL) 500 MG capsule Take 4 capsules by mouth as needed. Take 4 capsules 1 hour prior to dental work  . Ascorbic Acid (VITAMIN C PO) Take 1,000 mg by mouth daily.  . clopidogrel (PLAVIX) 75 MG tablet Take 1 tablet (75 mg total) by mouth daily.  . Continuous Blood Gluc Receiver (FREESTYLE LIBRE 2 READER) DEVI As directed  . Continuous Blood Gluc Sensor (FREESTYLE LIBRE 2 SENSOR) MISC 1 Piece  by Does not apply route every 14 (fourteen) days.  . insulin aspart protamine - aspart (NOVOLOG 70/30 FLEXPEN RELION) (70-30) 100 UNIT/ML FlexPen Inject 10 Units into the skin 2 (two) times daily before a meal.  . isosorbide mononitrate (IMDUR) 60 MG 24 hr tablet Take 1.5 tablets (90 mg total) by mouth daily.  Marland Kitchen losartan (COZAAR) 50 MG tablet Take 1 tablet (50 mg total) by mouth daily.  . metFORMIN (GLUCOPHAGE XR) 500 MG 24 hr tablet Take 1 tablet (500 mg total) by mouth daily with breakfast.  . metoprolol tartrate (LOPRESSOR) 25 MG tablet TAKE 1 TABLET BY MOUTH TWICE A DAY  . nitroGLYCERIN (NITROSTAT) 0.4 MG SL tablet Place 1 tablet (0.4 mg total) under the tongue every 5 (five) minutes as needed  for chest pain.  . Omega-3 Fatty Acids (FISH OIL) 1000 MG CAPS Take 1,000 mg by mouth daily.  . pantoprazole (PROTONIX) 40 MG tablet Take 40 mg by mouth daily.  . TURMERIC PO Take 1,500 mg by mouth 2 (two) times daily.  . Vitamin D, Cholecalciferol, 25 MCG (1000 UT) CAPS Take 1,000 Units by mouth daily.   . Zinc 50 MG CAPS Take 1 capsule by mouth daily.   No facility-administered encounter medications on file as of 07/14/2020.    ALLERGIES: Allergies  Allergen Reactions  . Statins Other (See Comments)    Severe stomach pain.  . Oxycodone Itching  . Hydrocodone Other (See Comments)    "messes with my hearing"  . Codeine Itching  . Morphine And Related Itching    VACCINATION STATUS: Immunization History  Administered Date(s) Administered  . Influenza-Unspecified 10/24/2012  . Moderna Sars-Covid-2 Vaccination 02/07/2019, 03/10/2019    Diabetes He presents for his follow-up diabetic visit. He has type 2 diabetes mellitus. Onset time: He was diagnosed at approximate age of 77 years. His disease course has been stable. There are no hypoglycemic associated symptoms. Pertinent negatives for hypoglycemia include no confusion, headaches, pallor or seizures. Pertinent negatives for diabetes include  no blurred vision, no chest pain, no fatigue, no polydipsia, no polyphagia, no polyuria and no weakness. There are no hypoglycemic complications. Symptoms are stable. Diabetic complications include heart disease and PVD. Risk factors for coronary artery disease include diabetes mellitus, dyslipidemia, male sex, obesity and sedentary lifestyle. Current diabetic treatment includes insulin injections (He is currently on NovoLog 70/30 26 units daily, alogliptin 12.5 mg p.o. daily.). His weight is stable. He is following a generally unhealthy diet. When asked about meal planning, he reported none. He has had a previous visit with a dietitian. He participates in exercise intermittently. His home blood glucose trend is fluctuating minimally. His breakfast blood glucose range is generally 130-140 mg/dl. His bedtime blood glucose range is generally 130-140 mg/dl. His overall blood glucose range is 130-140 mg/dl. (He presents with continued improvement in his glycemic profile both fasting and postprandial.  His previsit labs show A1c of 6.8%, overall improvement from 10.9%.   ) An ACE inhibitor/angiotensin II receptor blocker is not being taken. Eye exam is current.  Hyperlipidemia This is a chronic problem. The current episode started more than 1 year ago. The problem is uncontrolled. Exacerbating diseases include diabetes. Associated symptoms include myalgias. Pertinent negatives include no chest pain or shortness of breath. Current antihyperlipidemic treatment includes bile acid sequestrants. Risk factors for coronary artery disease include diabetes mellitus, dyslipidemia, hypertension and male sex.     Review of Systems  Constitutional: Negative for chills, fatigue, fever and unexpected weight change.  HENT: Negative for dental problem, mouth sores and trouble swallowing.   Eyes: Negative for blurred vision and visual disturbance.  Respiratory: Negative for cough, choking, chest tightness, shortness of breath  and wheezing.   Cardiovascular: Negative for chest pain, palpitations and leg swelling.  Gastrointestinal: Negative for abdominal distention, abdominal pain, constipation, diarrhea, nausea and vomiting.  Endocrine: Negative for polydipsia, polyphagia and polyuria.  Genitourinary: Negative for dysuria, flank pain, hematuria and urgency.  Musculoskeletal: Positive for myalgias. Negative for back pain, gait problem and neck pain.  Skin: Negative for pallor, rash and wound.  Neurological: Negative for seizures, syncope, weakness, numbness and headaches.  Psychiatric/Behavioral: Negative for confusion and dysphoric mood.    Objective:    Vitals with BMI 07/14/2020 01/14/2020 12/10/2019  Height 5\' 8"  5'  8" 5\' 8"   Weight 195 lbs 195 lbs 8 oz 196 lbs  BMI 29.66 71.06 26.94  Systolic 854 627 035  Diastolic 66 88 78  Pulse 56 64 60    BP (!) 144/66   Pulse (!) 56   Ht 5\' 8"  (1.727 m)   Wt 195 lb (88.5 kg)   BMI 29.65 kg/m   Wt Readings from Last 3 Encounters:  07/14/20 195 lb (88.5 kg)  01/14/20 195 lb 8 oz (88.7 kg)  12/10/19 196 lb (88.9 kg)     Physical Exam Constitutional:      General: He is not in acute distress.    Appearance: He is well-developed.  HENT:     Head: Normocephalic and atraumatic.  Neck:     Thyroid: No thyromegaly.     Trachea: No tracheal deviation.  Cardiovascular:     Rate and Rhythm: Normal rate.     Pulses:          Dorsalis pedis pulses are 1+ on the right side and 1+ on the left side.       Posterior tibial pulses are 1+ on the right side and 1+ on the left side.     Heart sounds: Normal heart sounds, S1 normal and S2 normal. No murmur heard. No gallop.   Pulmonary:     Effort: Pulmonary effort is normal. No respiratory distress.     Breath sounds: No wheezing.  Abdominal:     General: There is no distension.     Tenderness: There is no abdominal tenderness. There is no guarding.  Musculoskeletal:     Right shoulder: No swelling or deformity.      Cervical back: Normal range of motion and neck supple.  Skin:    General: Skin is warm and dry.     Findings: No rash.     Nails: There is no clubbing.  Neurological:     Mental Status: He is alert and oriented to person, place, and time.     Cranial Nerves: No cranial nerve deficit.     Sensory: No sensory deficit.     Gait: Gait normal.     Deep Tendon Reflexes: Reflexes are normal and symmetric.  Psychiatric:        Speech: Speech normal.        Behavior: Behavior normal. Behavior is cooperative.        Thought Content: Thought content normal.        Judgment: Judgment normal.     CMP ( most recent) CMP     Component Value Date/Time   NA 141 10/08/2019 1556   K 4.5 10/08/2019 1556   CL 105 10/08/2019 1556   CO2 27 10/08/2019 1556   GLUCOSE 173 (H) 10/08/2019 1556   GLUCOSE 125 (H) 01/07/2018 0931   BUN 19 10/08/2019 1556   CREATININE 1.17 10/08/2019 1556   CALCIUM 10.1 10/08/2019 1556   PROT 6.6 10/08/2019 1556   ALBUMIN 4.5 10/08/2019 1556   AST 25 10/08/2019 1556   ALT 32 10/08/2019 1556   ALKPHOS 93 10/08/2019 1556   BILITOT 0.6 10/08/2019 1556   GFRNONAA 60 10/08/2019 1556   GFRAA 69 10/08/2019 1556     Diabetic Labs (most recent): Lab Results  Component Value Date   HGBA1C 6.4 01/14/2020   HGBA1C 10.2 09/16/2019   HGBA1C 11.3 (H) 12/26/2016     Lipid Panel ( most recent) Lipid Panel     Component Value Date/Time   CHOL 143 01/22/2018  1257   TRIG 213 (H) 01/22/2018 1257   HDL 32 (L) 01/22/2018 1257   CHOLHDL 4.5 01/22/2018 1257   CHOLHDL 4.4 12/24/2016 0634   VLDL 13 12/24/2016 0634   LDLCALC 68 01/22/2018 1257   LABVLDL 43 (H) 01/22/2018 1257       Assessment & Plan:   1. 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  - Matthew [name] has currently uncontrolled symptomatic type 2 DM since  80 years of age. He presents with continued improvement in his glycemic profile both fasting and postprandial.  His  previsit labs show A1c of 6.8%, overall improvement from 10.9%.    Recent labs reviewed. - I had a long discussion with him about the progressive nature of diabetes and the pathology behind its complications. -his diabetes is complicated by coronary artery disease, peripheral arterial disease, and he remains at a high risk for more acute and chronic complications which include CAD, CVA, CKD, retinopathy, and neuropathy. These are all discussed in detail with him.  - I have counseled him on diet  and weight management  by adopting a carbohydrate restricted/protein rich diet. Patient is encouraged to switch to  unprocessed or minimally processed     complex starch and increased protein intake (animal or plant source), fruits, and vegetables. -  he is advised to stick to a routine mealtimes to eat 3 meals  a day and avoid unnecessary snacks ( to snack only to correct hypoglycemia).   - he acknowledges that there is a room for improvement in his food and drink choices. - Suggestion is made for him to avoid simple carbohydrates  from his diet including Cakes, Sweet Desserts, Ice Cream, Soda (diet and regular), Sweet Tea, Candies, Chips, Cookies, Store Bought Juices, Alcohol in Excess of  1-2 drinks a day, Artificial Sweeteners,  Coffee Creamer, and "Sugar-free" Products, Lemonade. This will help patient to have more stable blood glucose profile and potentially avoid unintended weight gain.   - he will be scheduled with Jearld Fenton, RDN, CDE for diabetes education.  - I have approached him with the following individualized plan to manage  his diabetes and patient agrees:   -In light of his chronic glycemic burden, he will continue to need multiple daily injections of insulin in order for him to achieve and maintain control of diabetes to target.    -He has responded and tolerated low-dose premixed insulin.  He is advised to continue NovoLog 70/30  10 units with breakfast and 10 units with supper   for  premeal blood glucose readings above 90 mg/dl ,  associated with strict monitoring of glucose 4 times a day-before meals and at bedtime. -He has received a CGM sensor, however no device.  I ordered a  freestyle libre device for him.    - he is warned not to take insulin without proper monitoring per orders.  - he is encouraged to call clinic for blood glucose levels less than 70 or above 200 mg /dl. -He is advised to continue alogliptin 12.5 mg p.o. daily with breakfast and Metformin 500 mg XR p.o. daily at breakfast.    - Specific targets for  A1c;  LDL, HDL,  and Triglycerides were discussed with the patient.  2) Blood Pressure /Hypertension: His blood pressure is controlled to near target.  However, in light of his microalbuminuria, he would benefit from losartan in lieu of amlodipine.  I discussed and prescribed losartan 50 mg p.o. daily along with metoprolol  25 mg p.o. twice daily.  He is advised to discontinue amlodipine.  3) Lipids/Hyperlipidemia:   Review of his recent lipid panel showed  controlled  LDL at 68 .  he  Is not on statins, advised to bring his medications for reconciliation next time.  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 daily.  He reports myalgia, mainly due to PAD-see below.  He will be considered for low-dose statin next visit.   4)  Weight/Diet:  Body mass index is 29.65 kg/m.  -  clearly complicating his diabetes care.   he is  a candidate for weight loss. I discussed with him the fact that loss of 5 - 10% of his  current body weight will have the most impact on his diabetes management.  Exercise, and detailed carbohydrates information provided  -  detailed on discharge instructions.   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  He is referred to vascular surgery for better evaluation and treatment.  6) Chronic Care/Health  Maintenance:  -he  Is not  On ARB and not on  Statin medications and  is encouraged to initiate and continue to follow up with Ophthalmology, Dentist,  Podiatrist at least yearly or according to recommendations, and advised to   stay away from smoking. I have recommended yearly flu vaccine and pneumonia vaccine at least every 5 years; moderate intensity exercise for up to 150 minutes weekly; and  sleep for at least 7 hours a day.    - he is  advised to maintain close follow up with Wenda Low, MD for primary care needs, as well as his other providers for optimal and coordinated care.    I spent 41 minutes in the care of the patient today including review of labs from [facility] [facility], Lipids, Thyroid Function, Hematology (current and previous including abstractions from other facilities); face-to-face time discussing  his blood glucose readings/logs, discussing hypoglycemia and hyperglycemia episodes and symptoms, medications doses, his options of short and long term treatment based on the latest standards of care / guidelines;  discussion about incorporating lifestyle medicine;  and documenting the encounter.    Please refer to Patient Instructions for Blood Glucose Monitoring and Insulin/Medications Dosing Guide"  in media tab for additional information. Please  also refer to " Patient Self Inventory" in the Media  tab for reviewed elements of pertinent patient history.  Gilda Crease participated in the discussions, expressed understanding, and voiced agreement with the above plans.  All questions were answered to his satisfaction. he is encouraged to contact clinic should he have any questions or concerns prior to his return visit.   Follow up plan: - Return in about 3 months (around 10/14/2020) for F/U with Pre-visit Labs, Meter, Logs, A1c here.Glade Lloyd, MD [facility] [address] [place], [place] [address] Phone: [phone]  Fax:  [phone]    07/14/2020, 1:13 PM  This note was partially dictated with voice recognition software. Similar sounding words can be transcribed inadequately or may not  be corrected upon review.

## 2020-07-14 NOTE — Patient Instructions (Signed)

## 2020-07-27 DIAGNOSIS — M545 Low back pain, unspecified: Secondary | ICD-10-CM | POA: Diagnosis not present

## 2020-08-12 NOTE — Progress Notes (Signed)
DUE TO COVID-19 ONLY ONE VISITOR IS ALLOWED TO COME WITH YOU AND STAY IN THE WAITING ROOM ONLY DURING PRE OP AND PROCEDURE DAY OF SURGERY. THE 1 VISITOR  MAY VISIT WITH YOU AFTER SURGERY IN YOUR PRIVATE ROOM DURING VISITING HOURS ONLY!  YOU NEED TO HAVE A COVID 19 TEST ON____7/18/2022 ___ @_______ , THIS TEST MUST BE DONE BEFORE SURGERY,  COVID TESTING SITE [address] [place] [address], IT IS ON THE RIGHT GOING OUT [address] APPROXIMATELY  2 MINUTES PAST [employer] ON THE RIGHT. ONCE YOUR COVID TEST IS COMPLETED,  PLEASE BEGIN THE QUARANTINE INSTRUCTIONS AS OUTLINED IN YOUR HANDOUT.                Matthew [name]  08/12/2020   Your procedure is scheduled on:                08/25/2020   Report to [facility] Main  Entrance   Report to admitting at    0800AM     Call this number if you have problems the morning of surgery [phone]    REMEMBER: NO  SOLID FOOD CANDY OR GUM AFTER MIDNIGHT. CLEAR LIQUIDS UNTIL  0730am        . NOTHING BY MOUTH EXCEPT CLEAR LIQUIDS UNTIL     0730 am   . PLEASE FINISH ENSURE DRINK PER SURGEON ORDER  WHICH NEEDS TO BE COMPLETED AT   0730am    .      CLEAR LIQUID DIET   Foods Allowed                                                                    Coffee and tea, regular and decaf                            Fruit ices (not with fruit pulp)                                      Iced Popsicles                                    Carbonated beverages, regular and diet                                    Cranberry, grape and apple juices Sports drinks like Gatorade Lightly seasoned clear broth or consume(fat free) Sugar, honey syrup ___________________________________________________________________      BRUSH YOUR TEETH MORNING OF SURGERY AND RINSE YOUR MOUTH OUT, NO CHEWING GUM CANDY OR MINTS.     Take these medicines the morning of surgery with A SIP OF WATER:   protonix, metoprolol, imdur   DO NOT TAKE ANY  DIABETIC MEDICATIONS DAY OF YOUR SURGERY                               You may not have any metal on your  body including hair pins and              piercings  Do not wear jewelry, make-up, lotions, powders or perfumes, deodorant             Do not wear nail polish on your fingernails.  Do not shave  48 hours prior to surgery.              Men may shave face and neck.   Do not bring valuables to the hospital. [place].  Contacts, dentures or bridgework may not be worn into surgery.  Leave suitcase in the car. After surgery it may be brought to your room.     Patients discharged the day of surgery will not be allowed to drive home. IF YOU ARE HAVING SURGERY AND GOING HOME THE SAME DAY, YOU MUST HAVE AN ADULT TO DRIVE YOU HOME AND BE WITH YOU FOR 24 HOURS. YOU MAY GO HOME BY TAXI OR UBER OR ORTHERWISE, BUT AN ADULT MUST ACCOMPANY YOU HOME AND STAY WITH YOU FOR 24 HOURS.  Name and phone number of your driver:  Special Instructions: N/A              Please read over the following fact sheets you were given: _____________________________________________________________________  [facility] - Preparing for Surgery Before surgery, you can play an important role.  Because skin is not sterile, your skin needs to be as free of germs as possible.  You can reduce the number of germs on your skin by washing with CHG (chlorahexidine gluconate) soap before surgery.  CHG is an antiseptic cleaner which kills germs and bonds with the skin to continue killing germs even after washing. Please DO NOT use if you have an allergy to CHG or antibacterial soaps.  If your skin becomes reddened/irritated stop using the CHG and inform your nurse when you arrive at Short Stay. Do not shave (including legs and underarms) for at least 48 hours prior to the first CHG shower.  You may shave your face/neck. Please follow these instructions carefully:  1.  Shower with CHG Soap  the night before surgery and the  morning of Surgery.  2.  If you choose to wash your hair, wash your hair first as usual with your  normal  shampoo.  3.  After you shampoo, rinse your hair and body thoroughly to remove the  shampoo.                           4.  Use CHG as you would any other liquid soap.  You can apply chg directly  to the skin and wash                       Gently with a scrungie or clean washcloth.  5.  Apply the CHG Soap to your body ONLY FROM THE NECK DOWN.   Do not use on face/ open                           Wound or open sores. Avoid contact with eyes, ears mouth and genitals (private parts).                       Wash face,  Development worker, international aid (private  parts) with your normal soap.             6.  Wash thoroughly, paying special attention to the area where your surgery  will be performed.  7.  Thoroughly rinse your body with warm water from the neck down.  8.  DO NOT shower/wash with your normal soap after using and rinsing off  the CHG Soap.                9.  Pat yourself dry with a clean towel.            10.  Wear clean pajamas.            11.  Place clean sheets on your bed the night of your first shower and do not  sleep with pets. Day of Surgery : Do not apply any lotions/deodorants the morning of surgery.  Please wear clean clothes to the hospital/surgery center.  FAILURE TO FOLLOW THESE INSTRUCTIONS MAY RESULT IN THE CANCELLATION OF YOUR SURGERY PATIENT SIGNATURE_________________________________  NURSE SIGNATURE__________________________________  ________________________________________________________________________

## 2020-08-17 ENCOUNTER — Encounter (HOSPITAL_COMMUNITY): Payer: Self-pay

## 2020-08-17 ENCOUNTER — Encounter (HOSPITAL_COMMUNITY)
Admission: RE | Admit: 2020-08-17 | Discharge: 2020-08-17 | Disposition: A | Discharge status: Home / Self Care | Payer: Medicare Other | Source: Ambulatory Visit | Attending: Orthopedic Surgery | Admitting: Orthopedic Surgery

## 2020-08-17 ENCOUNTER — Other Ambulatory Visit: Payer: Self-pay

## 2020-08-17 DIAGNOSIS — Z01812 Encounter for preprocedural laboratory examination: Secondary | ICD-10-CM | POA: Diagnosis not present

## 2020-08-17 LAB — COMPREHENSIVE METABOLIC PANEL
ALT: 16 U/L (ref 0–44)
AST: 20 U/L (ref 15–41)
Albumin: 4 g/dL (ref 3.5–5.0)
Alkaline Phosphatase: 69 U/L (ref 38–126)
Anion gap: 9 (ref 5–15)
BUN: 19 mg/dL (ref 8–23)
CO2: 26 mmol/L (ref 22–32)
Calcium: 9.3 mg/dL (ref 8.9–10.3)
Chloride: 104 mmol/L (ref 98–111)
Creatinine, Ser: 1.37 mg/dL — ABNORMAL HIGH (ref 0.61–1.24)
GFR, Estimated: 53 mL/min — ABNORMAL LOW (ref >60)
Glucose, Bld: 156 mg/dL — ABNORMAL HIGH (ref 70–99)
Potassium: 4.4 mmol/L (ref 3.5–5.1)
Sodium: 139 mmol/L (ref 135–145)
Total Bilirubin: 0.9 mg/dL (ref 0.3–1.2)
Total Protein: 7.3 g/dL (ref 6.5–8.1)

## 2020-08-17 LAB — PROTIME-INR
INR: 0.9 (ref 0.8–1.2)
Prothrombin Time: 12.6 seconds (ref 11.4–15.2)

## 2020-08-17 LAB — SURGICAL PCR SCREEN
MRSA, PCR: NEGATIVE
Staphylococcus aureus: NEGATIVE

## 2020-08-17 LAB — CBC
HCT: 41.1 % (ref 39.0–52.0)
Hemoglobin: 13.4 g/dL (ref 13.0–17.0)
MCH: 30.5 pg (ref 26.0–34.0)
MCHC: 32.6 g/dL (ref 30.0–36.0)
MCV: 93.6 fL (ref 80.0–100.0)
Platelets: 191 10*3/uL (ref 150–400)
RBC: 4.39 MIL/uL (ref 4.22–5.81)
RDW: 13.1 % (ref 11.5–15.5)
WBC: 4.6 10*3/uL (ref 4.0–10.5)
nRBC: 0 % (ref 0.0–0.2)

## 2020-08-17 LAB — GLUCOSE, CAPILLARY: Glucose-Capillary: 180 mg/dL — ABNORMAL HIGH (ref 70–99)

## 2020-08-17 LAB — HEMOGLOBIN A1C
Hgb A1c MFr Bld: 7 % — ABNORMAL HIGH (ref 4.8–5.6)
Mean Plasma Glucose: 154.2 mg/dL

## 2020-08-17 NOTE — Progress Notes (Addendum)
Anesthesia Review:  PCP: DR Lysle Rubens  cleasrance- 06/14/20 on chart  LOV 06/08/20  Cardiologist : DR Angelena Form  LOV 11/18/19- michelle Lenze,PAC  Clearance on chart 06/11/20 by Hessie Dibble  Endocrinologist- DR  Concha Se Nida  Segundo 01/14/2020  Chest x-ray : EKG : 10/08/19  Echo : 2018  Stress test: 2020  Cardiac Cath : 10/10/2019  Activity level: can do a flgiht of staris withoiut difficulty  Sleep Study/ CPAP : Fasting Blood Sugar :      / Checks Blood Sugar -- times a day:   Blood Thinner/ Instructions /Last Dose: ASA / Instructions/ Last Dose :   Plavix- to stop 6 days prior to surgery per pt  Checks glucose three times daily with freestyle libre  Hgba1c-08/17/20-7.0

## 2020-08-18 ENCOUNTER — Other Ambulatory Visit (HOSPITAL_COMMUNITY): Payer: Self-pay

## 2020-08-18 NOTE — H&P (Signed)
TOTAL KNEE ADMISSION H&P  Patient is being admitted for right total knee arthroplasty.  Subjective:  Chief Complaint: Right knee pain.  HPI: QUAID YEAKLE, 80 y.o. male presents for pre-operative visit in preparation for their revision right knee unicompartmental arthroplasty to right total knee, which is scheduled on 08/25/2020 with Dr. Wynelle Link at [facility]. The patient has had symptoms in the right knee including instability and pain which has impacted their quality of life and ability to do activities of daily living. The patient currently has a diagnosis of failed right unicompartmental knee arthroplasty and has failed conservative treatments including activity modification and physical therapy. The patient denies an active infection.  Patient Active Problem List   Diagnosis Date Noted   Peripheral arterial disease ([place]) 11/19/2019   RBBB 01/16/2017   PUD (peptic ulcer disease) 01/16/2017   Melena 01/12/2017   Unstable angina ([place]) 01/11/2017   CAD S/P percutaneous coronary angioplasty 01/09/2017   Mixed hyperlipidemia 01/09/2017   DM type 2 causing vascular disease ([place]) 12/24/2016   Essential hypertension, benign 12/24/2016   Chest pain, rule out acute myocardial infarction 12/24/2016   ACS (acute coronary syndrome) [facility])    History of NSTEMI    Myelopathy ([place]) 06/09/2015    Past Medical History:  Diagnosis Date   Anticoagulated    plavix   Arthritis    Shoulder, knees, back    CAD in native artery cardiologist-  dr Angelena Form   a. CAD/NSTEMI ,  cardiac cath staged stenting-- 12-25-2016  PTCA and DES x3 to prox. and mid LAD;  12-26-2016  PCI to PLA and DES x1 to midRCA,  EF 60-65%.   Chronic lower back pain    CKD (chronic kidney disease), stage III (HCC)    Complication of anesthesia    "Too much with shoulder surgery", pt. reports that he was told the at they "lost him, due to absorbing too much anesthesia".  shoulder surgery 1985   DDD (degenerative disc  disease), lumbosacral    GERD (gastroesophageal reflux disease)    History of gastric ulcer 01/2017   History of kidney stones    History of malignant melanoma    right side of nose   History of non-ST elevation myocardial infarction (NSTEMI) 12/23/2017   s/p  staged cardiac cath,  s/p  PCI and DEStenting   Hyperlipidemia    Hypertension    IDA (iron deficiency anemia)    Myocardial infarction ([place])    Nocturia    RBBB    Renal calculus, right    S/P drug eluting coronary stent placement 12-25-2017,  12-26-2017   PTCA and DES x3 to prox. and mid LAD;  PCI to PLA and DES x1 to midRCA   Type 2 diabetes mellitus treated with insulin ([place])    followed by pcp    Past Surgical History:  Procedure Laterality Date   ANAL FISSURE REPAIR  X 2   CATARACT EXTRACTION W/ INTRAOCULAR LENS  IMPLANT, BILATERAL Bilateral 2017;  2015   COLONOSCOPY WITH PROPOFOL N/A 06/05/2016   Procedure: COLONOSCOPY WITH PROPOFOL;  Surgeon: Garlan Fair, MD;  Location: [facility];  Service: Endoscopy;  Laterality: N/A;   CORONARY ANGIOGRAPHY N/A 12/26/2016   Procedure: CORONARY ANGIOGRAPHY;  Surgeon: Troy Sine, MD;  Location: [place] [facility];  Service: Cardiovascular;  Laterality: N/A;   CORONARY STENT INTERVENTION N/A 12/25/2016   Procedure: CORONARY STENT INTERVENTION;  Surgeon: Burnell Blanks, MD;  Location: [place] [facility];  Service: Cardiovascular;  Laterality: N/A;   CORONARY STENT INTERVENTION N/A 12/26/2016   Procedure: CORONARY STENT INTERVENTION;  Surgeon: Troy Sine, MD;  Location: [place] [facility];  Service: Cardiovascular;  Laterality: N/A;   CYSTOSCOPY/URETEROSCOPY/HOLMIUM LASER/STENT PLACEMENT Right 01/07/2018   Procedure: RIGHT URETEROSCOPY/HOLMIUM LASER/STENT PLACEMENT;  Surgeon: Lucas Mallow, MD;  Location: [facility];  Service: Urology;  Laterality: Right;   ESOPHAGOGASTRODUODENOSCOPY (EGD) WITH PROPOFOL N/A 01/12/2017   Procedure:  ESOPHAGOGASTRODUODENOSCOPY (EGD) WITH PROPOFOL;  Surgeon: Wilford Corner, MD;  Location: [place];  Service: Endoscopy;  Laterality: N/A;   HAND TENDON SURGERY Right 10-29-2002   dr Burney Gauze @[facility]    right index and thumb   KNEE ARTHROSCOPY Bilateral 2009-;2010   @[facility]    LEFT HEART CATH AND CORONARY ANGIOGRAPHY N/A 12/25/2016   Procedure: LEFT HEART CATH AND CORONARY ANGIOGRAPHY;  Surgeon: Burnell Blanks, MD;  Location: [place] [facility];  Service: Cardiovascular;  Laterality: N/A;   LEFT HEART CATH AND CORONARY ANGIOGRAPHY N/A 10/10/2019   Procedure: LEFT HEART CATH AND CORONARY ANGIOGRAPHY;  Surgeon: Troy Sine, MD;  Location: [place] [facility];  Service: Cardiovascular;  Laterality: N/A;   LUMBAR LAMINECTOMY/DECOMPRESSION MICRODISCECTOMY N/A 06/09/2015   Procedure: Laminectomy - T12-L1;  Surgeon: Eustace Moore, MD;  Location: [place] [facility];  Service: Neurosurgery;  Laterality: N/A;  Laminectomy - T12-L1   MEDIAL PARTIAL KNEE REPLACEMENT Bilateral 2009-2010    [facility]    MELANOMA EXCISION Right    "side of my nose"   SHOULDER SURGERY Left 1985   TOTAL SHOULDER ARTHROPLASTY Left 12/06/2012   Procedure: LEFT TOTAL SHOULDER ARTHROPLASTY VERSES A REVERSE TOTAL SHOULDER ARTHROPLASTY;  Surgeon: Augustin Schooling, MD;  Location: [place];  Service: Orthopedics;  Laterality: Left;    Prior to Admission medications   Medication Sig Start Date End Date Taking? Authorizing Provider  acetaminophen (TYLENOL) 325 MG tablet Take 2 tablets (650 mg total) by mouth every 6 (six) hours as needed for mild pain or headache. 01/13/17  Yes Kilroy, Doreene Burke, PA-C  Alogliptin Benzoate 12.5 MG TABS Take 12.5 mg by mouth daily. 11/19/19  Yes Nida, Marella Chimes, MD  amLODipine (NORVASC) 10 MG tablet Take 5 mg by mouth in the morning and at bedtime.   Yes [provider]  amoxicillin (AMOXIL) 500 MG capsule Take 2,000 mg by mouth See admin instructions. Take 2000 mg by mouth 1 hour prior to  dental work 11/19/19  Yes [provider]  clopidogrel (PLAVIX) 75 MG tablet Take 1 tablet (75 mg total) by mouth daily. 10/31/17  Yes Burnell Blanks, MD  insulin aspart protamine - aspart (NOVOLOG 70/30 FLEXPEN RELION) (70-30) 100 UNIT/ML FlexPen Inject 10 Units into the skin 2 (two) times daily before a meal.   Yes [provider]  iron polysaccharides (NIFEREX) 150 MG capsule Take 150 mg by mouth daily.   Yes [provider]  isosorbide mononitrate (IMDUR) 60 MG 24 hr tablet Take 1.5 tablets (90 mg total) by mouth daily. Patient taking differently: Take 60 mg by mouth daily. 11/18/19  Yes Imogene Burn, PA-C  losartan (COZAAR) 50 MG tablet Take 1 tablet (50 mg total) by mouth daily. Patient taking differently: Take 50 mg by mouth at bedtime. 01/14/20  Yes Cassandria Anger, MD  metFORMIN (GLUCOPHAGE XR) 500 MG 24 hr tablet Take 1 tablet (500 mg total) by mouth daily with breakfast. 02/10/20  Yes Nida, Marella Chimes, MD  metoprolol tartrate (LOPRESSOR) 25 MG tablet TAKE 1 TABLET  BY MOUTH TWICE A DAY Patient taking differently: Take 12.5 mg by mouth 2 (two) times daily. 01/18/18  Yes Lyda Jester M, PA-C  nitroGLYCERIN (NITROSTAT) 0.4 MG SL tablet Place 1 tablet (0.4 mg total) under the tongue every 5 (five) minutes as needed for chest pain. 12/27/16  Yes Rama, Venetia Maxon, MD  Omega-3 Fatty Acids (FISH OIL) 1000 MG CAPS Take 1,000 mg by mouth daily.   Yes [provider]  pantoprazole (PROTONIX) 40 MG tablet Take 40 mg by mouth daily.   Yes [provider]  TURMERIC PO Take 1,500 mg by mouth 2 (two) times daily.   Yes [provider]  Vitamin D, Cholecalciferol, 25 MCG (1000 UT) CAPS Take 1,000 Units by mouth daily.    Yes [provider]  Zinc 50 MG CAPS Take 50 mg by mouth daily.   Yes [provider]  Continuous Blood Gluc Receiver (FREESTYLE LIBRE 2 READER) DEVI As directed 03/31/20   Cassandria Anger, MD  Continuous Blood Gluc Sensor (FREESTYLE LIBRE 2 SENSOR) MISC 1 Piece by Does not apply route every 14 (fourteen) days. 06/23/20   Cassandria Anger, MD    Allergies  Allergen Reactions   Statins Other (See Comments)    Severe stomach pain.   Oxycodone Itching   Hydrocodone Other (See Comments)    "messes with my hearing"   Codeine Itching   Morphine And Related Itching    Social History   Socioeconomic History   Marital status: Married    Spouse name: Not on file   Number of children: Not on file   Years of education: Not on file   Highest education level: Not on file  Occupational History   Not on file  Tobacco Use   Smoking status: Never   Smokeless tobacco: Never  Vaping Use   Vaping Use: Never used  Substance and Sexual Activity   Alcohol use: No   Drug use: No   Sexual activity: Yes  Other Topics Concern   Not on file  Social History Narrative   Not on file   Social Determinants of Health   Financial Resource Strain: Not on file  Food Insecurity: Not on file  Transportation Needs: Not on file  Physical Activity: Not on file  Stress: Not on file  Social Connections: Not on file  Intimate Partner Violence: Not on file    Tobacco Use: Low Risk    Smoking Tobacco Use: Never   Smokeless Tobacco Use: Never   Social History   Substance and Sexual Activity  Alcohol Use No    Family History  Problem Relation Age of Onset   CAD Father    Heart failure Father    CAD Sister    Stroke Mother    Heart failure Mother     Review of Systems  Constitutional:  Negative for chills and fever.  HENT:  Negative for congestion, sore throat and tinnitus.   Eyes:  Negative for double vision, photophobia and pain.  Respiratory:  Negative for cough, shortness of breath and wheezing.   Cardiovascular:  Negative for chest pain, palpitations and orthopnea.  Gastrointestinal:  Negative for heartburn, nausea and vomiting.  Genitourinary:  Negative for  dysuria, frequency and urgency.  Musculoskeletal:  Positive for joint pain.  Neurological:  Negative for dizziness, weakness and headaches.   Objective:  Physical Exam: Well nourished and well developed.  General: Alert and oriented x3, cooperative and pleasant, no acute distress.  Head: normocephalic,  atraumatic, neck supple.  Eyes: EOMI.  Respiratory: breath sounds clear in all fields, no wheezing, rales, or rhonchi. Cardiovascular: Regular rate and rhythm, no murmurs, gallops or rubs.  Abdomen: non-tender to palpation and soft, normoactive bowel sounds. Musculoskeletal:  Right Knee Exam:  Moderate effusion present. No warmth.  The range of motion is: 5 to 125 degrees.  Marked crepitus on range of motion of the knee.  Significant medial joint line tenderness.  No lateral joint line tenderness.  The knee is stable.   Calves soft and nontender. Motor function intact in LE. Strength 5/5 LE bilaterally. Neuro: Distal pulses 2+. Sensation to light touch intact in LE.   Imaging Review AP and lateral of the bilateral knees dated 06/26/2019 demonstrate previous unicompartmental arthroplasties medially on both sides. On the right, the polyethylene is completely worn out. It is also bone-on-bone in the lateral and patellofemoral compartments.  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 The treatment options including medical management, injection therapy arthroscopy and arthroplasty were discussed at length. The risks and benefits of total knee arthroplasty were presented and reviewed. The risks due to aseptic loosening, infection, stiffness, patella tracking problems, thromboembolic complications and other imponderables were discussed.  The patient acknowledged the explanation, agreed to proceed with the plan and consent was signed. Patient is being admitted for inpatient treatment for surgery, pain control, PT, OT, prophylactic antibiotics, VTE prophylaxis, progressive ambulation and ADLs and discharge planning. The patient is planning to be discharged  home .  Therapy Plans: Outpatient therapy at [facility]) Disposition: Home with wife Planned DVT Prophylaxis: Plavix 75 mg + 325 mg ASA QD DME Needed: None PCP: Wenda Low, MD (clearance received) Cardiologist: Lauree Chandler, MD (clearance received) TXA: IV Allergies: Codeine (itching), morphine (itching) Anesthesia Concerns: Woke up during right uni BMI: 31.6 Last HgbA1c: 6.8% Pharmacy: [facility]   Other: - History of infection following both previous unicompartmental arthroplasty surgeries - Wants medications delivered to room prior to discharge (send to [place] and notify nurse) - Does not take ASA with plavix (cleared to hold 5 days prior to surgery per Dr. Angelena Form) - Hx CAD with stent placement  - Patient was instructed on what medications to stop prior to surgery. - Follow-up visit in 2 weeks with Dr. Wynelle Link - Begin physical therapy following surgery - Pre-operative lab work as pre-surgical testing - Prescriptions will be provided in hospital at time of discharge  Theresa Duty, PA-C Orthopedic Surgery [facility] [place]

## 2020-08-19 NOTE — Progress Notes (Signed)
Anesthesia Chart Review   Case: [id number] Date/Time: 08/25/20 1015   Procedure: Revision right knee unicompartmental arthroplasty to total knee arthroplasty (Right: Knee)   Anesthesia type: Choice   Pre-op diagnosis: Failed right knee unicompartmental replacement   Location: [facility] 09 / [facility]   Surgeons: Gaynelle Arabian, MD       DISCUSSION:80 y.o. never smoker with h/o HTN, GERD, CKD Stage III, CAD (DES 2018), RBBB, DM II, failed right knee unicompartmental replacement scheduled for above procedure 08/25/2020 with Dr. Gaynelle Arabian.   Per cardiology preoperative evaluation 06/11/2020, "Chart reviewed as part of pre-operative protocol coverage. Patient was contacted 06/11/2020 in reference to pre-operative risk assessment for pending surgery as outlined below.  Matthew [name] was last seen on 11/18/19 by  Ermalinda Barrios, PA-C.  Since that day, Matthew [name] has done fine from a cardiac standpoint. He is able to complete 4 METs without anginal complaints. Therefore, based on ACC/AHA guidelines, the patient would be at acceptable risk for the planned procedure without further cardiovascular testing. The patient was advised that if he develops new symptoms prior to surgery to contact our office to arrange for a follow-up visit, and he verbalized understanding. Per previous recommendation by Dr. Angelena Form, and given lack of interval change in cardiac history, patient can hold plavix 5 days prior to his upcoming surgery with plans to restart when cleared to do so by his orthopedist."  Anticipate pt can proceed with planned procedure barring acute status change.   VS: BP 137/68   Pulse 76   Temp 36.9 C (Oral)   Resp 18   Ht 5\' 8"  (1.727 m)   Wt 90.3 kg   SpO2 99%   BMI 30.26 kg/m   PROVIDERS: Wenda Low, MD is PCP   Lauree Chandler, MD LABS: Labs reviewed: Acceptable for surgery. (all labs ordered are listed, but only abnormal results are displayed)  Labs Reviewed  HEMOGLOBIN  A1C - Abnormal; Notable for the following components:      Result Value   Hgb A1c MFr Bld 7.0 (*)    All other components within normal limits  COMPREHENSIVE METABOLIC PANEL - Abnormal; Notable for the following components:   Glucose, Bld 156 (*)    Creatinine, Ser 1.37 (*)    GFR, Estimated 53 (*)    All other components within normal limits  GLUCOSE, CAPILLARY - Abnormal; Notable for the following components:   Glucose-Capillary 180 (*)    All other components within normal limits  SURGICAL PCR SCREEN  CBC  PROTIME-INR  TYPE AND SCREEN     IMAGES:   EKG: 10/08/2019 Rate 66 bpm  Sinus  RBBB  CV: Cardiac Cath 10/10/2019 Mid Cx lesion is 70% stenosed. 1st Diag lesion is 40% stenosed. Previously placed Prox LAD to Mid LAD stent (unknown type) is widely patent. Previously placed Prox RCA to Mid RCA stent (unknown type) is widely patent. RV Branch lesion is 95% stenosed. Prox RCA lesion is 50% stenosed. RPDA lesion is 60% stenosed. 3rd RPL lesion is 20% stenosed. The left ventricular systolic function is normal. LV end diastolic pressure is normal. The left ventricular ejection fraction is 55-65% by visual estimate.   Very long left main coronary artery which gives rise to a large LAD and very small caliber circumflex vessel.   The previously placed tandem stents in the LAD are widely patent.  There is mild 40% narrowing in a diagonal vessel proximal to the stented segment.     The left  circumflex vessel is very small caliber with narrowing 70- 75% proximally which does not appear to be significantly change from the prior study.   The RCA is a large dominant vessel.  The stent in the proximal to mid RCA is widely patent.  The very proximal RCA has 50% narrowing and gives rise to a SA nodal artery.  There is 90-95% ostial stenosis in this small proximal branch, the PDA has previously noted diffuse 60% proximal to mid stenosis, and the PLA vessel site of prior PTCA is patent  with residual narrowing less than 20%.   Normal LV function with EF estimate approximately 60%.  LVEDP 15 mmHg.   RECOMMENDATION: There is no significant change in the previously stented segments of the LAD and RCA with patent PTCA site of the PLA vessel..  There does appear to be progression of disease in the very proximal RCA which gives rise to a small branch with 90-95% ostial stenosis.  Circumflex vessel is very small caliber vessel.  Recommend increase medical therapy trial; your had just been increased to 90 mg daily.  Depending upon heart rate, consider further titration of beta-blocker therapy.  The patient is on PCSK9 inhibition in addition to Zetia for aggressive lipid-lowering.   Stress Test 04/15/2018 Nuclear stress EF: 69%. There was no ST segment deviation noted during stress. No T wave inversion was noted during stress. This is a low risk study. The left ventricular ejection fraction is hyperdynamic (>65%).  Echo 12/26/2016 Study Conclusions   - Left ventricle: The cavity size was normal. Wall thickness was    normal. Systolic function was normal. The estimated ejection    fraction was in the range of 60% to 65%. Wall motion was normal;    there were no regional wall motion abnormalities. Left    ventricular diastolic function parameters were normal.  - Mitral valve: Calcified annulus.  - Atrial septum: No defect or patent foramen ovale was identified.  - Pulmonary arteries: PA peak pressure: 36 mm Hg (S).  Past Medical History:  Diagnosis Date   Anticoagulated    plavix   Arthritis    Shoulder, knees, back    CAD in native artery cardiologist-  dr Angelena Form   a. CAD/NSTEMI ,  cardiac cath staged stenting-- 12-25-2016  PTCA and DES x3 to prox. and mid LAD;  12-26-2016  PCI to PLA and DES x1 to midRCA,  EF 60-65%.   Chronic lower back pain    CKD (chronic kidney disease), stage III (HCC)    Complication of anesthesia    "Too much with shoulder surgery", pt. reports  that he was told the at they "lost him, due to absorbing too much anesthesia".  shoulder surgery 1985   DDD (degenerative disc disease), lumbosacral    GERD (gastroesophageal reflux disease)    History of gastric ulcer 01/2017   History of kidney stones    History of malignant melanoma    right side of nose   History of non-ST elevation myocardial infarction (NSTEMI) 12/23/2017   s/p  staged cardiac cath,  s/p  PCI and DEStenting   Hyperlipidemia    Hypertension    IDA (iron deficiency anemia)    Myocardial infarction ([place])    Nocturia    RBBB    Renal calculus, right    S/P drug eluting coronary stent placement 12-25-2017,  12-26-2017   PTCA and DES x3 to prox. and mid LAD;  PCI to PLA and DES x1 to [facility]  Type 2 diabetes mellitus treated with insulin ([place])    followed by pcp    Past Surgical History:  Procedure Laterality Date   ANAL FISSURE REPAIR  X 2   CATARACT EXTRACTION W/ INTRAOCULAR LENS  IMPLANT, BILATERAL Bilateral 2017;  2015   COLONOSCOPY WITH PROPOFOL N/A 06/05/2016   Procedure: COLONOSCOPY WITH PROPOFOL;  Surgeon: Garlan Fair, MD;  Location: [facility];  Service: Endoscopy;  Laterality: N/A;   CORONARY ANGIOGRAPHY N/A 12/26/2016   Procedure: CORONARY ANGIOGRAPHY;  Surgeon: Troy Sine, MD;  Location: [place] [facility];  Service: Cardiovascular;  Laterality: N/A;   CORONARY STENT INTERVENTION N/A 12/25/2016   Procedure: CORONARY STENT INTERVENTION;  Surgeon: Burnell Blanks, MD;  Location: [place] [facility] LAB;  Service: Cardiovascular;  Laterality: N/A;   CORONARY STENT INTERVENTION N/A 12/26/2016   Procedure: CORONARY STENT INTERVENTION;  Surgeon: Troy Sine, MD;  Location: [place] [facility];  Service: Cardiovascular;  Laterality: N/A;   CYSTOSCOPY/URETEROSCOPY/HOLMIUM LASER/STENT PLACEMENT Right 01/07/2018   Procedure: RIGHT URETEROSCOPY/HOLMIUM LASER/STENT PLACEMENT;  Surgeon: Lucas Mallow, MD;  Location: [facility];   Service: Urology;  Laterality: Right;   ESOPHAGOGASTRODUODENOSCOPY (EGD) WITH PROPOFOL N/A 01/12/2017   Procedure: ESOPHAGOGASTRODUODENOSCOPY (EGD) WITH PROPOFOL;  Surgeon: Wilford Corner, MD;  Location: [place];  Service: Endoscopy;  Laterality: N/A;   HAND TENDON SURGERY Right 10-29-2002   dr Burney Gauze @[facility]    right index and thumb   KNEE ARTHROSCOPY Bilateral 2009-;2010   @[facility]    LEFT HEART CATH AND CORONARY ANGIOGRAPHY N/A 12/25/2016   Procedure: LEFT HEART CATH AND CORONARY ANGIOGRAPHY;  Surgeon: Burnell Blanks, MD;  Location: [place] [facility];  Service: Cardiovascular;  Laterality: N/A;   LEFT HEART CATH AND CORONARY ANGIOGRAPHY N/A 10/10/2019   Procedure: LEFT HEART CATH AND CORONARY ANGIOGRAPHY;  Surgeon: Troy Sine, MD;  Location: [place] [facility];  Service: Cardiovascular;  Laterality: N/A;   LUMBAR LAMINECTOMY/DECOMPRESSION MICRODISCECTOMY N/A 06/09/2015   Procedure: Laminectomy - T12-L1;  Surgeon: Eustace Moore, MD;  Location: [place] [facility];  Service: Neurosurgery;  Laterality: N/A;  Laminectomy - T12-L1   MEDIAL PARTIAL KNEE REPLACEMENT Bilateral 2009-2010    [facility]    MELANOMA EXCISION Right    "side of my nose"   SHOULDER SURGERY Left 1985   TOTAL SHOULDER ARTHROPLASTY Left 12/06/2012   Procedure: LEFT TOTAL SHOULDER ARTHROPLASTY VERSES A REVERSE TOTAL SHOULDER ARTHROPLASTY;  Surgeon: Augustin Schooling, MD;  Location: [place];  Service: Orthopedics;  Laterality: Left;    MEDICATIONS:  acetaminophen (TYLENOL) 325 MG tablet   Alogliptin Benzoate 12.5 MG TABS   amLODipine (NORVASC) 10 MG tablet   amoxicillin (AMOXIL) 500 MG capsule   clopidogrel (PLAVIX) 75 MG tablet   Continuous Blood Gluc Receiver (FREESTYLE LIBRE 2 READER) DEVI   Continuous Blood Gluc Sensor (FREESTYLE LIBRE 2 SENSOR) MISC   insulin aspart protamine - aspart (NOVOLOG 70/30 FLEXPEN RELION) (70-30) 100 UNIT/ML FlexPen   iron polysaccharides (NIFEREX) 150 MG capsule   isosorbide  mononitrate (IMDUR) 60 MG 24 hr tablet   losartan (COZAAR) 50 MG tablet   metFORMIN (GLUCOPHAGE XR) 500 MG 24 hr tablet   metoprolol tartrate (LOPRESSOR) 25 MG tablet   nitroGLYCERIN (NITROSTAT) 0.4 MG SL tablet   Omega-3 Fatty Acids (FISH OIL) 1000 MG CAPS   pantoprazole (PROTONIX) 40 MG tablet   TURMERIC PO   Vitamin D, Cholecalciferol, 25 MCG (1000 UT) CAPS   Zinc 50 MG CAPS   No current  facility-administered medications for this encounter.     Konrad Felix, PA-C [facility]Surgical Testing [phone]

## 2020-08-19 NOTE — Anesthesia Preprocedure Evaluation (Addendum)
Anesthesia Evaluation  Patient identified by MRN, date of birth, ID band Patient awake    Reviewed: Allergy & Precautions, NPO status , Patient's Chart, lab work & pertinent test results, reviewed documented beta blocker date and time   History of Anesthesia Complications Negative for: history of anesthetic complications  Airway Mallampati: II  TM Distance: >3 FB Neck ROM: Full    Dental  (+) Dental Advisory Given   Pulmonary  08/24/2020 SARS coronavirus NEG   breath sounds clear to auscultation       Cardiovascular hypertension, Pt. on medications and Pt. on home beta blockers (-) angina+ CAD, + Past MI, + Cardiac Stents and + Peripheral Vascular Disease   Rhythm:Regular Rate:Normal  '21 Cath: EF 60% with normal LVF, There is no significant change in the previously stented segments of the LAD and RCA with patent PTCA site of the PLA vessel..  There does appear to be progression of disease in the very proximal RCA which gives rise to a small branch with 90-95% ostial stenosis.  Circumflex vessel is very small caliber vessel. Medical management  '20 Nuclear stress EF: 69%. There was no ST segment deviation noted during stress. No T wave inversion was noted during stress. This is a low risk study. EF is hyperdynamic (>65%).      Neuro/Psych negative neurological ROS     GI/Hepatic Neg liver ROS, GERD  Controlled,  Endo/Other  diabetes (glu 176), Insulin Dependentobese  Renal/GU Renal InsufficiencyRenal disease (creat 1.37)     Musculoskeletal  (+) Arthritis ,   Abdominal (+) + obese,   Peds  Hematology negative hematology ROS (+) plavix   Anesthesia Other Findings   Reproductive/Obstetrics                           Anesthesia Physical Anesthesia Plan  ASA: 3  Anesthesia Plan: Spinal   Post-op Pain Management:  Regional for Post-op pain   Induction:   PONV Risk Score and Plan: 1  and Ondansetron  Airway Management Planned: Natural Airway and Simple Face Mask  Additional Equipment: None  Intra-op Plan:   Post-operative Plan:   Informed Consent: I have reviewed the patients History and Physical, chart, labs and discussed the procedure including the risks, benefits and alternatives for the proposed anesthesia with the patient or authorized representative who has indicated his/her understanding and acceptance.     Dental advisory given  Plan Discussed with: CRNA and Surgeon  Anesthesia Plan Comments: (See PAT note 08/17/2020, Konrad Felix, PA-C Plan routine monitors, SAB with adductor canal block for post op analgesia)      Anesthesia Quick Evaluation

## 2020-08-23 ENCOUNTER — Other Ambulatory Visit (HOSPITAL_COMMUNITY): Payer: Medicare Other

## 2020-08-24 ENCOUNTER — Other Ambulatory Visit (HOSPITAL_COMMUNITY)
Admission: RE | Admit: 2020-08-24 | Discharge: 2020-08-24 | Disposition: A | Discharge status: Home / Self Care | Payer: Medicare Other | Source: Ambulatory Visit | Attending: Orthopedic Surgery | Admitting: Orthopedic Surgery

## 2020-08-24 DIAGNOSIS — I4510 Unspecified right bundle-branch block: Secondary | ICD-10-CM | POA: Diagnosis not present

## 2020-08-24 DIAGNOSIS — E1122 Type 2 diabetes mellitus with diabetic chronic kidney disease: Secondary | ICD-10-CM | POA: Diagnosis not present

## 2020-08-24 DIAGNOSIS — Z87442 Personal history of urinary calculi: Secondary | ICD-10-CM | POA: Diagnosis not present

## 2020-08-24 DIAGNOSIS — T8484XA Pain due to internal orthopedic prosthetic devices, implants and grafts, initial encounter: Secondary | ICD-10-CM | POA: Diagnosis not present

## 2020-08-24 DIAGNOSIS — Z20822 Contact with and (suspected) exposure to covid-19: Secondary | ICD-10-CM | POA: Insufficient documentation

## 2020-08-24 DIAGNOSIS — E669 Obesity, unspecified: Secondary | ICD-10-CM | POA: Diagnosis present

## 2020-08-24 DIAGNOSIS — Z794 Long term (current) use of insulin: Secondary | ICD-10-CM | POA: Diagnosis not present

## 2020-08-24 DIAGNOSIS — Z8711 Personal history of peptic ulcer disease: Secondary | ICD-10-CM | POA: Diagnosis not present

## 2020-08-24 DIAGNOSIS — Z885 Allergy status to narcotic agent status: Secondary | ICD-10-CM | POA: Diagnosis not present

## 2020-08-24 DIAGNOSIS — E1151 Type 2 diabetes mellitus with diabetic peripheral angiopathy without gangrene: Secondary | ICD-10-CM | POA: Diagnosis not present

## 2020-08-24 DIAGNOSIS — Y792 Prosthetic and other implants, materials and accessory orthopedic devices associated with adverse incidents: Secondary | ICD-10-CM | POA: Diagnosis present

## 2020-08-24 DIAGNOSIS — Z01812 Encounter for preprocedural laboratory examination: Secondary | ICD-10-CM | POA: Insufficient documentation

## 2020-08-24 DIAGNOSIS — Z9842 Cataract extraction status, left eye: Secondary | ICD-10-CM | POA: Diagnosis not present

## 2020-08-24 DIAGNOSIS — Z6831 Body mass index (BMI) 31.0-31.9, adult: Secondary | ICD-10-CM | POA: Diagnosis not present

## 2020-08-24 DIAGNOSIS — Z955 Presence of coronary angioplasty implant and graft: Secondary | ICD-10-CM | POA: Diagnosis not present

## 2020-08-24 DIAGNOSIS — I129 Hypertensive chronic kidney disease with stage 1 through stage 4 chronic kidney disease, or unspecified chronic kidney disease: Secondary | ICD-10-CM | POA: Diagnosis not present

## 2020-08-24 DIAGNOSIS — M1711 Unilateral primary osteoarthritis, right knee: Secondary | ICD-10-CM | POA: Diagnosis not present

## 2020-08-24 DIAGNOSIS — T84092A Other mechanical complication of internal right knee prosthesis, initial encounter: Secondary | ICD-10-CM | POA: Diagnosis not present

## 2020-08-24 DIAGNOSIS — T84012A Broken internal right knee prosthesis, initial encounter: Secondary | ICD-10-CM | POA: Diagnosis not present

## 2020-08-24 DIAGNOSIS — Z85820 Personal history of malignant melanoma of skin: Secondary | ICD-10-CM | POA: Diagnosis not present

## 2020-08-24 DIAGNOSIS — Z96651 Presence of right artificial knee joint: Secondary | ICD-10-CM | POA: Diagnosis not present

## 2020-08-24 DIAGNOSIS — K219 Gastro-esophageal reflux disease without esophagitis: Secondary | ICD-10-CM | POA: Diagnosis not present

## 2020-08-24 DIAGNOSIS — Z96611 Presence of right artificial shoulder joint: Secondary | ICD-10-CM | POA: Diagnosis not present

## 2020-08-24 DIAGNOSIS — I252 Old myocardial infarction: Secondary | ICD-10-CM | POA: Diagnosis not present

## 2020-08-24 DIAGNOSIS — I2510 Atherosclerotic heart disease of native coronary artery without angina pectoris: Secondary | ICD-10-CM | POA: Diagnosis not present

## 2020-08-24 DIAGNOSIS — Z9841 Cataract extraction status, right eye: Secondary | ICD-10-CM | POA: Diagnosis not present

## 2020-08-24 DIAGNOSIS — Z961 Presence of intraocular lens: Secondary | ICD-10-CM | POA: Diagnosis not present

## 2020-08-24 DIAGNOSIS — E782 Mixed hyperlipidemia: Secondary | ICD-10-CM | POA: Diagnosis not present

## 2020-08-24 DIAGNOSIS — N183 Chronic kidney disease, stage 3 unspecified: Secondary | ICD-10-CM | POA: Diagnosis not present

## 2020-08-24 DIAGNOSIS — G8918 Other acute postprocedural pain: Secondary | ICD-10-CM | POA: Diagnosis not present

## 2020-08-24 DIAGNOSIS — Z888 Allergy status to other drugs, medicaments and biological substances status: Secondary | ICD-10-CM | POA: Diagnosis not present

## 2020-08-24 DIAGNOSIS — M25761 Osteophyte, right knee: Secondary | ICD-10-CM | POA: Diagnosis not present

## 2020-08-24 LAB — SARS CORONAVIRUS 2 (TAT 6-24 HRS): SARS Coronavirus 2: NEGATIVE

## 2020-08-24 MED ORDER — BUPIVACAINE LIPOSOME 1.3 % IJ SUSP
20.0000 mL | Freq: Once | INTRAMUSCULAR | Status: DC
  Filled 2020-08-24: qty 20

## 2020-08-25 ENCOUNTER — Inpatient Hospital Stay (HOSPITAL_COMMUNITY): Payer: Medicare Other | Admitting: Certified Registered Nurse Anesthetist

## 2020-08-25 ENCOUNTER — Inpatient Hospital Stay (HOSPITAL_COMMUNITY): Payer: Medicare Other | Admitting: Physician Assistant

## 2020-08-25 ENCOUNTER — Encounter (HOSPITAL_COMMUNITY)
Admission: RE | Disposition: A | Discharge status: Home / Self Care | Payer: Self-pay | Source: Home / Self Care | Attending: Orthopedic Surgery

## 2020-08-25 ENCOUNTER — Encounter (HOSPITAL_COMMUNITY): Payer: Self-pay | Admitting: Orthopedic Surgery

## 2020-08-25 ENCOUNTER — Inpatient Hospital Stay (HOSPITAL_COMMUNITY)
Admission: RE | Admit: 2020-08-25 | Discharge: 2020-08-26 | DRG: 468 | Disposition: A | Discharge status: Home / Self Care | Payer: Medicare Other | Attending: Orthopedic Surgery | Admitting: Orthopedic Surgery

## 2020-08-25 ENCOUNTER — Other Ambulatory Visit: Payer: Self-pay

## 2020-08-25 DIAGNOSIS — T84092A Other mechanical complication of internal right knee prosthesis, initial encounter: Secondary | ICD-10-CM | POA: Diagnosis present

## 2020-08-25 DIAGNOSIS — Z794 Long term (current) use of insulin: Secondary | ICD-10-CM

## 2020-08-25 DIAGNOSIS — Z888 Allergy status to other drugs, medicaments and biological substances status: Secondary | ICD-10-CM

## 2020-08-25 DIAGNOSIS — I4510 Unspecified right bundle-branch block: Secondary | ICD-10-CM | POA: Diagnosis not present

## 2020-08-25 DIAGNOSIS — Y792 Prosthetic and other implants, materials and accessory orthopedic devices associated with adverse incidents: Secondary | ICD-10-CM | POA: Diagnosis present

## 2020-08-25 DIAGNOSIS — Z20822 Contact with and (suspected) exposure to covid-19: Secondary | ICD-10-CM | POA: Diagnosis present

## 2020-08-25 DIAGNOSIS — E1122 Type 2 diabetes mellitus with diabetic chronic kidney disease: Secondary | ICD-10-CM | POA: Diagnosis present

## 2020-08-25 DIAGNOSIS — M25761 Osteophyte, right knee: Secondary | ICD-10-CM | POA: Diagnosis present

## 2020-08-25 DIAGNOSIS — Z8711 Personal history of peptic ulcer disease: Secondary | ICD-10-CM

## 2020-08-25 DIAGNOSIS — N183 Chronic kidney disease, stage 3 unspecified: Secondary | ICD-10-CM | POA: Diagnosis present

## 2020-08-25 DIAGNOSIS — Z961 Presence of intraocular lens: Secondary | ICD-10-CM | POA: Diagnosis present

## 2020-08-25 DIAGNOSIS — E1151 Type 2 diabetes mellitus with diabetic peripheral angiopathy without gangrene: Secondary | ICD-10-CM | POA: Diagnosis present

## 2020-08-25 DIAGNOSIS — Z9842 Cataract extraction status, left eye: Secondary | ICD-10-CM | POA: Diagnosis not present

## 2020-08-25 DIAGNOSIS — T8484XA Pain due to internal orthopedic prosthetic devices, implants and grafts, initial encounter: Secondary | ICD-10-CM | POA: Diagnosis present

## 2020-08-25 DIAGNOSIS — I2510 Atherosclerotic heart disease of native coronary artery without angina pectoris: Secondary | ICD-10-CM | POA: Diagnosis present

## 2020-08-25 DIAGNOSIS — Z7902 Long term (current) use of antithrombotics/antiplatelets: Secondary | ICD-10-CM

## 2020-08-25 DIAGNOSIS — K219 Gastro-esophageal reflux disease without esophagitis: Secondary | ICD-10-CM | POA: Diagnosis present

## 2020-08-25 DIAGNOSIS — Z6831 Body mass index (BMI) 31.0-31.9, adult: Secondary | ICD-10-CM | POA: Diagnosis not present

## 2020-08-25 DIAGNOSIS — Z96659 Presence of unspecified artificial knee joint: Secondary | ICD-10-CM

## 2020-08-25 DIAGNOSIS — E782 Mixed hyperlipidemia: Secondary | ICD-10-CM | POA: Diagnosis not present

## 2020-08-25 DIAGNOSIS — Z885 Allergy status to narcotic agent status: Secondary | ICD-10-CM

## 2020-08-25 DIAGNOSIS — Z9841 Cataract extraction status, right eye: Secondary | ICD-10-CM | POA: Diagnosis not present

## 2020-08-25 DIAGNOSIS — Z87442 Personal history of urinary calculi: Secondary | ICD-10-CM | POA: Diagnosis not present

## 2020-08-25 DIAGNOSIS — T84012A Broken internal right knee prosthesis, initial encounter: Secondary | ICD-10-CM | POA: Diagnosis not present

## 2020-08-25 DIAGNOSIS — E669 Obesity, unspecified: Secondary | ICD-10-CM | POA: Diagnosis present

## 2020-08-25 DIAGNOSIS — Z85820 Personal history of malignant melanoma of skin: Secondary | ICD-10-CM | POA: Diagnosis not present

## 2020-08-25 DIAGNOSIS — I252 Old myocardial infarction: Secondary | ICD-10-CM

## 2020-08-25 DIAGNOSIS — Z96651 Presence of right artificial knee joint: Secondary | ICD-10-CM | POA: Diagnosis not present

## 2020-08-25 DIAGNOSIS — Z7984 Long term (current) use of oral hypoglycemic drugs: Secondary | ICD-10-CM

## 2020-08-25 DIAGNOSIS — Z96611 Presence of right artificial shoulder joint: Secondary | ICD-10-CM | POA: Diagnosis present

## 2020-08-25 DIAGNOSIS — Z955 Presence of coronary angioplasty implant and graft: Secondary | ICD-10-CM | POA: Diagnosis not present

## 2020-08-25 DIAGNOSIS — I129 Hypertensive chronic kidney disease with stage 1 through stage 4 chronic kidney disease, or unspecified chronic kidney disease: Secondary | ICD-10-CM | POA: Diagnosis present

## 2020-08-25 DIAGNOSIS — G8918 Other acute postprocedural pain: Secondary | ICD-10-CM | POA: Diagnosis not present

## 2020-08-25 DIAGNOSIS — Z823 Family history of stroke: Secondary | ICD-10-CM

## 2020-08-25 DIAGNOSIS — Z8249 Family history of ischemic heart disease and other diseases of the circulatory system: Secondary | ICD-10-CM

## 2020-08-25 DIAGNOSIS — Z79899 Other long term (current) drug therapy: Secondary | ICD-10-CM

## 2020-08-25 DIAGNOSIS — M1711 Unilateral primary osteoarthritis, right knee: Secondary | ICD-10-CM | POA: Diagnosis not present

## 2020-08-25 HISTORY — PX: CONVERSION TO TOTAL KNEE: SHX5785

## 2020-08-25 LAB — TYPE AND SCREEN
ABO/RH(D): O POS
Antibody Screen: NEGATIVE

## 2020-08-25 LAB — GLUCOSE, CAPILLARY
Glucose-Capillary: 176 mg/dL — ABNORMAL HIGH (ref 70–99)
Glucose-Capillary: 215 mg/dL — ABNORMAL HIGH (ref 70–99)
Glucose-Capillary: 230 mg/dL — ABNORMAL HIGH (ref 70–99)

## 2020-08-25 SURGERY — CONVERSION, ARTHROPLASTY, KNEE, PARTIAL, TO TOTAL KNEE ARTHROPLASTY
Anesthesia: General | Site: Knee | Laterality: Right

## 2020-08-25 MED ORDER — ONDANSETRON HCL 4 MG PO TABS: 4.0000 mg | ORAL_TABLET | Freq: Four times a day (QID) | ORAL | Status: DC | PRN

## 2020-08-25 MED ORDER — DEXAMETHASONE SODIUM PHOSPHATE 10 MG/ML IJ SOLN
10.0000 mg | Freq: Once | INTRAMUSCULAR | Status: AC
  Administered 2020-08-26: 10 mg via INTRAVENOUS
  Filled 2020-08-25: qty 1

## 2020-08-25 MED ORDER — METOPROLOL TARTRATE 12.5 MG HALF TABLET
12.5000 mg | ORAL_TABLET | Freq: Two times a day (BID) | ORAL | Status: DC
  Administered 2020-08-25 – 2020-08-26 (×2): 12.5 mg via ORAL
  Filled 2020-08-25 (×2): qty 1

## 2020-08-25 MED ORDER — PROPOFOL 500 MG/50ML IV EMUL
INTRAVENOUS | Status: DC | PRN
  Administered 2020-08-25: 100 ug/kg/min via INTRAVENOUS
  Administered 2020-08-25: 50 ug/kg/min via INTRAVENOUS

## 2020-08-25 MED ORDER — LACTATED RINGERS IV SOLN
INTRAVENOUS | Status: DC
  Administered 2020-08-25: 1000 mL via INTRAVENOUS

## 2020-08-25 MED ORDER — SODIUM CHLORIDE 0.9 % IV SOLN
2.0000 g | INTRAVENOUS | Status: AC
  Administered 2020-08-25: 2 g via INTRAVENOUS
  Filled 2020-08-25: qty 2

## 2020-08-25 MED ORDER — METOCLOPRAMIDE HCL 5 MG PO TABS: 5.0000 mg | ORAL_TABLET | Freq: Three times a day (TID) | ORAL | Status: DC | PRN

## 2020-08-25 MED ORDER — POVIDONE-IODINE 10 % EX SWAB
2.0000 "application " | Freq: Once | CUTANEOUS | Status: AC
  Administered 2020-08-25: 2 via TOPICAL

## 2020-08-25 MED ORDER — CLOPIDOGREL BISULFATE 75 MG PO TABS
75.0000 mg | ORAL_TABLET | Freq: Every day | ORAL | Status: DC
  Administered 2020-08-26: 75 mg via ORAL
  Filled 2020-08-25: qty 1

## 2020-08-25 MED ORDER — INSULIN ASPART 100 UNIT/ML IJ SOLN
0.0000 [IU] | Freq: Three times a day (TID) | INTRAMUSCULAR | Status: DC
  Administered 2020-08-25: 5 [IU] via SUBCUTANEOUS
  Administered 2020-08-26: 2 [IU] via SUBCUTANEOUS

## 2020-08-25 MED ORDER — FLEET ENEMA 7-19 GM/118ML RE ENEM: 1.0000 | ENEMA | Freq: Once | RECTAL | Status: DC | PRN

## 2020-08-25 MED ORDER — MIDAZOLAM HCL 2 MG/2ML IJ SOLN: 0.5000 mg | Freq: Once | INTRAMUSCULAR | Status: DC | PRN

## 2020-08-25 MED ORDER — HYDROMORPHONE HCL 1 MG/ML IJ SOLN: 0.5000 mg | INTRAMUSCULAR | Status: DC | PRN

## 2020-08-25 MED ORDER — DEXAMETHASONE SODIUM PHOSPHATE 10 MG/ML IJ SOLN
8.0000 mg | Freq: Once | INTRAMUSCULAR | Status: AC
  Administered 2020-08-25: 8 mg via INTRAVENOUS

## 2020-08-25 MED ORDER — CEFAZOLIN SODIUM 2 G IJ SOLR
2.0000 g | Freq: Four times a day (QID) | INTRAMUSCULAR | Status: AC
  Administered 2020-08-25 (×2): 2 g via INTRAVENOUS
  Filled 2020-08-25 (×2): qty 2

## 2020-08-25 MED ORDER — AMLODIPINE BESYLATE 5 MG PO TABS
5.0000 mg | ORAL_TABLET | Freq: Two times a day (BID) | ORAL | Status: DC
  Administered 2020-08-26: 5 mg via ORAL
  Filled 2020-08-25: qty 1

## 2020-08-25 MED ORDER — MENTHOL 3 MG MT LOZG: 1.0000 | LOZENGE | OROMUCOSAL | Status: DC | PRN

## 2020-08-25 MED ORDER — ONDANSETRON HCL 4 MG/2ML IJ SOLN
INTRAMUSCULAR | Status: DC | PRN
  Administered 2020-08-25: 4 mg via INTRAVENOUS

## 2020-08-25 MED ORDER — BUPIVACAINE LIPOSOME 1.3 % IJ SUSP
INTRAMUSCULAR | Status: DC | PRN
  Administered 2020-08-25: 80 mL

## 2020-08-25 MED ORDER — ISOSORBIDE MONONITRATE ER 60 MG PO TB24
60.0000 mg | ORAL_TABLET | Freq: Every day | ORAL | Status: DC
  Administered 2020-08-26: 60 mg via ORAL
  Filled 2020-08-25: qty 1

## 2020-08-25 MED ORDER — BISACODYL 10 MG RE SUPP: 10.0000 mg | Freq: Every day | RECTAL | Status: DC | PRN

## 2020-08-25 MED ORDER — PANTOPRAZOLE SODIUM 40 MG PO TBEC
40.0000 mg | DELAYED_RELEASE_TABLET | Freq: Every day | ORAL | Status: DC
  Administered 2020-08-25 – 2020-08-26 (×2): 40 mg via ORAL
  Filled 2020-08-25 (×2): qty 1

## 2020-08-25 MED ORDER — HYDROMORPHONE HCL 2 MG PO TABS
2.0000 mg | ORAL_TABLET | ORAL | Status: DC | PRN
  Administered 2020-08-25 – 2020-08-26 (×4): 4 mg via ORAL
  Filled 2020-08-25: qty 1
  Filled 2020-08-25 (×4): qty 2

## 2020-08-25 MED ORDER — ROPIVACAINE HCL 7.5 MG/ML IJ SOLN
INTRAMUSCULAR | Status: DC | PRN
  Administered 2020-08-25: 20 mL via PERINEURAL

## 2020-08-25 MED ORDER — LIDOCAINE 2% (20 MG/ML) 5 ML SYRINGE
INTRAMUSCULAR | Status: DC | PRN
  Administered 2020-08-25: 20 mg via INTRAVENOUS

## 2020-08-25 MED ORDER — PHENYLEPHRINE HCL (PRESSORS) 10 MG/ML IV SOLN
INTRAVENOUS | Status: AC
  Filled 2020-08-25: qty 1

## 2020-08-25 MED ORDER — SODIUM CHLORIDE 0.9 % IV SOLN: INTRAVENOUS | Status: DC

## 2020-08-25 MED ORDER — PROPOFOL 10 MG/ML IV BOLUS
INTRAVENOUS | Status: DC | PRN
Start: 2020-08-25 — End: 2020-08-25
  Administered 2020-08-25 (×2): 100 mg via INTRAVENOUS
  Administered 2020-08-25: 25 mg via INTRAVENOUS
  Administered 2020-08-25: 15 mg via INTRAVENOUS

## 2020-08-25 MED ORDER — LACTATED RINGERS IV SOLN: INTRAVENOUS | Status: DC

## 2020-08-25 MED ORDER — POLYETHYLENE GLYCOL 3350 17 G PO PACK: 17.0000 g | PACK | Freq: Every day | ORAL | Status: DC | PRN

## 2020-08-25 MED ORDER — ACETAMINOPHEN 10 MG/ML IV SOLN
1000.0000 mg | Freq: Four times a day (QID) | INTRAVENOUS | Status: DC
  Administered 2020-08-25: 1000 mg via INTRAVENOUS
  Filled 2020-08-25: qty 100

## 2020-08-25 MED ORDER — METHOCARBAMOL 500 MG IVPB - SIMPLE MED
INTRAVENOUS | Status: AC
  Filled 2020-08-25: qty 50

## 2020-08-25 MED ORDER — METHOCARBAMOL 500 MG PO TABS: 500.0000 mg | ORAL_TABLET | Freq: Four times a day (QID) | ORAL | Status: DC | PRN

## 2020-08-25 MED ORDER — METOCLOPRAMIDE HCL 5 MG/ML IJ SOLN: 5.0000 mg | Freq: Three times a day (TID) | INTRAMUSCULAR | Status: DC | PRN

## 2020-08-25 MED ORDER — FENTANYL CITRATE (PF) 100 MCG/2ML IJ SOLN
50.0000 ug | INTRAMUSCULAR | Status: DC
  Administered 2020-08-25: 50 ug via INTRAVENOUS
  Filled 2020-08-25: qty 2

## 2020-08-25 MED ORDER — DIPHENHYDRAMINE HCL 12.5 MG/5ML PO ELIX: 12.5000 mg | ORAL_SOLUTION | ORAL | Status: DC | PRN

## 2020-08-25 MED ORDER — ORAL CARE MOUTH RINSE: 15.0000 mL | Freq: Once | OROMUCOSAL | Status: AC

## 2020-08-25 MED ORDER — EPHEDRINE SULFATE-NACL 50-0.9 MG/10ML-% IV SOSY
PREFILLED_SYRINGE | INTRAVENOUS | Status: DC | PRN
  Administered 2020-08-25 (×4): 5 mg via INTRAVENOUS

## 2020-08-25 MED ORDER — DOCUSATE SODIUM 100 MG PO CAPS
100.0000 mg | ORAL_CAPSULE | Freq: Two times a day (BID) | ORAL | Status: DC
  Administered 2020-08-25 – 2020-08-26 (×2): 100 mg via ORAL
  Filled 2020-08-25 (×3): qty 1

## 2020-08-25 MED ORDER — HYDROMORPHONE HCL 1 MG/ML IJ SOLN
INTRAMUSCULAR | Status: AC
  Filled 2020-08-25: qty 2

## 2020-08-25 MED ORDER — LINAGLIPTIN 5 MG PO TABS
5.0000 mg | ORAL_TABLET | Freq: Every day | ORAL | Status: DC
  Administered 2020-08-26: 5 mg via ORAL
  Filled 2020-08-25: qty 1

## 2020-08-25 MED ORDER — MEPERIDINE HCL 50 MG/ML IJ SOLN: 6.2500 mg | INTRAMUSCULAR | Status: DC | PRN

## 2020-08-25 MED ORDER — HYDROMORPHONE HCL 1 MG/ML IJ SOLN
0.2500 mg | INTRAMUSCULAR | Status: DC | PRN
  Administered 2020-08-25 (×2): 0.5 mg via INTRAVENOUS

## 2020-08-25 MED ORDER — INSULIN ASPART 100 UNIT/ML IJ SOLN
0.0000 [IU] | Freq: Every day | INTRAMUSCULAR | Status: DC
  Administered 2020-08-25: 2 [IU] via SUBCUTANEOUS

## 2020-08-25 MED ORDER — LOSARTAN POTASSIUM 50 MG PO TABS: 50.0000 mg | ORAL_TABLET | Freq: Every day | ORAL | Status: DC

## 2020-08-25 MED ORDER — ACETAMINOPHEN 500 MG PO TABS
1000.0000 mg | ORAL_TABLET | Freq: Four times a day (QID) | ORAL | Status: DC
  Administered 2020-08-25 – 2020-08-26 (×3): 1000 mg via ORAL
  Filled 2020-08-25 (×3): qty 2

## 2020-08-25 MED ORDER — CHLORHEXIDINE GLUCONATE 0.12 % MT SOLN
15.0000 mL | Freq: Once | OROMUCOSAL | Status: AC
  Administered 2020-08-25: 15 mL via OROMUCOSAL

## 2020-08-25 MED ORDER — TRANEXAMIC ACID-NACL 1000-0.7 MG/100ML-% IV SOLN
1000.0000 mg | INTRAVENOUS | Status: AC
  Administered 2020-08-25: 1000 mg via INTRAVENOUS
  Filled 2020-08-25: qty 100

## 2020-08-25 MED ORDER — METHOCARBAMOL 500 MG IVPB - SIMPLE MED
500.0000 mg | Freq: Four times a day (QID) | INTRAVENOUS | Status: DC | PRN
  Administered 2020-08-25: 500 mg via INTRAVENOUS
  Filled 2020-08-25: qty 50

## 2020-08-25 MED ORDER — ASPIRIN 81 MG PO CHEW
81.0000 mg | CHEWABLE_TABLET | Freq: Every day | ORAL | Status: DC
  Administered 2020-08-26: 81 mg via ORAL
  Filled 2020-08-25: qty 1

## 2020-08-25 MED ORDER — MIDAZOLAM HCL 2 MG/2ML IJ SOLN
1.0000 mg | INTRAMUSCULAR | Status: DC
  Filled 2020-08-25: qty 2

## 2020-08-25 MED ORDER — TRAMADOL HCL 50 MG PO TABS
50.0000 mg | ORAL_TABLET | Freq: Four times a day (QID) | ORAL | Status: DC | PRN
Start: 2020-08-25 — End: 2020-08-26
  Administered 2020-08-25: 50 mg via ORAL
  Filled 2020-08-25: qty 1

## 2020-08-25 MED ORDER — PROMETHAZINE HCL 25 MG/ML IJ SOLN: 6.2500 mg | INTRAMUSCULAR | Status: DC | PRN

## 2020-08-25 MED ORDER — OXYCODONE HCL 5 MG PO TABS: 5.0000 mg | ORAL_TABLET | Freq: Once | ORAL | Status: DC | PRN

## 2020-08-25 MED ORDER — NITROGLYCERIN 0.4 MG SL SUBL: 0.4000 mg | SUBLINGUAL_TABLET | SUBLINGUAL | Status: DC | PRN

## 2020-08-25 MED ORDER — ONDANSETRON HCL 4 MG/2ML IJ SOLN: 4.0000 mg | Freq: Four times a day (QID) | INTRAMUSCULAR | Status: DC | PRN

## 2020-08-25 MED ORDER — OXYCODONE HCL 5 MG/5ML PO SOLN: 5.0000 mg | Freq: Once | ORAL | Status: DC | PRN

## 2020-08-25 MED ORDER — PHENYLEPHRINE HCL-NACL 10-0.9 MG/250ML-% IV SOLN
INTRAVENOUS | Status: DC | PRN
  Administered 2020-08-25: 40 ug/min via INTRAVENOUS

## 2020-08-25 MED ORDER — PHENOL 1.4 % MT LIQD: 1.0000 | OROMUCOSAL | Status: DC | PRN

## 2020-08-25 MED ORDER — SODIUM CHLORIDE 0.9 % IR SOLN
Status: DC | PRN
  Administered 2020-08-25: 1000 mL

## 2020-08-25 MED ORDER — INSULIN ASPART PROT & ASPART (70-30 MIX) 100 UNIT/ML ~~LOC~~ SUSP
10.0000 [IU] | Freq: Two times a day (BID) | SUBCUTANEOUS | Status: DC
  Administered 2020-08-26: 10 [IU] via SUBCUTANEOUS
  Filled 2020-08-25: qty 10

## 2020-08-25 SURGICAL SUPPLY — 57 items
BAG COUNTER SPONGE SURGICOUNT (BAG) IMPLANT
BAG ZIPLOCK 12X15 (MISCELLANEOUS) IMPLANT
BLADE SAG 18X100X1.27 (BLADE) ×2 IMPLANT
BLADE SAW SGTL 11.0X1.19X90.0M (BLADE) ×2 IMPLANT
BNDG ELASTIC 6X5.8 VLCR STR LF (GAUZE/BANDAGES/DRESSINGS) ×2 IMPLANT
BONE CEMENT GENTAMICIN (Cement) ×4 IMPLANT
BOWL SMART MIX CTS (DISPOSABLE) ×2 IMPLANT
CEMENT BONE GENTAMICIN 40 (Cement) ×2 IMPLANT
CEMENT GMV SMARTSET GENT 40G (Cement) IMPLANT
CEMENT TIBIA MBT SIZE 5 (Knees) ×1 IMPLANT
COVER SURGICAL LIGHT HANDLE (MISCELLANEOUS) ×2 IMPLANT
CUFF TOURN SGL QUICK 34 (TOURNIQUET CUFF) ×1
CUFF TRNQT CYL 34X4.125X (TOURNIQUET CUFF) ×1 IMPLANT
DRAPE POUCH INSTRU U-SHP 10X18 (DRAPES) ×2 IMPLANT
DRAPE U-SHAPE 47X51 STRL (DRAPES) ×2 IMPLANT
DRSG ADAPTIC 3X8 NADH LF (GAUZE/BANDAGES/DRESSINGS) ×2 IMPLANT
DRSG PAD ABDOMINAL 8X10 ST (GAUZE/BANDAGES/DRESSINGS) ×2 IMPLANT
DURAPREP 26ML APPLICATOR (WOUND CARE) ×2 IMPLANT
ELECT REM PT RETURN 15FT ADLT (MISCELLANEOUS) ×2 IMPLANT
EVACUATOR 1/8 PVC DRAIN (DRAIN) ×2 IMPLANT
FEMUR SIGMA PS SZ 5.0 R (Femur) ×2 IMPLANT
GAUZE SPONGE 4X4 12PLY STRL (GAUZE/BANDAGES/DRESSINGS) ×2 IMPLANT
GLOVE SRG 8 PF TXTR STRL LF DI (GLOVE) ×1 IMPLANT
GLOVE SURG ENC MOIS LTX SZ6.5 (GLOVE) ×4 IMPLANT
GLOVE SURG ENC MOIS LTX SZ8 (GLOVE) ×4 IMPLANT
GLOVE SURG POLYISO LF SZ7 (GLOVE) ×2 IMPLANT
GLOVE SURG UNDER POLY LF SZ7 (GLOVE) ×4 IMPLANT
GLOVE SURG UNDER POLY LF SZ8 (GLOVE) ×1
GOWN STRL REUS W/TWL LRG LVL3 (GOWN DISPOSABLE) ×8 IMPLANT
HANDPIECE INTERPULSE COAX TIP (DISPOSABLE) ×1
IMMOBILIZER KNEE 20 (SOFTGOODS) ×2
IMMOBILIZER KNEE 20 THIGH 36 (SOFTGOODS) ×1 IMPLANT
KIT TURNOVER KIT A (KITS) ×2 IMPLANT
MANIFOLD NEPTUNE II (INSTRUMENTS) ×2 IMPLANT
NS IRRIG 1000ML POUR BTL (IV SOLUTION) ×2 IMPLANT
PACK TOTAL KNEE CUSTOM (KITS) ×2 IMPLANT
PADDING CAST COTTON 6X4 STRL (CAST SUPPLIES) ×6 IMPLANT
PATELLA DOME PFC 41MM (Knees) ×2 IMPLANT
PENCIL SMOKE EVACUATOR (MISCELLANEOUS) IMPLANT
PIN STEINMAN FIXATION KNEE (PIN) ×2 IMPLANT
PLATE ROT INSERT 15MM SIZE 5 (Plate) ×2 IMPLANT
PROTECTOR NERVE ULNAR (MISCELLANEOUS) ×2 IMPLANT
SET HNDPC FAN SPRY TIP SCT (DISPOSABLE) ×1 IMPLANT
STRIP CLOSURE SKIN 1/2X4 (GAUZE/BANDAGES/DRESSINGS) ×4 IMPLANT
SUCTION FRAZIER HANDLE 12FR (TUBING) ×1
SUCTION TUBE FRAZIER 12FR DISP (TUBING) ×1 IMPLANT
SUT MNCRL AB 4-0 PS2 18 (SUTURE) ×2 IMPLANT
SUT PDS AB 1 CT1 27 (SUTURE) IMPLANT
SUT STRATAFIX 0 PDS 27 VIOLET (SUTURE) ×2
SUT VIC AB 2-0 CT1 27 (SUTURE) ×6
SUT VIC AB 2-0 CT1 TAPERPNT 27 (SUTURE) ×3 IMPLANT
SUTURE STRATFX 0 PDS 27 VIOLET (SUTURE) ×1 IMPLANT
TIBIA MBT CEMENT SIZE 5 (Knees) ×2 IMPLANT
TOWEL OR 17X26 10 PK STRL BLUE (TOWEL DISPOSABLE) ×2 IMPLANT
TRAY FOLEY MTR SLVR 16FR STAT (SET/KITS/TRAYS/PACK) ×2 IMPLANT
WATER STERILE IRR 1000ML POUR (IV SOLUTION) ×2 IMPLANT
WRAP KNEE MAXI GEL POST OP (GAUZE/BANDAGES/DRESSINGS) ×4 IMPLANT

## 2020-08-25 NOTE — Transfer of Care (Signed)
Immediate Anesthesia Transfer of Care Note  Patient: Matthew [name]  Procedure(s) Performed: Revision right knee unicompartmental arthroplasty to total knee arthroplasty (Right: Knee)  Patient Location: PACU  Anesthesia Type:General  Level of Consciousness: drowsy and patient cooperative  Airway & Oxygen Therapy: Patient Spontanous Breathing and Patient connected to face mask  Post-op Assessment: Report given to RN and Post -op Vital signs reviewed and stable  Post vital signs: Reviewed and stable  Last Vitals:  Vitals Value Taken Time  BP    Temp    Pulse 65 08/25/20 1157  Resp 12 08/25/20 1157  SpO2 100 % 08/25/20 1157  Vitals shown include unvalidated device data.  Last Pain:  Vitals:   08/25/20 0821  PainSc: 0-No pain         Complications: No notable events documented.

## 2020-08-25 NOTE — Interval H&P Note (Signed)
History and Physical Interval Note:  08/25/2020 8:33 AM  Matthew [name]  has presented today for surgery, with the diagnosis of Failed right knee unicompartmental replacement.  The various methods of treatment have been discussed with the patient and family. After consideration of risks, benefits and other options for treatment, the patient has consented to  Procedure(s): Revision right knee unicompartmental arthroplasty to total knee arthroplasty (Right) as a surgical intervention.  The patient's history has been reviewed, patient examined, no change in status, stable for surgery.  I have reviewed the patient's chart and labs.  Questions were answered to the patient's satisfaction.     Pilar Plate Lilyrose Tanney

## 2020-08-25 NOTE — Addendum Note (Signed)
Addendum  created 08/25/20 1500 by Milford Cage, CRNA   Charge Capture section accepted

## 2020-08-25 NOTE — Progress Notes (Signed)
AssistedDr. Carswell Jackson with right, ultrasound guided, adductor canal block. Side rails up, monitors on throughout procedure. See vital signs in flow sheet. Tolerated Procedure well.  

## 2020-08-25 NOTE — Op Note (Signed)
NAME: Matthew [name], Matthew [name] MEDICAL RECORD NO: [id number] ACCOUNT NO: [id number] DATE OF BIRTH: 1940-03-02 FAC[facility]: [facility] PHYSICIAN: Dione Plover. Zhane Donlan, MD  Operative Report   DATE OF PROCEDURE: 08/25/2020  PREOPERATIVE DIAGNOSIS:  Failed right knee unicompartmental replacement.  POSTOPERATIVE DIAGNOSIS:  Failed right knee unicompartmental replacement.  PROCEDURE:  Revision of right knee unicompartmental replacement to total knee arthroplasty.  SURGEON:  Dione Plover. Sundra Haddix, MD  ASSISTANT:  Fenton Foy, PA-C  ANESTHESIA:  General and adductor canal block.  ESTIMATED BLOOD LOSS:  Minimal.  DRAIN: None.  TOURNIQUET TIME:  49 minutes at 300 mmHg.  COMPLICATIONS:  None.  CONDITION:  Stable to recovery.  BRIEF CLINICAL NOTE:  The patient is a 80 year old male, had a medial unicompartmental right knee performed over 10 years ago.  He has had progressively worsening pain and dysfunction.  He has gone on to develop significant bone on bone changes in the  lateral and patellofemoral compartments as well as probable loosening of[TIME: 01:00] medial components.  He presents now for revision of a unicompartmental replacement to a total knee arthroplasty.  DESCRIPTION OF PROCEDURE:  After successful administration of adductor canal block, then general anesthetic, a tourniquet was placed high on his right thigh, and his right lower extremity was prepped and draped in the usual sterile fashion.  Extremity  was wrapped in Esmarch, knee flexed, tourniquet inflated to 300 mmHg.  Midline incision was made with a 10 blade through the subcutaneous tissue to the level of the extensor mechanism.  A fresh blade was used to make a medial parapatellar arthrotomy.   Soft tissue on the proximal medial tibia subperiosteally elevated to the joint line with a knife and into the semimembranosus bursa with a Cobb elevator.  Soft tissue laterally was elevated with attention being paid to avoiding  patellar tendon on the  tibial tubercle.  The patella was everted, knee flexed 90 degrees, and ACL and PCL were removed.  The femoral component was then removed from the medial femur using osteotomes to disrupt the interface between the component and bone.  This was the medial  unicompartmental femoral replacement and that was removed.  A drill was used to create a starting hole in the distal femur and canal was thoroughly irrigated.  A 5-degree right valgus alignment guide was placed, and the block pins removed 10 mm off the  distal femur.  Distal femoral resection was made with an oscillating saw.  Tibia subluxed forward and lateral meniscus removed.  Extramedullary tibial alignment guide was placed referencing proximally at the medial aspect of the tibial tubercle and distally on the second metatarsal axis and tibial crest.  Block was pinned to go  2 mm under where the medial compartment replacement was.  Resection was made with an oscillating saw.  It was down to healthy bone.  Size 5 was a most appropriate tibial component and proximal tibia was then prepared to modular drill and keel punch for  the size 5.  Femoral preparation was completed with the intercondylar cut.  Trials were placed.  On the tibial side, it was a size 5, on the femoral side size 5 posterior stabilized with a 15 mm posterior stabilized rotating platform insert trial.  Full extension was achieved with excellent varus, valgus, and anterior and  posterior balance.  The patella was then everted and thickness measured to be 27 mm.  Freehand resection was taken to 15 mm, 41 template was placed, lug holes were drilled, trial patella was placed and  tracked normally.  Osteophytes were then removed  From the posterior femur with the[TIME: 03:39] femur trial in place.  Trials were removed.  Surfaces were irrigated with pulsatile lavage.  The cement was mixed, which was two batches of gentamicin-impregnated cement, and once ready for  implantation, the size 5[TIME: 03:57] tibial  tray, a size 5 posterior stabilized Sigma femur, and 41 patella were cemented.  Patella was held with a clamp.  Trial 15 mm insert was placed and the extruded cement[TIME: 04:09] removed.  Once the cement had hardened, then the 15 mm posterior stabilized rotating  platform insert was placed in the tibial tray.  Knee was reduced with excellent stability throughout full range of motion.  20 mL of Exparel mixed with 60 mL of saline then injected into the extensor mechanism,the periosteum of the femur[TIME: 04:35] and  Subcutaneous tissues.  Irrigated with saline[TIME: 04:39] solution and the arthrotomy closed with no drain.  It was closed with running 0 Stratafix suture.  Flexion against gravity was 135 degrees.  Tourniquet was then released, total time 49 minutes.  Subcutaneous  was then closed with interrupted 2-0 Vicryl and subcuticular running 4-0 Monocryl.  Incisions cleaned and dried.  Steri-Strips and sterile dressing applied.  He was awakened and transported to recovery in stable condition.  Note that a surgical assistant was of medical necessity for this procedure.  Surgical assistant was necessary for retraction of vital ligaments, neurovascular structures, and for proper positioning of the limb to remove the old implant and for safe and  accurate placement of the new implant.   [facility] D: 08/25/2020 11:30:07 am T: 08/25/2020 2:53:00 pm  JOB: [id number]

## 2020-08-25 NOTE — Discharge Instructions (Signed)
Matthew Arabian, MD Total Joint Specialist [facility] [address] [place], [place] [address] [phone]  TOTAL KNEE REPLACEMENT POSTOPERATIVE DIRECTIONS  Knee Rehabilitation, Guidelines Following Surgery  Results after knee surgery are often greatly improved when you follow the exercise, range of motion and muscle strengthening exercises prescribed by your doctor. Safety measures are also important to protect the knee from further injury. If any of these exercises cause you to have increased pain or swelling in your knee joint, decrease the amount until you are comfortable again and slowly increase them. If you have problems or questions, call your caregiver or physical therapist for advice.   BLOOD CLOT PREVENTION Take an 81 mg Aspirin once a day for three weeks following surgery with your usual Plavix dose. Then discontinue Aspirin. You may resume your vitamins/supplements upon discharge from the hospital.   Bennett items at home which could result in a fall. This includes throw rugs or furniture in walking pathways.  ICE to the affected knee as much as tolerated. Icing helps control swelling. If the swelling is well controlled you will be more comfortable and rehab easier. Continue to use ice on the knee for pain and swelling from surgery. You may notice swelling that will progress down to the foot and ankle. This is normal after surgery. Elevate the leg when you are not up walking on it.    Continue to use the breathing machine which will help keep your temperature down. It is common for your temperature to cycle up and down following surgery, especially at night when you are not up moving around and exerting yourself. The breathing machine keeps your lungs expanded and your temperature down. Do not place pillow under the operative knee, focus on keeping the knee straight while resting  DIET You may resume your previous home diet  once you are discharged from the hospital.  DRESSING / WOUND CARE / SHOWERING Keep your bulky bandage on for 2 days. On the third post-operative day you may remove the Ace bandage and gauze. There is a waterproof adhesive bandage on your skin which will stay in place until your first follow-up appointment. Once you remove this you will not need to place another bandage You may begin showering 3 days following surgery, but do not submerge the incision under water.  ACTIVITY For the first 5 days, the key is rest and control of pain and swelling Do your home exercises twice a day starting on post-operative day 3. On the days you go to physical therapy, just do the home exercises once that day. You should rest, ice and elevate the leg for 50 minutes out of every hour. Get up and walk/stretch for 10 minutes per hour. After 5 days you can increase your activity slowly as tolerated. Walk with your walker as instructed. Use the walker until you are comfortable transitioning to a cane. Walk with the cane in the opposite hand of the operative leg. You may discontinue the cane once you are comfortable and walking steadily. Avoid periods of inactivity such as sitting longer than an hour when not asleep. This helps prevent blood clots.  You may discontinue the knee immobilizer once you are able to perform a straight leg raise while lying down. You may resume a sexual relationship in one month or when given the OK by your doctor.  You may return to work once you are cleared by your doctor.  Do not drive a car  for 6 weeks or until released by your surgeon.  Do not drive while taking narcotics.  TED HOSE STOCKINGS Wear the elastic stockings on both legs for three weeks following surgery during the day. You may remove them at night for sleeping.  WEIGHT BEARING Weight bearing as tolerated with assist device (walker, cane, etc) as directed, use it as long as suggested by your surgeon or therapist, typically at  least 4-6 weeks.  POSTOPERATIVE CONSTIPATION PROTOCOL Constipation - defined medically as fewer than three stools per week and severe constipation as less than one stool per week.  One of the most common issues patients have following surgery is constipation.  Even if you have a regular bowel pattern at home, your normal regimen is likely to be disrupted due to multiple reasons following surgery.  Combination of anesthesia, postoperative narcotics, change in appetite and fluid intake all can affect your bowels.  In order to avoid complications following surgery, here are some recommendations in order to help you during your recovery period.  Colace (docusate) - Pick up an over-the-counter form of Colace or another stool softener and take twice a day as long as you are requiring postoperative pain medications.  Take with a full glass of water daily.  If you experience loose stools or diarrhea, hold the colace until you stool forms back up. If your symptoms do not get better within 1 week or if they get worse, check with your doctor. Dulcolax (bisacodyl) - Pick up over-the-counter and take as directed by the product packaging as needed to assist with the movement of your bowels.  Take with a full glass of water.  Use this product as needed if not relieved by Colace only.  MiraLax (polyethylene glycol) - Pick up over-the-counter to have on hand. MiraLax is a solution that will increase the amount of water in your bowels to assist with bowel movements.  Take as directed and can mix with a glass of water, juice, soda, coffee, or tea. Take if you go more than two days without a movement. Do not use MiraLax more than once per day. Call your doctor if you are still constipated or irregular after using this medication for 7 days in a row.  If you continue to have problems with postoperative constipation, please contact the office for further assistance and recommendations.  If you experience "the worst abdominal  pain ever" or develop nausea or vomiting, please contact the office immediatly for further recommendations for treatment.  ITCHING If you experience itching with your medications, try taking only a single pain pill, or even half a pain pill at a time.  You can also use Benadryl over the counter for itching or also to help with sleep.   MEDICATIONS See your medication summary on the "After Visit Summary" that the nursing staff will review with you prior to discharge.  You may have some home medications which will be placed on hold until you complete the course of blood thinner medication.  It is important for you to complete the blood thinner medication as prescribed by your surgeon.  Continue your approved medications as instructed at time of discharge.  PRECAUTIONS If you experience chest pain or shortness of breath - call 911 immediately for transfer to the hospital emergency department.  If you develop a fever greater that 101 F, purulent drainage from wound, increased redness or drainage from wound, foul odor from the wound/dressing, or calf pain - CONTACT YOUR SURGEON.  FOLLOW-UP APPOINTMENTS Make sure you keep all of your appointments after your operation with your surgeon and caregivers. You should call the office at the above phone number and make an appointment for approximately two weeks after the date of your surgery or on the date instructed by your surgeon outlined in the "After Visit Summary".  RANGE OF MOTION AND STRENGTHENING EXERCISES  Rehabilitation of the knee is important following a knee injury or an operation. After just a few days of immobilization, the muscles of the thigh which control the knee become weakened and shrink (atrophy). Knee exercises are designed to build up the tone and strength of the thigh muscles and to improve knee motion. Often times heat used for twenty to thirty minutes before working out will loosen up  your tissues and help with improving the range of motion but do not use heat for the first two weeks following surgery. These exercises can be done on a training (exercise) mat, on the floor, on a table or on a bed. Use what ever works the best and is most comfortable for you Knee exercises include:  Leg Lifts - While your knee is still immobilized in a splint or cast, you can do straight leg raises. Lift the leg to 60 degrees, hold for 3 sec, and slowly lower the leg. Repeat 10-20 times 2-3 times daily. Perform this exercise against resistance later as your knee gets better.  Quad and Hamstring Sets - Tighten up the muscle on the front of the thigh (Quad) and hold for 5-10 sec. Repeat this 10-20 times hourly. Hamstring sets are done by pushing the foot backward against an object and holding for 5-10 sec. Repeat as with quad sets.  Leg Slides: Lying on your back, slowly slide your foot toward your buttocks, bending your knee up off the floor (only go as far as is comfortable). Then slowly slide your foot back down until your leg is flat on the floor again. Angel Wings: Lying on your back spread your legs to the side as far apart as you can without causing discomfort.  A rehabilitation program following serious knee injuries can speed recovery and prevent re-injury in the future due to weakened muscles. Contact your doctor or a physical therapist for more information on knee rehabilitation.   POST-OPERATIVE OPIOID TAPER INSTRUCTIONS: It is important to wean off of your opioid medication as soon as possible. If you do not need pain medication after your surgery it is ok to stop day one. Opioids include: Codeine, Hydrocodone(Norco, Vicodin), Oxycodone(Percocet, oxycontin) and hydromorphone amongst others.  Long term and even short term use of opiods can cause: Increased pain response Dependence Constipation Depression Respiratory depression And more.  Withdrawal symptoms can include Flu like  symptoms Nausea, vomiting And more Techniques to manage these symptoms Hydrate well Eat regular healthy meals Stay active Use relaxation techniques(deep breathing, meditating, yoga) Do Not substitute Alcohol to help with tapering If you have been on opioids for less than two weeks and do not have pain than it is ok to stop all together.  Plan to wean off of opioids This plan should start within one week post op of your joint replacement. Maintain the same interval or time between taking each dose and first decrease the dose.  Cut the total daily intake of opioids by one tablet each day Next start to increase the time between doses. The last dose that should be eliminated is the evening dose.   IF YOU ARE TRANSFERRED TO  A SKILLED REHAB FACILITY If the patient is transferred to a skilled rehab facility following release from the hospital, a list of the current medications will be sent to the facility for the patient to continue.  When discharged from the skilled rehab facility, please have the facility set up the patient's [place] prior to being released. Also, the skilled facility will be responsible for providing the patient with their medications at time of release from the facility to include their pain medication, the muscle relaxants, and their blood thinner medication. If the patient is still at the rehab facility at time of the two week follow up appointment, the skilled rehab facility will also need to assist the patient in arranging follow up appointment in our office and any transportation needs.  MAKE SURE YOU:  Understand these instructions.  Get help right away if you are not doing well or get worse.   DENTAL ANTIBIOTICS:  In most cases prophylactic antibiotics for Dental procdeures after total joint surgery are not necessary.  Exceptions are as follows:  1. History of prior total joint infection  2. Severely immunocompromised (Organ Transplant, cancer  chemotherapy, Rheumatoid biologic meds such as [place])  3. Poorly controlled diabetes (A1C &gt; 8.0, blood glucose over 200)  If you have one of these conditions, contact your surgeon for an antibiotic prescription, prior to your dental procedure.    Pick up stool softner and laxative for home use following surgery while on pain medications. Do not submerge incision under water. Please use good hand washing techniques while changing dressing each day. May shower starting three days after surgery. Please use a clean towel to pat the incision dry following showers. Continue to use ice for pain and swelling after surgery. Do not use any lotions or creams on the incision until instructed by your surgeon.

## 2020-08-25 NOTE — Brief Op Note (Signed)
08/25/2020  11:21 AM  PATIENT:  Matthew [name]  80 y.o. male  PRE-OPERATIVE DIAGNOSIS:  Failed right knee unicompartmental replacement  POST-OPERATIVE DIAGNOSIS:   Failed right knee unicompartmental replacement  PROCEDURE:  Procedure(s): Revision right knee unicompartmental arthroplasty to total knee arthroplasty (Right)  SURGEON:  Surgeon(s) and Role:    Gaynelle Arabian, MD - Primary  PHYSICIAN ASSISTANT:   ASSISTANTS: Fenton Foy, PA-C   ANESTHESIA:   general and adductor canal block  EBL:  25 ml   BLOOD ADMINISTERED:none  DRAINS: none   LOCAL MEDICATIONS USED:  OTHER Exparel  COUNTS:  YES  TOURNIQUET:  49 minutes @ 300 mm Hg  DICTATION: .Other Dictation: Dictation Number [id number]  PLAN OF CARE: Admit for overnight observation  PATIENT DISPOSITION:  PACU - hemodynamically stable.

## 2020-08-25 NOTE — Anesthesia Procedure Notes (Signed)
Anesthesia Regional Block: Adductor canal block   Pre-Anesthetic Checklist: , timeout performed,  Correct Patient, Correct Site, Correct Laterality,  Correct Procedure, Correct Position, site marked,  Risks and benefits discussed,  Surgical consent,  Pre-op evaluation,  At surgeon's request and post-op pain management  Laterality: Right and Lower  Prep: chloraprep       Needles:  Injection technique: Single-shot  Needle Type: Echogenic Needle     Needle Length: 9cm  Needle Gauge: 21     Additional Needles:   Procedures:,,,, ultrasound used (permanent image in chart),,    Narrative:  Start time: 08/25/2020 9:04 AM End time: 08/25/2020 9:10 AM Injection made incrementally with aspirations every 5 mL.  Performed by: Personally  Anesthesiologist: Annye Asa, MD  Additional Notes: Pt identified in Holding room.  Monitors applied. Working IV access confirmed. Sterile prep R thigh.  #21ga ECHOgenic Arrow block needle into adductor canal with US guidance.  20cc 0.75% Ropivacaine injected incrementally after negative test dose.  Patient asymptomatic, VSS, no heme aspirated, tolerated well.   Jenita Seashore, MD

## 2020-08-25 NOTE — Anesthesia Procedure Notes (Signed)
Procedure Name: LMA Insertion Date/Time: 08/25/2020 10:05 AM Performed by: Milford Cage, CRNA Pre-anesthesia Checklist: Patient identified, Emergency Drugs available, Suction available and Patient being monitored Patient Re-evaluated:Patient Re-evaluated prior to induction Oxygen Delivery Method: Circle System Utilized Preoxygenation: Pre-oxygenation with 100% oxygen Induction Type: IV induction Ventilation: Mask ventilation without difficulty LMA: LMA inserted LMA Size: 4.0 Number of attempts: 1 Airway Equipment and Method: Bite block Placement Confirmation: positive ETCO2 Tube secured with: Tape Dental Injury: Teeth and Oropharynx as per pre-operative assessment

## 2020-08-25 NOTE — Progress Notes (Signed)
Orthopedic Tech Progress Note Patient Details:  Matthew [name] 1941/01/19 [id number]  Patient ID: Matthew [name], male   DOB: [birth year], 80 y.o.   MRN: [id number]  [name] 08/25/2020, 12:37 PM Cpm applied in pacu

## 2020-08-25 NOTE — Anesthesia Procedure Notes (Signed)
Spinal  Patient location during procedure: OR Start time: 08/25/2020 9:43 AM End time: 08/25/2020 10:00 AM Reason for block: surgical anesthesia Staffing Performed: anesthesiologist  Anesthesiologist: Annye Asa, MD Resident/CRNA: Milford Cage, CRNA Preanesthetic Checklist Completed: patient identified, IV checked, site marked, risks and benefits discussed, surgical consent, monitors and equipment checked, pre-op evaluation and timeout performed Spinal Block Patient position: sitting Prep: DuraPrep and site prepped and draped Patient monitoring: blood pressure, continuous pulse ox, cardiac monitor and heart rate Approach: midline Needle Needle type: Pencan and Introducer  Needle gauge: 24 G Assessment Events: second provider Additional Notes Pt identified in Operating room.  Monitors applied. Working IV access confirmed. Sterile prep, drape lumbar spine.  1% lido local L 3,4, L 2,3   #24ga Pencan, all attempts os by CRNA, then MD will proceed to [facility]

## 2020-08-25 NOTE — Anesthesia Postprocedure Evaluation (Signed)
Anesthesia Post Note  Patient: Matthew [name]  Procedure(s) Performed: Revision right knee unicompartmental arthroplasty to total knee arthroplasty (Right: Knee)     Patient location during evaluation: PACU Anesthesia Type: General Level of consciousness: patient cooperative, oriented and sedated Pain management: pain level controlled Vital Signs Assessment: post-procedure vital signs reviewed and stable Respiratory status: spontaneous breathing, nonlabored ventilation, respiratory function stable and patient connected to nasal cannula oxygen Cardiovascular status: blood pressure returned to baseline and stable Postop Assessment: no apparent nausea or vomiting Anesthetic complications: no   No notable events documented.  Last Vitals:  Vitals:   08/25/20 1245 08/25/20 1300  BP: (!) 160/82 (!) 157/81  Pulse: (!) 59 61  Resp: 11 14  Temp:    SpO2: 99% 99%    Last Pain:  Vitals:   08/25/20 1300  PainSc: Asleep                 Charese Abundis,E. Lousie Calico

## 2020-08-25 NOTE — Evaluation (Signed)
Physical Therapy Evaluation Patient Details Name: Matthew [name] MRN: [id number] DOB: February 18, 1940 Today's Date: 08/25/2020   History of Present Illness  Pt is 80 yo male admitted on 08/25/20 with failed R knee unicompartmental replacement and is now s/p revision of R knee unicompartmental replacement to TKA.  Pt with hx of arthritis, back pain, HLD, HTN, MI, DM, L TSA.  Clinical Impression  Pt is s/p TKA resulting in the deficits listed below (see PT Problem List). At baseline pt is independent and resides with wife.  He is motivated, has good support, and excellent rehab potential.  At evaluation, pt ambulated 150' with RW and with good pain control.  Did have weak quad contraction and difficulty with terminal extension. Educated on resting with leg straight and importance of achieving full knee extension. Expected to progress well.  Pt will benefit from skilled PT to increase their independence and safety with mobility to allow discharge to the venue listed below.      Follow Up Recommendations Follow surgeon's recommendation for DC plan and follow-up therapies;Supervision for mobility/OOB    Equipment Recommendations  3in1 (PT)    Recommendations for Other Services       Precautions / Restrictions Precautions Precautions: Fall Restrictions Weight Bearing Restrictions: Yes RLE Weight Bearing: Weight bearing as tolerated      Mobility  Bed Mobility Overal bed mobility: Needs Assistance Bed Mobility: Supine to Sit     Supine to sit: Min assist     General bed mobility comments: Min A for R LE; educated on use of gait belt for AAROM    Transfers Overall transfer level: Needs assistance Equipment used: Rolling walker (2 wheeled) Transfers: Sit to/from Stand Sit to Stand: Min guard         General transfer comment: cues for hand placement and R LE management  Ambulation/Gait Ambulation/Gait assistance: Min guard Gait Distance (Feet): 150 Feet Assistive device:  Rolling walker (2 wheeled) Gait Pattern/deviations: Step-to pattern;Decreased stride length;Decreased stance time - right;Decreased weight shift to right Gait velocity: decreased   General Gait Details: Educated on sequencing and RW use  Stairs            Wheelchair Mobility    Modified Rankin (Stroke Patients Only)       Balance Overall balance assessment: Needs assistance Sitting-balance support: No upper extremity supported Sitting balance-Leahy Scale: Good     Standing balance support: Bilateral upper extremity supported;No upper extremity supported Standing balance-Leahy Scale: Fair Standing balance comment: RW to ambulate; static stand without AD                             Pertinent Vitals/Pain Pain Assessment: 0-10 Pain Score: 4  Pain Location: R knee Pain Descriptors / Indicators: Discomfort Pain Intervention(s): Limited activity within patient's tolerance;Monitored during session;Premedicated before session;Ice applied    Home Living Family/patient expects to be discharged to:: Private residence Living Arrangements: Spouse/significant other Available Help at Discharge: Family;Available 24 hours/day Type of Home: House Home Access: Stairs to enter Entrance Stairs-Rails: None Entrance Stairs-Number of Steps: 2 Home Layout: One level Home Equipment: Walker - 2 wheels      Prior Function Level of Independence: Independent               Hand Dominance        Extremity/Trunk Assessment   Upper Extremity Assessment Upper Extremity Assessment: Overall WFL for tasks assessed (some limitations in shoulders but functional)  Lower Extremity Assessment Lower Extremity Assessment: LLE deficits/detail;RLE deficits/detail RLE Deficits / Details: Expected post op changes.  ROM: knee 10 to 80 degrees, hip and ankle WFL; MMT: ankle 5/5, knee 2/5, hip 2/5 LLE Deficits / Details: WFL    Cervical / Trunk Assessment Cervical / Trunk  Assessment: Normal  Communication   Communication: No difficulties  Cognition Arousal/Alertness: Awake/alert Behavior During Therapy: WFL for tasks assessed/performed Overall Cognitive Status: Within Functional Limits for tasks assessed                                        General Comments  Educated on safe ice use, no pivots, resting with leg straight. Also, encouraged walking every 1-2 hours during day. Educated on HEP with focus on mobility the first weeks. Discussed doing exercises within pain control and if pain increasing could decreased ROM, reps, and stop exercises as needed. Encouraged to perform quad sets and ankle pumps frequently for blood flow and to promote full knee extension.     Exercises Total Joint Exercises Ankle Circles/Pumps: AROM;10 reps;Supine;Both Quad Sets: AROM;10 reps;Both;Supine   Assessment/Plan    PT Assessment Patient needs continued PT services  PT Problem List Decreased strength;Decreased mobility;Decreased range of motion;Decreased activity tolerance;Decreased cognition;Decreased balance;Decreased knowledge of use of DME;Pain;Decreased safety awareness       PT Treatment Interventions DME instruction;Therapeutic activities;Modalities;Gait training;Therapeutic exercise;Patient/family education;Stair training;Functional mobility training;Balance training    PT Goals (Current goals can be found in the Care Plan section)  Acute Rehab PT Goals Patient Stated Goal: return home PT Goal Formulation: With patient/family Time For Goal Achievement: 09/08/20 Potential to Achieve Goals: Good    Frequency 7X/week   Barriers to discharge        Co-evaluation               AM-PAC PT "6 Clicks" Mobility  Outcome Measure Help needed turning from your back to your side while in a flat bed without using bedrails?: A Little Help needed moving from lying on your back to sitting on the side of a flat bed without using bedrails?: A  Little Help needed moving to and from a bed to a chair (including a wheelchair)?: A Little Help needed standing up from a chair using your arms (e.g., wheelchair or bedside chair)?: A Little Help needed to walk in hospital room?: A Little Help needed climbing 3-5 steps with a railing? : A Little 6 Click Score: 18    End of Session Equipment Utilized During Treatment: Gait belt Activity Tolerance: Patient tolerated treatment well Patient left: with chair alarm set;with call bell/phone within reach;with family/visitor present Nurse Communication: Mobility status PT Visit Diagnosis: Unsteadiness on feet (R26.81);Muscle weakness (generalized) (M62.81)    Time: 1517-6160 PT Time Calculation (min) (ACUTE ONLY): 26 min   Charges:   PT Evaluation $PT Eval Low Complexity: 1 Low PT Treatments $Gait Training: 8-22 mins        Abran Richard, PT Acute Rehab Services Pager [phone] [facility] [place] 08/25/2020, 4:24 PM

## 2020-08-26 ENCOUNTER — Other Ambulatory Visit (HOSPITAL_COMMUNITY): Payer: Self-pay

## 2020-08-26 ENCOUNTER — Encounter (HOSPITAL_COMMUNITY): Payer: Self-pay | Admitting: Orthopedic Surgery

## 2020-08-26 LAB — BASIC METABOLIC PANEL
Anion gap: 9 (ref 5–15)
BUN: 16 mg/dL (ref 8–23)
CO2: 25 mmol/L (ref 22–32)
Calcium: 8.4 mg/dL — ABNORMAL LOW (ref 8.9–10.3)
Chloride: 101 mmol/L (ref 98–111)
Creatinine, Ser: 1.09 mg/dL (ref 0.61–1.24)
GFR, Estimated: >60 mL/min (ref >60)
Glucose, Bld: 167 mg/dL — ABNORMAL HIGH (ref 70–99)
Potassium: 4.9 mmol/L (ref 3.5–5.1)
Sodium: 135 mmol/L (ref 135–145)

## 2020-08-26 LAB — CBC
HCT: 35 % — ABNORMAL LOW (ref 39.0–52.0)
Hemoglobin: 11.3 g/dL — ABNORMAL LOW (ref 13.0–17.0)
MCH: 30.4 pg (ref 26.0–34.0)
MCHC: 32.3 g/dL (ref 30.0–36.0)
MCV: 94.1 fL (ref 80.0–100.0)
Platelets: 171 10*3/uL (ref 150–400)
RBC: 3.72 MIL/uL — ABNORMAL LOW (ref 4.22–5.81)
RDW: 12.7 % (ref 11.5–15.5)
WBC: 8.7 10*3/uL (ref 4.0–10.5)
nRBC: 0 % (ref 0.0–0.2)

## 2020-08-26 LAB — GLUCOSE, CAPILLARY: Glucose-Capillary: 142 mg/dL — ABNORMAL HIGH (ref 70–99)

## 2020-08-26 MED ORDER — METHOCARBAMOL 500 MG PO TABS
500.0000 mg | ORAL_TABLET | Freq: Four times a day (QID) | ORAL | 0 refills | Status: DC | PRN
  Filled 2020-08-26: qty 40, 10d supply, fill #0

## 2020-08-26 MED ORDER — TRAMADOL HCL 50 MG PO TABS
50.0000 mg | ORAL_TABLET | Freq: Four times a day (QID) | ORAL | 0 refills | Status: DC | PRN
  Filled 2020-08-26: qty 40, 5d supply, fill #0

## 2020-08-26 MED ORDER — HYDROMORPHONE HCL 2 MG PO TABS
2.0000 mg | ORAL_TABLET | Freq: Four times a day (QID) | ORAL | 0 refills | Status: DC | PRN
  Filled 2020-08-26: qty 42, 6d supply, fill #0

## 2020-08-26 NOTE — Progress Notes (Signed)
The patient is alert and oriented and has been seen by his physician. The orders for discharge were written. IV has been removed. Went over discharge instructions with patient and family. He is being discharged via wheelchair with all of his belongings.  

## 2020-08-26 NOTE — Progress Notes (Signed)
Subjective: 1 Day Post-Op Procedure(s) (LRB): Revision right knee unicompartmental arthroplasty to total knee arthroplasty (Right) Patient reports pain as mild.   Patient seen in rounds by Dr. Wynelle Link. Patient is well, and has had no acute complaints or problems. Denies SOB, chest pain, or calf pain. No acute overnight events. Ambulated 150 feet with therapy yesterday. Will continue therapy today.   Objective: Vital signs in last 24 hours: Temp:  [97.6 F (36.4 C)-98.6 F (37 C)] 97.9 F (36.6 C) (07/21 0544) Pulse Rate:  [53-66] 58 (07/21 0653) Resp:  [7-18] 16 (07/21 0544) BP: (145-198)/(73-100) 163/74 (07/21 0653) SpO2:  [96 %-100 %] 100 % (07/21 0544) Weight:  [90.3 kg] 90.3 kg (07/20 0758)  Intake/Output from previous day:  Intake/Output Summary (Last 24 hours) at 08/26/2020 0711 Last data filed at 08/26/2020 0554 Gross per 24 hour  Intake 1365.36 ml  Output 3000 ml  Net -1634.64 ml     Intake/Output this shift: No intake/output data recorded.  Labs: Recent Labs    08/26/20 0257  HGB 11.3*   Recent Labs    08/26/20 0257  WBC 8.7  RBC 3.72*  HCT 35.0*  PLT 171   Recent Labs    08/26/20 0257  NA 135  K 4.9  CL 101  CO2 25  BUN 16  CREATININE 1.09  GLUCOSE 167*  CALCIUM 8.4*   No results for input(s): LABPT, INR in the last 72 hours.  Exam: General - Patient is Alert and Oriented Extremity - Neurologically intact Neurovascular intact Intact pulses distally Dorsiflexion/Plantar flexion intact Dressing - dressing C/D/I Motor Function - intact, moving foot and toes well on exam.   Past Medical History:  Diagnosis Date   Anticoagulated    plavix   Arthritis    Shoulder, knees, back    CAD in native artery cardiologist-  dr Angelena Form   a. CAD/NSTEMI ,  cardiac cath staged stenting-- 12-25-2016  PTCA and DES x3 to prox. and mid LAD;  12-26-2016  PCI to PLA and DES x1 to midRCA,  EF 60-65%.   Chronic lower back pain    CKD (chronic kidney  disease), stage III (HCC)    Complication of anesthesia    "Too much with shoulder surgery", pt. reports that he was told the at they "lost him, due to absorbing too much anesthesia".  shoulder surgery 1985   DDD (degenerative disc disease), lumbosacral    GERD (gastroesophageal reflux disease)    History of gastric ulcer 01/2017   History of kidney stones    History of malignant melanoma    right side of nose   History of non-ST elevation myocardial infarction (NSTEMI) 12/23/2017   s/p  staged cardiac cath,  s/p  PCI and DEStenting   Hyperlipidemia    Hypertension    IDA (iron deficiency anemia)    Myocardial infarction ([place])    Nocturia    RBBB    Renal calculus, right    S/P drug eluting coronary stent placement 12-25-2017,  12-26-2017   PTCA and DES x3 to prox. and mid LAD;  PCI to PLA and DES x1 to midRCA   Type 2 diabetes mellitus treated with insulin ([place])    followed by pcp    Assessment/Plan: 1 Day Post-Op Procedure(s) (LRB): Revision right knee unicompartmental arthroplasty to total knee arthroplasty (Right) Principal Problem:   Pain due to unicompartmental arthroplasty of knee ([place])  Estimated body mass index is 30.26 kg/m as calculated from the following:   Height  as of this encounter: 5\' 8"  (1.727 m).   Weight as of this encounter: 90.3 kg. Up with therapy   Patient's anticipated LOS is less than 2 midnights, meeting these requirements: - Lives within 1 hour of care - Has a competent adult at home to recover with post-op - NO history of  - Chronic pain requiring opiods  - Diabetes  - Coronary Artery Disease  - Heart failure  - Heart attack  - Stroke  - DVT/VTE  - Cardiac arrhythmia  - Respiratory Failure/COPD  - Renal failure  - Anemia  - Advanced Liver disease     DVT Prophylaxis -  Plavix 75mg , Aspirin 325mg  QD Weight bearing as tolerated. Continue therapy.  Plan is to go Home after hospital stay.  Plan for one session with PT this morning,  and if meeting goals, will plan for discharge this afternoon.   Patient to follow up in two weeks with Dr. Wynelle Link in clinic.   The PDMP database was reviewed today prior to any opioid medications being prescribed to this patient.Fenton Foy, MBA, PA-C Orthopedic Surgery 08/26/2020, 7:11 AM

## 2020-08-26 NOTE — Plan of Care (Signed)
  Problem: Health Behavior/Discharge Planning: Goal: Ability to manage health-related needs will improve Outcome: Progressing   Problem: Pain Managment: Goal: General experience of comfort will improve Outcome: Progressing   

## 2020-08-26 NOTE — Progress Notes (Signed)
Physical Therapy Treatment Patient Details Name: Matthew [name] MRN: [id number] DOB: 1940-09-15 Today's Date: 08/26/2020    History of Present Illness Pt is 80 yo male admitted on 08/25/20 with failed R knee unicompartmental replacement and is now s/p revision of R knee unicompartmental replacement to TKA.  Pt with hx of arthritis, back pain, HLD, HTN, MI, DM, L TSA.    PT Comments    Pt is progressing well and is ready to DC home from a mobility standpoint. He ambulated 180' with RW, completed stair training, and demonstrates good understanding of HEP.     Follow Up Recommendations  Follow surgeon's recommendation for DC plan and follow-up therapies;Supervision for mobility/OOB     Equipment Recommendations  3in1 (PT)    Recommendations for Other Services       Precautions / Restrictions Precautions Precautions: Fall;Knee Precaution Booklet Issued: Yes (comment) Precaution Comments: reviewed no pillow under knee Restrictions Weight Bearing Restrictions: No RLE Weight Bearing: Weight bearing as tolerated    Mobility  Bed Mobility Overal bed mobility: Modified Independent             General bed mobility comments: used rail    Transfers Overall transfer level: Modified independent Equipment used: Rolling walker (2 wheeled) Transfers: Sit to/from Stand Sit to Stand: Supervision         General transfer comment: VCs hand placement  Ambulation/Gait Ambulation/Gait assistance: Supervision Gait Distance (Feet): 180 Feet Assistive device: Rolling walker (2 wheeled) Gait Pattern/deviations: Step-to pattern;Decreased stride length;Decreased stance time - right;Decreased weight shift to right Gait velocity: decreased   General Gait Details: good sequencing, no loss of balance, no buckling   Stairs Stairs: Yes Stairs assistance: Min assist Stair Management: No rails;Backwards Number of Stairs: 2 General stair comments: VCs sequencing, min A to manage/steady  RW   Wheelchair Mobility    Modified Rankin (Stroke Patients Only)       Balance Overall balance assessment: Needs assistance Sitting-balance support: No upper extremity supported Sitting balance-Leahy Scale: Good     Standing balance support: Bilateral upper extremity supported;No upper extremity supported Standing balance-Leahy Scale: Fair Standing balance comment: RW to ambulate; static stand without AD                            Cognition Arousal/Alertness: Awake/alert Behavior During Therapy: WFL for tasks assessed/performed Overall Cognitive Status: Within Functional Limits for tasks assessed                                        Exercises Total Joint Exercises Ankle Circles/Pumps: AROM;10 reps;Supine;Both Quad Sets: AROM;10 reps;Both;Supine Short Arc Quad: AROM;Right;10 reps;Supine Heel Slides: AAROM;Right;10 reps;Supine Hip ABduction/ADduction: AROM;Right;10 reps;Supine Straight Leg Raises: AROM;Right;5 reps;Supine Long Arc Quad: AROM;Right;5 reps;Standing Knee Flexion: AAROM;Right;10 reps;Seated Goniometric ROM: 0-60* AAROm R knee    General Comments        Pertinent Vitals/Pain Pain Score: 5  Pain Location: R knee with walking Pain Descriptors / Indicators: Discomfort Pain Intervention(s): Limited activity within patient's tolerance;Monitored during session;Premedicated before session;Ice applied    Home Living                      Prior Function            PT Goals (current goals can now be found in the care plan section) Acute Rehab PT Goals  Patient Stated Goal: return home PT Goal Formulation: With patient/family Time For Goal Achievement: 09/08/20 Potential to Achieve Goals: Good Progress towards PT goals: Goals met/education completed, patient discharged from PT    Frequency    7X/week      PT Plan Current plan remains appropriate    Co-evaluation              AM-PAC PT "6 Clicks"  Mobility   Outcome Measure  Help needed turning from your back to your side while in a flat bed without using bedrails?: A Little Help needed moving from lying on your back to sitting on the side of a flat bed without using bedrails?: A Little Help needed moving to and from a bed to a chair (including a wheelchair)?: None Help needed standing up from a chair using your arms (e.g., wheelchair or bedside chair)?: None Help needed to walk in hospital room?: None Help needed climbing 3-5 steps with a railing? : A Little 6 Click Score: 21    End of Session Equipment Utilized During Treatment: Gait belt Activity Tolerance: Patient tolerated treatment well Patient left: with chair alarm set;with call bell/phone within reach;in chair Nurse Communication: Mobility status PT Visit Diagnosis: Unsteadiness on feet (R26.81);Muscle weakness (generalized) (M62.81)     Time: 5107-1252 PT Time Calculation (min) (ACUTE ONLY): 27 min  Charges:  $Gait Training: 8-22 mins $Therapeutic Exercise: 8-22 mins                    Blondell Reveal Kistler PT 08/26/2020  Acute Rehabilitation Services Pager [phone] Office [phone]

## 2020-08-30 ENCOUNTER — Ambulatory Visit: Payer: Medicare Other | Attending: Student | Admitting: Physical Therapy

## 2020-08-30 ENCOUNTER — Other Ambulatory Visit: Payer: Self-pay

## 2020-08-30 ENCOUNTER — Encounter: Payer: Self-pay | Admitting: Physical Therapy

## 2020-08-30 DIAGNOSIS — G8929 Other chronic pain: Secondary | ICD-10-CM | POA: Insufficient documentation

## 2020-08-30 DIAGNOSIS — M25561 Pain in right knee: Secondary | ICD-10-CM | POA: Diagnosis not present

## 2020-08-30 DIAGNOSIS — M25661 Stiffness of right knee, not elsewhere classified: Secondary | ICD-10-CM

## 2020-08-30 DIAGNOSIS — R600 Localized edema: Secondary | ICD-10-CM | POA: Diagnosis not present

## 2020-08-30 DIAGNOSIS — M6281 Muscle weakness (generalized): Secondary | ICD-10-CM | POA: Diagnosis not present

## 2020-08-30 NOTE — Therapy (Signed)
[facility] [place], [place], [address] Phone: [phone]   Fax:  [phone]  Physical Therapy Evaluation  Patient Details  Name: Matthew [name] MRN: [id number] Date of Birth: [birth year] Referring Provider (PT): Gaynelle Arabian MD   Encounter Date: 08/30/2020   PT End of Session - 08/30/20 1238     Visit Number 1    Number of Visits 12    Date for PT Re-Evaluation 09/27/20    Authorization Type FOTO AT LEAST EVERY 5TH VISIT.  PROGRESS NOTE AT 10TH VISIT.  KX MODIFIER AFTER 15 VISITS.    PT Start Time 1030    PT Stop Time 1122    PT Time Calculation (min) 52 min    Activity Tolerance Patient tolerated treatment well    Behavior During Therapy WFL for tasks assessed/performed             Past Medical History:  Diagnosis Date   Anticoagulated    plavix   Arthritis    Shoulder, knees, back    CAD in native artery cardiologist-  dr Angelena Form   a. CAD/NSTEMI ,  cardiac cath staged stenting-- 12-25-2016  PTCA and DES x3 to prox. and mid LAD;  12-26-2016  PCI to PLA and DES x1 to midRCA,  EF 60-65%.   Chronic lower back pain    CKD (chronic kidney disease), stage III (HCC)    Complication of anesthesia    "Too much with shoulder surgery", pt. reports that he was told the at they "lost him, due to absorbing too much anesthesia".  shoulder surgery 1985   DDD (degenerative disc disease), lumbosacral    GERD (gastroesophageal reflux disease)    History of gastric ulcer 01/2017   History of kidney stones    History of malignant melanoma    right side of nose   History of non-ST elevation myocardial infarction (NSTEMI) 12/23/2017   s/p  staged cardiac cath,  s/p  PCI and DEStenting   Hyperlipidemia    Hypertension    IDA (iron deficiency anemia)    Myocardial infarction ([place])    Nocturia    RBBB    Renal calculus, right    S/P drug eluting coronary stent placement 12-25-2017,  12-26-2017   PTCA and DES x3 to prox.  and mid LAD;  PCI to PLA and DES x1 to midRCA   Type 2 diabetes mellitus treated with insulin ([place])    followed by pcp    Past Surgical History:  Procedure Laterality Date   ANAL FISSURE REPAIR  X 2   CATARACT EXTRACTION W/ INTRAOCULAR LENS  IMPLANT, BILATERAL Bilateral 2017;  2015   COLONOSCOPY WITH PROPOFOL N/A 06/05/2016   Procedure: COLONOSCOPY WITH PROPOFOL;  Surgeon: Garlan Fair, MD;  Location: [facility];  Service: Endoscopy;  Laterality: N/A;   CONVERSION TO TOTAL KNEE Right 08/25/2020   Procedure: Revision right knee unicompartmental arthroplasty to total knee arthroplasty;  Surgeon: Gaynelle Arabian, MD;  Location: [facility];  Service: Orthopedics;  Laterality: Right;   CORONARY ANGIOGRAPHY N/A 12/26/2016   Procedure: CORONARY ANGIOGRAPHY;  Surgeon: Troy Sine, MD;  Location: [facility];  Service: Cardiovascular;  Laterality: N/A;   CORONARY STENT INTERVENTION N/A 12/25/2016   Procedure: CORONARY STENT INTERVENTION;  Surgeon: Burnell Blanks, MD;  Location: [place] [facility];  Service: Cardiovascular;  Laterality: N/A;   CORONARY STENT INTERVENTION N/A 12/26/2016   Procedure: CORONARY STENT INTERVENTION;  Surgeon: Troy Sine, MD;  Location:  [facility];  Service: Cardiovascular;  Laterality: N/A;   CYSTOSCOPY/URETEROSCOPY/HOLMIUM LASER/STENT PLACEMENT Right 01/07/2018   Procedure: RIGHT URETEROSCOPY/HOLMIUM LASER/STENT PLACEMENT;  Surgeon: Lucas Mallow, MD;  [facility];  Service: Urology;  Laterality: Right;   ESOPHAGOGASTRODUODENOSCOPY (EGD) WITH PROPOFOL N/A 01/12/2017   Procedure: ESOPHAGOGASTRODUODENOSCOPY (EGD) WITH PROPOFOL;  Surgeon: Wilford Corner, MD;  Location: [facility];  Service: Endoscopy;  Laterality: N/A;   HAND TENDON SURGERY Right 10-29-2002   dr Burney Gauze '@[facility]'$    right index and thumb   KNEE ARTHROSCOPY Bilateral 2009-;2010   '@[facility]'$    LEFT HEART CATH AND CORONARY ANGIOGRAPHY N/A 12/25/2016    Procedure: LEFT HEART CATH AND CORONARY ANGIOGRAPHY;  Surgeon: Burnell Blanks, MD;  Location: [facility];  Service: Cardiovascular;  Laterality: N/A;   LEFT HEART CATH AND CORONARY ANGIOGRAPHY N/A 10/10/2019   Procedure: LEFT HEART CATH AND CORONARY ANGIOGRAPHY;  Surgeon: Troy Sine, MD;  Location: [facility];  Service: Cardiovascular;  Laterality: N/A;   LUMBAR LAMINECTOMY/DECOMPRESSION MICRODISCECTOMY N/A 06/09/2015   Procedure: Laminectomy - T12-L1;  Surgeon: Eustace Moore, MD;  Location: [facility];  Service: Neurosurgery;  Laterality: N/A;  Laminectomy - T12-L1   MEDIAL PARTIAL KNEE REPLACEMENT Bilateral 2009-2010    [facility]    MELANOMA EXCISION Right    "side of my nose"   SHOULDER SURGERY Left 1985   TOTAL SHOULDER ARTHROPLASTY Left 12/06/2012   Procedure: LEFT TOTAL SHOULDER ARTHROPLASTY VERSES A REVERSE TOTAL SHOULDER ARTHROPLASTY;  Surgeon: Augustin Schooling, MD;  Location: [facility];  Service: Orthopedics;  Laterality: Left;    There were no vitals filed for this visit.    Subjective Assessment - 08/30/20 1236     Pertinent History CAD, coronary stent, chroinic low back pain, left TSA, right hand surgery, HTN, low back surgery, prior right knee surgery.    How long can you walk comfortably? Around home with a FWW.    Patient Stated Goals Get around without pain.    Currently in Pain? Yes    Pain Score 4     Pain Location Knee    Pain Orientation Right    Pain Descriptors / Indicators Aching;Numbness    Pain Type Surgical pain    Pain Onset More than a month ago    Pain Frequency Constant    Aggravating Factors  Increased up time.    Pain Relieving Factors Rest.                [facility] PT Assessment - 08/30/20 0001       Assessment   Medical Diagnosis Right total knee replacement    Referring Provider (PT) Gaynelle Arabian MD    Onset Date/Surgical Date --   08/25/20 (surgery date).     Precautions   Precaution Comments No ultrasound.       Restrictions   Weight Bearing Restrictions No      Balance Screen   Has the patient fallen in the past 6 months No    Has the patient had a decrease in activity level because of a fear of falling?  No    Is the patient reluctant to leave their home because of a fear of falling?  No      Home Environment   Living Environment Private residence      Prior Function   Level of Independence Independent      Observation/Other Assessments   Observations Aquacel intact over patient's right knee with  TED hose donned.    Focus on Therapeutic Outcomes (FOTO)  Complete.      Observation/Other Assessments-Edema    Edema Circumferential      Circumferential Edema   Circumferential - Right RT 4 cms > LT.      ROM / Strength   AROM / PROM / Strength AROM;Strength      AROM   Overall AROM Comments -15 degrees to -10 degrees of passive right knee extension with active flexion to 70 degrees and passive to 75 degrees.      Strength   Overall Strength Comments Right hip flexion and right knee extension is currently 3-/5.      Palpation   Palpation comment Diffuse right anterior knee pain.      Transfers   Comments Sit to supine and supine to sit with assist provided to patient's right LE.      Ambulation/Gait   Gait Comments Step-to gait pattern with a FWW.                        Objective measurements completed on examination: See above findings.       [facility] Adult PT Treatment/Exercise - 08/30/20 0001       Exercises   Exercises Knee/Hip      Knee/Hip Exercises: Aerobic   Nustep Level 1 x 8 minutes moving forward x 1 to increase knee flexion.      Modalities   Modalities Vasopneumatic      Vasopneumatic   Number Minutes Vasopneumatic  15 minutes    Vasopnuematic Location  --   Right knee.   Vasopneumatic Pressure Low                         PT Long Term Goals - 08/30/20 1301       PT LONG TERM GOAL #1   Title Independent with a HEP.     Time 4    Period Weeks    Status New      PT LONG TERM GOAL #2   Title Full active right knee extension in order to normalize gait.    Time 4    Period Weeks    Status New      PT LONG TERM GOAL #3   Title Active knee flexion to 115 degrees+ so the patient can perform functional tasks and do so with pain not > 2-3/10.    Time 4    Period Weeks    Status New      PT LONG TERM GOAL #4   Title Increaseright  knee and hip strength to a solid 4+/5 to provide good stability for accomplishment of functional activities.    Time 4    Period Weeks    Status New      PT LONG TERM GOAL #5   Title Perform a reciprocating stair gait with one railing with pain not > 2-3/10.    Time 4    Period Weeks    Status New                    Plan - 08/30/20 1254     Clinical Impression Statement The patient presents to OPPT s/p right total knee replacement performed on 08/25/20.  He is walking safely with a FWW.  He currently has a loss of right knee flexion and extension and right knee and hip strength losses.  He has an expected amount  of edema.  He is compliant to his HEP and wearing his TED hose.  Patient will benefit from skilled physical therapy intervention to address pain and deficits.    Personal Factors and Comorbidities Other;Comorbidity 1;Comorbidity 2    Comorbidities CAD, coronary stent, chroinic low back pain, left TSA, right hand surgery, HTN, low back surgery, prior right knee surgery.    Examination-Activity Limitations Other;Transfers;Locomotion Level;Stairs    Examination-Participation Restrictions Other    Stability/Clinical Decision Making Stable/Uncomplicated    Clinical Decision Making Low    Rehab Potential Excellent    PT Frequency 3x / week    PT Duration 4 weeks    PT Treatment/Interventions ADLs/Self Care Home Management;Cryotherapy;Electrical Stimulation;Moist Heat;Functional mobility Arts administrator;Therapeutic activities;Therapeutic  exercise;Neuromuscular re-education;Manual techniques;Patient/family education;Passive range of motion;Vasopneumatic Device    PT Next Visit Plan Progress into TKA protocol.  Vasopneumatic.    Consulted and Agree with Plan of Care Patient             Patient will benefit from skilled therapeutic intervention in order to improve the following deficits and impairments:  Pain, Abnormal gait, Difficulty walking, Decreased activity tolerance, Decreased mobility, Decreased range of motion, Decreased strength, Increased edema  Visit Diagnosis: Chronic pain of right knee - Plan: PT plan of care cert/re-cert  Stiffness of right knee, not elsewhere classified - Plan: PT plan of care cert/re-cert  Localized edema - Plan: PT plan of care cert/re-cert  Muscle weakness (generalized) - Plan: PT plan of care cert/re-cert     Problem List Patient Active Problem List   Diagnosis Date Noted   Pain due to unicompartmental arthroplasty of knee ([place]) 08/25/2020   Peripheral arterial disease ([place]) 11/19/2019   RBBB 01/16/2017   PUD (peptic ulcer disease) 01/16/2017   Melena 01/12/2017   Unstable angina ([place]) 01/11/2017   CAD S/P percutaneous coronary angioplasty 01/09/2017   Mixed hyperlipidemia 01/09/2017   DM type 2 causing vascular disease ([place]) 12/24/2016   Essential hypertension, benign 12/24/2016   Chest pain, rule out acute myocardial infarction 12/24/2016   ACS (acute coronary syndrome) [facility])    History of NSTEMI    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 [place], [place], [address] Phone: [phone]   Fax:  [phone]  Name: Matthew [name] MRN: [id number] Date of Birth: Oct 12, 1940

## 2020-09-01 ENCOUNTER — Other Ambulatory Visit: Payer: Self-pay

## 2020-09-01 ENCOUNTER — Ambulatory Visit: Payer: Medicare Other | Admitting: Physical Therapy

## 2020-09-01 DIAGNOSIS — M25561 Pain in right knee: Secondary | ICD-10-CM | POA: Diagnosis not present

## 2020-09-01 DIAGNOSIS — G8929 Other chronic pain: Secondary | ICD-10-CM

## 2020-09-01 DIAGNOSIS — M6281 Muscle weakness (generalized): Secondary | ICD-10-CM

## 2020-09-01 DIAGNOSIS — R600 Localized edema: Secondary | ICD-10-CM | POA: Diagnosis not present

## 2020-09-01 DIAGNOSIS — M25661 Stiffness of right knee, not elsewhere classified: Secondary | ICD-10-CM | POA: Diagnosis not present

## 2020-09-01 NOTE — Therapy (Signed)
[facility] [place], [place], [address] Phone: [phone]   Fax:  [phone]  Physical Therapy Treatment  Patient Details  Name: Matthew [name] MRN: [id number] Date of Birth: 1940/10/01 Referring Provider (PT): Gaynelle Arabian MD   Encounter Date: 09/01/2020   PT End of Session - 09/01/20 1123     Visit Number 2    Number of Visits 12    Date for PT Re-Evaluation 09/27/20    Authorization Type FOTO AT LEAST EVERY 5TH VISIT.  PROGRESS NOTE AT 10TH VISIT.  KX MODIFIER AFTER 15 VISITS.    PT Start Time 0945    PT Stop Time 1022    PT Time Calculation (min) 37 min    Activity Tolerance Patient tolerated treatment well             Past Medical History:  Diagnosis Date   Anticoagulated    plavix   Arthritis    Shoulder, knees, back    CAD in native artery cardiologist-  dr Angelena Form   a. CAD/NSTEMI ,  cardiac cath staged stenting-- 12-25-2016  PTCA and DES x3 to prox. and mid LAD;  12-26-2016  PCI to PLA and DES x1 to midRCA,  EF 60-65%.   Chronic lower back pain    CKD (chronic kidney disease), stage III (HCC)    Complication of anesthesia    "Too much with shoulder surgery", pt. reports that he was told the at they "lost him, due to absorbing too much anesthesia".  shoulder surgery 1985   DDD (degenerative disc disease), lumbosacral    GERD (gastroesophageal reflux disease)    History of gastric ulcer 01/2017   History of kidney stones    History of malignant melanoma    right side of nose   History of non-ST elevation myocardial infarction (NSTEMI) 12/23/2017   s/p  staged cardiac cath,  s/p  PCI and DEStenting   Hyperlipidemia    Hypertension    IDA (iron deficiency anemia)    Myocardial infarction ([place])    Nocturia    RBBB    Renal calculus, right    S/P drug eluting coronary stent placement 12-25-2017,  12-26-2017   PTCA and DES x3 to prox. and mid LAD;  PCI to PLA and DES x1 to midRCA   Type 2  diabetes mellitus treated with insulin ([place])    followed by pcp    Past Surgical History:  Procedure Laterality Date   ANAL FISSURE REPAIR  X 2   CATARACT EXTRACTION W/ INTRAOCULAR LENS  IMPLANT, BILATERAL Bilateral 2017;  2015   COLONOSCOPY WITH PROPOFOL N/A 06/05/2016   Procedure: COLONOSCOPY WITH PROPOFOL;  Surgeon: Garlan Fair, MD;  [facility]: [facility];  Service: Endoscopy;  Laterality: N/A;   CONVERSION TO TOTAL KNEE Right 08/25/2020   Procedure: Revision right knee unicompartmental arthroplasty to total knee arthroplasty;  Surgeon: Gaynelle Arabian, MD;  [facility]: [facility];  Service: Orthopedics;  Laterality: Right;   CORONARY ANGIOGRAPHY N/A 12/26/2016   Procedure: CORONARY ANGIOGRAPHY;  Surgeon: Troy Sine, MD;  Location: [place] [facility];  Service: Cardiovascular;  Laterality: N/A;   CORONARY STENT INTERVENTION N/A 12/25/2016   Procedure: CORONARY STENT INTERVENTION;  Surgeon: Burnell Blanks, MD;  Location: [place] [facility];  Service: Cardiovascular;  Laterality: N/A;   CORONARY STENT INTERVENTION N/A 12/26/2016   Procedure: CORONARY STENT INTERVENTION;  Surgeon: Troy Sine, MD;  Location: [place] [facility];  Service: Cardiovascular;  Laterality: N/A;  CYSTOSCOPY/URETEROSCOPY/HOLMIUM LASER/STENT PLACEMENT Right 01/07/2018   Procedure: RIGHT URETEROSCOPY/HOLMIUM LASER/STENT PLACEMENT;  Surgeon: Lucas Mallow, MD;  Location: [facility];  Service: Urology;  Laterality: Right;   ESOPHAGOGASTRODUODENOSCOPY (EGD) WITH PROPOFOL N/A 01/12/2017   Procedure: ESOPHAGOGASTRODUODENOSCOPY (EGD) WITH PROPOFOL;  Surgeon: Wilford Corner, MD;  Location: [place];  Service: Endoscopy;  Laterality: N/A;   HAND TENDON SURGERY Right 10-29-2002   dr Burney Gauze '@[facility]'$    right index and thumb   KNEE ARTHROSCOPY Bilateral 2009-;2010   '@[facility]'$    LEFT HEART CATH AND CORONARY ANGIOGRAPHY N/A 12/25/2016   Procedure: LEFT HEART CATH AND CORONARY ANGIOGRAPHY;   Surgeon: Burnell Blanks, MD;  Location: [place] [facility];  Service: Cardiovascular;  Laterality: N/A;   LEFT HEART CATH AND CORONARY ANGIOGRAPHY N/A 10/10/2019   Procedure: LEFT HEART CATH AND CORONARY ANGIOGRAPHY;  Surgeon: Troy Sine, MD;  Location: [place] [facility];  Service: Cardiovascular;  Laterality: N/A;   LUMBAR LAMINECTOMY/DECOMPRESSION MICRODISCECTOMY N/A 06/09/2015   Procedure: Laminectomy - T12-L1;  Surgeon: Eustace Moore, MD;  Location: [place] [facility];  Service: Neurosurgery;  Laterality: N/A;  Laminectomy - T12-L1   MEDIAL PARTIAL KNEE REPLACEMENT Bilateral 2009-2010    [facility]    MELANOMA EXCISION Right    "side of my nose"   SHOULDER SURGERY Left 1985   TOTAL SHOULDER ARTHROPLASTY Left 12/06/2012   Procedure: LEFT TOTAL SHOULDER ARTHROPLASTY VERSES A REVERSE TOTAL SHOULDER ARTHROPLASTY;  Surgeon: Augustin Schooling, MD;  Location: [place];  Service: Orthopedics;  Laterality: Left;    There were no vitals filed for this visit.   Subjective Assessment - 09/01/20 1118     Subjective COVID-19 screen performed prior to patient entering clinic.  Quite a bit of pain today.    Pertinent History CAD, coronary stent, chroinic low back pain, left TSA, right hand surgery, HTN, low back surgery, prior right knee surgery.    How long can you walk comfortably? Around home with a FWW.    Patient Stated Goals Get around without pain.    Currently in Pain? Yes    Pain Score 6     Pain Location Knee    Pain Orientation Right    Pain Descriptors / Indicators Aching;Numbness    Pain Type Surgical pain    Pain Onset More than a month ago                [facility] PT Assessment - 09/01/20 0001       AROM   AROM Assessment Site Knee    Right/Left Knee Right    Right Knee Flexion 82                           [facility] Adult PT Treatment/Exercise - 09/01/20 0001       Exercises   Exercises Knee/Hip      Knee/Hip Exercises: Aerobic   Nustep Level 1 x 15  minutes moving seat forward x 2 to increase knee flexion.      Vasopneumatic   Number Minutes Vasopneumatic  15 minutes    Vasopnuematic Location  --   RT knee.   Vasopneumatic Pressure Low      Manual Therapy   Manual Therapy Passive ROM    Passive ROM In supine:  Right knee passive range of motion into flexion and extension x 8 minutes with low load long duration stretching technique utilized.  PT Long Term Goals - 08/30/20 1301       PT LONG TERM GOAL #1   Title Independent with a HEP.    Time 4    Period Weeks    Status New      PT LONG TERM GOAL #2   Title Full active right knee extension in order to normalize gait.    Time 4    Period Weeks    Status New      PT LONG TERM GOAL #3   Title Active knee flexion to 115 degrees+ so the patient can perform functional tasks and do so with pain not > 2-3/10.    Time 4    Period Weeks    Status New      PT LONG TERM GOAL #4   Title Increaseright  knee and hip strength to a solid 4+/5 to provide good stability for accomplishment of functional activities.    Time 4    Period Weeks    Status New      PT LONG TERM GOAL #5   Title Perform a reciprocating stair gait with one railing with pain not > 2-3/10.    Time 4    Period Weeks    Status New                   Plan - 09/01/20 1121     Clinical Impression Statement Patient did well today and was able to achieve right knee flexion to 82 degrees.    Personal Factors and Comorbidities Other;Comorbidity 1;Comorbidity 2    Comorbidities CAD, coronary stent, chroinic low back pain, left TSA, right hand surgery, HTN, low back surgery, prior right knee surgery.    Examination-Activity Limitations Other;Transfers;Locomotion Level;Stairs    Examination-Participation Restrictions Other    Stability/Clinical Decision Making Stable/Uncomplicated    Rehab Potential Excellent    PT Frequency 3x / week    PT Duration 4 weeks    PT  Treatment/Interventions ADLs/Self Care Home Management;Cryotherapy;Electrical Stimulation;Moist Heat;Functional mobility Arts administrator;Therapeutic activities;Therapeutic exercise;Neuromuscular re-education;Manual techniques;Patient/family education;Passive range of motion;Vasopneumatic Device    PT Next Visit Plan Progress into TKA protocol.  Vasopneumatic.    Consulted and Agree with Plan of Care Patient             Patient will benefit from skilled therapeutic intervention in order to improve the following deficits and impairments:  Pain, Abnormal gait, Difficulty walking, Decreased activity tolerance, Decreased mobility, Decreased range of motion, Decreased strength, Increased edema  Visit Diagnosis: Muscle weakness (generalized)  Chronic pain of right knee  Stiffness of right knee, not elsewhere classified  Localized edema     Problem List Patient Active Problem List   Diagnosis Date Noted   Pain due to unicompartmental arthroplasty of knee ([place]) 08/25/2020   Peripheral arterial disease ([place]) 11/19/2019   RBBB 01/16/2017   PUD (peptic ulcer disease) 01/16/2017   Melena 01/12/2017   Unstable angina ([place]) 01/11/2017   CAD S/P percutaneous coronary angioplasty 01/09/2017   Mixed hyperlipidemia 01/09/2017   DM type 2 causing vascular disease ([place]) 12/24/2016   Essential hypertension, benign 12/24/2016   Chest pain, rule out acute myocardial infarction 12/24/2016   ACS (acute coronary syndrome) [facility])    History of NSTEMI    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

## 2020-09-03 ENCOUNTER — Encounter: Payer: Self-pay | Admitting: Physical Therapy

## 2020-09-03 ENCOUNTER — Ambulatory Visit: Payer: Medicare Other | Admitting: Physical Therapy

## 2020-09-03 ENCOUNTER — Other Ambulatory Visit: Payer: Self-pay

## 2020-09-03 DIAGNOSIS — M25661 Stiffness of right knee, not elsewhere classified: Secondary | ICD-10-CM

## 2020-09-03 DIAGNOSIS — M6281 Muscle weakness (generalized): Secondary | ICD-10-CM

## 2020-09-03 DIAGNOSIS — R600 Localized edema: Secondary | ICD-10-CM | POA: Diagnosis not present

## 2020-09-03 DIAGNOSIS — M25561 Pain in right knee: Secondary | ICD-10-CM

## 2020-09-03 DIAGNOSIS — G8929 Other chronic pain: Secondary | ICD-10-CM | POA: Diagnosis not present

## 2020-09-03 NOTE — Therapy (Signed)
[facility] [place], [place], [address] Phone: [phone]   Fax:  [phone]  Physical Therapy Treatment  Patient Details  Name: Matthew [name] MRN: [id number] Date of Birth: 02/12/1940 Referring Provider (PT): Gaynelle Arabian MD   Encounter Date: 09/03/2020   PT End of Session - 09/03/20 0958     Visit Number 3    Number of Visits 12    Date for PT Re-Evaluation 09/27/20    Authorization Type FOTO AT LEAST EVERY 5TH VISIT.  PROGRESS NOTE AT 10TH VISIT.  KX MODIFIER AFTER 15 VISITS.    PT Start Time 0950    PT Stop Time 1035    PT Time Calculation (min) 45 min    Activity Tolerance Patient tolerated treatment well    Behavior During Therapy WFL for tasks assessed/performed             Past Medical History:  Diagnosis Date   Anticoagulated    plavix   Arthritis    Shoulder, knees, back    CAD in native artery cardiologist-  dr Angelena Form   a. CAD/NSTEMI ,  cardiac cath staged stenting-- 12-25-2016  PTCA and DES x3 to prox. and mid LAD;  12-26-2016  PCI to PLA and DES x1 to midRCA,  EF 60-65%.   Chronic lower back pain    CKD (chronic kidney disease), stage III (HCC)    Complication of anesthesia    "Too much with shoulder surgery", pt. reports that he was told the at they "lost him, due to absorbing too much anesthesia".  shoulder surgery 1985   DDD (degenerative disc disease), lumbosacral    GERD (gastroesophageal reflux disease)    History of gastric ulcer 01/2017   History of kidney stones    History of malignant melanoma    right side of nose   History of non-ST elevation myocardial infarction (NSTEMI) 12/23/2017   s/p  staged cardiac cath,  s/p  PCI and DEStenting   Hyperlipidemia    Hypertension    IDA (iron deficiency anemia)    Myocardial infarction ([place])    Nocturia    RBBB    Renal calculus, right    S/P drug eluting coronary stent placement 12-25-2017,  12-26-2017   PTCA and DES x3 to prox.  and mid LAD;  PCI to PLA and DES x1 to midRCA   Type 2 diabetes mellitus treated with insulin ([place])    followed by pcp    Past Surgical History:  Procedure Laterality Date   ANAL FISSURE REPAIR  X 2   CATARACT EXTRACTION W/ INTRAOCULAR LENS  IMPLANT, BILATERAL Bilateral 2017;  2015   COLONOSCOPY WITH PROPOFOL N/A 06/05/2016   Procedure: COLONOSCOPY WITH PROPOFOL;  Surgeon: Garlan Fair, MD;  [facility];  Service: Endoscopy;  Laterality: N/A;   CONVERSION TO TOTAL KNEE Right 08/25/2020   Procedure: Revision right knee unicompartmental arthroplasty to total knee arthroplasty;  Surgeon: Gaynelle Arabian, MD;  [facility];  Service: Orthopedics;  Laterality: Right;   CORONARY ANGIOGRAPHY N/A 12/26/2016   Procedure: CORONARY ANGIOGRAPHY;  Surgeon: Troy Sine, MD;  Location: [place] [facility];  Service: Cardiovascular;  Laterality: N/A;   CORONARY STENT INTERVENTION N/A 12/25/2016   Procedure: CORONARY STENT INTERVENTION;  Surgeon: Burnell Blanks, MD;  Location: [place] [facility];  Service: Cardiovascular;  Laterality: N/A;   CORONARY STENT INTERVENTION N/A 12/26/2016   Procedure: CORONARY STENT INTERVENTION;  Surgeon: Troy Sine, MD;  Location:  [place] [facility];  Service: Cardiovascular;  Laterality: N/A;   CYSTOSCOPY/URETEROSCOPY/HOLMIUM LASER/STENT PLACEMENT Right 01/07/2018   Procedure: RIGHT URETEROSCOPY/HOLMIUM LASER/STENT PLACEMENT;  Surgeon: Lucas Mallow, MD;  Location: [facility];  Service: Urology;  Laterality: Right;   ESOPHAGOGASTRODUODENOSCOPY (EGD) WITH PROPOFOL N/A 01/12/2017   Procedure: ESOPHAGOGASTRODUODENOSCOPY (EGD) WITH PROPOFOL;  Surgeon: Wilford Corner, MD;  Location: [place];  Service: Endoscopy;  Laterality: N/A;   HAND TENDON SURGERY Right 10-29-2002   dr Burney Gauze '@[facility]'$    right index and thumb   KNEE ARTHROSCOPY Bilateral 2009-;2010   '@[facility]    LEFT HEART CATH AND CORONARY ANGIOGRAPHY N/A 12/25/2016    Procedure: LEFT HEART CATH AND CORONARY ANGIOGRAPHY;  Surgeon: Burnell Blanks, MD;  Location: [place] [facility];  Service: Cardiovascular;  Laterality: N/A;   LEFT HEART CATH AND CORONARY ANGIOGRAPHY N/A 10/10/2019   Procedure: LEFT HEART CATH AND CORONARY ANGIOGRAPHY;  Surgeon: Troy Sine, MD;  Location: [place] [facility];  Service: Cardiovascular;  Laterality: N/A;   LUMBAR LAMINECTOMY/DECOMPRESSION MICRODISCECTOMY N/A 06/09/2015   Procedure: Laminectomy - T12-L1;  Surgeon: Eustace Moore, MD;  Location: [place] [facility];  Service: Neurosurgery;  Laterality: N/A;  Laminectomy - T12-L1   MEDIAL PARTIAL KNEE REPLACEMENT Bilateral 2009-2010    [facility]    MELANOMA EXCISION Right    "side of my nose"   SHOULDER SURGERY Left 1985   TOTAL SHOULDER ARTHROPLASTY Left 12/06/2012   Procedure: LEFT TOTAL SHOULDER ARTHROPLASTY VERSES A REVERSE TOTAL SHOULDER ARTHROPLASTY;  Surgeon: Augustin Schooling, MD;  Location: [place];  Service: Orthopedics;  Laterality: Left;    There were no vitals filed for this visit.   Subjective Assessment - 09/03/20 0947     Subjective COVID-19 screen performed prior to patient entering clinic.  Quite a bit of pain today and took pain medication this morning at 6:30 am.    Pertinent History CAD, coronary stent, chroinic low back pain, left TSA, right hand surgery, HTN, low back surgery, prior right knee surgery.    How long can you walk comfortably? Around home with a FWW.    Patient Stated Goals Get around without pain.    Currently in Pain? Yes    Pain Score --   0/10 at rest, 10/10 with knee flexion   Pain Location Knee    Pain Orientation Right    Pain Descriptors / Indicators Discomfort    Pain Type Surgical pain    Pain Onset More than a month ago    Pain Frequency Intermittent                [facility] PT Assessment - 09/03/20 0001       Assessment   Medical Diagnosis Right total knee replacement    Referring Provider (PT) Gaynelle Arabian MD    Onset  Date/Surgical Date 08/25/20      Precautions   Precaution Comments No ultrasound.      Restrictions   Weight Bearing Restrictions No                           [facility] Adult PT Treatment/Exercise - 09/03/20 0001       Knee/Hip Exercises: Aerobic   Nustep L2, seat 10-8 x15 min      Knee/Hip Exercises: Standing   Hip Flexion AROM;Right;15 reps;Knee bent    Forward Lunges Right;15 reps;3 seconds    Forward Lunges Limitations off 6" step    Rocker Board  3 minutes      Knee/Hip Exercises: Supine   Quad Sets Other (comment)   deficient with active quad set     Modalities   Modalities Vasopneumatic      Vasopneumatic   Number Minutes Vasopneumatic  15 minutes    Vasopnuematic Location  Knee    Vasopneumatic Pressure Low    Vasopneumatic Temperature  55/pain and edema                         PT Long Term Goals - 08/30/20 1301       PT LONG TERM GOAL #1   Title Independent with a HEP.    Time 4    Period Weeks    Status New      PT LONG TERM GOAL #2   Title Full active right knee extension in order to normalize gait.    Time 4    Period Weeks    Status New      PT LONG TERM GOAL #3   Title Active knee flexion to 115 degrees+ so the patient can perform functional tasks and do so with pain not > 2-3/10.    Time 4    Period Weeks    Status New      PT LONG TERM GOAL #4   Title Increaseright  knee and hip strength to a solid 4+/5 to provide good stability for accomplishment of functional activities.    Time 4    Period Weeks    Status New      PT LONG TERM GOAL #5   Title Perform a reciprocating stair gait with one railing with pain not > 2-3/10.    Time 4    Period Weeks    Status New                   Plan - 09/03/20 1027     Clinical Impression Statement Patient presented in clinic with continued high level pain with R knee flexion. Patient limited as he continues to experience increased R knee pain and edema.  Patient's aquacell still in place over R knee incision as well as uses FW for ambulation. Some redness noted along edges of aquacell but no excessive heat notable. Patient compliant with icing at home for pain and edema and is now regimented with pain medication. Patient able to tolerate light ROM and stretching but limited by pain. Normal vasopneumatic response noted following removal of the modality.    Personal Factors and Comorbidities Other;Comorbidity 1;Comorbidity 2    Comorbidities CAD, coronary stent, chroinic low back pain, left TSA, right hand surgery, HTN, low back surgery, prior right knee surgery.    Examination-Activity Limitations Other;Transfers;Locomotion Level;Stairs    Examination-Participation Restrictions Other    Stability/Clinical Decision Making Stable/Uncomplicated    Rehab Potential Excellent    PT Frequency 3x / week    PT Duration 4 weeks    PT Treatment/Interventions ADLs/Self Care Home Management;Cryotherapy;Electrical Stimulation;Moist Heat;Functional mobility Arts administrator;Therapeutic activities;Therapeutic exercise;Neuromuscular re-education;Manual techniques;Patient/family education;Passive range of motion;Vasopneumatic Device    PT Next Visit Plan Progress into TKA protocol.  Vasopneumatic.    Consulted and Agree with Plan of Care Patient             Patient will benefit from skilled therapeutic intervention in order to improve the following deficits and impairments:  Pain, Abnormal gait, Difficulty walking, Decreased activity tolerance, Decreased mobility, Decreased range of motion, Decreased strength, Increased edema  Visit Diagnosis: Muscle weakness (generalized)  Chronic pain of right knee  Stiffness of right knee, not elsewhere classified  Localized edema     Problem List Patient Active Problem List   Diagnosis Date Noted   Pain due to unicompartmental arthroplasty of knee ([place]) 08/25/2020   Peripheral arterial  disease ([place]) 11/19/2019   RBBB 01/16/2017   PUD (peptic ulcer disease) 01/16/2017   Melena 01/12/2017   Unstable angina ([place]) 01/11/2017   CAD S/P percutaneous coronary angioplasty 01/09/2017   Mixed hyperlipidemia 01/09/2017   DM type 2 causing vascular disease ([place]) 12/24/2016   Essential hypertension, benign 12/24/2016   Chest pain, rule out acute myocardial infarction 12/24/2016   ACS (acute coronary syndrome) [facility])    History of NSTEMI    Myelopathy ([place]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

## 2020-09-06 ENCOUNTER — Ambulatory Visit: Payer: Medicare Other | Attending: Student | Admitting: Physical Therapy

## 2020-09-06 ENCOUNTER — Other Ambulatory Visit: Payer: Self-pay

## 2020-09-06 DIAGNOSIS — M25561 Pain in right knee: Secondary | ICD-10-CM | POA: Insufficient documentation

## 2020-09-06 DIAGNOSIS — G8929 Other chronic pain: Secondary | ICD-10-CM | POA: Diagnosis not present

## 2020-09-06 DIAGNOSIS — M25661 Stiffness of right knee, not elsewhere classified: Secondary | ICD-10-CM

## 2020-09-06 DIAGNOSIS — M6281 Muscle weakness (generalized): Secondary | ICD-10-CM | POA: Diagnosis not present

## 2020-09-06 DIAGNOSIS — R600 Localized edema: Secondary | ICD-10-CM | POA: Diagnosis not present

## 2020-09-06 NOTE — Therapy (Signed)
[facility] [place], [place], [address] Phone: [phone]   Fax:  [phone]  Physical Therapy Treatment  Patient Details  Name: Matthew [name] MRN: [id number] Date of Birth: [birth year] Referring Provider (PT): Gaynelle Arabian MD   Encounter Date: 09/06/2020   PT End of Session - 09/06/20 1057     Visit Number 4    Date for PT Re-Evaluation 09/27/20    Authorization Type FOTO AT LEAST EVERY 5TH VISIT.  PROGRESS NOTE AT 10TH VISIT.  KX MODIFIER AFTER 15 VISITS.    PT Start Time 0945    PT Stop Time 1040    PT Time Calculation (min) 55 min    Activity Tolerance Patient tolerated treatment well    Behavior During Therapy WFL for tasks assessed/performed             Past Medical History:  Diagnosis Date   Anticoagulated    plavix   Arthritis    Shoulder, knees, back    CAD in native artery cardiologist-  dr Angelena Form   a. CAD/NSTEMI ,  cardiac cath staged stenting-- 12-25-2016  PTCA and DES x3 to prox. and mid LAD;  12-26-2016  PCI to PLA and DES x1 to midRCA,  EF 60-65%.   Chronic lower back pain    CKD (chronic kidney disease), stage III (HCC)    Complication of anesthesia    "Too much with shoulder surgery", pt. reports that he was told the at they "lost him, due to absorbing too much anesthesia".  shoulder surgery 1985   DDD (degenerative disc disease), lumbosacral    GERD (gastroesophageal reflux disease)    History of gastric ulcer 01/2017   History of kidney stones    History of malignant melanoma    right side of nose   History of non-ST elevation myocardial infarction (NSTEMI) 12/23/2017   s/p  staged cardiac cath,  s/p  PCI and DEStenting   Hyperlipidemia    Hypertension    IDA (iron deficiency anemia)    Myocardial infarction ([place])    Nocturia    RBBB    Renal calculus, right    S/P drug eluting coronary stent placement 12-25-2017,  12-26-2017   PTCA and DES x3 to prox. and mid LAD;  PCI to PLA  and DES x1 to midRCA   Type 2 diabetes mellitus treated with insulin ([place])    followed by pcp    Past Surgical History:  Procedure Laterality Date   ANAL FISSURE REPAIR  X 2   CATARACT EXTRACTION W/ INTRAOCULAR LENS  IMPLANT, BILATERAL Bilateral 2017;  2015   COLONOSCOPY WITH PROPOFOL N/A 06/05/2016   Procedure: COLONOSCOPY WITH PROPOFOL;  Surgeon: Garlan Fair, MD;  [facility];  Service: Endoscopy;  Laterality: N/A;   CONVERSION TO TOTAL KNEE Right 08/25/2020   Procedure: Revision right knee unicompartmental arthroplasty to total knee arthroplasty;  Surgeon: Gaynelle Arabian, MD;  [facility]: [facility];  Service: Orthopedics;  Laterality: Right;   CORONARY ANGIOGRAPHY N/A 12/26/2016   Procedure: CORONARY ANGIOGRAPHY;  Surgeon: Troy Sine, MD;  Location: [facility];  Service: Cardiovascular;  Laterality: N/A;   CORONARY STENT INTERVENTION N/A 12/25/2016   Procedure: CORONARY STENT INTERVENTION;  Surgeon: Burnell Blanks, MD;  Location: [place] [facility];  Service: Cardiovascular;  Laterality: N/A;   CORONARY STENT INTERVENTION N/A 12/26/2016   Procedure: CORONARY STENT INTERVENTION;  Surgeon: Troy Sine, MD;  Location: [facility] [facility];  Service: Cardiovascular;  Laterality: N/A;   CYSTOSCOPY/URETEROSCOPY/HOLMIUM LASER/STENT PLACEMENT Right 01/07/2018   Procedure: RIGHT URETEROSCOPY/HOLMIUM LASER/STENT PLACEMENT;  Surgeon: Lucas Mallow, MD;  Location: [facility];  Service: Urology;  Laterality: Right;   ESOPHAGOGASTRODUODENOSCOPY (EGD) WITH PROPOFOL N/A 01/12/2017   Procedure: ESOPHAGOGASTRODUODENOSCOPY (EGD) WITH PROPOFOL;  Surgeon: Wilford Corner, MD;  Location: [place] [facility];  Service: Endoscopy;  Laterality: N/A;   HAND TENDON SURGERY Right 10-29-2002   dr Burney Gauze '@[facility]'$    right index and thumb   KNEE ARTHROSCOPY Bilateral 2009-;2010   '@[facility]'$    LEFT HEART CATH AND CORONARY ANGIOGRAPHY N/A 12/25/2016   Procedure: LEFT HEART  CATH AND CORONARY ANGIOGRAPHY;  Surgeon: Burnell Blanks, MD;  Location: [place];  Service: Cardiovascular;  Laterality: N/A;   LEFT HEART CATH AND CORONARY ANGIOGRAPHY N/A 10/10/2019   Procedure: LEFT HEART CATH AND CORONARY ANGIOGRAPHY;  Surgeon: Troy Sine, MD;  Location: [facility];  Service: Cardiovascular;  Laterality: N/A;   LUMBAR LAMINECTOMY/DECOMPRESSION MICRODISCECTOMY N/A 06/09/2015   Procedure: Laminectomy - T12-L1;  Surgeon: Eustace Moore, MD;  Location: [facility];  Service: Neurosurgery;  Laterality: N/A;  Laminectomy - T12-L1   MEDIAL PARTIAL KNEE REPLACEMENT Bilateral 2009-2010    [facility]    MELANOMA EXCISION Right    "side of my nose"   SHOULDER SURGERY Left 1985   TOTAL SHOULDER ARTHROPLASTY Left 12/06/2012   Procedure: LEFT TOTAL SHOULDER ARTHROPLASTY VERSES A REVERSE TOTAL SHOULDER ARTHROPLASTY;  Surgeon: Augustin Schooling, MD;  Location: [facility];  Service: Orthopedics;  Laterality: Left;    There were no vitals filed for this visit.   Subjective Assessment - 09/06/20 1056     Subjective COVID-19 screen performed prior to patient entering clinic.  Hurting a lot.    Pertinent History CAD, coronary stent, chroinic low back pain, left TSA, right hand surgery, HTN, low back surgery, prior right knee surgery.    Currently in Pain? Yes    Pain Score 8     Pain Location Knee    Pain Orientation Right    Pain Descriptors / Indicators Discomfort    Pain Onset More than a month ago                               [facility] Adult PT Treatment/Exercise - 09/06/20 0001       Exercises   Exercises Knee/Hip      Knee/Hip Exercises: Aerobic   Nustep level 1 x 15 minutes moving seat forward x 2 to increase knee flexion.      Knee/Hip Exercises: Supine   Short Arc Quad Sets Limitations 16 minutes facilitated with Bi-Phasic e'stim to patient's right quads with 10 sec extension holds and 10 sec rest.      Vasopneumatic   Number  Minutes Vasopneumatic  15 minutes    Vasopnuematic Location  --   RT knee.   Vasopneumatic Pressure Low                         PT Long Term Goals - 08/30/20 1301       PT LONG TERM GOAL #1   Title Independent with a HEP.    Time 4    Period Weeks    Status New      PT LONG TERM GOAL #2   Title Full active right knee extension in order to normalize gait.  Time 4    Period Weeks    Status New      PT LONG TERM GOAL #3   Title Active knee flexion to 115 degrees+ so the patient can perform functional tasks and do so with pain not > 2-3/10.    Time 4    Period Weeks    Status New      PT LONG TERM GOAL #4   Title Increaseright  knee and hip strength to a solid 4+/5 to provide good stability for accomplishment of functional activities.    Time 4    Period Weeks    Status New      PT LONG TERM GOAL #5   Title Perform a reciprocating stair gait with one railing with pain not > 2-3/10.    Time 4    Period Weeks    Status New                   Plan - 09/06/20 1104     Clinical Impression Statement Patient c/o a lot of pain.  His wife was present and states he is trying to do his HEP.  He is on the walker and compliant to taking his medication.  He isweraing TED hose and has no c/o right calf pain.  He did very well with SAQ's facilitated with Bi-Phasic e'stim today.    Personal Factors and Comorbidities Other;Comorbidity 1;Comorbidity 2    Comorbidities CAD, coronary stent, chroinic low back pain, left TSA, right hand surgery, HTN, low back surgery, prior right knee surgery.    Examination-Participation Restrictions Other    Stability/Clinical Decision Making Stable/Uncomplicated    Rehab Potential Excellent             Patient will benefit from skilled therapeutic intervention in order to improve the following deficits and impairments:     Visit Diagnosis: Muscle weakness (generalized)  Chronic pain of right knee  Stiffness of right  knee, not elsewhere classified  Localized edema     Problem List Patient Active Problem List   Diagnosis Date Noted   Pain due to unicompartmental arthroplasty of knee ([place]) 08/25/2020   Peripheral arterial disease ([place]) 11/19/2019   RBBB 01/16/2017   PUD (peptic ulcer disease) 01/16/2017   Melena 01/12/2017   Unstable angina ([place]) 01/11/2017   CAD S/P percutaneous coronary angioplasty 01/09/2017   Mixed hyperlipidemia 01/09/2017   DM type 2 causing vascular disease ([place]) 12/24/2016   Essential hypertension, benign 12/24/2016   Chest pain, rule out acute myocardial infarction 12/24/2016   ACS (acute coronary syndrome) 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 [place], [place], [address] Phone: [phone]   Fax:  [phone]  Name: Matthew [name] MRN: [id number] Date of Birth: 30-Jan-1941

## 2020-09-08 ENCOUNTER — Other Ambulatory Visit: Payer: Self-pay

## 2020-09-08 ENCOUNTER — Ambulatory Visit: Payer: Medicare Other | Admitting: Cardiovascular Disease

## 2020-09-08 ENCOUNTER — Encounter: Payer: Self-pay | Admitting: Cardiovascular Disease

## 2020-09-08 ENCOUNTER — Ambulatory Visit: Payer: Medicare Other | Admitting: Physical Therapy

## 2020-09-08 VITALS — BP 150/68 | HR 70 | Ht 68.0 in | Wt 194.2 lb

## 2020-09-08 DIAGNOSIS — I25118 Atherosclerotic heart disease of native coronary artery with other forms of angina pectoris: Secondary | ICD-10-CM

## 2020-09-08 DIAGNOSIS — M6281 Muscle weakness (generalized): Secondary | ICD-10-CM

## 2020-09-08 DIAGNOSIS — R600 Localized edema: Secondary | ICD-10-CM

## 2020-09-08 DIAGNOSIS — G8929 Other chronic pain: Secondary | ICD-10-CM

## 2020-09-08 DIAGNOSIS — E785 Hyperlipidemia, unspecified: Secondary | ICD-10-CM

## 2020-09-08 DIAGNOSIS — M25661 Stiffness of right knee, not elsewhere classified: Secondary | ICD-10-CM

## 2020-09-08 DIAGNOSIS — M25561 Pain in right knee: Secondary | ICD-10-CM | POA: Diagnosis not present

## 2020-09-08 MED ORDER — EZETIMIBE 10 MG PO TABS: 10.0000 mg | ORAL_TABLET | Freq: Every day | ORAL | 3 refills | Status: AC

## 2020-09-08 NOTE — Patient Instructions (Signed)
Medication Instructions:  Your physician has recommended you make the following change in your medication:  1.) ezetimibe (Zetia) 10 mg - take one tablet daily  *If you need a refill on your cardiac medications before your next appointment, please call your pharmacy*   Lab Work: none If you have labs (blood work) drawn today and your tests are completely normal, you will receive your results only by: [place] (if you have MyChart) OR A paper copy in the mail If you have any lab test that is abnormal or we need to change your treatment, we will call you to review the results.   Testing/Procedures: none  Follow-Up: At [facility], you and your health needs are our priority.  As part of our continuing mission to provide you with exceptional heart care, we have created designated Provider Care Teams.  These Care Teams include your primary Cardiologist (physician) and Advanced Practice Providers (APPs -  Physician Assistants and Nurse Practitioners) who all work together to provide you with the care you need, when you need it.   Your next appointment:   12 month(s)  The format for your next appointment:   In Person  Provider:   You may see Lauree Chandler, MD or one of the following Advanced Practice Providers on your designated Care Team:   Melina Copa, PA-C Ermalinda Barrios, PA-C

## 2020-09-08 NOTE — Progress Notes (Signed)
Chief Complaint  Patient presents with   Follow-up    CAD    History of Present Illness: 80 yo male with history of DM, HTN and CAD here today for follow up. He was admitted to [facility] November 2018 with a NSTEMI. Cardiac cath November 2018 with severe stenosis in the mid LAD and mid RCA treated with 3 drug eluting stents in LAD and one drug eluting stent in the mid RCA. The posterolateral branch had a severe stenosis treated with balloon angioplasty. Echo November 2018 with normal LV systolic function. He was placed on ASA and Brilinta but admitted December 2018 with melena and chest pain. EGD showed a non bleeding ulcer and gastritis. ASA was stopped and Brilinta was continued.  He had severe abdominal pains while on a statin and stopped this. He has tolerated Zetia. He was seen in March 2020 by Ermalinda Barrios, PA and had c/o chest pain when walking in cold weather. Imdur was increased. Nuclear stress test 04/15/18 with no ischemia. He was admitted to [facility] September 2021 with chest pain. Cardiac cath 10/10/19 with patent LAD and RCA stents with moderate stenosis in the small Circumflex and moderate stenosis in the PDA. Also severe disease in an RV marginal branc. Normal LV function. Medical therapy recommended.   He is here today for follow up. The patient denies any chest pain, dyspnea, palpitations, lower extremity edema, orthopnea, PND, dizziness, near syncope or syncope. Recent knee replacement.    Primary Care Physician: Wenda Low, MD  Past Medical History:  Diagnosis Date   Anticoagulated    plavix   Arthritis    Shoulder, knees, back    CAD in native artery cardiologist-  dr Angelena Form   a. CAD/NSTEMI ,  cardiac cath staged stenting-- 12-25-2016  PTCA and DES x3 to prox. and mid LAD;  12-26-2016  PCI to PLA and DES x1 to midRCA,  EF 60-65%.   Chronic lower back pain    CKD (chronic kidney disease), stage III (HCC)    Complication of anesthesia    "Too much with shoulder surgery", pt.  reports that he was told the at they "lost him, due to absorbing too much anesthesia".  shoulder surgery 1985   DDD (degenerative disc disease), lumbosacral    GERD (gastroesophageal reflux disease)    History of gastric ulcer 01/2017   History of kidney stones    History of malignant melanoma    right side of nose   History of non-ST elevation myocardial infarction (NSTEMI) 12/23/2017   s/p  staged cardiac cath,  s/p  PCI and DEStenting   Hyperlipidemia    Hypertension    IDA (iron deficiency anemia)    Myocardial infarction ([place])    Nocturia    RBBB    Renal calculus, right    S/P drug eluting coronary stent placement 12-25-2017,  12-26-2017   PTCA and DES x3 to prox. and mid LAD;  PCI to PLA and DES x1 to midRCA   Type 2 diabetes mellitus treated with insulin ([place])    followed by pcp    Past Surgical History:  Procedure Laterality Date   ANAL FISSURE REPAIR  X 2   CATARACT EXTRACTION W/ INTRAOCULAR LENS  IMPLANT, BILATERAL Bilateral 2017;  2015   COLONOSCOPY WITH PROPOFOL N/A 06/05/2016   Procedure: COLONOSCOPY WITH PROPOFOL;  Surgeon: Garlan Fair, MD;  [facility];  Service: Endoscopy;  Laterality: N/A;   CONVERSION TO TOTAL KNEE Right 08/25/2020   Procedure: Revision  right knee unicompartmental arthroplasty to total knee arthroplasty;  Surgeon: Gaynelle Arabian, MD;  [facility];  Service: Orthopedics;  Laterality: Right;   CORONARY ANGIOGRAPHY N/A 12/26/2016   Procedure: CORONARY ANGIOGRAPHY;  Surgeon: Troy Sine, MD;  Location: [place] [facility];  Service: Cardiovascular;  Laterality: N/A;   CORONARY STENT INTERVENTION N/A 12/25/2016   Procedure: CORONARY STENT INTERVENTION;  Surgeon: Burnell Blanks, MD;  Location: [place] [facility];  Service: Cardiovascular;  Laterality: N/A;   CORONARY STENT INTERVENTION N/A 12/26/2016   Procedure: CORONARY STENT INTERVENTION;  Surgeon: Troy Sine, MD;  Location: [place] [facility];  Service:  Cardiovascular;  Laterality: N/A;   CYSTOSCOPY/URETEROSCOPY/HOLMIUM LASER/STENT PLACEMENT Right 01/07/2018   Procedure: RIGHT URETEROSCOPY/HOLMIUM LASER/STENT PLACEMENT;  Surgeon: Lucas Mallow, MD;  Location: [facility];  Service: Urology;  Laterality: Right;   ESOPHAGOGASTRODUODENOSCOPY (EGD) WITH PROPOFOL N/A 01/12/2017   Procedure: ESOPHAGOGASTRODUODENOSCOPY (EGD) WITH PROPOFOL;  Surgeon: Wilford Corner, MD;  Location: [facility];  Service: Endoscopy;  Laterality: N/A;   HAND TENDON SURGERY Right 10-29-2002   dr Burney Gauze '@[facility]'$    right index and thumb   KNEE ARTHROSCOPY Bilateral 2009-;2010   '@[facility]    LEFT HEART CATH AND CORONARY ANGIOGRAPHY N/A 12/25/2016   Procedure: LEFT HEART CATH AND CORONARY ANGIOGRAPHY;  Surgeon: Burnell Blanks, MD;  Location: [facility] [facility];  Service: Cardiovascular;  Laterality: N/A;   LEFT HEART CATH AND CORONARY ANGIOGRAPHY N/A 10/10/2019   Procedure: LEFT HEART CATH AND CORONARY ANGIOGRAPHY;  Surgeon: Troy Sine, MD;  Location: [facility];  Service: Cardiovascular;  Laterality: N/A;   LUMBAR LAMINECTOMY/DECOMPRESSION MICRODISCECTOMY N/A 06/09/2015   Procedure: Laminectomy - T12-L1;  Surgeon: Eustace Moore, MD;  Location: [facility];  Service: Neurosurgery;  Laterality: N/A;  Laminectomy - T12-L1   MEDIAL PARTIAL KNEE REPLACEMENT Bilateral 2009-2010    [facility]    MELANOMA EXCISION Right    "side of my nose"   SHOULDER SURGERY Left 1985   TOTAL SHOULDER ARTHROPLASTY Left 12/06/2012   Procedure: LEFT TOTAL SHOULDER ARTHROPLASTY VERSES A REVERSE TOTAL SHOULDER ARTHROPLASTY;  Surgeon: Augustin Schooling, MD;  Location: [facility];  Service: Orthopedics;  Laterality: Left;    Current Outpatient Medications  Medication Sig Dispense Refill   acetaminophen (TYLENOL) 325 MG tablet Take 2 tablets (650 mg total) by mouth every 6 (six) hours as needed for mild pain or headache.     Alogliptin Benzoate 12.5 MG TABS Take 12.5 mg by  mouth daily. 90 tablet 1   amLODipine (NORVASC) 10 MG tablet Take 5 mg by mouth in the morning and at bedtime.     amoxicillin (AMOXIL) 500 MG capsule Take 2,000 mg by mouth See admin instructions. Take 2000 mg by mouth 1 hour prior to dental work     clopidogrel (PLAVIX) 75 MG tablet Take 1 tablet (75 mg total) by mouth daily. 90 tablet 3   Continuous Blood Gluc Receiver (FREESTYLE LIBRE 2 READER) DEVI As directed 1 each 0   Continuous Blood Gluc Sensor (FREESTYLE LIBRE 2 SENSOR) MISC 1 Piece by Does not apply route every 14 (fourteen) days. 2 each 3   ezetimibe (ZETIA) 10 MG tablet Take 1 tablet (10 mg total) by mouth daily. 90 tablet 3   HYDROmorphone (DILAUDID) 2 MG tablet Take 1 - 2 tablets (2 - 4 mg total) by mouth every 6 hours as needed for severe pain. 42 tablet 0   insulin aspart protamine - aspart (  NOVOLOG 70/30 FLEXPEN RELION) (70-30) 100 UNIT/ML FlexPen Inject 10 Units into the skin 2 (two) times daily before a meal.     iron polysaccharides (NIFEREX) 150 MG capsule Take 150 mg by mouth daily.     isosorbide mononitrate (IMDUR) 60 MG 24 hr tablet Take 1.5 tablets (90 mg total) by mouth daily. 120 tablet 3   losartan (COZAAR) 50 MG tablet Take 1 tablet (50 mg total) by mouth daily. 90 tablet 1   metFORMIN (GLUCOPHAGE XR) 500 MG 24 hr tablet Take 1 tablet (500 mg total) by mouth daily with breakfast. 90 tablet 3   methocarbamol (ROBAXIN) 500 MG tablet Take 1 tablet (500 mg total) by mouth every 6 (six) hours as needed for muscle spasms. 40 tablet 0   metoprolol tartrate (LOPRESSOR) 25 MG tablet TAKE 1 TABLET BY MOUTH TWICE A DAY 180 tablet 1   nitroGLYCERIN (NITROSTAT) 0.4 MG SL tablet Place 1 tablet (0.4 mg total) under the tongue every 5 (five) minutes as needed for chest pain. 30 tablet 12   Omega-3 Fatty Acids (FISH OIL) 1000 MG CAPS Take 1,000 mg by mouth daily.     pantoprazole (PROTONIX) 40 MG tablet Take 40 mg by mouth daily.     traMADol (ULTRAM) 50 MG tablet Take 1 - 2 tablets  (50-100 mg) by mouth every 6 hours as needed for moderate pain. 40 tablet 0   TURMERIC PO Take 1,500 mg by mouth 2 (two) times daily.     Vitamin D, Cholecalciferol, 25 MCG (1000 UT) CAPS Take 1,000 Units by mouth daily.      Zinc 50 MG CAPS Take 50 mg by mouth daily.     No current facility-administered medications for this visit.    Allergies  Allergen Reactions   Statins Other (See Comments)    Severe stomach pain.   Oxycodone Itching   Hydrocodone Other (See Comments)    "messes with my hearing"   Codeine Itching   Morphine And Related Itching    Social History   Socioeconomic History   Marital status: Married    Spouse name: Not on file   Number of children: Not on file   Years of education: Not on file   Highest education level: Not on file  Occupational History   Not on file  Tobacco Use   Smoking status: Never   Smokeless tobacco: Never  Vaping Use   Vaping Use: Never used  Substance and Sexual Activity   Alcohol use: No   Drug use: No   Sexual activity: Yes  Other Topics Concern   Not on file  Social History Narrative   Not on file   Social Determinants of Health   Financial Resource Strain: Not on file  Food Insecurity: Not on file  Transportation Needs: Not on file  Physical Activity: Not on file  Stress: Not on file  Social Connections: Not on file  Intimate Partner Violence: Not on file    Family History  Problem Relation Age of Onset   CAD Father    Heart failure Father    CAD Sister    Stroke Mother    Heart failure Mother     Review of Systems:  As stated in the HPI and otherwise negative.   BP (!) 150/68   Pulse 70   Ht '5\' 8"'$  (1.727 m)   Wt 194 lb 3.2 oz (88.1 kg)   SpO2 97%   BMI 29.53 kg/m   Physical Examination:  General:  Well developed, well nourished, NAD  HEENT: OP clear, mucus membranes moist  SKIN: warm, dry. No rashes. Neuro: No focal deficits  Musculoskeletal: Muscle strength 5/5 all ext  Psychiatric: Mood and  affect normal  Neck: No JVD, no carotid bruits, no thyromegaly, no lymphadenopathy.  Lungs:Clear bilaterally, no wheezes, rhonci, crackles Cardiovascular: Regular rate and rhythm. No murmurs, gallops or rubs. Abdomen:Soft. Bowel sounds present. Non-tender.  Extremities: No lower extremity edema. Pulses are 2 + in the bilateral DP/PT.  Echo 12/26/16: - Left ventricle: The cavity size was normal. Wall thickness was   normal. Systolic function was normal. The estimated ejection   fraction was in the range of 60% to 65%. Wall motion was normal;   there were no regional wall motion abnormalities. Left   ventricular diastolic function parameters were normal. - Mitral valve: Calcified annulus. - Atrial septum: No defect or patent foramen ovale was identified. - Pulmonary arteries: PA peak pressure: 36 mm Hg (S).  EKG:  EKG is not ordered today The ekg ordered today demonstrates   Recent Labs: 08/17/2020: ALT 16 08/26/2020: BUN 16; Creatinine, Ser 1.09; Hemoglobin 11.3; Platelets 171; Potassium 4.9; Sodium 135   Lipid Panel    Component Value Date/Time   CHOL 143 01/22/2018 1257   TRIG 213 (H) 01/22/2018 1257   HDL 32 (L) 01/22/2018 1257   CHOLHDL 4.5 01/22/2018 1257   CHOLHDL 4.4 12/24/2016 0634   VLDL 13 12/24/2016 0634   LDLCALC 68 01/22/2018 1257     Wt Readings from Last 3 Encounters:  09/08/20 194 lb 3.2 oz (88.1 kg)  08/25/20 199 lb (90.3 kg)  08/17/20 199 lb (90.3 kg)     Other studies Reviewed: Additional studies/ records that were reviewed today include: . Review of the above records demonstrates:    Assessment and Plan:   1. CAD with angina: No chest pain suggestive of angina on Imdur. Drug eluting stents placed in the LAD and RCA in November 2018 and CAD stable by cath in September 2021. He has normal LV systolic function. Statin stopped due to abdominal pain. Continue Plavix and beta blocker. Restart Zetia.   2. Hyperlipidemia: He does not tolerate statins. He  had been on Zetia and is not sure why this was stopped. Will restart Zetia 10 mg daily. Labs followed at the [place]. Goal LDL is 70. Consider Repatha if LDL not at goal after several months of Zetia.   Current medicines are reviewed at length with the patient today.  The patient does not have concerns regarding medicines.  The following changes have been made:  no change  Labs/ tests ordered today include:   No orders of the defined types were placed in this encounter.    Disposition:   F/U with me in 12 months   Signed, Lauree Chandler, MD 09/08/2020 12:42 PM    [facility] [place], [place], [place]  [address] Phone: [phone]; Fax: [phone]

## 2020-09-08 NOTE — Therapy (Signed)
[facility] [place], [place], [address] Phone: [phone]   Fax:  [phone]  Physical Therapy Treatment  Patient Details  Name: Matthew [name] MRN: [id number] Date of Birth: Jun 13, 1940 Referring Provider (PT): Gaynelle Arabian MD   Encounter Date: 09/08/2020   PT End of Session - 09/08/20 1023     Visit Number 5    Number of Visits 12    Date for PT Re-Evaluation 09/27/20    Authorization Type FOTO AT LEAST EVERY 5TH VISIT.  PROGRESS NOTE AT 10TH VISIT.  KX MODIFIER AFTER 15 VISITS.    PT Start Time [phone]    PT Stop Time 1032    PT Time Calculation (min) 49 min    Activity Tolerance Patient tolerated treatment well    Behavior During Therapy WFL for tasks assessed/performed             Past Medical History:  Diagnosis Date   Anticoagulated    plavix   Arthritis    Shoulder, knees, back    CAD in native artery cardiologist-  dr Angelena Form   a. CAD/NSTEMI ,  cardiac cath staged stenting-- 12-25-2016  PTCA and DES x3 to prox. and mid LAD;  12-26-2016  PCI to PLA and DES x1 to midRCA,  EF 60-65%.   Chronic lower back pain    CKD (chronic kidney disease), stage III (HCC)    Complication of anesthesia    "Too much with shoulder surgery", pt. reports that he was told the at they "lost him, due to absorbing too much anesthesia".  shoulder surgery 1985   DDD (degenerative disc disease), lumbosacral    GERD (gastroesophageal reflux disease)    History of gastric ulcer 01/2017   History of kidney stones    History of malignant melanoma    right side of nose   History of non-ST elevation myocardial infarction (NSTEMI) 12/23/2017   s/p  staged cardiac cath,  s/p  PCI and DEStenting   Hyperlipidemia    Hypertension    IDA (iron deficiency anemia)    Myocardial infarction ([place])    Nocturia    RBBB    Renal calculus, right    S/P drug eluting coronary stent placement 12-25-2017,  12-26-2017   PTCA and DES x3 to prox.  and mid LAD;  PCI to PLA and DES x1 to midRCA   Type 2 diabetes mellitus treated with insulin ([place])    followed by pcp    Past Surgical History:  Procedure Laterality Date   ANAL FISSURE REPAIR  X 2   CATARACT EXTRACTION W/ INTRAOCULAR LENS  IMPLANT, BILATERAL Bilateral 2017;  2015   COLONOSCOPY WITH PROPOFOL N/A 06/05/2016   Procedure: COLONOSCOPY WITH PROPOFOL;  Surgeon: Garlan Fair, MD;  [facility];  Service: Endoscopy;  Laterality: N/A;   CONVERSION TO TOTAL KNEE Right 08/25/2020   Procedure: Revision right knee unicompartmental arthroplasty to total knee arthroplasty;  Surgeon: Gaynelle Arabian, MD;  [facility];  Service: Orthopedics;  Laterality: Right;   CORONARY ANGIOGRAPHY N/A 12/26/2016   Procedure: CORONARY ANGIOGRAPHY;  Surgeon: Troy Sine, MD;  Location: [place] [facility];  Service: Cardiovascular;  Laterality: N/A;   CORONARY STENT INTERVENTION N/A 12/25/2016   Procedure: CORONARY STENT INTERVENTION;  Surgeon: Burnell Blanks, MD;  Location: [place] [facility];  Service: Cardiovascular;  Laterality: N/A;   CORONARY STENT INTERVENTION N/A 12/26/2016   Procedure: CORONARY STENT INTERVENTION;  Surgeon: Troy Sine, MD;  Location:  [place] [facility];  Service: Cardiovascular;  Laterality: N/A;   CYSTOSCOPY/URETEROSCOPY/HOLMIUM LASER/STENT PLACEMENT Right 01/07/2018   Procedure: RIGHT URETEROSCOPY/HOLMIUM LASER/STENT PLACEMENT;  Surgeon: Lucas Mallow, MD;  Location: [facility];  Service: Urology;  Laterality: Right;   ESOPHAGOGASTRODUODENOSCOPY (EGD) WITH PROPOFOL N/A 01/12/2017   Procedure: ESOPHAGOGASTRODUODENOSCOPY (EGD) WITH PROPOFOL;  Surgeon: Wilford Corner, MD;  Location: [place];  Service: Endoscopy;  Laterality: N/A;   HAND TENDON SURGERY Right 10-29-2002   dr Burney Gauze '@[facility]'$    right index and thumb   KNEE ARTHROSCOPY Bilateral 2009-;2010   '@[facility]'$    LEFT HEART CATH AND CORONARY ANGIOGRAPHY N/A 12/25/2016    Procedure: LEFT HEART CATH AND CORONARY ANGIOGRAPHY;  Surgeon: Burnell Blanks, MD;  Location: [place] [facility];  Service: Cardiovascular;  Laterality: N/A;   LEFT HEART CATH AND CORONARY ANGIOGRAPHY N/A 10/10/2019   Procedure: LEFT HEART CATH AND CORONARY ANGIOGRAPHY;  Surgeon: Troy Sine, MD;  Location: [place] [facility];  Service: Cardiovascular;  Laterality: N/A;   LUMBAR LAMINECTOMY/DECOMPRESSION MICRODISCECTOMY N/A 06/09/2015   Procedure: Laminectomy - T12-L1;  Surgeon: Eustace Moore, MD;  Location: [place] [facility];  Service: Neurosurgery;  Laterality: N/A;  Laminectomy - T12-L1   MEDIAL PARTIAL KNEE REPLACEMENT Bilateral 2009-2010    [facility]    MELANOMA EXCISION Right    "side of my nose"   SHOULDER SURGERY Left 1985   TOTAL SHOULDER ARTHROPLASTY Left 12/06/2012   Procedure: LEFT TOTAL SHOULDER ARTHROPLASTY VERSES A REVERSE TOTAL SHOULDER ARTHROPLASTY;  Surgeon: Augustin Schooling, MD;  Location: [place];  Service: Orthopedics;  Laterality: Left;    There were no vitals filed for this visit.   Subjective Assessment - 09/08/20 0950     Subjective COVID-19 screen performed prior to patient entering clinic.  Last treatment helped a lot yet ongoing pain today, went to [facility] and is to really work on bending knee    Patient is accompained by: Family member    Pertinent History CAD, coronary stent, chroinic low back pain, left TSA, right hand surgery, HTN, low back surgery, prior right knee surgery.    How long can you walk comfortably? Around home with a FWW.    Patient Stated Goals Get around without pain.    Currently in Pain? Yes    Pain Score 9     Pain Location Knee    Pain Orientation Right    Pain Descriptors / Indicators Discomfort    Pain Type Surgical pain    Pain Onset More than a month ago    Pain Frequency Intermittent    Aggravating Factors  movement of knee    Pain Relieving Factors rest / ice                [facility] PT Assessment - 09/08/20 0001       ROM /  Strength   AROM / PROM / Strength AROM;PROM      AROM   AROM Assessment Site Knee    Right/Left Knee Right    Right Knee Extension -15    Right Knee Flexion 74      PROM   PROM Assessment Site Knee    Right/Left Knee Right    Right Knee Extension -10    Right Knee Flexion 80                           [facility] Adult PT Treatment/Exercise - 09/08/20 0001  Knee/Hip Exercises: Aerobic   Nustep L1 x3mn UE/LE activity      Knee/Hip Exercises: Supine   Short Arc Quad Sets Limitations Bi-phasic 10/10 x827m with SAQ for activation      Vasopneumatic   Number Minutes Vasopneumatic  15 minutes    Vasopnuematic Location  Knee    Vasopneumatic Pressure Low    Vasopneumatic Temperature  34 for pain /edema      Manual Therapy   Manual Therapy Passive ROM    Passive ROM manual PROM for right knee flexion and ext with low load holds to improve mobility                         PT Long Term Goals - 09/08/20 1026       PT LONG TERM GOAL #1   Title Independent with a HEP.    Time 4    Period Weeks    Status On-going      PT LONG TERM GOAL #2   Title Full active right knee extension in order to normalize gait.    Baseline AROM -15 degrees PROM -10 degrees 09/08/20    Time 4    Period Weeks    Status On-going      PT LONG TERM GOAL #3   Title Active knee flexion to 115 degrees+ so the patient can perform functional tasks and do so with pain not > 2-3/10.    Baseline AROM 74 degrees PROM 80 degrees 09/08/20    Time 4    Period Weeks    Status On-going      PT LONG TERM GOAL #4   Title Increaseright  knee and hip strength to a solid 4+/5 to provide good stability for accomplishment of functional activities.    Time 4    Period Weeks    Status On-going      PT LONG TERM GOAL #5   Title Perform a reciprocating stair gait with one railing with pain not > 2-3/10.    Time 4    Period Weeks    Status On-going                   Plan -  09/08/20 1028     Clinical Impression Statement Patient tolerated treatment fair due to pain today. Patient reported more pain from a long drive in car which he will have to do today again. Today focused on ROM to improve mobility and ended with VASO to decrease pain and edama. Patient ROM improved some after manual stretching. Stiff today with both flexion and ext. Goals progressing. Need to leave early for other Dr. appt. today.    Personal Factors and Comorbidities Other;Comorbidity 1;Comorbidity 2    Comorbidities CAD, coronary stent, chroinic low back pain, left TSA, right hand surgery, HTN, low back surgery, prior right knee surgery.    Examination-Activity Limitations Other;Transfers;Locomotion Level;Stairs    Examination-Participation Restrictions Other    Stability/Clinical Decision Making Stable/Uncomplicated    Rehab Potential Excellent    PT Frequency 3x / week    PT Duration 4 weeks    PT Treatment/Interventions ADLs/Self Care Home Management;Cryotherapy;Electrical Stimulation;Moist Heat;Functional mobility trArts administratorherapeutic activities;Therapeutic exercise;Neuromuscular re-education;Manual techniques;Patient/family education;Passive range of motion;Vasopneumatic Device    PT Next Visit Plan Progress into TKA protocol.  Vasopneumatic.    Consulted and Agree with Plan of Care Patient;Family member/caregiver             Patient will benefit from  skilled therapeutic intervention in order to improve the following deficits and impairments:  Pain, Abnormal gait, Difficulty walking, Decreased activity tolerance, Decreased mobility, Decreased range of motion, Decreased strength, Increased edema  Visit Diagnosis: Muscle weakness (generalized)  Chronic pain of right knee  Stiffness of right knee, not elsewhere classified  Localized edema     Problem List Patient Active Problem List   Diagnosis Date Noted   Pain due to unicompartmental  arthroplasty of knee ([place]) 08/25/2020   Peripheral arterial disease ([place]) 11/19/2019   RBBB 01/16/2017   PUD (peptic ulcer disease) 01/16/2017   Melena 01/12/2017   Unstable angina ([place]) 01/11/2017   CAD S/P percutaneous coronary angioplasty 01/09/2017   Mixed hyperlipidemia 01/09/2017   DM type 2 causing vascular disease ([place]) 12/24/2016   Essential hypertension, benign 12/24/2016   Chest pain, rule out acute myocardial infarction 12/24/2016   ACS (acute coronary syndrome) [facility])    History of NSTEMI    Myelopathy ([place]) 06/09/2015    Bryauna Byrum P, PTA 09/08/2020, 10:39 AM  [facility] [place], [place], [address] Phone: [phone]   Fax:  [phone]  Name: KASAI BROWDER MRN: [id number] Date of Birth: [birth year]

## 2020-09-08 NOTE — Therapy (Signed)
[facility] [place], [place], [address] Phone: [phone]   Fax:  [phone]  Physical Therapy Treatment  Patient Details  Name: JAVALE ATHANS MRN: [id number] Date of Birth: 1941-01-23 Referring Provider (PT): Gaynelle Arabian MD   Encounter Date: 09/08/2020   PT End of Session - 09/08/20 1023     Visit Number 5    Number of Visits 12    Date for PT Re-Evaluation 09/27/20    Authorization Type FOTO AT LEAST EVERY 5TH VISIT.  PROGRESS NOTE AT 10TH VISIT.  KX MODIFIER AFTER 15 VISITS.    PT Start Time [phone]    PT Stop Time 1032    PT Time Calculation (min) 49 min    Activity Tolerance Patient tolerated treatment well    Behavior During Therapy WFL for tasks assessed/performed             Past Medical History:  Diagnosis Date   Anticoagulated    plavix   Arthritis    Shoulder, knees, back    CAD in native artery cardiologist-  dr Angelena Form   a. CAD/NSTEMI ,  cardiac cath staged stenting-- 12-25-2016  PTCA and DES x3 to prox. and mid LAD;  12-26-2016  PCI to PLA and DES x1 to midRCA,  EF 60-65%.   Chronic lower back pain    CKD (chronic kidney disease), stage III (HCC)    Complication of anesthesia    "Too much with shoulder surgery", pt. reports that he was told the at they "lost him, due to absorbing too much anesthesia".  shoulder surgery 1985   DDD (degenerative disc disease), lumbosacral    GERD (gastroesophageal reflux disease)    History of gastric ulcer 01/2017   History of kidney stones    History of malignant melanoma    right side of nose   History of non-ST elevation myocardial infarction (NSTEMI) 12/23/2017   s/p  staged cardiac cath,  s/p  PCI and DEStenting   Hyperlipidemia    Hypertension    IDA (iron deficiency anemia)    Myocardial infarction ([place])    Nocturia    RBBB    Renal calculus, right    S/P drug eluting coronary stent placement 12-25-2017,  12-26-2017   PTCA and DES x3 to prox.  and mid LAD;  PCI to PLA and DES x1 to midRCA   Type 2 diabetes mellitus treated with insulin ([place])    followed by pcp    Past Surgical History:  Procedure Laterality Date   ANAL FISSURE REPAIR  X 2   CATARACT EXTRACTION W/ INTRAOCULAR LENS  IMPLANT, BILATERAL Bilateral 2017;  2015   COLONOSCOPY WITH PROPOFOL N/A 06/05/2016   Procedure: COLONOSCOPY WITH PROPOFOL;  Surgeon: Garlan Fair, MD;  [facility];  Service: Endoscopy;  Laterality: N/A;   CONVERSION TO TOTAL KNEE Right 08/25/2020   Procedure: Revision right knee unicompartmental arthroplasty to total knee arthroplasty;  Surgeon: Gaynelle Arabian, MD;  [facility];  Service: Orthopedics;  Laterality: Right;   CORONARY ANGIOGRAPHY N/A 12/26/2016   Procedure: CORONARY ANGIOGRAPHY;  Surgeon: Troy Sine, MD;  Location: [place] [facility];  Service: Cardiovascular;  Laterality: N/A;   CORONARY STENT INTERVENTION N/A 12/25/2016   Procedure: CORONARY STENT INTERVENTION;  Surgeon: Burnell Blanks, MD;  Location: [place] [facility];  Service: Cardiovascular;  Laterality: N/A;   CORONARY STENT INTERVENTION N/A 12/26/2016   Procedure: CORONARY STENT INTERVENTION;  Surgeon: Troy Sine, MD;  Location:  [place] [facility];  Service: Cardiovascular;  Laterality: N/A;   CYSTOSCOPY/URETEROSCOPY/HOLMIUM LASER/STENT PLACEMENT Right 01/07/2018   Procedure: RIGHT URETEROSCOPY/HOLMIUM LASER/STENT PLACEMENT;  Surgeon: Lucas Mallow, MD;  Location: [facility];  Service: Urology;  Laterality: Right;   ESOPHAGOGASTRODUODENOSCOPY (EGD) WITH PROPOFOL N/A 01/12/2017   Procedure: ESOPHAGOGASTRODUODENOSCOPY (EGD) WITH PROPOFOL;  Surgeon: Wilford Corner, MD;  Location: [place];  Service: Endoscopy;  Laterality: N/A;   HAND TENDON SURGERY Right 10-29-2002   dr Burney Gauze '@[facility]'$    right index and thumb   KNEE ARTHROSCOPY Bilateral 2009-;2010   '@[facility]'$    LEFT HEART CATH AND CORONARY ANGIOGRAPHY N/A 12/25/2016    Procedure: LEFT HEART CATH AND CORONARY ANGIOGRAPHY;  Surgeon: Burnell Blanks, MD;  Location: [place] [facility];  Service: Cardiovascular;  Laterality: N/A;   LEFT HEART CATH AND CORONARY ANGIOGRAPHY N/A 10/10/2019   Procedure: LEFT HEART CATH AND CORONARY ANGIOGRAPHY;  Surgeon: Troy Sine, MD;  Location: [place] [facility];  Service: Cardiovascular;  Laterality: N/A;   LUMBAR LAMINECTOMY/DECOMPRESSION MICRODISCECTOMY N/A 06/09/2015   Procedure: Laminectomy - T12-L1;  Surgeon: Eustace Moore, MD;  Location: [place] [facility];  Service: Neurosurgery;  Laterality: N/A;  Laminectomy - T12-L1   MEDIAL PARTIAL KNEE REPLACEMENT Bilateral 2009-2010    [facility]    MELANOMA EXCISION Right    "side of my nose"   SHOULDER SURGERY Left 1985   TOTAL SHOULDER ARTHROPLASTY Left 12/06/2012   Procedure: LEFT TOTAL SHOULDER ARTHROPLASTY VERSES A REVERSE TOTAL SHOULDER ARTHROPLASTY;  Surgeon: Augustin Schooling, MD;  Location: [place];  Service: Orthopedics;  Laterality: Left;    There were no vitals filed for this visit.   Subjective Assessment - 09/08/20 0950     Subjective COVID-19 screen performed prior to patient entering clinic.  Last treatment helped a lot yet ongoing pain today, went to [facility] and is to really work on bending knee    Pertinent History CAD, coronary stent, chroinic low back pain, left TSA, right hand surgery, HTN, low back surgery, prior right knee surgery.    How long can you walk comfortably? Around home with a FWW.    Patient Stated Goals Get around without pain.    Currently in Pain? Yes    Pain Score 9     Pain Location Knee    Pain Orientation Right    Pain Descriptors / Indicators Discomfort    Pain Type Surgical pain    Pain Onset More than a month ago    Pain Frequency Intermittent    Aggravating Factors  movement of knee    Pain Relieving Factors rest / ice                [facility] PT Assessment - 09/08/20 0001       ROM / Strength   AROM / PROM / Strength AROM;PROM       AROM   AROM Assessment Site Knee    Right/Left Knee Right    Right Knee Extension -15    Right Knee Flexion 74      PROM   PROM Assessment Site Knee    Right/Left Knee Right    Right Knee Extension -10    Right Knee Flexion 80                           [facility] Adult PT Treatment/Exercise - 09/08/20 0001       Knee/Hip Exercises: Aerobic   Nustep  L1 x38mn UE/LE activity      Knee/Hip Exercises: Supine   Short Arc Quad Sets Limitations Bi-phasic 10/10 x855m with SAQ for activation      Vasopneumatic   Number Minutes Vasopneumatic  15 minutes    Vasopnuematic Location  Knee    Vasopneumatic Pressure Low    Vasopneumatic Temperature  34 for pain /edema      Manual Therapy   Manual Therapy Passive ROM    Passive ROM manual PROM for right knee flexion and ext with low load holds to improve mobility                         PT Long Term Goals - 09/08/20 1026       PT LONG TERM GOAL #1   Title Independent with a HEP.    Time 4    Period Weeks    Status On-going      PT LONG TERM GOAL #2   Title Full active right knee extension in order to normalize gait.    Baseline AROM -15 degrees PROM -10 degrees 09/08/20    Time 4    Period Weeks    Status On-going      PT LONG TERM GOAL #3   Title Active knee flexion to 115 degrees+ so the patient can perform functional tasks and do so with pain not > 2-3/10.    Baseline AROM 74 degrees PROM 80 degrees 09/08/20    Time 4    Period Weeks    Status On-going      PT LONG TERM GOAL #4   Title Increaseright  knee and hip strength to a solid 4+/5 to provide good stability for accomplishment of functional activities.    Time 4    Period Weeks    Status On-going      PT LONG TERM GOAL #5   Title Perform a reciprocating stair gait with one railing with pain not > 2-3/10.    Time 4    Period Weeks    Status On-going                   Plan - 09/08/20 1028     Clinical Impression  Statement Patient tolerated treatment fair due to pain today. Patient reported more pain from a long drive in car which he will have to do today again. Today focused on ROM to improve mobility and ended with VASO to decrease pain and edama. Patient ROM improved some after manual stretching. Stiff today with both flexion and ext. Goals progressing.    Personal Factors and Comorbidities Other;Comorbidity 1;Comorbidity 2    Comorbidities CAD, coronary stent, chroinic low back pain, left TSA, right hand surgery, HTN, low back surgery, prior right knee surgery.    Examination-Activity Limitations Other;Transfers;Locomotion Level;Stairs    Examination-Participation Restrictions Other    Stability/Clinical Decision Making Stable/Uncomplicated    Rehab Potential Excellent    PT Frequency 3x / week    PT Duration 4 weeks    PT Treatment/Interventions ADLs/Self Care Home Management;Cryotherapy;Electrical Stimulation;Moist Heat;Functional mobility trArts administratorherapeutic activities;Therapeutic exercise;Neuromuscular re-education;Manual techniques;Patient/family education;Passive range of motion;Vasopneumatic Device    PT Next Visit Plan Progress into TKA protocol.  Vasopneumatic.    Consulted and Agree with Plan of Care Patient             Patient will benefit from skilled therapeutic intervention in order to improve the following deficits and impairments:  Pain, Abnormal gait,  Difficulty walking, Decreased activity tolerance, Decreased mobility, Decreased range of motion, Decreased strength, Increased edema  Visit Diagnosis: Muscle weakness (generalized)  Chronic pain of right knee  Stiffness of right knee, not elsewhere classified  Localized edema     Problem List Patient Active Problem List   Diagnosis Date Noted   Pain due to unicompartmental arthroplasty of knee ([place]) 08/25/2020   Peripheral arterial disease ([place]) 11/19/2019   RBBB 01/16/2017   PUD (peptic  ulcer disease) 01/16/2017   Melena 01/12/2017   Unstable angina ([place]) 01/11/2017   CAD S/P percutaneous coronary angioplasty 01/09/2017   Mixed hyperlipidemia 01/09/2017   DM type 2 causing vascular disease ([place]) 12/24/2016   Essential hypertension, benign 12/24/2016   Chest pain, rule out acute myocardial infarction 12/24/2016   ACS (acute coronary syndrome) [facility])    History of NSTEMI    Myelopathy ([place]) 06/09/2015    Yousif Edelson P 09/08/2020, 10:37 AM  [facility] [place], [place], [address] Phone: [phone]   Fax:  [phone]  Name: BRAXTEN FORBUSH MRN: [id number] Date of Birth: March 12, 1940

## 2020-09-08 NOTE — Discharge Summary (Signed)
Physician Discharge Summary   Patient ID: SHRITAN BLATCHFORD MRN: [id number] DOB/AGE: [birth year] 78 y.o.  Admit date: 08/25/2020 Discharge date: 08/26/2020  Primary Diagnosis: s/p Right TKA  Admission Diagnoses:  Past Medical History:  Diagnosis Date   Anticoagulated    plavix   Arthritis    Shoulder, knees, back    CAD in native artery cardiologist-  dr Angelena Form   a. CAD/NSTEMI ,  cardiac cath staged stenting-- 12-25-2016  PTCA and DES x3 to prox. and mid LAD;  12-26-2016  PCI to PLA and DES x1 to midRCA,  EF 60-65%.   Chronic lower back pain    CKD (chronic kidney disease), stage III (HCC)    Complication of anesthesia    "Too much with shoulder surgery", pt. reports that he was told the at they "lost him, due to absorbing too much anesthesia".  shoulder surgery 1985   DDD (degenerative disc disease), lumbosacral    GERD (gastroesophageal reflux disease)    History of gastric ulcer 01/2017   History of kidney stones    History of malignant melanoma    right side of nose   History of non-ST elevation myocardial infarction (NSTEMI) 12/23/2017   s/p  staged cardiac cath,  s/p  PCI and DEStenting   Hyperlipidemia    Hypertension    IDA (iron deficiency anemia)    Myocardial infarction ([place])    Nocturia    RBBB    Renal calculus, right    S/P drug eluting coronary stent placement 12-25-2017,  12-26-2017   PTCA and DES x3 to prox. and mid LAD;  PCI to PLA and DES x1 to midRCA   Type 2 diabetes mellitus treated with insulin ([place])    followed by pcp   Discharge Diagnoses:   Principal Problem:   Pain due to unicompartmental arthroplasty of knee ([place])  Estimated body mass index is 30.26 kg/m as calculated from the following:   Height as of this encounter: '5\' 8"'$  (1.727 m).   Weight as of this encounter: 90.3 kg.  Procedure:  Procedure(s) (LRB): Revision right knee unicompartmental arthroplasty to total knee arthroplasty (Right)   Consults: None  HPI: The patient is a  80 year old male, had a medial unicompartmental right knee performed over 10 years ago.  He has had progressively worsening pain and dysfunction.  He has gone on to develop significant bone on bone changes in the lateral and patellofemoral compartments as well as probable loosening of medial components.  He presents now for revision of a unicompartmental replacement to a total knee arthroplasty.  Laboratory Data: Admission on 08/25/2020, Discharged on 08/26/2020  Component Date Value Ref Range Status   Glucose-Capillary 08/25/2020 176 (A) 70 - 99 mg/dL Final   Glucose reference range applies only to samples taken after fasting for at least 8 hours.   Glucose-Capillary 08/25/2020 215 (A) 70 - 99 mg/dL Final   Glucose reference range applies only to samples taken after fasting for at least 8 hours.   WBC 08/26/2020 8.7  4.0 - 10.5 K/uL Final   RBC 08/26/2020 3.72 (A) 4.22 - 5.81 MIL/uL Final   Hemoglobin 08/26/2020 11.3 (A) 13.0 - 17.0 g/dL Final   HCT 08/26/2020 35.0 (A) 39.0 - 52.0 % Final   MCV 08/26/2020 94.1  80.0 - 100.0 fL Final   MCH 08/26/2020 30.4  26.0 - 34.0 pg Final   MCHC 08/26/2020 32.3  30.0 - 36.0 g/dL Final   RDW 08/26/2020 12.7  11.5 - 15.5 % Final  Platelets 08/26/2020 171  150 - 400 K/uL Final   nRBC 08/26/2020 0.0  0.0 - 0.2 % Final   Performed at [facility], [place] [address], [place], [place] [address]   Sodium 08/26/2020 135  135 - 145 mmol/L Final   Potassium 08/26/2020 4.9  3.5 - 5.1 mmol/L Final   Chloride 08/26/2020 101  98 - 111 mmol/L Final   CO2 08/26/2020 25  22 - 32 mmol/L Final   Glucose, Bld 08/26/2020 167 (A) 70 - 99 mg/dL Final   Glucose reference range applies only to samples taken after fasting for at least 8 hours.   BUN 08/26/2020 16  8 - 23 mg/dL Final   Creatinine, Ser 08/26/2020 1.09  0.61 - 1.24 mg/dL Final   Calcium 08/26/2020 8.4 (A) 8.9 - 10.3 mg/dL Final   GFR, Estimated 08/26/2020 >60  >60 mL/min Final   Comment:  (NOTE) Calculated using the CKD-EPI Creatinine Equation (2021)    Anion gap 08/26/2020 9  5 - 15 Final   Performed at [facility], [place] [address], [place], [place] [address]   Glucose-Capillary 08/25/2020 230 (A) 70 - 99 mg/dL Final   Glucose reference range applies only to samples taken after fasting for at least 8 hours.   Glucose-Capillary 08/26/2020 142 (A) 70 - 99 mg/dL Final   Glucose reference range applies only to samples taken after fasting for at least 8 hours.  Hospital Outpatient Visit on 08/24/2020  Component Date Value Ref Range Status   SARS Coronavirus 2 08/24/2020 NEGATIVE  NEGATIVE Final   Comment: (NOTE) SARS-CoV-2 target nucleic acids are NOT DETECTED.  The SARS-CoV-2 RNA is generally detectable in upper and lower respiratory specimens during the acute phase of infection. Negative results do not preclude SARS-CoV-2 infection, do not rule out co-infections with other pathogens, and should not be used as the sole basis for treatment or other patient management decisions. Negative results must be combined with clinical observations, patient history, and epidemiological information. The expected result is Negative.  Fact Sheet for Patients: SugarRoll.be  Fact Sheet for Healthcare Providers: https://www.woods-mathews.com/  This test is not yet approved or cleared by the [place] FDA and  has been authorized for detection and/or diagnosis of SARS-CoV-2 by FDA under an Emergency Use Authorization (EUA). This EUA will remain  in effect (meaning this test can be used) for the duration of the COVID-19 declaration under Se                          ction 564(b)(1) of the Act, 21 U.S.C. section 360bbb-3(b)(1), unless the authorization is terminated or revoked sooner.  Performed at [facility], [place] [address], [place], [place] [address]   Hospital Outpatient Visit on 08/17/2020  Component Date  Value Ref Range Status   MRSA, PCR 08/17/2020 NEGATIVE  NEGATIVE Final   Staphylococcus aureus 08/17/2020 NEGATIVE  NEGATIVE Final   Comment: (NOTE) The Xpert SA Assay (FDA approved for NASAL specimens in patients 29 years of age and older), is one component of a comprehensive surveillance program. It is not intended to diagnose infection nor to guide or monitor treatment. Performed at [facility], [place] [address] [place], [place] [address]    Hgb A1c MFr Bld 08/17/2020 7.0 (A) 4.8 - 5.6 % Final   Comment: (NOTE) Pre diabetes:          5.7%-6.4%  Diabetes:              >  6.4%  Glycemic control for   <7.0% adults with diabetes    Mean Plasma Glucose 08/17/2020 154.2  mg/dL Final   Performed at [place] [address] [place], [place] [address]   WBC 08/17/2020 4.6  4.0 - 10.5 K/uL Final   RBC 08/17/2020 4.39  4.22 - 5.81 MIL/uL Final   Hemoglobin 08/17/2020 13.4  13.0 - 17.0 g/dL Final   HCT 08/17/2020 41.1  39.0 - 52.0 % Final   MCV 08/17/2020 93.6  80.0 - 100.0 fL Final   MCH 08/17/2020 30.5  26.0 - 34.0 pg Final   MCHC 08/17/2020 32.6  30.0 - 36.0 g/dL Final   RDW 08/17/2020 13.1  11.5 - 15.5 % Final   Platelets 08/17/2020 191  150 - 400 K/uL Final   nRBC 08/17/2020 0.0  0.0 - 0.2 % Final   Performed at [facility], [place] [address] [place], [place] [address]   Sodium 08/17/2020 139  135 - 145 mmol/L Final   Potassium 08/17/2020 4.4  3.5 - 5.1 mmol/L Final   Chloride 08/17/2020 104  98 - 111 mmol/L Final   CO2 08/17/2020 26  22 - 32 mmol/L Final   Glucose, Bld 08/17/2020 156 (A) 70 - 99 mg/dL Final   Glucose reference range applies only to samples taken after fasting for at least 8 hours.   BUN 08/17/2020 19  8 - 23 mg/dL Final   Creatinine, Ser 08/17/2020 1.37 (A) 0.61 - 1.24 mg/dL Final   Calcium 08/17/2020 9.3  8.9 - 10.3 mg/dL Final   Total Protein 08/17/2020 7.3  6.5 - 8.1 g/dL Final   Albumin 08/17/2020 4.0  3.5 -  5.0 g/dL Final   AST 08/17/2020 20  15 - 41 U/L Final   ALT 08/17/2020 16  0 - 44 U/L Final   Alkaline Phosphatase 08/17/2020 69  38 - 126 U/L Final   Total Bilirubin 08/17/2020 0.9  0.3 - 1.2 mg/dL Final   GFR, Estimated 08/17/2020 53 (A) >60 mL/min Final   Comment: (NOTE) Calculated using the CKD-EPI Creatinine Equation (2021)    Anion gap 08/17/2020 9  5 - 15 Final   Performed at [facility], [place] [address] [place], [place] [address]   Prothrombin Time 08/17/2020 12.6  11.4 - 15.2 seconds Final   INR 08/17/2020 0.9  0.8 - 1.2 Final   Comment: (NOTE) INR goal varies based on device and disease states. Performed at [facility], [place] [address] [place], [place] [address]    Glucose-Capillary 08/17/2020 180 (A) 70 - 99 mg/dL Final   Glucose reference range applies only to samples taken after fasting for at least 8 hours.   ABO/RH(D) 08/17/2020 O POS   Final   Antibody Screen 08/17/2020 NEG   Final   Sample Expiration 08/17/2020 08/28/2020,2359   Final   Extend sample reason 08/17/2020    Final                   Value:NO TRANSFUSIONS OR PREGNANCY IN THE PAST 3 MONTHS Performed at [place] [address], [place], [place] [address]      X-Rays:No results found.  EKG: Orders placed or performed in visit on 10/08/19   EKG 12-Lead     Hospital Course: SOHRAB VANNELLI is a 80 y.o. who [facility]. They were brought to the operating room on 08/25/2020 and underwent Procedure(s): Revision right knee unicompartmental arthroplasty  to total knee arthroplasty.  Patient tolerated the procedure well and was later transferred to the recovery room and then to the orthopaedic floor for postoperative care. They were given PO and IV analgesics for pain control following their surgery. They were given 24 hours of postoperative antibiotics of  Anti-infectives (From admission, onward)    Start     Dose/Rate  Route Frequency Ordered Stop   08/25/20 1600  ceFAZolin (ANCEF) 2 g in sodium chloride 0.9 % 100 mL IVPB        2 g 200 mL/hr over 30 Minutes Intravenous Every 6 hours 08/25/20 1338 08/25/20 2234   08/25/20 0800  ceFAZolin (ANCEF) 2 g in sodium chloride 0.9 % 100 mL IVPB        2 g 200 mL/hr over 30 Minutes Intravenous On call to O.R. 08/25/20 0757 08/25/20 1009      and started on DVT prophylaxis in the form of  Plavix '75mg'$  and Aspirin '325mg'$  .   PT and OT were ordered for total joint protocol. Discharge planning consulted to help with postop disposition and equipment needs.  Patient had an uneventful night on the evening of surgery. They started to get up OOB with therapy on 08/25/2020. Pt was seen during rounds and was ready to go home pending progress with therapy. He worked with therapy on POD #1 and was meeting goals. Pt was discharged to home later that day in stable condition.  Diet: Regular diet Activity: WBAT Follow-up: in two weeks Disposition: Home Discharged Condition: good   Discharge Instructions     Call MD / Call 911   Complete by: As directed    If you experience chest pain or shortness of breath, CALL 911 and be transported to the hospital emergency room.  If you develope a fever above 101 F, pus (white drainage) or increased drainage or redness at the wound, or calf pain, call your surgeon's office.   Change dressing   Complete by: As directed    You may remove the bulky bandage (ACE wrap and gauze) two days after surgery. You will have an adhesive waterproof bandage underneath. Leave this in place until your first follow-up appointment.   Constipation Prevention   Complete by: As directed    Drink plenty of fluids.  Prune juice may be helpful.  You may use a stool softener, such as Colace (over the counter) 100 mg twice a day.  Use MiraLax (over the counter) for constipation as needed.   Diet - low sodium heart healthy   Complete by: As directed    Do not put a  pillow under the knee. Place it under the heel.   Complete by: As directed    Driving restrictions   Complete by: As directed    No driving for two weeks   Post-operative opioid taper instructions:   Complete by: As directed    POST-OPERATIVE OPIOID TAPER INSTRUCTIONS: It is important to wean off of your opioid medication as soon as possible. If you do not need pain medication after your surgery it is ok to stop day one. Opioids include: Codeine, Hydrocodone(Norco, Vicodin), Oxycodone(Percocet, oxycontin) and hydromorphone amongst others.  Long term and even short term use of opiods can cause: Increased pain response Dependence Constipation Depression Respiratory depression And more.  Withdrawal symptoms can include Flu like symptoms Nausea, vomiting And more Techniques to manage these symptoms Hydrate well Eat regular healthy meals Stay active Use relaxation techniques(deep breathing, meditating, yoga) Do Not substitute Alcohol  to help with tapering If you have been on opioids for less than two weeks and do not have pain than it is ok to stop all together.  Plan to wean off of opioids This plan should start within one week post op of your joint replacement. Maintain the same interval or time between taking each dose and first decrease the dose.  Cut the total daily intake of opioids by one tablet each day Next start to increase the time between doses. The last dose that should be eliminated is the evening dose.      TED hose   Complete by: As directed    Use stockings (TED hose) for three weeks on both leg(s).  You may remove them at night for sleeping.   Weight bearing as tolerated   Complete by: As directed       Allergies as of 08/26/2020       Reactions   Statins Other (See Comments)   Severe stomach pain.   Oxycodone Itching   Hydrocodone Other (See Comments)   "messes with my hearing"   Codeine Itching   Morphine And Related Itching        Medication  List     TAKE these medications    acetaminophen 325 MG tablet Commonly known as: TYLENOL Take 2 tablets (650 mg total) by mouth every 6 (six) hours as needed for mild pain or headache.   Alogliptin Benzoate 12.5 MG Tabs Take 12.5 mg by mouth daily.   amLODipine 10 MG tablet Commonly known as: NORVASC Take 5 mg by mouth in the morning and at bedtime.   amoxicillin 500 MG capsule Commonly known as: AMOXIL Take 2,000 mg by mouth See admin instructions. Take 2000 mg by mouth 1 hour prior to dental work   clopidogrel 75 MG tablet Commonly known as: PLAVIX Take 1 tablet (75 mg total) by mouth daily.   Fish Oil 1000 MG Caps Take 1,000 mg by mouth daily.   FreeStyle Libre 2 Reader Kerrin Mo As directed   [employer] 2 Sensor Misc 1 Piece by Does not apply route every 14 (fourteen) days.   HYDROmorphone 2 MG tablet Commonly known as: DILAUDID Take 1 - 2 tablets (2 - 4 mg total) by mouth every 6 hours as needed for severe pain.   iron polysaccharides 150 MG capsule Commonly known as: NIFEREX Take 150 mg by mouth daily.   isosorbide mononitrate 60 MG 24 hr tablet Commonly known as: IMDUR Take 1.5 tablets (90 mg total) by mouth daily. What changed: how much to take   losartan 50 MG tablet Commonly known as: COZAAR Take 1 tablet (50 mg total) by mouth daily. What changed: when to take this   metFORMIN 500 MG 24 hr tablet Commonly known as: Glucophage XR Take 1 tablet (500 mg total) by mouth daily with breakfast.   methocarbamol 500 MG tablet Commonly known as: ROBAXIN Take 1 tablet (500 mg total) by mouth every 6 (six) hours as needed for muscle spasms.   metoprolol tartrate 25 MG tablet Commonly known as: LOPRESSOR TAKE 1 TABLET BY MOUTH TWICE A DAY What changed: how much to take   nitroGLYCERIN 0.4 MG SL tablet Commonly known as: NITROSTAT Place 1 tablet (0.4 mg total) under the tongue every 5 (five) minutes as needed for chest pain.   NovoLOG 70/30 FlexPen  (70-30) 100 UNIT/ML FlexPen Generic drug: insulin aspart protamine - aspart Inject 10 Units into the skin 2 (two) times daily before a meal.  pantoprazole 40 MG tablet Commonly known as: PROTONIX Take 40 mg by mouth daily.   traMADol 50 MG tablet Commonly known as: ULTRAM Take 1 - 2 tablets (50-100 mg) by mouth every 6 hours as needed for moderate pain.   TURMERIC PO Take 1,500 mg by mouth 2 (two) times daily.   Vitamin D (Cholecalciferol) 25 MCG (1000 UT) Caps Take 1,000 Units by mouth daily.   Zinc 50 MG Caps Take 50 mg by mouth daily.               Discharge Care Instructions  (From admission, onward)           Start     Ordered   08/26/20 0000  Weight bearing as tolerated        08/26/20 0718   08/26/20 0000  Change dressing       Comments: You may remove the bulky bandage (ACE wrap and gauze) two days after surgery. You will have an adhesive waterproof bandage underneath. Leave this in place until your first follow-up appointment.   08/26/20 [id number]            Follow-up Information     Gaynelle Arabian, MD Follow up.   Specialty: Orthopedic Surgery Why: You are scheduled for a follow-up appointment on 09/07/2020 at 3:45 pm Contact information: [address] [place] [address] [id number]                 Signed: Fenton Foy, MBA, PA-C Orthopedic Surgery 09/08/2020, 9:25 AM

## 2020-09-10 ENCOUNTER — Telehealth: Payer: Self-pay | Admitting: Physical Therapy

## 2020-09-10 ENCOUNTER — Other Ambulatory Visit: Payer: Self-pay

## 2020-09-10 ENCOUNTER — Ambulatory Visit: Payer: Medicare Other | Admitting: Physical Therapy

## 2020-09-10 ENCOUNTER — Encounter: Payer: Self-pay | Admitting: Physical Therapy

## 2020-09-10 DIAGNOSIS — R600 Localized edema: Secondary | ICD-10-CM

## 2020-09-10 DIAGNOSIS — M25661 Stiffness of right knee, not elsewhere classified: Secondary | ICD-10-CM | POA: Diagnosis not present

## 2020-09-10 DIAGNOSIS — G8929 Other chronic pain: Secondary | ICD-10-CM

## 2020-09-10 DIAGNOSIS — M25561 Pain in right knee: Secondary | ICD-10-CM | POA: Diagnosis not present

## 2020-09-10 DIAGNOSIS — M6281 Muscle weakness (generalized): Secondary | ICD-10-CM

## 2020-09-10 NOTE — Therapy (Signed)
[facility] [place], [place], [address] Phone: [phone]   Fax:  [phone]  Physical Therapy Treatment  Patient Details  Name: Matthew [name] MRN: [id number] Date of Birth: May 29, 1940 Referring Provider (PT): Gaynelle Arabian MD   Encounter Date: 09/10/2020   PT End of Session - 09/10/20 1001     Visit Number 6    Number of Visits 12    Date for PT Re-Evaluation 09/27/20    Authorization Type FOTO AT LEAST EVERY 5TH VISIT.  PROGRESS NOTE AT 10TH VISIT.  KX MODIFIER AFTER 15 VISITS.    PT Start Time [phone]    PT Stop Time 1040    PT Time Calculation (min) 49 min    Equipment Utilized During Treatment Other (comment)   FWW   Activity Tolerance Patient tolerated treatment well    Behavior During Therapy WFL for tasks assessed/performed             Past Medical History:  Diagnosis Date   Anticoagulated    plavix   Arthritis    Shoulder, knees, back    CAD in native artery cardiologist-  dr Angelena Form   a. CAD/NSTEMI ,  cardiac cath staged stenting-- 12-25-2016  PTCA and DES x3 to prox. and mid LAD;  12-26-2016  PCI to PLA and DES x1 to midRCA,  EF 60-65%.   Chronic lower back pain    CKD (chronic kidney disease), stage III (HCC)    Complication of anesthesia    "Too much with shoulder surgery", pt. reports that he was told the at they "lost him, due to absorbing too much anesthesia".  shoulder surgery 1985   DDD (degenerative disc disease), lumbosacral    GERD (gastroesophageal reflux disease)    History of gastric ulcer 01/2017   History of kidney stones    History of malignant melanoma    right side of nose   History of non-ST elevation myocardial infarction (NSTEMI) 12/23/2017   s/p  staged cardiac cath,  s/p  PCI and DEStenting   Hyperlipidemia    Hypertension    IDA (iron deficiency anemia)    Myocardial infarction ([place])    Nocturia    RBBB    Renal calculus, right    S/P drug eluting coronary stent  placement 12-25-2017,  12-26-2017   PTCA and DES x3 to prox. and mid LAD;  PCI to PLA and DES x1 to midRCA   Type 2 diabetes mellitus treated with insulin ([place])    followed by pcp    Past Surgical History:  Procedure Laterality Date   ANAL FISSURE REPAIR  X 2   CATARACT EXTRACTION W/ INTRAOCULAR LENS  IMPLANT, BILATERAL Bilateral 2017;  2015   COLONOSCOPY WITH PROPOFOL N/A 06/05/2016   Procedure: COLONOSCOPY WITH PROPOFOL;  Surgeon: Garlan Fair, MD;  [facility];  Service: Endoscopy;  Laterality: N/A;   CONVERSION TO TOTAL KNEE Right 08/25/2020   Procedure: Revision right knee unicompartmental arthroplasty to total knee arthroplasty;  Surgeon: Gaynelle Arabian, MD;  [facility]: [facility];  Service: Orthopedics;  Laterality: Right;   CORONARY ANGIOGRAPHY N/A 12/26/2016   Procedure: CORONARY ANGIOGRAPHY;  Surgeon: Troy Sine, MD;  Location: [place] [facility];  Service: Cardiovascular;  Laterality: N/A;   CORONARY STENT INTERVENTION N/A 12/25/2016   Procedure: CORONARY STENT INTERVENTION;  Surgeon: Burnell Blanks, MD;  Location: [place] [facility];  Service: Cardiovascular;  Laterality: N/A;   CORONARY STENT INTERVENTION N/A 12/26/2016   Procedure:  CORONARY STENT INTERVENTION;  Surgeon: Troy Sine, MD;  Location: [place] [facility];  Service: Cardiovascular;  Laterality: N/A;   CYSTOSCOPY/URETEROSCOPY/HOLMIUM LASER/STENT PLACEMENT Right 01/07/2018   Procedure: RIGHT URETEROSCOPY/HOLMIUM LASER/STENT PLACEMENT;  Surgeon: Lucas Mallow, MD;  Location: [facility];  Service: Urology;  Laterality: Right;   ESOPHAGOGASTRODUODENOSCOPY (EGD) WITH PROPOFOL N/A 01/12/2017   Procedure: ESOPHAGOGASTRODUODENOSCOPY (EGD) WITH PROPOFOL;  Surgeon: Wilford Corner, MD;  Location: [place];  Service: Endoscopy;  Laterality: N/A;   HAND TENDON SURGERY Right 10-29-2002   dr Burney Gauze '@[facility]'$    right index and thumb   KNEE ARTHROSCOPY Bilateral 2009-;2010   '@[facility]'$     LEFT HEART CATH AND CORONARY ANGIOGRAPHY N/A 12/25/2016   Procedure: LEFT HEART CATH AND CORONARY ANGIOGRAPHY;  Surgeon: Burnell Blanks, MD;  Location: [place] [facility];  Service: Cardiovascular;  Laterality: N/A;   LEFT HEART CATH AND CORONARY ANGIOGRAPHY N/A 10/10/2019   Procedure: LEFT HEART CATH AND CORONARY ANGIOGRAPHY;  Surgeon: Troy Sine, MD;  Location: [place] [facility];  Service: Cardiovascular;  Laterality: N/A;   LUMBAR LAMINECTOMY/DECOMPRESSION MICRODISCECTOMY N/A 06/09/2015   Procedure: Laminectomy - T12-L1;  Surgeon: Eustace Moore, MD;  Location: [place] [facility];  Service: Neurosurgery;  Laterality: N/A;  Laminectomy - T12-L1   MEDIAL PARTIAL KNEE REPLACEMENT Bilateral 2009-2010    [facility]    MELANOMA EXCISION Right    "side of my nose"   SHOULDER SURGERY Left 1985   TOTAL SHOULDER ARTHROPLASTY Left 12/06/2012   Procedure: LEFT TOTAL SHOULDER ARTHROPLASTY VERSES A REVERSE TOTAL SHOULDER ARTHROPLASTY;  Surgeon: Augustin Schooling, MD;  Location: [facility];  Service: Orthopedics;  Laterality: Left;    There were no vitals filed for this visit.   Subjective Assessment - 09/10/20 0958     Subjective COVID-19 screen performed prior to patient entering clinic. Wife requested education on home TENS unit they purchased. Reports that he has already done knee flexion exercises this morning.    Patient is accompained by: Family member   Wife   Pertinent History CAD, coronary stent, chroinic low back pain, left TSA, right hand surgery, HTN, low back surgery, prior right knee surgery.    How long can you walk comfortably? Around home with a FWW.    Patient Stated Goals Get around without pain.    Currently in Pain? Yes    Pain Score --   No pain score provided   Pain Location Knee    Pain Orientation Right    Pain Descriptors / Indicators Discomfort    Pain Type Surgical pain    Pain Onset More than a month ago    Pain Frequency Intermittent                [facility] PT  Assessment - 09/10/20 0001       Assessment   Medical Diagnosis Right total knee replacement    Referring Provider (PT) Gaynelle Arabian MD    Onset Date/Surgical Date 08/25/20    Next MD Visit 09/28/2020      Precautions   Precaution Comments No ultrasound.      Restrictions   Weight Bearing Restrictions No                           [facility] Adult PT Treatment/Exercise - 09/10/20 0001       Knee/Hip Exercises: Aerobic   Nustep L3, seat 10-9 x10 min      Knee/Hip Exercises:  Standing   Forward Lunges Right;15 reps;3 seconds    Forward Lunges Limitations off 6" step    Rocker Board 3 minutes      Modalities   Modalities Psychologist, educational Location R knee    Electrical Stimulation Action IFC    Electrical Stimulation Parameters 80-150 hz x10 min    Electrical Stimulation Goals Pain;Edema      Vasopneumatic   Number Minutes Vasopneumatic  10 minutes    Vasopnuematic Location  Knee    Vasopneumatic Pressure Low    Vasopneumatic Temperature  34 for pain /edema      Manual Therapy   Manual Therapy Passive ROM    Passive ROM PROM of R knee into flexion with light holds at end range; intermittant oscillation provided                         PT Long Term Goals - 09/08/20 1026       PT LONG TERM GOAL #1   Title Independent with a HEP.    Time 4    Period Weeks    Status On-going      PT LONG TERM GOAL #2   Title Full active right knee extension in order to normalize gait.    Baseline AROM -15 degrees PROM -10 degrees 09/08/20    Time 4    Period Weeks    Status On-going      PT LONG TERM GOAL #3   Title Active knee flexion to 115 degrees+ so the patient can perform functional tasks and do so with pain not > 2-3/10.    Baseline AROM 74 degrees PROM 80 degrees 09/08/20    Time 4    Period Weeks    Status On-going      PT LONG TERM GOAL #4   Title Increaseright  knee  and hip strength to a solid 4+/5 to provide good stability for accomplishment of functional activities.    Time 4    Period Weeks    Status On-going      PT LONG TERM GOAL #5   Title Perform a reciprocating stair gait with one railing with pain not > 2-3/10.    Time 4    Period Weeks    Status On-going                   Plan - 09/10/20 1034     Clinical Impression Statement Patient presented in clinic with reports of continued high level pain as well as edema throughout. Dark purple/blue ecchymosis noted over medial R thigh. Patient guided through ROM exercises. B TED hose donned to BLE and patient's wife brought in TENS unit which was later found as NMS model. Patient's wife instructed to return as pain control model is the desired unit. Patient and wife educated regarding set up of TENS unit via clinic's model. Intermittant oscillations provided during PROM to relieve pain. Normal modalities response noted following removal of the modalities.    Personal Factors and Comorbidities Other;Comorbidity 1;Comorbidity 2    Comorbidities CAD, coronary stent, chroinic low back pain, left TSA, right hand surgery, HTN, low back surgery, prior right knee surgery.    Examination-Activity Limitations Other;Transfers;Locomotion Level;Stairs    Examination-Participation Restrictions Other    Stability/Clinical Decision Making Stable/Uncomplicated    Rehab Potential Excellent    PT Frequency 3x / week    PT Duration 4  weeks    PT Treatment/Interventions ADLs/Self Care Home Management;Cryotherapy;Electrical Stimulation;Moist Heat;Functional mobility Arts administrator;Therapeutic activities;Therapeutic exercise;Neuromuscular re-education;Manual techniques;Patient/family education;Passive range of motion;Vasopneumatic Device    PT Next Visit Plan Progress into TKA protocol.  Vasopneumatic.    Consulted and Agree with Plan of Care Patient;Family member/caregiver    Family Member  Consulted Wife             Patient will benefit from skilled therapeutic intervention in order to improve the following deficits and impairments:  Pain, Abnormal gait, Difficulty walking, Decreased activity tolerance, Decreased mobility, Decreased range of motion, Decreased strength, Increased edema  Visit Diagnosis: Muscle weakness (generalized)  Chronic pain of right knee  Stiffness of right knee, not elsewhere classified  Localized edema     Problem List Patient Active Problem List   Diagnosis Date Noted   Pain due to unicompartmental arthroplasty of knee ([place]) 08/25/2020   Peripheral arterial disease ([place]) 11/19/2019   RBBB 01/16/2017   PUD (peptic ulcer disease) 01/16/2017   Melena 01/12/2017   Unstable angina ([place]) 01/11/2017   CAD S/P percutaneous coronary angioplasty 01/09/2017   Mixed hyperlipidemia 01/09/2017   DM type 2 causing vascular disease ([place]) 12/24/2016   Essential hypertension, benign 12/24/2016   Chest pain, rule out acute myocardial infarction 12/24/2016   ACS (acute coronary syndrome) [facility])    History of NSTEMI    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

## 2020-09-13 ENCOUNTER — Other Ambulatory Visit: Payer: Self-pay

## 2020-09-13 ENCOUNTER — Ambulatory Visit: Payer: Medicare Other | Admitting: Physical Therapy

## 2020-09-13 ENCOUNTER — Encounter: Payer: Self-pay | Admitting: Physical Therapy

## 2020-09-13 DIAGNOSIS — R600 Localized edema: Secondary | ICD-10-CM

## 2020-09-13 DIAGNOSIS — M25561 Pain in right knee: Secondary | ICD-10-CM

## 2020-09-13 DIAGNOSIS — M6281 Muscle weakness (generalized): Secondary | ICD-10-CM | POA: Diagnosis not present

## 2020-09-13 DIAGNOSIS — G8929 Other chronic pain: Secondary | ICD-10-CM

## 2020-09-13 DIAGNOSIS — M25661 Stiffness of right knee, not elsewhere classified: Secondary | ICD-10-CM | POA: Diagnosis not present

## 2020-09-13 NOTE — Therapy (Signed)
[facility] [place], [place], [address] Phone: [phone]   Fax:  [phone]  Physical Therapy Treatment  Patient Details  Name: Matthew [name] MRN: [id number] Date of Birth: 28-Aug-1940 Referring Provider (PT): Gaynelle Arabian MD   Encounter Date: 09/13/2020   PT End of Session - 09/13/20 1147     Visit Number 7    Number of Visits 12    Date for PT Re-Evaluation 09/27/20    Authorization Type FOTO AT LEAST EVERY 5TH VISIT.  PROGRESS NOTE AT 10TH VISIT.  KX MODIFIER AFTER 15 VISITS.    PT Start Time 0945    PT Stop Time 1039    PT Time Calculation (min) 54 min    Behavior During Therapy [facility] for tasks assessed/performed             Past Medical History:  Diagnosis Date   Anticoagulated    plavix   Arthritis    Shoulder, knees, back    CAD in native artery cardiologist-  dr Angelena Form   a. CAD/NSTEMI ,  cardiac cath staged stenting-- 12-25-2016  PTCA and DES x3 to prox. and mid LAD;  12-26-2016  PCI to PLA and DES x1 to midRCA,  EF 60-65%.   Chronic lower back pain    CKD (chronic kidney disease), stage III (HCC)    Complication of anesthesia    "Too much with shoulder surgery", pt. reports that he was told the at they "lost him, due to absorbing too much anesthesia".  shoulder surgery 1985   DDD (degenerative disc disease), lumbosacral    GERD (gastroesophageal reflux disease)    History of gastric ulcer 01/2017   History of kidney stones    History of malignant melanoma    right side of nose   History of non-ST elevation myocardial infarction (NSTEMI) 12/23/2017   s/p  staged cardiac cath,  s/p  PCI and DEStenting   Hyperlipidemia    Hypertension    IDA (iron deficiency anemia)    Myocardial infarction ([place])    Nocturia    RBBB    Renal calculus, right    S/P drug eluting coronary stent placement 12-25-2017,  12-26-2017   PTCA and DES x3 to prox. and mid LAD;  PCI to PLA and DES x1 to midRCA   Type 2  diabetes mellitus treated with insulin ([place])    followed by pcp    Past Surgical History:  Procedure Laterality Date   ANAL FISSURE REPAIR  X 2   CATARACT EXTRACTION W/ INTRAOCULAR LENS  IMPLANT, BILATERAL Bilateral 2017;  2015   COLONOSCOPY WITH PROPOFOL N/A 06/05/2016   Procedure: COLONOSCOPY WITH PROPOFOL;  Surgeon: Garlan Fair, MD;  [facility];  Service: Endoscopy;  Laterality: N/A;   CONVERSION TO TOTAL KNEE Right 08/25/2020   Procedure: Revision right knee unicompartmental arthroplasty to total knee arthroplasty;  Surgeon: Gaynelle Arabian, MD;  [facility];  Service: Orthopedics;  Laterality: Right;   CORONARY ANGIOGRAPHY N/A 12/26/2016   Procedure: CORONARY ANGIOGRAPHY;  Surgeon: Troy Sine, MD;  Location: [place] [facility];  Service: Cardiovascular;  Laterality: N/A;   CORONARY STENT INTERVENTION N/A 12/25/2016   Procedure: CORONARY STENT INTERVENTION;  Surgeon: Burnell Blanks, MD;  Location: [place] [facility];  Service: Cardiovascular;  Laterality: N/A;   CORONARY STENT INTERVENTION N/A 12/26/2016   Procedure: CORONARY STENT INTERVENTION;  Surgeon: Troy Sine, MD;  Location: [place] [facility];  Service: Cardiovascular;  Laterality:  N/A;   CYSTOSCOPY/URETEROSCOPY/HOLMIUM LASER/STENT PLACEMENT Right 01/07/2018   Procedure: RIGHT URETEROSCOPY/HOLMIUM LASER/STENT PLACEMENT;  Surgeon: Lucas Mallow, MD;  Location: [facility];  Service: Urology;  Laterality: Right;   ESOPHAGOGASTRODUODENOSCOPY (EGD) WITH PROPOFOL N/A 01/12/2017   Procedure: ESOPHAGOGASTRODUODENOSCOPY (EGD) WITH PROPOFOL;  Surgeon: Wilford Corner, MD;  Location: [facility];  Service: Endoscopy;  Laterality: N/A;   HAND TENDON SURGERY Right 10-29-2002   dr Burney Gauze '@[facility]'$    right index and thumb   KNEE ARTHROSCOPY Bilateral 2009-;2010   '@[facility]'$    LEFT HEART CATH AND CORONARY ANGIOGRAPHY N/A 12/25/2016   Procedure: LEFT HEART CATH AND CORONARY ANGIOGRAPHY;   Surgeon: Burnell Blanks, MD;  Location: [place] [facility];  Service: Cardiovascular;  Laterality: N/A;   LEFT HEART CATH AND CORONARY ANGIOGRAPHY N/A 10/10/2019   Procedure: LEFT HEART CATH AND CORONARY ANGIOGRAPHY;  Surgeon: Troy Sine, MD;  Location: [place] [facility];  Service: Cardiovascular;  Laterality: N/A;   LUMBAR LAMINECTOMY/DECOMPRESSION MICRODISCECTOMY N/A 06/09/2015   Procedure: Laminectomy - T12-L1;  Surgeon: Eustace Moore, MD;  Location: [place] [facility];  Service: Neurosurgery;  Laterality: N/A;  Laminectomy - T12-L1   MEDIAL PARTIAL KNEE REPLACEMENT Bilateral 2009-2010    [facility]    MELANOMA EXCISION Right    "side of my nose"   SHOULDER SURGERY Left 1985   TOTAL SHOULDER ARTHROPLASTY Left 12/06/2012   Procedure: LEFT TOTAL SHOULDER ARTHROPLASTY VERSES A REVERSE TOTAL SHOULDER ARTHROPLASTY;  Surgeon: Augustin Schooling, MD;  Location: [facility];  Service: Orthopedics;  Laterality: Left;    There were no vitals filed for this visit.   Subjective Assessment - 09/13/20 1132     Subjective COVID-19 screen performed prior to patient entering clinic.  In a lot of pain.    Pertinent History CAD, coronary stent, chroinic low back pain, left TSA, right hand surgery, HTN, low back surgery, prior right knee surgery.    Patient Stated Goals Get around without pain.    Currently in Pain? Yes    Pain Score 10-Worst pain ever    Pain Location Knee    Pain Orientation Right    Pain Descriptors / Indicators Throbbing;Aching    Pain Type Surgical pain    Pain Onset More than a month ago                               [facility] Adult PT Treatment/Exercise - 09/13/20 0001       Exercises   Exercises Knee/Hip      Knee/Hip Exercises: Aerobic   Nustep Level 3 monitored and moving seat forward x 2 x 23 minutes.      Acupuncturist Location Right knee.    Electrical Stimulation Action IFC    Electrical Stimulation Parameters 80-150  Hz. x 20 minutes.    Electrical Stimulation Goals Pain;Edema      Vasopneumatic   Number Minutes Vasopneumatic  20 minutes    Vasopnuematic Location  --   Right knee.   Vasopneumatic Pressure Low                         PT Long Term Goals - 09/08/20 1026       PT LONG TERM GOAL #1   Title Independent with a HEP.    Time 4    Period Weeks    Status On-going  PT LONG TERM GOAL #2   Title Full active right knee extension in order to normalize gait.    Baseline AROM -15 degrees PROM -10 degrees 09/08/20    Time 4    Period Weeks    Status On-going      PT LONG TERM GOAL #3   Title Active knee flexion to 115 degrees+ so the patient can perform functional tasks and do so with pain not > 2-3/10.    Baseline AROM 74 degrees PROM 80 degrees 09/08/20    Time 4    Period Weeks    Status On-going      PT LONG TERM GOAL #4   Title Increaseright  knee and hip strength to a solid 4+/5 to provide good stability for accomplishment of functional activities.    Time 4    Period Weeks    Status On-going      PT LONG TERM GOAL #5   Title Perform a reciprocating stair gait with one railing with pain not > 2-3/10.    Time 4    Period Weeks    Status On-going                   Plan - 09/13/20 1141     Clinical Impression Statement Patient in a lo tof pain today and did not want to be pushed.  He was able to tolerate the Nustep for range of motion fairly well today.  Normal modality response following removal of modalities today.    Personal Factors and Comorbidities Other;Comorbidity 1;Comorbidity 2    Comorbidities CAD, coronary stent, chroinic low back pain, left TSA, right hand surgery, HTN, low back surgery, prior right knee surgery.    Examination-Activity Limitations Other;Transfers;Locomotion Level;Stairs    Examination-Participation Restrictions Other    Stability/Clinical Decision Making Stable/Uncomplicated    Rehab Potential Excellent    PT Frequency  3x / week    PT Duration 4 weeks    PT Treatment/Interventions ADLs/Self Care Home Management;Cryotherapy;Electrical Stimulation;Moist Heat;Functional mobility Arts administrator;Therapeutic activities;Therapeutic exercise;Neuromuscular re-education;Manual techniques;Patient/family education;Passive range of motion;Vasopneumatic Device    PT Next Visit Plan Progress into TKA protocol.  Vasopneumatic.    Family Member Consulted Wife             Patient will benefit from skilled therapeutic intervention in order to improve the following deficits and impairments:  Pain, Abnormal gait, Difficulty walking, Decreased activity tolerance, Decreased mobility, Decreased range of motion, Decreased strength, Increased edema  Visit Diagnosis: Muscle weakness (generalized)  Chronic pain of right knee  Localized edema     Problem List Patient Active Problem List   Diagnosis Date Noted   Pain due to unicompartmental arthroplasty of knee ([place]) 08/25/2020   Peripheral arterial disease ([place]) 11/19/2019   RBBB 01/16/2017   PUD (peptic ulcer disease) 01/16/2017   Melena 01/12/2017   Unstable angina ([place]) 01/11/2017   CAD S/P percutaneous coronary angioplasty 01/09/2017   Mixed hyperlipidemia 01/09/2017   DM type 2 causing vascular disease ([place]) 12/24/2016   Essential hypertension, benign 12/24/2016   Chest pain, rule out acute myocardial infarction 12/24/2016   ACS (acute coronary syndrome) [facility])    History of NSTEMI    Myelopathy ([place]) 06/09/2015    Elizah Mierzwa, [place] MPT 09/13/2020, 11:48 AM  [facility] [address] [place], [place], [address] Phone: [phone]   Fax:  [phone]  Name: LO ROSTON MRN: [id number] Date of Birth: 12-07-1940

## 2020-09-15 ENCOUNTER — Ambulatory Visit: Payer: Medicare Other | Admitting: Physical Therapy

## 2020-09-15 ENCOUNTER — Other Ambulatory Visit: Payer: Self-pay

## 2020-09-15 DIAGNOSIS — M25561 Pain in right knee: Secondary | ICD-10-CM | POA: Diagnosis not present

## 2020-09-15 DIAGNOSIS — M25661 Stiffness of right knee, not elsewhere classified: Secondary | ICD-10-CM

## 2020-09-15 DIAGNOSIS — G8929 Other chronic pain: Secondary | ICD-10-CM

## 2020-09-15 DIAGNOSIS — R600 Localized edema: Secondary | ICD-10-CM

## 2020-09-15 DIAGNOSIS — M6281 Muscle weakness (generalized): Secondary | ICD-10-CM | POA: Diagnosis not present

## 2020-09-15 NOTE — Therapy (Signed)
[facility] [place], [place], [address] Phone: [phone]   Fax:  [phone]  Physical Therapy Treatment  Patient Details  Name: Matthew [name] MRN: [id number] Date of Birth: [birth year] Referring Provider (PT): Gaynelle Arabian MD   Encounter Date: 09/15/2020   PT End of Session - 09/15/20 1517     Visit Number 8    Number of Visits 12    Date for PT Re-Evaluation 09/27/20    Authorization Type FOTO AT LEAST EVERY 5TH VISIT.  PROGRESS NOTE AT 10TH VISIT.  KX MODIFIER AFTER 15 VISITS.    PT Start Time 0230    PT Stop Time 0328    PT Time Calculation (min) 58 min    Activity Tolerance Patient tolerated treatment well    Behavior During Therapy WFL for tasks assessed/performed             Past Medical History:  Diagnosis Date   Anticoagulated    plavix   Arthritis    Shoulder, knees, back    CAD in native artery cardiologist-  dr Angelena Form   a. CAD/NSTEMI ,  cardiac cath staged stenting-- 12-25-2016  PTCA and DES x3 to prox. and mid LAD;  12-26-2016  PCI to PLA and DES x1 to midRCA,  EF 60-65%.   Chronic lower back pain    CKD (chronic kidney disease), stage III (HCC)    Complication of anesthesia    "Too much with shoulder surgery", pt. reports that he was told the at they "lost him, due to absorbing too much anesthesia".  shoulder surgery 1985   DDD (degenerative disc disease), lumbosacral    GERD (gastroesophageal reflux disease)    History of gastric ulcer 01/2017   History of kidney stones    History of malignant melanoma    right side of nose   History of non-ST elevation myocardial infarction (NSTEMI) 12/23/2017   s/p  staged cardiac cath,  s/p  PCI and DEStenting   Hyperlipidemia    Hypertension    IDA (iron deficiency anemia)    Myocardial infarction ([place])    Nocturia    RBBB    Renal calculus, right    S/P drug eluting coronary stent placement 12-25-2017,  12-26-2017   PTCA and DES x3 to prox.  and mid LAD;  PCI to PLA and DES x1 to midRCA   Type 2 diabetes mellitus treated with insulin ([place])    followed by pcp    Past Surgical History:  Procedure Laterality Date   ANAL FISSURE REPAIR  X 2   CATARACT EXTRACTION W/ INTRAOCULAR LENS  IMPLANT, BILATERAL Bilateral 2017;  2015   COLONOSCOPY WITH PROPOFOL N/A 06/05/2016   Procedure: COLONOSCOPY WITH PROPOFOL;  Surgeon: Garlan Fair, MD;  Location: [facility];  Service: Endoscopy;  Laterality: N/A;   CONVERSION TO TOTAL KNEE Right 08/25/2020   Procedure: Revision right knee unicompartmental arthroplasty to total knee arthroplasty;  Surgeon: Gaynelle Arabian, MD;  Location: [facility];  Service: Orthopedics;  Laterality: Right;   CORONARY ANGIOGRAPHY N/A 12/26/2016   Procedure: CORONARY ANGIOGRAPHY;  Surgeon: Troy Sine, MD;  Location: [place] [facility];  Service: Cardiovascular;  Laterality: N/A;   CORONARY STENT INTERVENTION N/A 12/25/2016   Procedure: CORONARY STENT INTERVENTION;  Surgeon: Burnell Blanks, MD;  Location: [place] [facility];  Service: Cardiovascular;  Laterality: N/A;   CORONARY STENT INTERVENTION N/A 12/26/2016   Procedure: CORONARY STENT INTERVENTION;  Surgeon: Troy Sine, MD;  Location:  [place] [facility];  Service: Cardiovascular;  Laterality: N/A;   CYSTOSCOPY/URETEROSCOPY/HOLMIUM LASER/STENT PLACEMENT Right 01/07/2018   Procedure: RIGHT URETEROSCOPY/HOLMIUM LASER/STENT PLACEMENT;  Surgeon: Lucas Mallow, MD;  Location: [facility];  Service: Urology;  Laterality: Right;   ESOPHAGOGASTRODUODENOSCOPY (EGD) WITH PROPOFOL N/A 01/12/2017   Procedure: ESOPHAGOGASTRODUODENOSCOPY (EGD) WITH PROPOFOL;  Surgeon: Wilford Corner, MD;  Location: [facility];  Service: Endoscopy;  Laterality: N/A;   HAND TENDON SURGERY Right 10-29-2002   dr Burney Gauze '@[facility]'$    right index and thumb   KNEE ARTHROSCOPY Bilateral 2009-;2010   '@[facility]'$    LEFT HEART CATH AND CORONARY ANGIOGRAPHY N/A 12/25/2016    Procedure: LEFT HEART CATH AND CORONARY ANGIOGRAPHY;  Surgeon: Burnell Blanks, MD;  Location: [place] [facility];  Service: Cardiovascular;  Laterality: N/A;   LEFT HEART CATH AND CORONARY ANGIOGRAPHY N/A 10/10/2019   Procedure: LEFT HEART CATH AND CORONARY ANGIOGRAPHY;  Surgeon: Troy Sine, MD;  Location: [place];  Service: Cardiovascular;  Laterality: N/A;   LUMBAR LAMINECTOMY/DECOMPRESSION MICRODISCECTOMY N/A 06/09/2015   Procedure: Laminectomy - T12-L1;  Surgeon: Eustace Moore, MD;  Location: [facility];  Service: Neurosurgery;  Laterality: N/A;  Laminectomy - T12-L1   MEDIAL PARTIAL KNEE REPLACEMENT Bilateral 2009-2010    [facility]    MELANOMA EXCISION Right    "side of my nose"   SHOULDER SURGERY Left 1985   TOTAL SHOULDER ARTHROPLASTY Left 12/06/2012   Procedure: LEFT TOTAL SHOULDER ARTHROPLASTY VERSES A REVERSE TOTAL SHOULDER ARTHROPLASTY;  Surgeon: Augustin Schooling, MD;  Location: [facility];  Service: Orthopedics;  Laterality: Left;    There were no vitals filed for this visit.   Subjective Assessment - 09/15/20 1514     Subjective COVID-19 screen performed prior to patient entering clinic.  Still hurting.    Pertinent History CAD, coronary stent, chroinic low back pain, left TSA, right hand surgery, HTN, low back surgery, prior right knee surgery.    How long can you walk comfortably? Around home with a FWW.    Patient Stated Goals Get around without pain.    Currently in Pain? Yes    Pain Score 8     Pain Orientation Right    Pain Descriptors / Indicators Throbbing;Aching                [facility] PT Assessment - 09/15/20 0001       PROM   PROM Assessment Site Knee    Right/Left Knee Right    Right Knee Flexion 95                           [facility] Adult PT Treatment/Exercise - 09/15/20 0001       Exercises   Exercises Knee/Hip;Ankle      Knee/Hip Exercises: Aerobic   Nustep Level 3 progressing to seat 7 today over 15 minutes.       Modalities   Modalities Health visitor Stimulation Location Right knee.    Electrical Stimulation Action IFC    Electrical Stimulation Parameters 80-150 Hz x 20 minutes.    Electrical Stimulation Goals Pain;Edema      Vasopneumatic   Number Minutes Vasopneumatic  20 minutes    Vasopnuematic Location  --   Right knee.   Vasopneumatic Pressure Medium      Manual Therapy   Manual Therapy Passive ROM    Passive ROM In supine:  PROM to patient tolerance x 8 minutes.      Ankle Exercises: Standing   Rocker Board Limitations 3 minutes.                         PT Long Term Goals - 09/08/20 1026       PT LONG TERM GOAL #1   Title Independent with a HEP.    Time 4    Period Weeks    Status On-going      PT LONG TERM GOAL #2   Title Full active right knee extension in order to normalize gait.    Baseline AROM -15 degrees PROM -10 degrees 09/08/20    Time 4    Period Weeks    Status On-going      PT LONG TERM GOAL #3   Title Active knee flexion to 115 degrees+ so the patient can perform functional tasks and do so with pain not > 2-3/10.    Baseline AROM 74 degrees PROM 80 degrees 09/08/20    Time 4    Period Weeks    Status On-going      PT LONG TERM GOAL #4   Title Increaseright  knee and hip strength to a solid 4+/5 to provide good stability for accomplishment of functional activities.    Time 4    Period Weeks    Status On-going      PT LONG TERM GOAL #5   Title Perform a reciprocating stair gait with one railing with pain not > 2-3/10.    Time 4    Period Weeks    Status On-going                   Plan - 09/15/20 1521     Clinical Impression Statement The patient continues to report a high level of pain.  He was able to achieve passive right knee flexion to 95 degrees today.  He has been using a TENS unit at home.  Informed patient and wife to use per instructions and make sure  skin is dry and to avoid turning up too high to avoid burns.  Normal modality response following removal of modality.    Personal Factors and Comorbidities Other;Comorbidity 1;Comorbidity 2    Comorbidities CAD, coronary stent, chroinic low back pain, left TSA, right hand surgery, HTN, low back surgery, prior right knee surgery.    Examination-Participation Restrictions Other    Stability/Clinical Decision Making Stable/Uncomplicated    Rehab Potential Excellent    PT Frequency 3x / week    PT Next Visit Plan Progress into TKA protocol.  Vasopneumatic.    Consulted and Agree with Plan of Care Patient;Family member/caregiver             Patient will benefit from skilled therapeutic intervention in order to improve the following deficits and impairments:  Pain, Abnormal gait, Difficulty walking, Decreased activity tolerance, Decreased mobility, Decreased range of motion, Decreased strength, Increased edema  Visit Diagnosis: Muscle weakness (generalized)  Chronic pain of right knee  Localized edema  Stiffness of right knee, not elsewhere classified     Problem List Patient Active Problem List   Diagnosis Date Noted   Pain due to unicompartmental arthroplasty of knee ([place]) 08/25/2020   Peripheral arterial disease ([place]) 11/19/2019   RBBB 01/16/2017   PUD (peptic ulcer disease) 01/16/2017   Melena 01/12/2017   Unstable angina ([place]) 01/11/2017   CAD S/P percutaneous coronary angioplasty 01/09/2017  Mixed hyperlipidemia 01/09/2017   DM type 2 causing vascular disease ([place]) 12/24/2016   Essential hypertension, benign 12/24/2016   Chest pain, rule out acute myocardial infarction 12/24/2016   ACS (acute coronary syndrome) [facility])    History of NSTEMI    Myelopathy ([place]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 [place], [place], [address] Phone: [phone]   Fax:  [phone]  Name: Matthew [name] MRN: [id number] Date of Birth: Nov 19, 1940

## 2020-09-17 ENCOUNTER — Other Ambulatory Visit: Payer: Self-pay

## 2020-09-17 ENCOUNTER — Ambulatory Visit: Payer: Medicare Other | Admitting: Physical Therapy

## 2020-09-17 DIAGNOSIS — M6281 Muscle weakness (generalized): Secondary | ICD-10-CM | POA: Diagnosis not present

## 2020-09-17 DIAGNOSIS — M25561 Pain in right knee: Secondary | ICD-10-CM | POA: Diagnosis not present

## 2020-09-17 DIAGNOSIS — G8929 Other chronic pain: Secondary | ICD-10-CM

## 2020-09-17 DIAGNOSIS — R600 Localized edema: Secondary | ICD-10-CM | POA: Diagnosis not present

## 2020-09-17 DIAGNOSIS — M25661 Stiffness of right knee, not elsewhere classified: Secondary | ICD-10-CM | POA: Diagnosis not present

## 2020-09-17 NOTE — Therapy (Signed)
[facility] [place], [place], [address] Phone: [phone]   Fax:  [phone]  Physical Therapy Treatment  Patient Details  Name: Matthew [name] MRN: [id number] Date of Birth: 03-May-1940 Referring Provider (PT): Gaynelle Arabian MD   Encounter Date: 09/17/2020   PT End of Session - 09/17/20 1141     Visit Number 9    Number of Visits 12    Date for PT Re-Evaluation 09/27/20    Authorization Type FOTO AT LEAST EVERY 5TH VISIT.  PROGRESS NOTE AT 10TH VISIT.  KX MODIFIER AFTER 15 VISITS.    PT Start Time 0945    PT Stop Time 1044    PT Time Calculation (min) 59 min    Activity Tolerance Patient tolerated treatment well;Patient limited by pain    Behavior During Therapy [facility] for tasks assessed/performed             Past Medical History:  Diagnosis Date   Anticoagulated    plavix   Arthritis    Shoulder, knees, back    CAD in native artery cardiologist-  dr Angelena Form   a. CAD/NSTEMI ,  cardiac cath staged stenting-- 12-25-2016  PTCA and DES x3 to prox. and mid LAD;  12-26-2016  PCI to PLA and DES x1 to midRCA,  EF 60-65%.   Chronic lower back pain    CKD (chronic kidney disease), stage III (HCC)    Complication of anesthesia    "Too much with shoulder surgery", pt. reports that he was told the at they "lost him, due to absorbing too much anesthesia".  shoulder surgery 1985   DDD (degenerative disc disease), lumbosacral    GERD (gastroesophageal reflux disease)    History of gastric ulcer 01/2017   History of kidney stones    History of malignant melanoma    right side of nose   History of non-ST elevation myocardial infarction (NSTEMI) 12/23/2017   s/p  staged cardiac cath,  s/p  PCI and DEStenting   Hyperlipidemia    Hypertension    IDA (iron deficiency anemia)    Myocardial infarction ([place])    Nocturia    RBBB    Renal calculus, right    S/P drug eluting coronary stent placement 12-25-2017,  12-26-2017    PTCA and DES x3 to prox. and mid LAD;  PCI to PLA and DES x1 to midRCA   Type 2 diabetes mellitus treated with insulin ([place])    followed by pcp    Past Surgical History:  Procedure Laterality Date   ANAL FISSURE REPAIR  X 2   CATARACT EXTRACTION W/ INTRAOCULAR LENS  IMPLANT, BILATERAL Bilateral 2017;  2015   COLONOSCOPY WITH PROPOFOL N/A 06/05/2016   Procedure: COLONOSCOPY WITH PROPOFOL;  Surgeon: Garlan Fair, MD;  [facility];  Service: Endoscopy;  Laterality: N/A;   CONVERSION TO TOTAL KNEE Right 08/25/2020   Procedure: Revision right knee unicompartmental arthroplasty to total knee arthroplasty;  Surgeon: Gaynelle Arabian, MD;  [facility];  Service: Orthopedics;  Laterality: Right;   CORONARY ANGIOGRAPHY N/A 12/26/2016   Procedure: CORONARY ANGIOGRAPHY;  Surgeon: Troy Sine, MD;  Location: [place] [facility];  Service: Cardiovascular;  Laterality: N/A;   CORONARY STENT INTERVENTION N/A 12/25/2016   Procedure: CORONARY STENT INTERVENTION;  Surgeon: Burnell Blanks, MD;  Location: [place] [facility];  Service: Cardiovascular;  Laterality: N/A;   CORONARY STENT INTERVENTION N/A 12/26/2016   Procedure: CORONARY STENT INTERVENTION;  Surgeon: Troy Sine,  MD;  Location: [facility] [facility];  Service: Cardiovascular;  Laterality: N/A;   CYSTOSCOPY/URETEROSCOPY/HOLMIUM LASER/STENT PLACEMENT Right 01/07/2018   Procedure: RIGHT URETEROSCOPY/HOLMIUM LASER/STENT PLACEMENT;  Surgeon: Lucas Mallow, MD;  Location: [facility];  Service: Urology;  Laterality: Right;   ESOPHAGOGASTRODUODENOSCOPY (EGD) WITH PROPOFOL N/A 01/12/2017   Procedure: ESOPHAGOGASTRODUODENOSCOPY (EGD) WITH PROPOFOL;  Surgeon: Wilford Corner, MD;  Location: [facility];  Service: Endoscopy;  Laterality: N/A;   HAND TENDON SURGERY Right 10-29-2002   dr Burney Gauze '@[facility]'$    right index and thumb   KNEE ARTHROSCOPY Bilateral 2009-;2010   '@[facility]    LEFT HEART CATH AND CORONARY  ANGIOGRAPHY N/A 12/25/2016   Procedure: LEFT HEART CATH AND CORONARY ANGIOGRAPHY;  Surgeon: Burnell Blanks, MD;  Location: [facility];  Service: Cardiovascular;  Laterality: N/A;   LEFT HEART CATH AND CORONARY ANGIOGRAPHY N/A 10/10/2019   Procedure: LEFT HEART CATH AND CORONARY ANGIOGRAPHY;  Surgeon: Troy Sine, MD;  Location: [facility];  Service: Cardiovascular;  Laterality: N/A;   LUMBAR LAMINECTOMY/DECOMPRESSION MICRODISCECTOMY N/A 06/09/2015   Procedure: Laminectomy - T12-L1;  Surgeon: Eustace Moore, MD;  Location: [facility];  Service: Neurosurgery;  Laterality: N/A;  Laminectomy - T12-L1   MEDIAL PARTIAL KNEE REPLACEMENT Bilateral 2009-2010    [facility]    MELANOMA EXCISION Right    "side of my nose"   SHOULDER SURGERY Left 1985   TOTAL SHOULDER ARTHROPLASTY Left 12/06/2012   Procedure: LEFT TOTAL SHOULDER ARTHROPLASTY VERSES A REVERSE TOTAL SHOULDER ARTHROPLASTY;  Surgeon: Augustin Schooling, MD;  Location: [facility];  Service: Orthopedics;  Laterality: Left;    There were no vitals filed for this visit.   Subjective Assessment - 09/17/20 1130     Subjective COVID-19 screen performed prior to patient entering clinic.  Doing okay.    Patient is accompained by: Family member    Pertinent History CAD, coronary stent, chroinic low back pain, left TSA, right hand surgery, HTN, low back surgery, prior right knee surgery.    How long can you walk comfortably? Around home with a FWW.    Patient Stated Goals Get around without pain.    Currently in Pain? Yes    Pain Score 8     Pain Location Knee    Pain Orientation Right    Pain Descriptors / Indicators Throbbing;Aching    Pain Type Surgical pain    Pain Onset More than a month ago                [facility] PT Assessment - 09/17/20 0001       PROM   PROM Assessment Site Knee    Right/Left Knee Right    Right Knee Flexion 100                           [facility] Adult PT Treatment/Exercise -  09/17/20 0001       Exercises   Exercises Knee/Hip      Knee/Hip Exercises: Aerobic   Nustep Level 3 progressing to seat 6 over 15 minutes.      Knee/Hip Exercises: Machines for Strengthening   Cybex Knee Extension 10# x 2 minutes.    Cybex Knee Flexion 30# x 2 minutes.      Modalities   Modalities Psychologist, educational Location Right knee    Electrical Stimulation Action IFC at 80-150 Hz.  Electrical Stimulation Parameters 40% scan x 20 minutes.    Electrical Stimulation Goals Pain;Edema      Vasopneumatic   Number Minutes Vasopneumatic  20 minutes    Vasopnuematic Location  --   Right knee.   Vasopneumatic Pressure Medium      Manual Therapy   Manual Therapy Passive ROM    Passive ROM In supine:  PROM into right knee flexion and extension x 8 minutes.                         PT Long Term Goals - 09/08/20 1026       PT LONG TERM GOAL #1   Title Independent with a HEP.    Time 4    Period Weeks    Status On-going      PT LONG TERM GOAL #2   Title Full active right knee extension in order to normalize gait.    Baseline AROM -15 degrees PROM -10 degrees 09/08/20    Time 4    Period Weeks    Status On-going      PT LONG TERM GOAL #3   Title Active knee flexion to 115 degrees+ so the patient can perform functional tasks and do so with pain not > 2-3/10.    Baseline AROM 74 degrees PROM 80 degrees 09/08/20    Time 4    Period Weeks    Status On-going      PT LONG TERM GOAL #4   Title Increaseright  knee and hip strength to a solid 4+/5 to provide good stability for accomplishment of functional activities.    Time 4    Period Weeks    Status On-going      PT LONG TERM GOAL #5   Title Perform a reciprocating stair gait with one railing with pain not > 2-3/10.    Time 4    Period Weeks    Status On-going                   Plan - 09/17/20 1136     Clinical  Impression Statement Patient did well today.  He achieved seat 6 on the Nustep today and began weight machines (quad/ham) and was able to achieve passive right knee flexion to 100 degrees.    Personal Factors and Comorbidities Other;Comorbidity 1;Comorbidity 2    Comorbidities CAD, coronary stent, chroinic low back pain, left TSA, right hand surgery, HTN, low back surgery, prior right knee surgery.    Examination-Activity Limitations Other;Transfers;Locomotion Level;Stairs    Examination-Participation Restrictions Other    Stability/Clinical Decision Making Stable/Uncomplicated    Rehab Potential Excellent    PT Frequency 3x / week    PT Duration 4 weeks    PT Treatment/Interventions ADLs/Self Care Home Management;Cryotherapy;Electrical Stimulation;Moist Heat;Functional mobility Arts administrator;Therapeutic activities;Therapeutic exercise;Neuromuscular re-education;Manual techniques;Patient/family education;Passive range of motion;Vasopneumatic Device    PT Next Visit Plan Progress into TKA protocol.  Vasopneumatic.    Consulted and Agree with Plan of Care Patient;Family member/caregiver    Family Member Consulted Wife             Patient will benefit from skilled therapeutic intervention in order to improve the following deficits and impairments:  Pain, Abnormal gait, Difficulty walking, Decreased activity tolerance, Decreased mobility, Decreased range of motion, Decreased strength, Increased edema  Visit Diagnosis: Muscle weakness (generalized)  Chronic pain of right knee  Localized edema  Stiffness of right knee, not elsewhere classified  Problem List Patient Active Problem List   Diagnosis Date Noted   Pain due to unicompartmental arthroplasty of knee ([place]) 08/25/2020   Peripheral arterial disease ([place]) 11/19/2019   RBBB 01/16/2017   PUD (peptic ulcer disease) 01/16/2017   Melena 01/12/2017   Unstable angina ([place]) 01/11/2017   CAD S/P  percutaneous coronary angioplasty 01/09/2017   Mixed hyperlipidemia 01/09/2017   DM type 2 causing vascular disease ([place]) 12/24/2016   Essential hypertension, benign 12/24/2016   Chest pain, rule out acute myocardial infarction 12/24/2016   ACS (acute coronary syndrome) [facility])    History of NSTEMI    Myelopathy ([place]) 06/09/2015    Priscilla Finklea, [place] MPT 09/17/2020, 11:43 AM  [facility] [address] [place], [place], [address] Phone: [phone]   Fax:  [phone]  Name: Matthew [name] MRN: [id number] Date of Birth: 1940/05/24

## 2020-09-20 ENCOUNTER — Other Ambulatory Visit: Payer: Self-pay

## 2020-09-20 ENCOUNTER — Ambulatory Visit: Payer: Medicare Other | Admitting: Physical Therapy

## 2020-09-20 DIAGNOSIS — M25661 Stiffness of right knee, not elsewhere classified: Secondary | ICD-10-CM | POA: Diagnosis not present

## 2020-09-20 DIAGNOSIS — G8929 Other chronic pain: Secondary | ICD-10-CM | POA: Diagnosis not present

## 2020-09-20 DIAGNOSIS — R600 Localized edema: Secondary | ICD-10-CM | POA: Diagnosis not present

## 2020-09-20 DIAGNOSIS — M6281 Muscle weakness (generalized): Secondary | ICD-10-CM

## 2020-09-20 DIAGNOSIS — M25561 Pain in right knee: Secondary | ICD-10-CM

## 2020-09-20 NOTE — Therapy (Addendum)
[facility] [place], [place], [address] Phone: [phone]   Fax:  [phone]  Physical Therapy Treatment  Patient Details  Name: Matthew [name] MRN: [id number] Date of Birth: 12/10/1940 Referring Provider (PT): Gaynelle Arabian MD   Encounter Date: 09/20/2020   PT End of Session - 09/20/20 0930     Visit Number 10    Number of Visits 12    Date for PT Re-Evaluation 09/27/20    Authorization Type FOTO AT LEAST EVERY 5TH VISIT.  PROGRESS NOTE AT 10TH VISIT.  KX MODIFIER AFTER 15 VISITS.    PT Start Time 0815    PT Stop Time 0909    PT Time Calculation (min) 54 min    Activity Tolerance Patient tolerated treatment well;Patient limited by pain    Behavior During Therapy [facility] for tasks assessed/performed             Past Medical History:  Diagnosis Date   Anticoagulated    plavix   Arthritis    Shoulder, knees, back    CAD in native artery cardiologist-  dr Angelena Form   a. CAD/NSTEMI ,  cardiac cath staged stenting-- 12-25-2016  PTCA and DES x3 to prox. and mid LAD;  12-26-2016  PCI to PLA and DES x1 to midRCA,  EF 60-65%.   Chronic lower back pain    CKD (chronic kidney disease), stage III (HCC)    Complication of anesthesia    "Too much with shoulder surgery", pt. reports that he was told the at they "lost him, due to absorbing too much anesthesia".  shoulder surgery 1985   DDD (degenerative disc disease), lumbosacral    GERD (gastroesophageal reflux disease)    History of gastric ulcer 01/2017   History of kidney stones    History of malignant melanoma    right side of nose   History of non-ST elevation myocardial infarction (NSTEMI) 12/23/2017   s/p  staged cardiac cath,  s/p  PCI and DEStenting   Hyperlipidemia    Hypertension    IDA (iron deficiency anemia)    Myocardial infarction ([place])    Nocturia    RBBB    Renal calculus, right    S/P drug eluting coronary stent placement 12-25-2017,  12-26-2017    PTCA and DES x3 to prox. and mid LAD;  PCI to PLA and DES x1 to midRCA   Type 2 diabetes mellitus treated with insulin ([place])    followed by pcp    Past Surgical History:  Procedure Laterality Date   ANAL FISSURE REPAIR  X 2   CATARACT EXTRACTION W/ INTRAOCULAR LENS  IMPLANT, BILATERAL Bilateral 2017;  2015   COLONOSCOPY WITH PROPOFOL N/A 06/05/2016   Procedure: COLONOSCOPY WITH PROPOFOL;  Surgeon: Garlan Fair, MD;  [facility];  Service: Endoscopy;  Laterality: N/A;   CONVERSION TO TOTAL KNEE Right 08/25/2020   Procedure: Revision right knee unicompartmental arthroplasty to total knee arthroplasty;  Surgeon: Gaynelle Arabian, MD;  [facility]: [facility];  Service: Orthopedics;  Laterality: Right;   CORONARY ANGIOGRAPHY N/A 12/26/2016   Procedure: CORONARY ANGIOGRAPHY;  Surgeon: Troy Sine, MD;  Location: [facility];  Service: Cardiovascular;  Laterality: N/A;   CORONARY STENT INTERVENTION N/A 12/25/2016   Procedure: CORONARY STENT INTERVENTION;  Surgeon: Burnell Blanks, MD;  Location: [place] [facility];  Service: Cardiovascular;  Laterality: N/A;   CORONARY STENT INTERVENTION N/A 12/26/2016   Procedure: CORONARY STENT INTERVENTION;  Surgeon: Troy Sine,  MD;  Location: [place] [facility];  Service: Cardiovascular;  Laterality: N/A;   CYSTOSCOPY/URETEROSCOPY/HOLMIUM LASER/STENT PLACEMENT Right 01/07/2018   Procedure: RIGHT URETEROSCOPY/HOLMIUM LASER/STENT PLACEMENT;  Surgeon: Lucas Mallow, MD;  [facility]: [facility];  Service: Urology;  Laterality: Right;   ESOPHAGOGASTRODUODENOSCOPY (EGD) WITH PROPOFOL N/A 01/12/2017   Procedure: ESOPHAGOGASTRODUODENOSCOPY (EGD) WITH PROPOFOL;  Surgeon: Wilford Corner, MD;  Location: [place];  Service: Endoscopy;  Laterality: N/A;   HAND TENDON SURGERY Right 10-29-2002   dr Burney Gauze '@[facility]'    right index and thumb   KNEE ARTHROSCOPY Bilateral 2009-;2010   '@[facility]'    LEFT HEART CATH AND CORONARY  ANGIOGRAPHY N/A 12/25/2016   Procedure: LEFT HEART CATH AND CORONARY ANGIOGRAPHY;  Surgeon: Burnell Blanks, MD;  Location: [place];  Service: Cardiovascular;  Laterality: N/A;   LEFT HEART CATH AND CORONARY ANGIOGRAPHY N/A 10/10/2019   Procedure: LEFT HEART CATH AND CORONARY ANGIOGRAPHY;  Surgeon: Troy Sine, MD;  Location: [place] [facility];  Service: Cardiovascular;  Laterality: N/A;   LUMBAR LAMINECTOMY/DECOMPRESSION MICRODISCECTOMY N/A 06/09/2015   Procedure: Laminectomy - T12-L1;  Surgeon: Eustace Moore, MD;  Location: [place];  Service: Neurosurgery;  Laterality: N/A;  Laminectomy - T12-L1   MEDIAL PARTIAL KNEE REPLACEMENT Bilateral 2009-2010    [facility]    MELANOMA EXCISION Right    "side of my nose"   SHOULDER SURGERY Left 1985   TOTAL SHOULDER ARTHROPLASTY Left 12/06/2012   Procedure: LEFT TOTAL SHOULDER ARTHROPLASTY VERSES A REVERSE TOTAL SHOULDER ARTHROPLASTY;  Surgeon: Augustin Schooling, MD;  Location: [facility];  Service: Orthopedics;  Laterality: Left;    There were no vitals filed for this visit.   Subjective Assessment - 09/20/20 0836     Subjective COVID-19 screen performed prior to patient entering clinic.  Doing okay.    Patient is accompained by: Family member    Pertinent History CAD, coronary stent, chroinic low back pain, left TSA, right hand surgery, HTN, low back surgery, prior right knee surgery.    How long can you walk comfortably? Around home with a FWW.    Patient Stated Goals Get around without pain.    Currently in Pain? Yes                               [facility] Adult PT Treatment/Exercise - 09/20/20 0001       Exercises   Exercises Knee/Hip      Knee/Hip Exercises: Aerobic   Nustep level 2 progressing to seat 5 over 15 minutes.      Knee/Hip Exercises: Machines for Strengthening   Cybex Knee Extension 10# x 2 minutes.    Cybex Knee Flexion 30# x 2 minutes.    Cybex Leg Press 2 plates x 2 minutes.       Acupuncturist Location Right knee.    Electrical Stimulation Action IFC at 80-150 Hz.    Electrical Stimulation Parameters 40% scan x 15 minutes.    Electrical Stimulation Goals Pain;Edema      Vasopneumatic   Number Minutes Vasopneumatic  15 minutes    Vasopnuematic Location  --   Right knee.   Vasopneumatic Pressure Medium      Manual Therapy   Manual Therapy Passive ROM    Passive ROM In supine:  PROM x 4 minutes into patient's right knee flexion and extension.  PT Long Term Goals - 09/20/20 0931       PT LONG TERM GOAL #1   Title Independent with a HEP.    Time 4    Period Weeks    Status On-going      PT LONG TERM GOAL #2   Title Full active right knee extension in order to normalize gait.    Baseline AROM -15 degrees PROM -10 degrees 09/08/20    Period Weeks    Status On-going      PT LONG TERM GOAL #3   Title Active knee flexion to 115 degrees+ so the patient can perform functional tasks and do so with pain not > 2-3/10.    Baseline PROM 100 degrees (09/20/20).    Time 4    Period Weeks    Status On-going      PT LONG TERM GOAL #4   Title Increaseright  knee and hip strength to a solid 4+/5 to provide good stability for accomplishment of functional activities.    Time 4    Period Weeks    Status On-going      PT LONG TERM GOAL #5   Title Perform a reciprocating stair gait with one railing with pain not > 2-3/10.    Time 4    Period Weeks    Status On-going                   Plan - 09/20/20 0931     Clinical Impression Statement See 'Therapy Note" section.  Normal modality repsonse following removal of modality.    Personal Factors and Comorbidities Other;Comorbidity 1;Comorbidity 2    Comorbidities CAD, coronary stent, chroinic low back pain, left TSA, right hand surgery, HTN, low back surgery, prior right knee surgery.    Examination-Activity Limitations  Other;Transfers;Locomotion Level;Stairs    Examination-Participation Restrictions Other    Stability/Clinical Decision Making Stable/Uncomplicated    Rehab Potential Excellent    PT Frequency 3x / week    PT Duration 4 weeks    PT Treatment/Interventions ADLs/Self Care Home Management;Cryotherapy;Electrical Stimulation;Moist Heat;Functional mobility Arts administrator;Therapeutic activities;Therapeutic exercise;Neuromuscular re-education;Manual techniques;Patient/family education;Passive range of motion;Vasopneumatic Device    PT Next Visit Plan Progress into TKA protocol.  Vasopneumatic.    Family Member Consulted Wife             Patient will benefit from skilled therapeutic intervention in order to improve the following deficits and impairments:  Pain, Abnormal gait, Difficulty walking, Decreased activity tolerance, Decreased mobility, Decreased range of motion, Decreased strength, Increased edema  Visit Diagnosis: Muscle weakness (generalized)  Chronic pain of right knee  Localized edema  Stiffness of right knee, not elsewhere classified     Problem List Patient Active Problem List   Diagnosis Date Noted   Pain due to unicompartmental arthroplasty of knee ([place]) 08/25/2020   Peripheral arterial disease ([place]) 11/19/2019   RBBB 01/16/2017   PUD (peptic ulcer disease) 01/16/2017   Melena 01/12/2017   Unstable angina ([place]) 01/11/2017   CAD S/P percutaneous coronary angioplasty 01/09/2017   Mixed hyperlipidemia 01/09/2017   DM type 2 causing vascular disease (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 Patient is progressing slowly but is much  improved since his  initial evaluation.  No goals met at this time.    Arwin Bisceglia, [place] MPT 09/20/2020, 11:55 AM  [facility] [address] [place], [place], [address] Phone: [phone]   Fax:  [phone]  Name: Matthew [name] MRN: [id number] Date of Birth: 03-30-1940

## 2020-09-22 ENCOUNTER — Other Ambulatory Visit: Payer: Self-pay

## 2020-09-22 ENCOUNTER — Ambulatory Visit: Payer: Medicare Other | Admitting: Physical Therapy

## 2020-09-22 ENCOUNTER — Encounter: Payer: Self-pay | Admitting: Physical Therapy

## 2020-09-22 DIAGNOSIS — M6281 Muscle weakness (generalized): Secondary | ICD-10-CM

## 2020-09-22 DIAGNOSIS — G8929 Other chronic pain: Secondary | ICD-10-CM | POA: Diagnosis not present

## 2020-09-22 DIAGNOSIS — M25661 Stiffness of right knee, not elsewhere classified: Secondary | ICD-10-CM | POA: Diagnosis not present

## 2020-09-22 DIAGNOSIS — R600 Localized edema: Secondary | ICD-10-CM

## 2020-09-22 DIAGNOSIS — M25561 Pain in right knee: Secondary | ICD-10-CM | POA: Diagnosis not present

## 2020-09-22 NOTE — Therapy (Signed)
[facility] [place], [place], [address] Phone: [phone]   Fax:  [phone]  Physical Therapy Treatment  Patient Details  Name: Matthew [name] MRN: [id number] Date of Birth: 06-Nov-1940 Referring Provider (PT): Gaynelle Arabian MD   Encounter Date: 09/22/2020   PT End of Session - 09/22/20 1139     Visit Number 11    Number of Visits 12    Date for PT Re-Evaluation 09/27/20    Authorization Type FOTO AT LEAST EVERY 5TH VISIT.  PROGRESS NOTE AT 10TH VISIT.  KX MODIFIER AFTER 15 VISITS.    PT Start Time [phone]    PT Stop Time 1045    PT Time Calculation (min) 55 min    Activity Tolerance Patient tolerated treatment well;Patient limited by pain    Behavior During Therapy [facility] for tasks assessed/performed             Past Medical History:  Diagnosis Date   Anticoagulated    plavix   Arthritis    Shoulder, knees, back    CAD in native artery cardiologist-  dr Angelena Form   a. CAD/NSTEMI ,  cardiac cath staged stenting-- 12-25-2016  PTCA and DES x3 to prox. and mid LAD;  12-26-2016  PCI to PLA and DES x1 to midRCA,  EF 60-65%.   Chronic lower back pain    CKD (chronic kidney disease), stage III (HCC)    Complication of anesthesia    "Too much with shoulder surgery", pt. reports that he was told the at they "lost him, due to absorbing too much anesthesia".  shoulder surgery 1985   DDD (degenerative disc disease), lumbosacral    GERD (gastroesophageal reflux disease)    History of gastric ulcer 01/2017   History of kidney stones    History of malignant melanoma    right side of nose   History of non-ST elevation myocardial infarction (NSTEMI) 12/23/2017   s/p  staged cardiac cath,  s/p  PCI and DEStenting   Hyperlipidemia    Hypertension    IDA (iron deficiency anemia)    Myocardial infarction ([place])    Nocturia    RBBB    Renal calculus, right    S/P drug eluting coronary stent placement 12-25-2017,  12-26-2017    PTCA and DES x3 to prox. and mid LAD;  PCI to PLA and DES x1 to midRCA   Type 2 diabetes mellitus treated with insulin ([place])    followed by pcp    Past Surgical History:  Procedure Laterality Date   ANAL FISSURE REPAIR  X 2   CATARACT EXTRACTION W/ INTRAOCULAR LENS  IMPLANT, BILATERAL Bilateral 2017;  2015   COLONOSCOPY WITH PROPOFOL N/A 06/05/2016   Procedure: COLONOSCOPY WITH PROPOFOL;  Surgeon: Garlan Fair, MD;  [facility];  Service: Endoscopy;  Laterality: N/A;   CONVERSION TO TOTAL KNEE Right 08/25/2020   Procedure: Revision right knee unicompartmental arthroplasty to total knee arthroplasty;  Surgeon: Gaynelle Arabian, MD;  [facility];  Service: Orthopedics;  Laterality: Right;   CORONARY ANGIOGRAPHY N/A 12/26/2016   Procedure: CORONARY ANGIOGRAPHY;  Surgeon: Troy Sine, MD;  Location: [place];  Service: Cardiovascular;  Laterality: N/A;   CORONARY STENT INTERVENTION N/A 12/25/2016   Procedure: CORONARY STENT INTERVENTION;  Surgeon: Burnell Blanks, MD;  Location: [facility];  Service: Cardiovascular;  Laterality: N/A;   CORONARY STENT INTERVENTION N/A 12/26/2016   Procedure: CORONARY STENT INTERVENTION;  Surgeon: Troy Sine,  MD;  Location: [facility] [facility];  Service: Cardiovascular;  Laterality: N/A;   CYSTOSCOPY/URETEROSCOPY/HOLMIUM LASER/STENT PLACEMENT Right 01/07/2018   Procedure: RIGHT URETEROSCOPY/HOLMIUM LASER/STENT PLACEMENT;  Surgeon: Lucas Mallow, MD;  Location: [facility];  Service: Urology;  Laterality: Right;   ESOPHAGOGASTRODUODENOSCOPY (EGD) WITH PROPOFOL N/A 01/12/2017   Procedure: ESOPHAGOGASTRODUODENOSCOPY (EGD) WITH PROPOFOL;  Surgeon: Wilford Corner, MD;  Location: [facility];  Service: Endoscopy;  Laterality: N/A;   HAND TENDON SURGERY Right 10-29-2002   dr Burney Gauze '@[facility]'$    right index and thumb   KNEE ARTHROSCOPY Bilateral 2009-;2010   '@[facility]    LEFT HEART CATH AND CORONARY  ANGIOGRAPHY N/A 12/25/2016   Procedure: LEFT HEART CATH AND CORONARY ANGIOGRAPHY;  Surgeon: Burnell Blanks, MD;  Location: [place];  Service: Cardiovascular;  Laterality: N/A;   LEFT HEART CATH AND CORONARY ANGIOGRAPHY N/A 10/10/2019   Procedure: LEFT HEART CATH AND CORONARY ANGIOGRAPHY;  Surgeon: Troy Sine, MD;  Location: [facility];  Service: Cardiovascular;  Laterality: N/A;   LUMBAR LAMINECTOMY/DECOMPRESSION MICRODISCECTOMY N/A 06/09/2015   Procedure: Laminectomy - T12-L1;  Surgeon: Eustace Moore, MD;  Location: [place] [facility];  Service: Neurosurgery;  Laterality: N/A;  Laminectomy - T12-L1   MEDIAL PARTIAL KNEE REPLACEMENT Bilateral 2009-2010    [facility]    MELANOMA EXCISION Right    "side of my nose"   SHOULDER SURGERY Left 1985   TOTAL SHOULDER ARTHROPLASTY Left 12/06/2012   Procedure: LEFT TOTAL SHOULDER ARTHROPLASTY VERSES A REVERSE TOTAL SHOULDER ARTHROPLASTY;  Surgeon: Augustin Schooling, MD;  Location: [facility];  Service: Orthopedics;  Laterality: Left;    There were no vitals filed for this visit.   Subjective Assessment - 09/22/20 1141     Subjective COVID-19 screen performed prior to patient entering clinic.  No new complaints.    Patient is accompained by: Family member    Pertinent History CAD, coronary stent, chroinic low back pain, left TSA, right hand surgery, HTN, low back surgery, prior right knee surgery.    How long can you walk comfortably? Around home with a FWW.    Patient Stated Goals Get around without pain.    Currently in Pain? Yes    Pain Score 7     Pain Location Knee    Pain Orientation Right    Pain Descriptors / Indicators Throbbing;Aching    Pain Onset More than a month ago                [facility] PT Assessment - 09/22/20 0001       PROM   PROM Assessment Site Knee    Right/Left Knee Right    Right Knee Flexion 105                           [facility] Adult PT Treatment/Exercise - 09/22/20 0001        Exercises   Exercises Knee/Hip      Knee/Hip Exercises: Aerobic   Recumbent Bike 10 minutes with patient performing "half-moon" stretches and he was able to make one backward revolution.    Nustep Level 5 x 5 minutes.      Knee/Hip Exercises: Machines for Strengthening   Cybex Knee Extension 10# x 2 minutes.    Cybex Knee Flexion 30# x 2 minutes    Cybex Leg Press 2 plates x 2 minutes.      Modalities   Modalities Electrical Stimulation;Vasopneumatic  Acupuncturist Location Right knee    Electrical Stimulation Action IFC at 80-150 Hz.    Electrical Stimulation Parameters 40% scan x 20 minutes.    Electrical Stimulation Goals Edema;Pain      Vasopneumatic   Number Minutes Vasopneumatic  20 minutes    Vasopnuematic Location  --   Right knee.   Vasopneumatic Pressure Medium      Manual Therapy   Manual Therapy Passive ROM    Passive ROM In supine:  PROM to patient tolerance x 4 minutes into flexion and extension                         PT Long Term Goals - 09/20/20 0931       PT LONG TERM GOAL #1   Title Independent with a HEP.    Time 4    Period Weeks    Status On-going      PT LONG TERM GOAL #2   Title Full active right knee extension in order to normalize gait.    Baseline AROM -15 degrees PROM -10 degrees 09/08/20    Period Weeks    Status On-going      PT LONG TERM GOAL #3   Title Active knee flexion to 115 degrees+ so the patient can perform functional tasks and do so with pain not > 2-3/10.    Baseline PROM 100 degrees (09/20/20).    Time 4    Period Weeks    Status On-going      PT LONG TERM GOAL #4   Title Increaseright  knee and hip strength to a solid 4+/5 to provide good stability for accomplishment of functional activities.    Time 4    Period Weeks    Status On-going      PT LONG TERM GOAL #5   Title Perform a reciprocating stair gait with one railing with pain not > 2-3/10.    Time 4     Period Weeks    Status On-going                   Plan - 09/22/20 1147     Clinical Impression Statement The patient did very well today.  He progressed to the stationary for right knee flexion stretching and he was able to make one backward revolution at seat 6.  He achived passive right knee flexion to 105 degrees today.    Personal Factors and Comorbidities Other;Comorbidity 1;Comorbidity 2    Comorbidities CAD, coronary stent, chroinic low back pain, left TSA, right hand surgery, HTN, low back surgery, prior right knee surgery.    Examination-Activity Limitations Other;Transfers;Locomotion Level;Stairs    Examination-Participation Restrictions Other    Stability/Clinical Decision Making Stable/Uncomplicated    Rehab Potential Excellent    PT Frequency 3x / week    PT Duration 4 weeks    PT Treatment/Interventions ADLs/Self Care Home Management;Cryotherapy;Electrical Stimulation;Moist Heat;Functional mobility Arts administrator;Therapeutic activities;Therapeutic exercise;Neuromuscular re-education;Manual techniques;Patient/family education;Passive range of motion;Vasopneumatic Device    PT Next Visit Plan Progress into TKA protocol.  Vasopneumatic.    Consulted and Agree with Plan of Care Patient;Family member/caregiver    Family Member Consulted Wife             Patient will benefit from skilled therapeutic intervention in order to improve the following deficits and impairments:  Pain, Abnormal gait, Difficulty walking, Decreased activity tolerance, Decreased mobility, Decreased range of motion, Decreased strength, Increased edema  Visit Diagnosis: Muscle weakness (generalized)  Chronic pain of right knee  Localized edema  Stiffness of right knee, not elsewhere classified     Problem List Patient Active Problem List   Diagnosis Date Noted   Pain due to unicompartmental arthroplasty of knee ([place]) 08/25/2020   Peripheral arterial disease  ([place]) 11/19/2019   RBBB 01/16/2017   PUD (peptic ulcer disease) 01/16/2017   Melena 01/12/2017   Unstable angina ([place]) 01/11/2017   CAD S/P percutaneous coronary angioplasty 01/09/2017   Mixed hyperlipidemia 01/09/2017   DM type 2 causing vascular disease ([place]) 12/24/2016   Essential hypertension, benign 12/24/2016   Chest pain, rule out acute myocardial infarction 12/24/2016   ACS (acute coronary syndrome) [facility])    History of NSTEMI    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 [place], [place], [address] Phone: [phone]   Fax:  [phone]  Name: Matthew [name] MRN: [id number] Date of Birth: 04-16-1940

## 2020-09-24 ENCOUNTER — Other Ambulatory Visit: Payer: Self-pay

## 2020-09-24 ENCOUNTER — Ambulatory Visit: Payer: Medicare Other | Admitting: Physical Therapy

## 2020-09-24 DIAGNOSIS — M25661 Stiffness of right knee, not elsewhere classified: Secondary | ICD-10-CM | POA: Diagnosis not present

## 2020-09-24 DIAGNOSIS — G8929 Other chronic pain: Secondary | ICD-10-CM | POA: Diagnosis not present

## 2020-09-24 DIAGNOSIS — M25561 Pain in right knee: Secondary | ICD-10-CM

## 2020-09-24 DIAGNOSIS — M6281 Muscle weakness (generalized): Secondary | ICD-10-CM

## 2020-09-24 DIAGNOSIS — R600 Localized edema: Secondary | ICD-10-CM | POA: Diagnosis not present

## 2020-09-24 NOTE — Therapy (Signed)
[facility] [place], [place], [address] Phone: [phone]   Fax:  [phone]  Physical Therapy Treatment  Patient Details  Name: Matthew [name] MRN: [id number] Date of Birth: [birth year] Referring Provider (PT): Gaynelle Arabian MD   Encounter Date: 09/24/2020   PT End of Session - 09/24/20 1104     Visit Number 12    Number of Visits 18    Date for PT Re-Evaluation 10/18/20    Authorization Type FOTO AT LEAST EVERY 5TH VISIT.  PROGRESS NOTE AT 10TH VISIT.  KX MODIFIER AFTER 15 VISITS.    PT Start Time 0945    PT Stop Time 1044    PT Time Calculation (min) 59 min    Activity Tolerance Patient tolerated treatment well;Patient limited by pain    Behavior During Therapy [facility] for tasks assessed/performed             Past Medical History:  Diagnosis Date   Anticoagulated    plavix   Arthritis    Shoulder, knees, back    CAD in native artery cardiologist-  dr Angelena Form   a. CAD/NSTEMI ,  cardiac cath staged stenting-- 12-25-2016  PTCA and DES x3 to prox. and mid LAD;  12-26-2016  PCI to PLA and DES x1 to midRCA,  EF 60-65%.   Chronic lower back pain    CKD (chronic kidney disease), stage III (HCC)    Complication of anesthesia    "Too much with shoulder surgery", pt. reports that he was told the at they "lost him, due to absorbing too much anesthesia".  shoulder surgery 1985   DDD (degenerative disc disease), lumbosacral    GERD (gastroesophageal reflux disease)    History of gastric ulcer 01/2017   History of kidney stones    History of malignant melanoma    right side of nose   History of non-ST elevation myocardial infarction (NSTEMI) 12/23/2017   s/p  staged cardiac cath,  s/p  PCI and DEStenting   Hyperlipidemia    Hypertension    IDA (iron deficiency anemia)    Myocardial infarction ([place])    Nocturia    RBBB    Renal calculus, right    S/P drug eluting coronary stent placement 12-25-2017,  12-26-2017    PTCA and DES x3 to prox. and mid LAD;  PCI to PLA and DES x1 to midRCA   Type 2 diabetes mellitus treated with insulin ([place])    followed by pcp    Past Surgical History:  Procedure Laterality Date   ANAL FISSURE REPAIR  X 2   CATARACT EXTRACTION W/ INTRAOCULAR LENS  IMPLANT, BILATERAL Bilateral 2017;  2015   COLONOSCOPY WITH PROPOFOL N/A 06/05/2016   Procedure: COLONOSCOPY WITH PROPOFOL;  Surgeon: Garlan Fair, MD;  [facility];  Service: Endoscopy;  Laterality: N/A;   CONVERSION TO TOTAL KNEE Right 08/25/2020   Procedure: Revision right knee unicompartmental arthroplasty to total knee arthroplasty;  Surgeon: Gaynelle Arabian, MD;  [facility];  Service: Orthopedics;  Laterality: Right;   CORONARY ANGIOGRAPHY N/A 12/26/2016   Procedure: CORONARY ANGIOGRAPHY;  Surgeon: Troy Sine, MD;  Location: [place] [facility];  Service: Cardiovascular;  Laterality: N/A;   CORONARY STENT INTERVENTION N/A 12/25/2016   Procedure: CORONARY STENT INTERVENTION;  Surgeon: Burnell Blanks, MD;  Location: [place] [facility];  Service: Cardiovascular;  Laterality: N/A;   CORONARY STENT INTERVENTION N/A 12/26/2016   Procedure: CORONARY STENT INTERVENTION;  Surgeon: Troy Sine,  MD;  Location: [place] [facility];  Service: Cardiovascular;  Laterality: N/A;   CYSTOSCOPY/URETEROSCOPY/HOLMIUM LASER/STENT PLACEMENT Right 01/07/2018   Procedure: RIGHT URETEROSCOPY/HOLMIUM LASER/STENT PLACEMENT;  Surgeon: Lucas Mallow, MD;  Location[facility];  Service: Urology;  Laterality: Right;   ESOPHAGOGASTRODUODENOSCOPY (EGD) WITH PROPOFOL N/A 01/12/2017   Procedure: ESOPHAGOGASTRODUODENOSCOPY (EGD) WITH PROPOFOL;  Surgeon: Wilford Corner, MD;  Location: [place];  Service: Endoscopy;  Laterality: N/A;   HAND TENDON SURGERY Right 10-29-2002   dr Burney Gauze '@[facility]'$    right index and thumb   KNEE ARTHROSCOPY Bilateral 2009-;2010   '@[facility]    LEFT HEART CATH AND CORONARY  ANGIOGRAPHY N/A 12/25/2016   Procedure: LEFT HEART CATH AND CORONARY ANGIOGRAPHY;  Surgeon: Burnell Blanks, MD;  Location: [place] [facility];  Service: Cardiovascular;  Laterality: N/A;   LEFT HEART CATH AND CORONARY ANGIOGRAPHY N/A 10/10/2019   Procedure: LEFT HEART CATH AND CORONARY ANGIOGRAPHY;  Surgeon: Troy Sine, MD;  Location: [place] [facility];  Service: Cardiovascular;  Laterality: N/A;   LUMBAR LAMINECTOMY/DECOMPRESSION MICRODISCECTOMY N/A 06/09/2015   Procedure: Laminectomy - T12-L1;  Surgeon: Eustace Moore, MD;  Location: [place] [facility];  Service: Neurosurgery;  Laterality: N/A;  Laminectomy - T12-L1   MEDIAL PARTIAL KNEE REPLACEMENT Bilateral 2009-2010    [facility]    MELANOMA EXCISION Right    "side of my nose"   SHOULDER SURGERY Left 1985   TOTAL SHOULDER ARTHROPLASTY Left 12/06/2012   Procedure: LEFT TOTAL SHOULDER ARTHROPLASTY VERSES A REVERSE TOTAL SHOULDER ARTHROPLASTY;  Surgeon: Augustin Schooling, MD;  Location: [place];  Service: Orthopedics;  Laterality: Left;    There were no vitals filed for this visit.   Subjective Assessment - 09/24/20 1000     Subjective COVID-19 screen performed prior to patient entering clinic.  Doing okay.    Patient is accompained by: Family member    Pertinent History CAD, coronary stent, chroinic low back pain, left TSA, right hand surgery, HTN, low back surgery, prior right knee surgery.    Patient Stated Goals Get around without pain.    Currently in Pain? Yes    Pain Score 5     Pain Location Knee    Pain Orientation Right    Pain Descriptors / Indicators Throbbing;Aching    Pain Type Surgical pain    Pain Onset More than a month ago                [facility] PT Assessment - 09/24/20 0001       PROM   PROM Assessment Site Knee    Right/Left Knee Right    Right Knee Flexion 110                           [facility] Adult PT Treatment/Exercise - 09/24/20 0001       Exercises   Exercises Knee/Hip       Knee/Hip Exercises: Aerobic   Recumbent Bike 15 minutes at seat 7 and 6 making 20 backward revolutions      Knee/Hip Exercises: Machines for Strengthening   Cybex Knee Extension 10# x 2 minutes    Cybex Knee Flexion 30# x 2 minutes    Cybex Leg Press 2 plates x 2 minutes.      Modalities   Modalities Teacher, English as a foreign language Location RT knee.    Electrical Stimulation Action IFC at 1-10 Hz.  Electrical Stimulation Parameters 40% scan x 20 minutes.    Electrical Stimulation Goals Edema;Pain      Vasopneumatic   Number Minutes Vasopneumatic  20 minutes    Vasopnuematic Location  Knee    Vasopneumatic Pressure Medium      Manual Therapy   Manual Therapy Passive ROM    Passive ROM In supine:  PROM to patient's right knee x 9 minutes into flexion and extension.                    PT Education - 09/24/20 1026     Education Details Instruct in supine and prone knee flexion stretch with towel.    Person(s) Educated Patient;Spouse    Methods Explanation;Demonstration    Comprehension Verbalized understanding                 PT Long Term Goals - 09/20/20 0931       PT LONG TERM GOAL #1   Title Independent with a HEP.    Time 4    Period Weeks    Status On-going      PT LONG TERM GOAL #2   Title Full active right knee extension in order to normalize gait.    Baseline AROM -15 degrees PROM -10 degrees 09/08/20    Period Weeks    Status On-going      PT LONG TERM GOAL #3   Title Active knee flexion to 115 degrees+ so the patient can perform functional tasks and do so with pain not > 2-3/10.    Baseline PROM 100 degrees (09/20/20).    Time 4    Period Weeks    Status On-going      PT LONG TERM GOAL #4   Title Increaseright  knee and hip strength to a solid 4+/5 to provide good stability for accomplishment of functional activities.    Time 4    Period Weeks    Status On-going      PT LONG TERM GOAL #5    Title Perform a reciprocating stair gait with one railing with pain not > 2-3/10.    Time 4    Period Weeks    Status On-going                   Plan - 09/24/20 1105     Clinical Impression Statement Patient was able to make 20 backward revolutions on recumbent bike.  He was able to achieve passive right knee flexion to 110 degrees today.  Encouraged patient (with wife present) to work hard on his HEP.  Added additional flexion stertches today to his HEP.    Personal Factors and Comorbidities Other;Comorbidity 1;Comorbidity 2    Comorbidities CAD, coronary stent, chroinic low back pain, left TSA, right hand surgery, HTN, low back surgery, prior right knee surgery.    Examination-Activity Limitations Other;Transfers;Locomotion Level;Stairs    Examination-Participation Restrictions Other    Stability/Clinical Decision Making Stable/Uncomplicated    Rehab Potential Excellent    PT Frequency 3x / week    PT Duration 4 weeks    PT Treatment/Interventions ADLs/Self Care Home Management;Cryotherapy;Electrical Stimulation;Moist Heat;Functional mobility Arts administrator;Therapeutic activities;Therapeutic exercise;Neuromuscular re-education;Manual techniques;Patient/family education;Passive range of motion;Vasopneumatic Device    PT Next Visit Plan Progress into TKA protocol.  Vasopneumatic.    Consulted and Agree with Plan of Care Patient;Family member/caregiver    Family Member Consulted Wife             Patient will benefit from  skilled therapeutic intervention in order to improve the following deficits and impairments:  Pain, Abnormal gait, Difficulty walking, Decreased activity tolerance, Decreased mobility, Decreased range of motion, Decreased strength, Increased edema  Visit Diagnosis: Muscle weakness (generalized) - Plan: PT plan of care cert/re-cert  Chronic pain of right knee - Plan: PT plan of care cert/re-cert  Localized edema - Plan: PT plan of  care cert/re-cert  Stiffness of right knee, not elsewhere classified - Plan: PT plan of care cert/re-cert     Problem List Patient Active Problem List   Diagnosis Date Noted   Pain due to unicompartmental arthroplasty of knee ([place]) 08/25/2020   Peripheral arterial disease ([place]) 11/19/2019   RBBB 01/16/2017   PUD (peptic ulcer disease) 01/16/2017   Melena 01/12/2017   Unstable angina ([place]) 01/11/2017   CAD S/P percutaneous coronary angioplasty 01/09/2017   Mixed hyperlipidemia 01/09/2017   DM type 2 causing vascular disease ([place]) 12/24/2016   Essential hypertension, benign 12/24/2016   Chest pain, rule out acute myocardial infarction 12/24/2016   ACS (acute coronary syndrome) [facility])    History of NSTEMI    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 [place], [place], [address] Phone: [phone]   Fax:  [phone]  Name: TABITHA BALLENGEE MRN: [id number] Date of Birth: 1940/05/11

## 2020-09-27 ENCOUNTER — Other Ambulatory Visit: Payer: Self-pay

## 2020-09-27 ENCOUNTER — Ambulatory Visit: Payer: Medicare Other | Admitting: Physical Therapy

## 2020-09-27 DIAGNOSIS — R600 Localized edema: Secondary | ICD-10-CM | POA: Diagnosis not present

## 2020-09-27 DIAGNOSIS — M25561 Pain in right knee: Secondary | ICD-10-CM | POA: Diagnosis not present

## 2020-09-27 DIAGNOSIS — M6281 Muscle weakness (generalized): Secondary | ICD-10-CM

## 2020-09-27 DIAGNOSIS — M25661 Stiffness of right knee, not elsewhere classified: Secondary | ICD-10-CM | POA: Diagnosis not present

## 2020-09-27 DIAGNOSIS — G8929 Other chronic pain: Secondary | ICD-10-CM

## 2020-09-27 NOTE — Therapy (Signed)
[facility] [place], [place], [address] Phone: [phone]   Fax:  [phone]  Physical Therapy Treatment  Patient Details  Name: PAMELA JANUSZ MRN: [id number] Date of Birth: [birth year] Referring Provider (PT): Gaynelle Arabian MD   Encounter Date: 09/27/2020   PT End of Session - 09/27/20 1007     Visit Number 13    Number of Visits 18    Date for PT Re-Evaluation 10/18/20    Authorization Type FOTO AT LEAST EVERY 5TH VISIT.  PROGRESS NOTE AT 10TH VISIT.  KX MODIFIER AFTER 15 VISITS.    PT Start Time 0900    PT Stop Time [phone]    PT Time Calculation (min) 52 min    Activity Tolerance Patient tolerated treatment well;Patient limited by pain    Behavior During Therapy [facility] for tasks assessed/performed             Past Medical History:  Diagnosis Date   Anticoagulated    plavix   Arthritis    Shoulder, knees, back    CAD in native artery cardiologist-  dr Angelena Form   a. CAD/NSTEMI ,  cardiac cath staged stenting-- 12-25-2016  PTCA and DES x3 to prox. and mid LAD;  12-26-2016  PCI to PLA and DES x1 to midRCA,  EF 60-65%.   Chronic lower back pain    CKD (chronic kidney disease), stage III (HCC)    Complication of anesthesia    "Too much with shoulder surgery", pt. reports that he was told the at they "lost him, due to absorbing too much anesthesia".  shoulder surgery 1985   DDD (degenerative disc disease), lumbosacral    GERD (gastroesophageal reflux disease)    History of gastric ulcer 01/2017   History of kidney stones    History of malignant melanoma    right side of nose   History of non-ST elevation myocardial infarction (NSTEMI) 12/23/2017   s/p  staged cardiac cath,  s/p  PCI and DEStenting   Hyperlipidemia    Hypertension    IDA (iron deficiency anemia)    Myocardial infarction ([place])    Nocturia    RBBB    Renal calculus, right    S/P drug eluting coronary stent placement 12-25-2017,  12-26-2017    PTCA and DES x3 to prox. and mid LAD;  PCI to PLA and DES x1 to midRCA   Type 2 diabetes mellitus treated with insulin ([place])    followed by pcp    Past Surgical History:  Procedure Laterality Date   ANAL FISSURE REPAIR  X 2   CATARACT EXTRACTION W/ INTRAOCULAR LENS  IMPLANT, BILATERAL Bilateral 2017;  2015   COLONOSCOPY WITH PROPOFOL N/A 06/05/2016   Procedure: COLONOSCOPY WITH PROPOFOL;  Surgeon: Garlan Fair, MD;  [facility];  Service: Endoscopy;  Laterality: N/A;   CONVERSION TO TOTAL KNEE Right 08/25/2020   Procedure: Revision right knee unicompartmental arthroplasty to total knee arthroplasty;  Surgeon: Gaynelle Arabian, MD;  [facility];  Service: Orthopedics;  Laterality: Right;   CORONARY ANGIOGRAPHY N/A 12/26/2016   Procedure: CORONARY ANGIOGRAPHY;  Surgeon: Troy Sine, MD;  Location: [place] [facility];  Service: Cardiovascular;  Laterality: N/A;   CORONARY STENT INTERVENTION N/A 12/25/2016   Procedure: CORONARY STENT INTERVENTION;  Surgeon: Burnell Blanks, MD;  Location: [place] [facility];  Service: Cardiovascular;  Laterality: N/A;   CORONARY STENT INTERVENTION N/A 12/26/2016   Procedure: CORONARY STENT INTERVENTION;  Surgeon: Troy Sine,  MD;  Location: [place] [facility];  Service: Cardiovascular;  Laterality: N/A;   CYSTOSCOPY/URETEROSCOPY/HOLMIUM LASER/STENT PLACEMENT Right 01/07/2018   Procedure: RIGHT URETEROSCOPY/HOLMIUM LASER/STENT PLACEMENT;  Surgeon: Lucas Mallow, MD;  Location: [facility];  Service: Urology;  Laterality: Right;   ESOPHAGOGASTRODUODENOSCOPY (EGD) WITH PROPOFOL N/A 01/12/2017   Procedure: ESOPHAGOGASTRODUODENOSCOPY (EGD) WITH PROPOFOL;  Surgeon: Wilford Corner, MD;  Location: [place];  Service: Endoscopy;  Laterality: N/A;   HAND TENDON SURGERY Right 10-29-2002   dr Burney Gauze '@[facility]'$    right index and thumb   KNEE ARTHROSCOPY Bilateral 2009-;2010   '@[facility]    LEFT HEART CATH AND CORONARY  ANGIOGRAPHY N/A 12/25/2016   Procedure: LEFT HEART CATH AND CORONARY ANGIOGRAPHY;  Surgeon: Burnell Blanks, MD;  Location: [facility];  Service: Cardiovascular;  Laterality: N/A;   LEFT HEART CATH AND CORONARY ANGIOGRAPHY N/A 10/10/2019   Procedure: LEFT HEART CATH AND CORONARY ANGIOGRAPHY;  Surgeon: Troy Sine, MD;  Location: [place] [facility];  Service: Cardiovascular;  Laterality: N/A;   LUMBAR LAMINECTOMY/DECOMPRESSION MICRODISCECTOMY N/A 06/09/2015   Procedure: Laminectomy - T12-L1;  Surgeon: Eustace Moore, MD;  Location: [place] [facility];  Service: Neurosurgery;  Laterality: N/A;  Laminectomy - T12-L1   MEDIAL PARTIAL KNEE REPLACEMENT Bilateral 2009-2010    [facility]    MELANOMA EXCISION Right    "side of my nose"   SHOULDER SURGERY Left 1985   TOTAL SHOULDER ARTHROPLASTY Left 12/06/2012   Procedure: LEFT TOTAL SHOULDER ARTHROPLASTY VERSES A REVERSE TOTAL SHOULDER ARTHROPLASTY;  Surgeon: Augustin Schooling, MD;  Location: [place];  Service: Orthopedics;  Laterality: Left;    There were no vitals filed for this visit.   Subjective Assessment - 09/27/20 1004     Subjective COVID-19 screen performed prior to patient entering clinic.  The patient and his wife stated he worked hard over the weekend on his HEP.  Afterwards he was in a lot of pain.    Patient is accompained by: Family member    Pertinent History CAD, coronary stent, chroinic low back pain, left TSA, right hand surgery, HTN, low back surgery, prior right knee surgery.    How long can you walk comfortably? Around home with a FWW.    Patient Stated Goals Get around without pain.    Currently in Pain? Yes    Pain Score 6     Pain Location Knee    Pain Orientation Right    Pain Descriptors / Indicators Throbbing;Aching    Pain Onset More than a month ago                [facility] PT Assessment - 09/27/20 0001       PROM   PROM Assessment Site Knee    Right/Left Knee Right    Right Knee Extension --   Within 5  degrees of left.   Right Knee Flexion 120                           [facility] Adult PT Treatment/Exercise - 09/27/20 0001       Exercises   Exercises Knee/Hip      Knee/Hip Exercises: Aerobic   Recumbent Bike 15 minutes for range of motion on seat 6.      Acupuncturist Location RT knee.    Electrical Stimulation Action IFC at 80-150 Hz. x 15 minutes.    Electrical Stimulation Goals Edema;Pain  Vasopneumatic   Number Minutes Vasopneumatic  15 minutes    Vasopnuematic Location  --   Right knee.   Vasopneumatic Pressure Medium      Manual Therapy   Manual Therapy Passive ROM    Passive ROM In supine:  PROM to patient tolerance x 10 minutes.                         PT Long Term Goals - 09/20/20 0931       PT LONG TERM GOAL #1   Title Independent with a HEP.    Time 4    Period Weeks    Status On-going      PT LONG TERM GOAL #2   Title Full active right knee extension in order to normalize gait.    Baseline AROM -15 degrees PROM -10 degrees 09/08/20    Period Weeks    Status On-going      PT LONG TERM GOAL #3   Title Active knee flexion to 115 degrees+ so the patient can perform functional tasks and do so with pain not > 2-3/10.    Baseline PROM 100 degrees (09/20/20).    Time 4    Period Weeks    Status On-going      PT LONG TERM GOAL #4   Title Increaseright  knee and hip strength to a solid 4+/5 to provide good stability for accomplishment of functional activities.    Time 4    Period Weeks    Status On-going      PT LONG TERM GOAL #5   Title Perform a reciprocating stair gait with one railing with pain not > 2-3/10.    Time 4    Period Weeks    Status On-going                   Plan - 09/27/20 1011     Clinical Impression Statement The patient has been working very hard in PT and per his wife is compliant to his HEP.  Today, he was able to make multiple revolutions on the  recumbent bike both backward and forward.  After stretching he was able to achieve right knee flexion to 120 degrees.    Personal Factors and Comorbidities Other;Comorbidity 1;Comorbidity 2    Comorbidities CAD, coronary stent, chroinic low back pain, left TSA, right hand surgery, HTN, low back surgery, prior right knee surgery.    Examination-Activity Limitations Other;Transfers;Locomotion Level;Stairs    Examination-Participation Restrictions Other    Stability/Clinical Decision Making Stable/Uncomplicated    Rehab Potential Excellent    PT Frequency 3x / week    PT Duration 4 weeks    PT Treatment/Interventions ADLs/Self Care Home Management;Cryotherapy;Electrical Stimulation;Moist Heat;Functional mobility Arts administrator;Therapeutic activities;Therapeutic exercise;Neuromuscular re-education;Manual techniques;Patient/family education;Passive range of motion;Vasopneumatic Device    PT Next Visit Plan Progress into TKA protocol.  Vasopneumatic.    Consulted and Agree with Plan of Care Patient;Family member/caregiver    Family Member Consulted Wife             Patient will benefit from skilled therapeutic intervention in order to improve the following deficits and impairments:  Pain, Abnormal gait, Difficulty walking, Decreased activity tolerance, Decreased mobility, Decreased range of motion, Decreased strength, Increased edema  Visit Diagnosis: Muscle weakness (generalized)  Chronic pain of right knee  Localized edema  Stiffness of right knee, not elsewhere classified     Problem List Patient Active Problem List   Diagnosis Date  Noted   Pain due to unicompartmental arthroplasty of knee ([place]) 08/25/2020   Peripheral arterial disease ([place]) 11/19/2019   RBBB 01/16/2017   PUD (peptic ulcer disease) 01/16/2017   Melena 01/12/2017   Unstable angina ([place]) 01/11/2017   CAD S/P percutaneous coronary angioplasty 01/09/2017   Mixed hyperlipidemia 01/09/2017    DM type 2 causing vascular disease ([place]) 12/24/2016   Essential hypertension, benign 12/24/2016   Chest pain, rule out acute myocardial infarction 12/24/2016   ACS (acute coronary syndrome) 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 [place], [place], [address] Phone: [phone]   Fax:  [phone]  Name: DIONTE FULLENKAMP MRN: [id number] Date of Birth: 1940-02-13

## 2020-09-28 DIAGNOSIS — Z471 Aftercare following joint replacement surgery: Secondary | ICD-10-CM | POA: Diagnosis not present

## 2020-09-28 DIAGNOSIS — Z96651 Presence of right artificial knee joint: Secondary | ICD-10-CM | POA: Diagnosis not present

## 2020-09-29 ENCOUNTER — Ambulatory Visit: Payer: Medicare Other | Admitting: Physical Therapy

## 2020-09-29 ENCOUNTER — Other Ambulatory Visit: Payer: Self-pay

## 2020-09-29 DIAGNOSIS — R600 Localized edema: Secondary | ICD-10-CM

## 2020-09-29 DIAGNOSIS — M6281 Muscle weakness (generalized): Secondary | ICD-10-CM

## 2020-09-29 DIAGNOSIS — G8929 Other chronic pain: Secondary | ICD-10-CM | POA: Diagnosis not present

## 2020-09-29 DIAGNOSIS — M25561 Pain in right knee: Secondary | ICD-10-CM | POA: Diagnosis not present

## 2020-09-29 DIAGNOSIS — M25661 Stiffness of right knee, not elsewhere classified: Secondary | ICD-10-CM

## 2020-09-29 NOTE — Therapy (Signed)
[facility] [place], [place], [address] Phone: [phone]   Fax:  [phone]  Physical Therapy Treatment  Patient Details  Name: Matthew [name] MRN: [id number] Date of Birth: 12-21-1940 Referring Provider (PT): Gaynelle Arabian MD   Encounter Date: 09/29/2020   PT End of Session - 09/29/20 0947     Visit Number 14    Number of Visits 18    Date for PT Re-Evaluation 10/18/20    Authorization Type FOTO AT LEAST EVERY 5TH VISIT.  PROGRESS NOTE AT 10TH VISIT.  KX MODIFIER AFTER 15 VISITS.    PT Start Time [phone]    PT Stop Time 0918    PT Time Calculation (min) 62 min    Activity Tolerance Patient tolerated treatment well;Patient limited by pain    Behavior During Therapy [facility] for tasks assessed/performed             Past Medical History:  Diagnosis Date   Anticoagulated    plavix   Arthritis    Shoulder, knees, back    CAD in native artery cardiologist-  dr Angelena Form   a. CAD/NSTEMI ,  cardiac cath staged stenting-- 12-25-2016  PTCA and DES x3 to prox. and mid LAD;  12-26-2016  PCI to PLA and DES x1 to midRCA,  EF 60-65%.   Chronic lower back pain    CKD (chronic kidney disease), stage III (HCC)    Complication of anesthesia    "Too much with shoulder surgery", pt. reports that he was told the at they "lost him, due to absorbing too much anesthesia".  shoulder surgery 1985   DDD (degenerative disc disease), lumbosacral    GERD (gastroesophageal reflux disease)    History of gastric ulcer 01/2017   History of kidney stones    History of malignant melanoma    right side of nose   History of non-ST elevation myocardial infarction (NSTEMI) 12/23/2017   s/p  staged cardiac cath,  s/p  PCI and DEStenting   Hyperlipidemia    Hypertension    IDA (iron deficiency anemia)    Myocardial infarction ([place])    Nocturia    RBBB    Renal calculus, right    S/P drug eluting coronary stent placement 12-25-2017,  12-26-2017    PTCA and DES x3 to prox. and mid LAD;  PCI to PLA and DES x1 to midRCA   Type 2 diabetes mellitus treated with insulin ([place])    followed by pcp    Past Surgical History:  Procedure Laterality Date   ANAL FISSURE REPAIR  X 2   CATARACT EXTRACTION W/ INTRAOCULAR LENS  IMPLANT, BILATERAL Bilateral 2017;  2015   COLONOSCOPY WITH PROPOFOL N/A 06/05/2016   Procedure: COLONOSCOPY WITH PROPOFOL;  Surgeon: Garlan Fair, MD;  [facility]: [facility];  Service: Endoscopy;  Laterality: N/A;   CONVERSION TO TOTAL KNEE Right 08/25/2020   Procedure: Revision right knee unicompartmental arthroplasty to total knee arthroplasty;  Surgeon: Gaynelle Arabian, MD;  [facility]: [facility];  Service: Orthopedics;  Laterality: Right;   CORONARY ANGIOGRAPHY N/A 12/26/2016   Procedure: CORONARY ANGIOGRAPHY;  Surgeon: Troy Sine, MD;  Location: [place] [facility];  Service: Cardiovascular;  Laterality: N/A;   CORONARY STENT INTERVENTION N/A 12/25/2016   Procedure: CORONARY STENT INTERVENTION;  Surgeon: Burnell Blanks, MD;  Location: [place] [facility];  Service: Cardiovascular;  Laterality: N/A;   CORONARY STENT INTERVENTION N/A 12/26/2016   Procedure: CORONARY STENT INTERVENTION;  Surgeon: Troy Sine,  MD;  Location: [place] [facility];  Service: Cardiovascular;  Laterality: N/A;   CYSTOSCOPY/URETEROSCOPY/HOLMIUM LASER/STENT PLACEMENT Right 01/07/2018   Procedure: RIGHT URETEROSCOPY/HOLMIUM LASER/STENT PLACEMENT;  Surgeon: Lucas Mallow, MD;  Location: [facility];  Service: Urology;  Laterality: Right;   ESOPHAGOGASTRODUODENOSCOPY (EGD) WITH PROPOFOL N/A 01/12/2017   Procedure: ESOPHAGOGASTRODUODENOSCOPY (EGD) WITH PROPOFOL;  Surgeon: Wilford Corner, MD;  Location: [place];  Service: Endoscopy;  Laterality: N/A;   HAND TENDON SURGERY Right 10-29-2002   dr Burney Gauze '@[facility]'$    right index and thumb   KNEE ARTHROSCOPY Bilateral 2009-;2010   '@[facility]'$    LEFT HEART CATH AND CORONARY  ANGIOGRAPHY N/A 12/25/2016   Procedure: LEFT HEART CATH AND CORONARY ANGIOGRAPHY;  Surgeon: Burnell Blanks, MD;  Location: [place] [facility];  Service: Cardiovascular;  Laterality: N/A;   LEFT HEART CATH AND CORONARY ANGIOGRAPHY N/A 10/10/2019   Procedure: LEFT HEART CATH AND CORONARY ANGIOGRAPHY;  Surgeon: Troy Sine, MD;  Location: [place] [facility];  Service: Cardiovascular;  Laterality: N/A;   LUMBAR LAMINECTOMY/DECOMPRESSION MICRODISCECTOMY N/A 06/09/2015   Procedure: Laminectomy - T12-L1;  Surgeon: Eustace Moore, MD;  Location: [place] [facility];  Service: Neurosurgery;  Laterality: N/A;  Laminectomy - T12-L1   MEDIAL PARTIAL KNEE REPLACEMENT Bilateral 2009-2010    [facility]    MELANOMA EXCISION Right    "side of my nose"   SHOULDER SURGERY Left 1985   TOTAL SHOULDER ARTHROPLASTY Left 12/06/2012   Procedure: LEFT TOTAL SHOULDER ARTHROPLASTY VERSES A REVERSE TOTAL SHOULDER ARTHROPLASTY;  Surgeon: Augustin Schooling, MD;  Location: [place];  Service: Orthopedics;  Laterality: Left;    There were no vitals filed for this visit.   Subjective Assessment - 09/29/20 0949     Subjective COVID-19 screen performed prior to patient entering clinic.  Patient and wife state MD appointment went well.  Doctor stated that due to his prior surgery and having to remove hardware prior to his TKA extended his surgery time and therefore he is about 2 weeks behind.    Patient is accompained by: Family member    Pertinent History CAD, coronary stent, chroinic low back pain, left TSA, right hand surgery, HTN, low back surgery, prior right knee surgery.    How long can you walk comfortably? Around home with a FWW.    Patient Stated Goals Get around without pain.    Currently in Pain? Yes    Pain Score 6     Pain Location Knee    Pain Orientation Right    Pain Descriptors / Indicators Aching;Throbbing    Pain Type Surgical pain    Pain Onset More than a month ago                                [facility] Adult PT Treatment/Exercise - 09/29/20 0001       Exercises   Exercises Knee/Hip      Knee/Hip Exercises: Aerobic   Recumbent Bike 14 minutes at seat 8.      Knee/Hip Exercises: Machines for Strengthening   Cybex Knee Extension 10# x 3 minutes.    Cybex Knee Flexion 30# x 3 minutes.    Cybex Leg Press 2 plates x 3 minutes.      Modalities   Modalities Health visitor Stimulation Location Right knee.    Electrical Stimulation Action IFC at 80-150 Hz x  15 minutes.    Electrical Stimulation Goals Edema;Pain      Vasopneumatic   Number Minutes Vasopneumatic  15 minutes    Vasopnuematic Location  --   Right knee.   Vasopneumatic Pressure Low      Manual Therapy   Manual Therapy Passive ROM    Passive ROM In supine:  PROM x 8 minutes to patient's right knee into flexion and extension.                         PT Long Term Goals - 09/20/20 0931       PT LONG TERM GOAL #1   Title Independent with a HEP.    Time 4    Period Weeks    Status On-going      PT LONG TERM GOAL #2   Title Full active right knee extension in order to normalize gait.    Baseline AROM -15 degrees PROM -10 degrees 09/08/20    Period Weeks    Status On-going      PT LONG TERM GOAL #3   Title Active knee flexion to 115 degrees+ so the patient can perform functional tasks and do so with pain not > 2-3/10.    Baseline PROM 100 degrees (09/20/20).    Time 4    Period Weeks    Status On-going      PT LONG TERM GOAL #4   Title Increaseright  knee and hip strength to a solid 4+/5 to provide good stability for accomplishment of functional activities.    Time 4    Period Weeks    Status On-going      PT LONG TERM GOAL #5   Title Perform a reciprocating stair gait with one railing with pain not > 2-3/10.    Time 4    Period Weeks    Status On-going                    Plan - 09/29/20 1007     Clinical Impression Statement The patient was able to make continuous revolutions on the recumbent bike today at seat 8.  He wife verfied he is being compliant to his HEP.    Personal Factors and Comorbidities Other;Comorbidity 1;Comorbidity 2    Comorbidities CAD, coronary stent, chroinic low back pain, left TSA, right hand surgery, HTN, low back surgery, prior right knee surgery.    Examination-Activity Limitations Other;Transfers;Locomotion Level;Stairs    Examination-Participation Restrictions Other    Stability/Clinical Decision Making Stable/Uncomplicated    Rehab Potential Excellent    PT Frequency 3x / week    PT Duration 4 weeks    PT Treatment/Interventions ADLs/Self Care Home Management;Cryotherapy;Electrical Stimulation;Moist Heat;Functional mobility Arts administrator;Therapeutic activities;Therapeutic exercise;Neuromuscular re-education;Manual techniques;Patient/family education;Passive range of motion;Vasopneumatic Device    PT Next Visit Plan Progress into TKA protocol.  Vasopneumatic.    Consulted and Agree with Plan of Care Patient;Family member/caregiver    Family Member Consulted Wife             Patient will benefit from skilled therapeutic intervention in order to improve the following deficits and impairments:  Pain, Abnormal gait, Difficulty walking, Decreased activity tolerance, Decreased mobility, Decreased range of motion, Decreased strength, Increased edema  Visit Diagnosis: Muscle weakness (generalized)  Chronic pain of right knee  Localized edema  Stiffness of right knee, not elsewhere classified     Problem List Patient Active Problem List   Diagnosis Date Noted  Pain due to unicompartmental arthroplasty of knee ([place]) 08/25/2020   Peripheral arterial disease ([place]) 11/19/2019   RBBB 01/16/2017   PUD (peptic ulcer disease) 01/16/2017   Melena 01/12/2017   Unstable angina ([place]) 01/11/2017   CAD S/P  percutaneous coronary angioplasty 01/09/2017   Mixed hyperlipidemia 01/09/2017   DM type 2 causing vascular disease ([place]) 12/24/2016   Essential hypertension, benign 12/24/2016   Chest pain, rule out acute myocardial infarction 12/24/2016   ACS (acute coronary syndrome) [facility])    History of NSTEMI    Myelopathy ([place]) 06/09/2015    Stephenia Vogan, [place] MPT 09/29/2020, 10:10 AM  [facility] [address] [place], [place], [address] Phone: [phone]   Fax:  [phone]  Name: Matthew [name] MRN: [id number] Date of Birth: 11-10-1940

## 2020-09-30 ENCOUNTER — Ambulatory Visit: Payer: Medicare Other | Admitting: Physical Therapy

## 2020-09-30 DIAGNOSIS — M25561 Pain in right knee: Secondary | ICD-10-CM

## 2020-09-30 DIAGNOSIS — R600 Localized edema: Secondary | ICD-10-CM

## 2020-09-30 DIAGNOSIS — M25661 Stiffness of right knee, not elsewhere classified: Secondary | ICD-10-CM | POA: Diagnosis not present

## 2020-09-30 DIAGNOSIS — G8929 Other chronic pain: Secondary | ICD-10-CM

## 2020-09-30 DIAGNOSIS — M6281 Muscle weakness (generalized): Secondary | ICD-10-CM

## 2020-09-30 NOTE — Therapy (Signed)
[facility] [place], [place], [address] Phone: [phone]   Fax:  [phone]  Physical Therapy Treatment  Patient Details  Name: Matthew [name] MRN: [id number] Date of Birth: 1940/11/12 Referring Provider (PT): Gaynelle Arabian MD   Encounter Date: 09/30/2020   PT End of Session - 09/30/20 1156     Visit Number 15    Number of Visits 18    Date for PT Re-Evaluation 10/18/20    Authorization Type FOTO AT LEAST EVERY 5TH VISIT.  PROGRESS NOTE AT 10TH VISIT.  KX MODIFIER AFTER 15 VISITS.    PT Start Time 1044    PT Stop Time 1132    PT Time Calculation (min) 48 min    Behavior During Therapy WFL for tasks assessed/performed             Past Medical History:  Diagnosis Date   Anticoagulated    plavix   Arthritis    Shoulder, knees, back    CAD in native artery cardiologist-  dr Angelena Form   a. CAD/NSTEMI ,  cardiac cath staged stenting-- 12-25-2016  PTCA and DES x3 to prox. and mid LAD;  12-26-2016  PCI to PLA and DES x1 to midRCA,  EF 60-65%.   Chronic lower back pain    CKD (chronic kidney disease), stage III (HCC)    Complication of anesthesia    "Too much with shoulder surgery", pt. reports that he was told the at they "lost him, due to absorbing too much anesthesia".  shoulder surgery 1985   DDD (degenerative disc disease), lumbosacral    GERD (gastroesophageal reflux disease)    History of gastric ulcer 01/2017   History of kidney stones    History of malignant melanoma    right side of nose   History of non-ST elevation myocardial infarction (NSTEMI) 12/23/2017   s/p  staged cardiac cath,  s/p  PCI and DEStenting   Hyperlipidemia    Hypertension    IDA (iron deficiency anemia)    Myocardial infarction ([place])    Nocturia    RBBB    Renal calculus, right    S/P drug eluting coronary stent placement 12-25-2017,  12-26-2017   PTCA and DES x3 to prox. and mid LAD;  PCI to PLA and DES x1 to midRCA   Type 2  diabetes mellitus treated with insulin ([place])    followed by pcp    Past Surgical History:  Procedure Laterality Date   ANAL FISSURE REPAIR  X 2   CATARACT EXTRACTION W/ INTRAOCULAR LENS  IMPLANT, BILATERAL Bilateral 2017;  2015   COLONOSCOPY WITH PROPOFOL N/A 06/05/2016   Procedure: COLONOSCOPY WITH PROPOFOL;  Surgeon: Garlan Fair, MD;  [facility];  Service: Endoscopy;  Laterality: N/A;   CONVERSION TO TOTAL KNEE Right 08/25/2020   Procedure: Revision right knee unicompartmental arthroplasty to total knee arthroplasty;  Surgeon: Gaynelle Arabian, MD;  [facility];  Service: Orthopedics;  Laterality: Right;   CORONARY ANGIOGRAPHY N/A 12/26/2016   Procedure: CORONARY ANGIOGRAPHY;  Surgeon: Troy Sine, MD;  Location: [place] [facility];  Service: Cardiovascular;  Laterality: N/A;   CORONARY STENT INTERVENTION N/A 12/25/2016   Procedure: CORONARY STENT INTERVENTION;  Surgeon: Burnell Blanks, MD;  Location: [place] [facility];  Service: Cardiovascular;  Laterality: N/A;   CORONARY STENT INTERVENTION N/A 12/26/2016   Procedure: CORONARY STENT INTERVENTION;  Surgeon: Troy Sine, MD;  Location: [place] [facility];  Service: Cardiovascular;  Laterality:  N/A;   CYSTOSCOPY/URETEROSCOPY/HOLMIUM LASER/STENT PLACEMENT Right 01/07/2018   Procedure: RIGHT URETEROSCOPY/HOLMIUM LASER/STENT PLACEMENT;  Surgeon: Lucas Mallow, MD;  Location: [facility];  Service: Urology;  Laterality: Right;   ESOPHAGOGASTRODUODENOSCOPY (EGD) WITH PROPOFOL N/A 01/12/2017   Procedure: ESOPHAGOGASTRODUODENOSCOPY (EGD) WITH PROPOFOL;  Surgeon: Wilford Corner, MD;  Location: [place];  Service: Endoscopy;  Laterality: N/A;   HAND TENDON SURGERY Right 10-29-2002   dr Burney Gauze '@[facility]'$    right index and thumb   KNEE ARTHROSCOPY Bilateral 2009-;2010   '@[facility]'$    LEFT HEART CATH AND CORONARY ANGIOGRAPHY N/A 12/25/2016   Procedure: LEFT HEART CATH AND CORONARY ANGIOGRAPHY;   Surgeon: Burnell Blanks, MD;  Location: [place] [facility];  Service: Cardiovascular;  Laterality: N/A;   LEFT HEART CATH AND CORONARY ANGIOGRAPHY N/A 10/10/2019   Procedure: LEFT HEART CATH AND CORONARY ANGIOGRAPHY;  Surgeon: Troy Sine, MD;  Location: [place] [facility];  Service: Cardiovascular;  Laterality: N/A;   LUMBAR LAMINECTOMY/DECOMPRESSION MICRODISCECTOMY N/A 06/09/2015   Procedure: Laminectomy - T12-L1;  Surgeon: Eustace Moore, MD;  Location: [place] [facility];  Service: Neurosurgery;  Laterality: N/A;  Laminectomy - T12-L1   MEDIAL PARTIAL KNEE REPLACEMENT Bilateral 2009-2010    [facility]    MELANOMA EXCISION Right    "side of my nose"   SHOULDER SURGERY Left 1985   TOTAL SHOULDER ARTHROPLASTY Left 12/06/2012   Procedure: LEFT TOTAL SHOULDER ARTHROPLASTY VERSES A REVERSE TOTAL SHOULDER ARTHROPLASTY;  Surgeon: Augustin Schooling, MD;  Location: [place];  Service: Orthopedics;  Laterality: Left;    There were no vitals filed for this visit.   Subjective Assessment - 09/30/20 1158     Subjective COVID-19 screen performed prior to patient entering clinic.  Patient states he was up early doing his HEP.  Pain lower today.    Patient is accompained by: Family member    Pertinent History CAD, coronary stent, chroinic low back pain, left TSA, right hand surgery, HTN, low back surgery, prior right knee surgery.    How long can you walk comfortably? Around home with a FWW.    Patient Stated Goals Get around without pain.    Currently in Pain? Yes    Pain Location Knee    Pain Orientation Right    Pain Descriptors / Indicators Aching;Throbbing    Pain Onset More than a month ago                               [facility] Adult PT Treatment/Exercise - 09/30/20 0001       Exercises   Exercises Knee/Hip      Knee/Hip Exercises: Aerobic   Recumbent Bike 16 minutes progressing to seat 4.      Knee/Hip Exercises: Machines for Strengthening   Cybex Knee Extension 10# x 3  minutes.    Cybex Knee Flexion 30# x 3 minutes.    Cybex Leg Press 2 plates x 3 minutes.      Modalities   Modalities Psychologist, educational Location Right knee    Electrical Stimulation Action IFC at 80-150 Hz. x 15 minutes.    Electrical Stimulation Goals Edema;Pain      Vasopneumatic   Number Minutes Vasopneumatic  15 minutes    Vasopnuematic Location  --   Right knee.   Vasopneumatic Pressure Low  PT Long Term Goals - 09/20/20 0931       PT LONG TERM GOAL #1   Title Independent with a HEP.    Time 4    Period Weeks    Status On-going      PT LONG TERM GOAL #2   Title Full active right knee extension in order to normalize gait.    Baseline AROM -15 degrees PROM -10 degrees 09/08/20    Period Weeks    Status On-going      PT LONG TERM GOAL #3   Title Active knee flexion to 115 degrees+ so the patient can perform functional tasks and do so with pain not > 2-3/10.    Baseline PROM 100 degrees (09/20/20).    Time 4    Period Weeks    Status On-going      PT LONG TERM GOAL #4   Title Increaseright  knee and hip strength to a solid 4+/5 to provide good stability for accomplishment of functional activities.    Time 4    Period Weeks    Status On-going      PT LONG TERM GOAL #5   Title Perform a reciprocating stair gait with one railing with pain not > 2-3/10.    Time 4    Period Weeks    Status On-going                   Plan - 09/30/20 1206     Clinical Impression Statement Excellent job today.  Patient presented to the clinic today wiht less pain and able to perform recumbent bike on seat 4.    Personal Factors and Comorbidities Other;Comorbidity 1;Comorbidity 2    Comorbidities CAD, coronary stent, chroinic low back pain, left TSA, right hand surgery, HTN, low back surgery, prior right knee surgery.    Examination-Activity Limitations  Other;Transfers;Locomotion Level;Stairs    Stability/Clinical Decision Making Stable/Uncomplicated    Rehab Potential Excellent    PT Frequency 3x / week    PT Duration 4 weeks    PT Next Visit Plan Progress into TKA protocol.  Vasopneumatic.    Consulted and Agree with Plan of Care Patient;Family member/caregiver             Patient will benefit from skilled therapeutic intervention in order to improve the following deficits and impairments:  Pain, Abnormal gait, Difficulty walking, Decreased activity tolerance, Decreased mobility, Decreased range of motion, Decreased strength, Increased edema  Visit Diagnosis: Muscle weakness (generalized)  Chronic pain of right knee  Localized edema  Stiffness of right knee, not elsewhere classified     Problem List Patient Active Problem List   Diagnosis Date Noted   Pain due to unicompartmental arthroplasty of knee ([place]) 08/25/2020   Peripheral arterial disease ([place]) 11/19/2019   RBBB 01/16/2017   PUD (peptic ulcer disease) 01/16/2017   Melena 01/12/2017   Unstable angina ([place]) 01/11/2017   CAD S/P percutaneous coronary angioplasty 01/09/2017   Mixed hyperlipidemia 01/09/2017   DM type 2 causing vascular disease ([place]) 12/24/2016   Essential hypertension, benign 12/24/2016   Chest pain, rule out acute myocardial infarction 12/24/2016   ACS (acute coronary syndrome) [facility])    History of NSTEMI    Myelopathy ([place]) 06/09/2015    Garmon Dehn, [place] MPT 09/30/2020, 12:07 PM  [facility] [address] [place], [place], [address] Phone: [phone]   Fax:  [phone]  Name: EVALD VALASEK MRN: [id number] Date of Birth: 1940/08/20

## 2020-10-04 ENCOUNTER — Other Ambulatory Visit: Payer: Self-pay

## 2020-10-04 ENCOUNTER — Ambulatory Visit: Payer: Medicare Other | Admitting: *Deleted

## 2020-10-04 DIAGNOSIS — M25661 Stiffness of right knee, not elsewhere classified: Secondary | ICD-10-CM

## 2020-10-04 DIAGNOSIS — G8929 Other chronic pain: Secondary | ICD-10-CM | POA: Diagnosis not present

## 2020-10-04 DIAGNOSIS — R600 Localized edema: Secondary | ICD-10-CM | POA: Diagnosis not present

## 2020-10-04 DIAGNOSIS — M6281 Muscle weakness (generalized): Secondary | ICD-10-CM

## 2020-10-04 DIAGNOSIS — M25561 Pain in right knee: Secondary | ICD-10-CM | POA: Diagnosis not present

## 2020-10-04 NOTE — Therapy (Signed)
[facility] [place], [place], [address] Phone: [phone]   Fax:  [phone]  Physical Therapy Treatment  Patient Details  Name: Matthew [name] MRN: [id number] Date of Birth: 10-29-1940 Referring Provider (PT): Gaynelle Arabian MD   Encounter Date: 10/04/2020   PT End of Session - 10/04/20 0837     Visit Number 16    Number of Visits 18    Date for PT Re-Evaluation 10/18/20    Authorization Type FOTO AT LEAST EVERY 5TH VISIT.  PROGRESS NOTE AT 10TH VISIT.  KX MODIFIER AFTER 15 VISITS.    PT Start Time 0815    PT Stop Time 0912    PT Time Calculation (min) 57 min             Past Medical History:  Diagnosis Date   Anticoagulated    plavix   Arthritis    Shoulder, knees, back    CAD in native artery cardiologist-  dr Angelena Form   a. CAD/NSTEMI ,  cardiac cath staged stenting-- 12-25-2016  PTCA and DES x3 to prox. and mid LAD;  12-26-2016  PCI to PLA and DES x1 to midRCA,  EF 60-65%.   Chronic lower back pain    CKD (chronic kidney disease), stage III (HCC)    Complication of anesthesia    "Too much with shoulder surgery", pt. reports that he was told the at they "lost him, due to absorbing too much anesthesia".  shoulder surgery 1985   DDD (degenerative disc disease), lumbosacral    GERD (gastroesophageal reflux disease)    History of gastric ulcer 01/2017   History of kidney stones    History of malignant melanoma    right side of nose   History of non-ST elevation myocardial infarction (NSTEMI) 12/23/2017   s/p  staged cardiac cath,  s/p  PCI and DEStenting   Hyperlipidemia    Hypertension    IDA (iron deficiency anemia)    Myocardial infarction ([place])    Nocturia    RBBB    Renal calculus, right    S/P drug eluting coronary stent placement 12-25-2017,  12-26-2017   PTCA and DES x3 to prox. and mid LAD;  PCI to PLA and DES x1 to midRCA   Type 2 diabetes mellitus treated with insulin ([place])    followed by  pcp    Past Surgical History:  Procedure Laterality Date   ANAL FISSURE REPAIR  X 2   CATARACT EXTRACTION W/ INTRAOCULAR LENS  IMPLANT, BILATERAL Bilateral 2017;  2015   COLONOSCOPY WITH PROPOFOL N/A 06/05/2016   Procedure: COLONOSCOPY WITH PROPOFOL;  Surgeon: Garlan Fair, MD;  [facility]: [facility];  Service: Endoscopy;  Laterality: N/A;   CONVERSION TO TOTAL KNEE Right 08/25/2020   Procedure: Revision right knee unicompartmental arthroplasty to total knee arthroplasty;  Surgeon: Gaynelle Arabian, MD;  [facility]: [facility];  Service: Orthopedics;  Laterality: Right;   CORONARY ANGIOGRAPHY N/A 12/26/2016   Procedure: CORONARY ANGIOGRAPHY;  Surgeon: Troy Sine, MD;  Location: [place] [facility];  Service: Cardiovascular;  Laterality: N/A;   CORONARY STENT INTERVENTION N/A 12/25/2016   Procedure: CORONARY STENT INTERVENTION;  Surgeon: Burnell Blanks, MD;  Location: [place] [facility];  Service: Cardiovascular;  Laterality: N/A;   CORONARY STENT INTERVENTION N/A 12/26/2016   Procedure: CORONARY STENT INTERVENTION;  Surgeon: Troy Sine, MD;  Location: [place] [facility];  Service: Cardiovascular;  Laterality: N/A;   CYSTOSCOPY/URETEROSCOPY/HOLMIUM LASER/STENT PLACEMENT Right 01/07/2018  Procedure: RIGHT URETEROSCOPY/HOLMIUM LASER/STENT PLACEMENT;  Surgeon: Lucas Mallow, MD;  Location: [facility];  Service: Urology;  Laterality: Right;   ESOPHAGOGASTRODUODENOSCOPY (EGD) WITH PROPOFOL N/A 01/12/2017   Procedure: ESOPHAGOGASTRODUODENOSCOPY (EGD) WITH PROPOFOL;  Surgeon: Wilford Corner, MD;  Location: [place];  Service: Endoscopy;  Laterality: N/A;   HAND TENDON SURGERY Right 10-29-2002   dr Burney Gauze '@[facility]'$    right index and thumb   KNEE ARTHROSCOPY Bilateral 2009-;2010   '@[facility]    LEFT HEART CATH AND CORONARY ANGIOGRAPHY N/A 12/25/2016   Procedure: LEFT HEART CATH AND CORONARY ANGIOGRAPHY;  Surgeon: Burnell Blanks, MD;  Location: [place] [facility];  Service: Cardiovascular;  Laterality: N/A;   LEFT HEART CATH AND CORONARY ANGIOGRAPHY N/A 10/10/2019   Procedure: LEFT HEART CATH AND CORONARY ANGIOGRAPHY;  Surgeon: Troy Sine, MD;  Location: [place] [facility];  Service: Cardiovascular;  Laterality: N/A;   LUMBAR LAMINECTOMY/DECOMPRESSION MICRODISCECTOMY N/A 06/09/2015   Procedure: Laminectomy - T12-L1;  Surgeon: Eustace Moore, MD;  Location: [place] [facility];  Service: Neurosurgery;  Laterality: N/A;  Laminectomy - T12-L1   MEDIAL PARTIAL KNEE REPLACEMENT Bilateral 2009-2010    [facility]    MELANOMA EXCISION Right    "side of my nose"   SHOULDER SURGERY Left 1985   TOTAL SHOULDER ARTHROPLASTY Left 12/06/2012   Procedure: LEFT TOTAL SHOULDER ARTHROPLASTY VERSES A REVERSE TOTAL SHOULDER ARTHROPLASTY;  Surgeon: Augustin Schooling, MD;  Location: [place];  Service: Orthopedics;  Laterality: Left;    There were no vitals filed for this visit.   Subjective Assessment - 10/04/20 0835     Subjective COVID-19 screen performed prior to patient entering clinic.  Pain today RT knee 3/10    Pertinent History CAD, coronary stent, chroinic low back pain, left TSA, right hand surgery, HTN, low back surgery, prior right knee surgery.    How long can you walk comfortably? Around home with a FWW.    Currently in Pain? Yes    Pain Score 6     Pain Location Knee    Pain Orientation Right    Pain Descriptors / Indicators Aching;Throbbing    Pain Type Surgical pain    Pain Onset More than a month ago                               [facility] Adult PT Treatment/Exercise - 10/04/20 0001       Exercises   Exercises Knee/Hip      Knee/Hip Exercises: Aerobic   Recumbent Bike 16 minutes progressing to seat 3 with focus on keeping RT ankle in DF.      Knee/Hip Exercises: Standing   Heel Raises --   unable to perform   Heel Raises Limitations toe ups 2x10    Forward Lunges Right;1 set;15 reps   focus on quad control   Lateral Step Up 2  sets;10 reps;Hand Hold: 2;Step Height: 4"    Forward Step Up 2 sets;10 reps;Hand Hold: 2;Step Height: 4"    Step Down Right;2 sets;10 reps;Hand Hold: 2;Step Height: 4"    Rocker Board 3 minutes                         PT Long Term Goals - 09/20/20 0931       PT LONG TERM GOAL #1   Title Independent with a HEP.    Time 4  Period Weeks    Status On-going      PT LONG TERM GOAL #2   Title Full active right knee extension in order to normalize gait.    Baseline AROM -15 degrees PROM -10 degrees 09/08/20    Period Weeks    Status On-going      PT LONG TERM GOAL #3   Title Active knee flexion to 115 degrees+ so the patient can perform functional tasks and do so with pain not > 2-3/10.    Baseline PROM 100 degrees (09/20/20).    Time 4    Period Weeks    Status On-going      PT LONG TERM GOAL #4   Title Increaseright  knee and hip strength to a solid 4+/5 to provide good stability for accomplishment of functional activities.    Time 4    Period Weeks    Status On-going      PT LONG TERM GOAL #5   Title Perform a reciprocating stair gait with one railing with pain not > 2-3/10.    Time 4    Period Weeks    Status On-going                   Plan - 10/04/20 1709     Clinical Impression Statement Pt arrived today doing fairly well and using SPC for gait. Rx focused on increased ROM and fuctional strengthening in //bars using steps. Pt unable to perform heel raises due to bil. calf weakness but able to perform in seated position. Normal modality response today    Personal Factors and Comorbidities Other;Comorbidity 1;Comorbidity 2    Comorbidities CAD, coronary stent, chroinic low back pain, left TSA, right hand surgery, HTN, low back surgery, prior right knee surgery.    Examination-Activity Limitations Other;Transfers;Locomotion Level;Stairs    Stability/Clinical Decision Making Stable/Uncomplicated    Rehab Potential Excellent    PT Frequency 3x / week     PT Duration 4 weeks    PT Treatment/Interventions ADLs/Self Care Home Management;Cryotherapy;Electrical Stimulation;Moist Heat;Functional mobility Arts administrator;Therapeutic activities;Therapeutic exercise;Neuromuscular re-education;Manual techniques;Patient/family education;Passive range of motion;Vasopneumatic Device    PT Next Visit Plan Progress into TKA protocol.  Vasopneumatic.    Consulted and Agree with Plan of Care Patient;Family member/caregiver             Patient will benefit from skilled therapeutic intervention in order to improve the following deficits and impairments:  Pain, Abnormal gait, Difficulty walking, Decreased activity tolerance, Decreased mobility, Decreased range of motion, Decreased strength, Increased edema  Visit Diagnosis: Muscle weakness (generalized)  Chronic pain of right knee  Localized edema  Stiffness of right knee, not elsewhere classified     Problem List Patient Active Problem List   Diagnosis Date Noted   Pain due to unicompartmental arthroplasty of knee ([place]) 08/25/2020   Peripheral arterial disease ([place]) 11/19/2019   RBBB 01/16/2017   PUD (peptic ulcer disease) 01/16/2017   Melena 01/12/2017   Unstable angina ([place]) 01/11/2017   CAD S/P percutaneous coronary angioplasty 01/09/2017   Mixed hyperlipidemia 01/09/2017   DM type 2 causing vascular disease ([place]) 12/24/2016   Essential hypertension, benign 12/24/2016   Chest pain, rule out acute myocardial infarction 12/24/2016   ACS (acute coronary syndrome) [facility])    History of NSTEMI    Myelopathy ([place]) 06/09/2015    Eboni Coval,CHRIS, PTA 10/04/2020, 5:15 PM  [facility] [place], [place], [address] Phone: [phone]   Fax:  [phone]  Name: Matthew [name] MRN: [id number] Date of Birth: [birth year]

## 2020-10-06 ENCOUNTER — Ambulatory Visit: Payer: Medicare Other | Admitting: Physical Therapy

## 2020-10-06 ENCOUNTER — Other Ambulatory Visit: Payer: Self-pay

## 2020-10-06 DIAGNOSIS — R600 Localized edema: Secondary | ICD-10-CM | POA: Diagnosis not present

## 2020-10-06 DIAGNOSIS — M6281 Muscle weakness (generalized): Secondary | ICD-10-CM

## 2020-10-06 DIAGNOSIS — M25561 Pain in right knee: Secondary | ICD-10-CM | POA: Diagnosis not present

## 2020-10-06 DIAGNOSIS — G8929 Other chronic pain: Secondary | ICD-10-CM | POA: Diagnosis not present

## 2020-10-06 DIAGNOSIS — M25661 Stiffness of right knee, not elsewhere classified: Secondary | ICD-10-CM | POA: Diagnosis not present

## 2020-10-06 NOTE — Therapy (Addendum)
[facility] [place], [place], [address] Phone: [phone]   Fax:  [phone]  Physical Therapy Treatment  Patient Details  Name: Matthew [name] MRN: [id number] Date of Birth: 06-05-1940 Referring Provider (PT): Gaynelle Arabian MD   Encounter Date: 10/06/2020   PT End of Session - 10/06/20 1509     Visit Number 17    Number of Visits 18    Date for PT Re-Evaluation 10/18/20    Authorization Type FOTO AT LEAST EVERY 5TH VISIT.  PROGRESS NOTE AT 10TH VISIT.  KX MODIFIER AFTER 15 VISITS.    PT Start Time 0145    PT Stop Time 0228    PT Time Calculation (min) 43 min    Behavior During Therapy [facility] for tasks assessed/performed             Past Medical History:  Diagnosis Date   Anticoagulated    plavix   Arthritis    Shoulder, knees, back    CAD in native artery cardiologist-  dr Angelena Form   a. CAD/NSTEMI ,  cardiac cath staged stenting-- 12-25-2016  PTCA and DES x3 to prox. and mid LAD;  12-26-2016  PCI to PLA and DES x1 to midRCA,  EF 60-65%.   Chronic lower back pain    CKD (chronic kidney disease), stage III (HCC)    Complication of anesthesia    "Too much with shoulder surgery", pt. reports that he was told the at they "lost him, due to absorbing too much anesthesia".  shoulder surgery 1985   DDD (degenerative disc disease), lumbosacral    GERD (gastroesophageal reflux disease)    History of gastric ulcer 01/2017   History of kidney stones    History of malignant melanoma    right side of nose   History of non-ST elevation myocardial infarction (NSTEMI) 12/23/2017   s/p  staged cardiac cath,  s/p  PCI and DEStenting   Hyperlipidemia    Hypertension    IDA (iron deficiency anemia)    Myocardial infarction ([place])    Nocturia    RBBB    Renal calculus, right    S/P drug eluting coronary stent placement 12-25-2017,  12-26-2017   PTCA and DES x3 to prox. and mid LAD;  PCI to PLA and DES x1 to midRCA   Type 2  diabetes mellitus treated with insulin ([place])    followed by pcp    Past Surgical History:  Procedure Laterality Date   ANAL FISSURE REPAIR  X 2   CATARACT EXTRACTION W/ INTRAOCULAR LENS  IMPLANT, BILATERAL Bilateral 2017;  2015   COLONOSCOPY WITH PROPOFOL N/A 06/05/2016   Procedure: COLONOSCOPY WITH PROPOFOL;  Surgeon: Garlan Fair, MD;  [facility];  Service: Endoscopy;  Laterality: N/A;   CONVERSION TO TOTAL KNEE Right 08/25/2020   Procedure: Revision right knee unicompartmental arthroplasty to total knee arthroplasty;  Surgeon: Gaynelle Arabian, MD;  [facility];  Service: Orthopedics;  Laterality: Right;   CORONARY ANGIOGRAPHY N/A 12/26/2016   Procedure: CORONARY ANGIOGRAPHY;  Surgeon: Troy Sine, MD;  Location: [place] [facility];  Service: Cardiovascular;  Laterality: N/A;   CORONARY STENT INTERVENTION N/A 12/25/2016   Procedure: CORONARY STENT INTERVENTION;  Surgeon: Burnell Blanks, MD;  Location: [place] [facility];  Service: Cardiovascular;  Laterality: N/A;   CORONARY STENT INTERVENTION N/A 12/26/2016   Procedure: CORONARY STENT INTERVENTION;  Surgeon: Troy Sine, MD;  Location: [place] [facility];  Service: Cardiovascular;  Laterality:  N/A;   CYSTOSCOPY/URETEROSCOPY/HOLMIUM LASER/STENT PLACEMENT Right 01/07/2018   Procedure: RIGHT URETEROSCOPY/HOLMIUM LASER/STENT PLACEMENT;  Surgeon: Lucas Mallow, MD;  Location[facility];  Service: Urology;  Laterality: Right;   ESOPHAGOGASTRODUODENOSCOPY (EGD) WITH PROPOFOL N/A 01/12/2017   Procedure: ESOPHAGOGASTRODUODENOSCOPY (EGD) WITH PROPOFOL;  Surgeon: Wilford Corner, MD;  Location: [place];  Service: Endoscopy;  Laterality: N/A;   HAND TENDON SURGERY Right 10-29-2002   dr Burney Gauze '@[facility]'$    right index and thumb   KNEE ARTHROSCOPY Bilateral 2009-;2010   '@[facility]    LEFT HEART CATH AND CORONARY ANGIOGRAPHY N/A 12/25/2016   Procedure: LEFT HEART CATH AND CORONARY ANGIOGRAPHY;   Surgeon: Burnell Blanks, MD;  Location: [place] [facility];  Service: Cardiovascular;  Laterality: N/A;   LEFT HEART CATH AND CORONARY ANGIOGRAPHY N/A 10/10/2019   Procedure: LEFT HEART CATH AND CORONARY ANGIOGRAPHY;  Surgeon: Troy Sine, MD;  Location: [place] [facility];  Service: Cardiovascular;  Laterality: N/A;   LUMBAR LAMINECTOMY/DECOMPRESSION MICRODISCECTOMY N/A 06/09/2015   Procedure: Laminectomy - T12-L1;  Surgeon: Eustace Moore, MD;  Location: [place] [facility];  Service: Neurosurgery;  Laterality: N/A;  Laminectomy - T12-L1   MEDIAL PARTIAL KNEE REPLACEMENT Bilateral 2009-2010    [facility]    MELANOMA EXCISION Right    "side of my nose"   SHOULDER SURGERY Left 1985   TOTAL SHOULDER ARTHROPLASTY Left 12/06/2012   Procedure: LEFT TOTAL SHOULDER ARTHROPLASTY VERSES A REVERSE TOTAL SHOULDER ARTHROPLASTY;  Surgeon: Augustin Schooling, MD;  Location: [place];  Service: Orthopedics;  Laterality: Left;    There were no vitals filed for this visit.   Subjective Assessment - 10/06/20 1353     Subjective COVID-19 screen performed prior to patient entering clinic. Patient presented to the clinic with a straight cane today.    Patient is accompained by: Family member    Pertinent History CAD, coronary stent, chroinic low back pain, left TSA, right hand surgery, HTN, low back surgery, prior right knee surgery.    How long can you walk comfortably? Around home with a FWW.    Patient Stated Goals Get around without pain.    Currently in Pain? Yes    Pain Score 5     Pain Location Knee    Pain Orientation Right    Pain Descriptors / Indicators Aching;Throbbing    Pain Type Surgical pain    Pain Onset More than a month ago                               [facility] Adult PT Treatment/Exercise - 10/06/20 0001       Exercises   Exercises Knee/Hip      Knee/Hip Exercises: Aerobic   Recumbent Bike 15 minutes progressing to seat 1.      Knee/Hip Exercises: Machines for  Strengthening   Cybex Knee Extension 10# x 3 minutes.      Knee/Hip Exercises: Standing   Other Standing Knee Exercises 4 inch forward step-ups x 2 minutes.    Other Standing Knee Exercises Wall slides to 60 degrees of flexion and attempting full extension x 1 minute.      Ankle Exercises: Standing   Rocker Board Limitations 2 minutes            Vasopneumatic x 15 minutes t patient's right knee on medium.             PT Long Term Goals - 09/20/20 [id number]  PT LONG TERM GOAL #1   Title Independent with a HEP.    Time 4    Period Weeks    Status On-going      PT LONG TERM GOAL #2   Title Full active right knee extension in order to normalize gait.    Baseline AROM -15 degrees PROM -10 degrees 09/08/20    Period Weeks    Status On-going      PT LONG TERM GOAL #3   Title Active knee flexion to 115 degrees+ so the patient can perform functional tasks and do so with pain not > 2-3/10.    Baseline PROM 100 degrees (09/20/20).    Time 4    Period Weeks    Status On-going      PT LONG TERM GOAL #4   Title Increaseright  knee and hip strength to a solid 4+/5 to provide good stability for accomplishment of functional activities.    Time 4    Period Weeks    Status On-going      PT LONG TERM GOAL #5   Title Perform a reciprocating stair gait with one railing with pain not > 2-3/10.    Time 4    Period Weeks    Status On-going                   Plan - 10/06/20 1506     Clinical Impression Statement Excellent job today.  Patient able to achieve seat one position on recumbent bike.  Added wall slides to 60 degrees of flexion and add him to focus on extension in the up phase of the exercise.    Personal Factors and Comorbidities Other;Comorbidity 1;Comorbidity 2    Comorbidities CAD, coronary stent, chroinic low back pain, left TSA, right hand surgery, HTN, low back surgery, prior right knee surgery.    Examination-Activity Limitations  Other;Transfers;Locomotion Level;Stairs    Examination-Participation Restrictions Other    Rehab Potential Excellent    PT Frequency 3x / week    PT Treatment/Interventions ADLs/Self Care Home Management;Cryotherapy;Electrical Stimulation;Moist Heat;Functional mobility Arts administrator;Therapeutic activities;Therapeutic exercise;Neuromuscular re-education;Manual techniques;Patient/family education;Passive range of motion;Vasopneumatic Device    PT Next Visit Plan Progress into TKA protocol.  Vasopneumatic.    Consulted and Agree with Plan of Care Patient;Family member/caregiver             Patient will benefit from skilled therapeutic intervention in order to improve the following deficits and impairments:  Pain, Abnormal gait, Difficulty walking, Decreased activity tolerance, Decreased mobility, Decreased range of motion, Decreased strength, Increased edema  Visit Diagnosis: Muscle weakness (generalized)  Chronic pain of right knee  Localized edema  Stiffness of right knee, not elsewhere classified     Problem List Patient Active Problem List   Diagnosis Date Noted   Pain due to unicompartmental arthroplasty of knee ([place]) 08/25/2020   Peripheral arterial disease ([place]) 11/19/2019   RBBB 01/16/2017   PUD (peptic ulcer disease) 01/16/2017   Melena 01/12/2017   Unstable angina ([place]) 01/11/2017   CAD S/P percutaneous coronary angioplasty 01/09/2017   Mixed hyperlipidemia 01/09/2017   DM type 2 causing vascular disease ([place]) 12/24/2016   Essential hypertension, benign 12/24/2016   Chest pain, rule out acute myocardial infarction 12/24/2016   ACS (acute coronary syndrome) [facility])    History of NSTEMI    Myelopathy ([place]) 06/09/2015    Jarrick Fjeld, [place] MPT 10/06/2020, 3:10 PM  [facility] [address] [place], [place], [address] Phone: [phone]  Fax:  [phone]  Name: Matthew [name] MRN:  [id number] Date of Birth: May 24, 1940

## 2020-10-12 ENCOUNTER — Other Ambulatory Visit: Payer: Self-pay

## 2020-10-12 ENCOUNTER — Ambulatory Visit: Payer: Medicare Other | Attending: Student | Admitting: Physical Therapy

## 2020-10-12 DIAGNOSIS — R600 Localized edema: Secondary | ICD-10-CM

## 2020-10-12 DIAGNOSIS — G8929 Other chronic pain: Secondary | ICD-10-CM | POA: Diagnosis not present

## 2020-10-12 DIAGNOSIS — M6281 Muscle weakness (generalized): Secondary | ICD-10-CM | POA: Insufficient documentation

## 2020-10-12 DIAGNOSIS — M25661 Stiffness of right knee, not elsewhere classified: Secondary | ICD-10-CM | POA: Diagnosis not present

## 2020-10-12 DIAGNOSIS — M25561 Pain in right knee: Secondary | ICD-10-CM | POA: Insufficient documentation

## 2020-10-12 NOTE — Therapy (Signed)
[facility] [place], [place], [address] Phone: [phone]   Fax:  [phone]  Physical Therapy Treatment  Patient Details  Name: Matthew [name] MRN: [id number] Date of Birth: 12-07-1940 Referring Provider (PT): Gaynelle Arabian MD   Encounter Date: 10/12/2020   PT End of Session - 10/12/20 0907     Visit Number 18    Number of Visits 24    Date for PT Re-Evaluation 11/01/20    Authorization Type FOTO AT LEAST EVERY 5TH VISIT.  PROGRESS NOTE AT 10TH VISIT.  KX MODIFIER AFTER 15 VISITS.    PT Start Time 0818    PT Stop Time 0910    PT Time Calculation (min) 52 min    Activity Tolerance Patient tolerated treatment well;Patient limited by pain    Behavior During Therapy [facility] for tasks assessed/performed             Past Medical History:  Diagnosis Date   Anticoagulated    plavix   Arthritis    Shoulder, knees, back    CAD in native artery cardiologist-  dr Angelena Form   a. CAD/NSTEMI ,  cardiac cath staged stenting-- 12-25-2016  PTCA and DES x3 to prox. and mid LAD;  12-26-2016  PCI to PLA and DES x1 to midRCA,  EF 60-65%.   Chronic lower back pain    CKD (chronic kidney disease), stage III (HCC)    Complication of anesthesia    "Too much with shoulder surgery", pt. reports that he was told the at they "lost him, due to absorbing too much anesthesia".  shoulder surgery 1985   DDD (degenerative disc disease), lumbosacral    GERD (gastroesophageal reflux disease)    History of gastric ulcer 01/2017   History of kidney stones    History of malignant melanoma    right side of nose   History of non-ST elevation myocardial infarction (NSTEMI) 12/23/2017   s/p  staged cardiac cath,  s/p  PCI and DEStenting   Hyperlipidemia    Hypertension    IDA (iron deficiency anemia)    Myocardial infarction ([place])    Nocturia    RBBB    Renal calculus, right    S/P drug eluting coronary stent placement 12-25-2017,  12-26-2017    PTCA and DES x3 to prox. and mid LAD;  PCI to PLA and DES x1 to midRCA   Type 2 diabetes mellitus treated with insulin ([place])    followed by pcp    Past Surgical History:  Procedure Laterality Date   ANAL FISSURE REPAIR  X 2   CATARACT EXTRACTION W/ INTRAOCULAR LENS  IMPLANT, BILATERAL Bilateral 2017;  2015   COLONOSCOPY WITH PROPOFOL N/A 06/05/2016   Procedure: COLONOSCOPY WITH PROPOFOL;  Surgeon: Garlan Fair, MD;  [facility];  Service: Endoscopy;  Laterality: N/A;   CONVERSION TO TOTAL KNEE Right 08/25/2020   Procedure: Revision right knee unicompartmental arthroplasty to total knee arthroplasty;  Surgeon: Gaynelle Arabian, MD;  [facility]: [facility];  Service: Orthopedics;  Laterality: Right;   CORONARY ANGIOGRAPHY N/A 12/26/2016   Procedure: CORONARY ANGIOGRAPHY;  Surgeon: Troy Sine, MD;  Location: [place] [facility];  Service: Cardiovascular;  Laterality: N/A;   CORONARY STENT INTERVENTION N/A 12/25/2016   Procedure: CORONARY STENT INTERVENTION;  Surgeon: Burnell Blanks, MD;  Location: [place] [facility];  Service: Cardiovascular;  Laterality: N/A;   CORONARY STENT INTERVENTION N/A 12/26/2016   Procedure: CORONARY STENT INTERVENTION;  Surgeon: Troy Sine,  MD;  Location: [place] [facility];  Service: Cardiovascular;  Laterality: N/A;   CYSTOSCOPY/URETEROSCOPY/HOLMIUM LASER/STENT PLACEMENT Right 01/07/2018   Procedure: RIGHT URETEROSCOPY/HOLMIUM LASER/STENT PLACEMENT;  Surgeon: Lucas Mallow, MD;  Location: [facility];  Service: Urology;  Laterality: Right;   ESOPHAGOGASTRODUODENOSCOPY (EGD) WITH PROPOFOL N/A 01/12/2017   Procedure: ESOPHAGOGASTRODUODENOSCOPY (EGD) WITH PROPOFOL;  Surgeon: Wilford Corner, MD;  Location: [place];  Service: Endoscopy;  Laterality: N/A;   HAND TENDON SURGERY Right 10-29-2002   dr Burney Gauze '@[facility]'$    right index and thumb   KNEE ARTHROSCOPY Bilateral 2009-;2010   '@[facility]    LEFT HEART CATH AND CORONARY  ANGIOGRAPHY N/A 12/25/2016   Procedure: LEFT HEART CATH AND CORONARY ANGIOGRAPHY;  Surgeon: Burnell Blanks, MD;  Location: [place] [facility];  Service: Cardiovascular;  Laterality: N/A;   LEFT HEART CATH AND CORONARY ANGIOGRAPHY N/A 10/10/2019   Procedure: LEFT HEART CATH AND CORONARY ANGIOGRAPHY;  Surgeon: Troy Sine, MD;  Location: [place] [facility];  Service: Cardiovascular;  Laterality: N/A;   LUMBAR LAMINECTOMY/DECOMPRESSION MICRODISCECTOMY N/A 06/09/2015   Procedure: Laminectomy - T12-L1;  Surgeon: Eustace Moore, MD;  Location: [place] [facility];  Service: Neurosurgery;  Laterality: N/A;  Laminectomy - T12-L1   MEDIAL PARTIAL KNEE REPLACEMENT Bilateral 2009-2010    [facility]    MELANOMA EXCISION Right    "side of my nose"   SHOULDER SURGERY Left 1985   TOTAL SHOULDER ARTHROPLASTY Left 12/06/2012   Procedure: LEFT TOTAL SHOULDER ARTHROPLASTY VERSES A REVERSE TOTAL SHOULDER ARTHROPLASTY;  Surgeon: Augustin Schooling, MD;  Location: [place];  Service: Orthopedics;  Laterality: Left;    There were no vitals filed for this visit.   Subjective Assessment - 10/12/20 0906     Subjective COVID-19 screen performed prior to patient entering clinic.  Been doing okay.    Patient is accompained by: Family member    Pertinent History CAD, coronary stent, chroinic low back pain, left TSA, right hand surgery, HTN, low back surgery, prior right knee surgery.    How long can you walk comfortably? Around home with a FWW.    Currently in Pain? Yes    Pain Score 4     Pain Location Knee    Pain Orientation Right    Pain Descriptors / Indicators Aching                               [facility] Adult PT Treatment/Exercise - 10/12/20 0001       Exercises   Exercises Knee/Hip      Knee/Hip Exercises: Aerobic   Recumbent Bike 17 minutes progressing to seat 1.      Knee/Hip Exercises: Machines for Strengthening   Cybex Knee Extension 10# x 3 minutes.    Cybex Knee Flexion 40# x 3  minutes    Cybex Leg Press 2.5 plates x 3 minutes.      Vasopneumatic   Number Minutes Vasopneumatic  15 minutes    Vasopnuematic Location  --   Right knee.   Vasopneumatic Pressure Medium      Manual Therapy   Manual Therapy Passive ROM    Passive ROM In supine:  Right knee extension stretch x 3 minutes.                         PT Long Term Goals - 09/20/20 0931       PT LONG  TERM GOAL #1   Title Independent with a HEP.    Time 4    Period Weeks    Status On-going      PT LONG TERM GOAL #2   Title Full active right knee extension in order to normalize gait.    Baseline AROM -15 degrees PROM -10 degrees 09/08/20    Period Weeks    Status On-going      PT LONG TERM GOAL #3   Title Active knee flexion to 115 degrees+ so the patient can perform functional tasks and do so with pain not > 2-3/10.    Baseline PROM 100 degrees (09/20/20).    Time 4    Period Weeks    Status On-going      PT LONG TERM GOAL #4   Title Increaseright  knee and hip strength to a solid 4+/5 to provide good stability for accomplishment of functional activities.    Time 4    Period Weeks    Status On-going      PT LONG TERM GOAL #5   Title Perform a reciprocating stair gait with one railing with pain not > 2-3/10.    Time 4    Period Weeks    Status On-going                   Plan - 10/12/20 0924     Clinical Impression Statement Outstanding job today.  He is walking with a cane.  Recommend supervised gait at this time.    Personal Factors and Comorbidities Other;Comorbidity 1;Comorbidity 2    Comorbidities CAD, coronary stent, chroinic low back pain, left TSA, right hand surgery, HTN, low back surgery, prior right knee surgery.    Examination-Activity Limitations Other;Transfers;Locomotion Level;Stairs    Examination-Participation Restrictions Other    Stability/Clinical Decision Making Stable/Uncomplicated    Rehab Potential Excellent    PT Duration 4 weeks    PT  Treatment/Interventions ADLs/Self Care Home Management;Cryotherapy;Electrical Stimulation;Moist Heat;Functional mobility Arts administrator;Therapeutic activities;Therapeutic exercise;Neuromuscular re-education;Manual techniques;Patient/family education;Passive range of motion;Vasopneumatic Device    PT Next Visit Plan Progress into TKA protocol.  Vasopneumatic.    Consulted and Agree with Plan of Care Patient;Family member/caregiver    Family Member Consulted Wife             Patient will benefit from skilled therapeutic intervention in order to improve the following deficits and impairments:  Pain, Abnormal gait, Difficulty walking, Decreased activity tolerance, Decreased mobility, Decreased range of motion, Decreased strength, Increased edema  Visit Diagnosis: Muscle weakness (generalized) - Plan: PT plan of care cert/re-cert  Chronic pain of right knee - Plan: PT plan of care cert/re-cert  Localized edema - Plan: PT plan of care cert/re-cert     Problem List Patient Active Problem List   Diagnosis Date Noted   Pain due to unicompartmental arthroplasty of knee ([place]) 08/25/2020   Peripheral arterial disease ([place]) 11/19/2019   RBBB 01/16/2017   PUD (peptic ulcer disease) 01/16/2017   Melena 01/12/2017   Unstable angina ([place]) 01/11/2017   CAD S/P percutaneous coronary angioplasty 01/09/2017   Mixed hyperlipidemia 01/09/2017   DM type 2 causing vascular disease ([place]) 12/24/2016   Essential hypertension, benign 12/24/2016   Chest pain, rule out acute myocardial infarction 12/24/2016   ACS (acute coronary syndrome) ([place])    History of NSTEMI    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

## 2020-10-13 ENCOUNTER — Other Ambulatory Visit: Payer: Self-pay

## 2020-10-13 ENCOUNTER — Encounter: Payer: Self-pay | Admitting: Physical Therapy

## 2020-10-13 ENCOUNTER — Ambulatory Visit: Payer: Medicare Other | Admitting: Physical Therapy

## 2020-10-13 DIAGNOSIS — M25561 Pain in right knee: Secondary | ICD-10-CM

## 2020-10-13 DIAGNOSIS — G8929 Other chronic pain: Secondary | ICD-10-CM

## 2020-10-13 DIAGNOSIS — M25661 Stiffness of right knee, not elsewhere classified: Secondary | ICD-10-CM

## 2020-10-13 DIAGNOSIS — R600 Localized edema: Secondary | ICD-10-CM

## 2020-10-13 DIAGNOSIS — M6281 Muscle weakness (generalized): Secondary | ICD-10-CM

## 2020-10-13 NOTE — Therapy (Signed)
[facility] [place], [place], [address] Phone: [phone]   Fax:  [phone]  Physical Therapy Treatment  Patient Details  Name: Matthew [name] MRN: [id number] Date of Birth: May 17, 1940 Referring Provider (PT): Gaynelle Arabian MD   Encounter Date: 10/13/2020   PT End of Session - 10/13/20 0945     Visit Number 19    Number of Visits 24    Date for PT Re-Evaluation 11/01/20    Authorization Type FOTO AT LEAST EVERY 5TH VISIT.  PROGRESS NOTE AT 10TH VISIT.  KX MODIFIER AFTER 15 VISITS.    PT Start Time [phone]    PT Stop Time 1031    PT Time Calculation (min) 44 min    Equipment Utilized During Treatment Other (comment)   SPC   Activity Tolerance Patient tolerated treatment well    Behavior During Therapy WFL for tasks assessed/performed             Past Medical History:  Diagnosis Date   Anticoagulated    plavix   Arthritis    Shoulder, knees, back    CAD in native artery cardiologist-  dr Angelena Form   a. CAD/NSTEMI ,  cardiac cath staged stenting-- 12-25-2016  PTCA and DES x3 to prox. and mid LAD;  12-26-2016  PCI to PLA and DES x1 to midRCA,  EF 60-65%.   Chronic lower back pain    CKD (chronic kidney disease), stage III (HCC)    Complication of anesthesia    "Too much with shoulder surgery", pt. reports that he was told the at they "lost him, due to absorbing too much anesthesia".  shoulder surgery 1985   DDD (degenerative disc disease), lumbosacral    GERD (gastroesophageal reflux disease)    History of gastric ulcer 01/2017   History of kidney stones    History of malignant melanoma    right side of nose   History of non-ST elevation myocardial infarction (NSTEMI) 12/23/2017   s/p  staged cardiac cath,  s/p  PCI and DEStenting   Hyperlipidemia    Hypertension    IDA (iron deficiency anemia)    Myocardial infarction ([place])    Nocturia    RBBB    Renal calculus, right    S/P drug eluting coronary stent  placement 12-25-2017,  12-26-2017   PTCA and DES x3 to prox. and mid LAD;  PCI to PLA and DES x1 to midRCA   Type 2 diabetes mellitus treated with insulin ([place])    followed by pcp    Past Surgical History:  Procedure Laterality Date   ANAL FISSURE REPAIR  X 2   CATARACT EXTRACTION W/ INTRAOCULAR LENS  IMPLANT, BILATERAL Bilateral 2017;  2015   COLONOSCOPY WITH PROPOFOL N/A 06/05/2016   Procedure: COLONOSCOPY WITH PROPOFOL;  Surgeon: Garlan Fair, MD;  [facility];  Service: Endoscopy;  Laterality: N/A;   CONVERSION TO TOTAL KNEE Right 08/25/2020   Procedure: Revision right knee unicompartmental arthroplasty to total knee arthroplasty;  Surgeon: Gaynelle Arabian, MD;  [facility];  Service: Orthopedics;  Laterality: Right;   CORONARY ANGIOGRAPHY N/A 12/26/2016   Procedure: CORONARY ANGIOGRAPHY;  Surgeon: Troy Sine, MD;  Location: [place] [facility];  Service: Cardiovascular;  Laterality: N/A;   CORONARY STENT INTERVENTION N/A 12/25/2016   Procedure: CORONARY STENT INTERVENTION;  Surgeon: Burnell Blanks, MD;  Location: [place] [facility];  Service: Cardiovascular;  Laterality: N/A;   CORONARY STENT INTERVENTION N/A 12/26/2016   Procedure:  CORONARY STENT INTERVENTION;  Surgeon: Troy Sine, MD;  Location: [place] [facility];  Service: Cardiovascular;  Laterality: N/A;   CYSTOSCOPY/URETEROSCOPY/HOLMIUM LASER/STENT PLACEMENT Right 01/07/2018   Procedure: RIGHT URETEROSCOPY/HOLMIUM LASER/STENT PLACEMENT;  Surgeon: Lucas Mallow, MD;  Location: [facility];  Service: Urology;  Laterality: Right;   ESOPHAGOGASTRODUODENOSCOPY (EGD) WITH PROPOFOL N/A 01/12/2017   Procedure: ESOPHAGOGASTRODUODENOSCOPY (EGD) WITH PROPOFOL;  Surgeon: Wilford Corner, MD;  Location: [place];  Service: Endoscopy;  Laterality: N/A;   HAND TENDON SURGERY Right 10-29-2002   dr Burney Gauze '@[facility]'$    right index and thumb   KNEE ARTHROSCOPY Bilateral 2009-;2010   '@[facility]     LEFT HEART CATH AND CORONARY ANGIOGRAPHY N/A 12/25/2016   Procedure: LEFT HEART CATH AND CORONARY ANGIOGRAPHY;  Surgeon: Burnell Blanks, MD;  Location: [place] [facility];  Service: Cardiovascular;  Laterality: N/A;   LEFT HEART CATH AND CORONARY ANGIOGRAPHY N/A 10/10/2019   Procedure: LEFT HEART CATH AND CORONARY ANGIOGRAPHY;  Surgeon: Troy Sine, MD;  Location: [place] [facility];  Service: Cardiovascular;  Laterality: N/A;   LUMBAR LAMINECTOMY/DECOMPRESSION MICRODISCECTOMY N/A 06/09/2015   Procedure: Laminectomy - T12-L1;  Surgeon: Eustace Moore, MD;  Location: [place] [facility];  Service: Neurosurgery;  Laterality: N/A;  Laminectomy - T12-L1   MEDIAL PARTIAL KNEE REPLACEMENT Bilateral 2009-2010    [facility]    MELANOMA EXCISION Right    "side of my nose"   SHOULDER SURGERY Left 1985   TOTAL SHOULDER ARTHROPLASTY Left 12/06/2012   Procedure: LEFT TOTAL SHOULDER ARTHROPLASTY VERSES A REVERSE TOTAL SHOULDER ARTHROPLASTY;  Surgeon: Augustin Schooling, MD;  Location: [place];  Service: Orthopedics;  Laterality: Left;    There were no vitals filed for this visit.   Subjective Assessment - 10/13/20 0944     Subjective COVID-19 screen performed prior to patient entering clinic. "Not hurting too bad today."    Pertinent History CAD, coronary stent, chroinic low back pain, left TSA, right hand surgery, HTN, low back surgery, prior right knee surgery.    How long can you walk comfortably? Around home with a FWW.    Patient Stated Goals Get around without pain.    Currently in Pain? Yes    Pain Score 3     Pain Location Knee    Pain Orientation Right    Pain Descriptors / Indicators Discomfort    Pain Type Surgical pain    Pain Onset More than a month ago    Pain Frequency Intermittent                [facility] PT Assessment - 10/13/20 0001       Assessment   Medical Diagnosis Right total knee replacement    Referring Provider (PT) Gaynelle Arabian MD    Onset Date/Surgical Date  08/25/20    Next MD Visit 10/26/2020      Precautions   Precaution Comments No ultrasound.      Restrictions   Weight Bearing Restrictions No                           [facility] Adult PT Treatment/Exercise - 10/13/20 0001       Knee/Hip Exercises: Aerobic   Recumbent Bike L3, seat 2 x16 min/ full revolutions      Knee/Hip Exercises: Machines for Strengthening   Cybex Knee Extension 20# 3x10 reps    Cybex Knee Flexion 40# 3x10 reps    Cybex Leg Press 2.5  pl, seat 6 x30 reps      Modalities   Modalities Vasopneumatic      Vasopneumatic   Number Minutes Vasopneumatic  10 minutes    Vasopnuematic Location  Knee    Vasopneumatic Pressure Medium                  Upper Extremity Functional Index Score :   /80        PT Long Term Goals - 09/20/20 0931       PT LONG TERM GOAL #1   Title Independent with a HEP.    Time 4    Period Weeks    Status On-going      PT LONG TERM GOAL #2   Title Full active right knee extension in order to normalize gait.    Baseline AROM -15 degrees PROM -10 degrees 09/08/20    Period Weeks    Status On-going      PT LONG TERM GOAL #3   Title Active knee flexion to 115 degrees+ so the patient can perform functional tasks and do so with pain not > 2-3/10.    Baseline PROM 100 degrees (09/20/20).    Time 4    Period Weeks    Status On-going      PT LONG TERM GOAL #4   Title Increaseright  knee and hip strength to a solid 4+/5 to provide good stability for accomplishment of functional activities.    Time 4    Period Weeks    Status On-going      PT LONG TERM GOAL #5   Title Perform a reciprocating stair gait with one railing with pain not > 2-3/10.    Time 4    Period Weeks    Status On-going                   Plan - 10/13/20 1034     Clinical Impression Statement Patient presented in clinic with minimal pain. Patient using a SPC for ambulation at this time. Patient able to tolerate increased  resistance on stationary bike as well as machinary. Patient continues to present with increased swelling of the R knee and observed in R knee flexion during stance of gait. Normal vasopneumatic response noted following removal of the modality.    Personal Factors and Comorbidities Other;Comorbidity 1;Comorbidity 2    Comorbidities CAD, coronary stent, chroinic low back pain, left TSA, right hand surgery, HTN, low back surgery, prior right knee surgery.    Examination-Activity Limitations Other;Transfers;Locomotion Level;Stairs    Examination-Participation Restrictions Other    Stability/Clinical Decision Making Stable/Uncomplicated    Rehab Potential Excellent    PT Frequency 3x / week    PT Duration 4 weeks    PT Treatment/Interventions ADLs/Self Care Home Management;Cryotherapy;Electrical Stimulation;Moist Heat;Functional mobility Arts administrator;Therapeutic activities;Therapeutic exercise;Neuromuscular re-education;Manual techniques;Patient/family education;Passive range of motion;Vasopneumatic Device    PT Next Visit Plan Progress into TKA protocol.  Vasopneumatic.    Consulted and Agree with Plan of Care Patient             Patient will benefit from skilled therapeutic intervention in order to improve the following deficits and impairments:  Pain, Abnormal gait, Difficulty walking, Decreased activity tolerance, Decreased mobility, Decreased range of motion, Decreased strength, Increased edema  Visit Diagnosis: Muscle weakness (generalized)  Chronic pain of right knee  Localized edema  Stiffness of right knee, not elsewhere classified     Problem List Patient Active Problem List   Diagnosis Date  Noted   Pain due to unicompartmental arthroplasty of knee ([place]) 08/25/2020   Peripheral arterial disease ([place]) 11/19/2019   RBBB 01/16/2017   PUD (peptic ulcer disease) 01/16/2017   Melena 01/12/2017   Unstable angina ([place]) 01/11/2017   CAD S/P percutaneous  coronary angioplasty 01/09/2017   Mixed hyperlipidemia 01/09/2017   DM type 2 causing vascular disease ([place]) 12/24/2016   Essential hypertension, benign 12/24/2016   Chest pain, rule out acute myocardial infarction 12/24/2016   ACS (acute coronary syndrome) [facility])    History of NSTEMI    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

## 2020-10-14 ENCOUNTER — Encounter: Payer: Medicare Other | Admitting: Physical Therapy

## 2020-10-18 ENCOUNTER — Other Ambulatory Visit: Payer: Self-pay

## 2020-10-18 ENCOUNTER — Ambulatory Visit: Payer: Medicare Other | Admitting: Physical Therapy

## 2020-10-18 DIAGNOSIS — R600 Localized edema: Secondary | ICD-10-CM

## 2020-10-18 DIAGNOSIS — M25661 Stiffness of right knee, not elsewhere classified: Secondary | ICD-10-CM

## 2020-10-18 DIAGNOSIS — G8929 Other chronic pain: Secondary | ICD-10-CM

## 2020-10-18 DIAGNOSIS — M25561 Pain in right knee: Secondary | ICD-10-CM | POA: Diagnosis not present

## 2020-10-18 DIAGNOSIS — M6281 Muscle weakness (generalized): Secondary | ICD-10-CM

## 2020-10-18 NOTE — Therapy (Signed)
[facility] [place], [place], [address] Phone: [phone]   Fax:  [phone]  Physical Therapy Treatment  Patient Details  Name: Matthew [name] MRN: [id number] Date of Birth: 1940-12-05 Referring Provider (PT): Gaynelle Arabian MD   Encounter Date: 10/18/2020   PT End of Session - 10/18/20 0925     Visit Number 20    Number of Visits 24    Date for PT Re-Evaluation 11/01/20    Authorization Type FOTO AT LEAST EVERY 5TH VISIT.  PROGRESS NOTE AT 10TH VISIT.  KX MODIFIER AFTER 15 VISITS.    PT Start Time [phone]    PT Stop Time 0908    PT Time Calculation (min) 51 min    Activity Tolerance Patient tolerated treatment well    Behavior During Therapy [facility] for tasks assessed/performed             Past Medical History:  Diagnosis Date   Anticoagulated    plavix   Arthritis    Shoulder, knees, back    CAD in native artery cardiologist-  dr Angelena Form   a. CAD/NSTEMI ,  cardiac cath staged stenting-- 12-25-2016  PTCA and DES x3 to prox. and mid LAD;  12-26-2016  PCI to PLA and DES x1 to midRCA,  EF 60-65%.   Chronic lower back pain    CKD (chronic kidney disease), stage III (HCC)    Complication of anesthesia    "Too much with shoulder surgery", pt. reports that he was told the at they "lost him, due to absorbing too much anesthesia".  shoulder surgery 1985   DDD (degenerative disc disease), lumbosacral    GERD (gastroesophageal reflux disease)    History of gastric ulcer 01/2017   History of kidney stones    History of malignant melanoma    right side of nose   History of non-ST elevation myocardial infarction (NSTEMI) 12/23/2017   s/p  staged cardiac cath,  s/p  PCI and DEStenting   Hyperlipidemia    Hypertension    IDA (iron deficiency anemia)    Myocardial infarction ([place])    Nocturia    RBBB    Renal calculus, right    S/P drug eluting coronary stent placement 12-25-2017,  12-26-2017   PTCA and DES x3 to prox.  and mid LAD;  PCI to PLA and DES x1 to midRCA   Type 2 diabetes mellitus treated with insulin ([place])    followed by pcp    Past Surgical History:  Procedure Laterality Date   ANAL FISSURE REPAIR  X 2   CATARACT EXTRACTION W/ INTRAOCULAR LENS  IMPLANT, BILATERAL Bilateral 2017;  2015   COLONOSCOPY WITH PROPOFOL N/A 06/05/2016   Procedure: COLONOSCOPY WITH PROPOFOL;  Surgeon: Garlan Fair, MD;  [facility]: [facility];  Service: Endoscopy;  Laterality: N/A;   CONVERSION TO TOTAL KNEE Right 08/25/2020   Procedure: Revision right knee unicompartmental arthroplasty to total knee arthroplasty;  Surgeon: Gaynelle Arabian, MD;  Location: [facility];  Service: Orthopedics;  Laterality: Right;   CORONARY ANGIOGRAPHY N/A 12/26/2016   Procedure: CORONARY ANGIOGRAPHY;  Surgeon: Troy Sine, MD;  Location: [place] [facility];  Service: Cardiovascular;  Laterality: N/A;   CORONARY STENT INTERVENTION N/A 12/25/2016   Procedure: CORONARY STENT INTERVENTION;  Surgeon: Burnell Blanks, MD;  Location: [place] [facility];  Service: Cardiovascular;  Laterality: N/A;   CORONARY STENT INTERVENTION N/A 12/26/2016   Procedure: CORONARY STENT INTERVENTION;  Surgeon: Troy Sine, MD;  Location:  [place] IN[facility];  Service: Cardiovascular;  Laterality: N/A;   CYSTOSCOPY/URETEROSCOPY/HOLMIUM LASER/STENT PLACEMENT Right 01/07/2018   Procedure: RIGHT URETEROSCOPY/HOLMIUM LASER/STENT PLACEMENT;  Surgeon: Lucas Mallow, MD;  Location: [facility];  Service: Urology;  Laterality: Right;   ESOPHAGOGASTRODUODENOSCOPY (EGD) WITH PROPOFOL N/A 01/12/2017   Procedure: ESOPHAGOGASTRODUODENOSCOPY (EGD) WITH PROPOFOL;  Surgeon: Wilford Corner, MD;  Location: [place];  Service: Endoscopy;  Laterality: N/A;   HAND TENDON SURGERY Right 10-29-2002   dr Burney Gauze _0    right index and thumb   KNEE ARTHROSCOPY Bilateral 2009-;2010   _1    LEFT HEART CATH AND CORONARY ANGIOGRAPHY N/A 12/25/2016    Procedure: LEFT HEART CATH AND CORONARY ANGIOGRAPHY;  Surgeon: Burnell Blanks, MD;  Location: [place] [facility];  Service: Cardiovascular;  Laterality: N/A;   LEFT HEART CATH AND CORONARY ANGIOGRAPHY N/A 10/10/2019   Procedure: LEFT HEART CATH AND CORONARY ANGIOGRAPHY;  Surgeon: Troy Sine, MD;  Location: [place] CV [facility];  Service: Cardiovascular;  Laterality: N/A;   LUMBAR LAMINECTOMY/DECOMPRESSION MICRODISCECTOMY N/A 06/09/2015   Procedure: Laminectomy - T12-L1;  Surgeon: Eustace Moore, MD;  Location: [place] [facility];  Service: Neurosurgery;  Laterality: N/A;  Laminectomy - T12-L1   MEDIAL PARTIAL KNEE REPLACEMENT Bilateral 2009-2010    [facility]    MELANOMA EXCISION Right    "side of my nose"   SHOULDER SURGERY Left 1985   TOTAL SHOULDER ARTHROPLASTY Left 12/06/2012   Procedure: LEFT TOTAL SHOULDER ARTHROPLASTY VERSES A REVERSE TOTAL SHOULDER ARTHROPLASTY;  Surgeon: Augustin Schooling, MD;  Location: [place];  Service: Orthopedics;  Laterality: Left;    There were no vitals filed for this visit.   Subjective Assessment - 10/18/20 0927     Subjective COVID-19 screen performed prior to patient entering clinic.  Patient pleased with progress.    Patient is accompained by: Family member    Pertinent History CAD, coronary stent, chroinic low back pain, left TSA, right hand surgery, HTN, low back surgery, prior right knee surgery.    How long can you walk comfortably? Around home with a FWW.    Patient Stated Goals Get around without pain.    Currently in Pain? Yes    Pain Location Knee    Pain Orientation Right    Pain Onset More than a month ago                [facility] PT Assessment - 10/18/20 0001       PROM   Right Knee Flexion 125                           [facility] Adult PT Treatment/Exercise - 10/18/20 0001       Exercises   Exercises Knee/Hip      Knee/Hip Exercises: Aerobic   Recumbent Bike Level 3 at seat 1 x 15 minutes.      Knee/Hip Exercises:  Machines for Strengthening   Cybex Knee Extension 10# x 3 minutes.    Cybex Knee Flexion 40# x 3 minutes.    Cybex Leg Press 2.5 plates x 3 minutes.      Vasopneumatic   Number Minutes Vasopneumatic  15 minutes    Vasopnuematic Location  --   Right knee.   Vasopneumatic Pressure Medium                          PT Long Term Goals - 10/18/20 7619  PT LONG TERM GOAL #1   Title Independent with a HEP.    Time 4    Period Weeks    Status Achieved      PT LONG TERM GOAL #2   Title Full active right knee extension in order to normalize gait.    Baseline AROM -15 degrees PROM -10 degrees 09/08/20    Period Weeks    Status On-going      PT LONG TERM GOAL #3   Title Active knee flexion to 115 degrees+ so the patient can perform functional tasks and do so with pain not > 2-3/10.    Baseline active 117 and passive to 125 degrees.    Time 4    Period Weeks    Status Achieved      PT LONG TERM GOAL #4   Title Increaseright  knee and hip strength to a solid 4+/5 to provide good stability for accomplishment of functional activities.    Time 4    Period Weeks    Status Achieved      PT LONG TERM GOAL #5   Time 4    Period Weeks    Status On-going                   Plan - 10/18/20 0935     Clinical Impression Statement See "Therapy Note" section.    Personal Factors and Comorbidities Other;Comorbidity 1;Comorbidity 2    Comorbidities CAD, coronary stent, chroinic low back pain, left TSA, right hand surgery, HTN, low back surgery, prior right knee surgery.    Examination-Activity Limitations Other;Transfers;Locomotion Level;Stairs    Stability/Clinical Decision Making Stable/Uncomplicated    Rehab Potential Excellent    PT Frequency 3x / week    PT Treatment/Interventions ADLs/Self Care Home Management;Cryotherapy;Electrical Stimulation;Moist Heat;Functional mobility Arts administrator;Therapeutic activities;Therapeutic  exercise;Neuromuscular re-education;Manual techniques;Patient/family education;Passive range of motion;Vasopneumatic Device    PT Next Visit Plan Progress into TKA protocol.  Vasopneumatic.    Consulted and Agree with Plan of Care Patient             Patient will benefit from skilled therapeutic intervention in order to improve the following deficits and impairments:     Visit Diagnosis: Muscle weakness (generalized)  Chronic pain of right knee  Localized edema  Stiffness of right knee, not elsewhere classified     Problem List Patient Active Problem List   Diagnosis Date Noted   Pain due to unicompartmental arthroplasty of knee ([place]) 08/25/2020   Peripheral arterial disease ([place]) 11/19/2019   RBBB 01/16/2017   PUD (peptic ulcer disease) 01/16/2017   Melena 01/12/2017   Unstable angina ([place]) 01/11/2017   CAD S/P percutaneous coronary angioplasty 01/09/2017   Mixed hyperlipidemia 01/09/2017   DM type 2 causing vascular disease ([place]) 12/24/2016   Essential hypertension, benign 12/24/2016   Chest pain, rule out acute myocardial infarction 12/24/2016   ACS (acute coronary syndrome) ([place])    History of NSTEMI    Myelopathy ([place]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 Excellent progress with passive right knee flexion to 125 degrees.  LTG's #1, 3 and 4 met.    Jyla Hopf, [place], PT 10/18/2020, 9:42 AM  [facility] [address] [place], [place], [address] Phone: [phone]   Fax:  [phone]  Name: Matthew [name] MRN: [id number] Date of Birth: 16-Nov-1940

## 2020-10-19 ENCOUNTER — Ambulatory Visit: Payer: Medicare Other | Admitting: "Endocrinology

## 2020-10-21 ENCOUNTER — Other Ambulatory Visit: Payer: Self-pay

## 2020-10-21 ENCOUNTER — Encounter: Payer: Self-pay | Admitting: Physical Therapy

## 2020-10-21 ENCOUNTER — Ambulatory Visit: Payer: Medicare Other | Admitting: Physical Therapy

## 2020-10-21 DIAGNOSIS — M6281 Muscle weakness (generalized): Secondary | ICD-10-CM | POA: Diagnosis not present

## 2020-10-21 DIAGNOSIS — R600 Localized edema: Secondary | ICD-10-CM

## 2020-10-21 DIAGNOSIS — M25661 Stiffness of right knee, not elsewhere classified: Secondary | ICD-10-CM

## 2020-10-21 DIAGNOSIS — H8111 Benign paroxysmal vertigo, right ear: Secondary | ICD-10-CM | POA: Diagnosis not present

## 2020-10-21 DIAGNOSIS — M25561 Pain in right knee: Secondary | ICD-10-CM | POA: Diagnosis not present

## 2020-10-21 DIAGNOSIS — G8929 Other chronic pain: Secondary | ICD-10-CM

## 2020-10-21 DIAGNOSIS — Z23 Encounter for immunization: Secondary | ICD-10-CM | POA: Diagnosis not present

## 2020-10-21 NOTE — Therapy (Signed)
[facility] [place], [place], [address] Phone: [phone]   Fax:  [phone]  Physical Therapy Treatment  Patient Details  Name: Matthew [name] MRN: [id number] Date of Birth: [birth year] Referring Provider (PT): Gaynelle Arabian MD   Encounter Date: 10/21/2020   PT End of Session - 10/21/20 0916     Visit Number 21    Number of Visits 24    Date for PT Re-Evaluation 11/01/20    Authorization Type FOTO AT LEAST EVERY 5TH VISIT.  PROGRESS NOTE AT 10TH VISIT.  KX MODIFIER AFTER 15 VISITS.    PT Start Time 0902    PT Stop Time 0952    PT Time Calculation (min) 50 min    Equipment Utilized During Treatment Other (comment)   SPC   Activity Tolerance Patient tolerated treatment well    Behavior During Therapy [facility] for tasks assessed/performed             Past Medical History:  Diagnosis Date   Anticoagulated    plavix   Arthritis    Shoulder, knees, back    CAD in native artery cardiologist-  dr Angelena Form   a. CAD/NSTEMI ,  cardiac cath staged stenting-- 12-25-2016  PTCA and DES x3 to prox. and mid LAD;  12-26-2016  PCI to PLA and DES x1 to midRCA,  EF 60-65%.   Chronic lower back pain    CKD (chronic kidney disease), stage III (HCC)    Complication of anesthesia    "Too much with shoulder surgery", pt. reports that he was told the at they "lost him, due to absorbing too much anesthesia".  shoulder surgery 1985   DDD (degenerative disc disease), lumbosacral    GERD (gastroesophageal reflux disease)    History of gastric ulcer 01/2017   History of kidney stones    History of malignant melanoma    right side of nose   History of non-ST elevation myocardial infarction (NSTEMI) 12/23/2017   s/p  staged cardiac cath,  s/p  PCI and DEStenting   Hyperlipidemia    Hypertension    IDA (iron deficiency anemia)    Myocardial infarction ([place])    Nocturia    RBBB    Renal calculus, right    S/P drug eluting coronary stent  placement 12-25-2017,  12-26-2017   PTCA and DES x3 to prox. and mid LAD;  PCI to PLA and DES x1 to midRCA   Type 2 diabetes mellitus treated with insulin ([place])    followed by pcp    Past Surgical History:  Procedure Laterality Date   ANAL FISSURE REPAIR  X 2   CATARACT EXTRACTION W/ INTRAOCULAR LENS  IMPLANT, BILATERAL Bilateral 2017;  2015   COLONOSCOPY WITH PROPOFOL N/A 06/05/2016   Procedure: COLONOSCOPY WITH PROPOFOL;  Surgeon: Garlan Fair, MD;  [facility];  Service: Endoscopy;  Laterality: N/A;   CONVERSION TO TOTAL KNEE Right 08/25/2020   Procedure: Revision right knee unicompartmental arthroplasty to total knee arthroplasty;  Surgeon: Gaynelle Arabian, MD;  [facility];  Service: Orthopedics;  Laterality: Right;   CORONARY ANGIOGRAPHY N/A 12/26/2016   Procedure: CORONARY ANGIOGRAPHY;  Surgeon: Troy Sine, MD;  Location: [place] CV LAB;  Service: Cardiovascular;  Laterality: N/A;   CORONARY STENT INTERVENTION N/A 12/25/2016   Procedure: CORONARY STENT INTERVENTION;  Surgeon: Burnell Blanks, MD;  Location: [place] CV LAB;  Service: Cardiovascular;  Laterality: N/A;   CORONARY STENT INTERVENTION N/A 12/26/2016   Procedure:  CORONARY STENT INTERVENTION;  Surgeon: Troy Sine, MD;  Location: [place] CV [facility];  Service: Cardiovascular;  Laterality: N/A;   CYSTOSCOPY/URETEROSCOPY/HOLMIUM LASER/STENT PLACEMENT Right 01/07/2018   Procedure: RIGHT URETEROSCOPY/HOLMIUM LASER/STENT PLACEMENT;  Surgeon: Lucas Mallow, MD;  Location: [facility];  Service: Urology;  Laterality: Right;   ESOPHAGOGASTRODUODENOSCOPY (EGD) WITH PROPOFOL N/A 01/12/2017   Procedure: ESOPHAGOGASTRODUODENOSCOPY (EGD) WITH PROPOFOL;  Surgeon: Wilford Corner, MD;  Location: [place];  Service: Endoscopy;  Laterality: N/A;   HAND TENDON SURGERY Right 10-29-2002   dr Burney Gauze '@[facility]'$    right index and thumb   KNEE ARTHROSCOPY Bilateral 2009-;2010   '@[facility]'$     LEFT HEART CATH AND CORONARY ANGIOGRAPHY N/A 12/25/2016   Procedure: LEFT HEART CATH AND CORONARY ANGIOGRAPHY;  Surgeon: Burnell Blanks, MD;  Location: [place] CV [facility];  Service: Cardiovascular;  Laterality: N/A;   LEFT HEART CATH AND CORONARY ANGIOGRAPHY N/A 10/10/2019   Procedure: LEFT HEART CATH AND CORONARY ANGIOGRAPHY;  Surgeon: Troy Sine, MD;  Location: [place] [facility];  Service: Cardiovascular;  Laterality: N/A;   LUMBAR LAMINECTOMY/DECOMPRESSION MICRODISCECTOMY N/A 06/09/2015   Procedure: Laminectomy - T12-L1;  Surgeon: Eustace Moore, MD;  Location: [place] [facility];  Service: Neurosurgery;  Laterality: N/A;  Laminectomy - T12-L1   MEDIAL PARTIAL KNEE REPLACEMENT Bilateral 2009-2010    [facility]    MELANOMA EXCISION Right    "side of my nose"   SHOULDER SURGERY Left 1985   TOTAL SHOULDER ARTHROPLASTY Left 12/06/2012   Procedure: LEFT TOTAL SHOULDER ARTHROPLASTY VERSES A REVERSE TOTAL SHOULDER ARTHROPLASTY;  Surgeon: Augustin Schooling, MD;  Location: [place];  Service: Orthopedics;  Laterality: Left;    There were no vitals filed for this visit.   Subjective Assessment - 10/21/20 0915     Subjective COVID-19 screen performed prior to patient entering clinic.  Patient denies any knee issues but does have back pain today.    Pertinent History CAD, coronary stent, chroinic low back pain, left TSA, right hand surgery, HTN, low back surgery, prior right knee surgery.    How long can you walk comfortably? Around home with a FWW.    Patient Stated Goals Get around without pain.    Currently in Pain? Yes    Pain Score 2     Pain Location Knee    Pain Orientation Right    Pain Descriptors / Indicators Discomfort    Pain Type Surgical pain    Pain Onset More than a month ago    Pain Frequency Intermittent                [facility] PT Assessment - 10/21/20 0001       Assessment   Medical Diagnosis Right total knee replacement    Referring Provider (PT) Gaynelle Arabian MD     Onset Date/Surgical Date 08/25/20    Next MD Visit 10/26/2020      Precautions   Precaution Comments No ultrasound.      Restrictions   Weight Bearing Restrictions No      ROM / Strength   AROM / PROM / Strength AROM      AROM   Overall AROM  Deficits;Within functional limits for tasks performed    AROM Assessment Site Knee    Right/Left Knee Right    Right Knee Extension 25   21 deg of knee extension   Right Knee Flexion 115  [place] Adult PT Treatment/Exercise - 10/21/20 0001       Knee/Hip Exercises: Stretches   Passive Hamstring Stretch Right;3 reps;10 seconds      Knee/Hip Exercises: Aerobic   Recumbent Bike L4-5, seat 1 x15 min      Knee/Hip Exercises: Machines for Strengthening   Cybex Knee Extension 20# 3x10 reps    Cybex Knee Flexion 40# 3x10 reps      Knee/Hip Exercises: Standing   Forward Lunges Right;20 reps;3 seconds;Limitations    Forward Lunges Limitations off 8" step      Knee/Hip Exercises: Supine   Heel Prop for Knee Extension 5 minutes;Weight    Heel Prop for Knee Extension Weight (lbs) 4      Modalities   Modalities Vasopneumatic      Vasopneumatic   Number Minutes Vasopneumatic  10 minutes    Vasopnuematic Location  Knee    Vasopneumatic Pressure Medium    Vasopneumatic Temperature  34 degrees for edema                          PT Long Term Goals - 10/18/20 0926       PT LONG TERM GOAL #1   Title Independent with a HEP.    Time 4    Period Weeks    Status Achieved      PT LONG TERM GOAL #2   Title Full active right knee extension in order to normalize gait.    Baseline AROM -15 degrees PROM -10 degrees 09/08/20    Period Weeks    Status On-going      PT LONG TERM GOAL #3   Title Active knee flexion to 115 degrees+ so the patient can perform functional tasks and do so with pain not > 2-3/10.    Baseline active 117 and passive to 125 degrees.    Time 4    Period Weeks     Status Achieved      PT LONG TERM GOAL #4   Title Increaseright  knee and hip strength to a solid 4+/5 to provide good stability for accomplishment of functional activities.    Time 4    Period Weeks    Status Achieved      PT LONG TERM GOAL #5   Time 4    Period Weeks    Status On-going                   Plan - 10/21/20 1117     Clinical Impression Statement Patient presented in clinic with reports of more LBP today. Patient able to tolerate therex and stretching without complaint. Prior to passive knee extension with weight, AROM R knee extension is -25 deg. Following knee extension stretching, AROM R knee flexion measured as -21 deg. Normal vasopneumatic response noted following removal of the modality.    Personal Factors and Comorbidities Other;Comorbidity 1;Comorbidity 2    Comorbidities CAD, coronary stent, chroinic low back pain, left TSA, right hand surgery, HTN, low back surgery, prior right knee surgery.    Examination-Activity Limitations Other;Transfers;Locomotion Level;Stairs    Examination-Participation Restrictions Other    Stability/Clinical Decision Making Stable/Uncomplicated    Rehab Potential Excellent    PT Frequency 3x / week    PT Duration 4 weeks    PT Treatment/Interventions ADLs/Self Care Home Management;Cryotherapy;Electrical Stimulation;Moist Heat;Functional mobility Arts administrator;Therapeutic activities;Therapeutic exercise;Neuromuscular re-education;Manual techniques;Patient/family education;Passive range of motion;Vasopneumatic Device    PT Next Visit Plan Progress into TKA  protocol.  Vasopneumatic.    Consulted and Agree with Plan of Care Patient             Patient will benefit from skilled therapeutic intervention in order to improve the following deficits and impairments:  Pain, Abnormal gait, Difficulty walking, Decreased activity tolerance, Decreased mobility, Decreased range of motion, Decreased strength,  Increased edema  Visit Diagnosis: Muscle weakness (generalized)  Chronic pain of right knee  Localized edema  Stiffness of right knee, not elsewhere classified     Problem List Patient Active Problem List   Diagnosis Date Noted   Pain due to unicompartmental arthroplasty of knee ([place]) 08/25/2020   Peripheral arterial disease ([place]) 11/19/2019   RBBB 01/16/2017   PUD (peptic ulcer disease) 01/16/2017   Melena 01/12/2017   Unstable angina ([place]) 01/11/2017   CAD S/P percutaneous coronary angioplasty 01/09/2017   Mixed hyperlipidemia 01/09/2017   DM type 2 causing vascular disease ([place]) 12/24/2016   Essential hypertension, benign 12/24/2016   Chest pain, rule out acute myocardial infarction 12/24/2016   ACS (acute coronary syndrome) [facility])    History of NSTEMI    Myelopathy ([place]) 06/09/2015    Standley Brooking, PTA 10/21/2020, 11:20 AM  [facility] [address] [place], [place], [address] Phone: [phone]   Fax:  [phone]  Name: Matthew [name] MRN: [id number] Date of Birth: 1940/10/31

## 2020-10-25 ENCOUNTER — Other Ambulatory Visit: Payer: Self-pay

## 2020-10-25 ENCOUNTER — Ambulatory Visit: Payer: Medicare Other | Admitting: Physical Therapy

## 2020-10-25 DIAGNOSIS — M25661 Stiffness of right knee, not elsewhere classified: Secondary | ICD-10-CM | POA: Diagnosis not present

## 2020-10-25 DIAGNOSIS — R600 Localized edema: Secondary | ICD-10-CM | POA: Diagnosis not present

## 2020-10-25 DIAGNOSIS — M25561 Pain in right knee: Secondary | ICD-10-CM | POA: Diagnosis not present

## 2020-10-25 DIAGNOSIS — G8929 Other chronic pain: Secondary | ICD-10-CM | POA: Diagnosis not present

## 2020-10-25 DIAGNOSIS — M6281 Muscle weakness (generalized): Secondary | ICD-10-CM

## 2020-10-25 NOTE — Therapy (Signed)
[facility] [place], [place], [address] Phone: [phone]   Fax:  [phone]  Physical Therapy Treatment  Patient Details  Name: Matthew [name] MRN: [id number] Date of Birth: August 19, 1940 Referring Provider (PT): Gaynelle Arabian MD   Encounter Date: 10/25/2020   PT End of Session - 10/25/20 0854     Visit Number 22    Number of Visits 24    Date for PT Re-Evaluation 11/01/20    Authorization Type FOTO AT LEAST EVERY 5TH VISIT.  PROGRESS NOTE AT 10TH VISIT.  KX MODIFIER AFTER 15 VISITS.    PT Start Time 0815    PT Stop Time 0904    PT Time Calculation (min) 49 min    Activity Tolerance Patient tolerated treatment well    Behavior During Therapy [facility] for tasks assessed/performed             Past Medical History:  Diagnosis Date   Anticoagulated    plavix   Arthritis    Shoulder, knees, back    CAD in native artery cardiologist-  dr Angelena Form   a. CAD/NSTEMI ,  cardiac cath staged stenting-- 12-25-2016  PTCA and DES x3 to prox. and mid LAD;  12-26-2016  PCI to PLA and DES x1 to midRCA,  EF 60-65%.   Chronic lower back pain    CKD (chronic kidney disease), stage III (HCC)    Complication of anesthesia    "Too much with shoulder surgery", pt. reports that he was told the at they "lost him, due to absorbing too much anesthesia".  shoulder surgery 1985   DDD (degenerative disc disease), lumbosacral    GERD (gastroesophageal reflux disease)    History of gastric ulcer 01/2017   History of kidney stones    History of malignant melanoma    right side of nose   History of non-ST elevation myocardial infarction (NSTEMI) 12/23/2017   s/p  staged cardiac cath,  s/p  PCI and DEStenting   Hyperlipidemia    Hypertension    IDA (iron deficiency anemia)    Myocardial infarction ([place])    Nocturia    RBBB    Renal calculus, right    S/P drug eluting coronary stent placement 12-25-2017,  12-26-2017   PTCA and DES x3 to prox.  and mid LAD;  PCI to PLA and DES x1 to midRCA   Type 2 diabetes mellitus treated with insulin ([place])    followed by pcp    Past Surgical History:  Procedure Laterality Date   ANAL FISSURE REPAIR  X 2   CATARACT EXTRACTION W/ INTRAOCULAR LENS  IMPLANT, BILATERAL Bilateral 2017;  2015   COLONOSCOPY WITH PROPOFOL N/A 06/05/2016   Procedure: COLONOSCOPY WITH PROPOFOL;  Surgeon: Garlan Fair, MD;  [facility]: [facility];  Service: Endoscopy;  Laterality: N/A;   CONVERSION TO TOTAL KNEE Right 08/25/2020   Procedure: Revision right knee unicompartmental arthroplasty to total knee arthroplasty;  Surgeon: Gaynelle Arabian, MD;  [facility]: [facility];  Service: Orthopedics;  Laterality: Right;   CORONARY ANGIOGRAPHY N/A 12/26/2016   Procedure: CORONARY ANGIOGRAPHY;  Surgeon: Troy Sine, MD;  Location: [place] [facility];  Service: Cardiovascular;  Laterality: N/A;   CORONARY STENT INTERVENTION N/A 12/25/2016   Procedure: CORONARY STENT INTERVENTION;  Surgeon: Burnell Blanks, MD;  Location: [place] [facility];  Service: Cardiovascular;  Laterality: N/A;   CORONARY STENT INTERVENTION N/A 12/26/2016   Procedure: CORONARY STENT INTERVENTION;  Surgeon: Troy Sine, MD;  Location:  [place] [facility];  Service: Cardiovascular;  Laterality: N/A;   CYSTOSCOPY/URETEROSCOPY/HOLMIUM LASER/STENT PLACEMENT Right 01/07/2018   Procedure: RIGHT URETEROSCOPY/HOLMIUM LASER/STENT PLACEMENT;  Surgeon: Lucas Mallow, MD;  Location: [facility];  Service: Urology;  Laterality: Right;   ESOPHAGOGASTRODUODENOSCOPY (EGD) WITH PROPOFOL N/A 01/12/2017   Procedure: ESOPHAGOGASTRODUODENOSCOPY (EGD) WITH PROPOFOL;  Surgeon: Wilford Corner, MD;  Location: [place];  Service: Endoscopy;  Laterality: N/A;   HAND TENDON SURGERY Right 10-29-2002   dr Burney Gauze '@[facility]'$    right index and thumb   KNEE ARTHROSCOPY Bilateral 2009-;2010   '@[facility]'$    LEFT HEART CATH AND CORONARY ANGIOGRAPHY N/A 12/25/2016    Procedure: LEFT HEART CATH AND CORONARY ANGIOGRAPHY;  Surgeon: Burnell Blanks, MD;  Location: [place] CV [facility];  Service: Cardiovascular;  Laterality: N/A;   LEFT HEART CATH AND CORONARY ANGIOGRAPHY N/A 10/10/2019   Procedure: LEFT HEART CATH AND CORONARY ANGIOGRAPHY;  Surgeon: Troy Sine, MD;  Location: [place] CV [facility];  Service: Cardiovascular;  Laterality: N/A;   LUMBAR LAMINECTOMY/DECOMPRESSION MICRODISCECTOMY N/A 06/09/2015   Procedure: Laminectomy - T12-L1;  Surgeon: Eustace Moore, MD;  Location: [place] [facility];  Service: Neurosurgery;  Laterality: N/A;  Laminectomy - T12-L1   MEDIAL PARTIAL KNEE REPLACEMENT Bilateral 2009-2010    [facility]    MELANOMA EXCISION Right    "side of my nose"   SHOULDER SURGERY Left 1985   TOTAL SHOULDER ARTHROPLASTY Left 12/06/2012   Procedure: LEFT TOTAL SHOULDER ARTHROPLASTY VERSES A REVERSE TOTAL SHOULDER ARTHROPLASTY;  Surgeon: Augustin Schooling, MD;  Location: [place];  Service: Orthopedics;  Laterality: Left;    There were no vitals filed for this visit.   Subjective Assessment - 10/25/20 0912     Subjective COVID-19 screen performed prior to patient entering clinic.  Knee not hurting much today.    Pertinent History CAD, coronary stent, chroinic low back pain, left TSA, right hand surgery, HTN, low back surgery, prior right knee surgery.    Currently in Pain? Yes    Pain Score 2     Pain Location Knee    Pain Type Surgical pain    Pain Onset More than a month ago                               [facility] Adult PT Treatment/Exercise - 10/25/20 0001       Exercises   Exercises Knee/Hip      Knee/Hip Exercises: Aerobic   Recumbent Bike 15 minutes.      Knee/Hip Exercises: Machines for Strengthening   Cybex Knee Extension 10# x 3 minutes.      Vasopneumatic   Number Minutes Vasopneumatic  20 minutes    Vasopnuematic Location  --   Right knee.   Vasopneumatic Pressure Medium      Manual Therapy   Manual Therapy  Passive ROM    Passive ROM In supine:  5 minutes overpressure stretch while performing STW/M to patient's right hamstrings.                          PT Long Term Goals - 10/18/20 0926       PT LONG TERM GOAL #1   Title Independent with a HEP.    Time 4    Period Weeks    Status Achieved      PT LONG TERM GOAL #2   Title Full active right  knee extension in order to normalize gait.    Baseline AROM -15 degrees PROM -10 degrees 09/08/20    Period Weeks    Status On-going      PT LONG TERM GOAL #3   Title Active knee flexion to 115 degrees+ so the patient can perform functional tasks and do so with pain not > 2-3/10.    Baseline active 117 and passive to 125 degrees.    Time 4    Period Weeks    Status Achieved      PT LONG TERM GOAL #4   Title Increaseright  knee and hip strength to a solid 4+/5 to provide good stability for accomplishment of functional activities.    Time 4    Period Weeks    Status Achieved      PT LONG TERM GOAL #5   Time 4    Period Weeks    Status On-going                   Plan - 10/25/20 0915     Clinical Impression Statement The patient, overall, has made very good progress.  He ia now ambulating with a cane.  He states when he is done with PT to his knee he is going to get tretament for some dizziness he has been experiencing (per MD).  He continues to lack some right knee extension per contralateral comparison.  He did well with stretching and STW/M.    Personal Factors and Comorbidities Other;Comorbidity 1;Comorbidity 2    Comorbidities CAD, coronary stent, chroinic low back pain, left TSA, right hand surgery, HTN, low back surgery, prior right knee surgery.    Examination-Activity Limitations Other;Transfers;Locomotion Level;Stairs    Examination-Participation Restrictions Other    Stability/Clinical Decision Making Stable/Uncomplicated    Rehab Potential Excellent    PT Treatment/Interventions ADLs/Self Care Home  Management;Cryotherapy;Electrical Stimulation;Moist Heat;Functional mobility Arts administrator;Therapeutic activities;Therapeutic exercise;Neuromuscular re-education;Manual techniques;Patient/family education;Passive range of motion;Vasopneumatic Device    PT Next Visit Plan Progress into TKA protocol.  Vasopneumatic.    Consulted and Agree with Plan of Care Patient             Patient will benefit from skilled therapeutic intervention in order to improve the following deficits and impairments:  Pain, Abnormal gait, Difficulty walking, Decreased activity tolerance, Decreased mobility, Decreased range of motion, Decreased strength, Increased edema  Visit Diagnosis: Muscle weakness (generalized)  Chronic pain of right knee  Localized edema  Stiffness of right knee, not elsewhere classified     Problem List Patient Active Problem List   Diagnosis Date Noted   Pain due to unicompartmental arthroplasty of knee ([place]) 08/25/2020   Peripheral arterial disease ([place]) 11/19/2019   RBBB 01/16/2017   PUD (peptic ulcer disease) 01/16/2017   Melena 01/12/2017   Unstable angina ([place]) 01/11/2017   CAD S/P percutaneous coronary angioplasty 01/09/2017   Mixed hyperlipidemia 01/09/2017   DM type 2 causing vascular disease ([place]) 12/24/2016   Essential hypertension, benign 12/24/2016   Chest pain, rule out acute myocardial infarction 12/24/2016   ACS (acute coronary syndrome) [facility])    History of NSTEMI    Myelopathy ([place]) 06/09/2015    Jerine Surles, [place], PT 10/25/2020, 9:19 AM  [facility] [address] [place], [place], [address] Phone: [phone]   Fax:  [phone]  Name: Matthew [name] MRN: [id number] Date of Birth: 18-Aug-1940

## 2020-10-27 ENCOUNTER — Encounter: Payer: Medicare Other | Admitting: Physical Therapy

## 2020-10-28 ENCOUNTER — Ambulatory Visit: Payer: Medicare Other | Admitting: Physical Therapy

## 2020-10-28 ENCOUNTER — Encounter: Payer: Self-pay | Admitting: Physical Therapy

## 2020-10-28 ENCOUNTER — Other Ambulatory Visit: Payer: Self-pay

## 2020-10-28 DIAGNOSIS — M25561 Pain in right knee: Secondary | ICD-10-CM | POA: Diagnosis not present

## 2020-10-28 DIAGNOSIS — R600 Localized edema: Secondary | ICD-10-CM

## 2020-10-28 DIAGNOSIS — G8929 Other chronic pain: Secondary | ICD-10-CM | POA: Diagnosis not present

## 2020-10-28 DIAGNOSIS — M6281 Muscle weakness (generalized): Secondary | ICD-10-CM | POA: Diagnosis not present

## 2020-10-28 DIAGNOSIS — M25661 Stiffness of right knee, not elsewhere classified: Secondary | ICD-10-CM | POA: Diagnosis not present

## 2020-10-28 NOTE — Therapy (Addendum)
[facility] [place], [place], [address] Phone: [phone]   Fax:  [phone]  Physical Therapy Treatment  Patient Details  Name: GABRIAN HOQUE MRN: [id number] Date of Birth: April 19, 1940 Referring Provider (PT): Gaynelle Arabian MD   Encounter Date: 10/28/2020   PT End of Session - 10/28/20 0820     Visit Number 23    Number of Visits 24    Date for PT Re-Evaluation 11/01/20    Authorization Type FOTO AT LEAST EVERY 5TH VISIT.  PROGRESS NOTE AT 10TH VISIT.  KX MODIFIER AFTER 15 VISITS.    PT Start Time 0818    PT Stop Time 0903    PT Time Calculation (min) 45 min    Equipment Utilized During Treatment Other (comment)   SPC   Activity Tolerance Patient tolerated treatment well    Behavior During Therapy [facility] for tasks assessed/performed             Past Medical History:  Diagnosis Date   Anticoagulated    plavix   Arthritis    Shoulder, knees, back    CAD in native artery cardiologist-  dr Angelena Form   a. CAD/NSTEMI ,  cardiac cath staged stenting-- 12-25-2016  PTCA and DES x3 to prox. and mid LAD;  12-26-2016  PCI to PLA and DES x1 to midRCA,  EF 60-65%.   Chronic lower back pain    CKD (chronic kidney disease), stage III (HCC)    Complication of anesthesia    "Too much with shoulder surgery", pt. reports that he was told the at they "lost him, due to absorbing too much anesthesia".  shoulder surgery 1985   DDD (degenerative disc disease), lumbosacral    GERD (gastroesophageal reflux disease)    History of gastric ulcer 01/2017   History of kidney stones    History of malignant melanoma    right side of nose   History of non-ST elevation myocardial infarction (NSTEMI) 12/23/2017   s/p  staged cardiac cath,  s/p  PCI and DEStenting   Hyperlipidemia    Hypertension    IDA (iron deficiency anemia)    Myocardial infarction ([place])    Nocturia    RBBB    Renal calculus, right    S/P drug eluting coronary stent  placement 12-25-2017,  12-26-2017   PTCA and DES x3 to prox. and mid LAD;  PCI to PLA and DES x1 to midRCA   Type 2 diabetes mellitus treated with insulin ([place])    followed by pcp    Past Surgical History:  Procedure Laterality Date   ANAL FISSURE REPAIR  X 2   CATARACT EXTRACTION W/ INTRAOCULAR LENS  IMPLANT, BILATERAL Bilateral 2017;  2015   COLONOSCOPY WITH PROPOFOL N/A 06/05/2016   Procedure: COLONOSCOPY WITH PROPOFOL;  Surgeon: Garlan Fair, MD;  [facility];  Service: Endoscopy;  Laterality: N/A;   CONVERSION TO TOTAL KNEE Right 08/25/2020   Procedure: Revision right knee unicompartmental arthroplasty to total knee arthroplasty;  Surgeon: Gaynelle Arabian, MD;  [facility];  Service: Orthopedics;  Laterality: Right;   CORONARY ANGIOGRAPHY N/A 12/26/2016   Procedure: CORONARY ANGIOGRAPHY;  Surgeon: Troy Sine, MD;  Location: [place] [facility];  Service: Cardiovascular;  Laterality: N/A;   CORONARY STENT INTERVENTION N/A 12/25/2016   Procedure: CORONARY STENT INTERVENTION;  Surgeon: Burnell Blanks, MD;  Location: [place] [facility];  Service: Cardiovascular;  Laterality: N/A;   CORONARY STENT INTERVENTION N/A 12/26/2016   Procedure:  CORONARY STENT INTERVENTION;  Surgeon: Troy Sine, MD;  Location: [place] CV LAB;  Service: Cardiovascular;  Laterality: N/A;   CYSTOSCOPY/URETEROSCOPY/HOLMIUM LASER/STENT PLACEMENT Right 01/07/2018   Procedure: RIGHT URETEROSCOPY/HOLMIUM LASER/STENT PLACEMENT;  Surgeon: Lucas Mallow, MD;  Location[facility];  Service: Urology;  Laterality: Right;   ESOPHAGOGASTRODUODENOSCOPY (EGD) WITH PROPOFOL N/A 01/12/2017   Procedure: ESOPHAGOGASTRODUODENOSCOPY (EGD) WITH PROPOFOL;  Surgeon: Wilford Corner, MD;  Location: [place];  Service: Endoscopy;  Laterality: N/A;   HAND TENDON SURGERY Right 10-29-2002   dr Burney Gauze _0    right index and thumb   KNEE ARTHROSCOPY Bilateral 2009-;2010   _1     LEFT HEART CATH AND CORONARY ANGIOGRAPHY N/A 12/25/2016   Procedure: LEFT HEART CATH AND CORONARY ANGIOGRAPHY;  Surgeon: Burnell Blanks, MD;  Location: [place] [facility];  Service: Cardiovascular;  Laterality: N/A;   LEFT HEART CATH AND CORONARY ANGIOGRAPHY N/A 10/10/2019   Procedure: LEFT HEART CATH AND CORONARY ANGIOGRAPHY;  Surgeon: Troy Sine, MD;  Location: [place] CV LAB;  Service: Cardiovascular;  Laterality: N/A;   LUMBAR LAMINECTOMY/DECOMPRESSION MICRODISCECTOMY N/A 06/09/2015   Procedure: Laminectomy - T12-L1;  Surgeon: Eustace Moore, MD;  Location: [place] [facility];  Service: Neurosurgery;  Laterality: N/A;  Laminectomy - T12-L1   MEDIAL PARTIAL KNEE REPLACEMENT Bilateral 2009-2010    [facility]    MELANOMA EXCISION Right    "side of my nose"   SHOULDER SURGERY Left 1985   TOTAL SHOULDER ARTHROPLASTY Left 12/06/2012   Procedure: LEFT TOTAL SHOULDER ARTHROPLASTY VERSES A REVERSE TOTAL SHOULDER ARTHROPLASTY;  Surgeon: Augustin Schooling, MD;  Location: [place];  Service: Orthopedics;  Laterality: Left;    There were no vitals filed for this visit.   Subjective Assessment - 10/28/20 0819     Subjective COVID-19 screen performed prior to patient entering clinic. Reports that he was discharged by MD.    Pertinent History CAD, coronary stent, chroinic low back pain, left TSA, right hand surgery, HTN, low back surgery, prior right knee surgery.    How long can you walk comfortably? Around home with a FWW.    Patient Stated Goals Get around without pain.    Currently in Pain? No/denies                [facility] PT Assessment - 10/28/20 0001       Assessment   Medical Diagnosis Right total knee replacement    Referring Provider (PT) Gaynelle Arabian MD    Onset Date/Surgical Date 08/25/20    Next MD Visit 10/26/2020      Precautions   Precaution Comments No ultrasound.      Restrictions   Weight Bearing Restrictions No      Observation/Other Assessments   Focus on  Therapeutic Outcomes (FOTO)  24% limitation 23rd visit 10/28/2020      ROM / Strength   AROM / PROM / Strength AROM      AROM   Overall AROM  Deficits;Within functional limits for tasks performed    AROM Assessment Site Knee    Right/Left Knee Right    Right Knee Extension 18    Right Knee Flexion 118                           [facility] Adult PT Treatment/Exercise - 10/28/20 0001       Ambulation/Gait   Stairs Yes    Stairs Assistance 6: Modified independent (Device/Increase time)  Stair Management Technique One rail Right;Step to pattern;Forwards;With cane    Number of Stairs 4   x3 RT   Height of Stairs 6.5      Knee/Hip Exercises: Aerobic   Recumbent Bike L4, seat 1 x15 min      Knee/Hip Exercises: Machines for Strengthening   Cybex Knee Extension 10# 3x10 reps    Cybex Knee Flexion 40# 3x10 reps      Knee/Hip Exercises: Standing   Forward Lunges Right;20 reps;3 seconds;Limitations    Lateral Step Up Right;2 sets;10 reps;Hand Hold: 2;Step Height: 4"      Modalities   Modalities Vasopneumatic      Vasopneumatic   Number Minutes Vasopneumatic  10 minutes    Vasopnuematic Location  Knee    Vasopneumatic Pressure Medium    Vasopneumatic Temperature  34 degrees for edema                          PT Long Term Goals - 10/28/20 5597       PT LONG TERM GOAL #1   Title Independent with a HEP.    Time 4    Period Weeks    Status Achieved      PT LONG TERM GOAL #2   Title Full active right knee extension in order to normalize gait.    Baseline AROM -18 degrees 10/28/2020    Time 4    Period Weeks    Status Not Met      PT LONG TERM GOAL #3   Title Active knee flexion to 115 degrees+ so the patient can perform functional tasks and do so with pain not > 2-3/10.    Baseline active 117 and passive to 125 degrees.    Time 4    Period Weeks    Status Achieved      PT LONG TERM GOAL #4   Title Increaseright  knee and hip strength to a  solid 4+/5 to provide good stability for accomplishment of functional activities.    Time 4    Period Weeks    Status Achieved      PT LONG TERM GOAL #5   Title Perform a reciprocating stair gait with one railing with pain not > 2-3/10.    Time 4    Period Weeks    Status Partially Met   Nonreciprical but painless                  Plan - 10/28/20 0859     Clinical Impression Statement Patient presented in clinic with reports of LBP but none in R knee. Patient was discharged via surgeon and told that ROM improvements would come with time. Patient unable to reciprcially descend or ascend stairs but no pain. Patient states that his [place] has a small set of stairs and a handrail but the two steps at home do not have handrail. Patient able to achieve all goals except stairs and knee extension. Using SPC at this time. Normal vasopneumatic response noted following removal of the modality.    Personal Factors and Comorbidities Other;Comorbidity 1;Comorbidity 2    Comorbidities CAD, coronary stent, chroinic low back pain, left TSA, right hand surgery, HTN, low back surgery, prior right knee surgery.    Examination-Activity Limitations Other;Transfers;Locomotion Level;Stairs    Examination-Participation Restrictions Other    Stability/Clinical Decision Making Stable/Uncomplicated    Rehab Potential Excellent    PT Frequency 3x / week    PT Duration  4 weeks    PT Treatment/Interventions ADLs/Self Care Home Management;Cryotherapy;Electrical Stimulation;Moist Heat;Functional mobility Arts administrator;Therapeutic activities;Therapeutic exercise;Neuromuscular re-education;Manual techniques;Patient/family education;Passive range of motion;Vasopneumatic Device    PT Next Visit Plan Progress into TKA protocol.  Vasopneumatic.    Consulted and Agree with Plan of Care Patient             Patient will benefit from skilled therapeutic intervention in order to improve the  following deficits and impairments:  Pain, Abnormal gait, Difficulty walking, Decreased activity tolerance, Decreased mobility, Decreased range of motion, Decreased strength, Increased edema  Visit Diagnosis: Muscle weakness (generalized)  Chronic pain of right knee  Localized edema  Stiffness of right knee, not elsewhere classified     Problem List Patient Active Problem List   Diagnosis Date Noted   Pain due to unicompartmental arthroplasty of knee ([place]) 08/25/2020   Peripheral arterial disease ([place]) 11/19/2019   RBBB 01/16/2017   PUD (peptic ulcer disease) 01/16/2017   Melena 01/12/2017   Unstable angina ([place]) 01/11/2017   CAD S/P percutaneous coronary angioplasty 01/09/2017   Mixed hyperlipidemia 01/09/2017   DM type 2 causing vascular disease ([place]) 12/24/2016   Essential hypertension, benign 12/24/2016   Chest pain, rule out acute myocardial infarction 12/24/2016   ACS (acute coronary syndrome) [facility])    History of NSTEMI    Myelopathy ([place]) 06/09/2015   Standley Brooking, PTA 10/28/20 9:14 AM   [facility] [place], [place], [address] Phone: [phone]   Fax:  [phone]  Name: RIVALDO HINEMAN MRN: [id number] Date of Birth: 09-03-1940   PHYSICAL THERAPY DISCHARGE SUMMARY  Visits from Start of Care: 23.  Current functional level related to goals / functional outcomes: See above.   Remaining deficits: See goal section.  Patient continues to lack some knee extension and not perform a reciprocating stair gait.  Patient is very pleased with his overall progress and is ready for discharge.   Education / Equipment: HEP.   Patient agrees to discharge. Patient goals were partially met. Patient is being discharged due to being pleased with the current functional level.    Mali Applegate MPT

## 2020-11-03 DIAGNOSIS — L2089 Other atopic dermatitis: Secondary | ICD-10-CM | POA: Diagnosis not present

## 2020-11-03 DIAGNOSIS — B359 Dermatophytosis, unspecified: Secondary | ICD-10-CM | POA: Diagnosis not present

## 2020-11-03 DIAGNOSIS — Z85820 Personal history of malignant melanoma of skin: Secondary | ICD-10-CM | POA: Diagnosis not present

## 2020-11-11 DIAGNOSIS — E1159 Type 2 diabetes mellitus with other circulatory complications: Secondary | ICD-10-CM | POA: Diagnosis not present

## 2020-11-11 DIAGNOSIS — E782 Mixed hyperlipidemia: Secondary | ICD-10-CM | POA: Diagnosis not present

## 2020-11-12 LAB — LIPID PANEL
Chol/HDL Ratio: 4.6 ratio (ref 0.0–5.0)
Cholesterol, Total: 182 mg/dL (ref 100–199)
HDL: 40 mg/dL (ref >39)
LDL Chol Calc (NIH): 117 mg/dL — ABNORMAL HIGH (ref 0–99)
Triglycerides: 142 mg/dL (ref 0–149)
VLDL Cholesterol Cal: 25 mg/dL (ref 5–40)

## 2020-11-12 LAB — COMPREHENSIVE METABOLIC PANEL
ALT: 13 IU/L (ref 0–44)
AST: 17 IU/L (ref 0–40)
Albumin/Globulin Ratio: 1.8 (ref 1.2–2.2)
Albumin: 4.4 g/dL (ref 3.7–4.7)
Alkaline Phosphatase: 99 IU/L (ref 44–121)
BUN/Creatinine Ratio: 15 (ref 10–24)
BUN: 17 mg/dL (ref 8–27)
Bilirubin Total: 0.5 mg/dL (ref 0.0–1.2)
CO2: 25 mmol/L (ref 20–29)
Calcium: 9.7 mg/dL (ref 8.6–10.2)
Chloride: 103 mmol/L (ref 96–106)
Creatinine, Ser: 1.17 mg/dL (ref 0.76–1.27)
Globulin, Total: 2.5 g/dL (ref 1.5–4.5)
Glucose: 142 mg/dL — ABNORMAL HIGH (ref 70–99)
Potassium: 4.9 mmol/L (ref 3.5–5.2)
Sodium: 141 mmol/L (ref 134–144)
Total Protein: 6.9 g/dL (ref 6.0–8.5)
eGFR: 64 mL/min/{1.73_m2} (ref >59)

## 2020-11-12 LAB — T4, FREE: Free T4: 1.28 ng/dL (ref 0.82–1.77)

## 2020-11-12 LAB — TSH: TSH: 1.71 u[IU]/mL (ref 0.450–4.500)

## 2020-11-16 ENCOUNTER — Other Ambulatory Visit: Payer: Self-pay

## 2020-11-16 ENCOUNTER — Encounter (HOSPITAL_COMMUNITY): Payer: Self-pay | Admitting: Physical Therapy

## 2020-11-16 ENCOUNTER — Ambulatory Visit (HOSPITAL_COMMUNITY): Payer: Medicare Other | Attending: Physician Assistant | Admitting: Physical Therapy

## 2020-11-16 DIAGNOSIS — M6281 Muscle weakness (generalized): Secondary | ICD-10-CM | POA: Diagnosis not present

## 2020-11-16 DIAGNOSIS — R42 Dizziness and giddiness: Secondary | ICD-10-CM | POA: Insufficient documentation

## 2020-11-16 NOTE — Therapy (Signed)
[place], [place], [address] Phone: [phone]   Fax:  [phone]  Physical Therapy Evaluation  Patient Details  Name: Matthew [name] MRN: [id number] Date of Birth: 02-20-1940 Referring Provider (PT): Johna Roles, [place]   Encounter Date: 11/16/2020   PT End of Session - 11/16/20 1615     Visit Number 1    Number of Visits 1    Date for PT Re-Evaluation 11/30/20    Authorization Type [employer] Medicare no auth or VL    Progress Note Due on Visit --    PT Start Time 1615    PT Stop Time 1638    PT Time Calculation (min) 23 min             Past Medical History:  Diagnosis Date   Anticoagulated    plavix   Arthritis    Shoulder, knees, back    CAD in [place] artery cardiologist-  dr Angelena Form   a. CAD/NSTEMI ,  cardiac cath staged stenting-- 12-25-2016  PTCA and DES x3 to prox. and mid LAD;  12-26-2016  PCI to PLA and DES x1 to midRCA,  EF 60-65%.   Chronic lower back pain    CKD (chronic kidney disease), stage III (HCC)    Complication of anesthesia    "Too much with shoulder surgery", pt. reports that he was told the at they "lost him, due to absorbing too much anesthesia".  shoulder surgery 1985   DDD (degenerative disc disease), lumbosacral    GERD (gastroesophageal reflux disease)    History of gastric ulcer 01/2017   History of kidney stones    History of malignant melanoma    right side of nose   History of non-ST elevation myocardial infarction (NSTEMI) 12/23/2017   s/p  staged cardiac cath,  s/p  PCI and DEStenting   Hyperlipidemia    Hypertension    IDA (iron deficiency anemia)    Myocardial infarction ([place])    Nocturia    RBBB    Renal calculus, right    S/P drug eluting coronary stent placement 12-25-2017,  12-26-2017   PTCA and DES x3 to prox. and mid LAD;  PCI to PLA and DES x1 to midRCA   Type 2 diabetes mellitus treated with insulin ([place])    followed by pcp    Past Surgical  History:  Procedure Laterality Date   ANAL FISSURE REPAIR  X 2   CATARACT EXTRACTION W/ INTRAOCULAR LENS  IMPLANT, BILATERAL Bilateral 2017;  2015   COLONOSCOPY WITH PROPOFOL N/A 06/05/2016   Procedure: COLONOSCOPY WITH PROPOFOL;  Surgeon: Garlan Fair, MD;  Location: [facility];  Service: Endoscopy;  Laterality: N/A;   CONVERSION TO TOTAL KNEE Right 08/25/2020   Procedure: Revision right knee unicompartmental arthroplasty to total knee arthroplasty;  Surgeon: Gaynelle Arabian, MD;  Location: [facility];  Service: Orthopedics;  Laterality: Right;   CORONARY ANGIOGRAPHY N/A 12/26/2016   Procedure: CORONARY ANGIOGRAPHY;  Surgeon: Troy Sine, MD;  Location: [facility];  Service: Cardiovascular;  Laterality: N/A;   CORONARY STENT INTERVENTION N/A 12/25/2016   Procedure: CORONARY STENT INTERVENTION;  Surgeon: Burnell Blanks, MD;  Location: [facility];  Service: Cardiovascular;  Laterality: N/A;   CORONARY STENT INTERVENTION N/A 12/26/2016   Procedure: CORONARY STENT INTERVENTION;  Surgeon: Troy Sine, MD;  Location: [facility];  Service: Cardiovascular;  Laterality: N/A;   CYSTOSCOPY/URETEROSCOPY/HOLMIUM LASER/STENT PLACEMENT Right 01/07/2018  Procedure: RIGHT URETEROSCOPY/HOLMIUM LASER/STENT PLACEMENT;  Surgeon: Lucas Mallow, MD;  Location: [facility];  Service: Urology;  Laterality: Right;   ESOPHAGOGASTRODUODENOSCOPY (EGD) WITH PROPOFOL N/A 01/12/2017   Procedure: ESOPHAGOGASTRODUODENOSCOPY (EGD) WITH PROPOFOL;  Surgeon: Wilford Corner, MD;  Location: [facility];  Service: Endoscopy;  Laterality: N/A;   HAND TENDON SURGERY Right 10-29-2002   dr Burney Gauze '@[facility]'    right index and thumb   KNEE ARTHROSCOPY Bilateral 2009-;2010   '@[facility]'    LEFT HEART CATH AND CORONARY ANGIOGRAPHY N/A 12/25/2016   Procedure: LEFT HEART CATH AND CORONARY ANGIOGRAPHY;  Surgeon: Burnell Blanks, MD;  Location: [facility];  Service:  Cardiovascular;  Laterality: N/A;   LEFT HEART CATH AND CORONARY ANGIOGRAPHY N/A 10/10/2019   Procedure: LEFT HEART CATH AND CORONARY ANGIOGRAPHY;  Surgeon: Troy Sine, MD;  Location: [facility];  Service: Cardiovascular;  Laterality: N/A;   LUMBAR LAMINECTOMY/DECOMPRESSION MICRODISCECTOMY N/A 06/09/2015   Procedure: Laminectomy - T12-L1;  Surgeon: Eustace Moore, MD;  Location: [facility];  Service: Neurosurgery;  Laterality: N/A;  Laminectomy - T12-L1   MEDIAL PARTIAL KNEE REPLACEMENT Bilateral 2009-2010    [facility]    MELANOMA EXCISION Right    "side of my nose"   SHOULDER SURGERY Left 1985   TOTAL SHOULDER ARTHROPLASTY Left 12/06/2012   Procedure: LEFT TOTAL SHOULDER ARTHROPLASTY VERSES A REVERSE TOTAL SHOULDER ARTHROPLASTY;  Surgeon: Augustin Schooling, MD;  Location: [facility];  Service: Orthopedics;  Laterality: Left;    There were no vitals filed for this visit.    Subjective Assessment - 11/16/20 1619     Subjective Reports he has been having lightheadedness which started on 08/25/20 when he had a total knee replacement (occurred a couple days after his surgery). States that his lightheadedness has come on and has just stayed with him. States that he tried meclizine and it helped temporarily. States he just has light pressure in his head. Head rotation used to make his symptoms worse but no longer. No position or movement makes it worse or better it just comes and goes. Reports he used to have spinning but that was about 2-3 weeks ago.    Pertinent History CAD, coronary stent, chroinic low back pain, left TSA, right hand surgery, HTN, low back surgery, prior right knee surgery.    Patient Stated Goals to not have lightedness    Currently in Pain? Yes    Pain Score 2     Pain Location --   lightheadedness               [facility] PT Assessment - 11/16/20 0001       Assessment   Medical Diagnosis R BPPV    Referring Provider (PT) Johna Roles, PA    Onset Date/Surgical  Date 08/25/20    Prior Therapy yes for right TKA earlier this year      Balance Screen   Has the patient fallen in the past 6 months No      [place] residence    Living Arrangements Alone                    Vestibular Assessment - 11/16/20 0001       Symptom Behavior   Type of Dizziness  Lightheadedness;"Funny feeling in head"    Frequency of Dizziness 1x day    Duration of Dizziness 2-3 minutes   of lightheadedness   Aggravating Factors No  known aggravating factors    Relieving Factors Medication;No known relieving factors    Progression of Symptoms Better      Oculomotor Exam   Head shaking Horizontal Absent    Smooth Pursuits Intact    Saccades Intact      Positional Testing   Dix-Hallpike Dix-Hallpike Left;Dix-Hallpike Right      Dix-Hallpike Right   Dix-Hallpike Right Duration --   negative     Dix-Hallpike Left   Dix-Hallpike Left Duration --   negative     Positional Sensitivities   Sit to Supine No dizziness    Supine to Left Side No dizziness    Supine to Right Side No dizziness    Supine to Sitting No dizziness    Up from Right Hallpike No dizziness    Up from Left Hallpike No dizziness    Head Turning x 5 No dizziness    Head Nodding x 5 No dizziness                Objective measurements completed on examination: See above findings.                  PT Short Term Goals - 11/16/20 1643       PT SHORT TERM GOAL #1   Title Patient will be able to report signs and symptoms of BPPV    Time 2    Period Weeks    Status New    Target Date 11/30/20               PT Long Term Goals - 10/28/20 2111       PT LONG TERM GOAL #1   Title Independent with a HEP.    Time 4    Period Weeks    Status Achieved      PT LONG TERM GOAL #2   Title Full active right knee extension in order to normalize gait.    Baseline AROM -18 degrees 10/28/2020    Time 4    Period Weeks    Status  Not Met      PT LONG TERM GOAL #3   Title Active knee flexion to 115 degrees+ so the patient can perform functional tasks and do so with pain not > 2-3/10.    Baseline active 117 and passive to 125 degrees.    Time 4    Period Weeks    Status Achieved      PT LONG TERM GOAL #4   Title Increaseright  knee and hip strength to a solid 4+/5 to provide good stability for accomplishment of functional activities.    Time 4    Period Weeks    Status Achieved      PT LONG TERM GOAL #5   Title Perform a reciprocating stair gait with one railing with pain not > 2-3/10.    Time 4    Period Weeks    Status Partially Met   Nonreciprical but painless                   Plan - 11/16/20 1644     Clinical Impression Statement Patient presents to therapy with history of dizziness that resolved a few weeks ago. No nystagmus or dizziness noted on this date. Negative with central and peripheral testing. Discussed possible benefits of balance program but patient with desire to return to [place] if he wants to work on his balance. No skilled PT needs identified at this time. Answered all  questions prior to end of session.    Personal Factors and Comorbidities Other;Comorbidity 1;Comorbidity 2    Comorbidities CAD, coronary stent, chroinic low back pain, left TSA, right hand surgery, HTN, low back surgery, prior right knee surgery.    Examination-Activity Limitations Locomotion Level    Stability/Clinical Decision Making Stable/Uncomplicated    Clinical Decision Making Low    Rehab Potential Good    PT Frequency One time visit    PT Treatment/Interventions ADLs/Self Care Home Management    PT Next Visit Plan one time visit    PT Home Exercise Plan none             Patient will benefit from skilled therapeutic intervention in order to improve the following deficits and impairments:  Decreased mobility  Visit Diagnosis: Dizziness and giddiness  Muscle weakness (generalized)     Problem  List Patient Active Problem List   Diagnosis Date Noted   Pain due to unicompartmental arthroplasty of knee ([place]) 08/25/2020   Peripheral arterial disease ([place]) 11/19/2019   RBBB 01/16/2017   PUD (peptic ulcer disease) 01/16/2017   Melena 01/12/2017   Unstable angina (HCC) 01/11/2017   CAD S/P percutaneous coronary angioplasty 01/09/2017   Mixed hyperlipidemia 01/09/2017   DM type 2 causing vascular disease ([place]) 12/24/2016   Essential hypertension, benign 12/24/2016   Chest pain, rule out acute myocardial infarction 12/24/2016   ACS (acute coronary syndrome) [facility])    History of NSTEMI    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

## 2020-11-17 ENCOUNTER — Encounter: Payer: Self-pay | Admitting: "Endocrinology

## 2020-11-17 ENCOUNTER — Ambulatory Visit: Payer: Medicare Other | Admitting: "Endocrinology

## 2020-11-17 VITALS — BP 130/68 | HR 56 | Ht 68.0 in | Wt 189.4 lb

## 2020-11-17 DIAGNOSIS — E1159 Type 2 diabetes mellitus with other circulatory complications: Secondary | ICD-10-CM

## 2020-11-17 DIAGNOSIS — E782 Mixed hyperlipidemia: Secondary | ICD-10-CM

## 2020-11-17 DIAGNOSIS — I10 Essential (primary) hypertension: Secondary | ICD-10-CM

## 2020-11-17 LAB — POCT GLYCOSYLATED HEMOGLOBIN (HGB A1C): HbA1c, POC (controlled diabetic range): 6.4 % (ref 0.0–7.0)

## 2020-11-17 MED ORDER — ROSUVASTATIN CALCIUM 5 MG PO TABS: 5.0000 mg | ORAL_TABLET | Freq: Every day | ORAL | 2 refills | Status: DC

## 2020-11-17 NOTE — Patient Instructions (Signed)

## 2020-11-17 NOTE — Progress Notes (Signed)
07/14/2020, 1:13 PM  Endocrinology follow-up note   Subjective:    Patient ID: Matthew [name], male    DOB: Oct 13, 1940.  Matthew [name] is being seen in follow up after he was seen in consultation for management of currently uncontrolled symptomatic diabetes requested by  Wenda Low, MD.   Past Medical History:  Diagnosis Date   Anticoagulated    plavix   Arthritis    Shoulder, knees, back    CAD in native artery cardiologist-  dr Angelena Form   a. CAD/NSTEMI ,  cardiac cath staged stenting-- 12-25-2016  PTCA and DES x3 to prox. and mid LAD;  12-26-2016  PCI to PLA and DES x1 to midRCA,  EF 60-65%.   Chronic lower back pain    CKD (chronic kidney disease), stage III (HCC)    Complication of anesthesia    "Too much with shoulder surgery", pt. reports that he was told the at they "lost him, due to absorbing too much anesthesia".  shoulder surgery 1985   DDD (degenerative disc disease), lumbosacral    GERD (gastroesophageal reflux disease)    History of gastric ulcer 01/2017   History of kidney stones    History of malignant melanoma    right side of nose   History of non-ST elevation myocardial infarction (NSTEMI) 12/23/2017   s/p  staged cardiac cath,  s/p  PCI and DEStenting   Hyperlipidemia    Hypertension    IDA (iron deficiency anemia)    Nocturia    RBBB    Renal calculus, right    S/P drug eluting coronary stent placement 12-25-2017,  12-26-2017   PTCA and DES x3 to prox. and mid LAD;  PCI to PLA and DES x1 to midRCA   Type 2 diabetes mellitus treated with insulin ([place])    followed by pcp    Past Surgical History:  Procedure Laterality Date   ANAL FISSURE REPAIR  X 2   CATARACT EXTRACTION W/ INTRAOCULAR LENS  IMPLANT, BILATERAL Bilateral 2017;  2015   COLONOSCOPY WITH PROPOFOL N/A 06/05/2016   Procedure: COLONOSCOPY WITH PROPOFOL;  Surgeon: Garlan Fair, MD;  Location: [facility];  Service: Endoscopy;  Laterality: N/A;    CORONARY ANGIOGRAPHY N/A 12/26/2016   Procedure: CORONARY ANGIOGRAPHY;  Surgeon: Troy Sine, MD;  Location: [place];  Service: Cardiovascular;  Laterality: N/A;   CORONARY STENT INTERVENTION N/A 12/25/2016   Procedure: CORONARY STENT INTERVENTION;  Surgeon: Burnell Blanks, MD;  Location: [facility];  Service: Cardiovascular;  Laterality: N/A;   CORONARY STENT INTERVENTION N/A 12/26/2016   Procedure: CORONARY STENT INTERVENTION;  Surgeon: Troy Sine, MD;  Location: [facility];  Service: Cardiovascular;  Laterality: N/A;   CYSTOSCOPY/URETEROSCOPY/HOLMIUM LASER/STENT PLACEMENT Right 01/07/2018   Procedure: RIGHT URETEROSCOPY/HOLMIUM LASER/STENT PLACEMENT;  Surgeon: Lucas Mallow, MD;  Location: [facility];  Service: Urology;  Laterality: Right;   ESOPHAGOGASTRODUODENOSCOPY (EGD) WITH PROPOFOL N/A 01/12/2017   Procedure: ESOPHAGOGASTRODUODENOSCOPY (EGD) WITH PROPOFOL;  Surgeon: Wilford Corner, MD;  Location: [facility];  Service: Endoscopy;  Laterality: N/A;   HAND TENDON SURGERY Right 10-29-2002   dr Burney Gauze @[facility]    right index and thumb   KNEE ARTHROSCOPY Bilateral 2009-;2010   @[facility]    LEFT HEART CATH AND CORONARY ANGIOGRAPHY N/A 12/25/2016   Procedure: LEFT HEART CATH AND CORONARY ANGIOGRAPHY;  Surgeon: Burnell Blanks, MD;  Location: [facility];  Service: Cardiovascular;  Laterality: N/A;   LEFT HEART CATH AND CORONARY ANGIOGRAPHY N/A 10/10/2019   Procedure: LEFT HEART CATH AND CORONARY ANGIOGRAPHY;  Surgeon: Troy Sine, MD;  Location: [facility];  Service: Cardiovascular;  Laterality: N/A;   LUMBAR LAMINECTOMY/DECOMPRESSION MICRODISCECTOMY N/A 06/09/2015   Procedure: Laminectomy - T12-L1;  Surgeon: Eustace Moore, MD;  Location: [facility];  Service: Neurosurgery;  Laterality: N/A;  Laminectomy - T12-L1   MEDIAL PARTIAL KNEE REPLACEMENT Bilateral 2009-2010    [facility]    MELANOMA EXCISION Right     "side of my nose"   SHOULDER SURGERY Left 1985   TOTAL SHOULDER ARTHROPLASTY Left 12/06/2012   Procedure: LEFT TOTAL SHOULDER ARTHROPLASTY VERSES A REVERSE TOTAL SHOULDER ARTHROPLASTY;  Surgeon: Augustin Schooling, MD;  Location: [facility];  Service: Orthopedics;  Laterality: Left;    Social History   Socioeconomic History   Marital status: Married    Spouse name: Not on file   Number of children: Not on file   Years of education: Not on file   Highest education level: Not on file  Occupational History   Not on file  Tobacco Use   Smoking status: Never Smoker   Smokeless tobacco: Never Used  Vaping Use   Vaping Use: Never used  Substance and Sexual Activity   Alcohol use: No   Drug use: No   Sexual activity: Yes  Other Topics Concern   Not on file  Social History Narrative   Not on file   Social Determinants of Health   Financial Resource Strain: Not on file  Food Insecurity: Not on file  Transportation Needs: Not on file  Physical Activity: Not on file  Stress: Not on file  Social Connections: Not on file    Family History  Problem Relation Age of Onset   CAD Father    Heart failure Father    CAD Sister    Stroke Mother    Heart failure Mother     Outpatient Encounter Medications as of 07/14/2020  Medication Sig   acetaminophen (TYLENOL) 325 MG tablet Take 2 tablets (650 mg total) by mouth every 6 (six) hours as needed for mild pain or headache.   Alogliptin Benzoate 12.5 MG TABS Take 12.5 mg by mouth daily.   amoxicillin (AMOXIL) 500 MG capsule Take 4 capsules by mouth as needed. Take 4 capsules 1 hour prior to dental work   Ascorbic Acid (VITAMIN C PO) Take 1,000 mg by mouth daily.   clopidogrel (PLAVIX) 75 MG tablet Take 1 tablet (75 mg total) by mouth daily.   Continuous Blood Gluc Receiver (FREESTYLE LIBRE 2 READER) DEVI As directed   Continuous Blood Gluc Sensor (FREESTYLE LIBRE 2 SENSOR) MISC 1 Piece by Does not apply route every 14 (fourteen) days.    insulin aspart protamine - aspart (NOVOLOG 70/30 FLEXPEN RELION) (70-30) 100 UNIT/ML FlexPen Inject 10 Units into the skin 2 (two) times daily before a meal.   isosorbide mononitrate (IMDUR) 60 MG 24 hr tablet Take 1.5 tablets (90 mg total) by mouth daily.   losartan (COZAAR) 50 MG tablet Take 1 tablet (50 mg total) by mouth daily.   metFORMIN (GLUCOPHAGE XR) 500 MG 24 hr tablet Take 1 tablet (500 mg total) by mouth daily with breakfast.   metoprolol tartrate (LOPRESSOR) 25 MG tablet TAKE 1 TABLET BY MOUTH TWICE A DAY   nitroGLYCERIN (NITROSTAT) 0.4 MG SL tablet Place 1 tablet (0.4 mg total) under the tongue every 5 (five) minutes as needed  for chest pain.   Omega-3 Fatty Acids (FISH OIL) 1000 MG CAPS Take 1,000 mg by mouth daily.   pantoprazole (PROTONIX) 40 MG tablet Take 40 mg by mouth daily.   TURMERIC PO Take 1,500 mg by mouth 2 (two) times daily.   Vitamin D, Cholecalciferol, 25 MCG (1000 UT) CAPS Take 1,000 Units by mouth daily.    Zinc 50 MG CAPS Take 1 capsule by mouth daily.   No facility-administered encounter medications on file as of 07/14/2020.    ALLERGIES: Allergies  Allergen Reactions   Statins Other (See Comments)    Severe stomach pain.   Oxycodone Itching   Hydrocodone Other (See Comments)    "messes with my hearing"   Codeine Itching   Morphine And Related Itching    VACCINATION STATUS: Immunization History  Administered Date(s) Administered   Influenza-Unspecified 10/24/2012   Moderna Sars-Covid-2 Vaccination 02/07/2019, 03/10/2019    Diabetes He presents for his follow-up diabetic visit. He has type 2 diabetes mellitus. Onset time: He was diagnosed at approximate age of 68 years. His disease course has been improving. There are no hypoglycemic associated symptoms. Pertinent negatives for hypoglycemia include no confusion, headaches, pallor or seizures. Pertinent negatives for diabetes include no blurred vision, no chest pain, no fatigue, no polydipsia, no  polyphagia, no polyuria and no weakness. There are no hypoglycemic complications. Symptoms are improving. Diabetic complications include heart disease and PVD. Risk factors for coronary artery disease include diabetes mellitus, dyslipidemia, male sex, obesity and sedentary lifestyle. Current diabetic treatment includes insulin injections (He is currently on NovoLog 70/30 26 units daily, alogliptin 12.5 mg p.o. daily.). His weight is stable. He is following a generally unhealthy diet. When asked about meal planning, he reported none. He has had a previous visit with a dietitian. He participates in exercise intermittently. His home blood glucose trend is decreasing steadily. His breakfast blood glucose range is generally 110-130 mg/dl. His bedtime blood glucose range is generally 130-140 mg/dl. His overall blood glucose range is 130-140 mg/dl. (He presents with continued improvement in his glycemic profile to near target levels at fasting and postprandial.  He is point-of-care A1c is 6.4%, overall improving from 10.9%.   He uses his CGM properly.  He denies or did not document any hypoglycemia. ) An ACE inhibitor/angiotensin II receptor blocker is not being taken. Eye exam is current.  Hyperlipidemia This is a chronic problem. The current episode started more than 1 year ago. The problem is uncontrolled. Exacerbating diseases include diabetes. Associated symptoms include myalgias. Pertinent negatives include no chest pain or shortness of breath. Current antihyperlipidemic treatment includes bile acid sequestrants. Risk factors for coronary artery disease include diabetes mellitus, dyslipidemia, hypertension and male sex.    Review of Systems  Constitutional:  Negative for chills, fatigue, fever and unexpected weight change.  HENT:  Negative for dental problem, mouth sores and trouble swallowing.   Eyes:  Negative for blurred vision and visual disturbance.  Respiratory:  Negative for cough, choking, chest  tightness, shortness of breath and wheezing.   Cardiovascular:  Negative for chest pain, palpitations and leg swelling.  Gastrointestinal:  Negative for abdominal distention, abdominal pain, constipation, diarrhea, nausea and vomiting.  Endocrine: Negative for polydipsia, polyphagia and polyuria.  Genitourinary:  Negative for dysuria, flank pain, hematuria and urgency.  Musculoskeletal:  Positive for myalgias. Negative for back pain, gait problem and neck pain.  Skin:  Negative for pallor, rash and wound.  Neurological:  Negative for seizures, syncope, weakness, numbness and headaches.  Psychiatric/Behavioral:  Negative for confusion and dysphoric mood.    Objective:    Vitals with BMI 07/14/2020 01/14/2020 12/10/2019  Height 5\' 8"  5\' 8"  5\' 8"   Weight 195 lbs 195 lbs 8 oz 196 lbs  BMI 29.66 42.59 56.38  Systolic 756 433 295  Diastolic 66 88 78  Pulse 56 64 60    BP (!) 144/66   Pulse (!) 56   Ht 5\' 8"  (1.727 m)   Wt 195 lb (88.5 kg)   BMI 29.65 kg/m   Wt Readings from Last 3 Encounters:  07/14/20 195 lb (88.5 kg)  01/14/20 195 lb 8 oz (88.7 kg)  12/10/19 196 lb (88.9 kg)     Physical Exam Constitutional:      General: He is not in acute distress.    Appearance: He is well-developed.  HENT:     Head: Normocephalic and atraumatic.  Neck:     Thyroid: No thyromegaly.     Trachea: No tracheal deviation.  Cardiovascular:     Rate and Rhythm: Normal rate.     Pulses:          Dorsalis pedis pulses are 1+ on the right side and 1+ on the left side.       Posterior tibial pulses are 1+ on the right side and 1+ on the left side.     Heart sounds: Normal heart sounds, S1 normal and S2 normal. No murmur heard.   No gallop.  Pulmonary:     Effort: Pulmonary effort is normal. No respiratory distress.     Breath sounds: No wheezing.  Abdominal:     General: There is no distension.     Tenderness: There is no abdominal tenderness. There is no guarding.  Musculoskeletal:     Right  shoulder: No swelling or deformity.     Cervical back: Normal range of motion and neck supple.  Skin:    General: Skin is warm and dry.     Findings: No rash.     Nails: There is no clubbing.  Neurological:     Mental Status: He is alert and oriented to person, place, and time.     Cranial Nerves: No cranial nerve deficit.     Sensory: No sensory deficit.     Gait: Gait normal.     Deep Tendon Reflexes: Reflexes are normal and symmetric.  Psychiatric:        Speech: Speech normal.        Behavior: Behavior normal. Behavior is cooperative.        Thought Content: Thought content normal.        Judgment: Judgment normal.    CMP ( most recent) CMP     Component Value Date/Time   NA 141 10/08/2019 1556   K 4.5 10/08/2019 1556   CL 105 10/08/2019 1556   CO2 27 10/08/2019 1556   GLUCOSE 173 (H) 10/08/2019 1556   GLUCOSE 125 (H) 01/07/2018 0931   BUN 19 10/08/2019 1556   CREATININE 1.17 10/08/2019 1556   CALCIUM 10.1 10/08/2019 1556   PROT 6.6 10/08/2019 1556   ALBUMIN 4.5 10/08/2019 1556   AST 25 10/08/2019 1556   ALT 32 10/08/2019 1556   ALKPHOS 93 10/08/2019 1556   BILITOT 0.6 10/08/2019 1556   GFRNONAA 60 10/08/2019 1556   GFRAA 69 10/08/2019 1556     Diabetic Labs (most recent): Lab Results  Component Value Date   HGBA1C 6.4 01/14/2020   HGBA1C 10.2 09/16/2019   HGBA1C  11.3 (H) 12/26/2016     Lipid Panel ( most recent) Lipid Panel     Component Value Date/Time   CHOL 143 01/22/2018 1257   TRIG 213 (H) 01/22/2018 1257   HDL 32 (L) 01/22/2018 1257   CHOLHDL 4.5 01/22/2018 1257   CHOLHDL 4.4 12/24/2016 0634   VLDL 13 12/24/2016 0634   LDLCALC 68 01/22/2018 1257   LABVLDL 43 (H) 01/22/2018 1257       Assessment & Plan:   1. 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  - Matthew [name] has currently uncontrolled symptomatic type 2 DM since  80 years of age.  He presents with continued improvement in his glycemic profile  to near target levels at fasting and postprandial.  He is point-of-care A1c is 6.4%, overall improving from 10.9%.   He uses his CGM properly.  He denies or did not document any hypoglycemia.    Recent labs reviewed. - I had a long discussion with him about the progressive nature of diabetes and the pathology behind its complications. -his diabetes is complicated by coronary artery disease, peripheral arterial disease, and he remains at a high risk for more acute and chronic complications which include CAD, CVA, CKD, retinopathy, and neuropathy. These are all discussed in detail with him.  - I have counseled him on diet  and weight management  by adopting a carbohydrate restricted/protein rich diet. Patient is encouraged to switch to  unprocessed or minimally processed     complex starch and increased protein intake (animal or plant source), fruits, and vegetables. -  he is advised to stick to a routine mealtimes to eat 3 meals  a day and avoid unnecessary snacks ( to snack only to correct hypoglycemia).   - he acknowledges that there is a room for improvement in his food and drink choices. - Suggestion is made for him to avoid simple carbohydrates  from his diet including Cakes, Sweet Desserts, Ice Cream, Soda (diet and regular), Sweet Tea, Candies, Chips, Cookies, Store Bought Juices, Alcohol in Excess of  1-2 drinks a day, Artificial Sweeteners,  Coffee Creamer, and "Sugar-free" Products, Lemonade. This will help patient to have more stable blood glucose profile and potentially avoid unintended weight gain.   - he will be scheduled with Jearld Fenton, RDN, CDE for diabetes education.  - I have approached him with the following individualized plan to manage  his diabetes and patient agrees:    -In light of his chronic diabetes he may continue to benefit from insulin treatment.  He has responded to low-dose premixed insulin.  He is advised to continue NovoLog 70/30  10 units with breakfast and  10 units with supper   for premeal blood glucose readings above 90 mg/dl ,  associated with strict monitoring of glucose 4 times a day-before meals and at bedtime. -He is utilizing his CGM device very well.  He is advised to monitor blood glucose at least 4 times a day before meals and at bedtime.    - he is encouraged to call clinic for blood glucose levels less than 70 or above 200 mg /dl. -He is advised to continue alogliptin 12.5 mg p.o. daily with breakfast and Metformin 500 mg XR p.o. daily at breakfast.    - Specific targets for  A1c;  LDL, HDL,  and Triglycerides were discussed with the patient.  2) Blood Pressure /Hypertension: His blood pressure is controlled to target.  However, in light of his microalbuminuria, he  would benefit from losartan in lieu of amlodipine.  I discussed and prescribed losartan 50 mg p.o. daily along with metoprolol 25 mg p.o. twice daily.  He is advised to discontinue amlodipine.  3) Lipids/Hyperlipidemia: Review of his recent fasting lipid panel reveals uncontrolled LDL at 117, increasing from 68.   he  Is not on statins, reports only mild side effects from statins in the past, and he is willing to retry low-dose Crestor.  I discussed and added Crestor 5 mg p.o. nightly, side effects and precautions discussed with him.    He is responding to omega-3 fatty acids in terms of his triglycerides lowered to 142 from 213.     4)  Weight/Diet:  Body mass index is 29.65 kg/m.  -  clearly complicating his diabetes care.   he is  a candidate for weight loss. I discussed with him the fact that loss of 5 - 10% of his  current body weight will have the most impact on his diabetes management.  Exercise, and detailed carbohydrates information provided  -  detailed on discharge instructions.   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  He is referred to vascular surgery for better  evaluation and treatment.  6) Chronic Care/Health Maintenance:  -he  Is not  On ARB and not on  Statin medications and  is encouraged to initiate and continue to follow up with Ophthalmology, Dentist,  Podiatrist at least yearly or according to recommendations, and advised to   stay away from smoking. I have recommended yearly flu vaccine and pneumonia vaccine at least every 5 years; moderate intensity exercise for up to 150 minutes weekly; and  sleep for at least 7 hours a day.    - he is  advised to maintain close follow up with Wenda Low, MD for primary care needs, as well as his other providers for optimal and coordinated care.     I spent 41 minutes in the care of the patient today including review of labs from [facility], Lipids, Thyroid Function, Hematology (current and previous including abstractions from other facilities); face-to-face time discussing  his blood glucose readings/logs, discussing hypoglycemia and hyperglycemia episodes and symptoms, medications doses, his options of short and long term treatment based on the latest standards of care / guidelines;  discussion about incorporating lifestyle medicine;  and documenting the encounter.    Please refer to Patient Instructions for Blood Glucose Monitoring and Insulin/Medications Dosing Guide"  in media tab for additional information. Please  also refer to " Patient Self Inventory" in the Media  tab for reviewed elements of pertinent patient history.  Gilda Crease participated in the discussions, expressed understanding, and voiced agreement with the above plans.  All questions were answered to his satisfaction. he is encouraged to contact clinic should he have any questions or concerns prior to his return visit. .   Follow up plan: - Return in about 3 months (around 10/14/2020) for F/U with Pre-visit Labs, Meter, Logs, A1c here.Glade Lloyd, MD [facility] [address] [place], [place] [address] Phone: [phone]  Fax: [phone]    07/14/2020, 1:13 PM  This note was partially dictated with voice recognition software. Similar sounding words can be transcribed inadequately or may not  be corrected upon review.

## 2020-11-30 ENCOUNTER — Telehealth: Payer: Self-pay

## 2020-11-30 DIAGNOSIS — E1159 Type 2 diabetes mellitus with other circulatory complications: Secondary | ICD-10-CM

## 2020-11-30 MED ORDER — FREESTYLE LIBRE 2 SENSOR MISC
1.0000 | 3 refills | Status: AC
Start: 2020-11-30 — End: ?

## 2020-11-30 NOTE — Telephone Encounter (Signed)
Pt is asking for a refill on his Continuous Blood Gluc Sensor (FREESTYLE LIBRE 2 SENSOR) MISC.

## 2020-11-30 NOTE — Telephone Encounter (Signed)
Sensors have been sent to the pharmacy the patient received them from last order.

## 2020-12-13 NOTE — Telephone Encounter (Signed)
I called the [facility] and they stated that the patients referral has ended and [facility] needs [facility] to [facility] to send him the [employer].  I called the patient and let him know and he will reach out to them to have them place another referral so he can receive his supplies. Patient verbalized an understanding.

## 2020-12-13 NOTE — Telephone Encounter (Signed)
Pt called and states he just talked with the [facility] pharmacy and they have not received this request for his libre.

## 2020-12-14 DIAGNOSIS — E7800 Pure hypercholesterolemia, unspecified: Secondary | ICD-10-CM | POA: Diagnosis not present

## 2020-12-14 DIAGNOSIS — Z0000 Encounter for general adult medical examination without abnormal findings: Secondary | ICD-10-CM | POA: Diagnosis not present

## 2020-12-14 DIAGNOSIS — Z789 Other specified health status: Secondary | ICD-10-CM | POA: Diagnosis not present

## 2020-12-14 DIAGNOSIS — K219 Gastro-esophageal reflux disease without esophagitis: Secondary | ICD-10-CM | POA: Diagnosis not present

## 2020-12-14 DIAGNOSIS — I10 Essential (primary) hypertension: Secondary | ICD-10-CM | POA: Diagnosis not present

## 2020-12-14 DIAGNOSIS — E1142 Type 2 diabetes mellitus with diabetic polyneuropathy: Secondary | ICD-10-CM | POA: Diagnosis not present

## 2020-12-14 DIAGNOSIS — N183 Chronic kidney disease, stage 3 unspecified: Secondary | ICD-10-CM | POA: Diagnosis not present

## 2020-12-14 DIAGNOSIS — Z1389 Encounter for screening for other disorder: Secondary | ICD-10-CM | POA: Diagnosis not present

## 2020-12-14 DIAGNOSIS — M48061 Spinal stenosis, lumbar region without neurogenic claudication: Secondary | ICD-10-CM | POA: Diagnosis not present

## 2020-12-14 DIAGNOSIS — I2510 Atherosclerotic heart disease of native coronary artery without angina pectoris: Secondary | ICD-10-CM | POA: Diagnosis not present

## 2020-12-14 DIAGNOSIS — D500 Iron deficiency anemia secondary to blood loss (chronic): Secondary | ICD-10-CM | POA: Diagnosis not present

## 2020-12-15 DIAGNOSIS — M25511 Pain in right shoulder: Secondary | ICD-10-CM | POA: Diagnosis not present

## 2020-12-23 NOTE — Telephone Encounter (Signed)
Patient is calling to check on this. He said he still has not received his sensors and the [facility] is telling him that this office has to fill out paperwork every 6 months. Have you seen this? Please advise

## 2020-12-27 NOTE — Telephone Encounter (Signed)
I called the [facility] on Friday and was disconnected twice.

## 2020-12-27 NOTE — Telephone Encounter (Signed)
I called the [facility] at [phone] and spoke to [facility] who was looking into why the patient has not received his [employer] from them. Patrice stated that the patient has to have a referral for Endocrinology every 6 months. I spoke to several different people and was transferred to the [facility] and the lady stated that the patient has to have the [facility] Endocrinology team send a referral to [facility] every 6 months.  [facility] [phone]  Ext. [phone] I was not given the Endo line since she did not  know that number. Patient came into the office and I gave him this information. Patient verbalized an understanding and will let us know what he finds out.

## 2021-02-09 DIAGNOSIS — M19011 Primary osteoarthritis, right shoulder: Secondary | ICD-10-CM | POA: Diagnosis not present

## 2021-02-10 ENCOUNTER — Telehealth: Payer: Self-pay | Admitting: *Deleted

## 2021-02-10 NOTE — Telephone Encounter (Signed)
° °  Pre-operative Risk Assessment    Patient Name: Matthew [name]  DOB: 03-17-1940 MRN: [id number]      Request for Surgical Clearance    Procedure:   RIGHT REVERSE SHOULDER ARTHROPLASTY  Date of Surgery:  Clearance 03/31/21                                 Surgeon:  DR. Lennette Bihari SUPPLE Surgeon's Group or Practice Name:  [facility] Phone number:  [phone] Fax number:  [phone] ATTN: [name]   Type of Clearance Requested:   - Medical  - Pharmacy:  Hold Clopidogrel (Plavix)     Type of Anesthesia:  General    Additional requests/questions:    [name]   02/10/2021, 5:51 PM

## 2021-02-11 NOTE — Telephone Encounter (Signed)
° °  Primary Cardiologist: Lauree Chandler, MD  Chart reviewed as part of pre-operative protocol coverage. Given past medical history and time since last visit, based on ACC/AHA guidelines, Matthew [name] would be at acceptable risk for the planned procedure without further cardiovascular testing.   His RCRI is a class II risk, 0.9% risk of major cardiac event.  He is able to complete greater than 4 METS of physical activity.  His Plavix may be held for 5 days prior to his procedure.  Please resume as soon as hemostasis is achieved.  Patient was advised that if he develops new symptoms prior to surgery to contact our office to arrange a follow-up appointment.  He verbalized understanding.  I will route this recommendation to the requesting party via Epic fax function and remove from pre-op pool.  Please call with questions.  Jossie Ng. Shonte Beutler NP-C    02/11/2021, 12:44 PM [name] [address] Office [phone] Fax [phone]

## 2021-02-11 NOTE — Telephone Encounter (Signed)
Gilda Crease 81 year old male is requesting preoperative cardiac evaluation for right reverse shoulder arthroplasty.  He was last seen in the clinic on 09/08/2020.  During that time he continued to do well.  He denied cardiac symptoms.  Follow-up was scheduled for 12 months.  His PMH includes coronary artery disease with PCI and DES to his LAD and RCA 11/18.  He underwent cardiac catheterization 9/21 which showed stable CAD.  His LV systolic function was normal at that time.  His PMH also includes hyperlipidemia, HTN, arthritis, DDD, GERD, and RBBB.  May his Plavix be held prior to his surgery?  Thank you for your help.  Please direct your response to CV [facility] preop pool.   Jossie Ng. Emma-Lee Oddo NP-C    02/11/2021, 11:39 AM [place] [address] Office [phone] Fax [phone]

## 2021-03-18 NOTE — Progress Notes (Addendum)
PCP - Dr.  Atilano Median Cardiologist - Clearance Annamaria Boots 02-11-21   PPM/ICD -  Device Orders -  Rep Notified -   Chest x-ray -  EKG - 03-31-21 Stress Test - 2020 ECHO - 2018 Cardiac Cath - 10-10-2019  Sleep Study -  CPAP -   Fasting Blood Sugar - 119-120 Checks Blood Sugar _several ___ times a day  Blood Thinner Instructions:  Plavix hold 5 days prior Aspirin Instructions:  ERAS Protcol - PRE-SURGERY G2-   COVID TEST- N/A COVID vaccine - Fully vaccinated   Activity--Able to walk to mailbox without SOB Anesthesia review: CAD/stent, MI, DM,HTN  Patient denies shortness of breath, fever, cough and chest pain at PAT appointment   All instructions explained to the patient, with a verbal understanding of the material. Patient agrees to go over the instructions while at home for a better understanding. Patient also instructed to self quarantine after being tested for COVID-19. The opportunity to ask questions was provided.

## 2021-03-18 NOTE — Patient Instructions (Addendum)
DUE TO COVID-19 ONLY ONE VISITOR IS ALLOWED TO COME WITH YOU AND STAY IN THE WAITING ROOM ONLY DURING PRE OP AND PROCEDURE DAY OF SURGERY.             Your procedure is scheduled on: 03-31-21   Report to [facility] Main  Entrance   Report to admitting at          [place] AM     Call this number if you have problems the morning of surgery [phone]   Remember: NO SOLID FOOD AFTER MIDNIGHT THE NIGHT PRIOR TO SURGERY. NOTHING BY MOUTH EXCEPT CLEAR LIQUIDS UNTIL    0430 am  . PLEASE FINISH g2   DRINK PER SURGEON ORDER  WHICH NEEDS TO BE COMPLETED AT        0430 am then nothing by mouth.     CLEAR LIQUID DIET                                                                    water Black Coffee and tea, regular and decaf No Creamer                            Plain Jell-O any favor except red or purple                                  Fruit ices (not with fruit pulp)                                      Iced Popsicles                                     Carbonated beverages, regular and diet                                    Cranberry, grape and apple juices Sports drinks like Gatorade Lightly seasoned clear broth or consume(fat free) Sugar, honey syrup   _____________________________________________________________________      BRUSH YOUR TEETH MORNING OF SURGERY AND RINSE YOUR MOUTH OUT, NO CHEWING GUM CANDY OR MINTS.     Take these medicines the morning of surgery with A SIP OF WATER: pantoprazole, metoprolol, isorbide mononitrate, amlodipine  DO NOT TAKE ANY DIABETIC MEDICATIONS DAY OF YOUR SURGERY  How to Manage Your Diabetes Before and After Surgery  Why is it important to control my blood sugar before and after surgery? Improving blood sugar levels before and after surgery helps healing and can limit problems. A way of improving blood sugar control is eating a healthy diet by:  Eating less sugar and carbohydrates  Increasing activity/exercise  Talking with  your doctor about reaching your blood sugar goals High blood sugars (greater than 180 mg/dL) can raise your risk of infections and slow your recovery, so you will need to focus on controlling your diabetes during the weeks before surgery. Make sure that  the doctor who takes care of your diabetes knows about your planned surgery including the date and location.  How do I manage my blood sugar before surgery? Check your blood sugar at least 4 times a day, starting 2 days before surgery, to make sure that the level is not too high or low. Check your blood sugar the morning of your surgery when you wake up and every 2 hours until you get to the Short Stay unit. If your blood sugar is less than 70 mg/dL, you will need to treat for low blood sugar: Do not take insulin. Treat a low blood sugar (less than 70 mg/dL) with  cup of clear juice (cranberry or apple), 4 glucose tablets, OR glucose gel. Recheck blood sugar in 15 minutes after treatment (to make sure it is greater than 70 mg/dL). If your blood sugar is not greater than 70 mg/dL on recheck, call [phone] for further instructions. Report your blood sugar to the short stay nurse when you get to Short Stay.  If you are admitted to the hospital after surgery: Your blood sugar will be checked by the staff and you will probably be given insulin after surgery (instead of oral diabetes medicines) to make sure you have good blood sugar levels. The goal for blood sugar control after surgery is 80-180 mg/dL.   WHAT DO I DO ABOUT MY DIABETES MEDICATION?  Do not take oral diabetes medicines (pills) the morning of surgery.  The day before surgery  take 100% of 70-30 am dose and 70% of dinner dose   or contact PCP or endocrinologist       THE MORNING OF SURGERY, take  0  units of    70-30      insulin.  The day of surgery, do not take other diabetes injectables, including Byetta (exenatide), Bydureon (exenatide ER), Victoza (liraglutide), or  Trulicity (dulaglutide).                                 You may not have any metal on your body including hair pins and              piercings  Do not wear jewelry, lotions, powders,perfumes,        deodorant                         Men may shave face and neck.   Do not bring valuables to the hospital. Kim.  Contacts, dentures or bridgework may not be worn into surgery.  You may bring a small overnight bag with you     Patients discharged the day of surgery will not be allowed to drive home. IF YOU ARE HAVING SURGERY AND GOING HOME THE SAME DAY, YOU MUST HAVE AN ADULT TO DRIVE YOU HOME AND BE WITH YOU FOR 24 HOURS. YOU MAY GO HOME BY TAXI OR UBER OR ORTHERWISE, BUT AN ADULT MUST ACCOMPANY YOU HOME AND STAY WITH YOU FOR 24 HOURS.  Name and phone number of your driver:  Special Instructions: N/A              Please read over the following fact sheets you were given: _____________________________________________________________________             [facility] - Preparing for Surgery  Before surgery, you can play an important role.  Because skin is not sterile, your skin needs to be as free of germs as possible.  You can reduce the number of germs on your skin by washing with CHG (chlorahexidine gluconate) soap before surgery.  CHG is an antiseptic cleaner which kills germs and bonds with the skin to continue killing germs even after washing. Please DO NOT use if you have an allergy to CHG or antibacterial soaps.  If your skin becomes reddened/irritated stop using the CHG and inform your nurse when you arrive at Short Stay. Do not shave (including legs and underarms) for at least 48 hours prior to the first CHG shower.  You may shave your face/neck. Please follow these instructions carefully:  1.  Shower with CHG Soap the night before surgery and the  morning of Surgery.  2.  If you choose to wash your hair, wash your hair first as  usual with your  normal  shampoo.  3.  After you shampoo, rinse your hair and body thoroughly to remove the  shampoo.                           4.  Use CHG as you would any other liquid soap.  You can apply chg directly  to the skin and wash                       Gently with a scrungie or clean washcloth.  5.  Apply the CHG Soap to your body ONLY FROM THE NECK DOWN.   Do not use on face/ open                           Wound or open sores. Avoid contact with eyes, ears mouth and genitals (private parts).                       Wash face,  Genitals (private parts) with your normal soap.             6.  Wash thoroughly, paying special attention to the area where your surgery  will be performed.  7.  Thoroughly rinse your body with warm water from the neck down.  8.  DO NOT shower/wash with your normal soap after using and rinsing off  the CHG Soap.                9.  Pat yourself dry with a clean towel.            10.  Wear clean pajamas.            11.  Place clean sheets on your bed the night of your first shower and do not  sleep with pets. Day of Surgery : Do not apply any lotions/deodorants the morning of surgery.  Please wear clean clothes to the hospital/surgery center.  FAILURE TO FOLLOW THESE INSTRUCTIONS MAY RESULT IN THE CANCELLATION OF YOUR SURGERY PATIENT SIGNATURE_________________________________  NURSE SIGNATURE__________________________________  ________________________________________________________________________    Adam Phenix  An incentive spirometer is a tool that can help keep your lungs clear and active. This tool measures how well you are filling your lungs with each breath. Taking long deep breaths may help reverse or decrease the chance of developing breathing (pulmonary) problems (especially infection) following: A long period of time when you are unable to move  or be active. BEFORE THE PROCEDURE  If the spirometer includes an indicator to show your best  effort, your nurse or respiratory therapist will set it to a desired goal. If possible, sit up straight or lean slightly forward. Try not to slouch. Hold the incentive spirometer in an upright position. INSTRUCTIONS FOR USE  Sit on the edge of your bed if possible, or sit up as far as you can in bed or on a chair. Hold the incentive spirometer in an upright position. Breathe out normally. Place the mouthpiece in your mouth and seal your lips tightly around it. Breathe in slowly and as deeply as possible, raising the piston or the ball toward the top of the column. Hold your breath for 3-5 seconds or for as long as possible. Allow the piston or ball to fall to the bottom of the column. Remove the mouthpiece from your mouth and breathe out normally. Rest for a few seconds and repeat Steps 1 through 7 at least 10 times every 1-2 hours when you are awake. Take your time and take a few normal breaths between deep breaths. The spirometer may include an indicator to show your best effort. Use the indicator as a goal to work toward during each repetition. After each set of 10 deep breaths, practice coughing to be sure your lungs are clear. If you have an incision (the cut made at the time of surgery), support your incision when coughing by placing a pillow or rolled up towels firmly against it. Once you are able to get out of bed, walk around indoors and cough well. You may stop using the incentive spirometer when instructed by your caregiver.  RISKS AND COMPLICATIONS Take your time so you do not get dizzy or light-headed. If you are in pain, you may need to take or ask for pain medication before doing incentive spirometry. It is harder to take a deep breath if you are having pain. AFTER USE Rest and breathe slowly and easily. It can be helpful to keep track of a log of your progress. Your caregiver can provide you with a simple table to help with this. If you are using the spirometer at home, follow  these instructions: [place] IF:  You are having difficultly using the spirometer. You have trouble using the spirometer as often as instructed. Your pain medication is not giving enough relief while using the spirometer. You develop fever of 100.5 F (38.1 C) or higher. SEEK IMMEDIATE MEDICAL CARE IF:  You cough up bloody sputum that had not been present before. You develop fever of 102 F (38.9 C) or greater. You develop worsening pain at or near the incision site. MAKE SURE YOU:  Understand these instructions. Will watch your condition. Will get help right away if you are not doing well or get worse. Document Released: 06/05/2006 Document Revised: 04/17/2011 Document Reviewed: 08/06/2006 ExitCare Patient Information 2014 [place].   ________[facility]- Preparing for Total Shoulder Arthroplasty    Before surgery, you can play an important role. Because skin is not sterile, your skin needs to be as free of germs as possible. You can reduce the number of germs on your skin by using the following products. Benzoyl Peroxide Gel Reduces the number of germs present on the skin Applied twice a day to shoulder area starting two days before surgery    ==================================================================  Please follow these instructions carefully:  BENZOYL PEROXIDE 5% GEL  Please do not use if you have an  allergy to benzoyl peroxide.   If your skin becomes reddened/irritated stop using the benzoyl peroxide.  Starting two days before surgery, apply as follows: Apply benzoyl peroxide in the morning and at night. Apply after taking a shower. If you are not taking a shower clean entire shoulder front, back, and side along with the armpit with a clean wet washcloth.  Place a quarter-sized dollop on your shoulder and rub in thoroughly, making sure to cover the front, back, and side of your shoulder, along with the armpit.   2 days before ____ AM   ____ PM               1 day before ____ AM   ____ PM                         Do this twice a day for two days.  (Last application is the night before surgery, AFTER using the CHG soap as described below).  Do NOT apply benzoyl peroxide gel on the day of surgery. ________________________________________________________________

## 2021-03-21 ENCOUNTER — Encounter (HOSPITAL_COMMUNITY)
Admission: RE | Admit: 2021-03-21 | Discharge: 2021-03-21 | Disposition: A | Discharge status: Home / Self Care | Payer: Medicare Other | Source: Ambulatory Visit | Attending: Orthopedic Surgery | Admitting: Orthopedic Surgery

## 2021-03-21 ENCOUNTER — Other Ambulatory Visit: Payer: Self-pay

## 2021-03-21 ENCOUNTER — Encounter (HOSPITAL_COMMUNITY): Payer: Self-pay

## 2021-03-21 VITALS — BP 172/95 | HR 59 | Temp 98.3°F | Resp 16 | Ht 68.0 in

## 2021-03-21 DIAGNOSIS — E1122 Type 2 diabetes mellitus with diabetic chronic kidney disease: Secondary | ICD-10-CM | POA: Insufficient documentation

## 2021-03-21 DIAGNOSIS — I129 Hypertensive chronic kidney disease with stage 1 through stage 4 chronic kidney disease, or unspecified chronic kidney disease: Secondary | ICD-10-CM | POA: Insufficient documentation

## 2021-03-21 DIAGNOSIS — N183 Chronic kidney disease, stage 3 unspecified: Secondary | ICD-10-CM | POA: Diagnosis not present

## 2021-03-21 DIAGNOSIS — Z7902 Long term (current) use of antithrombotics/antiplatelets: Secondary | ICD-10-CM | POA: Diagnosis not present

## 2021-03-21 DIAGNOSIS — Z01818 Encounter for other preprocedural examination: Secondary | ICD-10-CM | POA: Diagnosis not present

## 2021-03-21 DIAGNOSIS — Z955 Presence of coronary angioplasty implant and graft: Secondary | ICD-10-CM | POA: Insufficient documentation

## 2021-03-21 DIAGNOSIS — I4510 Unspecified right bundle-branch block: Secondary | ICD-10-CM | POA: Insufficient documentation

## 2021-03-21 DIAGNOSIS — I2510 Atherosclerotic heart disease of native coronary artery without angina pectoris: Secondary | ICD-10-CM | POA: Diagnosis not present

## 2021-03-21 DIAGNOSIS — I10 Essential (primary) hypertension: Secondary | ICD-10-CM

## 2021-03-21 DIAGNOSIS — E1159 Type 2 diabetes mellitus with other circulatory complications: Secondary | ICD-10-CM | POA: Insufficient documentation

## 2021-03-21 DIAGNOSIS — M19011 Primary osteoarthritis, right shoulder: Secondary | ICD-10-CM | POA: Diagnosis not present

## 2021-03-21 HISTORY — DX: Myoneural disorder, unspecified: G70.9

## 2021-03-21 LAB — CBC
HCT: 38.6 % — ABNORMAL LOW (ref 39.0–52.0)
Hemoglobin: 12.4 g/dL — ABNORMAL LOW (ref 13.0–17.0)
MCH: 30 pg (ref 26.0–34.0)
MCHC: 32.1 g/dL (ref 30.0–36.0)
MCV: 93.2 fL (ref 80.0–100.0)
Platelets: 200 10*3/uL (ref 150–400)
RBC: 4.14 MIL/uL — ABNORMAL LOW (ref 4.22–5.81)
RDW: 12.7 % (ref 11.5–15.5)
WBC: 5 10*3/uL (ref 4.0–10.5)
nRBC: 0 % (ref 0.0–0.2)

## 2021-03-21 LAB — BASIC METABOLIC PANEL
Anion gap: 9 (ref 5–15)
BUN: 19 mg/dL (ref 8–23)
CO2: 25 mmol/L (ref 22–32)
Calcium: 9.4 mg/dL (ref 8.9–10.3)
Chloride: 105 mmol/L (ref 98–111)
Creatinine, Ser: 1.2 mg/dL (ref 0.61–1.24)
GFR, Estimated: >60 mL/min (ref >60)
Glucose, Bld: 133 mg/dL — ABNORMAL HIGH (ref 70–99)
Potassium: 4.8 mmol/L (ref 3.5–5.1)
Sodium: 139 mmol/L (ref 135–145)

## 2021-03-21 LAB — GLUCOSE, CAPILLARY: Glucose-Capillary: 142 mg/dL — ABNORMAL HIGH (ref 70–99)

## 2021-03-21 LAB — HEMOGLOBIN A1C
Hgb A1c MFr Bld: 6.1 % — ABNORMAL HIGH (ref 4.8–5.6)
Mean Plasma Glucose: 128.37 mg/dL

## 2021-03-21 LAB — SURGICAL PCR SCREEN
MRSA, PCR: NEGATIVE
Staphylococcus aureus: NEGATIVE

## 2021-03-22 NOTE — Anesthesia Preprocedure Evaluation (Addendum)
Anesthesia Evaluation  Patient identified by MRN, date of birth, ID band Patient awake    Reviewed: Allergy & Precautions, NPO status , Patient's Chart, lab work & pertinent test results  Airway Mallampati: II       Dental no notable dental hx.    Pulmonary    Pulmonary exam normal        Cardiovascular hypertension, Pt. on medications and Pt. on home beta blockers + CAD, + Past MI and + Cardiac Stents  Normal cardiovascular exam     Neuro/Psych    GI/Hepatic GERD  Medicated,  Endo/Other  diabetes, Type 2, Oral Hypoglycemic Agents  Renal/GU      Musculoskeletal  (+) Arthritis , Osteoarthritis,    Abdominal Normal abdominal exam  (+)   Peds  Hematology  (+) Blood dyscrasia, anemia ,   Anesthesia Other Findings ? Mid Cx lesion is 70% stenosed. ? 1st Diag lesion is 40% stenosed. ? Previously placed Prox LAD to Mid LAD stent (unknown type) is widely patent. ? Previously placed Prox RCA to Mid RCA stent (unknown type) is widely patent. ? RV Branch lesion is 95% stenosed. ? Prox RCA lesion is 50% stenosed. ? RPDA lesion is 60% stenosed. ? 3rd RPL lesion is 20% stenosed. ? The left ventricular systolic function is normal. ? LV end diastolic pressure is normal. ? The left ventricular ejection fraction is 55-65% by visual estimate.   Very long left main coronary artery which gives rise to a large LAD and very small caliber circumflex vessel.   The previously placed tandem stents in the LAD are widely patent.  There is mild 40% narrowing in a diagonal vessel proximal to the stented segment.    The left circumflex vessel is very small caliber with narrowing 70- 75% proximally which does not appear to be significantly change from the prior study.  The RCA is a large dominant vessel.  The stent in the proximal to mid RCA is widely patent.  The very proximal RCA has 50% narrowing and gives rise to a SA nodal artery.   There is 90-95% ostial stenosis in this small proximal branch, the PDA has previously noted diffuse 60% proximal to mid stenosis, and the PLA vessel site of prior PTCA is patent with residual narrowing less than 20%.  Normal LV function with EF estimate approximately 60%.  LVEDP 15 mmHg.  RECOMMENDATION: There is no significant change in the previously stented segments of the LAD and RCA with patent PTCA site of the PLA vessel..  There does appear to be progression of disease in the very proximal RCA which gives rise to a small branch with 90-95% ostial stenosis.  Circumflex vessel is very small caliber vessel.  Recommend increase medical therapy trial; your had just been increased to 90 mg daily.  Depending upon heart rate, consider further titration of beta-blocker therapy.  The patient is on PCSK9 inhibition in addition to Zetia for aggressive lipid-lowering.  Recommendations  Antiplatelet/Anticoag Recommend dual antiplatelet therapy. He has been on aspirin/Plavix, would continue long-term.  Indications  Angina pectoris ([place]) (I20.9 (ICD-10-CM))  Procedural Details  Technical Details [name] is a 81 year old gentleman who has a history of hypertension, diabetes mellitus, hyperlipidemia, and CAD. He has previously undergone stenting of tandem stenoses in his proximal to mid LAD as well as stenting of his proximal to mid RCA and PTCA of his PLA vessel.  He has been on chronic aspirin Plavix therapy.  Recently developed several episodes of chest discomfort  which were nitrate responsive.  He was evaluated in the office this week by Kathrynn Humble, NP and is referred for definitive repeat cardiac catheterization.  The patient was brought to the cardiac catheterization lab in the fasting state. The patient was premedicated with Versed 2 mg and fentanyl 50  mcg.  Percent guidance was used for right radial access.  The right radial artery was punctured via the Seldinger technique, and a 6  [place] Glidesheath Slender was inserted without difficulty.  A radial cocktail consisting of Verapamil 3 mg was administered. The patient received 4500 units of weight adjusted heparin. A safety J wire was advanced into the ascending aorta. Diagnostic catheterization was done with a 5 [place] TIG 4.0 catheter. A 5 [place] JR4 catheter was used for left ventriculography.  Old films were brought up for review for comparative purposes.  A TR radial band was applied for hemostasis. The patient left the catheterization laboratory in stable condition.   Estimated blood loss <50 mL.   During this procedure medications were administered to achieve and maintain moderate conscious sedation while the patient's heart rate, blood pressure, and oxygen saturation were continuously monitored and I was present face-to-face 100% of this time.  Medications (Filter: Administrations occurring from 0724 to 0849 on 10/10/19) important Continuous medications are totaled by the amount administered until 10/10/19 0849.   Heparin (Porcine) in NaCl 1000-0.9 UT/500ML-% SOLN (mL) Total volume:  1,000 mL  Date/Time Rate/Dose/Volume Action  10/10/19 0743 500 mL Given  0743 500 mL Given   fentaNYL (SUBLIMAZE) injection (mcg) Total dose:  50 mcg  Date/Time Rate/Dose/Volume Action  10/10/19 0743 25 mcg Given  0800 25 mcg Given   midazolam (VERSED) injection (mg) Total dose:  2 mg  Date/Time Rate/Dose/Volume Action  10/10/19 0743 1 mg Given  0800 1 mg Given   lidocaine (PF) (XYLOCAINE) 1 % injection (mL) Total volume:  2 mL  Date/Time Rate/Dose/Volume Action  10/10/19 0756 2 mL Given   Radial Cocktail/Verapamil only (mL) Total volume:  10 mL  Date/Time Rate/Dose/Volume Action  10/10/19 0801 10 mL Given   heparin sodium (porcine) injection (Units) Total dose:  4,500 Units  Date/Time Rate/Dose/Volume Action  10/10/19 0816 4,500 Units Given   iohexol (OMNIPAQUE) 350 MG/ML injection (mL) Total volume:  80  mL  Date/Time Rate/Dose/Volume Action  10/10/19 0839 80 mL Given                          Reproductive/Obstetrics                           Anesthesia Physical Anesthesia Plan  ASA: 3  Anesthesia Plan: General   Post-op Pain Management: Regional block*   Induction: Intravenous  PONV Risk Score and Plan: 2 and Ondansetron, Midazolam and Treatment may vary due to age or medical condition  Airway Management Planned: Oral ETT  Additional Equipment: None  Intra-op Plan:   Post-operative Plan: Extubation in OR  Informed Consent: I have reviewed the patients History and Physical, chart, labs and discussed the procedure including the risks, benefits and alternatives for the proposed anesthesia with the patient or authorized representative who has indicated his/her understanding and acceptance.     Consent reviewed with POA and Dental advisory given  Plan Discussed with: CRNA  Anesthesia Plan Comments: (See PAT note 03/21/2021, Konrad Felix Ward, PA-C)      Anesthesia Quick Evaluation  Anesthesia Evaluation  Patient identified by MRN, date of birth, ID band Patient awake    Reviewed: Allergy & Precautions, NPO status , Patient's Chart, lab work & pertinent test results, reviewed documented beta blocker date and time   History of Anesthesia Complications Negative for: history of anesthetic complications  Airway Mallampati: II  TM Distance: >3 FB Neck ROM: Full    Dental  (+) Dental Advisory Given   Pulmonary  08/24/2020 SARS coronavirus NEG   breath sounds clear to auscultation       Cardiovascular hypertension, Pt. on medications and Pt. on home beta blockers (-) angina+ CAD, + Past MI, + Cardiac Stents and + Peripheral Vascular Disease   Rhythm:Regular Rate:Normal  '21 Cath: EF 60% with normal LVF, There is no significant change in the previously stented segments of the  LAD and RCA with patent PTCA site of the PLA vessel..  There does appear to be progression of disease in the very proximal RCA which gives rise to a small branch with 90-95% ostial stenosis.  Circumflex vessel is very small caliber vessel. Medical management  '20 Nuclear stress EF: 69%. There was no ST segment deviation noted during stress. No T wave inversion was noted during stress. This is a low risk study. EF is hyperdynamic (>65%).      Neuro/Psych negative neurological ROS     GI/Hepatic Neg liver ROS, GERD  Controlled,  Endo/Other  diabetes (glu 176), Insulin Dependentobese  Renal/GU Renal InsufficiencyRenal disease (creat 1.37)     Musculoskeletal  (+) Arthritis ,   Abdominal (+) + obese,   Peds  Hematology negative hematology ROS (+) plavix   Anesthesia Other Findings   Reproductive/Obstetrics                           Anesthesia Physical Anesthesia Plan  ASA: 3  Anesthesia Plan: Spinal   Post-op Pain Management:  Regional for Post-op pain   Induction:   PONV Risk Score and Plan: 1 and Ondansetron  Airway Management Planned: Natural Airway and Simple Face Mask  Additional Equipment: None  Intra-op Plan:   Post-operative Plan:   Informed Consent: I have reviewed the patients History and Physical, chart, labs and discussed the procedure including the risks, benefits and alternatives for the proposed anesthesia with the patient or authorized representative who has indicated his/her understanding and acceptance.     Dental advisory given  Plan Discussed with: CRNA and Surgeon  Anesthesia Plan Comments: (See PAT note 08/17/2020, Konrad Felix, PA-C Plan routine monitors, SAB with adductor canal block for post op analgesia)      Anesthesia Quick Evaluation

## 2021-03-22 NOTE — Progress Notes (Signed)
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 never smoker with h/o HTN, CKD Stage III, RBBB, CAD (DES), right shoulder OA scheduled for above procedure 03/31/2021 with Dr. Justice Britain.   Per cardiology preoperative evaluation 02/11/2021, "Chart reviewed as part of pre-operative protocol coverage. Given past medical history and time since last visit, based on ACC/AHA guidelines, Matthew [name] would be at acceptable risk for the planned procedure without further cardiovascular testing.    His RCRI is a class II risk, 0.9% risk of major cardiac event.  He is able to complete greater than 4 METS of physical activity.   His Plavix may be held for 5 days prior to his procedure.  Please resume as soon as hemostasis is achieved."  Anticipate pt can proceed with planned procedure barring acute status change.   VS: BP (!) 172/95    Pulse (!) 59    Temp 36.8 C (Oral)    Resp 16    Ht 5\' 8"  (1.727 m)    SpO2 100%    BMI 28.80 kg/m   PROVIDERS: Wenda Low, MD is PCP   Primary Cardiologist: Lauree Chandler, MD  LABS: Labs reviewed: Acceptable for surgery. (all labs ordered are listed, but only abnormal results are displayed)  Labs Reviewed  HEMOGLOBIN A1C - Abnormal; Notable for the following components:      Result Value   Hgb A1c MFr Bld 6.1 (*)    All other components within normal limits  BASIC METABOLIC PANEL - Abnormal; Notable for the following components:   Glucose, Bld 133 (*)    All other components within normal limits  CBC - Abnormal; Notable for the following components:   RBC 4.14 (*)    Hemoglobin 12.4 (*)    HCT 38.6 (*)    All other components within normal limits   GLUCOSE, CAPILLARY - Abnormal; Notable for the following components:   Glucose-Capillary 142 (*)    All other components within normal limits  SURGICAL PCR SCREEN     IMAGES:   EKG: 03/21/2021 Rate 54 bpm  Sinus bradycardia Right bundle branch block Abnormal ECG  CV: Stress Test 04/15/2018 Nuclear stress EF: 69%. There was no ST segment deviation noted during stress. No T wave inversion was noted during stress. This is a low risk study. The left ventricular ejection fraction is hyperdynamic (>65%).  Echo 12/26/2016 Study Conclusions   - Left ventricle: The cavity size was normal. Wall thickness was    normal. Systolic function was normal. The estimated ejection    fraction was in the range of 60% to 65%. Wall motion was normal;    there were no regional wall motion abnormalities. Left    ventricular diastolic function parameters were normal.  - Mitral valve: Calcified annulus.  - Atrial septum: No defect or patent foramen ovale was identified.  - Pulmonary arteries: PA peak pressure: 36 mm Hg (S).  Past Medical History:  Diagnosis Date   Anticoagulated    plavix   Arthritis    Shoulder, knees, back    CAD in native artery cardiologist-  dr Angelena Form   a. CAD/NSTEMI ,  cardiac cath staged stenting-- 12-25-2016  PTCA and DES x3 to  prox. and mid LAD;  12-26-2016  PCI to PLA and DES x1 to midRCA,  EF 60-65%.   Chronic lower back pain    CKD (chronic kidney disease), stage III (HCC)    Complication of anesthesia    "Too much with shoulder surgery", pt. reports that he was told the at they "lost him, due to absorbing too much anesthesia".  shoulder surgery 1985   DDD (degenerative disc disease), lumbosacral    GERD (gastroesophageal reflux disease)    History of gastric ulcer 01/2017   History of kidney stones    History of malignant melanoma    right side of nose   History of non-ST elevation myocardial infarction (NSTEMI) 12/23/2017   s/p  staged cardiac cath,  s/p   PCI and DEStenting   Hyperlipidemia    Hypertension    IDA (iron deficiency anemia)    Myocardial infarction ([place])    x 2   Neuromuscular disorder (HCC)    neuropathy   Nocturia    RBBB    Renal calculus, right    S/P drug eluting coronary stent placement 12-25-2017,  12-26-2017   PTCA and DES x3 to prox. and mid LAD;  PCI to PLA and DES x1 to midRCA   Type 2 diabetes mellitus treated with insulin ([place])    followed by pcp    Past Surgical History:  Procedure Laterality Date   ANAL FISSURE REPAIR  X 2   BACK SURGERY     CATARACT EXTRACTION W/ INTRAOCULAR LENS  IMPLANT, BILATERAL Bilateral 2017;  2015   COLONOSCOPY WITH PROPOFOL N/A 06/05/2016   Procedure: COLONOSCOPY WITH PROPOFOL;  Surgeon: Garlan Fair, MD;  Location: [facility];  Service: Endoscopy;  Laterality: N/A;   CONVERSION TO TOTAL KNEE Right 08/25/2020   Procedure: Revision right knee unicompartmental arthroplasty to total knee arthroplasty;  Surgeon: Gaynelle Arabian, MD;  Location: [facility];  Service: Orthopedics;  Laterality: Right;   CORONARY ANGIOGRAPHY N/A 12/26/2016   Procedure: CORONARY ANGIOGRAPHY;  Surgeon: Troy Sine, MD;  Location: [place] [facility];  Service: Cardiovascular;  Laterality: N/A;   CORONARY STENT INTERVENTION N/A 12/25/2016   Procedure: CORONARY STENT INTERVENTION;  Surgeon: Burnell Blanks, MD;  Location: [place] [facility];  Service: Cardiovascular;  Laterality: N/A;   CORONARY STENT INTERVENTION N/A 12/26/2016   Procedure: CORONARY STENT INTERVENTION;  Surgeon: Troy Sine, MD;  Location: [place] [facility];  Service: Cardiovascular;  Laterality: N/A;   CYSTOSCOPY/URETEROSCOPY/HOLMIUM LASER/STENT PLACEMENT Right 01/07/2018   Procedure: RIGHT URETEROSCOPY/HOLMIUM LASER/STENT PLACEMENT;  Surgeon: Lucas Mallow, MD;  Location: [facility];  Service: Urology;  Laterality: Right;   ESOPHAGOGASTRODUODENOSCOPY (EGD) WITH PROPOFOL N/A 01/12/2017   Procedure:  ESOPHAGOGASTRODUODENOSCOPY (EGD) WITH PROPOFOL;  Surgeon: Wilford Corner, MD;  Location: [place];  Service: Endoscopy;  Laterality: N/A;   HAND TENDON SURGERY Right 10-29-2002   dr Burney Gauze @[facility]    right index and thumb   KNEE ARTHROSCOPY Bilateral 2009-;2010   @[facility]    LEFT HEART CATH AND CORONARY ANGIOGRAPHY N/A 12/25/2016   Procedure: LEFT HEART CATH AND CORONARY ANGIOGRAPHY;  Surgeon: Burnell Blanks, MD;  Location: [place] [facility];  Service: Cardiovascular;  Laterality: N/A;   LEFT HEART CATH AND CORONARY ANGIOGRAPHY N/A 10/10/2019   Procedure: LEFT HEART CATH AND CORONARY ANGIOGRAPHY;  Surgeon: Troy Sine, MD;  Location: [place] [facility];  Service: Cardiovascular;  Laterality: N/A;   LUMBAR LAMINECTOMY/DECOMPRESSION MICRODISCECTOMY N/A 06/09/2015   Procedure: Laminectomy -  T12-L1;  Surgeon: Eustace Moore, MD;  Location: [place] [facility];  Service: Neurosurgery;  Laterality: N/A;  Laminectomy - T12-L1   MEDIAL PARTIAL KNEE REPLACEMENT Bilateral 2009-2010    [facility]    MELANOMA EXCISION Right    "side of my nose"   SHOULDER SURGERY Left 1985   TOTAL SHOULDER ARTHROPLASTY Left 12/06/2012   Procedure: LEFT TOTAL SHOULDER ARTHROPLASTY VERSES A REVERSE TOTAL SHOULDER ARTHROPLASTY;  Surgeon: Augustin Schooling, MD;  Location: [place];  Service: Orthopedics;  Laterality: Left;    MEDICATIONS:  acetaminophen (TYLENOL) 325 MG tablet   Alogliptin Benzoate 12.5 MG TABS   amLODipine (NORVASC) 10 MG tablet   amoxicillin (AMOXIL) 500 MG capsule   clopidogrel (PLAVIX) 75 MG tablet   Continuous Blood Gluc Receiver (FREESTYLE LIBRE 2 READER) DEVI   Continuous Blood Gluc Sensor (FREESTYLE LIBRE 2 SENSOR) MISC   ezetimibe (ZETIA) 10 MG tablet   HYDROmorphone (DILAUDID) 2 MG tablet   insulin aspart protamine - aspart (NOVOLOG 70/30 FLEXPEN RELION) (70-30) 100 UNIT/ML FlexPen   iron polysaccharides (NIFEREX) 150 MG capsule   isosorbide mononitrate (IMDUR) 60 MG 24 hr tablet    losartan (COZAAR) 50 MG tablet   metFORMIN (GLUCOPHAGE XR) 500 MG 24 hr tablet   methocarbamol (ROBAXIN) 500 MG tablet   metoprolol tartrate (LOPRESSOR) 25 MG tablet   nitroGLYCERIN (NITROSTAT) 0.4 MG SL tablet   pantoprazole (PROTONIX) 40 MG tablet   rosuvastatin (CRESTOR) 10 MG tablet   rosuvastatin (CRESTOR) 5 MG tablet   traMADol (ULTRAM) 50 MG tablet   Vitamin D, Cholecalciferol, 25 MCG (1000 UT) CAPS   Zinc 50 MG CAPS   No current facility-administered medications for this encounter.     Konrad Felix Ward, PA-C [facility] [phone]

## 2021-03-23 ENCOUNTER — Other Ambulatory Visit: Payer: Self-pay

## 2021-03-23 ENCOUNTER — Ambulatory Visit: Payer: Medicare Other | Admitting: "Endocrinology

## 2021-03-23 ENCOUNTER — Encounter: Payer: Self-pay | Admitting: "Endocrinology

## 2021-03-23 VITALS — BP 122/64 | HR 56 | Ht 68.0 in | Wt 188.8 lb

## 2021-03-23 DIAGNOSIS — E782 Mixed hyperlipidemia: Secondary | ICD-10-CM

## 2021-03-23 DIAGNOSIS — I10 Essential (primary) hypertension: Secondary | ICD-10-CM | POA: Diagnosis not present

## 2021-03-23 DIAGNOSIS — E1159 Type 2 diabetes mellitus with other circulatory complications: Secondary | ICD-10-CM

## 2021-03-23 MED ORDER — EMPAGLIFLOZIN 10 MG PO TABS: 10.0000 mg | ORAL_TABLET | Freq: Every day | ORAL | 1 refills | Status: DC

## 2021-03-23 NOTE — Progress Notes (Signed)
03/23/2021, 3:04 PM  Endocrinology follow-up note   Subjective:    Patient ID: Matthew [name], male    DOB: 25-Dec-1940.  Matthew [name] is being seen in follow up after he was seen in consultation for management of currently uncontrolled symptomatic diabetes requested by  Wenda Low, MD.   Past Medical History:  Diagnosis Date   Anticoagulated    plavix   Arthritis    Shoulder, knees, back    CAD in native artery cardiologist-  dr Angelena Form   a. CAD/NSTEMI ,  cardiac cath staged stenting-- 12-25-2016  PTCA and DES x3 to prox. and mid LAD;  12-26-2016  PCI to PLA and DES x1 to midRCA,  EF 60-65%.   Chronic lower back pain    CKD (chronic kidney disease), stage III (HCC)    Complication of anesthesia    "Too much with shoulder surgery", pt. reports that he was told the at they "lost him, due to absorbing too much anesthesia".  shoulder surgery 1985   DDD (degenerative disc disease), lumbosacral    GERD (gastroesophageal reflux disease)    History of gastric ulcer 01/2017   History of kidney stones    History of malignant melanoma    right side of nose   History of non-ST elevation myocardial infarction (NSTEMI) 12/23/2017   s/p  staged cardiac cath,  s/p  PCI and DEStenting   Hyperlipidemia    Hypertension    IDA (iron deficiency anemia)    Myocardial infarction ([place])    x 2   Neuromuscular disorder (HCC)    neuropathy   Nocturia    RBBB    Renal calculus, right    S/P drug eluting coronary stent placement 12-25-2017,  12-26-2017   PTCA and DES x3 to prox. and mid LAD;  PCI to PLA and DES x1 to midRCA   Type 2 diabetes mellitus treated with insulin ([place])    followed by pcp    Past Surgical History:  Procedure Laterality Date   ANAL FISSURE REPAIR  X 2   BACK SURGERY     CATARACT EXTRACTION W/ INTRAOCULAR LENS  IMPLANT, BILATERAL Bilateral 2017;  2015   COLONOSCOPY WITH PROPOFOL N/A 06/05/2016   Procedure: COLONOSCOPY WITH PROPOFOL;   Surgeon: Garlan Fair, MD;  Location: [facility];  Service: Endoscopy;  Laterality: N/A;   CONVERSION TO TOTAL KNEE Right 08/25/2020   Procedure: Revision right knee unicompartmental arthroplasty to total knee arthroplasty;  Surgeon: Gaynelle Arabian, MD;  Location: [facility];  Service: Orthopedics;  Laterality: Right;   CORONARY ANGIOGRAPHY N/A 12/26/2016   Procedure: CORONARY ANGIOGRAPHY;  Surgeon: Troy Sine, MD;  Location: [facility];  Service: Cardiovascular;  Laterality: N/A;   CORONARY STENT INTERVENTION N/A 12/25/2016   Procedure: CORONARY STENT INTERVENTION;  Surgeon: Burnell Blanks, MD;  Location: [facility];  Service: Cardiovascular;  Laterality: N/A;   CORONARY STENT INTERVENTION N/A 12/26/2016   Procedure: CORONARY STENT INTERVENTION;  Surgeon: Troy Sine, MD;  Location: [facility];  Service: Cardiovascular;  Laterality: N/A;   CYSTOSCOPY/URETEROSCOPY/HOLMIUM LASER/STENT PLACEMENT Right 01/07/2018   Procedure: RIGHT URETEROSCOPY/HOLMIUM LASER/STENT PLACEMENT;  Surgeon: Lucas Mallow, MD;  Location: [facility];  Service: Urology;  Laterality: Right;   ESOPHAGOGASTRODUODENOSCOPY (EGD) WITH PROPOFOL N/A 01/12/2017   Procedure: ESOPHAGOGASTRODUODENOSCOPY (EGD) WITH PROPOFOL;  Surgeon: Wilford Corner, MD;  Location: [place] [facility];  Service: Endoscopy;  Laterality: N/A;  HAND TENDON SURGERY Right 10-29-2002   dr Burney Gauze @[facility]    right index and thumb   KNEE ARTHROSCOPY Bilateral 2009-;2010   @[facility]    LEFT HEART CATH AND CORONARY ANGIOGRAPHY N/A 12/25/2016   Procedure: LEFT HEART CATH AND CORONARY ANGIOGRAPHY;  Surgeon: Burnell Blanks, MD;  Location: [facility];  Service: Cardiovascular;  Laterality: N/A;   LEFT HEART CATH AND CORONARY ANGIOGRAPHY N/A 10/10/2019   Procedure: LEFT HEART CATH AND CORONARY ANGIOGRAPHY;  Surgeon: Troy Sine, MD;  Location: [facility];  Service: Cardiovascular;   Laterality: N/A;   LUMBAR LAMINECTOMY/DECOMPRESSION MICRODISCECTOMY N/A 06/09/2015   Procedure: Laminectomy - T12-L1;  Surgeon: Eustace Moore, MD;  Location: [facility];  Service: Neurosurgery;  Laterality: N/A;  Laminectomy - T12-L1   MEDIAL PARTIAL KNEE REPLACEMENT Bilateral 2009-2010    [facility]    MELANOMA EXCISION Right    "side of my nose"   SHOULDER SURGERY Left 1985   TOTAL SHOULDER ARTHROPLASTY Left 12/06/2012   Procedure: LEFT TOTAL SHOULDER ARTHROPLASTY VERSES A REVERSE TOTAL SHOULDER ARTHROPLASTY;  Surgeon: Augustin Schooling, MD;  Location: [facility];  Service: Orthopedics;  Laterality: Left;    Social History   Socioeconomic History   Marital status: Married    Spouse name: Not on file   Number of children: Not on file   Years of education: Not on file   Highest education level: Not on file  Occupational History   Not on file  Tobacco Use   Smoking status: Never   Smokeless tobacco: Never  Vaping Use   Vaping Use: Never used  Substance and Sexual Activity   Alcohol use: No   Drug use: No   Sexual activity: Not Currently  Other Topics Concern   Not on file  Social History Narrative   Not on file   Social Determinants of Health   Financial Resource Strain: Not on file  Food Insecurity: Not on file  Transportation Needs: Not on file  Physical Activity: Not on file  Stress: Not on file  Social Connections: Not on file    Family History  Problem Relation Age of Onset   CAD Father    Heart failure Father    CAD Sister    Stroke Mother    Heart failure Mother     Outpatient Encounter Medications as of 03/23/2021  Medication Sig   empagliflozin (JARDIANCE) 10 MG TABS tablet Take 1 tablet (10 mg total) by mouth daily with breakfast.   Omega-3 Fatty Acids (FISH OIL) 1000 MG CAPS Take by mouth daily.   rosuvastatin (CRESTOR) 5 MG tablet Take 1 tablet (5 mg total) by mouth daily.   acetaminophen (TYLENOL) 325 MG tablet Take 2 tablets (650 mg total) by mouth  every 6 (six) hours as needed for mild pain or headache.   Alogliptin Benzoate 12.5 MG TABS Take 12.5 mg by mouth daily.   amLODipine (NORVASC) 10 MG tablet Take 5 mg by mouth in the morning and at bedtime.   amoxicillin (AMOXIL) 500 MG capsule Take 2,000 mg by mouth See admin instructions. Take 2000 mg by mouth 1 hour prior to dental work   clopidogrel (PLAVIX) 75 MG tablet Take 1 tablet (75 mg total) by mouth daily.   Continuous Blood Gluc Receiver (FREESTYLE LIBRE 2 READER) DEVI As directed   Continuous Blood Gluc Sensor (FREESTYLE LIBRE 2 SENSOR) MISC 1 Piece by Does not apply route every 14 (fourteen) days.   ezetimibe (ZETIA) 10 MG tablet  Take 1 tablet (10 mg total) by mouth daily.   iron polysaccharides (NIFEREX) 150 MG capsule Take 150 mg by mouth daily.   isosorbide mononitrate (IMDUR) 60 MG 24 hr tablet Take 1.5 tablets (90 mg total) by mouth daily. (Patient taking differently: Take 60 mg by mouth daily.)   losartan (COZAAR) 50 MG tablet Take 1 tablet (50 mg total) by mouth daily. (Patient taking differently: Take 50 mg by mouth at bedtime.)   metFORMIN (GLUCOPHAGE XR) 500 MG 24 hr tablet Take 1 tablet (500 mg total) by mouth daily with breakfast.   methocarbamol (ROBAXIN) 500 MG tablet Take 1 tablet (500 mg total) by mouth every 6 (six) hours as needed for muscle spasms. (Patient not taking: Reported on 03/15/2021)   metoprolol tartrate (LOPRESSOR) 25 MG tablet TAKE 1 TABLET BY MOUTH TWICE A DAY   nitroGLYCERIN (NITROSTAT) 0.4 MG SL tablet Place 1 tablet (0.4 mg total) under the tongue every 5 (five) minutes as needed for chest pain.   pantoprazole (PROTONIX) 40 MG tablet Take 40 mg by mouth daily.   rosuvastatin (CRESTOR) 10 MG tablet Take 5 mg by mouth at bedtime.   Vitamin D, Cholecalciferol, 25 MCG (1000 UT) CAPS Take 1,000 Units by mouth daily.    Zinc 50 MG CAPS Take 50 mg by mouth daily.   [DISCONTINUED] HYDROmorphone (DILAUDID) 2 MG tablet Take 1 - 2 tablets (2 - 4 mg total) by  mouth every 6 hours as needed for severe pain. (Patient not taking: Reported on 03/15/2021)   [DISCONTINUED] insulin aspart protamine - aspart (NOVOLOG 70/30 FLEXPEN RELION) (70-30) 100 UNIT/ML FlexPen Inject 10 Units into the skin 2 (two) times daily before a meal.   [DISCONTINUED] traMADol (ULTRAM) 50 MG tablet Take 1 - 2 tablets (50-100 mg) by mouth every 6 hours as needed for moderate pain. (Patient not taking: Reported on 03/15/2021)   No facility-administered encounter medications on file as of 03/23/2021.    ALLERGIES: Allergies  Allergen Reactions   Statins Other (See Comments)    Severe stomach pain.   Oxycodone Itching   Hydrocodone Other (See Comments)    "messes with my hearing"   Codeine Itching   Morphine And Related Itching    VACCINATION STATUS: Immunization History  Administered Date(s) Administered   Influenza-Unspecified 10/24/2012   Moderna Sars-Covid-2 Vaccination 02/07/2019, 03/10/2019    Diabetes He presents for his follow-up diabetic visit. He has type 2 diabetes mellitus. Onset time: He was diagnosed at approximate age of 56 years. His disease course has been improving. There are no hypoglycemic associated symptoms. Pertinent negatives for hypoglycemia include no confusion, headaches, pallor or seizures. Pertinent negatives for diabetes include no blurred vision, no chest pain, no fatigue, no polydipsia, no polyphagia, no polyuria and no weakness. There are no hypoglycemic complications. Symptoms are improving. Diabetic complications include heart disease and PVD. Risk factors for coronary artery disease include diabetes mellitus, dyslipidemia, male sex, obesity and sedentary lifestyle. Current diabetic treatment includes insulin injections (He is currently on NovoLog 70/30 26 units daily, alogliptin 12.5 mg p.o. daily.). His weight is stable. He is following a generally unhealthy diet. When asked about meal planning, he reported none. He has had a previous visit with a  dietitian. He participates in exercise intermittently. His home blood glucose trend is decreasing steadily. His breakfast blood glucose range is generally 110-130 mg/dl. His bedtime blood glucose range is generally 130-140 mg/dl. His overall blood glucose range is 130-140 mg/dl. (Mr. Zieger presents with continued improvement in  his glycemic profile.  He is recent A1c was 6.1%, overall improved from 10.9%.  He uses a CGM showing 93% time range, 7% slightly above range.  No hypoglycemia.  He would like to consider Jardiance.   ) An ACE inhibitor/angiotensin II receptor blocker is not being taken. Eye exam is current.  Hyperlipidemia This is a chronic problem. The current episode started more than 1 year ago. The problem is uncontrolled. Exacerbating diseases include diabetes. Associated symptoms include myalgias. Pertinent negatives include no chest pain or shortness of breath. Current antihyperlipidemic treatment includes bile acid sequestrants. Risk factors for coronary artery disease include diabetes mellitus, dyslipidemia, hypertension and male sex.    Review of Systems  Constitutional:  Negative for chills, fatigue, fever and unexpected weight change.  HENT:  Negative for dental problem, mouth sores and trouble swallowing.   Eyes:  Negative for blurred vision and visual disturbance.  Respiratory:  Negative for cough, choking, chest tightness, shortness of breath and wheezing.   Cardiovascular:  Negative for chest pain, palpitations and leg swelling.  Gastrointestinal:  Negative for abdominal distention, abdominal pain, constipation, diarrhea, nausea and vomiting.  Endocrine: Negative for polydipsia, polyphagia and polyuria.  Genitourinary:  Negative for dysuria, flank pain, hematuria and urgency.  Musculoskeletal:  Positive for myalgias. Negative for back pain, gait problem and neck pain.  Skin:  Negative for pallor, rash and wound.  Neurological:  Negative for seizures, syncope, weakness,  numbness and headaches.  Psychiatric/Behavioral:  Negative for confusion and dysphoric mood.    Objective:    Vitals with BMI 03/23/2021 03/21/2021 11/17/2020  Height 5\' 8"  5\' 8"  5\' 8"   Weight 188 lbs 13 oz - 189 lbs 6 oz  BMI 50.53 - 97.6  Systolic 734 193 790  Diastolic 64 95 68  Pulse 56 59 56    BP 122/64    Pulse (!) 56    Ht 5\' 8"  (1.727 m)    Wt 188 lb 12.8 oz (85.6 kg)    BMI 28.71 kg/m   Wt Readings from Last 3 Encounters:  03/23/21 188 lb 12.8 oz (85.6 kg)  11/17/20 189 lb 6.4 oz (85.9 kg)  09/08/20 194 lb 3.2 oz (88.1 kg)     CMP ( most recent) CMP     Component Value Date/Time   NA 139 03/21/2021 1104   NA 141 11/11/2020 0855   K 4.8 03/21/2021 1104   CL 105 03/21/2021 1104   CO2 25 03/21/2021 1104   GLUCOSE 133 (H) 03/21/2021 1104   BUN 19 03/21/2021 1104   BUN 17 11/11/2020 0855   CREATININE 1.20 03/21/2021 1104   CALCIUM 9.4 03/21/2021 1104   PROT 6.9 11/11/2020 0855   ALBUMIN 4.4 11/11/2020 0855   AST 17 11/11/2020 0855   ALT 13 11/11/2020 0855   ALKPHOS 99 11/11/2020 0855   BILITOT 0.5 11/11/2020 0855   GFRNONAA >60 03/21/2021 1104   GFRAA 69 10/08/2019 1556     Diabetic Labs (most recent): Lab Results  Component Value Date   HGBA1C 6.1 (H) 03/21/2021   HGBA1C 6.4 11/17/2020   HGBA1C 7.0 (H) 08/17/2020     Lipid Panel ( most recent) Lipid Panel     Component Value Date/Time   CHOL 182 11/11/2020 0855   TRIG 142 11/11/2020 0855   HDL 40 11/11/2020 0855   CHOLHDL 4.6 11/11/2020 0855   CHOLHDL 4.4 12/24/2016 0634   VLDL 13 12/24/2016 0634   LDLCALC 117 (H) 11/11/2020 0855   LABVLDL 25 11/11/2020 0855  Assessment & Plan:   1. 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  - AMISH MINTZER has currently uncontrolled symptomatic type 2 DM since  81 years of age.   Mr. Deman presents with continued improvement in his glycemic profile.  He is recent A1c was 6.1%, overall improved from 10.9%.  He  uses a CGM showing 93% time range, 7% slightly above range.  No hypoglycemia.  He would like to consider Jardiance.     Recent labs reviewed. - I had a long discussion with him about the progressive nature of diabetes and the pathology behind its complications. -his diabetes is complicated by coronary artery disease, peripheral arterial disease, and he remains at a high risk for more acute and chronic complications which include CAD, CVA, CKD, retinopathy, and neuropathy. These are all discussed in detail with him.  - I have counseled him on diet  and weight management  by adopting a carbohydrate restricted/protein rich diet. Patient is encouraged to switch to  unprocessed or minimally processed     complex starch and increased protein intake (animal or plant source), fruits, and vegetables. -  he is advised to stick to a routine mealtimes to eat 3 meals  a day and avoid unnecessary snacks ( to snack only to correct hypoglycemia).   - he acknowledges that there is a room for improvement in his food and drink choices. - Suggestion is made for him to avoid simple carbohydrates  from his diet including Cakes, Sweet Desserts, Ice Cream, Soda (diet and regular), Sweet Tea, Candies, Chips, Cookies, Store Bought Juices, Alcohol , Artificial Sweeteners,  Coffee Creamer, and "Sugar-free" Products, Lemonade. This will help patient to have more stable blood glucose profile and potentially avoid unintended weight gain.  The following Lifestyle Medicine recommendations according to [facility]) were discussed and and offered to patient and he  agrees to start the journey:  A. Whole Foods, Plant-Based Nutrition comprising of fruits and vegetables, plant-based proteins, whole-grain carbohydrates was discussed in detail with the patient.   A list for source of those nutrients were also provided to the patient.  Patient will use only water or unsweetened tea for hydration. B.  The  need to stay away from risky substances including alcohol, smoking; obtaining 7 to 9 hours of restorative sleep, at least 150 minutes of moderate intensity exercise weekly, the importance of healthy social connections,  and stress management techniques were discussed.    - he will be scheduled with Jearld Fenton, RDN, CDE for diabetes education.  - I have approached him with the following individualized plan to manage  his diabetes and patient agrees:    -He is requesting a prescription for Jardiance based on recommendation by his [place] providers.  He may benefit from this intervention considering his cardiovascular history.  However he will come off of insulin to avoid inadvertent hypoglycemia.  He is advised to discontinue his low-dose insulin.  I discussed and initiated Jardiance 10 mg p.o. daily at breakfast.  Side effects and precautions discussed with him.  He is also advised to continue metformin 500 mg p.o. once a day, and alogliptin 12.5 mg p.o. once a day.  He is advised to continue to need to use his CGM to monitor blood glucose continuously.   - he is encouraged to call clinic for blood glucose levels less than 70 or above 200 mg /dl.    - Specific targets for  A1c;  LDL, HDL,  and Triglycerides were discussed with the patient.  2) Blood Pressure /Hypertension: His blood pressure is controlled to target.  However, in light of his microalbuminuria, he would benefit from losartan in lieu of amlodipine.  I discussed and prescribed losartan 50 mg p.o. daily along with metoprolol 25 mg p.o. twice daily.  He is advised to discontinue amlodipine.  3) Lipids/Hyperlipidemia: Review of his recent fasting lipid panel reveals uncontrolled LDL at 117, increasing from 68.   he is tolerating low-dose Crestor 5 mg p.o. nightly.  He will have repeat fasting lipid panel before next visit.     4)  Weight/Diet:  Body mass index is 28.71 kg/m.    he is  a candidate for some weight loss. I discussed with  him the fact that loss of 5 - 10% of his  current body weight will have the most impact on his diabetes management.  Exercise, and detailed carbohydrates information provided  -  detailed on discharge instructions.   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  He is referred to vascular surgery for better evaluation and treatment.  6) Chronic Care/Health Maintenance:  -he  Is not  On ARB and not on  Statin medications and  is encouraged to initiate and continue to follow up with Ophthalmology, Dentist,  Podiatrist at least yearly or according to recommendations, and advised to   stay away from smoking. I have recommended yearly flu vaccine and pneumonia vaccine at least every 5 years; moderate intensity exercise for up to 150 minutes weekly; and  sleep for at least 7 hours a day.    - he is  advised to maintain close follow up with Wenda Low, MD for primary care needs, as well as his other providers for optimal and coordinated care.    I spent 42 minutes in the care of the patient today including review of labs from [facility], Lipids, Thyroid Function, Hematology (current and previous including abstractions from other facilities); face-to-face time discussing  his blood glucose readings/logs, discussing hypoglycemia and hyperglycemia episodes and symptoms, medications doses, his options of short and long term treatment based on the latest standards of care / guidelines;  discussion about incorporating lifestyle medicine;  and documenting the encounter.    Please refer to Patient Instructions for Blood Glucose Monitoring and Insulin/Medications Dosing Guide"  in media tab for additional information. Please  also refer to " Patient Self Inventory" in the Media  tab for reviewed elements of pertinent patient history.  Gilda Crease participated in the discussions, expressed understanding, and voiced agreement  with the above plans.  All questions were answered to his satisfaction. he is encouraged to contact clinic should he have any questions or concerns prior to his return visit.  .   Follow up plan: - Return in about 4 months (around 07/21/2021) for Bring Meter and Logs- A1c in Office.  Glade Lloyd, MD [facility] [address] [place], [place] [address] Phone: [phone]  Fax: [phone]    03/23/2021, 3:04 PM  This note was partially dictated with voice recognition software. Similar sounding words can be transcribed inadequately or may not  be corrected upon review.

## 2021-03-23 NOTE — Patient Instructions (Signed)

## 2021-03-31 ENCOUNTER — Other Ambulatory Visit: Payer: Self-pay

## 2021-03-31 ENCOUNTER — Ambulatory Visit (HOSPITAL_COMMUNITY)
Admission: RE | Admit: 2021-03-31 | Discharge: 2021-03-31 | Disposition: A | Discharge status: Home / Self Care | Payer: Medicare Other | Source: Ambulatory Visit | Attending: Orthopedic Surgery | Admitting: Orthopedic Surgery

## 2021-03-31 ENCOUNTER — Ambulatory Visit (HOSPITAL_BASED_OUTPATIENT_CLINIC_OR_DEPARTMENT_OTHER): Payer: Medicare Other | Admitting: Anesthesiology

## 2021-03-31 ENCOUNTER — Encounter (HOSPITAL_COMMUNITY)
Admission: RE | Disposition: A | Discharge status: Home / Self Care | Payer: Self-pay | Source: Ambulatory Visit | Attending: Orthopedic Surgery

## 2021-03-31 ENCOUNTER — Encounter (HOSPITAL_COMMUNITY): Payer: Self-pay | Admitting: Orthopedic Surgery

## 2021-03-31 ENCOUNTER — Ambulatory Visit (HOSPITAL_COMMUNITY): Payer: Medicare Other | Admitting: Physician Assistant

## 2021-03-31 DIAGNOSIS — K219 Gastro-esophageal reflux disease without esophagitis: Secondary | ICD-10-CM | POA: Insufficient documentation

## 2021-03-31 DIAGNOSIS — G8918 Other acute postprocedural pain: Secondary | ICD-10-CM | POA: Diagnosis not present

## 2021-03-31 DIAGNOSIS — I129 Hypertensive chronic kidney disease with stage 1 through stage 4 chronic kidney disease, or unspecified chronic kidney disease: Secondary | ICD-10-CM | POA: Insufficient documentation

## 2021-03-31 DIAGNOSIS — N183 Chronic kidney disease, stage 3 unspecified: Secondary | ICD-10-CM | POA: Insufficient documentation

## 2021-03-31 DIAGNOSIS — M19011 Primary osteoarthritis, right shoulder: Secondary | ICD-10-CM

## 2021-03-31 DIAGNOSIS — Z955 Presence of coronary angioplasty implant and graft: Secondary | ICD-10-CM | POA: Insufficient documentation

## 2021-03-31 DIAGNOSIS — M75101 Unspecified rotator cuff tear or rupture of right shoulder, not specified as traumatic: Secondary | ICD-10-CM

## 2021-03-31 DIAGNOSIS — I2510 Atherosclerotic heart disease of native coronary artery without angina pectoris: Secondary | ICD-10-CM

## 2021-03-31 DIAGNOSIS — I252 Old myocardial infarction: Secondary | ICD-10-CM | POA: Insufficient documentation

## 2021-03-31 DIAGNOSIS — E1122 Type 2 diabetes mellitus with diabetic chronic kidney disease: Secondary | ICD-10-CM | POA: Insufficient documentation

## 2021-03-31 DIAGNOSIS — M6281 Muscle weakness (generalized): Secondary | ICD-10-CM | POA: Diagnosis not present

## 2021-03-31 DIAGNOSIS — E119 Type 2 diabetes mellitus without complications: Secondary | ICD-10-CM | POA: Diagnosis not present

## 2021-03-31 DIAGNOSIS — Z7984 Long term (current) use of oral hypoglycemic drugs: Secondary | ICD-10-CM | POA: Diagnosis not present

## 2021-03-31 HISTORY — PX: REVERSE SHOULDER ARTHROPLASTY: SHX5054

## 2021-03-31 LAB — GLUCOSE, CAPILLARY
Glucose-Capillary: 98 mg/dL (ref 70–99)
Glucose-Capillary: 127 mg/dL — ABNORMAL HIGH (ref 70–99)

## 2021-03-31 SURGERY — ARTHROPLASTY, SHOULDER, TOTAL, REVERSE
Anesthesia: General | Site: Shoulder | Laterality: Right

## 2021-03-31 MED ORDER — PHENYLEPHRINE HCL (PRESSORS) 10 MG/ML IV SOLN
INTRAVENOUS | Status: AC
  Filled 2021-03-31: qty 1

## 2021-03-31 MED ORDER — KETOROLAC TROMETHAMINE 30 MG/ML IJ SOLN: 30.0000 mg | Freq: Once | INTRAMUSCULAR | Status: DC | PRN

## 2021-03-31 MED ORDER — ACETAMINOPHEN 160 MG/5ML PO SOLN: 325.0000 mg | ORAL | Status: DC | PRN

## 2021-03-31 MED ORDER — FENTANYL CITRATE (PF) 100 MCG/2ML IJ SOLN
INTRAMUSCULAR | Status: AC
  Filled 2021-03-31: qty 2

## 2021-03-31 MED ORDER — VANCOMYCIN HCL 1000 MG IV SOLR
INTRAVENOUS | Status: AC
  Filled 2021-03-31: qty 20

## 2021-03-31 MED ORDER — TRANEXAMIC ACID-NACL 1000-0.7 MG/100ML-% IV SOLN
1000.0000 mg | INTRAVENOUS | Status: AC
  Administered 2021-03-31: 1000 mg via INTRAVENOUS
  Filled 2021-03-31: qty 100

## 2021-03-31 MED ORDER — ONDANSETRON HCL 4 MG/2ML IJ SOLN
INTRAMUSCULAR | Status: DC | PRN
  Administered 2021-03-31: 4 mg via INTRAVENOUS

## 2021-03-31 MED ORDER — MEPERIDINE HCL 50 MG/ML IJ SOLN: 6.2500 mg | INTRAMUSCULAR | Status: DC | PRN

## 2021-03-31 MED ORDER — FENTANYL CITRATE (PF) 100 MCG/2ML IJ SOLN
INTRAMUSCULAR | Status: DC | PRN
  Administered 2021-03-31 (×3): 25 ug via INTRAVENOUS

## 2021-03-31 MED ORDER — MIDAZOLAM HCL 5 MG/5ML IJ SOLN
INTRAMUSCULAR | Status: DC | PRN
  Administered 2021-03-31: 1 mg via INTRAVENOUS

## 2021-03-31 MED ORDER — DEXAMETHASONE SODIUM PHOSPHATE 10 MG/ML IJ SOLN
INTRAMUSCULAR | Status: DC | PRN
  Administered 2021-03-31: 10 mg via INTRAVENOUS

## 2021-03-31 MED ORDER — PROPOFOL 10 MG/ML IV BOLUS
INTRAVENOUS | Status: DC | PRN
  Administered 2021-03-31: 150 mg via INTRAVENOUS

## 2021-03-31 MED ORDER — 0.9 % SODIUM CHLORIDE (POUR BTL) OPTIME
TOPICAL | Status: DC | PRN
Start: 2021-03-31 — End: 2021-03-31
  Administered 2021-03-31: 1000 mL

## 2021-03-31 MED ORDER — ONDANSETRON HCL 4 MG PO TABS: 4.0000 mg | ORAL_TABLET | Freq: Three times a day (TID) | ORAL | 0 refills | Status: AC | PRN

## 2021-03-31 MED ORDER — MIDAZOLAM HCL 2 MG/2ML IJ SOLN
INTRAMUSCULAR | Status: AC
  Filled 2021-03-31: qty 2

## 2021-03-31 MED ORDER — GLYCOPYRROLATE 0.2 MG/ML IJ SOLN
INTRAMUSCULAR | Status: AC
  Filled 2021-03-31: qty 1

## 2021-03-31 MED ORDER — ACETAMINOPHEN 325 MG PO TABS: 325.0000 mg | ORAL_TABLET | ORAL | Status: DC | PRN

## 2021-03-31 MED ORDER — SUCCINYLCHOLINE CHLORIDE 200 MG/10ML IV SOSY
PREFILLED_SYRINGE | INTRAVENOUS | Status: AC
  Filled 2021-03-31: qty 10

## 2021-03-31 MED ORDER — ONDANSETRON HCL 4 MG/2ML IJ SOLN: 4.0000 mg | Freq: Once | INTRAMUSCULAR | Status: DC | PRN

## 2021-03-31 MED ORDER — EPHEDRINE SULFATE (PRESSORS) 50 MG/ML IJ SOLN
INTRAMUSCULAR | Status: DC | PRN
  Administered 2021-03-31: 10 mg via INTRAVENOUS
  Administered 2021-03-31: 5 mg via INTRAVENOUS
  Administered 2021-03-31: 15 mg via INTRAVENOUS
  Administered 2021-03-31 (×2): 5 mg via INTRAVENOUS
  Administered 2021-03-31: 10 mg via INTRAVENOUS

## 2021-03-31 MED ORDER — HYDROMORPHONE HCL 2 MG PO TABS: 2.0000 mg | ORAL_TABLET | ORAL | 0 refills | Status: DC | PRN

## 2021-03-31 MED ORDER — ROCURONIUM BROMIDE 10 MG/ML (PF) SYRINGE
PREFILLED_SYRINGE | INTRAVENOUS | Status: AC
  Filled 2021-03-31: qty 10

## 2021-03-31 MED ORDER — ORAL CARE MOUTH RINSE: 15.0000 mL | Freq: Once | OROMUCOSAL | Status: AC

## 2021-03-31 MED ORDER — BUPIVACAINE-EPINEPHRINE (PF) 0.5% -1:200000 IJ SOLN
INTRAMUSCULAR | Status: DC | PRN
  Administered 2021-03-31 (×5): 3 mL via PERINEURAL

## 2021-03-31 MED ORDER — FENTANYL CITRATE PF 50 MCG/ML IJ SOSY: 25.0000 ug | PREFILLED_SYRINGE | INTRAMUSCULAR | Status: DC | PRN

## 2021-03-31 MED ORDER — LACTATED RINGERS IV BOLUS
250.0000 mL | Freq: Once | INTRAVENOUS | Status: AC
  Administered 2021-03-31: 250 mL via INTRAVENOUS

## 2021-03-31 MED ORDER — LIDOCAINE HCL (CARDIAC) PF 100 MG/5ML IV SOSY
PREFILLED_SYRINGE | INTRAVENOUS | Status: DC | PRN
  Administered 2021-03-31: 100 mg via INTRAVENOUS

## 2021-03-31 MED ORDER — LACTATED RINGERS IV SOLN: INTRAVENOUS | Status: DC

## 2021-03-31 MED ORDER — CEFAZOLIN SODIUM-DEXTROSE 2-4 GM/100ML-% IV SOLN
2.0000 g | INTRAVENOUS | Status: AC
  Administered 2021-03-31: 2 g via INTRAVENOUS
  Filled 2021-03-31: qty 100

## 2021-03-31 MED ORDER — LIDOCAINE HCL (PF) 2 % IJ SOLN
INTRAMUSCULAR | Status: AC
  Filled 2021-03-31: qty 5

## 2021-03-31 MED ORDER — ROCURONIUM BROMIDE 100 MG/10ML IV SOLN
INTRAVENOUS | Status: DC | PRN
  Administered 2021-03-31: 30 mg via INTRAVENOUS
  Administered 2021-03-31: 10 mg via INTRAVENOUS

## 2021-03-31 MED ORDER — CHLORHEXIDINE GLUCONATE 0.12 % MT SOLN
15.0000 mL | Freq: Once | OROMUCOSAL | Status: AC
  Administered 2021-03-31: 15 mL via OROMUCOSAL

## 2021-03-31 MED ORDER — CYCLOBENZAPRINE HCL 10 MG PO TABS: 10.0000 mg | ORAL_TABLET | Freq: Three times a day (TID) | ORAL | 1 refills | Status: DC | PRN

## 2021-03-31 MED ORDER — GLYCOPYRROLATE 0.2 MG/ML IJ SOLN
INTRAMUSCULAR | Status: DC | PRN
  Administered 2021-03-31: .2 mg via INTRAVENOUS

## 2021-03-31 MED ORDER — TRANEXAMIC ACID 1000 MG/10ML IV SOLN: 1000.0000 mg | INTRAVENOUS | Status: DC

## 2021-03-31 MED ORDER — ONDANSETRON HCL 4 MG/2ML IJ SOLN
INTRAMUSCULAR | Status: AC
  Filled 2021-03-31: qty 2

## 2021-03-31 MED ORDER — SUCCINYLCHOLINE CHLORIDE 200 MG/10ML IV SOSY
PREFILLED_SYRINGE | INTRAVENOUS | Status: DC | PRN
  Administered 2021-03-31: 130 mg via INTRAVENOUS

## 2021-03-31 MED ORDER — STERILE WATER FOR IRRIGATION IR SOLN
Status: DC | PRN
  Administered 2021-03-31: 2000 mL

## 2021-03-31 MED ORDER — SUGAMMADEX SODIUM 200 MG/2ML IV SOLN
INTRAVENOUS | Status: DC | PRN
Start: 2021-03-31 — End: 2021-03-31
  Administered 2021-03-31: 200 mg via INTRAVENOUS

## 2021-03-31 MED ORDER — PHENYLEPHRINE HCL (PRESSORS) 10 MG/ML IV SOLN
INTRAVENOUS | Status: DC | PRN
  Administered 2021-03-31 (×2): 40 ug via INTRAVENOUS
  Administered 2021-03-31: 80 ug via INTRAVENOUS
  Administered 2021-03-31: 40 ug via INTRAVENOUS

## 2021-03-31 MED ORDER — PROPOFOL 10 MG/ML IV BOLUS
INTRAVENOUS | Status: AC
  Filled 2021-03-31: qty 20

## 2021-03-31 MED ORDER — EPHEDRINE 5 MG/ML INJ
INTRAVENOUS | Status: AC
  Filled 2021-03-31: qty 10

## 2021-03-31 MED ORDER — BUPIVACAINE LIPOSOME 1.3 % IJ SUSP
INTRAMUSCULAR | Status: DC | PRN
  Administered 2021-03-31 (×5): 2 mL via PERINEURAL

## 2021-03-31 MED ORDER — DEXAMETHASONE SODIUM PHOSPHATE 10 MG/ML IJ SOLN
INTRAMUSCULAR | Status: AC
  Filled 2021-03-31: qty 1

## 2021-03-31 MED ORDER — PHENYLEPHRINE 40 MCG/ML (10ML) SYRINGE FOR IV PUSH (FOR BLOOD PRESSURE SUPPORT)
PREFILLED_SYRINGE | INTRAVENOUS | Status: AC
  Filled 2021-03-31: qty 10

## 2021-03-31 MED ORDER — VANCOMYCIN HCL 1000 MG IV SOLR
INTRAVENOUS | Status: DC | PRN
  Administered 2021-03-31: 1000 mg

## 2021-03-31 MED ORDER — LACTATED RINGERS IV BOLUS
500.0000 mL | Freq: Once | INTRAVENOUS | Status: AC
  Administered 2021-03-31: 500 mL via INTRAVENOUS

## 2021-03-31 SURGICAL SUPPLY — 74 items
BAG COUNTER SPONGE SURGICOUNT (BAG) ×1 IMPLANT
BAG ZIPLOCK 12X15 (MISCELLANEOUS) ×2 IMPLANT
BLADE SAW SGTL 83.5X18.5 (BLADE) ×2 IMPLANT
BNDG COHESIVE 4X5 TAN ST LF (GAUZE/BANDAGES/DRESSINGS) ×2 IMPLANT
COOLER ICEMAN CLASSIC (MISCELLANEOUS) ×2 IMPLANT
COVER BACK TABLE 60X90IN (DRAPES) ×2 IMPLANT
COVER SURGICAL LIGHT HANDLE (MISCELLANEOUS) ×2 IMPLANT
CUP SUT UNIV REVERS 39 NEU (Shoulder) ×1 IMPLANT
DERMABOND ADVANCED (GAUZE/BANDAGES/DRESSINGS) ×1
DERMABOND ADVANCED .7 DNX12 (GAUZE/BANDAGES/DRESSINGS) ×1 IMPLANT
DRAPE INCISE IOBAN 66X45 STRL (DRAPES) IMPLANT
DRAPE ORTHO SPLIT 77X108 STRL (DRAPES) ×4
DRAPE SHEET LG 3/4 BI-LAMINATE (DRAPES) ×2 IMPLANT
DRAPE SURG 17X11 SM STRL (DRAPES) ×2 IMPLANT
DRAPE SURG ORHT 6 SPLT 77X108 (DRAPES) ×2 IMPLANT
DRAPE TOP 10253 STERILE (DRAPES) ×2 IMPLANT
DRAPE U-SHAPE 47X51 STRL (DRAPES) ×2 IMPLANT
DRESSING AQUACEL AG SP 3.5X6 (GAUZE/BANDAGES/DRESSINGS) ×1 IMPLANT
DRSG AQUACEL AG ADV 3.5X 6 (GAUZE/BANDAGES/DRESSINGS) ×1 IMPLANT
DRSG AQUACEL AG ADV 3.5X10 (GAUZE/BANDAGES/DRESSINGS) IMPLANT
DRSG AQUACEL AG SP 3.5X6 (GAUZE/BANDAGES/DRESSINGS) ×2
DRSG TEGADERM 8X12 (GAUZE/BANDAGES/DRESSINGS) ×2 IMPLANT
DURAPREP 26ML APPLICATOR (WOUND CARE) ×2 IMPLANT
ELECT BLADE TIP CTD 4 INCH (ELECTRODE) ×2 IMPLANT
ELECT PENCIL ROCKER SW 15FT (MISCELLANEOUS) ×2 IMPLANT
ELECT REM PT RETURN 15FT ADLT (MISCELLANEOUS) ×2 IMPLANT
FACESHIELD WRAPAROUND (MASK) ×8 IMPLANT
FACESHIELD WRAPAROUND OR TEAM (MASK) ×4 IMPLANT
GLENOID UNI REV MOD 24 +2 LAT (Joint) ×1 IMPLANT
GLENOSPHERE 39+4 LAT/24 UNI RV (Joint) ×1 IMPLANT
GLOVE SRG 8 PF TXTR STRL LF DI (GLOVE) ×1 IMPLANT
GLOVE SURG ENC MOIS LTX SZ7 (GLOVE) ×2 IMPLANT
GLOVE SURG ENC MOIS LTX SZ7.5 (GLOVE) ×2 IMPLANT
GLOVE SURG UNDER POLY LF SZ7 (GLOVE) ×2 IMPLANT
GLOVE SURG UNDER POLY LF SZ8 (GLOVE) ×1
GOWN STRL REUS W/TWL LRG LVL3 (GOWN DISPOSABLE) ×4 IMPLANT
INSERT HUMERAL MED 39/ +3 (Shoulder) IMPLANT
INSERT MEDIUM HUMERAL 39/ +3 (Shoulder) ×1 IMPLANT
KIT BASIN OR (CUSTOM PROCEDURE TRAY) ×2 IMPLANT
KIT TURNOVER KIT A (KITS) ×1 IMPLANT
MANIFOLD NEPTUNE II (INSTRUMENTS) ×2 IMPLANT
NDL TAPERED W/ NITINOL LOOP (MISCELLANEOUS) ×1 IMPLANT
NEEDLE TAPERED W/ NITINOL LOOP (MISCELLANEOUS) ×2 IMPLANT
NS IRRIG 1000ML POUR BTL (IV SOLUTION) ×2 IMPLANT
PACK SHOULDER (CUSTOM PROCEDURE TRAY) ×2 IMPLANT
PAD ARMBOARD 7.5X6 YLW CONV (MISCELLANEOUS) ×2 IMPLANT
PAD COLD SHLDR WRAP-ON (PAD) ×2 IMPLANT
PIN NITINOL TARGETER 2.8 (PIN) IMPLANT
PIN SET MODULAR GLENOID SYSTEM (PIN) ×1 IMPLANT
RESTRAINT HEAD UNIVERSAL NS (MISCELLANEOUS) ×2 IMPLANT
SCREW CENTRAL MOD 30MM (Screw) ×1 IMPLANT
SCREW PERI LOCK 5.5X16 (Screw) ×2 IMPLANT
SCREW PERI LOCK 5.5X32 (Screw) ×1 IMPLANT
SCREW PERIPHERAL 5.5X28 LOCK (Screw) ×1 IMPLANT
SLING ARM FOAM STRAP LRG (SOFTGOODS) ×1 IMPLANT
SLING ARM FOAM STRAP MED (SOFTGOODS) IMPLANT
SPACER SHLD UNI REV 39 +6 (Shoulder) ×1 IMPLANT
SPONGE T-LAP 18X18 ~~LOC~~+RFID (SPONGE) ×2 IMPLANT
SPONGE T-LAP 4X18 ~~LOC~~+RFID (SPONGE) ×2 IMPLANT
STEM HUMERAL UNI REVERS SZ9 (Stem) ×1 IMPLANT
SUCTION FRAZIER HANDLE 12FR (TUBING) ×1
SUCTION TUBE FRAZIER 12FR DISP (TUBING) ×1 IMPLANT
SUT FIBERWIRE #2 38 T-5 BLUE (SUTURE) ×4
SUT MNCRL AB 3-0 PS2 18 (SUTURE) ×2 IMPLANT
SUT MON AB 2-0 CT1 36 (SUTURE) ×2 IMPLANT
SUT VIC AB 1 CT1 36 (SUTURE) ×2 IMPLANT
SUTURE FIBERWR #2 38 T-5 BLUE (SUTURE) IMPLANT
SUTURE TAPE 1.3 40 TPR END (SUTURE) ×2 IMPLANT
SUTURETAPE 1.3 40 TPR END (SUTURE) ×4
TOWEL OR 17X26 10 PK STRL BLUE (TOWEL DISPOSABLE) ×2 IMPLANT
TOWEL OR NON WOVEN STRL DISP B (DISPOSABLE) ×2 IMPLANT
TUBE SUCTION HIGH CAP CLEAR NV (SUCTIONS) ×2 IMPLANT
WATER STERILE IRR 1000ML POUR (IV SOLUTION) ×4 IMPLANT
YANKAUER SUCT BULB TIP 10FT TU (MISCELLANEOUS) ×2 IMPLANT

## 2021-03-31 NOTE — Discharge Instructions (Signed)

## 2021-03-31 NOTE — Op Note (Signed)
03/31/2021  9:56 AM  PATIENT:   Matthew [name]  81 y.o. 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  ASSISTANTS: Jenetta Loges, PA-C  ANESTHESIA:   General endotracheal and interscalene block with Exparel  EBL: 150 cc  SPECIMEN: None  Drains: None   PATIENT DISPOSITION:  PACU - hemodynamically stable.    PLAN OF CARE: Discharge to home after PACU  Brief history:  Patient is a 81 year old gentleman with chronic and progressively increasing right shoulder pain related to severe osteoarthritis with marked bony deformity and associated rotator cuff dysfunction.  Due to his increasing pain and functional limitations and failure to respond to prolonged attempts at conservative management, he is brought to the operating room at this time for planned right shoulder reverse arthroplasty  Preoperatively, I counseled the patient regarding treatment options and risks versus benefits thereof.  Possible surgical complications were all reviewed including potential for bleeding, infection, neurovascular injury, persistent pain, loss of motion, anesthetic complication, failure of the implant, and possible need for additional surgery. They understand and accept and agrees with our planned procedure.   Procedure in detail:  After undergoing routine preop evaluation the patient received prophylactic antibiotics and interscalene block with Exparel was established in the holding area by the anesthesia department.  Patient subsequently placed spine on the operating table and underwent the smooth induction of a general endotracheal anesthesia.  Placed into the beachchair position and  appropriately padded and protected.  The right shoulder girdle region was sterilely prepped and draped in standard fashion.  Timeout was called.  A deltopectoral approach to the right shoulder was made through an approximate 8 cm incision.  Skin flaps were elevated dissection carried deeply the deltopectoral interval was then developed from proximal to distal with the vein taken laterally.  The long head biceps tendon was then tenodesed at the upper border the pectoralis major tendon and the proximal segment was then unroofed and excised.  The rotator cuff superiorly was split along the rotator interval from the apex of the bicipital groove to the base of the coracoid and the subscap was then separated from the lesser tuberosity using a peel technique and was tagged with a pair of suture tape sutures.  Capsular attachments were then divided from the anterior and inferior margins of the humeral neck and the humeral head was then delivered through the wound.  An extra medullary guide was then used to outline a proposed humeral head resection which we then performed with an oscillating saw at approximately 20 degrees of retroversion.  Peripheral osteophytes were then removed with a rondure.  A metal cap was then placed over the cut proximal humeral surface and we then exposed the glenoid with appropriate retractors.  A circumferential labral resection was performed gaining complete visualization the periphery of the glenoid.  Guidepin was then directed into the center of the glenoid with an approximately 10 degree inferior tilt and the glenoid was then reamed with a central followed by peripheral reamer to a stable subchondral bony bed.  Preparation completed with the central drill and tapped for a 30 mm lag screw.  Baseplate was then assembled and was inserted with vancomycin powder applied to the threads of the lag screw and excellent purchase  and fixation was achieved.  All of the peripheral locking screws were then  placed using standard technique and getting excellent purchase and fixation.  A 39/+4 glenosphere was then impacted onto the baseplate and the central locking screw was placed.  We then returned our attention back to the proximal humerus where the canal was opened with broaching up to a size 9 implant at approximate 20 degrees of retroversion.  A neutral reaming guide was then used to prepare the metaphysis.  A trial implant was then placed showing excellent fit and fixation with good soft tissue balance.  At this point the trial was then removed.  Final implant was assembled.  Canal was cleaned and dried and vancomycin powder was then sprayed into the humeral canal.  The implant was then terminally seated with excellent fit and fixation.  We then performed a series of trial reductions and ultimately felt that the +9 off the implant gave [place] the best motion stability and soft tissue balance.  At this point the trials were then removed.  A +6 spacer was applied to the implant and a +3 poly was then placed and final reduction performed again showing excellent motion good stability good soft tissue balance.  The wound was then copiously irrigated.  Final hemostasis was obtained.  The subscapularis was confirmed to have good elasticity and was then repaired back to the eyelets on the collar the implant using the previously placed suture tape sutures.  The arm easily achieved 45 degrees of external Tat[place] without excessive tension on the subscap repair.  The wound was cleaned and dried.  Vancomycin powder was then spread liberally throughout the deep soft tissue layers.  The deltopectoral interval was reapproximated with a series of figure-of-eight number Vicryl sutures.  2-0 Monocryl used to close the subcu layer and intracuticular 3-0 Monocryl used to close the skin followed by Dermabond and Aquacel dressing.  Bladder and placed into a sling and the patient was awakened, extubated, and taken to the recovery in stable  condition.  Jenetta Loges, PA-C was utilized as an Environmental consultant throughout this case, essential for help with positioning the patient, positioning extremity, tissue manipulation, implantation of the prosthesis, suture management, wound closure, and intraoperative decision-making.  Marin Shutter MD   Contact # [phone]

## 2021-03-31 NOTE — Evaluation (Signed)
Occupational Therapy Evaluation Patient Details Name: Matthew [name] MRN: [id number] DOB: 26-Apr-1940 Today's Date: 03/31/2021   History of Present Illness Patient s/p r reverse TSA   Clinical Impression   [name] is a 81 year old man s/p shoulder replacement without functional use of right dominant upper extremity secondary to effects of surgery and interscalene block and shoulder precautions. Therapist provided education and instruction to patient and spouse in regards to exercises, precautions, positioning, donning upper extremity clothing and bathing while maintaining shoulder precautions, ice and edema management and donning/doffing sling. Patient and spouse verbalized understanding and demonstrated as needed. Handouts provided to maximize retention of education. Patient needed assistance to donn shirt, underwear, pants, socks and shoes and provided with instruction on compensatory strategies to perform ADLs. Patient to follow up with MD for further therapy needs.        Recommendations for follow up therapy are one component of a multi-disciplinary discharge planning process, led by the attending physician.  Recommendations may be updated based on patient status, additional functional criteria and insurance authorization.   Follow Up Recommendations  Follow physician's recommendations for discharge plan and follow up therapies    Assistance Recommended at Discharge Intermittent Supervision/Assistance  Patient can return home with the following A little help with bathing/dressing/bathroom;A little help with walking and/or transfers;Assistance with cooking/housework    Functional Status Assessment  Patient has had a recent decline in their functional status and demonstrates the ability to make significant improvements in function in a reasonable and predictable amount of time.  Equipment Recommendations  None recommended by OT    Recommendations for Other Services        Precautions / Restrictions Precautions Precautions: Shoulder Type of Shoulder Precautions: If sitting in controlled environment, ok to come out of sling to give neck a break. Please sleep in it to protect until follow up in office.     OK to use operative arm for feeding, hygiene and ADLs.   Ok to instruct Pendulums and lap slides as exercises. Ok to use operative arm within the following parameters for ADL purposes     New ROM (8/18)   Ok for PROM, AAROM, AROM within pain tolerance and within the following ROM   ER 20   ABD 45   FE 60 Shoulder Interventions: Shoulder sling/immobilizer;Off for dressing/bathing/exercises Precaution Booklet Issued:  (handouts) Restrictions Weight Bearing Restrictions: Yes RUE Weight Bearing: Non weight bearing      Mobility Bed Mobility                    Transfers Overall transfer level: Needs assistance                 General transfer comment: Min assist to rise from recliner and min guard to steady in standing      Balance Overall balance assessment: Mild deficits observed, not formally tested (reports decreased balance at baseline)                                         ADL either performed or assessed with clinical judgement   ADL                                               Vision  Patient Visual Report: No change from baseline       Perception     Praxis      Pertinent Vitals/Pain Pain Assessment Pain Assessment: No/denies pain     Hand Dominance Right   Extremity/Trunk Assessment Upper Extremity Assessment Upper Extremity Assessment: RUE deficits/detail RUE Deficits / Details: impaired active ROm secondary to interscalene block RUE Sensation: decreased light touch   Lower Extremity Assessment Lower Extremity Assessment: Overall WFL for tasks assessed   Cervical / Trunk Assessment Cervical / Trunk Assessment: Normal   Communication Communication Communication: No  difficulties   Cognition Arousal/Alertness: Awake/alert Behavior During Therapy: WFL for tasks assessed/performed Overall Cognitive Status: Within Functional Limits for tasks assessed                                       General Comments       Exercises     Shoulder Instructions Shoulder Instructions Donning/doffing shirt without moving shoulder: Caregiver independent with task Method for sponge bathing under operated UE: Caregiver independent with task Donning/doffing sling/immobilizer: Caregiver independent with task Correct positioning of sling/immobilizer: Patient able to independently direct caregiver Pendulum exercises (written home exercise program): Patient able to independently direct caregiver;Caregiver independent with task ROM for elbow, wrist and digits of operated UE: Independent Sling wearing schedule (on at all times/off for ADL's): Independent Proper positioning of operated UE when showering: Independent Dressing change: Independent Positioning of UE while sleeping: [place] expects to be discharged to:: Private residence Living Arrangements: Spouse/significant other Available Help at Discharge: Family;Available 24 hours/day Type of Home: House Home Access: Stairs to enter [employer] of Steps: 2 Entrance Stairs-Rails: None Home Layout: One level     Bathroom Shower/Tub: Occupational psychologist: Handicapped height Bathroom Accessibility: Yes   Home Equipment: Conservation officer, nature (2 wheels);Cane - single point          Prior Functioning/Environment Prior Level of Function : Independent/Modified Independent                        OT Problem List: Decreased strength;Decreased range of motion;Impaired UE functional use;Pain      OT Treatment/Interventions:      OT Goals(Current goals can be found in the care plan section) Acute Rehab OT Goals OT Goal Formulation: All  assessment and education complete, DC therapy  OT Frequency:      Co-evaluation              AM-PAC OT "6 Clicks" Daily Activity     Outcome Measure Help from another person eating meals?: A Little Help from another person taking care of personal grooming?: A Little Help from another person toileting, which includes using toliet, bedpan, or urinal?: A Little Help from another person bathing (including washing, rinsing, drying)?: A Little Help from another person to put on and taking off regular upper body clothing?: A Little Help from another person to put on and taking off regular lower body clothing?: A Little 6 Click Score: 18   End of Session Nurse Communication:  (OT education complete)  Activity Tolerance: Patient tolerated treatment well Patient left: in chair;with family/visitor present  OT Visit Diagnosis: Pain;Muscle weakness (generalized) (M62.81)                Time: 2683-4196 OT Time Calculation (min): 20 min Charges:  OT General Charges $  OT Visit: 1 Visit OT Evaluation $OT Eval Low Complexity: 1 Low  Mishawn Hemann, OTR/L [place]  Office [phone] Pager: [place] 03/31/2021, 11:52 AM

## 2021-03-31 NOTE — Anesthesia Procedure Notes (Signed)
Procedure Name: Intubation Date/Time: 03/31/2021 7:46 AM Performed by: Aline Brochure, CRNA Pre-anesthesia Checklist: Patient identified, Patient being monitored, Timeout performed, Emergency Drugs available and Suction available Patient Re-evaluated:Patient Re-evaluated prior to induction Oxygen Delivery Method: Circle system utilized Preoxygenation: Pre-oxygenation with 100% oxygen Induction Type: IV induction Ventilation: Mask ventilation without difficulty Laryngoscope Size: Mac and 4 Grade View: Grade I Tube type: Oral Tube size: 7.5 mm Number of attempts: 1 Airway Equipment and Method: Stylet Placement Confirmation: ETT inserted through vocal cords under direct vision, positive ETCO2 and breath sounds checked- equal and bilateral Secured at: 23 cm Tube secured with: Tape Dental Injury: Teeth and Oropharynx as per pre-operative assessment

## 2021-03-31 NOTE — Transfer of Care (Signed)
Immediate Anesthesia Transfer of Care Note  Patient: Matthew [name]  Procedure(s) Performed: REVERSE SHOULDER ARTHROPLASTY (Right: Shoulder)  Patient Location: PACU  Anesthesia Type:General  Level of Consciousness: awake, alert  and oriented  Airway & Oxygen Therapy: Patient Spontanous Breathing and Patient connected to face mask oxygen  Post-op Assessment: Report given to RN and Post -op Vital signs reviewed and stable  Post vital signs: Reviewed and stable  Last Vitals:  Vitals Value Taken Time  BP 139/88 03/31/21 0942  Temp    Pulse 63 03/31/21 0944  Resp 13 03/31/21 0944  SpO2 96 % 03/31/21 0944  Vitals shown include unvalidated device data.  Last Pain:  Vitals:   03/31/21 0545  TempSrc: Oral  PainSc:       Patients Stated Pain Goal: 4 (09/81/19 1478)  Complications: No notable events documented.

## 2021-03-31 NOTE — H&P (Signed)
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 male with chronic and progressively increasing right shoulder pain related to severe rotator cuff tear arthropathy.  Due to his increasing functional limitations and failure to respond to a prolonged attempts at conservative management, he is brought to the operating room at this time for planned right shoulder reverse arthroplasty  Past Medical History:  Diagnosis Date   Anticoagulated    plavix   Arthritis    Shoulder, knees, back    CAD in native artery cardiologist-  dr Angelena Form   a. CAD/NSTEMI ,  cardiac cath staged stenting-- 12-25-2016  PTCA and DES x3 to prox. and mid LAD;  12-26-2016  PCI to PLA and DES x1 to midRCA,  EF 60-65%.   Chronic lower back pain    CKD (chronic kidney disease), stage III (HCC)    Complication of anesthesia    "Too much with shoulder surgery", pt. reports that he was told the at they "lost him, due to absorbing too much anesthesia".  shoulder surgery 1985   DDD (degenerative disc disease), lumbosacral    GERD (gastroesophageal reflux disease)    History of gastric ulcer 01/2017   History of kidney stones    History of malignant melanoma    right side of nose   History of non-ST elevation myocardial infarction (NSTEMI) 12/23/2017   s/p  staged cardiac cath,  s/p  PCI and DEStenting   Hyperlipidemia    Hypertension    IDA (iron deficiency anemia)    Myocardial infarction ([place])    x 2   Neuromuscular disorder (HCC)    neuropathy   Nocturia    RBBB    Renal calculus, right    S/P drug eluting coronary stent placement 12-25-2017,  12-26-2017   PTCA and DES x3 to prox. and mid LAD;  PCI to PLA and DES x1 to midRCA   Type 2 diabetes mellitus treated with insulin ([place])    followed by pcp    Past Surgical History:  Procedure Laterality Date   ANAL FISSURE REPAIR  X 2   BACK SURGERY     CATARACT EXTRACTION W/  INTRAOCULAR LENS  IMPLANT, BILATERAL Bilateral 2017;  2015   COLONOSCOPY WITH PROPOFOL N/A 06/05/2016   Procedure: COLONOSCOPY WITH PROPOFOL;  Surgeon: Garlan Fair, MD;  Location: [facility];  Service: Endoscopy;  Laterality: N/A;   CONVERSION TO TOTAL KNEE Right 08/25/2020   Procedure: Revision right knee unicompartmental arthroplasty to total knee arthroplasty;  Surgeon: Gaynelle Arabian, MD;  Location: [facility];  Service: Orthopedics;  Laterality: Right;   CORONARY ANGIOGRAPHY N/A 12/26/2016   Procedure: CORONARY ANGIOGRAPHY;  Surgeon: Troy Sine, MD;  Location: [place] [facility];  Service: Cardiovascular;  Laterality: N/A;   CORONARY STENT INTERVENTION N/A 12/25/2016   Procedure: CORONARY STENT INTERVENTION;  Surgeon: Burnell Blanks, MD;  Location: [place] [facility];  Service: Cardiovascular;  Laterality: N/A;   CORONARY STENT INTERVENTION N/A 12/26/2016   Procedure: CORONARY STENT INTERVENTION;  Surgeon: Troy Sine, MD;  Location: [place] [facility];  Service: Cardiovascular;  Laterality: N/A;   CYSTOSCOPY/URETEROSCOPY/HOLMIUM LASER/STENT PLACEMENT Right 01/07/2018   Procedure: RIGHT URETEROSCOPY/HOLMIUM LASER/STENT PLACEMENT;  Surgeon: Lucas Mallow, MD;  Location: [facility];  Service: Urology;  Laterality: Right;   ESOPHAGOGASTRODUODENOSCOPY (EGD) WITH PROPOFOL N/A 01/12/2017   Procedure: ESOPHAGOGASTRODUODENOSCOPY (EGD) WITH PROPOFOL;  Surgeon: Wilford Corner, MD;  Location: [facility];  Service: Endoscopy;  Laterality: N/A;   HAND TENDON SURGERY Right 10-29-2002   dr Burney Gauze @[facility]    right index and thumb   KNEE ARTHROSCOPY Bilateral 2009-;2010   @[facility]    LEFT HEART CATH AND CORONARY ANGIOGRAPHY N/A 12/25/2016   Procedure: LEFT HEART CATH AND CORONARY ANGIOGRAPHY;  Surgeon: Burnell Blanks, MD;  Location: [place] [facility];  Service: Cardiovascular;  Laterality: N/A;   LEFT HEART CATH AND CORONARY ANGIOGRAPHY N/A 10/10/2019    Procedure: LEFT HEART CATH AND CORONARY ANGIOGRAPHY;  Surgeon: Troy Sine, MD;  Location: [place] [facility];  Service: Cardiovascular;  Laterality: N/A;   LUMBAR LAMINECTOMY/DECOMPRESSION MICRODISCECTOMY N/A 06/09/2015   Procedure: Laminectomy - T12-L1;  Surgeon: Eustace Moore, MD;  Location: [place] [facility];  Service: Neurosurgery;  Laterality: N/A;  Laminectomy - T12-L1   MEDIAL PARTIAL KNEE REPLACEMENT Bilateral 2009-2010    [facility]    MELANOMA EXCISION Right    "side of my nose"   SHOULDER SURGERY Left 1985   TOTAL SHOULDER ARTHROPLASTY Left 12/06/2012   Procedure: LEFT TOTAL SHOULDER ARTHROPLASTY VERSES A REVERSE TOTAL SHOULDER ARTHROPLASTY;  Surgeon: Augustin Schooling, MD;  Location: [facility];  Service: Orthopedics;  Laterality: Left;    Family History  Problem Relation Age of Onset   CAD Father    Heart failure Father    CAD Sister    Stroke Mother    Heart failure Mother     Social History:  reports that he has never smoked. He has never used smokeless tobacco. He reports that he does not drink alcohol and does not use drugs.   Medications Prior to Admission  Medication Sig Dispense Refill   acetaminophen (TYLENOL) 325 MG tablet Take 2 tablets (650 mg total) by mouth every 6 (six) hours as needed for mild pain or headache.     Alogliptin Benzoate 12.5 MG TABS Take 12.5 mg by mouth daily. 90 tablet 1   amLODipine (NORVASC) 10 MG tablet Take 5 mg by mouth in the morning and at bedtime.     clopidogrel (PLAVIX) 75 MG tablet Take 1 tablet (75 mg total) by mouth daily. 90 tablet 3   empagliflozin (JARDIANCE) 10 MG TABS tablet Take 1 tablet (10 mg total) by mouth daily with breakfast. 90 tablet 1   ezetimibe (ZETIA) 10 MG tablet Take 1 tablet (10 mg total) by mouth daily. 90 tablet 3   insulin aspart protamine- aspart (NOVOLOG MIX 70/30) (70-30) 100 UNIT/ML injection Inject 7 Units into the skin as directed.     iron polysaccharides (NIFEREX) 150 MG capsule Take 150 mg by mouth  daily.     isosorbide mononitrate (IMDUR) 60 MG 24 hr tablet Take 1.5 tablets (90 mg total) by mouth daily. (Patient taking differently: Take 60 mg by mouth daily.) 120 tablet 3   losartan (COZAAR) 50 MG tablet Take 1 tablet (50 mg total) by mouth daily. (Patient taking differently: Take 50 mg by mouth at bedtime.) 90 tablet 1   metFORMIN (GLUCOPHAGE XR) 500 MG 24 hr tablet Take 1 tablet (500 mg total) by mouth daily with breakfast. 90 tablet 3   metoprolol tartrate (LOPRESSOR) 25 MG tablet TAKE 1 TABLET BY MOUTH TWICE A DAY 180 tablet 1   Omega-3 Fatty Acids (FISH OIL) 1000 MG CAPS Take by mouth daily.     pantoprazole (PROTONIX) 40 MG tablet Take 40 mg by mouth daily.     rosuvastatin (CRESTOR) 10 MG tablet Take 5 mg  by mouth at bedtime.     Vitamin D, Cholecalciferol, 25 MCG (1000 UT) CAPS Take 1,000 Units by mouth daily.      Zinc 50 MG CAPS Take 50 mg by mouth daily.     amoxicillin (AMOXIL) 500 MG capsule Take 2,000 mg by mouth See admin instructions. Take 2000 mg by mouth 1 hour prior to dental work     Continuous Blood Gluc Receiver (FREESTYLE LIBRE 2 READER) DEVI As directed 1 each 0   Continuous Blood Gluc Sensor (FREESTYLE LIBRE 2 SENSOR) MISC 1 Piece by Does not apply route every 14 (fourteen) days. 2 each 3   methocarbamol (ROBAXIN) 500 MG tablet Take 1 tablet (500 mg total) by mouth every 6 (six) hours as needed for muscle spasms. (Patient not taking: Reported on 03/15/2021) 40 tablet 0   nitroGLYCERIN (NITROSTAT) 0.4 MG SL tablet Place 1 tablet (0.4 mg total) under the tongue every 5 (five) minutes as needed for chest pain. 30 tablet 12     Physical Exam: Right shoulder demonstrates painful and profoundly restricted mobility with global weakness as noted at recent office visits.  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

## 2021-03-31 NOTE — Anesthesia Procedure Notes (Signed)
Anesthesia Regional Block: Interscalene brachial plexus block   Pre-Anesthetic Checklist: , timeout performed,  Correct Patient, Correct Site, Correct Laterality,  Correct Procedure, Correct Position, site marked,  Risks and benefits discussed,  Surgical consent,  Pre-op evaluation,  At surgeon's request and post-op pain management  Laterality: Right and Upper  Prep: chloraprep       Needles:  Injection technique: Single-shot  Needle Type: Echogenic Stimulator Needle     Needle Length: 9cm  Needle Gauge: 20   Needle insertion depth: 1 cm   Additional Needles:   Procedures:,,,, ultrasound used (permanent image in chart),,    Narrative:  Start time: 03/31/2021 6:50 AM End time: 03/31/2021 7:08 AM Injection made incrementally with aspirations every 5 mL.  Performed by: Personally  Anesthesiologist: Lyn Hollingshead, MD

## 2021-03-31 NOTE — Anesthesia Postprocedure Evaluation (Signed)
Anesthesia Post Note  Patient: Matthew [name]  Procedure(s) Performed: REVERSE SHOULDER ARTHROPLASTY (Right: Shoulder)     Patient location during evaluation: PACU Anesthesia Type: General Level of consciousness: awake Pain management: pain level controlled Vital Signs Assessment: post-procedure vital signs reviewed and stable Respiratory status: spontaneous breathing Cardiovascular status: stable Postop Assessment: no apparent nausea or vomiting Anesthetic complications: no   No notable events documented.  Last Vitals:  Vitals:   03/31/21 1000 03/31/21 1015  BP: 115/67 120/69  Pulse: 61 60  Resp: 13 13  Temp:    SpO2: 91% 91%    Last Pain:  Vitals:   03/31/21 1015  TempSrc:   PainSc: 0-No pain                 [name]

## 2021-04-01 ENCOUNTER — Encounter (HOSPITAL_COMMUNITY): Payer: Self-pay | Admitting: Orthopedic Surgery

## 2021-04-04 ENCOUNTER — Encounter (HOSPITAL_COMMUNITY): Payer: Self-pay | Admitting: Orthopedic Surgery

## 2021-04-11 DIAGNOSIS — Z4789 Encounter for other orthopedic aftercare: Secondary | ICD-10-CM | POA: Diagnosis not present

## 2021-04-28 ENCOUNTER — Other Ambulatory Visit: Payer: Self-pay

## 2021-04-28 ENCOUNTER — Ambulatory Visit: Payer: Medicare Other | Attending: Orthopedic Surgery | Admitting: Physical Therapy

## 2021-04-28 DIAGNOSIS — G8929 Other chronic pain: Secondary | ICD-10-CM | POA: Insufficient documentation

## 2021-04-28 DIAGNOSIS — M25611 Stiffness of right shoulder, not elsewhere classified: Secondary | ICD-10-CM | POA: Diagnosis not present

## 2021-04-28 DIAGNOSIS — M25511 Pain in right shoulder: Secondary | ICD-10-CM | POA: Insufficient documentation

## 2021-04-28 NOTE — Therapy (Signed)
[place] ?[facility] ?[place] ?[place], [place], [address] ?Phone: [phone]   Fax:  [phone] ? ?Physical Therapy Evaluation ? ?Patient Details  ?Name: Matthew [name] ?MRN: [id number] ?Date of Birth: Jul 06, 1940 ?Referring Provider (PT): Bard Herbert MD ? ? ?Encounter Date: 04/28/2021 ? ? PT End of Session - 04/28/21 1042   ? ? Visit Number 1   ? Number of Visits 8   ? Date for PT Re-Evaluation 05/26/21   ? PT Start Time [phone]   ? PT Stop Time 1031   ? PT Time Calculation (min) 40 min   ? Activity Tolerance Patient tolerated treatment well   ? Behavior During Therapy [facility] for tasks assessed/performed   ? ?  ?  ? ?  ? ? ?Past Medical History:  ?Diagnosis Date  ? Anticoagulated   ? plavix  ? Arthritis   ? Shoulder, knees, back   ? CAD in native artery cardiologist-  dr Angelena Form  ? a. CAD/NSTEMI ,  cardiac cath staged stenting-- 12-25-2016  PTCA and DES x3 to prox. and mid LAD;  12-26-2016  PCI to PLA and DES x1 to midRCA,  EF 60-65%.  ? Chronic lower back pain   ? CKD (chronic kidney disease), stage III ([place])   ? Complication of anesthesia   ? "Too much with shoulder surgery", pt. reports that he was told the at they "lost him, due to absorbing too much anesthesia".  shoulder surgery 1985  ? DDD (degenerative disc disease), lumbosacral   ? GERD (gastroesophageal reflux disease)   ? History of gastric ulcer 01/2017  ? History of kidney stones   ? History of malignant melanoma   ? right side of nose  ? History of non-ST elevation myocardial infarction (NSTEMI) 12/23/2017  ? s/p  staged cardiac cath,  s/p  PCI and DEStenting  ? Hyperlipidemia   ? Hypertension   ? IDA (iron deficiency anemia)   ? Myocardial infarction ([place])   ? x 2  ? Neuromuscular disorder ([place])   ? neuropathy  ? Nocturia   ? RBBB   ? Renal calculus, right   ? S/P drug eluting coronary stent placement 12-25-2017,  12-26-2017  ? PTCA and DES x3 to prox. and mid LAD;  PCI to PLA and DES x1 to [facility]  ? Type 2  diabetes mellitus treated with insulin ([place])   ? followed by pcp  ? ? ?Past Surgical History:  ?Procedure Laterality Date  ? ANAL FISSURE REPAIR  X 2  ? BACK SURGERY    ? CATARACT EXTRACTION W/ INTRAOCULAR LENS  IMPLANT, BILATERAL Bilateral 2017;  2015  ? COLONOSCOPY WITH PROPOFOL N/A 06/05/2016  ? Procedure: COLONOSCOPY WITH PROPOFOL;  Surgeon: Garlan Fair, MD;  [facility];  Service: Endoscopy;  Laterality: N/A;  ? CONVERSION TO TOTAL KNEE Right 08/25/2020  ? Procedure: Revision right knee unicompartmental arthroplasty to total knee arthroplasty;  Surgeon: Gaynelle Arabian, MD;  [facility]: [facility];  Service: Orthopedics;  Laterality: Right;  ? CORONARY ANGIOGRAPHY N/A 12/26/2016  ? Procedure: CORONARY ANGIOGRAPHY;  Surgeon: Troy Sine, MD;  Location: [place] [facility];  Service: Cardiovascular;  Laterality: N/A;  ? CORONARY STENT INTERVENTION N/A 12/25/2016  ? Procedure: CORONARY STENT INTERVENTION;  Surgeon: Burnell Blanks, MD;  Location: [place] [facility];  Service: Cardiovascular;  Laterality: N/A;  ? CORONARY STENT INTERVENTION N/A 12/26/2016  ? Procedure: CORONARY STENT INTERVENTION;  Surgeon: Troy Sine, MD;  Location: [place] [facility];  Service: Cardiovascular;  Laterality: N/A;  ? CYSTOSCOPY/URETEROSCOPY/HOLMIUM LASER/STENT PLACEMENT Right 01/07/2018  ? Procedure: RIGHT URETEROSCOPY/HOLMIUM LASER/STENT PLACEMENT;  Surgeon: Lucas Mallow, MD;  Location: [facility];  Service: Urology;  Laterality: Right;  ? ESOPHAGOGASTRODUODENOSCOPY (EGD) WITH PROPOFOL N/A 01/12/2017  ? Procedure: ESOPHAGOGASTRODUODENOSCOPY (EGD) WITH PROPOFOL;  Surgeon: Wilford Corner, MD;  Location: [place];  Service: Endoscopy;  Laterality: N/A;  ? HAND TENDON SURGERY Right 10-29-2002   dr Burney Gauze '@[facility]'$   ? right index and thumb  ? KNEE ARTHROSCOPY Bilateral 2009-;2010   '@[facility]   ? LEFT HEART CATH AND CORONARY ANGIOGRAPHY N/A 12/25/2016  ? Procedure: LEFT HEART CATH AND  CORONARY ANGIOGRAPHY;  Surgeon: Burnell Blanks, MD;  Location: [place] [facility];  Service: Cardiovascular;  Laterality: N/A;  ? LEFT HEART CATH AND CORONARY ANGIOGRAPHY N/A 10/10/2019  ? Procedure: LEFT HEART CATH AND CORONARY ANGIOGRAPHY;  Surgeon: Troy Sine, MD;  Location: [place] [facility];  Service: Cardiovascular;  Laterality: N/A;  ? LUMBAR LAMINECTOMY/DECOMPRESSION MICRODISCECTOMY N/A 06/09/2015  ? Procedure: Laminectomy - T12-L1;  Surgeon: Eustace Moore, MD;  Location: [place] [facility];  Service: Neurosurgery;  Laterality: N/A;  Laminectomy - T12-L1  ? MEDIAL PARTIAL KNEE REPLACEMENT Bilateral 2009-2010  ?  [facility]   ? MELANOMA EXCISION Right   ? "side of my nose"  ? REVERSE SHOULDER ARTHROPLASTY Right 03/31/2021  ? Procedure: REVERSE SHOULDER ARTHROPLASTY;  Surgeon: Justice Britain, MD;  [facility];  Service: Orthopedics;  Laterality: Right;  166mn  ? SHOULDER SURGERY Left 1985  ? TOTAL SHOULDER ARTHROPLASTY Left 12/06/2012  ? Procedure: LEFT TOTAL SHOULDER ARTHROPLASTY VERSES A REVERSE TOTAL SHOULDER ARTHROPLASTY;  Surgeon: SAugustin Schooling MD;  [place]  Service: Orthopedics;  Laterality: Left;  ? ? ?There were no vitals filed for this visit. ? ? ? Subjective Assessment - 04/28/21 1031   ? ? Subjective COVID-19 screen performed prior to patient entering clinic.  The patient underwent a right reverse total shoulder replacement on 03/31/21.  His pain-level is a 4/10 today.  He states his shoulder hurts alot when he tries to reach behind his back.  I told him to avoid this motion.  He has been compliant to his HEP and has actively moved his right shoulder while in supine.  Resting and ice decrease his pain.   ? Pertinent History CAD, coronary stent, chroinic low back pain, left TSA, right hand surgery, HTN, low back surgery, prior right knee surgery, right TKA.   ? Patient Stated Goals Use right UE without pain.   ? Currently in Pain? Yes   ? Pain Score 4    ? Pain Location Shoulder    ? Pain Orientation Right   ? Pain Descriptors / Indicators Aching   ? Pain Type Surgical pain   ? Pain Onset 1 to 4 weeks ago   ? Aggravating Factors  See above.   ? Pain Relieving Factors See above.   ? ?  ?  ? ?  ? ? ? ? ? [facility] PT Assessment - 04/28/21 0001   ? ?  ? Assessment  ? Medical Diagnosis Reveres total shoulder replacment   ? Referring Provider (PT) KBard HerbertMD   ? Onset Date/Surgical Date --   03/31/21 (surgery date).  ?  ? Precautions  ? Precaution Comments PLEASE FOLLOW PROTOCOL.   ?  ? Restrictions  ? Other Position/Activity Restrictions No right UE weight bearing.   ?  ? Balance Screen  ?  Has the patient fallen in the past 6 months Yes   ? How many times? 2.  Strongly encourgaed patient to use a cane.   ? Has the patient had a decrease in activity level because of a fear of falling?  No   ? Is the patient reluctant to leave their home because of a fear of falling?  No   ?  ? Home Environment  ? Living Environment Private residence   ?  ? ROM / Strength  ? AROM / PROM / Strength PROM   ?  ? PROM  ? Overall PROM Comments In supine:  Gentle passive right shoulder flexion to 105 degrees and ER to 25 degrees.   ?  ? Palpation  ? Palpation comment Tender to palpation over his right anterior shoulder.   ? ?  ?  ? ?  ? ? ? ? ? ? ? ? ? ? ? ? ? ?Objective measurements completed on examination: See above findings.  ? ? ? ? ? [facility] Adult PT Treatment/Exercise - 04/28/21 0001   ? ?  ? Modalities  ? Modalities Vasopneumatic   ?  ? Vasopneumatic  ? Number Minutes Vasopneumatic  15 minutes   ? Vasopnuematic Location  --   Right shoulder with pillow between thorax and elbow.  ? Vasopneumatic Pressure Low   ? ?  ?  ? ?  ? ? ? ? ? ? ? ? ? ? ? ? PT Short Term Goals - 11/16/20 1643   ? ?  ? PT SHORT TERM GOAL #1  ? Title Patient will be able to report signs and symptoms of BPPV   ? Time 2   ? Period Weeks   ? Status New   ? Target Date 11/30/20   ? ?  ?  ? ?  ? ? ? ? PT Long Term Goals - 04/28/21 1243   ? ?  ? PT  LONG TERM GOAL #1  ? Title Independent with a HEP.   ? Time 4   ? Period Weeks   ? Status New   ?  ? PT LONG TERM GOAL #2  ? Title Active right shoulder flexion to 125 degrees so the patient can easily reach o

## 2021-04-28 NOTE — Patient Instructions (Signed)
Matthew [name] ?Created by Mali Jaxston Chohan Mar 23rd, 2023 ?View at [email or web address]-exercise-[email or web address] using code: [id number] ?Total 1 Page 1 of 1 ?WAND EXTERNAL ROTATION - SUPINE ER ?Lie on your back holding a cane or wand with both hands. ?On the affected side, place a small rolled up towel or pillow under ?your elbow. Maintain approx. 90 degree bend at the elbow with ?your arm approximately 30-45 degrees away from your side. ?GENTLE. PAIN-FREE ?Use your other arm to pull the wand/cane to rotate the affected ?arm back into a stretch. Hold and then return to starting position ?and then repeat. ?Repeat 10 Times Hold 15 Seconds ?Complete 1 Set Perform 4 Times a Day ?

## 2021-05-04 ENCOUNTER — Ambulatory Visit: Payer: Medicare Other | Admitting: Physical Therapy

## 2021-05-04 DIAGNOSIS — M25611 Stiffness of right shoulder, not elsewhere classified: Secondary | ICD-10-CM

## 2021-05-04 DIAGNOSIS — G8929 Other chronic pain: Secondary | ICD-10-CM | POA: Diagnosis not present

## 2021-05-04 DIAGNOSIS — M25511 Pain in right shoulder: Secondary | ICD-10-CM | POA: Diagnosis not present

## 2021-05-04 NOTE — Therapy (Signed)
[place] ?[facility] ?[place] ?[place], [place], [address] ?Phone: [phone]   Fax:  [phone] ? ?Physical Therapy Treatment ? ?Patient Details  ?Name: Matthew [name] ?MRN: [id number] ?Date of Birth: Jun 13, 1940 ?Referring Provider (PT): Bard Herbert MD ? ? ?Encounter Date: 05/04/2021 ? ? PT End of Session - 05/04/21 1416   ? ? Visit Number 2   ? Number of Visits 8   ? Date for PT Re-Evaluation 05/26/21   ? Authorization Type UHC Medicare no auth or VL   ? PT Start Time 0145   ? PT Stop Time 0228   ? PT Time Calculation (min) 43 min   ? Activity Tolerance Patient tolerated treatment well   ? Behavior During Therapy [facility] for tasks assessed/performed   ? ?  ?  ? ?  ? ? ?Past Medical History:  ?Diagnosis Date  ? Anticoagulated   ? plavix  ? Arthritis   ? Shoulder, knees, back   ? CAD in native artery cardiologist-  dr Angelena Form  ? a. CAD/NSTEMI ,  cardiac cath staged stenting-- 12-25-2016  PTCA and DES x3 to prox. and mid LAD;  12-26-2016  PCI to PLA and DES x1 to midRCA,  EF 60-65%.  ? Chronic lower back pain   ? CKD (chronic kidney disease), stage III ([place])   ? Complication of anesthesia   ? "Too much with shoulder surgery", pt. reports that he was told the at they "lost him, due to absorbing too much anesthesia".  shoulder surgery 1985  ? DDD (degenerative disc disease), lumbosacral   ? GERD (gastroesophageal reflux disease)   ? History of gastric ulcer 01/2017  ? History of kidney stones   ? History of malignant melanoma   ? right side of nose  ? History of non-ST elevation myocardial infarction (NSTEMI) 12/23/2017  ? s/p  staged cardiac cath,  s/p  PCI and DEStenting  ? Hyperlipidemia   ? Hypertension   ? IDA (iron deficiency anemia)   ? Myocardial infarction ([place])   ? x 2  ? Neuromuscular disorder ([place])   ? neuropathy  ? Nocturia   ? RBBB   ? Renal calculus, right   ? S/P drug eluting coronary stent placement 12-25-2017,  12-26-2017  ? PTCA and DES x3 to prox. and mid  LAD;  PCI to PLA and [facility] to [facility]  ? Type 2 diabetes mellitus treated with insulin ([place])   ? followed by pcp  ? ? ?Past Surgical History:  ?Procedure Laterality Date  ? ANAL FISSURE REPAIR  X 2  ? BACK SURGERY    ? CATARACT EXTRACTION W/ INTRAOCULAR LENS  IMPLANT, BILATERAL Bilateral 2017;  2015  ? COLONOSCOPY WITH PROPOFOL N/A 06/05/2016  ? Procedure: COLONOSCOPY WITH PROPOFOL;  Surgeon: Garlan Fair, MD;  [facility];  Service: Endoscopy;  Laterality: N/A;  ? CONVERSION TO TOTAL KNEE Right 08/25/2020  ? Procedure: Revision right knee unicompartmental arthroplasty to total knee arthroplasty;  Surgeon: Gaynelle Arabian, MD;  [facility];  Service: Orthopedics;  Laterality: Right;  ? CORONARY ANGIOGRAPHY N/A 12/26/2016  ? Procedure: CORONARY ANGIOGRAPHY;  Surgeon: Troy Sine, MD;  Location: [place] [facility];  Service: Cardiovascular;  Laterality: N/A;  ? CORONARY STENT INTERVENTION N/A 12/25/2016  ? Procedure: CORONARY STENT INTERVENTION;  Surgeon: Burnell Blanks, MD;  Location: [place] [facility];  Service: Cardiovascular;  Laterality: N/A;  ? CORONARY STENT INTERVENTION N/A 12/26/2016  ? Procedure: CORONARY STENT INTERVENTION;  Surgeon: Troy Sine, MD;  Location: [place] [facility];  Service: Cardiovascular;  Laterality: N/A;  ? CYSTOSCOPY/URETEROSCOPY/HOLMIUM LASER/STENT PLACEMENT Right 01/07/2018  ? Procedure: RIGHT URETEROSCOPY/HOLMIUM LASER/STENT PLACEMENT;  Surgeon: Lucas Mallow, MD;  Location: [facility];  Service: Urology;  Laterality: Right;  ? ESOPHAGOGASTRODUODENOSCOPY (EGD) WITH PROPOFOL N/A 01/12/2017  ? Procedure: ESOPHAGOGASTRODUODENOSCOPY (EGD) WITH PROPOFOL;  Surgeon: Wilford Corner, MD;  Location: [place];  Service: Endoscopy;  Laterality: N/A;  ? HAND TENDON SURGERY Right 10-29-2002   dr Burney Gauze '@[facility]'$   ? right index and thumb  ? KNEE ARTHROSCOPY Bilateral 2009-;2010   '@[facility]   ? LEFT HEART CATH AND CORONARY ANGIOGRAPHY N/A  12/25/2016  ? Procedure: LEFT HEART CATH AND CORONARY ANGIOGRAPHY;  Surgeon: Burnell Blanks, MD;  Location: [place] [facility];  Service: Cardiovascular;  Laterality: N/A;  ? LEFT HEART CATH AND CORONARY ANGIOGRAPHY N/A 10/10/2019  ? Procedure: LEFT HEART CATH AND CORONARY ANGIOGRAPHY;  Surgeon: Troy Sine, MD;  Location: [place] [facility];  Service: Cardiovascular;  Laterality: N/A;  ? LUMBAR LAMINECTOMY/DECOMPRESSION MICRODISCECTOMY N/A 06/09/2015  ? Procedure: Laminectomy - T12-L1;  Surgeon: Eustace Moore, MD;  Location: [place] [facility];  Service: Neurosurgery;  Laterality: N/A;  Laminectomy - T12-L1  ? MEDIAL PARTIAL KNEE REPLACEMENT Bilateral 2009-2010  ?  [facility]   ? MELANOMA EXCISION Right   ? "side of my nose"  ? REVERSE SHOULDER ARTHROPLASTY Right 03/31/2021  ? Procedure: REVERSE SHOULDER ARTHROPLASTY;  Surgeon: Justice Britain, MD;  [facility];  Service: Orthopedics;  Laterality: Right;  146mn  ? SHOULDER SURGERY Left 1985  ? TOTAL SHOULDER ARTHROPLASTY Left 12/06/2012  ? Procedure: LEFT TOTAL SHOULDER ARTHROPLASTY VERSES A REVERSE TOTAL SHOULDER ARTHROPLASTY;  Surgeon: SAugustin Schooling MD;  [place]  Service: Orthopedics;  Laterality: Left;  ? ? ?There were no vitals filed for this visit. ? ? Subjective Assessment - 05/04/21 1416   ? ? Subjective No new complaints.   ? Pertinent History CAD, coronary stent, chroinic low back pain, left TSA, right hand surgery, HTN, low back surgery, prior right knee surgery, right TKA.   ? Patient Stated Goals Use right UE without pain.   ? Currently in Pain? Yes   ? Pain Score 4    ? Pain Location Shoulder   ? Pain Orientation Right   ? Pain Descriptors / Indicators Aching   ? Pain Type Surgical pain   ? Pain Onset More than a month ago   ? ?  ?  ? ?  ? ? ? ? ? ? ? ? ? ? ? ? ? ? ? ? ? ? ? ? [facility]Ex[facility] - 05/04/21 0001   ? ?  ? Vasopneumatic  ? Number Minutes Vasopneumatic  15 minutes   ? Vasopnuematic Location  --   Right  shoulder with pillow between thorax and elbow.  ? Vasopneumatic Pressure Low   ?  ? Manual Therapy  ? Manual Therapy Passive ROM   ? Passive ROM In supine:  Gentle passive range of motion per protocol to patient's right shoulder x 23 minutes.   ? ?  ?  ? ?  ? ? ? ? ? ? ? ? ? ? ? ? PT Short Term Goals - 11/16/20 1643   ? ?  ? PT SHORT TERM GOAL #1  ? Title Patient will be able to report signs and symptoms of BPPV   ? Time 2   ?  Period Weeks   ? Status New   ? Target Date 11/30/20   ? ?  ?  ? ?  ? ? ? ? PT Long Term Goals - 04/28/21 1243   ? ?  ? PT LONG TERM GOAL #1  ? Title Independent with a HEP.   ? Time 4   ? Period Weeks   ? Status New   ?  ? PT LONG TERM GOAL #2  ? Title Active right shoulder flexion to 125 degrees so the patient can easily reach overhead.   ? Time 4   ? Period Weeks   ? Status New   ?  ? PT LONG TERM GOAL #3  ? Title Active ER to 60 degrees+ to allow for easily donning/doffing of apparel.   ? Time 4   ? Period Weeks   ? Status New   ?  ? PT LONG TERM GOAL #4  ? Title Perform ADL?s with pain not > 3/10.   ? Time 4   ? Period Weeks   ? Status New   ? ?  ?  ? ?  ? ? ? ? ? ? ? ? Plan - 05/04/21 1420   ? ? Clinical Impression Statement Patient tolerated treatment without complaint.  She is very pleased with his progress thus far.  States he was able to brush is teeth without any increase in shoulder pain.   ? Personal Factors and Comorbidities Other;Comorbidity 1;Comorbidity 2   ? Comorbidities CAD, coronary stent, chroinic low back pain, left TSA, right hand surgery, HTN, low back surgery, prior right knee surgery, right TKA.   ? Examination-Activity Limitations Other;Reach Overhead   ? Examination-Participation Restrictions Other   ? Stability/Clinical Decision Making Stable/Uncomplicated   ? Rehab Potential Good   ? PT Frequency 2x / week   ? PT Duration 4 weeks   ? PT Treatment/Interventions ADLs/Self Care Home Management;Cryotherapy;Electrical Stimulation;Therapeutic activities;Therapeutic  exercise;Neuromuscular re-education;Manual techniques;Patient/family education;Vasopneumatic Device   ? PT Next Visit Plan FOTO...Marland KitchenMarland Kitchenplease follow protocol.  Vasopneumatic with pillow between right elbow and tho

## 2021-05-09 ENCOUNTER — Ambulatory Visit: Payer: Medicare Other | Attending: Orthopedic Surgery | Admitting: Physical Therapy

## 2021-05-09 ENCOUNTER — Encounter: Payer: Self-pay | Admitting: Physical Therapy

## 2021-05-09 DIAGNOSIS — G8929 Other chronic pain: Secondary | ICD-10-CM | POA: Insufficient documentation

## 2021-05-09 DIAGNOSIS — M25611 Stiffness of right shoulder, not elsewhere classified: Secondary | ICD-10-CM | POA: Insufficient documentation

## 2021-05-09 DIAGNOSIS — M25511 Pain in right shoulder: Secondary | ICD-10-CM | POA: Diagnosis not present

## 2021-05-09 NOTE — Therapy (Signed)
[facility] ?[address] ?[place], [place], [address] ?Phone: [phone]   Fax:  [phone] ? ?Physical Therapy Treatment ? ?Patient Details  ?Name: Matthew [name] ?MRN: [id number] ?Date of Birth: [birth year] ?Referring Provider (PT): Bard Herbert MD ? ? ?Encounter Date: 05/09/2021 ? ? PT End of Session - 05/09/21 1434   ? ? Visit Number 3   ? Number of Visits 8   ? Date for PT Re-Evaluation 05/26/21   ? Authorization Type UHC Medicare no auth or VL   ? PT Start Time 1434   ? PT Stop Time 3762   ? PT Time Calculation (min) 37 min   ? Activity Tolerance Patient tolerated treatment well   ? Behavior During Therapy [facility] for tasks assessed/performed   ? ?  ?  ? ?  ? ? ?Past Medical History:  ?Diagnosis Date  ? Anticoagulated   ? plavix  ? Arthritis   ? Shoulder, knees, back   ? CAD in native artery cardiologist-  dr Angelena Form  ? a. CAD/NSTEMI ,  cardiac cath staged stenting-- 12-25-2016  PTCA and DES x3 to prox. and mid LAD;  12-26-2016  PCI to PLA and DES x1 to midRCA,  EF 60-65%.  ? Chronic lower back pain   ? CKD (chronic kidney disease), stage III ([place])   ? Complication of anesthesia   ? "Too much with shoulder surgery", pt. reports that he was told the at they "lost him, due to absorbing too much anesthesia".  shoulder surgery 1985  ? DDD (degenerative disc disease), lumbosacral   ? GERD (gastroesophageal reflux disease)   ? History of gastric ulcer 01/2017  ? History of kidney stones   ? History of malignant melanoma   ? right side of nose  ? History of non-ST elevation myocardial infarction (NSTEMI) 12/23/2017  ? s/p  staged cardiac cath,  s/p  PCI and DEStenting  ? Hyperlipidemia   ? Hypertension   ? IDA (iron deficiency anemia)   ? Myocardial infarction ([place])   ? x 2  ? Neuromuscular disorder ([place])   ? neuropathy  ? Nocturia   ? RBBB   ? Renal calculus, right   ? S/P drug eluting coronary stent placement 12-25-2017,  12-26-2017  ? PTCA and DES x3 to prox. and mid LAD;   PCI to PLA and DES x1 to [facility]  ? Type 2 diabetes mellitus treated with insulin ([place])   ? followed by pcp  ? ? ?Past Surgical History:  ?Procedure Laterality Date  ? ANAL FISSURE REPAIR  X 2  ? BACK SURGERY    ? CATARACT EXTRACTION W/ INTRAOCULAR LENS  IMPLANT, BILATERAL Bilateral 2017;  2015  ? COLONOSCOPY WITH PROPOFOL N/A 06/05/2016  ? Procedure: COLONOSCOPY WITH PROPOFOL;  Surgeon: Garlan Fair, MD;  [facility]: [facility];  Service: Endoscopy;  Laterality: N/A;  ? CONVERSION TO TOTAL KNEE Right 08/25/2020  ? Procedure: Revision right knee unicompartmental arthroplasty to total knee arthroplasty;  Surgeon: Gaynelle Arabian, MD;  Location: [facility];  Service: Orthopedics;  Laterality: Right;  ? CORONARY ANGIOGRAPHY N/A 12/26/2016  ? Procedure: CORONARY ANGIOGRAPHY;  Surgeon: Troy Sine, MD;  Location: [place] [facility];  Service: Cardiovascular;  Laterality: N/A;  ? CORONARY STENT INTERVENTION N/A 12/25/2016  ? Procedure: CORONARY STENT INTERVENTION;  Surgeon: Burnell Blanks, MD;  Location: [place] [facility];  Service: Cardiovascular;  Laterality: N/A;  ? CORONARY STENT INTERVENTION N/A 12/26/2016  ? Procedure: CORONARY STENT INTERVENTION;  Surgeon: Troy Sine, MD;  Location: [place] [facility];  Service: Cardiovascular;  Laterality: N/A;  ? CYSTOSCOPY/URETEROSCOPY/HOLMIUM LASER/STENT PLACEMENT Right 01/07/2018  ? Procedure: RIGHT URETEROSCOPY/HOLMIUM LASER/STENT PLACEMENT;  Surgeon: Lucas Mallow, MD;  Location: [facility];  Service: Urology;  Laterality: Right;  ? ESOPHAGOGASTRODUODENOSCOPY (EGD) WITH PROPOFOL N/A 01/12/2017  ? Procedure: ESOPHAGOGASTRODUODENOSCOPY (EGD) WITH PROPOFOL;  Surgeon: Wilford Corner, MD;  Location: [place];  Service: Endoscopy;  Laterality: N/A;  ? HAND TENDON SURGERY Right 10-29-2002   dr Burney Gauze '@[facility]'$   ? right index and thumb  ? KNEE ARTHROSCOPY Bilateral 2009-;2010   '@[facility]'$   ? LEFT HEART CATH AND CORONARY ANGIOGRAPHY N/A  12/25/2016  ? Procedure: LEFT HEART CATH AND CORONARY ANGIOGRAPHY;  Surgeon: Burnell Blanks, MD;  Location: [place] [facility];  Service: Cardiovascular;  Laterality: N/A;  ? LEFT HEART CATH AND CORONARY ANGIOGRAPHY N/A 10/10/2019  ? Procedure: LEFT HEART CATH AND CORONARY ANGIOGRAPHY;  Surgeon: Troy Sine, MD;  Location: [place] [facility];  Service: Cardiovascular;  Laterality: N/A;  ? LUMBAR LAMINECTOMY/DECOMPRESSION MICRODISCECTOMY N/A 06/09/2015  ? Procedure: Laminectomy - T12-L1;  Surgeon: Eustace Moore, MD;  Location: [place] [facility];  Service: Neurosurgery;  Laterality: N/A;  Laminectomy - T12-L1  ? MEDIAL PARTIAL KNEE REPLACEMENT Bilateral 2009-2010  ?  [facility]   ? MELANOMA EXCISION Right   ? "side of my nose"  ? REVERSE SHOULDER ARTHROPLASTY Right 03/31/2021  ? Procedure: REVERSE SHOULDER ARTHROPLASTY;  Surgeon: Justice Britain, MD;  [facility]: [facility];  Service: Orthopedics;  Laterality: Right;  165mn  ? SHOULDER SURGERY Left 1985  ? TOTAL SHOULDER ARTHROPLASTY Left 12/06/2012  ? Procedure: LEFT TOTAL SHOULDER ARTHROPLASTY VERSES A REVERSE TOTAL SHOULDER ARTHROPLASTY;  Surgeon: SAugustin Schooling MD;  Location: [place]  Service: Orthopedics;  Laterality: Left;  ? ? ?There were no vitals filed for this visit. ? ? Subjective Assessment - 05/09/21 1430   ? ? Subjective Reports more pain if reaching to put on deodarent under L shoulder and reaching to tuck his shirt in.   ? Pertinent History CAD, coronary stent, chroinic low back pain, left TSA, right hand surgery, HTN, low back surgery, prior right knee surgery, right TKA.   ? Patient Stated Goals Use right UE without pain.   ? Currently in Pain? Yes   ? Pain Score 1    ? Pain Location Shoulder   ? Pain Orientation Right   ? Pain Descriptors / Indicators Aching   ? Pain Type Surgical pain   ? Pain Onset More than a month ago   ? Pain Frequency Intermittent   ? ?  ?  ? ?  ? ? ? ? ? [facility] PT Assessment - 05/09/21 0001   ? ?  ? Assessment  ? Medical  Diagnosis Reveres total shoulder replacment   ? Referring Provider (PT) KBard HerbertMD   ? Onset Date/Surgical Date 03/31/21   ?  ? ROM / Strength  ? AROM / PROM / Strength PROM   ?  ? PROM  ? Overall PROM  Within functional limits for tasks performed   ? PROM Assessment Site Shoulder   ? Right/Left Shoulder Right   ? Right Shoulder Flexion 150 Degrees   ? Right Shoulder External Rotation 80 Degrees   ? ?  ?  ? ?  ? ? ? ? ? ? ? ? ? ? ? ? ? ? ? ? [facility] PT Treatment/Exercise - 05/09/21 0001   ? ?  ?  Modalities  ? Modalities Vasopneumatic   ?  ? Vasopneumatic  ? Number Minutes Vasopneumatic  10 minutes   ? Vasopnuematic Location  Shoulder   ? Vasopneumatic Pressure Low   ? Vasopneumatic Temperature  34 degrees for edema   ?  ? Manual Therapy  ? Manual Therapy Passive ROM   ? Passive ROM PROM of R shoulder into flex, ER with holds at end range   ? ?  ?  ? ?  ? ? ? ? ? ? ? ? ? ? ? ? ? ? ? PT Long Term Goals - 04/28/21 1243   ? ?  ? PT LONG TERM GOAL #1  ? Title Independent with a HEP.   ? Time 4   ? Period Weeks   ? Status New   ?  ? PT LONG TERM GOAL #2  ? Title Active right shoulder flexion to 125 degrees so the patient can easily reach overhead.   ? Time 4   ? Period Weeks   ? Status New   ?  ? PT LONG TERM GOAL #3  ? Title Active ER to 60 degrees+ to allow for easily donning/doffing of apparel.   ? Time 4   ? Period Weeks   ? Status New   ?  ? PT LONG TERM GOAL #4  ? Title Perform ADL?s with pain not > 3/10.   ? Time 4   ? Period Weeks   ? Status New   ? ?  ?  ? ?  ? ? ? ? ? ? ? ? Plan - 05/09/21 1558   ? ? Clinical Impression Statement Patient presents in clinic with minimal R shoulder ache. Has been cautioned regarding IR to tuck in his shirt. Has only intermittant R shoulder discomfort with IR to tuck in his shirt and reach horizontal adduct to don deodarent under LUE. Patient's PROM measurements are outlined in today's note and WNL. Firm end feels and smooth arc of motion noted during PROM. Normal  vasopneumatic response noted following removal of the modality.   ? Personal Factors and Comorbidities Other;Comorbidity 1;Comorbidity 2   ? Comorbidities CAD, coronary stent, chroinic low back pain, left TSA, right hand

## 2021-05-11 ENCOUNTER — Ambulatory Visit: Payer: Medicare Other | Admitting: Physical Therapy

## 2021-05-16 ENCOUNTER — Ambulatory Visit: Payer: Medicare Other | Admitting: Physical Therapy

## 2021-05-16 DIAGNOSIS — G8929 Other chronic pain: Secondary | ICD-10-CM | POA: Diagnosis not present

## 2021-05-16 DIAGNOSIS — M25611 Stiffness of right shoulder, not elsewhere classified: Secondary | ICD-10-CM | POA: Diagnosis not present

## 2021-05-16 DIAGNOSIS — M25511 Pain in right shoulder: Secondary | ICD-10-CM | POA: Diagnosis not present

## 2021-05-16 NOTE — Therapy (Addendum)
[facility] [address] [place], [place], [address] Phone: [phone]   Fax:  [phone]  Physical Therapy Treatment  Patient Details  Name: Matthew [name] MRN: [id number] Date of Birth: 1940-09-02 Referring Provider (PT): Felton Clinton MD   Encounter Date: 05/16/2021   PT End of Session - 05/16/21 1125     Visit Number 4    Number of Visits 8    Date for PT Re-Evaluation 05/26/21    Authorization Type UHC Medicare no auth or VL    PT Start Time 1115    PT Stop Time 1157    PT Time Calculation (min) 42 min    Activity Tolerance Patient tolerated treatment well    Behavior During Therapy [facility] for tasks assessed/performed             Past Medical History:  Diagnosis Date   Anticoagulated    plavix   Arthritis    Shoulder, knees, back    CAD in native artery cardiologist-  dr Clifton James   a. CAD/NSTEMI ,  cardiac cath staged stenting-- 12-25-2016  PTCA and DES x3 to prox. and mid LAD;  12-26-2016  PCI to PLA and DES x1 to midRCA,  EF 60-65%.   Chronic lower back pain    CKD (chronic kidney disease), stage III ([facility])    Complication of anesthesia    "Too much with shoulder surgery", pt. reports that he was told the at they "lost him, due to absorbing too much anesthesia".  shoulder surgery 1985   DDD (degenerative disc disease), lumbosacral    GERD (gastroesophageal reflux disease)    History of gastric ulcer 01/2017   History of kidney stones    History of malignant melanoma    right side of nose   History of non-ST elevation myocardial infarction (NSTEMI) 12/23/2017   s/p  staged cardiac cath,  s/p  PCI and DEStenting   Hyperlipidemia    Hypertension    IDA (iron deficiency anemia)    Myocardial infarction ([facility])    x 2   Neuromuscular disorder ([facility])    neuropathy   Nocturia    RBBB    Renal calculus, right    S/P drug eluting coronary stent placement 12-25-2017,  12-26-2017   PTCA and DES x3 to prox. and mid  LAD;  PCI to PLA and DES x1 to midRCA   Type 2 diabetes mellitus treated with insulin ([facility])    followed by pcp    Past Surgical History:  Procedure Laterality Date   ANAL FISSURE REPAIR  X 2   BACK SURGERY     CATARACT EXTRACTION W/ INTRAOCULAR LENS  IMPLANT, BILATERAL Bilateral 2017;  2015   COLONOSCOPY WITH PROPOFOL N/A 06/05/2016   Procedure: COLONOSCOPY WITH PROPOFOL;  Surgeon: Charolett Bumpers, MD;  [facility];  Service: Endoscopy;  Laterality: N/A;   CONVERSION TO TOTAL KNEE Right 08/25/2020   Procedure: Revision right knee unicompartmental arthroplasty to total knee arthroplasty;  Surgeon: Ollen Gross, MD;  [facility];  Service: Orthopedics;  Laterality: Right;   CORONARY ANGIOGRAPHY N/A 12/26/2016   Procedure: CORONARY ANGIOGRAPHY;  Surgeon: Lennette Bihari, MD;  [facility];  Service: Cardiovascular;  Laterality: N/A;   CORONARY STENT INTERVENTION N/A 12/25/2016   Procedure: CORONARY STENT INTERVENTION;  Surgeon: Kathleene Hazel, MD;  [facility];  Service: Cardiovascular;  Laterality: N/A;   CORONARY STENT INTERVENTION N/A 12/26/2016   Procedure: CORONARY STENT INTERVENTION;  Surgeon: Lennette Bihari, MD;  Location: [facility] LAB;  Service: Cardiovascular;  Laterality: N/A;   CYSTOSCOPY/URETEROSCOPY/HOLMIUM LASER/STENT PLACEMENT Right 01/07/2018   Procedure: RIGHT URETEROSCOPY/HOLMIUM LASER/STENT PLACEMENT;  Surgeon: Crista Elliot, MD;  Location: [facility];  Service: Urology;  Laterality: Right;   ESOPHAGOGASTRODUODENOSCOPY (EGD) WITH PROPOFOL N/A 01/12/2017   Procedure: ESOPHAGOGASTRODUODENOSCOPY (EGD) WITH PROPOFOL;  Surgeon: Charlott Rakes, MD;  Location: [facility];  Service: Endoscopy;  Laterality: N/A;   HAND TENDON SURGERY Right 10-29-2002   dr Mina Marble @[facility]    right index and thumb   KNEE ARTHROSCOPY Bilateral 2009-;2010   @[facility]    LEFT HEART CATH AND CORONARY ANGIOGRAPHY N/A  12/25/2016   Procedure: LEFT HEART CATH AND CORONARY ANGIOGRAPHY;  Surgeon: Kathleene Hazel, MD;  [facility];  Service: Cardiovascular;  Laterality: N/A;   LEFT HEART CATH AND CORONARY ANGIOGRAPHY N/A 10/10/2019   Procedure: LEFT HEART CATH AND CORONARY ANGIOGRAPHY;  Surgeon: Lennette Bihari, MD;  [facility];  Service: Cardiovascular;  Laterality: N/A;   LUMBAR LAMINECTOMY/DECOMPRESSION MICRODISCECTOMY N/A 06/09/2015   Procedure: Laminectomy - T12-L1;  Surgeon: Tia Alert, MD;  [facility];  Service: Neurosurgery;  Laterality: N/A;  Laminectomy - T12-L1   MEDIAL PARTIAL KNEE REPLACEMENT Bilateral 2009-2010    [facility]    MELANOMA EXCISION Right    "side of my nose"   REVERSE SHOULDER ARTHROPLASTY Right 03/31/2021   Procedure: REVERSE SHOULDER ARTHROPLASTY;  Surgeon: Francena Hanly, MD;  [facility];  Service: Orthopedics;  Laterality: Right;    SHOULDER SURGERY Left 1985   TOTAL SHOULDER ARTHROPLASTY Left 12/06/2012   Procedure: LEFT TOTAL SHOULDER ARTHROPLASTY VERSES A REVERSE TOTAL SHOULDER ARTHROPLASTY;  Surgeon: Verlee Rossetti, MD;  [facility];  Service: Orthopedics;  Laterality: Left;    There were no vitals filed for this visit.   Subjective Assessment - 05/16/21 1125     Subjective Missed a step and hit right elbow and forced shoulder up.  Doing okay today.   Elb0w hurt more then shoulder.    Pertinent History CAD, coronary stent, chroinic low back pain, left TSA, right hand surgery, HTN, low back surgery, prior right knee surgery, right TKA.    Patient Stated Goals Use right UE without pain.    Currently in Pain? Yes    Pain Score 1     Pain Location Shoulder    Pain Orientation Right    Pain Descriptors / Indicators Aching    Pain Onset More than a month ago                               [facility] Adult PT Treatment/Exercise - 05/16/21 0001       Exercises   Exercises Shoulder       Shoulder Exercises: Seated   Other Seated Exercises Seated UE Ranger x 4 minutes.      Shoulder Exercises: Standing   Other Standing Exercises Wall ladder x 4 minutes.      Shoulder Exercises: Pulleys   Flexion Limitations 6 minutes.      Vasopneumatic   Number Minutes Vasopneumatic  15 minutes    Vasopnuematic Location  --   Right knee.   Vasopneumatic Pressure Low   Pillow between right elbow and thorax.     Manual Therapy   Manual Therapy Passive ROM    Passive ROM In supine:  PROM to  patient's riught shoulder x 9 minutes.           Vasopneumatic to right shoulder.               PT Long Term Goals - 04/28/21 1243       PT LONG TERM GOAL #1   Title Independent with a HEP.    Time 4    Period Weeks    Status New      PT LONG TERM GOAL #2   Title Active right shoulder flexion to 125 degrees so the patient can easily reach overhead.    Time 4    Period Weeks    Status New      PT LONG TERM GOAL #3   Title Active ER to 60 degrees+ to allow for easily donning/doffing of apparel.    Time 4    Period Weeks    Status New      PT LONG TERM GOAL #4   Title Perform ADL's with pain not > 3/10.    Time 4    Period Weeks    Status New                   Plan - 05/16/21 1144     Clinical Impression Statement Patient is making excellent progress.  He states he had a doctors visit and thery were very pleased.  He states last Thurday, 05/12/21, he missed a step and fell on his right elbow.  He states he feels fine with no increased right shoulder pain.    Personal Factors and Comorbidities Other;Comorbidity 1;Comorbidity 2    Comorbidities CAD, coronary stent, chroinic low back pain, left TSA, right hand surgery, HTN, low back surgery, prior right knee surgery, right TKA.    Examination-Activity Limitations Other;Reach Overhead    Examination-Participation Restrictions Other    Stability/Clinical Decision Making Stable/Uncomplicated    Rehab Potential  Good    PT Frequency 2x / week    PT Duration 4 weeks    PT Treatment/Interventions ADLs/Self Care Home Management;Cryotherapy;Electrical Stimulation;Therapeutic activities;Therapeutic exercise;Neuromuscular re-education;Manual techniques;Patient/family education;Vasopneumatic Device    PT Next Visit Plan FOTO...Marland KitchenMarland Kitchenplease follow protocol.  Vasopneumatic with pillow between right elbow and thorax.             Patient will benefit from skilled therapeutic intervention in order to improve the following deficits and impairments:  Pain, Decreased activity tolerance, Decreased range of motion  Visit Diagnosis: Chronic right shoulder pain  Stiffness of right shoulder, not elsewhere classified     Problem List Patient Active Problem List   Diagnosis Date Noted   Pain due to unicompartmental arthroplasty of knee (HCC) 08/25/2020   Peripheral arterial disease (HCC) 11/19/2019   RBBB 01/16/2017   PUD (peptic ulcer disease) 01/16/2017   Melena 01/12/2017   Unstable angina (HCC) 01/11/2017   CAD S/P percutaneous coronary angioplasty 01/09/2017   Mixed hyperlipidemia 01/09/2017   DM type 2 causing vascular disease (HCC) 12/24/2016   Essential hypertension, benign 12/24/2016   Chest pain, rule out acute myocardial infarction 12/24/2016   ACS (acute coronary syndrome) [facility])    History of NSTEMI    Myelopathy (HCC) 06/09/2015    Charie Pinkus, [place], PT 05/16/2021, 12:02 PM  [facility] [address] [place], [place], [address] Phone: [phone]   Fax:  [phone]  Name: Matthew [name] MRN: [id number] Date of Birth: 10/22/1940

## 2021-05-18 ENCOUNTER — Ambulatory Visit: Payer: Medicare Other | Admitting: Physical Therapy

## 2021-05-18 DIAGNOSIS — M25611 Stiffness of right shoulder, not elsewhere classified: Secondary | ICD-10-CM

## 2021-05-18 DIAGNOSIS — G8929 Other chronic pain: Secondary | ICD-10-CM

## 2021-05-18 DIAGNOSIS — M25511 Pain in right shoulder: Secondary | ICD-10-CM | POA: Diagnosis not present

## 2021-05-18 NOTE — Therapy (Signed)
[facility] ?[place] ?[place], [place], [address] ?Phone: [phone]   Fax:  [phone] ? ?Physical Therapy Treatment ? ?Patient Details  ?Name: Matthew [name] ?MRN: [id number] ?Date of Birth: 03/06/1940 ?Referring Provider (PT): Bard Herbert MD ? ? ?Encounter Date: 05/18/2021 ? ? PT End of Session - 05/18/21 1327   ? ? Visit Number 5   ? Number of Visits 8   ? Date for PT Re-Evaluation 05/26/21   ? Authorization Type UHC Medicare no auth or VL   ? PT Start Time 1253   ? PT Stop Time 8127   ? PT Time Calculation (min) 46 min   ? Activity Tolerance Patient tolerated treatment well   ? Behavior During Therapy [facility] for tasks assessed/performed   ? ?  ?  ? ?  ? ? ?Past Medical History:  ?Diagnosis Date  ? Anticoagulated   ? plavix  ? Arthritis   ? Shoulder, knees, back   ? CAD in native artery cardiologist-  dr Angelena Form  ? a. CAD/NSTEMI ,  cardiac cath staged stenting-- 12-25-2016  PTCA and DES x3 to prox. and mid LAD;  12-26-2016  PCI to PLA and DES x1 to midRCA,  EF 60-65%.  ? Chronic lower back pain   ? CKD (chronic kidney disease), stage III ([place])   ? Complication of anesthesia   ? "Too much with shoulder surgery", pt. reports that he was told the at they "lost him, due to absorbing too much anesthesia".  shoulder surgery 1985  ? DDD (degenerative disc disease), lumbosacral   ? GERD (gastroesophageal reflux disease)   ? History of gastric ulcer 01/2017  ? History of kidney stones   ? History of malignant melanoma   ? right side of nose  ? History of non-ST elevation myocardial infarction (NSTEMI) 12/23/2017  ? s/p  staged cardiac cath,  s/p  PCI and DEStenting  ? Hyperlipidemia   ? Hypertension   ? IDA (iron deficiency anemia)   ? Myocardial infarction ([place])   ? x 2  ? Neuromuscular disorder ([place])   ? neuropathy  ? Nocturia   ? RBBB   ? Renal calculus, right   ? S/P drug eluting coronary stent placement 12-25-2017,  12-26-2017  ? PTCA and DES x3 to prox. and mid  LAD;  PCI to PLA and DES x1 to [facility]  ? Type 2 diabetes mellitus treated with insulin ([place])   ? followed by pcp  ? ? ?Past Surgical History:  ?Procedure Laterality Date  ? ANAL FISSURE REPAIR  X 2  ? BACK SURGERY    ? CATARACT EXTRACTION W/ INTRAOCULAR LENS  IMPLANT, BILATERAL Bilateral 2017;  2015  ? COLONOSCOPY WITH PROPOFOL N/A 06/05/2016  ? Procedure: COLONOSCOPY WITH PROPOFOL;  Surgeon: Garlan Fair, MD;  [facility];  Service: Endoscopy;  Laterality: N/A;  ? CONVERSION TO TOTAL KNEE Right 08/25/2020  ? Procedure: Revision right knee unicompartmental arthroplasty to total knee arthroplasty;  Surgeon: Gaynelle Arabian, MD;  [facility]: [facility];  Service: Orthopedics;  Laterality: Right;  ? CORONARY ANGIOGRAPHY N/A 12/26/2016  ? Procedure: CORONARY ANGIOGRAPHY;  Surgeon: Troy Sine, MD;  Location: [place] [facility];  Service: Cardiovascular;  Laterality: N/A;  ? CORONARY STENT INTERVENTION N/A 12/25/2016  ? Procedure: CORONARY STENT INTERVENTION;  Surgeon: Burnell Blanks, MD;  Location: [place] [facility];  Service: Cardiovascular;  Laterality: N/A;  ? CORONARY STENT INTERVENTION N/A 12/26/2016  ? Procedure: CORONARY STENT INTERVENTION;  Surgeon: Troy Sine, MD;  Location: [place] [facility];  Service: Cardiovascular;  Laterality: N/A;  ? CYSTOSCOPY/URETEROSCOPY/HOLMIUM LASER/STENT PLACEMENT Right 01/07/2018  ? Procedure: RIGHT URETEROSCOPY/HOLMIUM LASER/STENT PLACEMENT;  Surgeon: Lucas Mallow, MD;  Location: [facility];  Service: Urology;  Laterality: Right;  ? ESOPHAGOGASTRODUODENOSCOPY (EGD) WITH PROPOFOL N/A 01/12/2017  ? Procedure: ESOPHAGOGASTRODUODENOSCOPY (EGD) WITH PROPOFOL;  Surgeon: Wilford Corner, MD;  Location: [place];  Service: Endoscopy;  Laterality: N/A;  ? HAND TENDON SURGERY Right 10-29-2002   dr Burney Gauze '@[facility]'$   ? right index and thumb  ? KNEE ARTHROSCOPY Bilateral 2009-;2010   '@[facility]'$   ? LEFT HEART CATH AND CORONARY ANGIOGRAPHY N/A  12/25/2016  ? Procedure: LEFT HEART CATH AND CORONARY ANGIOGRAPHY;  Surgeon: Burnell Blanks, MD;  Location: [place] [facility];  Service: Cardiovascular;  Laterality: N/A;  ? LEFT HEART CATH AND CORONARY ANGIOGRAPHY N/A 10/10/2019  ? Procedure: LEFT HEART CATH AND CORONARY ANGIOGRAPHY;  Surgeon: Troy Sine, MD;  Location: [place] [facility];  Service: Cardiovascular;  Laterality: N/A;  ? LUMBAR LAMINECTOMY/DECOMPRESSION MICRODISCECTOMY N/A 06/09/2015  ? Procedure: Laminectomy - T12-L1;  Surgeon: Eustace Moore, MD;  Location: [place] [facility];  Service: Neurosurgery;  Laterality: N/A;  Laminectomy - T12-L1  ? MEDIAL PARTIAL KNEE REPLACEMENT Bilateral 2009-2010  ?  [facility]   ? MELANOMA EXCISION Right   ? "side of my nose"  ? REVERSE SHOULDER ARTHROPLASTY Right 03/31/2021  ? Procedure: REVERSE SHOULDER ARTHROPLASTY;  Surgeon: Justice Britain, MD;  [facility];  Service: Orthopedics;  Laterality: Right;  168mn  ? SHOULDER SURGERY Left 1985  ? TOTAL SHOULDER ARTHROPLASTY Left 12/06/2012  ? Procedure: LEFT TOTAL SHOULDER ARTHROPLASTY VERSES A REVERSE TOTAL SHOULDER ARTHROPLASTY;  Surgeon: SAugustin Schooling MD;  [place]  Service: Orthopedics;  Laterality: Left;  ? ? ?There were no vitals filed for this visit. ? ? Subjective Assessment - 05/18/21 1255   ? ? Subjective Shoulder feels good today.   ? Pertinent History CAD, coronary stent, chroinic low back pain, left TSA, right hand surgery, HTN, low back surgery, prior right knee surgery, right TKA.   ? Patient Stated Goals Use right UE without pain.   ? Currently in Pain? Yes   ? Pain Score 1    ? Pain Location Shoulder   ? Pain Orientation Right   ? Pain Descriptors / Indicators Aching   ? Pain Onset More than a month ago   ? ?  ?  ? ?  ? ? ? ? ? ? ? ? ? ? ? ? ? ? ? ? ? ? ? ? [place] Treatment/Exercise - 05/18/21 0001   ? ?  ? Shoulder Exercises: Standing  ? Other Standing Exercises Wall ladder x 3 minutres f/b UE ranger on wall x 3 minutes.   ?   ? Shoulder Exercises: Pulleys  ? Flexion Limitations 5 minutes.   ?  ? Vasopneumatic  ? Number Minutes Vasopneumatic  15 minutes   ? Vasopnuematic Location  --   Right shoulder with pillow between thorax and elbow.  ? Vasopneumatic Pressure Low   ?  ? Manual Therapy  ? Manual Therapy Passive ROM   ? Passive ROM In supine:  Gentle PROM to patient's right shoulder x 12 minutes.   ? ?  ?  ? ?  ? ? ? ? ? ? ? ? ? ? ? ? ? ? ? PT Long Term Goals - 04/28/21 1243   ? ?  ?  PT LONG TERM GOAL #1  ? Title Independent with a HEP.   ? Time 4   ? Period Weeks   ? Status New   ?  ? PT LONG TERM GOAL #2  ? Title Active right shoulder flexion to 125 degrees so the patient can easily reach overhead.   ? Time 4   ? Period Weeks   ? Status New   ?  ? PT LONG TERM GOAL #3  ? Title Active ER to 60 degrees+ to allow for easily donning/doffing of apparel.   ? Time 4   ? Period Weeks   ? Status New   ?  ? PT LONG TERM GOAL #4  ? Title Perform ADL?s with pain not > 3/10.   ? Time 4   ? Period Weeks   ? Status New   ? ?  ?  ? ?  ? ? ? ? ? ? ? ? Plan - 05/18/21 1325   ? ? Clinical Impression Statement Patient is making excellent progress.  Right shoulder pain-level is low.  He did great with progression to UE Ranger on wall today.   ? Personal Factors and Comorbidities Other;Comorbidity 1;Comorbidity 2   ? Comorbidities CAD, coronary stent, chroinic low back pain, left TSA, right hand surgery, HTN, low back surgery, prior right knee surgery, right TKA.   ? Examination-Activity Limitations Other;Reach Overhead   ? Examination-Participation Restrictions Other   ? Stability/Clinical Decision Making Stable/Uncomplicated   ? Rehab Potential Good   ? PT Frequency 2x / week   ? PT Duration 4 weeks   ? PT Treatment/Interventions ADLs/Self Care Home Management;Cryotherapy;Electrical Stimulation;Therapeutic activities;Therapeutic exercise;Neuromuscular re-education;Manual techniques;Patient/family education;Vasopneumatic Device   ? PT Next Visit Plan  FOTO...Marland KitchenMarland Kitchenplease follow protocol.  Vasopneumatic with pillow between right elbow and thorax.   ? Consulted and Agree with Plan of Care Patient   ? ?  ?  ? ?  ? ? ?Patient will benefit from skilled therapeuti

## 2021-05-23 ENCOUNTER — Ambulatory Visit: Payer: Medicare Other | Admitting: Physical Therapy

## 2021-05-23 DIAGNOSIS — G8929 Other chronic pain: Secondary | ICD-10-CM

## 2021-05-23 DIAGNOSIS — M25611 Stiffness of right shoulder, not elsewhere classified: Secondary | ICD-10-CM

## 2021-05-23 DIAGNOSIS — M25511 Pain in right shoulder: Secondary | ICD-10-CM | POA: Diagnosis not present

## 2021-05-23 NOTE — Patient Instructions (Signed)
[place] ?Created by [place] Shawnette Augello Apr 17th, 2023 ?View at [email or web address]-exercise-[email or web address] using code: [email or web address] ?Total 2 Page 1 of 1 ?WAND FLEXION - SUPINE ?Lying on your back and holding a wand or cane, slowly raise the ?wand towards overhead. Use your unaffected arm to assist with ?the movement. ?Repeat 10 Times Hold 10 Seconds ?Complete 1 Set Perform 4 Times a Day ?BENCH PRESS - UNWEIGHTED ?Start with both hands on the cane/rod with your elbows bent and ?the back of your upper arms resting on the mat. Straighten your ?elbows and press the cane/rod straight up toward the ceiling. ?Slowly return to the start position. ?**Place a small towel roll or pad under your elbow in the start ?position as needed to stay in a pain free range.** ?Repeat 10 Times ?Complete 3 Sets Perform 1 Times a Day ?

## 2021-05-23 NOTE — Therapy (Signed)
[place] [facility] ?[address] ?[place], [place], [address] ?Phone: [phone]   Fax:  [phone] ? ?Physical Therapy Treatment ? ?Patient Details  ?Name: Matthew [name] ?MRN: [id number] ?Date of Birth: 11-26-1940 ?Referring Provider (PT): Bard Herbert MD ? ? ?Encounter Date: 05/23/2021 ? ? PT End of Session - 05/23/21 1154   ? ? Visit Number 6   ? Number of Visits 8   ? Date for PT Re-Evaluation 05/26/21   ? Authorization Type UHC Medicare no auth or VL   ? PT Start Time 1115   ? PT Stop Time 1202   ? PT Time Calculation (min) 47 min   ? Activity Tolerance Patient tolerated treatment well   ? Behavior During Therapy [facility] for tasks assessed/performed   ? ?  ?  ? ?  ? ? ?Past Medical History:  ?Diagnosis Date  ? Anticoagulated   ? plavix  ? Arthritis   ? Shoulder, knees, back   ? CAD in native artery cardiologist-  dr Angelena Form  ? a. CAD/NSTEMI ,  cardiac cath staged stenting-- 12-25-2016  PTCA and DES x3 to prox. and mid LAD;  12-26-2016  PCI to PLA and DES x1 to midRCA,  EF 60-65%.  ? Chronic lower back pain   ? CKD (chronic kidney disease), stage III ([place])   ? Complication of anesthesia   ? "Too much with shoulder surgery", pt. reports that he was told the at they "lost him, due to absorbing too much anesthesia".  shoulder surgery 1985  ? DDD (degenerative disc disease), lumbosacral   ? GERD (gastroesophageal reflux disease)   ? History of gastric ulcer 01/2017  ? History of kidney stones   ? History of malignant melanoma   ? right side of nose  ? History of non-ST elevation myocardial infarction (NSTEMI) 12/23/2017  ? s/p  staged cardiac cath,  s/p  PCI and DEStenting  ? Hyperlipidemia   ? Hypertension   ? IDA (iron deficiency anemia)   ? Myocardial infarction ([place])   ? x 2  ? Neuromuscular disorder ([place])   ? neuropathy  ? Nocturia   ? RBBB   ? Renal calculus, right   ? S/P drug eluting coronary stent placement 12-25-2017,  12-26-2017  ? PTCA and DES x3 to prox. and mid  LAD;  PCI to PLA and DES x1 to [facility]  ? Type 2 diabetes mellitus treated with insulin ([place])   ? followed by pcp  ? ? ?Past Surgical History:  ?Procedure Laterality Date  ? ANAL FISSURE REPAIR  X 2  ? BACK SURGERY    ? CATARACT EXTRACTION W/ INTRAOCULAR LENS  IMPLANT, BILATERAL Bilateral 2017;  2015  ? COLONOSCOPY WITH PROPOFOL N/A 06/05/2016  ? Procedure: COLONOSCOPY WITH PROPOFOL;  Surgeon: Garlan Fair, MD;  [facility];  Service: Endoscopy;  Laterality: N/A;  ? CONVERSION TO TOTAL KNEE Right 08/25/2020  ? Procedure: Revision right knee unicompartmental arthroplasty to total knee arthroplasty;  Surgeon: Gaynelle Arabian, MD;  [facility];  Service: Orthopedics;  Laterality: Right;  ? CORONARY ANGIOGRAPHY N/A 12/26/2016  ? Procedure: CORONARY ANGIOGRAPHY;  Surgeon: Troy Sine, MD;  Location: [place] [facility];  Service: Cardiovascular;  Laterality: N/A;  ? CORONARY STENT INTERVENTION N/A 12/25/2016  ? Procedure: CORONARY STENT INTERVENTION;  Surgeon: Burnell Blanks, MD;  Location: [place] [facility];  Service: Cardiovascular;  Laterality: N/A;  ? CORONARY STENT INTERVENTION N/A 12/26/2016  ? Procedure: CORONARY STENT INTERVENTION;  Surgeon: Troy Sine, MD;  Location: [place] [facility];  Service: Cardiovascular;  Laterality: N/A;  ? CYSTOSCOPY/URETEROSCOPY/HOLMIUM LASER/STENT PLACEMENT Right 01/07/2018  ? Procedure: RIGHT URETEROSCOPY/HOLMIUM LASER/STENT PLACEMENT;  Surgeon: Lucas Mallow, MD;  Location: [facility];  Service: Urology;  Laterality: Right;  ? ESOPHAGOGASTRODUODENOSCOPY (EGD) WITH PROPOFOL N/A 01/12/2017  ? Procedure: ESOPHAGOGASTRODUODENOSCOPY (EGD) WITH PROPOFOL;  Surgeon: Wilford Corner, MD;  Location: [place];  Service: Endoscopy;  Laterality: N/A;  ? HAND TENDON SURGERY Right 10-29-2002   dr Burney Gauze '@[facility]'$   ? right index and thumb  ? KNEE ARTHROSCOPY Bilateral 2009-;2010   '@[facility]'$   ? LEFT HEART CATH AND CORONARY ANGIOGRAPHY N/A  12/25/2016  ? Procedure: LEFT HEART CATH AND CORONARY ANGIOGRAPHY;  Surgeon: Burnell Blanks, MD;  Location: [place] [facility];  Service: Cardiovascular;  Laterality: N/A;  ? LEFT HEART CATH AND CORONARY ANGIOGRAPHY N/A 10/10/2019  ? Procedure: LEFT HEART CATH AND CORONARY ANGIOGRAPHY;  Surgeon: Troy Sine, MD;  Location: [place] [facility];  Service: Cardiovascular;  Laterality: N/A;  ? LUMBAR LAMINECTOMY/DECOMPRESSION MICRODISCECTOMY N/A 06/09/2015  ? Procedure: Laminectomy - T12-L1;  Surgeon: Eustace Moore, MD;  Location: [place] [facility];  Service: Neurosurgery;  Laterality: N/A;  Laminectomy - T12-L1  ? MEDIAL PARTIAL KNEE REPLACEMENT Bilateral 2009-2010  ?  [facility]   ? MELANOMA EXCISION Right   ? "side of my nose"  ? REVERSE SHOULDER ARTHROPLASTY Right 03/31/2021  ? Procedure: REVERSE SHOULDER ARTHROPLASTY;  Surgeon: Justice Britain, MD;  [facility];  Service: Orthopedics;  Laterality: Right;  188mn  ? SHOULDER SURGERY Left 1985  ? TOTAL SHOULDER ARTHROPLASTY Left 12/06/2012  ? Procedure: LEFT TOTAL SHOULDER ARTHROPLASTY VERSES A REVERSE TOTAL SHOULDER ARTHROPLASTY;  Surgeon: SAugustin Schooling MD;  [facility] [facility]  Service: Orthopedics;  Laterality: Left;  ? ? ?There were no vitals filed for this visit. ? ? Subjective Assessment - 05/23/21 1154   ? ? Subjective Doing well.  Pleased.   ? Pertinent History CAD, coronary stent, chroinic low back pain, left TSA, right hand surgery, HTN, low back surgery, prior right knee surgery, right TKA.   ? Patient Stated Goals Use right UE without pain.   ? Currently in Pain? Yes   ? Pain Score 2    ? Pain Location Shoulder   ? Pain Orientation Right   ? Pain Descriptors / Indicators Aching   ? Pain Onset More than a month ago   ? ?  ?  ? ?  ? ? ? ? ? ? ? ? ? ? ? ? ? ? ? ? ? ? ? ? OWagramAdult PT Treatment/Exercise - 05/23/21 0001   ? ?  ? Shoulder Exercises: Supine  ? Other Supine Exercises Non-resisted cane bench press x 2 minutes and flexion x 2 minutes.   ?  ?  Shoulder Exercises: Standing  ? Other Standing Exercises Wall ladder x 3 minutes and UE Ranger on wall x 3 minutes f/b ball on wall x 2 minutes.   ?  ? Shoulder Exercises: Pulleys  ? Flexion Limitations 7 minutes.   ?  ? Vasopneumatic  ? Number Minutes Vasopneumatic  15 minutes   ? Vasopnuematic Location  --   Right shoulder with pillow between elbow and thorax.  ? Vasopneumatic Pressure Low   ?  ? Manual Therapy  ? Manual Therapy Passive ROM   ? Passive ROM In supine:  Gentle right shoulder PROM x 5 minutes.   ? ?  ?  ? ?  ? ? ? ? ? ? ? ? ? ? ? ? ? ? ?  PT Long Term Goals - 04/28/21 1243   ? ?  ? PT LONG TERM GOAL #1  ? Title Independent with a HEP.   ? Time 4   ? Period Weeks   ? Status New   ?  ? PT LONG TERM GOAL #2  ? Title Active right shoulder flexion to 125 degrees so the patient can easily reach overhead.   ? Time 4   ? Period Weeks   ? Status New   ?  ? PT LONG TERM GOAL #3  ? Title Active ER to 60 degrees+ to allow for easily donning/doffing of apparel.   ? Time 4   ? Period Weeks   ? Status New   ?  ? PT LONG TERM GOAL #4  ? Title Perform ADL?s with pain not > 3/10.   ? Time 4   ? Period Weeks   ? Status New   ? ?  ?  ? ?  ? ? ? ? ? ? ? ? Plan - 05/23/21 1209   ? ? Clinical Impression Statement Excellkent progression today with active-assistive ball on wall and supine cane exercises.   ? Personal Factors and Comorbidities Other;Comorbidity 1;Comorbidity 2   ? Comorbidities CAD, coronary stent, chroinic low back pain, left TSA, right hand surgery, HTN, low back surgery, prior right knee surgery, right TKA.   ? Examination-Activity Limitations Other;Reach Overhead   ? Stability/Clinical Decision Making Stable/Uncomplicated   ? Rehab Potential Good   ? PT Frequency 2x / week   ? PT Duration 4 weeks   ? PT Treatment/Interventions ADLs/Self Care Home Management;Cryotherapy;Electrical Stimulation;Therapeutic activities;Therapeutic exercise;Neuromuscular re-education;Manual techniques;Patient/family  education;Vasopneumatic Device   ? PT Next Visit Plan AA, AROM.   ? Consulted and Agree with Plan of Care Patient   ? ?  ?  ? ?  ? ? ?Patient will benefit from skilled therapeutic intervention in order to improve t

## 2021-05-25 ENCOUNTER — Ambulatory Visit: Payer: Medicare Other | Admitting: Physical Therapy

## 2021-05-25 DIAGNOSIS — M25611 Stiffness of right shoulder, not elsewhere classified: Secondary | ICD-10-CM

## 2021-05-25 DIAGNOSIS — G8929 Other chronic pain: Secondary | ICD-10-CM | POA: Diagnosis not present

## 2021-05-25 DIAGNOSIS — M25511 Pain in right shoulder: Secondary | ICD-10-CM | POA: Diagnosis not present

## 2021-05-25 NOTE — Therapy (Signed)
?[facility] ?[address] ?[place], [place], [address] ?Phone: [phone]   Fax:  [phone] ? ?Physical Therapy Treatment ? ?Patient Details  ?Name: Matthew [name] ?MRN: [id number] ?Date of Birth: 04/23/1940 ?Referring Provider (PT): Bard Herbert MD ? ? ?Encounter Date: 05/25/2021 ? ? PT End of Session - 05/25/21 1154   ? ? Visit Number 7   ? Number of Visits 8   ? Date for PT Re-Evaluation 05/26/21   ? Authorization Type UHC Medicare no auth or VL   ? PT Start Time 1115   ? PT Stop Time 5188   ? PT Time Calculation (min) 43 min   ? Activity Tolerance Patient tolerated treatment well   ? Behavior During Therapy [facility] for tasks assessed/performed   ? ?  ?  ? ?  ? ? ?Past Medical History:  ?Diagnosis Date  ? Anticoagulated   ? plavix  ? Arthritis   ? Shoulder, knees, back   ? CAD in native artery cardiologist-  dr Angelena Form  ? a. CAD/NSTEMI ,  cardiac cath staged stenting-- 12-25-2016  PTCA and DES x3 to prox. and mid LAD;  12-26-2016  PCI to PLA and DES x1 to midRCA,  EF 60-65%.  ? Chronic lower back pain   ? CKD (chronic kidney disease), stage III ([place])   ? Complication of anesthesia   ? "Too much with shoulder surgery", pt. reports that he was told the at they "lost him, due to absorbing too much anesthesia".  shoulder surgery 1985  ? DDD (degenerative disc disease), lumbosacral   ? GERD (gastroesophageal reflux disease)   ? History of gastric ulcer 01/2017  ? History of kidney stones   ? History of malignant melanoma   ? right side of nose  ? History of non-ST elevation myocardial infarction (NSTEMI) 12/23/2017  ? s/p  staged cardiac cath,  s/p  PCI and DEStenting  ? Hyperlipidemia   ? Hypertension   ? IDA (iron deficiency anemia)   ? Myocardial infarction ([place])   ? x 2  ? Neuromuscular disorder ([place])   ? neuropathy  ? Nocturia   ? RBBB   ? Renal calculus, right   ? S/P drug eluting coronary stent placement 12-25-2017,  12-26-2017  ? PTCA and DES x3 to prox. and mid  LAD;  PCI to PLA and DES x1 to [facility]  ? Type 2 diabetes mellitus treated with insulin ([place])   ? followed by pcp  ? ? ?Past Surgical History:  ?Procedure Laterality Date  ? ANAL FISSURE REPAIR  X 2  ? BACK SURGERY    ? CATARACT EXTRACTION W/ INTRAOCULAR LENS  IMPLANT, BILATERAL Bilateral 2017;  2015  ? COLONOSCOPY WITH PROPOFOL N/A 06/05/2016  ? Procedure: COLONOSCOPY WITH PROPOFOL;  Surgeon: Garlan Fair, MD;  [facility]: [facility];  Service: Endoscopy;  Laterality: N/A;  ? CONVERSION TO TOTAL KNEE Right 08/25/2020  ? Procedure: Revision right knee unicompartmental arthroplasty to total knee arthroplasty;  Surgeon: Gaynelle Arabian, MD;  Location: [facility];  Service: Orthopedics;  Laterality: Right;  ? CORONARY ANGIOGRAPHY N/A 12/26/2016  ? Procedure: CORONARY ANGIOGRAPHY;  Surgeon: Troy Sine, MD;  Location: [place] [facility];  Service: Cardiovascular;  Laterality: N/A;  ? CORONARY STENT INTERVENTION N/A 12/25/2016  ? Procedure: CORONARY STENT INTERVENTION;  Surgeon: Burnell Blanks, MD;  Location: [place] [facility];  Service: Cardiovascular;  Laterality: N/A;  ? CORONARY STENT INTERVENTION N/A 12/26/2016  ? Procedure: CORONARY STENT INTERVENTION;  Surgeon: Troy Sine, MD;  Location: [place] [facility];  Service: Cardiovascular;  Laterality: N/A;  ? CYSTOSCOPY/URETEROSCOPY/HOLMIUM LASER/STENT PLACEMENT Right 01/07/2018  ? Procedure: RIGHT URETEROSCOPY/HOLMIUM LASER/STENT PLACEMENT;  Surgeon: Lucas Mallow, MD;  Location: [facility];  Service: Urology;  Laterality: Right;  ? ESOPHAGOGASTRODUODENOSCOPY (EGD) WITH PROPOFOL N/A 01/12/2017  ? Procedure: ESOPHAGOGASTRODUODENOSCOPY (EGD) WITH PROPOFOL;  Surgeon: Wilford Corner, MD;  Location: [place];  Service: Endoscopy;  Laterality: N/A;  ? HAND TENDON SURGERY Right 10-29-2002   dr Burney Gauze '@[facility]'$   ? right index and thumb  ? KNEE ARTHROSCOPY Bilateral 2009-;2010   '@[facility]'$   ? LEFT HEART CATH AND CORONARY ANGIOGRAPHY N/A  12/25/2016  ? Procedure: LEFT HEART CATH AND CORONARY ANGIOGRAPHY;  Surgeon: Burnell Blanks, MD;  Location: [place] [facility];  Service: Cardiovascular;  Laterality: N/A;  ? LEFT HEART CATH AND CORONARY ANGIOGRAPHY N/A 10/10/2019  ? Procedure: LEFT HEART CATH AND CORONARY ANGIOGRAPHY;  Surgeon: Troy Sine, MD;  Location: [place] [facility];  Service: Cardiovascular;  Laterality: N/A;  ? LUMBAR LAMINECTOMY/DECOMPRESSION MICRODISCECTOMY N/A 06/09/2015  ? Procedure: Laminectomy - T12-L1;  Surgeon: Eustace Moore, MD;  Location: [place] [facility];  Service: Neurosurgery;  Laterality: N/A;  Laminectomy - T12-L1  ? MEDIAL PARTIAL KNEE REPLACEMENT Bilateral 2009-2010  ?  [facility]   ? MELANOMA EXCISION Right   ? "side of my nose"  ? REVERSE SHOULDER ARTHROPLASTY Right 03/31/2021  ? Procedure: REVERSE SHOULDER ARTHROPLASTY;  Surgeon: Justice Britain, MD;  [facility];  Service: Orthopedics;  Laterality: Right;  177mn  ? SHOULDER SURGERY Left 1985  ? TOTAL SHOULDER ARTHROPLASTY Left 12/06/2012  ? Procedure: LEFT TOTAL SHOULDER ARTHROPLASTY VERSES A REVERSE TOTAL SHOULDER ARTHROPLASTY;  Surgeon: SAugustin Schooling MD;  [facility] [facility]  Service: Orthopedics;  Laterality: Left;  ? ? ?There were no vitals filed for this visit. ? ? ? ? ? ? ? ? ? ? ? ? ? ? ? ? ? ? ? ? ? OOdeboltAdult PT Treatment/Exercise - 05/25/21 0001   ? ?  ? Exercises  ? Exercises Shoulder   ?  ? Shoulder Exercises: Standing  ? Other Standing Exercises Ball on mat x 3 minutes.   ?  ? Shoulder Exercises: Pulleys  ? Flexion Limitations 6 minutes.   ? Other Pulley Exercises Wall ladder x 3 minutes f/b UE Ranger on wall x 3 minutes   ?  ? Vasopneumatic  ? Number Minutes Vasopneumatic  15 minutes   ? Vasopnuematic Location  --   Right shoulder with pillow between thorax and elbow.  ? Vasopneumatic Pressure Low   ?  ? Manual Therapy  ? Passive ROM In supine:  AAROM right shoulder range of motion into flexion, punches and rhy stabs at 90 degrees (5 minutes  total) f/b PROM x 3 minutes into flexion and ER.   ? ?  ?  ? ?  ? ? ? ? ? ? ? ? ? ? ? ? ? ? ? PT Long Term Goals - 04/28/21 1243   ? ?  ? PT LONG TERM GOAL #1  ? Title Independent with a HEP.   ? Time 4   ? Period Weeks   ? Status New   ?  ? PT LONG TERM GOAL #2  ? Title Active right shoulder flexion to 125 degrees so the patient can easily reach overhead.   ? Time 4   ? Period Weeks   ? Status New   ?  ?  PT LONG TERM GOAL #3  ? Title Active ER to 60 degrees+ to allow for easily donning/doffing of apparel.   ? Time 4   ? Period Weeks   ? Status New   ?  ? PT LONG TERM GOAL #4  ? Title Perform ADL?s with pain not > 3/10.   ? Time 4   ? Period Weeks   ? Status New   ? ?  ?  ? ?  ? ? ? ? ? ? ? ? Plan - 05/25/21 1152   ? ? Clinical Impression Statement Patient doing well well per protocol.  Progressing with active-assisitve activites which is his performing without complaint.   ? Personal Factors and Comorbidities Other;Comorbidity 1;Comorbidity 2   ? Comorbidities CAD, coronary stent, chroinic low back pain, left TSA, right hand surgery, HTN, low back surgery, prior right knee surgery, right TKA.   ? Examination-Activity Limitations Other;Reach Overhead   ? Examination-Participation Restrictions Other   ? Stability/Clinical Decision Making Stable/Uncomplicated   ? Rehab Potential Good   ? PT Frequency 2x / week   ? PT Duration 4 weeks   ? PT Treatment/Interventions ADLs/Self Care Home Management;Cryotherapy;Electrical Stimulation;Therapeutic activities;Therapeutic exercise;Neuromuscular re-education;Manual techniques;Patient/family education;Vasopneumatic Device   ? PT Next Visit Plan AA, AROM.   ? Consulted and Agree with Plan of Care Patient   ? ?  ?  ? ?  ? ? ?Patient will benefit from skilled therapeutic intervention in order to improve the following deficits and impairments:  Pain, Decreased activity tolerance, Decreased range of motion ? ?Visit Diagnosis: ?Chronic right shoulder pain ? ?Stiffness of right  shoulder, not elsewhere classified ? ? ? ? ?Problem List ?Patient Active Problem List  ? Diagnosis Date Noted  ? Pain due to unicompartmental arthroplasty of knee ([place]) 08/25/2020  ? Peripheral arterial disease ([place]

## 2021-05-30 ENCOUNTER — Encounter: Payer: Self-pay | Admitting: Physical Therapy

## 2021-05-30 ENCOUNTER — Ambulatory Visit: Payer: Medicare Other | Admitting: Physical Therapy

## 2021-05-30 DIAGNOSIS — M25511 Pain in right shoulder: Secondary | ICD-10-CM | POA: Diagnosis not present

## 2021-05-30 DIAGNOSIS — G8929 Other chronic pain: Secondary | ICD-10-CM | POA: Diagnosis not present

## 2021-05-30 DIAGNOSIS — M25611 Stiffness of right shoulder, not elsewhere classified: Secondary | ICD-10-CM

## 2021-05-30 NOTE — Therapy (Signed)
[place] ?[facility] ?[address] ?[place], [place], [address] ?Phone: [phone]   Fax:  [phone] ? ?Physical Therapy Treatment ? ?Patient Details  ?Name: Matthew [name] ?MRN: [id number] ?Date of Birth: 1940-08-17 ?Referring Provider (PT): Bard Herbert MD ? ? ?Encounter Date: 05/30/2021 ? ? PT End of Session - 05/30/21 1153   ? ? Visit Number 8   ? Number of Visits 8   ? Date for PT Re-Evaluation 05/26/21   ? Authorization Type UHC Medicare no auth or VL   ? PT Start Time 1115   ? PT Stop Time 1200   ? PT Time Calculation (min) 45 min   ? Activity Tolerance Patient tolerated treatment well   ? Behavior During Therapy [facility] for tasks assessed/performed   ? ?  ?  ? ?  ? ? ?Past Medical History:  ?Diagnosis Date  ? Anticoagulated   ? plavix  ? Arthritis   ? Shoulder, knees, back   ? CAD in native artery cardiologist-  dr Angelena Form  ? a. CAD/NSTEMI ,  cardiac cath staged stenting-- 12-25-2016  PTCA and DES x3 to prox. and mid LAD;  12-26-2016  PCI to PLA and DES x1 to midRCA,  EF 60-65%.  ? Chronic lower back pain   ? CKD (chronic kidney disease), stage III ([place])   ? Complication of anesthesia   ? "Too much with shoulder surgery", pt. reports that he was told the at they "lost him, due to absorbing too much anesthesia".  shoulder surgery 1985  ? DDD (degenerative disc disease), lumbosacral   ? GERD (gastroesophageal reflux disease)   ? History of gastric ulcer 01/2017  ? History of kidney stones   ? History of malignant melanoma   ? right side of nose  ? History of non-ST elevation myocardial infarction (NSTEMI) 12/23/2017  ? s/p  staged cardiac cath,  s/p  PCI and DEStenting  ? Hyperlipidemia   ? Hypertension   ? IDA (iron deficiency anemia)   ? Myocardial infarction ([place])   ? x 2  ? Neuromuscular disorder ([place])   ? neuropathy  ? Nocturia   ? RBBB   ? Renal calculus, right   ? S/P drug eluting coronary stent placement 12-25-2017,  12-26-2017  ? PTCA and DES x3 to prox. and mid  LAD;  PCI to PLA and DES x1 to [facility]  ? Type 2 diabetes mellitus treated with insulin ([place])   ? followed by pcp  ? ? ?Past Surgical History:  ?Procedure Laterality Date  ? ANAL FISSURE REPAIR  X 2  ? BACK SURGERY    ? CATARACT EXTRACTION W/ INTRAOCULAR LENS  IMPLANT, BILATERAL Bilateral 2017;  2015  ? COLONOSCOPY WITH PROPOFOL N/A 06/05/2016  ? Procedure: COLONOSCOPY WITH PROPOFOL;  Surgeon: Garlan Fair, MD;  [facility];  Service: Endoscopy;  Laterality: N/A;  ? CONVERSION TO TOTAL KNEE Right 08/25/2020  ? Procedure: Revision right knee unicompartmental arthroplasty to total knee arthroplasty;  Surgeon: Gaynelle Arabian, MD;  [facility];  Service: Orthopedics;  Laterality: Right;  ? CORONARY ANGIOGRAPHY N/A 12/26/2016  ? Procedure: CORONARY ANGIOGRAPHY;  Surgeon: Troy Sine, MD;  Location: [place] [facility];  Service: Cardiovascular;  Laterality: N/A;  ? CORONARY STENT INTERVENTION N/A 12/25/2016  ? Procedure: CORONARY STENT INTERVENTION;  Surgeon: Burnell Blanks, MD;  Location: [place] [facility];  Service: Cardiovascular;  Laterality: N/A;  ? CORONARY STENT INTERVENTION N/A 12/26/2016  ? Procedure: CORONARY STENT INTERVENTION;  Surgeon: Troy Sine, MD;  Location: [place] [facility];  Service: Cardiovascular;  Laterality: N/A;  ? CYSTOSCOPY/URETEROSCOPY/HOLMIUM LASER/STENT PLACEMENT Right 01/07/2018  ? Procedure: RIGHT URETEROSCOPY/HOLMIUM LASER/STENT PLACEMENT;  Surgeon: Lucas Mallow, MD;  Location: [facility];  Service: Urology;  Laterality: Right;  ? ESOPHAGOGASTRODUODENOSCOPY (EGD) WITH PROPOFOL N/A 01/12/2017  ? Procedure: ESOPHAGOGASTRODUODENOSCOPY (EGD) WITH PROPOFOL;  Surgeon: Wilford Corner, MD;  Location: [place];  Service: Endoscopy;  Laterality: N/A;  ? HAND TENDON SURGERY Right 10-29-2002   dr Burney Gauze '@[facility]'$   ? right index and thumb  ? KNEE ARTHROSCOPY Bilateral 2009-;2010   '@[facility]'$   ? LEFT HEART CATH AND CORONARY ANGIOGRAPHY N/A  12/25/2016  ? Procedure: LEFT HEART CATH AND CORONARY ANGIOGRAPHY;  Surgeon: Burnell Blanks, MD;  Location: [place] [facility];  Service: Cardiovascular;  Laterality: N/A;  ? LEFT HEART CATH AND CORONARY ANGIOGRAPHY N/A 10/10/2019  ? Procedure: LEFT HEART CATH AND CORONARY ANGIOGRAPHY;  Surgeon: Troy Sine, MD;  Location: [place] [facility];  Service: Cardiovascular;  Laterality: N/A;  ? LUMBAR LAMINECTOMY/DECOMPRESSION MICRODISCECTOMY N/A 06/09/2015  ? Procedure: Laminectomy - T12-L1;  Surgeon: Eustace Moore, MD;  Location: [place] [facility];  Service: Neurosurgery;  Laterality: N/A;  Laminectomy - T12-L1  ? MEDIAL PARTIAL KNEE REPLACEMENT Bilateral 2009-2010  ?  [facility]   ? MELANOMA EXCISION Right   ? "side of my nose"  ? REVERSE SHOULDER ARTHROPLASTY Right 03/31/2021  ? Procedure: REVERSE SHOULDER ARTHROPLASTY;  Surgeon: Justice Britain, MD;  [facility];  Service: Orthopedics;  Laterality: Right;  131mn  ? SHOULDER SURGERY Left 1985  ? TOTAL SHOULDER ARTHROPLASTY Left 12/06/2012  ? Procedure: LEFT TOTAL SHOULDER ARTHROPLASTY VERSES A REVERSE TOTAL SHOULDER ARTHROPLASTY;  Surgeon: SAugustin Schooling MD;  [place] [facility]  Service: Orthopedics;  Laterality: Left;  ? ? ?There were no vitals filed for this visit. ? ? Subjective Assessment - 05/30/21 1153   ? ? Subjective No new complaints.   ? Pertinent History CAD, coronary stent, chroinic low back pain, left TSA, right hand surgery, HTN, low back surgery, prior right knee surgery, right TKA.   ? Patient Stated Goals Use right UE without pain.   ? Currently in Pain? Other (Comment)   No pain assessment provided  ? ?  ?  ? ?  ? ? ? ? ? [facility] PT Assessment - 05/30/21 0001   ? ?  ? Assessment  ? Medical Diagnosis Reveres total shoulder replacment   ? Referring Provider (PT) KBard HerbertMD   ? Onset Date/Surgical Date 03/31/21   ?  ? Precautions  ? Precaution Comments PLEASE FOLLOW PROTOCOL.   ? ?  ?  ? ?  ? ? ? ? ? ? ? ? ? ? ? ? ? ? ? ? OBaileyAdult PT  Treatment/Exercise - 05/30/21 0001   ? ?  ? Shoulder Exercises: Supine  ? Protraction AROM;Right;15 reps   ? Flexion AROM;Left;20 reps   ?  ? Shoulder Exercises: Sidelying  ? External Rotation AROM;Right;20 reps   ? Flexion AROM;Right;15 reps   ?  ? Shoulder Exercises: Standing  ? Other Standing Exercises wall slides x20 rpes   ?  ? Shoulder Exercises: Pulleys  ? Flexion 5 minutes   ?  ? Shoulder Exercises: Isometric Strengthening  ? Flexion 5X5"   ? External Rotation 5X5"   ? Internal Rotation 5X5"   ?  ? Modalities  ? Modalities Vasopneumatic   ?  ? Vasopneumatic  ?  Number Minutes Vasopneumatic  10 minutes   ? Vasopnuematic Location  Shoulder   ? Vasopneumatic Pressure Low   ? Vasopneumatic Temperature  34 degrees for edema   ?  ? Manual Therapy  ? Manual Therapy Passive ROM   ? Passive ROM PROM of R shoulder into flex, ER/IR with holds at end range   ? ?  ?  ? ?  ? ? ? ? ? ? ? ? ? ? ? ? ? ? ? PT Long Term Goals - 04/28/21 1243   ? ?  ? PT LONG TERM GOAL #1  ? Title Independent with a HEP.   ? Time 4   ? Period Weeks   ? Status New   ?  ? PT LONG TERM GOAL #2  ? Title Active right shoulder flexion to 125 degrees so the patient can easily reach overhead.   ? Time 4   ? Period Weeks   ? Status New   ?  ? PT LONG TERM GOAL #3  ? Title Active ER to 60 degrees+ to allow for easily donning/doffing of apparel.   ? Time 4   ? Period Weeks   ? Status New   ?  ? PT LONG TERM GOAL #4  ? Title Perform ADL?s with pain not > 3/10.   ? Time 4   ? Period Weeks   ? Status New   ? ?  ?  ? ?  ? ? ? ? ? ? ? ? Plan - 05/30/21 1154   ? ? Clinical Impression Statement Patient presented in clinic with no new complaints. Patient observed with weakness in antigravity in flexion but able to complete in supine better. Greater weakness with ER isometrics as well. Firm end feels and smooth arc of motion noted during PROM. Normal vasopneumatic response noted following removal of the modality.   ? Personal Factors and Comorbidities  Other;Comorbidity 1;Comorbidity 2   ? Comorbidities CAD, coronary stent, chroinic low back pain, left TSA, right hand surgery, HTN, low back surgery, prior right knee surgery, right TKA.   ? Examination-Activity Limitation

## 2021-06-01 ENCOUNTER — Ambulatory Visit: Payer: Medicare Other | Admitting: Physical Therapy

## 2021-06-01 DIAGNOSIS — M25511 Pain in right shoulder: Secondary | ICD-10-CM | POA: Diagnosis not present

## 2021-06-01 DIAGNOSIS — G8929 Other chronic pain: Secondary | ICD-10-CM | POA: Diagnosis not present

## 2021-06-01 DIAGNOSIS — M25611 Stiffness of right shoulder, not elsewhere classified: Secondary | ICD-10-CM | POA: Diagnosis not present

## 2021-06-01 NOTE — Therapy (Signed)
[facility] ?[place] ?[place], [place], [address] ?Phone: [phone]   Fax:  [phone] ? ?Physical Therapy Treatment ? ?Patient Details  ?Name: Matthew [name] ?MRN: [id number] ?Date of Birth: 1940/07/12 ?Referring Provider (PT): Bard Herbert MD ? ? ?Encounter Date: 06/01/2021 ? ? PT End of Session - 06/01/21 1218   ? ? Visit Number 9   ? Number of Visits 14   ? Date for PT Re-Evaluation 06/30/21   ? Authorization Type UHC Medicare no auth or VL   ? PT Start Time 1115   ? PT Stop Time 6834   ? PT Time Calculation (min) 43 min   ? Activity Tolerance Patient tolerated treatment well   ? Behavior During Therapy [facility] for tasks assessed/performed   ? ?  ?  ? ?  ? ? ?Past Medical History:  ?Diagnosis Date  ? Anticoagulated   ? plavix  ? Arthritis   ? Shoulder, knees, back   ? CAD in native artery cardiologist-  dr Angelena Form  ? a. CAD/NSTEMI ,  cardiac cath staged stenting-- 12-25-2016  PTCA and DES x3 to prox. and mid LAD;  12-26-2016  PCI to PLA and DES x1 to midRCA,  EF 60-65%.  ? Chronic lower back pain   ? CKD (chronic kidney disease), stage III ([place])   ? Complication of anesthesia   ? "Too much with shoulder surgery", pt. reports that he was told the at they "lost him, due to absorbing too much anesthesia".  shoulder surgery 1985  ? DDD (degenerative disc disease), lumbosacral   ? GERD (gastroesophageal reflux disease)   ? History of gastric ulcer 01/2017  ? History of kidney stones   ? History of malignant melanoma   ? right side of nose  ? History of non-ST elevation myocardial infarction (NSTEMI) 12/23/2017  ? s/p  staged cardiac cath,  s/p  PCI and DEStenting  ? Hyperlipidemia   ? Hypertension   ? IDA (iron deficiency anemia)   ? Myocardial infarction ([place])   ? x 2  ? Neuromuscular disorder ([place])   ? neuropathy  ? Nocturia   ? RBBB   ? Renal calculus, right   ? S/P drug eluting coronary stent placement 12-25-2017,  12-26-2017  ? PTCA and DES x3 to prox. and mid  LAD;  PCI to PLA and DES x1 to [facility]  ? Type 2 diabetes mellitus treated with insulin ([place])   ? followed by pcp  ? ? ?Past Surgical History:  ?Procedure Laterality Date  ? ANAL FISSURE REPAIR  X 2  ? BACK SURGERY    ? CATARACT EXTRACTION W/ INTRAOCULAR LENS  IMPLANT, BILATERAL Bilateral 2017;  2015  ? COLONOSCOPY WITH PROPOFOL N/A 06/05/2016  ? Procedure: COLONOSCOPY WITH PROPOFOL;  Surgeon: Garlan Fair, MD;  [facility]: [facility];  Service: Endoscopy;  Laterality: N/A;  ? CONVERSION TO TOTAL KNEE Right 08/25/2020  ? Procedure: Revision right knee unicompartmental arthroplasty to total knee arthroplasty;  Surgeon: Gaynelle Arabian, MD;  [facility]: [facility];  Service: Orthopedics;  Laterality: Right;  ? CORONARY ANGIOGRAPHY N/A 12/26/2016  ? Procedure: CORONARY ANGIOGRAPHY;  Surgeon: Troy Sine, MD;  Location: [place] [facility];  Service: Cardiovascular;  Laterality: N/A;  ? CORONARY STENT INTERVENTION N/A 12/25/2016  ? Procedure: CORONARY STENT INTERVENTION;  Surgeon: Burnell Blanks, MD;  Location: [place] [facility];  Service: Cardiovascular;  Laterality: N/A;  ? CORONARY STENT INTERVENTION N/A 12/26/2016  ? Procedure: CORONARY STENT INTERVENTION;  Surgeon: Troy Sine, MD;  Location: [place] [facility];  Service: Cardiovascular;  Laterality: N/A;  ? CYSTOSCOPY/URETEROSCOPY/HOLMIUM LASER/STENT PLACEMENT Right 01/07/2018  ? Procedure: RIGHT URETEROSCOPY/HOLMIUM LASER/STENT PLACEMENT;  Surgeon: Lucas Mallow, MD;  Location: [facility];  Service: Urology;  Laterality: Right;  ? ESOPHAGOGASTRODUODENOSCOPY (EGD) WITH PROPOFOL N/A 01/12/2017  ? Procedure: ESOPHAGOGASTRODUODENOSCOPY (EGD) WITH PROPOFOL;  Surgeon: Wilford Corner, MD;  Location: [place];  Service: Endoscopy;  Laterality: N/A;  ? HAND TENDON SURGERY Right 10-29-2002   dr Burney Gauze '@[facility]'$   ? right index and thumb  ? KNEE ARTHROSCOPY Bilateral 2009-;2010   '@[facility]'$   ? LEFT HEART CATH AND CORONARY ANGIOGRAPHY N/A  12/25/2016  ? Procedure: LEFT HEART CATH AND CORONARY ANGIOGRAPHY;  Surgeon: Burnell Blanks, MD;  Location: [place] [facility];  Service: Cardiovascular;  Laterality: N/A;  ? LEFT HEART CATH AND CORONARY ANGIOGRAPHY N/A 10/10/2019  ? Procedure: LEFT HEART CATH AND CORONARY ANGIOGRAPHY;  Surgeon: Troy Sine, MD;  Location: [place] [facility];  Service: Cardiovascular;  Laterality: N/A;  ? LUMBAR LAMINECTOMY/DECOMPRESSION MICRODISCECTOMY N/A 06/09/2015  ? Procedure: Laminectomy - T12-L1;  Surgeon: Eustace Moore, MD;  Location: [place] [facility];  Service: Neurosurgery;  Laterality: N/A;  Laminectomy - T12-L1  ? MEDIAL PARTIAL KNEE REPLACEMENT Bilateral 2009-2010  ?  [facility]   ? MELANOMA EXCISION Right   ? "side of my nose"  ? REVERSE SHOULDER ARTHROPLASTY Right 03/31/2021  ? Procedure: REVERSE SHOULDER ARTHROPLASTY;  Surgeon: Justice Britain, MD;  [facility];  Service: Orthopedics;  Laterality: Right;  164mn  ? SHOULDER SURGERY Left 1985  ? TOTAL SHOULDER ARTHROPLASTY Left 12/06/2012  ? Procedure: LEFT TOTAL SHOULDER ARTHROPLASTY VERSES A REVERSE TOTAL SHOULDER ARTHROPLASTY;  Surgeon: SAugustin Schooling MD;  [place]  Service: Orthopedics;  Laterality: Left;  ? ? ?There were no vitals filed for this visit. ? ? Subjective Assessment - 06/01/21 1219   ? ? Subjective Doing well and pleased with progress.   ? Pertinent History CAD, coronary stent, chroinic low back pain, left TSA, right hand surgery, HTN, low back surgery, prior right knee surgery, right TKA.   ? Patient Stated Goals Use right UE without pain.   ? Currently in Pain? Yes   ? Pain Score 2    ? Pain Orientation Right   ? Pain Descriptors / Indicators Aching;Dull   ? Pain Onset More than a month ago   ? ?  ?  ? ?  ? ? ? ? ? ? ? ? ? ? ? ? ? ? ? ? ? ? ? ? OLaskerAdult PT Treatment/Exercise - 06/01/21 0001   ? ?  ? Shoulder Exercises: Pulleys  ? Flexion --   6 minutes.  ? Other Pulley Exercises Wall ladder x 4 minutes f/b UE Ranger on wall x 4  minutes.   ?  ? Shoulder Exercises: Therapy Ball  ? Other Therapy BXcel Energyon table x 4 minutes.   ?  ? Vasopneumatic  ? Number Minutes Vasopneumatic  15 minutes   ? Vasopnuematic Location  --   RT shoulder with pillow between thorax and right elbow.  ? Vasopneumatic Pressure Low   ?  ? Manual Therapy  ? Manual Therapy Passive ROM   ? Passive ROM In supine:  Gentle right shoulder passive range of motion x 5 minutes.   ? ?  ?  ? ?  ? ? ? ? ? ? ? ? ? ? ? ? ? ? ?  PT Long Term Goals - 04/28/21 1243   ? ?  ? PT LONG TERM GOAL #1  ? Title Independent with a HEP.   ? Time 4   ? Period Weeks   ? Status New   ?  ? PT LONG TERM GOAL #2  ? Title Active right shoulder flexion to 125 degrees so the patient can easily reach overhead.   ? Time 4   ? Period Weeks   ? Status New   ?  ? PT LONG TERM GOAL #3  ? Title Active ER to 60 degrees+ to allow for easily donning/doffing of apparel.   ? Time 4   ? Period Weeks   ? Status New   ?  ? PT LONG TERM GOAL #4  ? Title Perform ADL?s with pain not > 3/10.   ? Time 4   ? Period Weeks   ? Status New   ? ?  ?  ? ?  ? ? ? ? ? ? ? ? Plan - 06/01/21 1222   ? ? Clinical Impression Statement The patient is making excellent progress per reverse total shoulder protocol.  He is pleased with his progress thus far.   ? Personal Factors and Comorbidities Other;Comorbidity 1;Comorbidity 2   ? Comorbidities CAD, coronary stent, chroinic low back pain, left TSA, right hand surgery, HTN, low back surgery, prior right knee surgery, right TKA.   ? Examination-Activity Limitations Other;Reach Overhead   ? Examination-Participation Restrictions Other   ? Stability/Clinical Decision Making Stable/Uncomplicated   ? Rehab Potential Good   ? PT Frequency 2x / week   ? PT Treatment/Interventions ADLs/Self Care Home Management;Cryotherapy;Electrical Stimulation;Therapeutic activities;Therapeutic exercise;Neuromuscular re-education;Manual techniques;Patient/family education;Vasopneumatic Device   ? PT  Next Visit Plan AA, AROM.Marland KitchenMarland Kitchenper protocol.   ? Consulted and Agree with Plan of Care Patient   ? ?  ?  ? ?  ? ? ?Patient will benefit from skilled therapeutic intervention in order to improve the following de

## 2021-06-06 ENCOUNTER — Encounter: Payer: Self-pay | Admitting: Physical Therapy

## 2021-06-06 ENCOUNTER — Ambulatory Visit: Payer: Medicare Other | Attending: Orthopedic Surgery | Admitting: Physical Therapy

## 2021-06-06 DIAGNOSIS — G8929 Other chronic pain: Secondary | ICD-10-CM | POA: Diagnosis not present

## 2021-06-06 DIAGNOSIS — M25611 Stiffness of right shoulder, not elsewhere classified: Secondary | ICD-10-CM | POA: Insufficient documentation

## 2021-06-06 DIAGNOSIS — M25511 Pain in right shoulder: Secondary | ICD-10-CM | POA: Insufficient documentation

## 2021-06-06 NOTE — Therapy (Addendum)
[facility] [address] [place], [place], [address] Phone: [phone]   Fax:  [phone]  Physical Therapy Treatment  Patient Details  Name: Matthew [name] MRN: [id number] Date of Birth: [birth year] Referring Provider (PT): Felton Clinton MD   Encounter Date: 06/06/2021   PT End of Session - 06/06/21 1117     Visit Number 10    Number of Visits 14    Date for PT Re-Evaluation 06/30/21    Authorization Type UHC Medicare no auth or VL    PT Start Time 1115    PT Stop Time 1201    PT Time Calculation (min) 46 min    Activity Tolerance Patient tolerated treatment well    Behavior During Therapy [facility] for tasks assessed/performed             Past Medical History:  Diagnosis Date   Anticoagulated    plavix   Arthritis    Shoulder, knees, back    CAD in native artery cardiologist-  dr Clifton James   a. CAD/NSTEMI ,  cardiac cath staged stenting-- 12-25-2016  PTCA and DES x3 to prox. and mid LAD;  12-26-2016  PCI to PLA and DES x1 to midRCA,  EF 60-65%.   Chronic lower back pain    CKD (chronic kidney disease), stage III ([facility])    Complication of anesthesia    "Too much with shoulder surgery", pt. reports that he was told the at they "lost him, due to absorbing too much anesthesia".  shoulder surgery 1985   DDD (degenerative disc disease), lumbosacral    GERD (gastroesophageal reflux disease)    History of gastric ulcer 01/2017   History of kidney stones    History of malignant melanoma    right side of nose   History of non-ST elevation myocardial infarction (NSTEMI) 12/23/2017   s/p  staged cardiac cath,  s/p  PCI and DEStenting   Hyperlipidemia    Hypertension    IDA (iron deficiency anemia)    Myocardial infarction ([facility])    x 2   Neuromuscular disorder ([facility])    neuropathy   Nocturia    RBBB    Renal calculus, right    S/P drug eluting coronary stent placement 12-25-2017,  12-26-2017   PTCA and DES x3 to prox. and mid  LAD;  PCI to PLA and DES x1 to midRCA   Type 2 diabetes mellitus treated with insulin ([facility])    followed by pcp    Past Surgical History:  Procedure Laterality Date   ANAL FISSURE REPAIR  X 2   BACK SURGERY     CATARACT EXTRACTION W/ INTRAOCULAR LENS  IMPLANT, BILATERAL Bilateral 2017;  2015   COLONOSCOPY WITH PROPOFOL N/A 06/05/2016   Procedure: COLONOSCOPY WITH PROPOFOL;  Surgeon: Charolett Bumpers, MD;  [facility];  Service: Endoscopy;  Laterality: N/A;   CONVERSION TO TOTAL KNEE Right 08/25/2020   Procedure: Revision right knee unicompartmental arthroplasty to total knee arthroplasty;  Surgeon: Ollen Gross, MD;  [facility];  Service: Orthopedics;  Laterality: Right;   CORONARY ANGIOGRAPHY N/A 12/26/2016   Procedure: CORONARY ANGIOGRAPHY;  Surgeon: Lennette Bihari, MD;  [facility];  Service: Cardiovascular;  Laterality: N/A;   CORONARY STENT INTERVENTION N/A 12/25/2016   Procedure: CORONARY STENT INTERVENTION;  Surgeon: Kathleene Hazel, MD;  [facility];  Service: Cardiovascular;  Laterality: N/A;   CORONARY STENT INTERVENTION N/A 12/26/2016   Procedure: CORONARY STENT INTERVENTION;  Surgeon: Lennette Bihari, MD;  [facility];  Service: Cardiovascular;  Laterality: N/A;   CYSTOSCOPY/URETEROSCOPY/HOLMIUM LASER/STENT PLACEMENT Right 01/07/2018   Procedure: RIGHT URETEROSCOPY/HOLMIUM LASER/STENT PLACEMENT;  Surgeon: Crista Elliot, MD;  Location: [facility];  Service: Urology;  Laterality: Right;   ESOPHAGOGASTRODUODENOSCOPY (EGD) WITH PROPOFOL N/A 01/12/2017   Procedure: ESOPHAGOGASTRODUODENOSCOPY (EGD) WITH PROPOFOL;  Surgeon: Charlott Rakes, MD;  Location: [facility];  Service: Endoscopy;  Laterality: N/A;   HAND TENDON SURGERY Right 10-29-2002   dr Mina Marble @[facility]    right index and thumb   KNEE ARTHROSCOPY Bilateral 2009-;2010   @[facility]    LEFT HEART CATH AND CORONARY ANGIOGRAPHY N/A  12/25/2016   Procedure: LEFT HEART CATH AND CORONARY ANGIOGRAPHY;  Surgeon: Kathleene Hazel, MD;  [facility];  Service: Cardiovascular;  Laterality: N/A;   LEFT HEART CATH AND CORONARY ANGIOGRAPHY N/A 10/10/2019   Procedure: LEFT HEART CATH AND CORONARY ANGIOGRAPHY;  Surgeon: Lennette Bihari, MD;  [facility];  Service: Cardiovascular;  Laterality: N/A;   LUMBAR LAMINECTOMY/DECOMPRESSION MICRODISCECTOMY N/A 06/09/2015   Procedure: Laminectomy - T12-L1;  Surgeon: Tia Alert, MD;  [facility];  Service: Neurosurgery;  Laterality: N/A;  Laminectomy - T12-L1   MEDIAL PARTIAL KNEE REPLACEMENT Bilateral 2009-2010    [facility]    MELANOMA EXCISION Right    "side of my nose"   REVERSE SHOULDER ARTHROPLASTY Right 03/31/2021   Procedure: REVERSE SHOULDER ARTHROPLASTY;  Surgeon: Francena Hanly, MD;  [facility];  Service: Orthopedics;  Laterality: Right;    SHOULDER SURGERY Left 1985   TOTAL SHOULDER ARTHROPLASTY Left 12/06/2012   Procedure: LEFT TOTAL SHOULDER ARTHROPLASTY VERSES A REVERSE TOTAL SHOULDER ARTHROPLASTY;  Surgeon: Verlee Rossetti, MD;  [facility];  Service: Orthopedics;  Laterality: Left;    There were no vitals filed for this visit.   Subjective Assessment - 06/06/21 1116     Subjective Doing well and pleased with progress.    Pertinent History CAD, coronary stent, chroinic low back pain, left TSA, right hand surgery, HTN, low back surgery, prior right knee surgery, right TKA.    Patient Stated Goals Use right UE without pain.    Currently in Pain? No/denies                [facility] PT Assessment - 06/06/21 0001       Assessment   Medical Diagnosis Reveres total shoulder replacment    Referring Provider (PT) Felton Clinton MD    Onset Date/Surgical Date 03/31/21    Next MD Visit 06/20/2021      Precautions   Precaution Comments PLEASE FOLLOW PROTOCOL.                           [place]  Adult PT Treatment/Exercise - 06/06/21 0001       Shoulder Exercises: Seated   External Rotation AAROM;Both;20 reps    Flexion AROM;Right;20 reps   short lever and long lever   Abduction AROM;Right;20 reps   short lever     Shoulder Exercises: Standing   Other Standing Exercises wall slides x20 rpes      Shoulder Exercises: Pulleys   Flexion 5 minutes      Shoulder Exercises: Isometric Strengthening   Flexion 5X5"    External Rotation 5X5"    Internal Rotation 5X5"      Modalities   Modalities Vasopneumatic      Vasopneumatic  Number Minutes Vasopneumatic  10 minutes    Vasopnuematic Location  Shoulder    Vasopneumatic Pressure Low    Vasopneumatic Temperature  34 degrees for edema      Manual Therapy   Manual Therapy Passive ROM    Passive ROM PROM of R shoulder flexion, ER/IR with gentle holds at end range                          PT Long Term Goals - 04/28/21 1243       PT LONG TERM GOAL #1   Title Independent with a HEP.    Time 4    Period Weeks    Status New      PT LONG TERM GOAL #2   Title Active right shoulder flexion to 125 degrees so the patient can easily reach overhead.    Time 4    Period Weeks    Status New      PT LONG TERM GOAL #3   Title Active ER to 60 degrees+ to allow for easily donning/doffing of apparel.    Time 4    Period Weeks    Status New      PT LONG TERM GOAL #4   Title Perform ADL's with pain not > 3/10.    Time 4    Period Weeks    Status New                   Plan - 06/06/21 1213     Clinical Impression Statement Patient presented in clinic with no complaints but functional ROM observed. Short lever shoulder flexion was comfortable to approximately 90 deg but as muscle fatigue set in, long lever shoulder flexion in sitting was limited. Firm end feels and smooth arc of motion noted during PROM. Normal vasopneumatic response noted following removal of the modality.    Personal Factors and  Comorbidities Other;Comorbidity 1;Comorbidity 2    Comorbidities CAD, coronary stent, chroinic low back pain, left TSA, right hand surgery, HTN, low back surgery, prior right knee surgery, right TKA.    Examination-Activity Limitations Other;Reach Overhead    Examination-Participation Restrictions Other    Stability/Clinical Decision Making Stable/Uncomplicated    Rehab Potential Good    PT Frequency 2x / week    PT Duration 4 weeks    PT Treatment/Interventions ADLs/Self Care Home Management;Cryotherapy;Electrical Stimulation;Therapeutic activities;Therapeutic exercise;Neuromuscular re-education;Manual techniques;Patient/family education;Vasopneumatic Device    PT Next Visit Plan AA, AROM.Marland KitchenMarland Kitchenper protocol.    Consulted and Agree with Plan of Care Patient             Patient will benefit from skilled therapeutic intervention in order to improve the following deficits and impairments:  Pain, Decreased activity tolerance, Decreased range of motion  Visit Diagnosis: Chronic right shoulder pain  Stiffness of right shoulder, not elsewhere classified     Problem List Patient Active Problem List   Diagnosis Date Noted   Pain due to unicompartmental arthroplasty of knee (HCC) 08/25/2020   Peripheral arterial disease (HCC) 11/19/2019   RBBB 01/16/2017   PUD (peptic ulcer disease) 01/16/2017   Melena 01/12/2017   Unstable angina (HCC) 01/11/2017   CAD S/P percutaneous coronary angioplasty 01/09/2017   Mixed hyperlipidemia 01/09/2017   DM type 2 causing vascular disease (HCC) 12/24/2016   Essential hypertension, benign 12/24/2016   Chest pain, rule out acute myocardial infarction 12/24/2016   ACS (acute coronary syndrome) (HCC)    History of NSTEMI  Myelopathy (HCC) 06/09/2015    Marvell Fuller, PTA 06/06/2021, 12:20 PM  [facility] [address] [place], [place], [address] Phone: [phone]   Fax:  [phone]  Name: DRISTEN BUDZYNSKI MRN: [id number] Date of Birth: 1940-05-15   Progress Note Reporting Period 04/28/21 to 06/06/21  See note below for Objective Data and Assessment of Progress/Goals. Excellent progress toward goals per protocol.    [place] MPT

## 2021-06-08 ENCOUNTER — Encounter: Payer: Self-pay | Admitting: Physical Therapy

## 2021-06-08 ENCOUNTER — Ambulatory Visit: Payer: Medicare Other | Admitting: Physical Therapy

## 2021-06-08 DIAGNOSIS — G8929 Other chronic pain: Secondary | ICD-10-CM | POA: Diagnosis not present

## 2021-06-08 DIAGNOSIS — M25611 Stiffness of right shoulder, not elsewhere classified: Secondary | ICD-10-CM | POA: Diagnosis not present

## 2021-06-08 DIAGNOSIS — M25511 Pain in right shoulder: Secondary | ICD-10-CM | POA: Diagnosis not present

## 2021-06-08 NOTE — Therapy (Signed)
[place] ?[facility] ?[address] ?[place], [place], [address] ?Phone: [phone]   Fax:  [phone] ? ?Physical Therapy Treatment ? ?Patient Details  ?Name: Matthew [name] ?MRN: [id number] ?Date of Birth: 10-01-1940 ?Referring Provider (PT): Bard Herbert MD ? ? ?Encounter Date: 06/08/2021 ? ? PT End of Session - 06/08/21 1119   ? ? Visit Number 11   ? Number of Visits 14   ? Date for PT Re-Evaluation 06/30/21   ? Authorization Type UHC Medicare no auth or VL   ? PT Start Time 1116   ? PT Stop Time 1206   ? PT Time Calculation (min) 50 min   ? Activity Tolerance Patient tolerated treatment well   ? Behavior During Therapy [facility] for tasks assessed/performed   ? ?  ?  ? ?  ? ? ?Past Medical History:  ?Diagnosis Date  ? Anticoagulated   ? plavix  ? Arthritis   ? Shoulder, knees, back   ? CAD in native artery cardiologist-  dr Angelena Form  ? a. CAD/NSTEMI ,  cardiac cath staged stenting-- 12-25-2016  PTCA and DES x3 to prox. and mid LAD;  12-26-2016  PCI to PLA and DES x1 to midRCA,  EF 60-65%.  ? Chronic lower back pain   ? CKD (chronic kidney disease), stage III ([place])   ? Complication of anesthesia   ? "Too much with shoulder surgery", pt. reports that he was told the at they "lost him, due to absorbing too much anesthesia".  shoulder surgery 1985  ? DDD (degenerative disc disease), lumbosacral   ? GERD (gastroesophageal reflux disease)   ? History of gastric ulcer 01/2017  ? History of kidney stones   ? History of malignant melanoma   ? right side of nose  ? History of non-ST elevation myocardial infarction (NSTEMI) 12/23/2017  ? s/p  staged cardiac cath,  s/p  PCI and DEStenting  ? Hyperlipidemia   ? Hypertension   ? IDA (iron deficiency anemia)   ? Myocardial infarction ([place])   ? x 2  ? Neuromuscular disorder ([place])   ? neuropathy  ? Nocturia   ? RBBB   ? Renal calculus, right   ? S/P drug eluting coronary stent placement 12-25-2017,  12-26-2017  ? PTCA and DES x3 to prox. and mid  LAD;  PCI to PLA and DES [facility] to [facility]  ? Type 2 diabetes mellitus treated with insulin ([place])   ? followed by pcp  ? ? ?Past Surgical History:  ?Procedure Laterality Date  ? ANAL FISSURE REPAIR  X 2  ? BACK SURGERY    ? CATARACT EXTRACTION W/ INTRAOCULAR LENS  IMPLANT, BILATERAL Bilateral 2017;  2015  ? COLONOSCOPY WITH PROPOFOL N/A 06/05/2016  ? Procedure: COLONOSCOPY WITH PROPOFOL;  Surgeon: Garlan Fair, MD;  [facility];  Service: Endoscopy;  Laterality: N/A;  ? CONVERSION TO TOTAL KNEE Right 08/25/2020  ? Procedure: Revision right knee unicompartmental arthroplasty to total knee arthroplasty;  Surgeon: Gaynelle Arabian, MD;  [facility]: [facility];  Service: Orthopedics;  Laterality: Right;  ? CORONARY ANGIOGRAPHY N/A 12/26/2016  ? Procedure: CORONARY ANGIOGRAPHY;  Surgeon: Troy Sine, MD;  Location: [place] [facility];  Service: Cardiovascular;  Laterality: N/A;  ? CORONARY STENT INTERVENTION N/A 12/25/2016  ? Procedure: CORONARY STENT INTERVENTION;  Surgeon: Burnell Blanks, MD;  Location: [place] [facility];  Service: Cardiovascular;  Laterality: N/A;  ? CORONARY STENT INTERVENTION N/A 12/26/2016  ? Procedure: CORONARY STENT INTERVENTION;  Surgeon: Troy Sine, MD;  Location: [place] [facility];  Service: Cardiovascular;  Laterality: N/A;  ? CYSTOSCOPY/URETEROSCOPY/HOLMIUM LASER/STENT PLACEMENT Right 01/07/2018  ? Procedure: RIGHT URETEROSCOPY/HOLMIUM LASER/STENT PLACEMENT;  Surgeon: Lucas Mallow, MD;  Location: [facility];  Service: Urology;  Laterality: Right;  ? ESOPHAGOGASTRODUODENOSCOPY (EGD) WITH PROPOFOL N/A 01/12/2017  ? Procedure: ESOPHAGOGASTRODUODENOSCOPY (EGD) WITH PROPOFOL;  Surgeon: Wilford Corner, MD;  Location: [place];  Service: Endoscopy;  Laterality: N/A;  ? HAND TENDON SURGERY Right 10-29-2002   dr Burney Gauze '@[facility]'$   ? right index and thumb  ? KNEE ARTHROSCOPY Bilateral 2009-;2010   '@[facility]'$   ? LEFT HEART CATH AND CORONARY ANGIOGRAPHY N/A  12/25/2016  ? Procedure: LEFT HEART CATH AND CORONARY ANGIOGRAPHY;  Surgeon: Burnell Blanks, MD;  Location: [place] [facility];  Service: Cardiovascular;  Laterality: N/A;  ? LEFT HEART CATH AND CORONARY ANGIOGRAPHY N/A 10/10/2019  ? Procedure: LEFT HEART CATH AND CORONARY ANGIOGRAPHY;  Surgeon: Troy Sine, MD;  Location: [place] [facility];  Service: Cardiovascular;  Laterality: N/A;  ? LUMBAR LAMINECTOMY/DECOMPRESSION MICRODISCECTOMY N/A 06/09/2015  ? Procedure: Laminectomy - T12-L1;  Surgeon: Eustace Moore, MD;  Location: [place] [facility];  Service: Neurosurgery;  Laterality: N/A;  Laminectomy - T12-L1  ? MEDIAL PARTIAL KNEE REPLACEMENT Bilateral 2009-2010  ?  [facility]   ? MELANOMA EXCISION Right   ? "side of my nose"  ? REVERSE SHOULDER ARTHROPLASTY Right 03/31/2021  ? Procedure: REVERSE SHOULDER ARTHROPLASTY;  Surgeon: Justice Britain, MD;  [facility];  Service: Orthopedics;  Laterality: Right;  19mn  ? SHOULDER SURGERY Left 1985  ? TOTAL SHOULDER ARTHROPLASTY Left 12/06/2012  ? Procedure: LEFT TOTAL SHOULDER ARTHROPLASTY VERSES A REVERSE TOTAL SHOULDER ARTHROPLASTY;  Surgeon: SAugustin Schooling MD;  Location: [facility]  Service: Orthopedics;  Laterality: Left;  ? ? ?There were no vitals filed for this visit. ? ? Subjective Assessment - 06/08/21 1118   ? ? Subjective Reports his shoulder is doing good.   ? Pertinent History CAD, coronary stent, chroinic low back pain, left TSA, right hand surgery, HTN, low back surgery, prior right knee surgery, right TKA.   ? Patient Stated Goals Use right UE without pain.   ? Currently in Pain? No/denies   ? ?  ?  ? ?  ? ? ? ? ? [facility] PT Assessment - 06/08/21 0001   ? ?  ? Assessment  ? Medical Diagnosis Reveres total shoulder replacment   ? Referring Provider (PT) KBard HerbertMD   ? Onset Date/Surgical Date 03/31/21   ? Next MD Visit 06/20/2021   ?  ? Precautions  ? Precaution Comments PLEASE FOLLOW PROTOCOL.   ? ?  ?  ? ?  ? ? ? ? ? ? ? ? ? ? ? ? ? ? ? ? [facility]  PT Treatment/Exercise - 06/08/21 0001   ? ?  ? Shoulder Exercises: Supine  ? Flexion AROM;Right;20 reps   ?  ? Shoulder Exercises: Seated  ? Flexion AROM;Right;20 reps;Limitations   ? Flexion Limitations short lever   ?  ? Shoulder Exercises: Sidelying  ? External Rotation AROM;Right;20 reps   ? Flexion AROM;Right;20 reps   ?  ? Shoulder Exercises: Standing  ? Protraction Strengthening;Right;20 reps;Theraband   ? Theraband Level (Shoulder Protraction) Level 1 ([place])   ? External Rotation Strengthening;Right;20 reps;Theraband   ? Theraband Level (Shoulder External Rotation) Level 1 ([place]ellow)   ? Internal Rotation Strengthening;Right;20 reps;Theraband   ? Theraband Level (  Shoulder Internal Rotation) Level 1 ([place]ellow)   ? Extension Strengthening;Right;20 reps;Theraband   ? Theraband Level (Shoulder Extension) Level 1 (Yellow)   ?  ? Shoulder Exercises: Pulleys  ? Flexion 5 minutes   ?  ? Modalities  ? Modalities Vasopneumatic   ?  ? Vasopneumatic  ? Number Minutes Vasopneumatic  10 minutes   ? Vasopnuematic Location  Shoulder   ? Vasopneumatic Pressure Low   ? Vasopneumatic Temperature  34 degrees for edema   ?  ? Manual Therapy  ? Manual Therapy Passive ROM   ? Passive ROM PROM of R shoulder flexion, ER/IR with gentle holds at end range   ? ?  ?  ? ?  ? ? ? ? ? ? ? ? ? ? ? ? ? ? ? PT Long Term Goals - 06/08/21 1202   ? ?  ? PT LONG TERM GOAL #1  ? Title Independent with a HEP.   ? Time 4   ? Period Weeks   ? Status On-going   ?  ? PT LONG TERM GOAL #2  ? Title Active right shoulder flexion to 125 degrees so the patient can easily reach overhead.   ? Time 4   ? Period Weeks   ? Status On-going   ?  ? PT LONG TERM GOAL #3  ? Title Active ER to 60 degrees+ to allow for easily donning/doffing of apparel.   ? Time 4   ? Period Weeks   ? Status Achieved   ?  ? PT LONG TERM GOAL #4  ? Title Perform ADL?s with pain not > 3/10.   ? Time 4   ? Period Weeks   ? Status Achieved   ? ?  ?  ? ?  ? ? ? ? ? ? ? ? Plan - 06/08/21 1203    ? ? Clinical Impression Statement Patient presented in clinic with reports of only discomfort at night. Patient progressed to light resistance via theraband and light handweights. Flexion with elbow

## 2021-06-13 ENCOUNTER — Ambulatory Visit: Payer: Medicare Other | Admitting: Physical Therapy

## 2021-06-13 ENCOUNTER — Encounter: Payer: Self-pay | Admitting: Physical Therapy

## 2021-06-13 DIAGNOSIS — M25511 Pain in right shoulder: Secondary | ICD-10-CM | POA: Diagnosis not present

## 2021-06-13 DIAGNOSIS — G8929 Other chronic pain: Secondary | ICD-10-CM

## 2021-06-13 DIAGNOSIS — M25611 Stiffness of right shoulder, not elsewhere classified: Secondary | ICD-10-CM | POA: Diagnosis not present

## 2021-06-13 NOTE — Therapy (Signed)
[place] ?[facility] ?[place] [address] [place] ?[place], [place], [address] ?Phone: [phone]   Fax:  [phone] ? ?Physical Therapy Treatment ? ?Patient Details  ?Name: Matthew [name] ?MRN: [id number] ?Date of Birth: 04/18/1940 ?Referring Provider (PT): Bard Herbert MD ? ? ?Encounter Date: 06/13/2021 ? ? PT End of Session - 06/13/21 1119   ? ? Visit Number 12   ? Number of Visits 14   ? Date for PT Re-Evaluation 06/30/21   ? Authorization Type UHC Medicare no auth or VL   ? PT Start Time 1115   ? PT Stop Time 1201   ? PT Time Calculation (min) 46 min   ? Activity Tolerance Patient tolerated treatment well   ? Behavior During Therapy [facility] for tasks assessed/performed   ? ?  ?  ? ?  ? ? ?Past Medical History:  ?Diagnosis Date  ? Anticoagulated   ? plavix  ? Arthritis   ? Shoulder, knees, back   ? CAD in native artery cardiologist-  dr Angelena Form  ? a. CAD/NSTEMI ,  cardiac cath staged stenting-- 12-25-2016  PTCA and DES x3 to prox. and mid LAD;  12-26-2016  PCI to PLA and DES x1 to midRCA,  EF 60-65%.  ? Chronic lower back pain   ? CKD (chronic kidney disease), stage III ([place])   ? Complication of anesthesia   ? "Too much with shoulder surgery", pt. reports that he was told the at they "lost him, due to absorbing too much anesthesia".  shoulder surgery 1985  ? DDD (degenerative disc disease), lumbosacral   ? GERD (gastroesophageal reflux disease)   ? History of gastric ulcer 01/2017  ? History of kidney stones   ? History of malignant melanoma   ? right side of nose  ? History of non-ST elevation myocardial infarction (NSTEMI) 12/23/2017  ? s/p  staged cardiac cath,  s/p  PCI and DEStenting  ? Hyperlipidemia   ? Hypertension   ? IDA (iron deficiency anemia)   ? Myocardial infarction ([place])   ? x 2  ? Neuromuscular disorder ([place])   ? neuropathy  ? Nocturia   ? RBBB   ? Renal calculus, right   ? S/P drug eluting coronary stent placement 12-25-2017,  12-26-2017  ? PTCA and DES x3 to prox. and mid  LAD;  PCI to PLA and [facility] to [facility]  ? Type 2 diabetes mellitus treated with insulin ([place])   ? followed by pcp  ? ? ?Past Surgical History:  ?Procedure Laterality Date  ? ANAL FISSURE REPAIR  X 2  ? BACK SURGERY    ? CATARACT EXTRACTION W/ INTRAOCULAR LENS  IMPLANT, BILATERAL Bilateral 2017;  2015  ? COLONOSCOPY WITH PROPOFOL N/A 06/05/2016  ? Procedure: COLONOSCOPY WITH PROPOFOL;  Surgeon: Garlan Fair, MD;  [facility];  Service: Endoscopy;  Laterality: N/A;  ? CONVERSION TO TOTAL KNEE Right 08/25/2020  ? Procedure: Revision right knee unicompartmental arthroplasty to total knee arthroplasty;  Surgeon: Gaynelle Arabian, MD;  [facility];  Service: Orthopedics;  Laterality: Right;  ? CORONARY ANGIOGRAPHY N/A 12/26/2016  ? Procedure: CORONARY ANGIOGRAPHY;  Surgeon: Troy Sine, MD;  Location: [place] [facility];  Service: Cardiovascular;  Laterality: N/A;  ? CORONARY STENT INTERVENTION N/A 12/25/2016  ? Procedure: CORONARY STENT INTERVENTION;  Surgeon: Burnell Blanks, MD;  Location: [place] [facility];  Service: Cardiovascular;  Laterality: N/A;  ? CORONARY STENT INTERVENTION N/A 12/26/2016  ? Procedure: CORONARY STENT INTERVENTION;  Surgeon: Troy Sine, MD;  Location: [place] [facility];  Service: Cardiovascular;  Laterality: N/A;  ? CYSTOSCOPY/URETEROSCOPY/HOLMIUM LASER/STENT PLACEMENT Right 01/07/2018  ? Procedure: RIGHT URETEROSCOPY/HOLMIUM LASER/STENT PLACEMENT;  Surgeon: Lucas Mallow, MD;  [facility];  Service: Urology;  Laterality: Right;  ? ESOPHAGOGASTRODUODENOSCOPY (EGD) WITH PROPOFOL N/A 01/12/2017  ? Procedure: ESOPHAGOGASTRODUODENOSCOPY (EGD) WITH PROPOFOL;  Surgeon: Wilford Corner, MD;  Location: [place];  Service: Endoscopy;  Laterality: N/A;  ? HAND TENDON SURGERY Right 10-29-2002   dr Burney Gauze '@[facility]'$   ? right index and thumb  ? KNEE ARTHROSCOPY Bilateral 2009-;2010   '@[facility]   ? LEFT HEART CATH AND CORONARY ANGIOGRAPHY N/A  12/25/2016  ? Procedure: LEFT HEART CATH AND CORONARY ANGIOGRAPHY;  Surgeon: Burnell Blanks, MD;  Location: [place] [facility];  Service: Cardiovascular;  Laterality: N/A;  ? LEFT HEART CATH AND CORONARY ANGIOGRAPHY N/A 10/10/2019  ? Procedure: LEFT HEART CATH AND CORONARY ANGIOGRAPHY;  Surgeon: Troy Sine, MD;  Location: [place] [facility];  Service: Cardiovascular;  Laterality: N/A;  ? LUMBAR LAMINECTOMY/DECOMPRESSION MICRODISCECTOMY N/A 06/09/2015  ? Procedure: Laminectomy - T12-L1;  Surgeon: Eustace Moore, MD;  Location: [place] [facility];  Service: Neurosurgery;  Laterality: N/A;  Laminectomy - T12-L1  ? MEDIAL PARTIAL KNEE REPLACEMENT Bilateral 2009-2010  ?  [facility]   ? MELANOMA EXCISION Right   ? "side of my nose"  ? REVERSE SHOULDER ARTHROPLASTY Right 03/31/2021  ? Procedure: REVERSE SHOULDER ARTHROPLASTY;  Surgeon: Justice Britain, MD;  [facility];  Service: Orthopedics;  Laterality: Right;  143mn  ? SHOULDER SURGERY Left 1985  ? TOTAL SHOULDER ARTHROPLASTY Left 12/06/2012  ? Procedure: LEFT TOTAL SHOULDER ARTHROPLASTY VERSES A REVERSE TOTAL SHOULDER ARTHROPLASTY;  Surgeon: SAugustin Schooling MD;  Location[facility]  Service: Orthopedics;  Laterality: Left;  ? ? ?There were no vitals filed for this visit. ? ? Subjective Assessment - 06/13/21 1118   ? ? Subjective Reports his shoulder is doing good.   ? Pertinent History CAD, coronary stent, chroinic low back pain, left TSA, right hand surgery, HTN, low back surgery, prior right knee surgery, right TKA.   ? Patient Stated Goals Use right UE without pain.   ? Currently in Pain? No/denies   ? ?  ?  ? ?  ? ? ? ? ? [facility] PT Assessment - 06/13/21 0001   ? ?  ? Assessment  ? Medical Diagnosis Reveres total shoulder replacment   ? Referring Provider (PT) KBard HerbertMD   ? Onset Date/Surgical Date 03/31/21   ? Next MD Visit 06/20/2021   ?  ? Precautions  ? Precaution Comments PLEASE FOLLOW PROTOCOL.   ? ?  ?  ? ?  ? ? ? ? ? ? ? ? ? ? ? ? ? ? ? ? [facility]  PT Treatment/Exercise - 06/13/21 0001   ? ?  ? Shoulder Exercises: Supine  ? Protraction Strengthening;Right;20 reps;Weights   ? Protraction Weight (lbs) 2   ? Flexion Strengthening;Right;20 reps;Weights   ? Shoulder Flexion Weight (lbs) 2   ?  ? Shoulder Exercises: Sidelying  ? External Rotation Strengthening;Right;20 reps;Weights   ? External Rotation Weight (lbs) 2   ?  ? Shoulder Exercises: Standing  ? Protraction Strengthening;Right;20 reps;Theraband   ? Theraband Level (Shoulder Protraction) Level 1 ([place])   ? External Rotation Strengthening;Right;20 reps;Theraband   ? Theraband Level (Shoulder External Rotation) Level 1 ([place])   ? Internal Rotation Strengthening;Right;20 reps;Theraband   ?  Theraband Level (Shoulder Internal Rotation) Level 1 ([place])   ? Extension Strengthening;Right;20 reps;Theraband   ? Theraband Level (Shoulder Extension) Level 1 ([place])   ? Other Standing Exercises RUE wall clocks x10 reps   ?  ? Shoulder Exercises: Pulleys  ? Flexion 5 minutes   ?  ? Shoulder Exercises: ROM/Strengthening  ? Wall Wash wall wash CWand CCW xffatigue   ?  ? Modalities  ? Modalities Vasopneumatic   ?  ? Vasopneumatic  ? Number Minutes Vasopneumatic  10 minutes   ? Vasopnuematic Location  Shoulder   ? Vasopneumatic Pressure Low   ? Vasopneumatic Temperature  34 degrees for edema   ? ?  ?  ? ?  ? ? ? ? ? ? ? ? ? ? ? ? ? ? ? PT Long Term Goals - 06/08/21 1202   ? ?  ? PT LONG TERM GOAL #1  ? Title Independent with a HEP.   ? Time 4   ? Period Weeks   ? Status On-going   ?  ? PT LONG TERM GOAL #2  ? Title Active right shoulder flexion to 125 degrees so the patient can easily reach overhead.   ? Time 4   ? Period Weeks   ? Status On-going   ?  ? PT LONG TERM GOAL #3  ? Title Active ER to 60 degrees+ to allow for easily donning/doffing of apparel.   ? Time 4   ? Period Weeks   ? Status Achieved   ?  ? PT LONG TERM GOAL #4  ? Title Perform ADL?s with pain not > 3/10.   ? Time 4   ? Period Weeks   ? Status  Achieved   ? ?  ?  ? ?  ? ? ? ? ? ? ? ? Plan - 06/13/21 1156   ? ? Clinical Impression Statement Patient presented in clinic with reports of no pain but progressed to more functional exercises for reaching

## 2021-06-14 DIAGNOSIS — I739 Peripheral vascular disease, unspecified: Secondary | ICD-10-CM | POA: Diagnosis not present

## 2021-06-14 DIAGNOSIS — N183 Chronic kidney disease, stage 3 unspecified: Secondary | ICD-10-CM | POA: Diagnosis not present

## 2021-06-14 DIAGNOSIS — E1122 Type 2 diabetes mellitus with diabetic chronic kidney disease: Secondary | ICD-10-CM | POA: Diagnosis not present

## 2021-06-14 DIAGNOSIS — I2510 Atherosclerotic heart disease of native coronary artery without angina pectoris: Secondary | ICD-10-CM | POA: Diagnosis not present

## 2021-06-14 DIAGNOSIS — M48061 Spinal stenosis, lumbar region without neurogenic claudication: Secondary | ICD-10-CM | POA: Diagnosis not present

## 2021-06-14 DIAGNOSIS — R269 Unspecified abnormalities of gait and mobility: Secondary | ICD-10-CM | POA: Diagnosis not present

## 2021-06-14 DIAGNOSIS — Z794 Long term (current) use of insulin: Secondary | ICD-10-CM | POA: Diagnosis not present

## 2021-06-14 DIAGNOSIS — E1142 Type 2 diabetes mellitus with diabetic polyneuropathy: Secondary | ICD-10-CM | POA: Diagnosis not present

## 2021-06-14 DIAGNOSIS — E7800 Pure hypercholesterolemia, unspecified: Secondary | ICD-10-CM | POA: Diagnosis not present

## 2021-06-14 DIAGNOSIS — I10 Essential (primary) hypertension: Secondary | ICD-10-CM | POA: Diagnosis not present

## 2021-06-15 ENCOUNTER — Encounter: Payer: Self-pay | Admitting: Physical Therapy

## 2021-06-15 ENCOUNTER — Ambulatory Visit: Payer: Medicare Other | Admitting: Physical Therapy

## 2021-06-15 DIAGNOSIS — G8929 Other chronic pain: Secondary | ICD-10-CM

## 2021-06-15 DIAGNOSIS — M25611 Stiffness of right shoulder, not elsewhere classified: Secondary | ICD-10-CM | POA: Diagnosis not present

## 2021-06-15 DIAGNOSIS — M25511 Pain in right shoulder: Secondary | ICD-10-CM | POA: Diagnosis not present

## 2021-06-15 NOTE — Therapy (Addendum)
[facility] [place], [place], [address] Phone: [phone]   Fax:  [phone]  Physical Therapy Treatment  Patient Details  Name: Matthew [name] MRN: [id number] Date of Birth: 11/27/1940 Referring Provider (PT): Bard Herbert MD   Encounter Date: 06/15/2021   PT End of Session - 06/15/21 1153     Visit Number 13    Number of Visits 14    Date for PT Re-Evaluation 06/30/21    Authorization Type UHC Medicare no auth or VL    PT Start Time 1115    PT Stop Time 1201    PT Time Calculation (min) 46 min    Activity Tolerance Patient tolerated treatment well    Behavior During Therapy [facility] for tasks assessed/performed             Past Medical History:  Diagnosis Date   Anticoagulated    plavix   Arthritis    Shoulder, knees, back    CAD in native artery cardiologist-  dr Angelena Form   a. CAD/NSTEMI ,  cardiac cath staged stenting-- 12-25-2016  PTCA and DES x3 to prox. and mid LAD;  12-26-2016  PCI to PLA and DES x1 to midRCA,  EF 60-65%.   Chronic lower back pain    CKD (chronic kidney disease), stage III (HCC)    Complication of anesthesia    "Too much with shoulder surgery", pt. reports that he was told the at they "lost him, due to absorbing too much anesthesia".  shoulder surgery 1985   DDD (degenerative disc disease), lumbosacral    GERD (gastroesophageal reflux disease)    History of gastric ulcer 01/2017   History of kidney stones    History of malignant melanoma    right side of nose   History of non-ST elevation myocardial infarction (NSTEMI) 12/23/2017   s/p  staged cardiac cath,  s/p  PCI and DEStenting   Hyperlipidemia    Hypertension    IDA (iron deficiency anemia)    Myocardial infarction ([place])    x 2   Neuromuscular disorder (HCC)    neuropathy   Nocturia    RBBB    Renal calculus, right    S/P drug eluting coronary stent placement 12-25-2017,  12-26-2017   PTCA and DES x3 to prox. and mid  LAD;  PCI to PLA and DES x1 to midRCA   Type 2 diabetes mellitus treated with insulin ([place])    followed by pcp    Past Surgical History:  Procedure Laterality Date   ANAL FISSURE REPAIR  X 2   BACK SURGERY     CATARACT EXTRACTION W/ INTRAOCULAR LENS  IMPLANT, BILATERAL Bilateral 2017;  2015   COLONOSCOPY WITH PROPOFOL N/A 06/05/2016   Procedure: COLONOSCOPY WITH PROPOFOL;  Surgeon: Garlan Fair, MD;  Location: [facility];  Service: Endoscopy;  Laterality: N/A;   CONVERSION TO TOTAL KNEE Right 08/25/2020   Procedure: Revision right knee unicompartmental arthroplasty to total knee arthroplasty;  Surgeon: Gaynelle Arabian, MD;  Location: [facility];  Service: Orthopedics;  Laterality: Right;   CORONARY ANGIOGRAPHY N/A 12/26/2016   Procedure: CORONARY ANGIOGRAPHY;  Surgeon: [place] Sine, MD;  Location: [place] [facility];  Service: Cardiovascular;  Laterality: N/A;   CORONARY STENT INTERVENTION N/A 12/25/2016   Procedure: CORONARY STENT INTERVENTION;  Surgeon: Burnell Blanks, MD;  Location: [place] [facility];  Service: Cardiovascular;  Laterality: N/A;   CORONARY STENT INTERVENTION N/A 12/26/2016   Procedure: CORONARY STENT INTERVENTION;  Surgeon: [place] Sine, MD;  Location: [place] [facility];  Service: Cardiovascular;  Laterality: N/A;   CYSTOSCOPY/URETEROSCOPY/HOLMIUM LASER/STENT PLACEMENT Right 01/07/2018   Procedure: RIGHT URETEROSCOPY/HOLMIUM LASER/STENT PLACEMENT;  Surgeon: Lucas Mallow, MD;  Location: [facility];  Service: Urology;  Laterality: Right;   ESOPHAGOGASTRODUODENOSCOPY (EGD) WITH PROPOFOL N/A 01/12/2017   Procedure: ESOPHAGOGASTRODUODENOSCOPY (EGD) WITH PROPOFOL;  Surgeon: Wilford Corner, MD;  Location: [facility];  Service: Endoscopy;  Laterality: N/A;   HAND TENDON SURGERY Right 10-29-2002   dr Burney Gauze _0    right index and thumb   KNEE ARTHROSCOPY Bilateral 2009-;2010   _1    LEFT HEART CATH AND CORONARY ANGIOGRAPHY N/A  12/25/2016   Procedure: LEFT HEART CATH AND CORONARY ANGIOGRAPHY;  Surgeon: Burnell Blanks, MD;  Location: [place] [facility];  Service: Cardiovascular;  Laterality: N/A;   LEFT HEART CATH AND CORONARY ANGIOGRAPHY N/A 10/10/2019   Procedure: LEFT HEART CATH AND CORONARY ANGIOGRAPHY;  Surgeon: [place] Sine, MD;  Location: [place] [facility];  Service: Cardiovascular;  Laterality: N/A;   LUMBAR LAMINECTOMY/DECOMPRESSION MICRODISCECTOMY N/A 06/09/2015   Procedure: Laminectomy - T12-L1;  Surgeon: Eustace Moore, MD;  Location: [place] [facility];  Service: Neurosurgery;  Laterality: N/A;  Laminectomy - T12-L1   MEDIAL PARTIAL KNEE REPLACEMENT Bilateral 2009-2010    [place]    MELANOMA EXCISION Right    "side of my nose"   REVERSE SHOULDER ARTHROPLASTY Right 03/31/2021   Procedure: REVERSE SHOULDER ARTHROPLASTY;  Surgeon: Justice Britain, MD;  Location: [facility];  Service: Orthopedics;  Laterality: Right;  135mn   SHOULDER SURGERY Left 1985   TOTAL SHOULDER ARTHROPLASTY Left 12/06/2012   Procedure: LEFT TOTAL SHOULDER ARTHROPLASTY VERSES A REVERSE TOTAL SHOULDER ARTHROPLASTY;  Surgeon: SAugustin Schooling MD;  Location: [facility]  Service: Orthopedics;  Laterality: Left;    There were no vitals filed for this visit.   Subjective Assessment - 06/15/21 1423     Subjective Reports his shoulder is doing good.    Pertinent History CAD, coronary stent, chroinic low back pain, left TSA, right hand surgery, HTN, low back surgery, prior right knee surgery, right TKA.    Patient Stated Goals Use right UE without pain.    Currently in Pain? No/denies                [facility]PT Assessment - 06/15/21 0001       Assessment   Medical Diagnosis Reveres total shoulder replacment    Referring Provider (PT) KBard HerbertMD    Onset Date/Surgical Date 03/31/21    Next MD Visit 06/20/2021      Precautions   Precaution Comments PLEASE FOLLOW PROTOCOL.      ROM / Strength   AROM / PROM / Strength AROM       AROM   Overall AROM  Within functional limits for tasks performed    AROM Assessment Site Shoulder    Right/Left Shoulder Right    Right Shoulder Flexion 148 Degrees   supine; 140 in sittng   Right Shoulder Internal Rotation 55 Degrees    Right Shoulder External Rotation 80 Degrees                           [facility] Adult PT Treatment/Exercise - 06/15/21 0001       Shoulder Exercises: Supine   Protraction Strengthening;Right;20 reps;10 reps;Weights    Protraction Weight (lbs) 2    Flexion Strengthening;Right;20 reps;10 reps;Weights  Shoulder Flexion Weight (lbs) 2      Shoulder Exercises: Seated   Other Seated Exercises R upper cut 2# x30 reps      Shoulder Exercises: Sidelying   External Rotation Strengthening;Right;20 reps;10 reps;Weights    External Rotation Weight (lbs) 2    Flexion Strengthening;Right;20 reps;10 reps;Weights    Flexion Weight (lbs) 2      Shoulder Exercises: Standing   Protraction Strengthening;Right;20 reps;10 reps;Theraband    Theraband Level (Shoulder Protraction) Level 2 (Red)    External Rotation Strengthening;Right;20 reps;10 reps;Theraband    Theraband Level (Shoulder External Rotation) Level 2 (Red)    Internal Rotation Strengthening;Right;20 reps;10 reps;Theraband    Theraband Level (Shoulder Internal Rotation) Level 2 (Red)    Extension Strengthening;Right;20 reps;10 reps;Theraband    Theraband Level (Shoulder Extension) Level 2 (Red)      Shoulder Exercises: Pulleys   Flexion 5 minutes      Shoulder Exercises: ROM/Strengthening   Wall Wash into flex, CW and CCW circles x20 reps      Modalities   Modalities Vasopneumatic      Vasopneumatic   Number Minutes Vasopneumatic  10 minutes    Vasopnuematic Location  Shoulder    Vasopneumatic Pressure Low    Vasopneumatic Temperature  34 degrees for edema                          PT Long Term Goals - 06/15/21 1145       PT LONG TERM GOAL #1   Title  Independent with a HEP.    Time 4    Period Weeks    Status Achieved   Patient instructed but no paper HEP from PT     PT LONG TERM GOAL #2   Title Active right shoulder flexion to 125 degrees so the patient can easily reach overhead.    Time 4    Period Weeks    Status Achieved      PT LONG TERM GOAL #3   Title Active ER to 60 degrees+ to allow for easily donning/doffing of apparel.    Time 4    Period Weeks    Status Achieved      PT LONG TERM GOAL #4   Title Perform ADL's with pain not > 3/10.    Time 4    Period Weeks    Status Achieved                   Plan - 06/15/21 1424     Clinical Impression Statement Patient presented in clinic with no complaints prior to or during therex. Patient is able to demonstrate functional ROM and strengthening with intermittant rest breaks required. Patient unable to use resistance for seated flexion due to weakness but able to complete supine and SL with resistance. Patient able to achieve all goals from PT. Normal vasopnuematic response noted following removal of the modality.    Personal Factors and Comorbidities Other;Comorbidity 1;Comorbidity 2    Comorbidities CAD, coronary stent, chroinic low back pain, left TSA, right hand surgery, HTN, low back surgery, prior right knee surgery, right TKA.    Examination-Activity Limitations Other;Reach Overhead    Examination-Participation Restrictions Other    Stability/Clinical Decision Making Stable/Uncomplicated    Rehab Potential Good    PT Frequency 2x / week    PT Duration 4 weeks    PT Treatment/Interventions ADLs/Self Care Home Management;Cryotherapy;Electrical Stimulation;Therapeutic activities;Therapeutic exercise;Neuromuscular re-education;Manual techniques;Patient/family education;Vasopneumatic Device  PT Next Visit Plan MD visit on 05/21/2021.    Consulted and Agree with Plan of Care Patient             Patient will benefit from skilled therapeutic intervention in  order to improve the following deficits and impairments:  Pain, Decreased activity tolerance, Decreased range of motion  Visit Diagnosis: Chronic right shoulder pain  Stiffness of right shoulder, not elsewhere classified     Problem List Patient Active Problem List   Diagnosis Date Noted   Pain due to unicompartmental arthroplasty of knee ([place]) 08/25/2020   Peripheral arterial disease ([place]) 11/19/2019   RBBB 01/16/2017   PUD (peptic ulcer disease) 01/16/2017   Melena 01/12/2017   Unstable angina ([place]) 01/11/2017   CAD S/P percutaneous coronary angioplasty 01/09/2017   Mixed hyperlipidemia 01/09/2017   DM type 2 causing vascular disease ([place]) 12/24/2016   Essential hypertension, benign 12/24/2016   Chest pain, rule out acute myocardial infarction 12/24/2016   ACS (acute coronary syndrome) [facility])    History of NSTEMI    Myelopathy ([place]) 06/09/2015    Standley Brooking, PTA 06/15/2021, 2:39 PM  [facility] [address] [place], [place], [address] Phone: [phone]   Fax:  [phone]  Name: Matthew [name] MRN: [id number] Date of Birth: 14-Aug-1940  PHYSICAL THERAPY DISCHARGE SUMMARY  Visits from Start of Care: 13.  Current functional level related to goals / functional outcomes: See above.   Remaining deficits: All goals met.   Education / Equipment: HEP.   Patient agrees to discharge. Patient goals were met. Patient is being discharged due to meeting the stated rehab goals.    Mali Applegate MPT

## 2021-06-20 DIAGNOSIS — Z4789 Encounter for other orthopedic aftercare: Secondary | ICD-10-CM | POA: Diagnosis not present

## 2021-07-20 LAB — HEMOGLOBIN A1C: Hemoglobin A1C: 7

## 2021-07-25 ENCOUNTER — Ambulatory Visit: Payer: Medicare Other | Admitting: "Endocrinology

## 2021-07-26 ENCOUNTER — Encounter: Payer: Self-pay | Admitting: "Endocrinology

## 2021-07-26 ENCOUNTER — Ambulatory Visit: Payer: Medicare Other | Admitting: "Endocrinology

## 2021-07-26 VITALS — BP 138/66 | HR 68 | Ht 68.0 in | Wt 185.4 lb

## 2021-07-26 DIAGNOSIS — I10 Essential (primary) hypertension: Secondary | ICD-10-CM

## 2021-07-26 DIAGNOSIS — E1159 Type 2 diabetes mellitus with other circulatory complications: Secondary | ICD-10-CM | POA: Diagnosis not present

## 2021-07-26 DIAGNOSIS — E782 Mixed hyperlipidemia: Secondary | ICD-10-CM | POA: Diagnosis not present

## 2021-07-26 DIAGNOSIS — I739 Peripheral vascular disease, unspecified: Secondary | ICD-10-CM

## 2021-07-26 NOTE — Patient Instructions (Signed)
                                     Advice for Weight Management  -For most of us the best way to lose weight is by diet management. Generally speaking, diet management means consuming less calories intentionally which over time brings about progressive weight loss.  This can be achieved more effectively by avoiding ultra processed carbohydrates, processed meats, unhealthy fats.    It is critically important to know your numbers: how much calorie you are consuming and how much calorie you need. More importantly, our carbohydrates sources should be unprocessed naturally occurring  complex starch food items.  It is always important to balance nutrition also by  appropriate intake of proteins (mainly plant-based), healthy fats/oils, plenty of fruits and vegetables.   -The American College of Lifestyle Medicine (ACL M) recommends nutrition derived mostly from Whole Food, Plant Predominant Sources example an apple instead of applesauce or apple pie. Eat Plenty of vegetables, Mushrooms, fruits, Legumes, Whole Grains, Nuts, seeds in lieu of processed meats, processed snacks/pastries red meat, poultry, eggs.  Use only water or unsweetened tea for hydration.  The College also recommends the need to stay away from risky substances including alcohol, smoking; obtaining 7-9 hours of restorative sleep, at least 150 minutes of moderate intensity exercise weekly, importance of healthy social connections, and being mindful of stress and seek help when it is overwhelming.    -Sticking to a routine mealtime to eat 3 meals a day and avoiding unnecessary snacks is shown to have a big role in weight control. Under normal circumstances, the only time we burn stored energy is when we are hungry, so allow  some hunger to take place- hunger means no food between appropriate meal times, only water.  It is not advisable to starve.   -It is better to avoid simple carbohydrates including:  Cakes, Sweet Desserts, Ice Cream, Soda (diet and regular), Sweet Tea, Candies, Chips, Cookies, Store Bought Juices, Alcohol in Excess of  1-2 drinks a day, Lemonade,  Artificial Sweeteners, Doughnuts, Coffee Creamers, "Sugar-free" Products, etc, etc.  This is not a complete list.....    -Consulting with certified diabetes educators is proven to provide you with the most accurate and current information on diet.  Also, you may be  interested in discussing diet options/exchanges , we can schedule a visit with Matthew [name], RDN, CDE for individualized nutrition education.  -Exercise: If you are able: 30 -60 minutes a day ,4 days a week, or 150 minutes of moderate intensity exercise weekly.    The longer the better if tolerated.  Combine stretch, strength, and aerobic activities.  If you were told in the past that you have high risk for cardiovascular diseases, or if you are currently symptomatic, you may seek evaluation by your heart doctor prior to initiating moderate to intense exercise programs.                                  Additional Care Considerations for Diabetes/Prediabetes   -Diabetes  is a chronic disease.  The most important care consideration is regular follow-up with your diabetes care provider with the goal being avoiding or delaying its complications and to take advantage of advances in medications and technology.  If appropriate actions are taken early enough, type 2 diabetes can even be   reversed.  Seek information from the right source.  - Whole Food, Plant Predominant Nutrition is highly recommended: Eat Plenty of vegetables, Mushrooms, fruits, Legumes, Whole Grains, Nuts, seeds in lieu of processed meats, processed snacks/pastries red meat, poultry, eggs as recommended by American College of  Lifestyle Medicine (ACLM).  -Type 2 diabetes is known to coexist with other important comorbidities such as high blood pressure and high cholesterol.  It is critical to control not only the  diabetes but also the high blood pressure and high cholesterol to minimize and delay the risk of complications including coronary artery disease, stroke, amputations, blindness, etc.  The good news is that this diet recommendation for type 2 diabetes is also very helpful for managing high cholesterol and high blood blood pressure.  - Studies showed that people with diabetes will benefit from a class of medications known as ACE inhibitors and statins.  Unless there are specific reasons not to be on these medications, the standard of care is to consider getting one from these groups of medications at an optimal doses.  These medications are generally considered safe and proven to help protect the heart and the kidneys.    - People with diabetes are encouraged to initiate and maintain regular follow-up with eye doctors, foot doctors, dentists , and if necessary heart and kidney doctors.     - It is highly recommended that people with diabetes quit smoking or stay away from smoking, and get yearly  flu vaccine and pneumonia vaccine at least every 5 years.  See above for additional recommendations on exercise, sleep, stress management , and healthy social connections.      

## 2021-07-26 NOTE — Progress Notes (Signed)
07/26/2021, 5:16 PM  Endocrinology follow-up note   Subjective:    Patient ID: Matthew [name], male    DOB: 01-Apr-1940.  Matthew [name] is being seen in follow up after he was seen in consultation for management of currently uncontrolled symptomatic diabetes requested by  Wenda Low, MD.   Past Medical History:  Diagnosis Date   Anticoagulated    plavix   Arthritis    Shoulder, knees, back    CAD in native artery cardiologist-  dr Angelena Form   a. CAD/NSTEMI ,  cardiac cath staged stenting-- 12-25-2016  PTCA and DES x3 to prox. and mid LAD;  12-26-2016  PCI to PLA and DES x1 to midRCA,  EF 60-65%.   Chronic lower back pain    CKD (chronic kidney disease), stage III (HCC)    Complication of anesthesia    "Too much with shoulder surgery", pt. reports that he was told the at they "lost him, due to absorbing too much anesthesia".  shoulder surgery 1985   DDD (degenerative disc disease), lumbosacral    GERD (gastroesophageal reflux disease)    History of gastric ulcer 01/2017   History of kidney stones    History of malignant melanoma    right side of nose   History of non-ST elevation myocardial infarction (NSTEMI) 12/23/2017   s/p  staged cardiac cath,  s/p  PCI and DEStenting   Hyperlipidemia    Hypertension    IDA (iron deficiency anemia)    Myocardial infarction ([place])    x 2   Neuromuscular disorder (HCC)    neuropathy   Nocturia    RBBB    Renal calculus, right    S/P drug eluting coronary stent placement 12-25-2017,  12-26-2017   PTCA and DES x3 to prox. and mid LAD;  PCI to PLA and DES x1 to midRCA   Type 2 diabetes mellitus treated with insulin ([place])    followed by pcp    Past Surgical History:  Procedure Laterality Date   ANAL FISSURE REPAIR  X 2   BACK SURGERY     CATARACT EXTRACTION W/ INTRAOCULAR LENS  IMPLANT, BILATERAL Bilateral 2017;  2015   COLONOSCOPY WITH PROPOFOL N/A 06/05/2016   Procedure: COLONOSCOPY WITH PROPOFOL;   Surgeon: Garlan Fair, MD;  Location: [facility];  Service: Endoscopy;  Laterality: N/A;   CONVERSION TO TOTAL KNEE Right 08/25/2020   Procedure: Revision right knee unicompartmental arthroplasty to total knee arthroplasty;  Surgeon: Gaynelle Arabian, MD;  Location: [facility];  Service: Orthopedics;  Laterality: Right;   CORONARY ANGIOGRAPHY N/A 12/26/2016   Procedure: CORONARY ANGIOGRAPHY;  Surgeon: Troy Sine, MD;  Location: [facility];  Service: Cardiovascular;  Laterality: N/A;   CORONARY STENT INTERVENTION N/A 12/25/2016   Procedure: CORONARY STENT INTERVENTION;  Surgeon: Burnell Blanks, MD;  Location: [facility];  Service: Cardiovascular;  Laterality: N/A;   CORONARY STENT INTERVENTION N/A 12/26/2016   Procedure: CORONARY STENT INTERVENTION;  Surgeon: Troy Sine, MD;  Location: [facility];  Service: Cardiovascular;  Laterality: N/A;   CYSTOSCOPY/URETEROSCOPY/HOLMIUM LASER/STENT PLACEMENT Right 01/07/2018   Procedure: RIGHT URETEROSCOPY/HOLMIUM LASER/STENT PLACEMENT;  Surgeon: Lucas Mallow, MD;  Location: [facility];  Service: Urology;  Laterality: Right;   ESOPHAGOGASTRODUODENOSCOPY (EGD) WITH PROPOFOL N/A 01/12/2017   Procedure: ESOPHAGOGASTRODUODENOSCOPY (EGD) WITH PROPOFOL;  Surgeon: Wilford Corner, MD;  Location: [facility];  Service: Endoscopy;  Laterality: N/A;  HAND TENDON SURGERY Right 10-29-2002   dr Burney Gauze '@[facility]'$    right index and thumb   KNEE ARTHROSCOPY Bilateral 2009-;2010   '@[facility]'$    LEFT HEART CATH AND CORONARY ANGIOGRAPHY N/A 12/25/2016   Procedure: LEFT HEART CATH AND CORONARY ANGIOGRAPHY;  Surgeon: Burnell Blanks, MD;  Location: [facility];  Service: Cardiovascular;  Laterality: N/A;   LEFT HEART CATH AND CORONARY ANGIOGRAPHY N/A 10/10/2019   Procedure: LEFT HEART CATH AND CORONARY ANGIOGRAPHY;  Surgeon: Troy Sine, MD;  Location: [facility];  Service: Cardiovascular;   Laterality: N/A;   LUMBAR LAMINECTOMY/DECOMPRESSION MICRODISCECTOMY N/A 06/09/2015   Procedure: Laminectomy - T12-L1;  Surgeon: Eustace Moore, MD;  Location: [facility];  Service: Neurosurgery;  Laterality: N/A;  Laminectomy - T12-L1   MEDIAL PARTIAL KNEE REPLACEMENT Bilateral 2009-2010    [facility]    MELANOMA EXCISION Right    "side of my nose"   REVERSE SHOULDER ARTHROPLASTY Right 03/31/2021   Procedure: REVERSE SHOULDER ARTHROPLASTY;  Surgeon: Justice Britain, MD;  Location: [facility];  Service: Orthopedics;  Laterality: Right;  142mn   SHOULDER SURGERY Left 1985   TOTAL SHOULDER ARTHROPLASTY Left 12/06/2012   Procedure: LEFT TOTAL SHOULDER ARTHROPLASTY VERSES A REVERSE TOTAL SHOULDER ARTHROPLASTY;  Surgeon: SAugustin Schooling MD;  Location: [facility]  Service: Orthopedics;  Laterality: Left;    Social History   Socioeconomic History   Marital status: Married    Spouse name: Not on file   Number of children: Not on file   Years of education: Not on file   Highest education level: Not on file  Occupational History   Not on file  Tobacco Use   Smoking status: Never   Smokeless tobacco: Never  Vaping Use   Vaping Use: Never used  Substance and Sexual Activity   Alcohol use: No   Drug use: No   Sexual activity: Not Currently  Other Topics Concern   Not on file  Social History Narrative   Not on file   Social Determinants of Health   Financial Resource Strain: Not on file  Food Insecurity: Not on file  Transportation Needs: Not on file  Physical Activity: Not on file  Stress: Not on file  Social Connections: Not on file    Family History  Problem Relation Age of Onset   CAD Father    Heart failure Father    CAD Sister    Stroke Mother    Heart failure Mother     Outpatient Encounter Medications as of 07/26/2021  Medication Sig   acetaminophen (TYLENOL) 325 MG tablet Take 2 tablets (650 mg total) by mouth every 6 (six) hours as needed for mild pain or headache.    Alogliptin Benzoate 12.5 MG TABS Take 12.5 mg by mouth daily.   amLODipine (NORVASC) 10 MG tablet Take 5 mg by mouth in the morning and at bedtime.   amoxicillin (AMOXIL) 500 MG capsule Take 2,000 mg by mouth See admin instructions. Take 2000 mg by mouth 1 hour prior to dental work   clopidogrel (PLAVIX) 75 MG tablet Take 1 tablet (75 mg total) by mouth daily.   Continuous Blood Gluc Receiver (FREESTYLE LIBRE 2 READER) DEVI As directed   Continuous Blood Gluc Sensor (FREESTYLE LIBRE 2 SENSOR) MISC 1 Piece by Does not apply route every 14 (fourteen) days.   cyclobenzaprine (FLEXERIL) 10 MG tablet Take 1 tablet (10 mg total) by mouth 3 (three) times daily as needed for muscle spasms.  empagliflozin (JARDIANCE) 10 MG TABS tablet Take 1 tablet (10 mg total) by mouth daily with breakfast.   ezetimibe (ZETIA) 10 MG tablet Take 1 tablet (10 mg total) by mouth daily.   HYDROmorphone (DILAUDID) 2 MG tablet Take 1 tablet (2 mg total) by mouth every 4 (four) hours as needed.   insulin aspart protamine- aspart (NOVOLOG MIX 70/30) (70-30) 100 UNIT/ML injection Inject 7 Units into the skin 2 (two) times daily with a meal.   iron polysaccharides (NIFEREX) 150 MG capsule Take 150 mg by mouth daily.   isosorbide mononitrate (IMDUR) 60 MG 24 hr tablet Take 1.5 tablets (90 mg total) by mouth daily. (Patient taking differently: Take 60 mg by mouth daily.)   losartan (COZAAR) 50 MG tablet Take 1 tablet (50 mg total) by mouth daily. (Patient taking differently: Take 50 mg by mouth at bedtime.)   metFORMIN (GLUCOPHAGE XR) 500 MG 24 hr tablet Take 1 tablet (500 mg total) by mouth daily with breakfast.   methocarbamol (ROBAXIN) 500 MG tablet Take 1 tablet (500 mg total) by mouth every 6 (six) hours as needed for muscle spasms. (Patient not taking: Reported on 03/15/2021)   metoprolol tartrate (LOPRESSOR) 25 MG tablet TAKE 1 TABLET BY MOUTH TWICE A DAY   nitroGLYCERIN (NITROSTAT) 0.4 MG SL tablet Place 1 tablet (0.4 mg total)  under the tongue every 5 (five) minutes as needed for chest pain.   Omega-3 Fatty Acids (FISH OIL) 1000 MG CAPS Take by mouth daily.   ondansetron (ZOFRAN) 4 MG tablet Take 1 tablet (4 mg total) by mouth every 8 (eight) hours as needed for nausea or vomiting.   pantoprazole (PROTONIX) 40 MG tablet Take 40 mg by mouth daily.   rosuvastatin (CRESTOR) 10 MG tablet Take 5 mg by mouth at bedtime.   Vitamin D, Cholecalciferol, 25 MCG (1000 UT) CAPS Take 1,000 Units by mouth daily.    Zinc 50 MG CAPS Take 50 mg by mouth daily.   No facility-administered encounter medications on file as of 07/26/2021.    ALLERGIES: Allergies  Allergen Reactions   Statins Other (See Comments)    Severe stomach pain.   Oxycodone Itching   Hydrocodone Other (See Comments)    "messes with my hearing"   Codeine Itching   Morphine And Related Itching    VACCINATION STATUS: Immunization History  Administered Date(s) Administered   Influenza-Unspecified 10/24/2012   Moderna Sars-Covid-2 Vaccination 02/07/2019, 03/10/2019    Diabetes He presents for his follow-up diabetic visit. He has type 2 diabetes mellitus. Onset time: He was diagnosed at approximate age of 78 years. His disease course has been stable. There are no hypoglycemic associated symptoms. Pertinent negatives for hypoglycemia include no confusion, headaches, pallor or seizures. Pertinent negatives for diabetes include no blurred vision, no chest pain, no fatigue, no polydipsia, no polyphagia, no polyuria and no weakness. There are no hypoglycemic complications. Symptoms are stable. Diabetic complications include heart disease and PVD. Risk factors for coronary artery disease include diabetes mellitus, dyslipidemia, male sex, obesity and sedentary lifestyle. His weight is stable. He is following a generally unhealthy diet. When asked about meal planning, he reported none. He has had a previous visit with a dietitian. He participates in exercise  intermittently. His home blood glucose trend is fluctuating minimally. His breakfast blood glucose range is generally 140-180 mg/dl. His dinner blood glucose range is generally 140-180 mg/dl. His bedtime blood glucose range is generally 140-180 mg/dl. His overall blood glucose range is 140-180 mg/dl. (Matthew [name]  presents with continued improvement in his glycemic profile.  He is CGM was reviewed over his phone.  AGP report shows 74% time range, 24% level 1 hyperglycemia, 2% level 2 hyperglycemia.  No hypoglycemia. his average blood glucose is 158.  Point-of-care A1c is 7% today.    ) An ACE inhibitor/angiotensin II receptor blocker is not being taken. Eye exam is current.  Hyperlipidemia This is a chronic problem. The current episode started more than 1 year ago. The problem is uncontrolled. Exacerbating diseases include diabetes. Associated symptoms include myalgias. Pertinent negatives include no chest pain or shortness of breath. Current antihyperlipidemic treatment includes bile acid sequestrants. Risk factors for coronary artery disease include diabetes mellitus, dyslipidemia, hypertension and male sex.     Review of Systems  Constitutional:  Negative for chills, fatigue, fever and unexpected weight change.  HENT:  Negative for dental problem, mouth sores and trouble swallowing.   Eyes:  Negative for blurred vision and visual disturbance.  Respiratory:  Negative for cough, choking, chest tightness, shortness of breath and wheezing.   Cardiovascular:  Negative for chest pain, palpitations and leg swelling.  Gastrointestinal:  Negative for abdominal distention, abdominal pain, constipation, diarrhea, nausea and vomiting.  Endocrine: Negative for polydipsia, polyphagia and polyuria.  Genitourinary:  Negative for dysuria, flank pain, hematuria and urgency.  Musculoskeletal:  Positive for myalgias. Negative for back pain, gait problem and neck pain.  Skin:  Negative for pallor, rash and wound.   Neurological:  Negative for seizures, syncope, weakness, numbness and headaches.  Psychiatric/Behavioral:  Negative for confusion and dysphoric mood.     Objective:       07/26/2021    9:50 AM 03/31/2021   10:47 AM 03/31/2021   10:30 AM  Vitals with BMI  Height '5\' 8"'$     Weight 185 lbs 6 oz    BMI 09.7    Systolic 353 299 242  Diastolic 66 60 73  Pulse 68 68 59    BP 138/66   Pulse 68   Ht '5\' 8"'$  (1.727 m)   Wt 185 lb 6.4 oz (84.1 kg)   BMI 28.19 kg/m   Wt Readings from Last 3 Encounters:  07/26/21 185 lb 6.4 oz (84.1 kg)  03/31/21 188 lb 12.8 oz (85.6 kg)  03/23/21 188 lb 12.8 oz (85.6 kg)     CMP ( most recent) CMP     Component Value Date/Time   NA 139 03/21/2021 1104   NA 141 11/11/2020 0855   K 4.8 03/21/2021 1104   CL 105 03/21/2021 1104   CO2 25 03/21/2021 1104   GLUCOSE 133 (H) 03/21/2021 1104   BUN 19 03/21/2021 1104   BUN 17 11/11/2020 0855   CREATININE 1.20 03/21/2021 1104   CALCIUM 9.4 03/21/2021 1104   PROT 6.9 11/11/2020 0855   ALBUMIN 4.4 11/11/2020 0855   AST 17 11/11/2020 0855   ALT 13 11/11/2020 0855   ALKPHOS 99 11/11/2020 0855   BILITOT 0.5 11/11/2020 0855   GFRNONAA >60 03/21/2021 1104   GFRAA 69 10/08/2019 1556     Diabetic Labs (most recent): Lab Results  Component Value Date   HGBA1C 7.0 07/20/2021   HGBA1C 6.1 (H) 03/21/2021   HGBA1C 6.4 11/17/2020   MICROALBUR 150 01/14/2020     Lipid Panel ( most recent) Lipid Panel     Component Value Date/Time   CHOL 182 11/11/2020 0855   TRIG 142 11/11/2020 0855   HDL 40 11/11/2020 0855   CHOLHDL 4.6 11/11/2020 0855  CHOLHDL 4.4 12/24/2016 0634   VLDL 13 12/24/2016 0634   LDLCALC 117 (H) 11/11/2020 0855   LABVLDL 25 11/11/2020 0855       Assessment & Plan:   1. 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  - Matthew [name] has currently uncontrolled symptomatic type 2 DM since  81 years of age.   Matthew [name] presents with continued  improvement in his glycemic profile.  He is CGM was reviewed over his phone.  AGP report shows 74% time range, 24% level 1 hyperglycemia, 2% level 2 hyperglycemia.  No hypoglycemia. his average blood glucose is 158.  Point-of-care A1c is 7% today.      Recent labs reviewed. - I had a long discussion with him about the progressive nature of diabetes and the pathology behind its complications. -his diabetes is complicated by coronary artery disease, peripheral arterial disease, and he remains at a high risk for more acute and chronic complications which include CAD, CVA, CKD, retinopathy, and neuropathy. These are all discussed in detail with him.  - I have counseled him on diet  and weight management  by adopting a carbohydrate restricted/protein rich diet. Patient is encouraged to switch to  unprocessed or minimally processed     complex starch and increased protein intake (animal or plant source), fruits, and vegetables. -  he is advised to stick to a routine mealtimes to eat 3 meals  a day and avoid unnecessary snacks ( to snack only to correct hypoglycemia).   - he acknowledges that there is a room for improvement in his food and drink choices. - Suggestion is made for him to avoid simple carbohydrates  from his diet including Cakes, Sweet Desserts, Ice Cream, Soda (diet and regular), Sweet Tea, Candies, Chips, Cookies, Store Bought Juices, Alcohol , Artificial Sweeteners,  Coffee Creamer, and "Sugar-free" Products, Lemonade. This will help patient to have more stable blood glucose profile and potentially avoid unintended weight gain.  The following Lifestyle Medicine recommendations according to [place]  [facility]) were discussed and and offered to patient and he  agrees to start the journey:  A. Whole Foods, Plant-Based Nutrition comprising of fruits and vegetables, plant-based proteins, whole-grain carbohydrates was discussed in detail with the patient.   A list for  source of those nutrients were also provided to the patient.  Patient will use only water or unsweetened tea for hydration. B.  The need to stay away from risky substances including alcohol, smoking; obtaining 7 to 9 hours of restorative sleep, at least 150 minutes of moderate intensity exercise weekly, the importance of healthy social connections,  and stress management techniques were discussed. C.  A full color page of  Calorie density of various food groups per pound showing examples of each food groups was provided to the patient.   - he will be scheduled with Jearld Fenton, RDN, CDE for diabetes education.  - I have approached him with the following individualized plan to manage  his diabetes and patient agrees:    -He is benefiting from his [place].  He was taken off of insulin during his last visit.  He was not needed at this time.  He is advised to continue Jardiance 10 mg p.o. daily at breakfast, metformin 500 mg p.o. daily at breakfast, alogliptin 12.5 mg p.o. daily at breakfast.    He is advised to continue to need to use his CGM to monitor blood glucose continuously.   - he  is encouraged to call clinic for blood glucose levels less than 70 or above 200 mg /dl.    - Specific targets for  A1c;  LDL, HDL,  and Triglycerides were discussed with the patient.  2) Blood Pressure /Hypertension:  His blood pressure is controlled to target.  He is tolerating the losartan prescribed during his last visit.  He is advised to continue losartan 50 mg p.o. daily, metoprolol 25 mg p.o. twice a day.   3) Lipids/Hyperlipidemia: Review of his recent fasting lipid panel reveals uncontrolled LDL at 117.  He was advised to continue Crestor 5 mg p.o. nightly.  He will have fasting lipid panel before his next visit.      4)  Weight/Diet:  Body mass index is 28.19 kg/m.    he is  a candidate for some weight loss. I discussed with him the fact that loss of 5 - 10% of his  current body weight will have  the most impact on his diabetes management.  Exercise, and detailed carbohydrates information provided  -  detailed on discharge instructions.    5)  Chronic Care/Health Maintenance:  -he  Is not  On ARB and not on  Statin medications and  is encouraged to initiate and continue to follow up with Ophthalmology, Dentist,  Podiatrist at least yearly or according to recommendations, and advised to   stay away from smoking. I have recommended yearly flu vaccine and pneumonia vaccine at least every 5 years; moderate intensity exercise for up to 150 minutes weekly; and  sleep for at least 7 hours a day.    - he is  advised to maintain close follow up with Wenda Low, MD for primary care needs, as well as his other providers for optimal and coordinated care.   I spent 41 minutes in the care of the patient today including review of labs from [facility], Lipids, Thyroid Function, Hematology (current and previous including abstractions from other facilities); face-to-face time discussing  his blood glucose readings/logs, discussing hypoglycemia and hyperglycemia episodes and symptoms, medications doses, his options of short and long term treatment based on the latest standards of care / guidelines;  discussion about incorporating lifestyle medicine;  and documenting the encounter.    Please refer to Patient Instructions for Blood Glucose Monitoring and Insulin/Medications Dosing Guide"  in media tab for additional information. Please  also refer to " Patient Self Inventory" in the Media  tab for reviewed elements of pertinent patient history.  Gilda Crease participated in the discussions, expressed understanding, and voiced agreement with the above plans.  All questions were answered to his satisfaction. he is encouraged to contact clinic should he have any questions or concerns prior to his return visit.    Follow up plan: - Return in about 4 months (around 11/25/2021) for Bring Meter/CGM Device/Logs-  A1c in Office.  Glade Lloyd, MD [facility] [address] [place], [place] [address] Phone: [phone]  Fax: [phone]    07/26/2021, 5:16 PM  This note was partially dictated with voice recognition software. Similar sounding words can be transcribed inadequately or may not  be corrected upon review.

## 2021-09-12 ENCOUNTER — Ambulatory Visit: Payer: Medicare Other | Admitting: Cardiovascular Disease

## 2021-09-12 ENCOUNTER — Ambulatory Visit (INDEPENDENT_AMBULATORY_CARE_PROVIDER_SITE_OTHER): Payer: Medicare Other

## 2021-09-12 ENCOUNTER — Encounter: Payer: Self-pay | Admitting: Cardiovascular Disease

## 2021-09-12 VITALS — BP 80/60 | HR 54 | Ht 68.0 in | Wt 179.0 lb

## 2021-09-12 DIAGNOSIS — I25118 Atherosclerotic heart disease of native coronary artery with other forms of angina pectoris: Secondary | ICD-10-CM

## 2021-09-12 DIAGNOSIS — R42 Dizziness and giddiness: Secondary | ICD-10-CM

## 2021-09-12 DIAGNOSIS — I10 Essential (primary) hypertension: Secondary | ICD-10-CM | POA: Diagnosis not present

## 2021-09-12 DIAGNOSIS — E785 Hyperlipidemia, unspecified: Secondary | ICD-10-CM | POA: Diagnosis not present

## 2021-09-12 MED ORDER — ASPIRIN 81 MG PO TBEC: 81.0000 mg | DELAYED_RELEASE_TABLET | Freq: Every day | ORAL | 3 refills | Status: DC

## 2021-09-12 NOTE — Patient Instructions (Addendum)
Medication Instructions:  Your physician has recommended you make the following change in your medication:  1.) stop amlodipine (Norvasc) 2.) stop clopidogrel (Plavix) 3.) start aspirin 81 mg - take one tablet daily  *If you need a refill on your cardiac medications before your next appointment, please call your pharmacy*   Lab Work: none   Testing/Procedures: Your physician has requested that you have an echocardiogram. Echocardiography is a painless test that uses sound waves to create images of your heart. It provides your doctor with information about the size and shape of your heart and how well your heart's chambers and valves are working. This procedure takes approximately one hour. There are no restrictions for this procedure.  Zio XT 3 day heart monitor - to be applied today.   Follow-Up: At [facility], you and your health needs are our priority.  As part of our continuing mission to provide you with exceptional heart care, we have created designated Provider Care Teams.  These Care Teams include your primary Cardiologist (physician) and Advanced Practice Providers (APPs -  Physician Assistants and Nurse Practitioners) who all work together to provide you with the care you need, when you need it.  Your next appointment:   6 week(s)  The format for your next appointment:   In Person  Provider:   Lauree Chandler, MD  Or Advanced Practice Provider (NP or PA)    Other Instructions ZIO XT- Long Term Monitor Instructions  Your physician has requested you wear a ZIO patch monitor for 14 days.  This is a single patch monitor. [employer] supplies one patch monitor per enrollment. Additional stickers are not available. Please do not apply patch if you will be having a Nuclear Stress Test,  Echocardiogram, Cardiac CT, MRI, or Chest Xray during the period you would be wearing the  monitor. The patch cannot be worn during these tests. You cannot remove and re-apply the  ZIO  XT patch monitor.  Your ZIO patch monitor will be mailed 3 day USPS to your address on file. It may take 3-5 days  to receive your monitor after you have been enrolled.  Once you have received your monitor, please review the enclosed instructions. Your monitor  has already been registered assigning a specific monitor serial # to you.  Billing and Patient Assistance Program Information  We have supplied [employer] with any of your insurance information on file for billing purposes. [employer] offers a sliding scale Patient Assistance Program for patients that do not have  insurance, or whose insurance does not completely cover the cost of the ZIO monitor.  You must apply for the Patient Assistance Program to qualify for this discounted rate.  To apply, please call [employer] at [phone], select option 4, select option 2, ask to apply for  Patient Assistance Program. Theodore Demark will ask your household income, and how many people  are in your household. They will quote your out-of-pocket cost based on that information.  [employer] will also be able to set up a 33-month interest-free payment plan if needed.  Applying the monitor   Shave hair from upper left chest.  Hold abrader disc by orange tab. Rub abrader in 40 strokes over the upper left chest as  indicated in your monitor instructions.  Clean area with 4 enclosed alcohol pads. Let dry.  Apply patch as indicated in monitor instructions. Patch will be placed under collarbone on left  side of chest with arrow pointing upward.  Rub patch adhesive wings for 2  minutes. Remove white label marked "1". Remove the white  label marked "2". Rub patch adhesive wings for 2 additional minutes.  While looking in a mirror, press and release button in center of patch. A small green light will  flash 3-4 times. This will be your only indicator that the monitor has been turned on.  Do not shower for the first 24 hours. You may shower after the first 24 hours.   Press the button if you feel a symptom. You will hear a small click. Record Date, Time and  Symptom in the Patient Logbook.  When you are ready to remove the patch, follow instructions on the last 2 pages of Patient  Logbook. Stick patch monitor onto the last page of Patient Logbook.  Place Patient Logbook in the blue and white box. Use locking tab on box and tape box closed  securely. The blue and white box has prepaid postage on it. Please place it in the mailbox as  soon as possible. Your physician should have your test results approximately 7 days after the  monitor has been mailed back to [facility].  Call [facility] at [phone] if you have questions regarding  your ZIO XT patch monitor. Call them immediately if you see an orange light blinking on your  monitor.  If your monitor falls off in less than 4 days, contact our Monitor department at [phone].  If your monitor becomes loose or falls off after 4 days call [employer] at [phone] for  suggestions on securing your monitor   Important Information About [place]

## 2021-09-12 NOTE — Progress Notes (Signed)
Chief Complaint  Patient presents with   Follow-up    CAD   History of Present Illness: 81 yo male with history of DM, HTN and CAD here today for follow up. He was admitted to [facility] November 2018 with a NSTEMI. Cardiac cath November 2018 with severe stenosis in the mid LAD and mid RCA treated with 3 drug eluting stents in LAD and one drug eluting stent in the mid RCA. The posterolateral branch had a severe stenosis treated with balloon angioplasty. Echo November 2018 with normal LV systolic function. He was placed on ASA and Brilinta but admitted December 2018 with melena and chest pain. EGD showed a non bleeding ulcer and gastritis. ASA was stopped and Brilinta was continued.  He had severe abdominal pains while on a statin and stopped this. He has tolerated Zetia. He was seen in March 2020 by Ermalinda Barrios, PA and had c/o chest pain when walking in cold weather. Imdur was increased. Nuclear stress test 04/15/18 with no ischemia. He was admitted to [facility] September 2021 with chest pain. Cardiac cath 10/10/19 with patent LAD and RCA stents with moderate stenosis in the small Circumflex and moderate stenosis in the PDA. Also severe disease in an RV marginal branch. Normal LV function. Medical therapy recommended. He has since been on Plavix.   He is here today for follow up. The patient denies any chest pain, dyspnea, palpitations, lower extremity edema, orthopnea, PND, near syncope or syncope. He has been having dizziness every day. HR has been in the 50s. BP has been soft at home.    Primary Care Physician: Wenda Low, MD  Past Medical History:  Diagnosis Date   Anticoagulated    plavix   Arthritis    Shoulder, knees, back    CAD in native artery cardiologist-  dr Angelena Form   a. CAD/NSTEMI ,  cardiac cath staged stenting-- 12-25-2016  PTCA and DES x3 to prox. and mid LAD;  12-26-2016  PCI to PLA and DES x1 to midRCA,  EF 60-65%.   Chronic lower back pain    CKD (chronic kidney disease), stage  III (HCC)    Complication of anesthesia    "Too much with shoulder surgery", pt. reports that he was told the at they "lost him, due to absorbing too much anesthesia".  shoulder surgery 1985   DDD (degenerative disc disease), lumbosacral    GERD (gastroesophageal reflux disease)    History of gastric ulcer 01/2017   History of kidney stones    History of malignant melanoma    right side of nose   History of non-ST elevation myocardial infarction (NSTEMI) 12/23/2017   s/p  staged cardiac cath,  s/p  PCI and DEStenting   Hyperlipidemia    Hypertension    IDA (iron deficiency anemia)    Myocardial infarction ([place])    x 2   Neuromuscular disorder (HCC)    neuropathy   Nocturia    RBBB    Renal calculus, right    S/P drug eluting coronary stent placement 12-25-2017,  12-26-2017   PTCA and DES x3 to prox. and mid LAD;  PCI to PLA and DES x1 to midRCA   Type 2 diabetes mellitus treated with insulin ([place])    followed by pcp    Past Surgical History:  Procedure Laterality Date   ANAL FISSURE REPAIR  X 2   BACK SURGERY     CATARACT EXTRACTION W/ INTRAOCULAR LENS  IMPLANT, BILATERAL Bilateral 2017;  2015  COLONOSCOPY WITH PROPOFOL N/A 06/05/2016   Procedure: COLONOSCOPY WITH PROPOFOL;  Surgeon: Garlan Fair, MD;  Location: [facility];  Service: Endoscopy;  Laterality: N/A;   CONVERSION TO TOTAL KNEE Right 08/25/2020   Procedure: Revision right knee unicompartmental arthroplasty to total knee arthroplasty;  Surgeon: Gaynelle Arabian, MD;  Location: [facility];  Service: Orthopedics;  Laterality: Right;   CORONARY ANGIOGRAPHY N/A 12/26/2016   Procedure: CORONARY ANGIOGRAPHY;  Surgeon: Troy Sine, MD;  Location: [place] [facility];  Service: Cardiovascular;  Laterality: N/A;   CORONARY STENT INTERVENTION N/A 12/25/2016   Procedure: CORONARY STENT INTERVENTION;  Surgeon: Burnell Blanks, MD;  Location: [place] [facility];  Service: Cardiovascular;  Laterality: N/A;   CORONARY  STENT INTERVENTION N/A 12/26/2016   Procedure: CORONARY STENT INTERVENTION;  Surgeon: Troy Sine, MD;  Location: [place] [facility];  Service: Cardiovascular;  Laterality: N/A;   CYSTOSCOPY/URETEROSCOPY/HOLMIUM LASER/STENT PLACEMENT Right 01/07/2018   Procedure: RIGHT URETEROSCOPY/HOLMIUM LASER/STENT PLACEMENT;  Surgeon: Lucas Mallow, MD;  Location: [facility];  Service: Urology;  Laterality: Right;   ESOPHAGOGASTRODUODENOSCOPY (EGD) WITH PROPOFOL N/A 01/12/2017   Procedure: ESOPHAGOGASTRODUODENOSCOPY (EGD) WITH PROPOFOL;  Surgeon: Wilford Corner, MD;  Location: [place];  Service: Endoscopy;  Laterality: N/A;   HAND TENDON SURGERY Right 10-29-2002   dr Burney Gauze '@[facility]'$    right index and thumb   KNEE ARTHROSCOPY Bilateral 2009-;2010   '@[facility]'$    LEFT HEART CATH AND CORONARY ANGIOGRAPHY N/A 12/25/2016   Procedure: LEFT HEART CATH AND CORONARY ANGIOGRAPHY;  Surgeon: Burnell Blanks, MD;  Location: [place] [facility];  Service: Cardiovascular;  Laterality: N/A;   LEFT HEART CATH AND CORONARY ANGIOGRAPHY N/A 10/10/2019   Procedure: LEFT HEART CATH AND CORONARY ANGIOGRAPHY;  Surgeon: Troy Sine, MD;  Location: [place] [facility];  Service: Cardiovascular;  Laterality: N/A;   LUMBAR LAMINECTOMY/DECOMPRESSION MICRODISCECTOMY N/A 06/09/2015   Procedure: Laminectomy - T12-L1;  Surgeon: Eustace Moore, MD;  Location: [place] [facility];  Service: Neurosurgery;  Laterality: N/A;  Laminectomy - T12-L1   MEDIAL PARTIAL KNEE REPLACEMENT Bilateral 2009-2010    [facility]    MELANOMA EXCISION Right    "side of my nose"   REVERSE SHOULDER ARTHROPLASTY Right 03/31/2021   Procedure: REVERSE SHOULDER ARTHROPLASTY;  Surgeon: Justice Britain, MD;  Location: [facility];  Service: Orthopedics;  Laterality: Right;  174mn   SHOULDER SURGERY Left 1985   TOTAL SHOULDER ARTHROPLASTY Left 12/06/2012   Procedure: LEFT TOTAL SHOULDER ARTHROPLASTY VERSES A REVERSE TOTAL SHOULDER ARTHROPLASTY;   Surgeon: SAugustin Schooling MD;  Location: [facility]  Service: Orthopedics;  Laterality: Left;    Current Outpatient Medications  Medication Sig Dispense Refill   acetaminophen (TYLENOL) 325 MG tablet Take 2 tablets (650 mg total) by mouth every 6 (six) hours as needed for mild pain or headache.     Alogliptin Benzoate 12.5 MG TABS Take 12.5 mg by mouth daily. 90 tablet 1   amoxicillin (AMOXIL) 500 MG capsule Take 2,000 mg by mouth See admin instructions. Take 2000 mg by mouth 1 hour prior to dental work     aspirin EC 81 MG tablet Take 1 tablet (81 mg total) by mouth daily. Swallow whole. 90 tablet 3   Continuous Blood Gluc Receiver (FREESTYLE LIBRE 2 READER) DEVI As directed 1 each 0   Continuous Blood Gluc Sensor (FREESTYLE LIBRE 2 SENSOR) MISC 1 Piece by Does not apply route every 14 (fourteen) days. 2 each 3   empagliflozin (JARDIANCE) 25  MG TABS tablet Take 12.5 mg by mouth daily.     ezetimibe (ZETIA) 10 MG tablet Take 1 tablet (10 mg total) by mouth daily. 90 tablet 3   HYDROmorphone (DILAUDID) 2 MG tablet Take 1 tablet (2 mg total) by mouth every 4 (four) hours as needed. 20 tablet 0   insulin aspart protamine- aspart (NOVOLOG MIX 70/30) (70-30) 100 UNIT/ML injection Inject 7 Units into the skin 2 (two) times daily with a meal.     iron polysaccharides (NIFEREX) 150 MG capsule Take 150 mg by mouth daily.     isosorbide mononitrate (IMDUR) 60 MG 24 hr tablet Take 60 mg by mouth daily.     losartan (COZAAR) 50 MG tablet Take 1 tablet (50 mg total) by mouth daily. (Patient taking differently: Take 50 mg by mouth at bedtime.) 90 tablet 1   metFORMIN (GLUCOPHAGE XR) 500 MG 24 hr tablet Take 1 tablet (500 mg total) by mouth daily with breakfast. 90 tablet 3   methocarbamol (ROBAXIN) 500 MG tablet Take 1 tablet (500 mg total) by mouth every 6 (six) hours as needed for muscle spasms. 40 tablet 0   metoprolol tartrate (LOPRESSOR) 25 MG tablet TAKE 1 TABLET BY MOUTH TWICE A DAY 180 tablet 1    nitroGLYCERIN (NITROSTAT) 0.4 MG SL tablet Place 1 tablet (0.4 mg total) under the tongue every 5 (five) minutes as needed for chest pain. 30 tablet 12   Omega-3 Fatty Acids (FISH OIL) 1000 MG CAPS Take by mouth daily.     ondansetron (ZOFRAN) 4 MG tablet Take 1 tablet (4 mg total) by mouth every 8 (eight) hours as needed for nausea or vomiting. 10 tablet 0   pantoprazole (PROTONIX) 40 MG tablet Take 40 mg by mouth daily.     rosuvastatin (CRESTOR) 10 MG tablet Take 5 mg by mouth at bedtime.     Vitamin D, Cholecalciferol, 25 MCG (1000 UT) CAPS Take 1,000 Units by mouth daily.      Zinc 50 MG CAPS Take 50 mg by mouth daily.     cyclobenzaprine (FLEXERIL) 10 MG tablet Take 1 tablet (10 mg total) by mouth 3 (three) times daily as needed for muscle spasms. (Patient not taking: Reported on 09/12/2021) 30 tablet 1   No current facility-administered medications for this visit.    Allergies  Allergen Reactions   Statins Other (See Comments)    Severe stomach pain.   Oxycodone Itching   Hydrocodone Other (See Comments)    "messes with my hearing"   Codeine Itching   Morphine And Related Itching    Social History   Socioeconomic History   Marital status: Married    Spouse name: Not on file   Number of children: Not on file   Years of education: Not on file   Highest education level: Not on file  Occupational History   Not on file  Tobacco Use   Smoking status: Never   Smokeless tobacco: Never  Vaping Use   Vaping Use: Never used  Substance and Sexual Activity   Alcohol use: No   Drug use: No   Sexual activity: Not Currently  Other Topics Concern   Not on file  Social History Narrative   Not on file   Social Determinants of Health   Financial Resource Strain: Not on file  Food Insecurity: Not on file  Transportation Needs: Not on file  Physical Activity: Not on file  Stress: Not on file  Social Connections: Not on file  Intimate Partner Violence: Not on file    Family  History  Problem Relation Age of Onset   CAD Father    Heart failure Father    CAD Sister    Stroke Mother    Heart failure Mother     Review of Systems:  As stated in the HPI and otherwise negative.   BP (!) 80/60   Pulse (!) 54   Ht '5\' 8"'$  (1.727 m)   Wt 179 lb (81.2 kg)   SpO2 98%   BMI 27.22 kg/m   Physical Examination:  General: Well developed, well nourished, NAD  HEENT: OP clear, mucus membranes moist  SKIN: warm, dry. No rashes. Neuro: No focal deficits  Musculoskeletal: Muscle strength 5/5 all ext  Psychiatric: Mood and affect normal  Neck: No JVD, no carotid bruits, no thyromegaly, no lymphadenopathy.  Lungs:Clear bilaterally, no wheezes, rhonci, crackles Cardiovascular: Regular brady. No murmurs, gallops or rubs. Abdomen:Soft. Bowel sounds present. Non-tender.  Extremities: No lower extremity edema. Pulses are 2 + in the bilateral DP/PT.  Echo 12/26/16: - Left ventricle: The cavity size was normal. Wall thickness was   normal. Systolic function was normal. The estimated ejection   fraction was in the range of 60% to 65%. Wall motion was normal;   there were no regional wall motion abnormalities. Left   ventricular diastolic function parameters were normal. - Mitral valve: Calcified annulus. - Atrial septum: No defect or patent foramen ovale was identified. - Pulmonary arteries: PA peak pressure: 36 mm Hg (S).  EKG:  EKG is not ordered today The ekg ordered today demonstrates   Recent Labs: 11/11/2020: ALT 13; TSH 1.710 03/21/2021: BUN 19; Creatinine, Ser 1.20; Hemoglobin 12.4; Platelets 200; Potassium 4.8; Sodium 139   Lipid Panel    Component Value Date/Time   CHOL 182 11/11/2020 0855   TRIG 142 11/11/2020 0855   HDL 40 11/11/2020 0855   CHOLHDL 4.6 11/11/2020 0855   CHOLHDL 4.4 12/24/2016 0634   VLDL 13 12/24/2016 0634   LDLCALC 117 (H) 11/11/2020 0855     Wt Readings from Last 3 Encounters:  09/12/21 179 lb (81.2 kg)  07/26/21 185 lb 6.4 oz  (84.1 kg)  03/31/21 188 lb 12.8 oz (85.6 kg)     Other studies Reviewed: Additional studies/ records that were reviewed today include: . Review of the above records demonstrates:    Assessment and Plan:   1. CAD with angina: He has no recent chest pain concerning for angina. Drug eluting stents placed in the LAD and RCA in November 2018 and CAD stable by cath in September 2021. He has normal LV systolic function. Will stop Plavix and start ASA 81 mg daily. Continue Crestor, Zetia, metoprolol and Imdur.   2. Hyperlipidemia: Lipids followed at the [place]. Continue statin and Zetia.    3. HTN: he is hypotensive today. Will stop Norvasc. Echo to evaluate LV function.   4. Dizziness: May be due to hypotension. He is bradycardic today. Will plan a 3 day Zio cardiac monitor to exclude AV block. Echo as above.   Current medicines are reviewed at length with the patient today.  The patient does not have concerns regarding medicines.  The following changes have been made:  no change  Labs/ tests ordered today include:   Orders Placed This Encounter  Procedures   LONG TERM MONITOR (3-14 DAYS)   ECHOCARDIOGRAM COMPLETE      Disposition:   F/U with me in 12 months   Signed, Harrell Gave  Angelena Form, MD 09/12/2021 11:11 AM    [address], [place], [place]  [address] Phone: [phone]; Fax: [phone]

## 2021-09-12 NOTE — Progress Notes (Unsigned)
ZIO XT serial # [id number] from office inventory applied to patient.

## 2021-09-20 ENCOUNTER — Ambulatory Visit (HOSPITAL_COMMUNITY): Payer: Medicare Other | Attending: Cardiovascular Disease

## 2021-09-20 DIAGNOSIS — R42 Dizziness and giddiness: Secondary | ICD-10-CM | POA: Insufficient documentation

## 2021-09-20 DIAGNOSIS — I25118 Atherosclerotic heart disease of native coronary artery with other forms of angina pectoris: Secondary | ICD-10-CM | POA: Diagnosis not present

## 2021-09-20 LAB — ECHOCARDIOGRAM COMPLETE
Area-P 1/2: 1.86 cm2
S' Lateral: 2.5 cm

## 2021-09-21 DIAGNOSIS — R42 Dizziness and giddiness: Secondary | ICD-10-CM | POA: Diagnosis not present

## 2021-09-21 DIAGNOSIS — I25118 Atherosclerotic heart disease of native coronary artery with other forms of angina pectoris: Secondary | ICD-10-CM | POA: Diagnosis not present

## 2021-09-29 ENCOUNTER — Ambulatory Visit: Payer: Self-pay

## 2021-09-29 NOTE — Patient Outreach (Signed)
  Care Coordination   09/29/2021 Name: Matthew [name] MRN: [id number] DOB: January 12, 1941   Care Coordination Outreach Attempts:  An unsuccessful telephone outreach was attempted today to offer the patient information about available care coordination services as a benefit of their health plan.   Follow Up Plan:  Additional outreach attempts will be made to offer the patient care coordination information and services.   Encounter Outcome:  No Answer   Care Coordination Interventions Activated:  No   Care Coordination Interventions:  No, not indicated    [facility] [phone]

## 2021-10-04 NOTE — Progress Notes (Unsigned)
[facility] [address], [place] [address]   CLINIC:  Medical Oncology/Hematology  CONSULT NOTE  Patient Care Team: Wenda Low, MD as PCP - General (Internal Medicine) Burnell Blanks, MD as PCP - Cardiology (Cardiology) Carmon Ginsberg as Physician Assistant (Orthopedic Surgery)  CHIEF COMPLAINTS/PURPOSE OF CONSULTATION:  Iron deficiency anemia  HISTORY OF PRESENTING ILLNESS:  Matthew [name] 81 y.o. male is here at the request of [facility] hematologist for ongoing management of iron deficiency anemia.  Patient had virtual consultation with [facility] hematologist on 07/18/2021.  Patient's anemia and iron profile from February 2023 showed Hgb 13.0, ferritin 16, saturation 16%.  Has been taking iron tablet Monday/Wednesday/Friday since February 2023 for iron deficiency anemia.  Repeat anemia panel in June 2023 showed Hgb 10.8, WBC 5.9, platelets 229, ferritin 13.6, iron saturation 13.2%.  This is postoperative from shoulder surgery.  Per [facility]: "Patient's anemia and iron profile consistent with iron deficiency, most common reason for iron deficiency being GI blood loss.  He may have had some blood loss during his shoulder surgery, although his iron deficiency predates that.  No evidence of blood loss in the urine on urinalysis.  No evidence of primary bone marrow disorder, as his other cell lines are normal and he has significant iron deficiency (which would not be seen in the setting of bone marrow disorder).  He has not had any recent EGD or colonoscopy, as his iron deficiency appears to be a new issue." Since he failed to improve on oral iron under the direction of [facility], he was referred to local hematology clinic for IV iron.  Patient reports that he was referred to [facility] gastroenterologist and had upper GI study in July 2023 that was negative for any source of bleeding, per his report.  He reports that  his last colonoscopy was 5+ years ago, reportedly normal.  He has an appointment to continue GI follow-up on 10/30/2021.  Per medical records, patient has a history of critical anemia with Hgb 6.8 in December 2019, but he does not remember this incident and denies any prior history of RBCs.  He has history of nonbleeding gastric ulcer in 2018.  Denies any symptoms of current bleeding such as bright blood per rectum or melena.  He reports mild to moderate fatigue and headaches.  No chest pain, dyspnea on exertion, palpitations, lightheadedness, or syncope.  He has been taking iron tablet 3 times weekly since February 2023.  He also takes aspirin and pantoprazole.  He was previously taking Plavix, but this was stopped by cardiologist in July 2023.  Medical history is significant for hypertension, CAD/history MI s/p stent, CKD stage III, history of gastric ulcer, diabetes, GERD, hyperlipidemia, and BPH.  He is retired from work as an Production assistant, radio.  He lives at home with his wife.  He is functionally independent with ADLs, ambulates with cane.  He denies any current or previous use of tobacco, alcohol, or illicit drugs.  No family history of cancer, blood problems, or gastrointestinal disorders.   MEDICAL HISTORY:  Past Medical History:  Diagnosis Date   Anticoagulated    plavix   Arthritis    Shoulder, knees, back    CAD in native artery cardiologist-  dr Angelena Form   a. CAD/NSTEMI ,  cardiac cath staged stenting-- 12-25-2016  PTCA and DES x3 to prox. and mid LAD;  12-26-2016  PCI to PLA and DES x1 to  midRCA,  EF 60-65%.   Chronic lower back pain    CKD (chronic kidney disease), stage III (HCC)    Complication of anesthesia    "Too much with shoulder surgery", pt. reports that he was told the at they "lost him, due to absorbing too much anesthesia".  shoulder surgery 1985   DDD (degenerative disc disease), lumbosacral    GERD (gastroesophageal reflux disease)    History of  gastric ulcer 01/2017   History of kidney stones    History of malignant melanoma    right side of nose   History of non-ST elevation myocardial infarction (NSTEMI) 12/23/2017   s/p  staged cardiac cath,  s/p  PCI and DEStenting   Hyperlipidemia    Hypertension    IDA (iron deficiency anemia)    Myocardial infarction ([place])    x 2   Neuromuscular disorder (HCC)    neuropathy   Nocturia    RBBB    Renal calculus, right    S/P drug eluting coronary stent placement 12-25-2017,  12-26-2017   PTCA and DES x3 to prox. and mid LAD;  PCI to PLA and DES x1 to midRCA   Type 2 diabetes mellitus treated with insulin ([place])    followed by pcp    SURGICAL HISTORY: Past Surgical History:  Procedure Laterality Date   ANAL FISSURE REPAIR  X 2   BACK SURGERY     CATARACT EXTRACTION W/ INTRAOCULAR LENS  IMPLANT, BILATERAL Bilateral 2017;  2015   COLONOSCOPY WITH PROPOFOL N/A 06/05/2016   Procedure: COLONOSCOPY WITH PROPOFOL;  Surgeon: Garlan Fair, MD;  Location: [facility];  Service: Endoscopy;  Laterality: N/A;   CONVERSION TO TOTAL KNEE Right 08/25/2020   Procedure: Revision right knee unicompartmental arthroplasty to total knee arthroplasty;  Surgeon: Gaynelle Arabian, MD;  Location: [facility];  Service: Orthopedics;  Laterality: Right;   CORONARY ANGIOGRAPHY N/A 12/26/2016   Procedure: CORONARY ANGIOGRAPHY;  Surgeon: Troy Sine, MD;  Location: [facility];  Service: Cardiovascular;  Laterality: N/A;   CORONARY STENT INTERVENTION N/A 12/25/2016   Procedure: CORONARY STENT INTERVENTION;  Surgeon: Burnell Blanks, MD;  Location: [place] [facility];  Service: Cardiovascular;  Laterality: N/A;   CORONARY STENT INTERVENTION N/A 12/26/2016   Procedure: CORONARY STENT INTERVENTION;  Surgeon: Troy Sine, MD;  Location: [place] [facility];  Service: Cardiovascular;  Laterality: N/A;   CYSTOSCOPY/URETEROSCOPY/HOLMIUM LASER/STENT PLACEMENT Right 01/07/2018   Procedure: RIGHT  URETEROSCOPY/HOLMIUM LASER/STENT PLACEMENT;  Surgeon: Lucas Mallow, MD;  Location: [facility];  Service: Urology;  Laterality: Right;   ESOPHAGOGASTRODUODENOSCOPY (EGD) WITH PROPOFOL N/A 01/12/2017   Procedure: ESOPHAGOGASTRODUODENOSCOPY (EGD) WITH PROPOFOL;  Surgeon: Wilford Corner, MD;  Location: [facility];  Service: Endoscopy;  Laterality: N/A;   HAND TENDON SURGERY Right 10-29-2002   dr Burney Gauze '@[facility]'$    right index and thumb   KNEE ARTHROSCOPY Bilateral 2009-;2010   '@[facility]'$    LEFT HEART CATH AND CORONARY ANGIOGRAPHY N/A 12/25/2016   Procedure: LEFT HEART CATH AND CORONARY ANGIOGRAPHY;  Surgeon: Burnell Blanks, MD;  Location: [place] [facility];  Service: Cardiovascular;  Laterality: N/A;   LEFT HEART CATH AND CORONARY ANGIOGRAPHY N/A 10/10/2019   Procedure: LEFT HEART CATH AND CORONARY ANGIOGRAPHY;  Surgeon: Troy Sine, MD;  Location: [facility];  Service: Cardiovascular;  Laterality: N/A;   LUMBAR LAMINECTOMY/DECOMPRESSION MICRODISCECTOMY N/A 06/09/2015   Procedure: Laminectomy - T12-L1;  Surgeon: Eustace Moore, MD;  Location: [facility];  Service: Neurosurgery;  Laterality: N/A;  Laminectomy - T12-L1   MEDIAL PARTIAL KNEE REPLACEMENT Bilateral 2009-2010    [facility]    MELANOMA EXCISION Right    "side of my nose"   REVERSE SHOULDER ARTHROPLASTY Right 03/31/2021   Procedure: REVERSE SHOULDER ARTHROPLASTY;  Surgeon: Justice Britain, MD;  Location: [facility];  Service: Orthopedics;  Laterality: Right;  139mn   SHOULDER SURGERY Left 1985   TOTAL SHOULDER ARTHROPLASTY Left 12/06/2012   Procedure: LEFT TOTAL SHOULDER ARTHROPLASTY VERSES A REVERSE TOTAL SHOULDER ARTHROPLASTY;  Surgeon: SAugustin Schooling MD;  Location: [facility]  Service: Orthopedics;  Laterality: Left;    SOCIAL HISTORY: Social History   Socioeconomic History   Marital status: Married    Spouse name: Not on file   Number of children: Not on file   Years of education: Not on file    Highest education level: Not on file  Occupational History   Not on file  Tobacco Use   Smoking status: Never   Smokeless tobacco: Never  Vaping Use   Vaping Use: Never used  Substance and Sexual Activity   Alcohol use: No   Drug use: No   Sexual activity: Not Currently  Other Topics Concern   Not on file  Social History Narrative   Not on file   Social Determinants of Health   Financial Resource Strain: Not on file  Food Insecurity: Not on file  Transportation Needs: Not on file  Physical Activity: Not on file  Stress: Not on file  Social Connections: Not on file  Intimate Partner Violence: Not on file    FAMILY HISTORY: Family History  Problem Relation Age of Onset   CAD Father    Heart failure Father    CAD Sister    Stroke Mother    Heart failure Mother     ALLERGIES:  is allergic to statins, oxycodone, hydrocodone, codeine, and morphine and related.  MEDICATIONS:  Current Outpatient Medications  Medication Sig Dispense Refill   acetaminophen (TYLENOL) 325 MG tablet Take 2 tablets (650 mg total) by mouth every 6 (six) hours as needed for mild pain or headache.     Alogliptin Benzoate 12.5 MG TABS Take 12.5 mg by mouth daily. 90 tablet 1   amoxicillin (AMOXIL) 500 MG capsule Take 2,000 mg by mouth See admin instructions. Take 2000 mg by mouth 1 hour prior to dental work     aspirin EC 81 MG tablet Take 1 tablet (81 mg total) by mouth daily. Swallow whole. 90 tablet 3   Continuous Blood Gluc Receiver (FREESTYLE LIBRE 2 READER) DEVI As directed 1 each 0   Continuous Blood Gluc Sensor (FREESTYLE LIBRE 2 SENSOR) MISC 1 Piece by Does not apply route every 14 (fourteen) days. 2 each 3   cyclobenzaprine (FLEXERIL) 10 MG tablet Take 1 tablet (10 mg total) by mouth 3 (three) times daily as needed for muscle spasms. (Patient not taking: Reported on 09/12/2021) 30 tablet 1   empagliflozin (JARDIANCE) 25 MG TABS tablet Take 12.5 mg by mouth daily.     ezetimibe (ZETIA) 10  MG tablet Take 1 tablet (10 mg total) by mouth daily. 90 tablet 3   HYDROmorphone (DILAUDID) 2 MG tablet Take 1 tablet (2 mg total) by mouth every 4 (four) hours as needed. 20 tablet 0   insulin aspart protamine- aspart (NOVOLOG MIX 70/30) (70-30) 100 UNIT/ML injection Inject 7 Units into the skin 2 (two) times daily with a meal.     iron polysaccharides (  NIFEREX) 150 MG capsule Take 150 mg by mouth daily.     isosorbide mononitrate (IMDUR) 60 MG 24 hr tablet Take 60 mg by mouth daily.     losartan (COZAAR) 50 MG tablet Take 1 tablet (50 mg total) by mouth daily. (Patient taking differently: Take 50 mg by mouth at bedtime.) 90 tablet 1   metFORMIN (GLUCOPHAGE XR) 500 MG 24 hr tablet Take 1 tablet (500 mg total) by mouth daily with breakfast. 90 tablet 3   methocarbamol (ROBAXIN) 500 MG tablet Take 1 tablet (500 mg total) by mouth every 6 (six) hours as needed for muscle spasms. 40 tablet 0   metoprolol tartrate (LOPRESSOR) 25 MG tablet TAKE 1 TABLET BY MOUTH TWICE A DAY 180 tablet 1   nitroGLYCERIN (NITROSTAT) 0.4 MG SL tablet Place 1 tablet (0.4 mg total) under the tongue every 5 (five) minutes as needed for chest pain. 30 tablet 12   Omega-3 Fatty Acids (FISH OIL) 1000 MG CAPS Take by mouth daily.     ondansetron (ZOFRAN) 4 MG tablet Take 1 tablet (4 mg total) by mouth every 8 (eight) hours as needed for nausea or vomiting. 10 tablet 0   pantoprazole (PROTONIX) 40 MG tablet Take 40 mg by mouth daily.     rosuvastatin (CRESTOR) 10 MG tablet Take 5 mg by mouth at bedtime.     Vitamin D, Cholecalciferol, 25 MCG (1000 UT) CAPS Take 1,000 Units by mouth daily.      Zinc 50 MG CAPS Take 50 mg by mouth daily.     No current facility-administered medications for this visit.    REVIEW OF SYSTEMS:   Review of Systems  Constitutional:  Positive for fatigue. Negative for appetite change, chills, diaphoresis, fever and unexpected weight change.  HENT:   Negative for lump/mass and nosebleeds.   Eyes:   Negative for eye problems.  Respiratory:  Negative for cough, hemoptysis and shortness of breath.   Cardiovascular:  Negative for chest pain, leg swelling and palpitations.  Gastrointestinal:  Negative for abdominal pain, blood in stool, constipation, diarrhea, nausea and vomiting.  Genitourinary:  Positive for frequency. Negative for hematuria.   Skin: Negative.   Neurological:  Positive for numbness. Negative for dizziness, headaches and light-headedness.  Hematological:  Does not bruise/bleed easily.  Psychiatric/Behavioral:  Positive for sleep disturbance.       PHYSICAL EXAMINATION: ECOG PERFORMANCE STATUS: 1 - Symptomatic but completely ambulatory  There were no vitals filed for this visit. There were no vitals filed for this visit.  Physical Exam Constitutional:      Appearance: Normal appearance.  HENT:     Head: Normocephalic and atraumatic.     Mouth/Throat:     Mouth: Mucous membranes are moist.  Eyes:     Extraocular Movements: Extraocular movements intact.     Pupils: Pupils are equal, round, and reactive to light.  Cardiovascular:     Rate and Rhythm: Normal rate and regular rhythm.     Pulses: Normal pulses.     Heart sounds: Normal heart sounds.  Pulmonary:     Effort: Pulmonary effort is normal.     Breath sounds: Normal breath sounds.  Abdominal:     General: Bowel sounds are normal.     Palpations: Abdomen is soft.     Tenderness: There is no abdominal tenderness.  Musculoskeletal:        General: No swelling.     Right lower leg: No edema.     Left lower leg:  No edema.  Lymphadenopathy:     Cervical: No cervical adenopathy.  Skin:    General: Skin is warm and dry.  Neurological:     General: No focal deficit present.     Mental Status: He is alert and oriented to person, place, and time.  Psychiatric:        Mood and Affect: Mood normal.        Behavior: Behavior normal.       LABORATORY DATA:  I have reviewed the data as listed Recent  Results (from the past 2160 hour(s))  Hemoglobin A1c     Status: None   Collection Time: 07/20/21 12:00 AM  Result Value Ref Range   Hemoglobin A1C 7.0   ECHOCARDIOGRAM COMPLETE     Status: None   Collection Time: 09/20/21  4:04 PM  Result Value Ref Range   Area-P 1/2 1.86 cm2   S' Lateral 2.50 cm    RADIOGRAPHIC STUDIES: I have personally reviewed the radiological images as listed and agreed with the findings in the report.  ASSESSMENT & PLAN: 1.  Iron deficiency anemia - Referred by [facility] hematologist for local management of iron deficiency anemia - Labs from February 2023 ([place]) showed Hgb 13.0, ferritin 16, iron saturation 16% - Started on iron tablet Monday Wednesday Friday in February 2023 - Repeat labs from June 2023 ([place]) showed worsening IDA with Hgb 10.8, ferritin 13.6, iron saturation 13.2% - Patient denies any bright red blood per rectum or melena. - Reportedly saw [facility] gastroenterologist for your upper GI study in July 2023 that was negative for any source of bleeding.  Prior EGD in December 2018 showed nonbleeding gastric ulcer.  He reports that his last colonoscopy was 5+ years ago, reportedly normal.  He has an appointment to continue GI follow-up on 10/30/2021 - Patient takes aspirin and Protonix.  Was previously on Plavix, which was stopped in July 2023. - PLAN: We will schedule patient for IV Venofer 300 mg x 3 due to progressive iron deficiency anemia despite oral iron supplementation.  We discussed potential side effects including risk of allergic reaction, and patient is agreeable to proceed. - We will check full anemia panel to rule out other causes of anemia. - We will check stool cards x3 - We will repeat CBC/iron panel with RTC in 2 months  2.  Other history - PMH: Hypertension, CAD/history MI s/p stent, CKD stage III, history of gastric ulcer, diabetes, GERD, hyperlipidemia, and BPH. - SOCIAL: Retired from work as an Production assistant, radio.  He lives at home  with his wife.  He is functionally independent with ADLs, ambulates with cane.  He denies any current or previous use of tobacco, alcohol, or illicit drugs. - FAMILY: No family history of cancer, blood problems, or gastrointestinal disorders.   All questions were answered. The patient knows to call the clinic with any problems, questions or concerns.   Medical decision making: Moderate  Time spent on visit: I spent 35 minutes counseling the patient face to face. The total time spent in the appointment was 50 minutes and more than 50% was on counseling.  I, Tarri Abernethy PA-C, have seen this patient in conjunction with Dr. Derek Jack.  Greater than 50% of visit was performed by Dr. Delton Coombes.   Harriett Rush, PA-C 10/05/2021 9:42 AM  DR. Channah Godeaux: I have independently evaluated this patient and formulated my assessment and plan.  Patient seen at the request of [facility] for management of  iron deficiency anemia locally.  He reportedly had a EGD recently which was unrevealing.  He denies any frank bleeding.  Latest hemoglobin was 10.8.  He has been taking iron tablet 3 times weekly for the past 3 months.  Does not report any major side effects from it.  However did not notice any major improvement in energy levels.  Recommend checking for nutritional deficiencies and stool for occult blood.  Recommend parenteral iron therapy for a total of 1 g.  Discussed side effects of parenteral iron therapy in detail.

## 2021-10-05 ENCOUNTER — Inpatient Hospital Stay: Payer: Medicare Other | Attending: Hematology | Admitting: Hematology

## 2021-10-05 ENCOUNTER — Encounter: Payer: Self-pay | Admitting: Hematology

## 2021-10-05 VITALS — BP 166/80 | HR 56 | Temp 97.9°F | Resp 18 | Ht 68.0 in | Wt 179.7 lb

## 2021-10-05 DIAGNOSIS — D500 Iron deficiency anemia secondary to blood loss (chronic): Secondary | ICD-10-CM

## 2021-10-05 DIAGNOSIS — Z955 Presence of coronary angioplasty implant and graft: Secondary | ICD-10-CM

## 2021-10-05 DIAGNOSIS — Z79899 Other long term (current) drug therapy: Secondary | ICD-10-CM | POA: Diagnosis not present

## 2021-10-05 DIAGNOSIS — I252 Old myocardial infarction: Secondary | ICD-10-CM | POA: Diagnosis not present

## 2021-10-05 DIAGNOSIS — I129 Hypertensive chronic kidney disease with stage 1 through stage 4 chronic kidney disease, or unspecified chronic kidney disease: Secondary | ICD-10-CM

## 2021-10-05 DIAGNOSIS — N183 Chronic kidney disease, stage 3 unspecified: Secondary | ICD-10-CM | POA: Diagnosis not present

## 2021-10-05 DIAGNOSIS — E1122 Type 2 diabetes mellitus with diabetic chronic kidney disease: Secondary | ICD-10-CM | POA: Diagnosis not present

## 2021-10-05 DIAGNOSIS — K219 Gastro-esophageal reflux disease without esophagitis: Secondary | ICD-10-CM

## 2021-10-05 DIAGNOSIS — I2510 Atherosclerotic heart disease of native coronary artery without angina pectoris: Secondary | ICD-10-CM | POA: Diagnosis not present

## 2021-10-05 DIAGNOSIS — D649 Anemia, unspecified: Secondary | ICD-10-CM | POA: Insufficient documentation

## 2021-10-05 DIAGNOSIS — Z7982 Long term (current) use of aspirin: Secondary | ICD-10-CM

## 2021-10-05 DIAGNOSIS — D509 Iron deficiency anemia, unspecified: Secondary | ICD-10-CM

## 2021-10-05 DIAGNOSIS — E785 Hyperlipidemia, unspecified: Secondary | ICD-10-CM

## 2021-10-05 DIAGNOSIS — Z96611 Presence of right artificial shoulder joint: Secondary | ICD-10-CM | POA: Diagnosis not present

## 2021-10-05 HISTORY — DX: Iron deficiency anemia secondary to blood loss (chronic): D50.0

## 2021-10-05 NOTE — Patient Instructions (Addendum)
[facility] **   You were seen today by Dr. Delton Coombes and Tarri Abernethy PA-C for your iron deficiency anemia.    IRON DEFICIENCY ANEMIA Take iron pill (ferrous gluconate) and Vitamin C (ascorbic acid) EVERY DAY - take these medications 2 hours after your pantoprozole (Protonix). We will check labs NEXT week  We will schedule you for IV iron x 3 doses We will check your stool to see if you have any blood in your bowel movements.   Continue to follow-up with your GI doctors at the [place] to see if you have any bleeding in your intestines. We will see you for labs and follow-up visit in 2 months.  ** Thank you for trusting me with your healthcare!  I strive to provide all of my patients with quality care at each visit.  If you receive a survey for this visit, I would be so grateful to you for taking the time to provide feedback.  Thank you in advance!  ~ Aracelys Glade                   Dr. Derek Jack   &   Tarri Abernethy, PA-C   - - - - - - - - - - - - - - - - - -    Thank you for choosing [facility] to provide your oncology and hematology care.  To afford each patient quality time with our provider, please arrive at least 15 minutes before your scheduled appointment time.   If you have a lab appointment with the [facility] please come in thru the Main Entrance and check in at the main information desk.  You need to re-schedule your appointment should you arrive 10 or more minutes late.  We strive to give you quality time with our providers, and arriving late affects you and other patients whose appointments are after yours.  Also, if you no show three or more times for appointments you may be dismissed from the clinic at the providers discretion.     Again, thank you for choosing [facility].  Our hope is that these requests will decrease the amount of  time that you wait before being seen by our physicians.       _____________________________________________________________  Should you have questions after your visit to [facility], please contact our office at [phone] and follow the prompts.  Our office hours are 8:00 a.m. and 4:30 p.m. Monday - Friday.  Please note that voicemails left after 4:00 p.m. may not be returned until the following business day.  We are closed weekends and major holidays.  You do have access to a nurse 24-7, just call the main number to the clinic [phone] and do not press any options, hold on the line and a nurse will answer the phone.    For prescription refill requests, have your pharmacy contact our office and allow 72 hours.

## 2021-10-11 ENCOUNTER — Inpatient Hospital Stay: Payer: Medicare Other | Attending: Hematology

## 2021-10-11 DIAGNOSIS — D509 Iron deficiency anemia, unspecified: Secondary | ICD-10-CM | POA: Insufficient documentation

## 2021-10-11 DIAGNOSIS — D649 Anemia, unspecified: Secondary | ICD-10-CM

## 2021-10-11 LAB — COMPREHENSIVE METABOLIC PANEL
ALT: 20 U/L (ref 0–44)
AST: 20 U/L (ref 15–41)
Albumin: 3.7 g/dL (ref 3.5–5.0)
Alkaline Phosphatase: 158 U/L — ABNORMAL HIGH (ref 38–126)
Anion gap: 7 (ref 5–15)
BUN: 15 mg/dL (ref 8–23)
CO2: 27 mmol/L (ref 22–32)
Calcium: 8.8 mg/dL — ABNORMAL LOW (ref 8.9–10.3)
Chloride: 107 mmol/L (ref 98–111)
Creatinine, Ser: 1.25 mg/dL — ABNORMAL HIGH (ref 0.61–1.24)
GFR, Estimated: 59 mL/min — ABNORMAL LOW (ref >60)
Glucose, Bld: 213 mg/dL — ABNORMAL HIGH (ref 70–99)
Potassium: 4.1 mmol/L (ref 3.5–5.1)
Sodium: 141 mmol/L (ref 135–145)
Total Bilirubin: 1 mg/dL (ref 0.3–1.2)
Total Protein: 6.4 g/dL — ABNORMAL LOW (ref 6.5–8.1)

## 2021-10-11 LAB — CBC WITH DIFFERENTIAL/PLATELET
Abs Immature Granulocytes: 0 10*3/uL (ref 0.00–0.07)
Basophils Absolute: 0 10*3/uL (ref 0.0–0.1)
Basophils Relative: 1 %
Eosinophils Absolute: 0 10*3/uL (ref 0.0–0.5)
Eosinophils Relative: 1 %
HCT: 38.9 % — ABNORMAL LOW (ref 39.0–52.0)
Hemoglobin: 12.1 g/dL — ABNORMAL LOW (ref 13.0–17.0)
Immature Granulocytes: 0 %
Lymphocytes Relative: 59 %
Lymphs Abs: 1.9 10*3/uL (ref 0.7–4.0)
MCH: 29.2 pg (ref 26.0–34.0)
MCHC: 31.1 g/dL (ref 30.0–36.0)
MCV: 93.7 fL (ref 80.0–100.0)
Monocytes Absolute: 0.4 10*3/uL (ref 0.1–1.0)
Monocytes Relative: 13 %
Neutro Abs: 0.8 10*3/uL — ABNORMAL LOW (ref 1.7–7.7)
Neutrophils Relative %: 26 %
Platelets: 142 10*3/uL — ABNORMAL LOW (ref 150–400)
RBC: 4.15 MIL/uL — ABNORMAL LOW (ref 4.22–5.81)
RDW: 15 % (ref 11.5–15.5)
WBC: 3.2 10*3/uL — ABNORMAL LOW (ref 4.0–10.5)
nRBC: 0 % (ref 0.0–0.2)

## 2021-10-11 LAB — LACTATE DEHYDROGENASE: LDH: 165 U/L (ref 98–192)

## 2021-10-11 LAB — RETICULOCYTES
Immature Retic Fract: 16.1 % — ABNORMAL HIGH (ref 2.3–15.9)
RBC.: 4.14 MIL/uL — ABNORMAL LOW (ref 4.22–5.81)
Retic Count, Absolute: 74.1 10*3/uL (ref 19.0–186.0)
Retic Ct Pct: 1.8 % (ref 0.4–3.1)

## 2021-10-11 LAB — IRON AND TIBC
Iron: 81 ug/dL (ref 45–182)
Saturation Ratios: 24 % (ref 17.9–39.5)
TIBC: 331 ug/dL (ref 250–450)
UIBC: 250 ug/dL

## 2021-10-11 LAB — FOLATE: Folate: 20.6 ng/mL (ref >5.9)

## 2021-10-11 LAB — FERRITIN: Ferritin: 19 ng/mL — ABNORMAL LOW (ref 24–336)

## 2021-10-11 LAB — VITAMIN B12: Vitamin B-12: 298 pg/mL (ref 180–914)

## 2021-10-12 LAB — HAPTOGLOBIN: Haptoglobin: 123 mg/dL (ref 34–355)

## 2021-10-12 LAB — KAPPA/LAMBDA LIGHT CHAINS
Kappa free light chain: 32.6 mg/L — ABNORMAL HIGH (ref 3.3–19.4)
Kappa, lambda light chain ratio: 1.55 (ref 0.26–1.65)
Lambda free light chains: 21.1 mg/L (ref 5.7–26.3)

## 2021-10-13 LAB — METHYLMALONIC ACID, SERUM: Methylmalonic Acid, Quantitative: 319 nmol/L (ref 0–378)

## 2021-10-14 ENCOUNTER — Other Ambulatory Visit: Payer: Self-pay

## 2021-10-14 DIAGNOSIS — D649 Anemia, unspecified: Secondary | ICD-10-CM

## 2021-10-14 DIAGNOSIS — D509 Iron deficiency anemia, unspecified: Secondary | ICD-10-CM | POA: Diagnosis not present

## 2021-10-14 LAB — OCCULT BLOOD X 1 CARD TO LAB, STOOL
Fecal Occult Bld: NEGATIVE
Fecal Occult Bld: NEGATIVE
Fecal Occult Bld: NEGATIVE

## 2021-10-16 LAB — COPPER, SERUM: Copper: 86 ug/dL (ref 69–132)

## 2021-10-17 LAB — PROTEIN ELECTROPHORESIS, SERUM
A/G Ratio: 1.5 (ref 0.7–1.7)
Albumin ELP: 3.5 g/dL (ref 2.9–4.4)
Alpha-1-Globulin: 0.2 g/dL (ref 0.0–0.4)
Alpha-2-Globulin: 0.7 g/dL (ref 0.4–1.0)
Beta Globulin: 0.8 g/dL (ref 0.7–1.3)
Gamma Globulin: 0.7 g/dL (ref 0.4–1.8)
Globulin, Total: 2.4 g/dL (ref 2.2–3.9)
Total Protein ELP: 5.9 g/dL — ABNORMAL LOW (ref 6.0–8.5)

## 2021-10-18 DIAGNOSIS — R509 Fever, unspecified: Secondary | ICD-10-CM | POA: Diagnosis not present

## 2021-10-18 LAB — IMMUNOFIXATION ELECTROPHORESIS
IgA: 184 mg/dL (ref 61–437)
IgG (Immunoglobin G), Serum: 825 mg/dL (ref 603–1613)
IgM (Immunoglobulin M), Srm: 42 mg/dL (ref 15–143)
Total Protein ELP: 5.8 g/dL — ABNORMAL LOW (ref 6.0–8.5)

## 2021-10-19 ENCOUNTER — Inpatient Hospital Stay: Payer: Medicare Other

## 2021-10-24 IMAGING — CT CT HEAD W/O CM
1 series · 16 of 30 positions shown, 20 images · non-contrast
Comparison: None.

CLINICAL DATA: Ataxia.

Technologist notes state pain above left ear on and able to forward.
Confusion and headache.
EXAM:
CT HEAD WITHOUT CONTRAST
TECHNIQUE: Contiguous axial images were obtained from the base of the skull
through the vertex without intravenous contrast.

[Series 2: head w/(date) · axial · 0.48mm/px · z∈[-173,-33]mm · 16 of 32 slices shown, 20 images]
[im 2/32  brain]
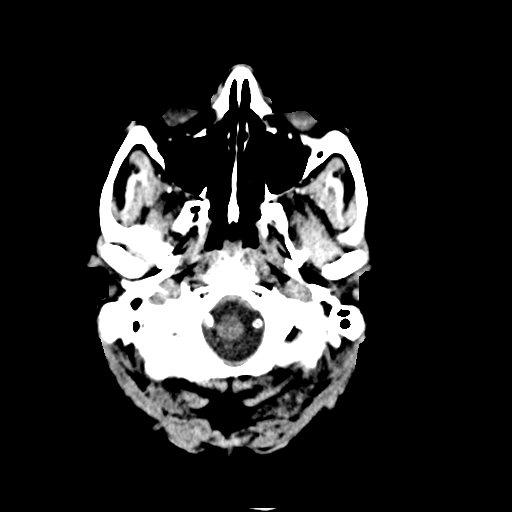
[im 2/32  bone]
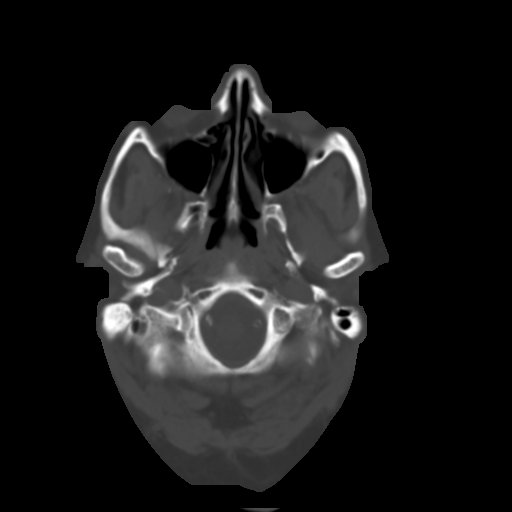
[im 4/32  brain]
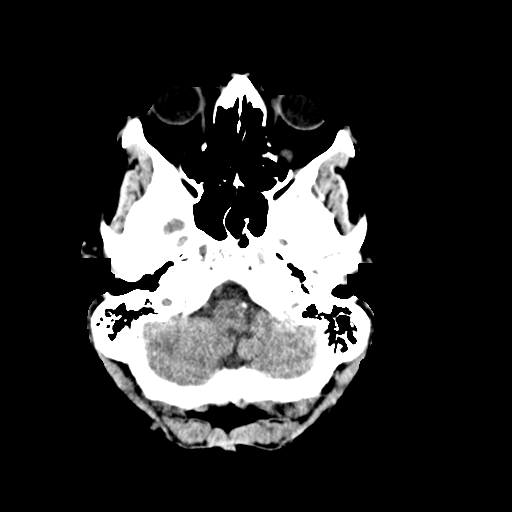
[im 6/32  brain]
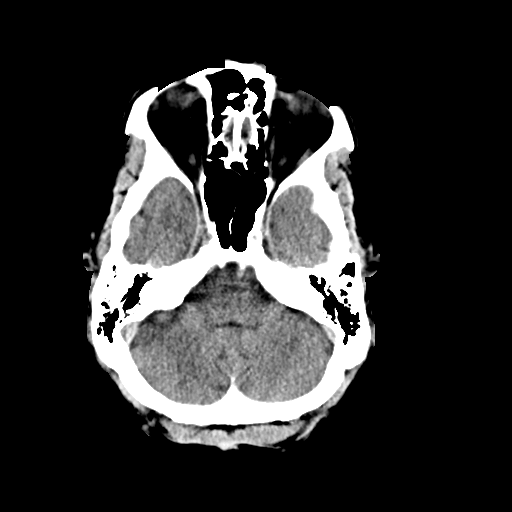
[im 8/32  brain]
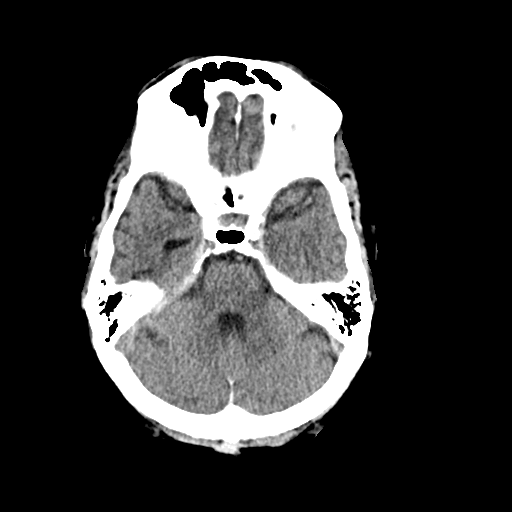
[im 9/32  brain]
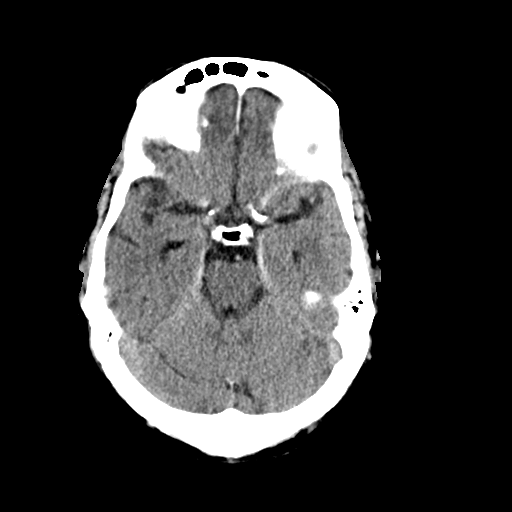
[im 9/32  bone]
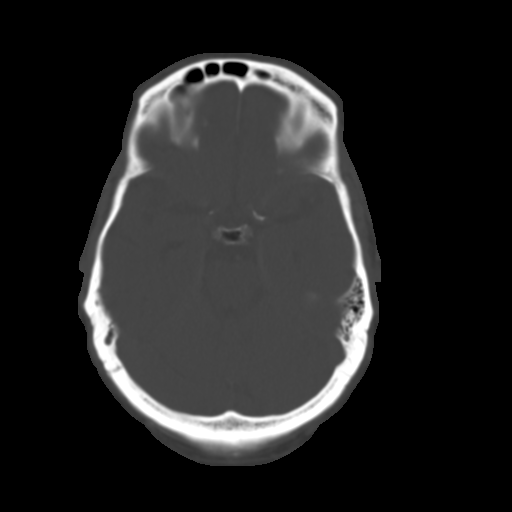
[im 11/32  brain]
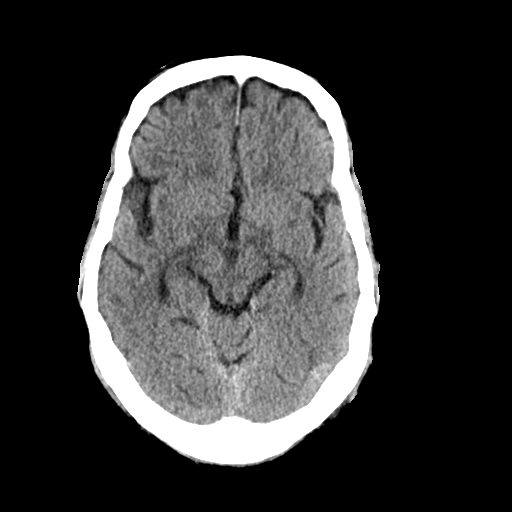
[im 13/32  brain]
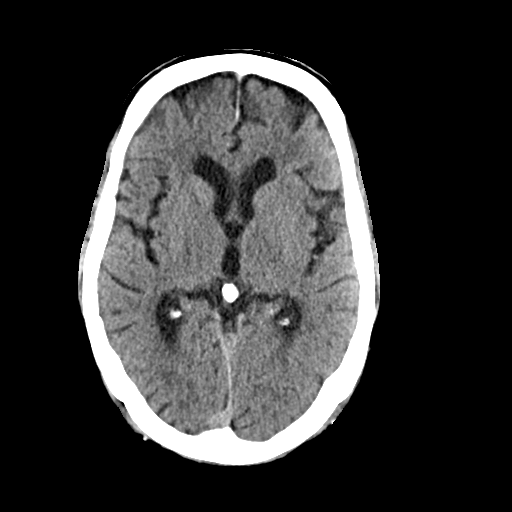
[im 15/32  brain]
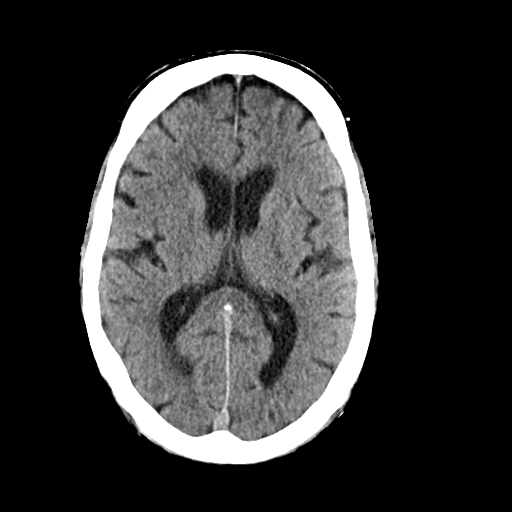
[im 17/32  brain]
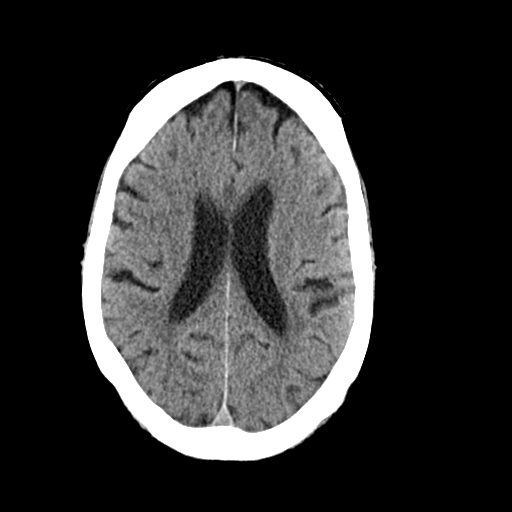
[im 17/32  bone]
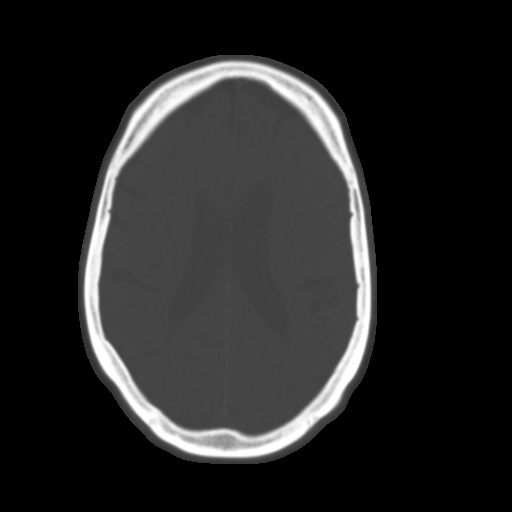
[im 19/32  brain]
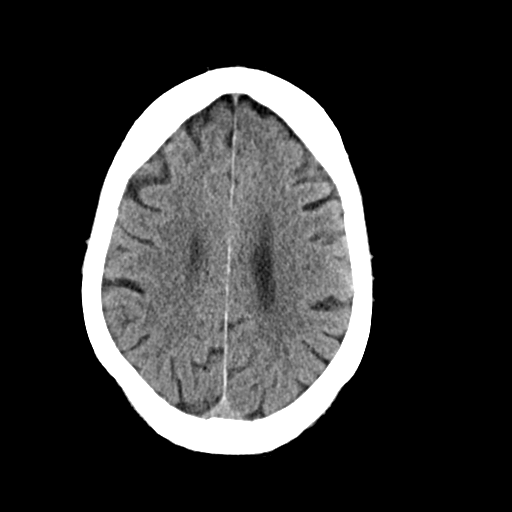
[im 21/32  brain]
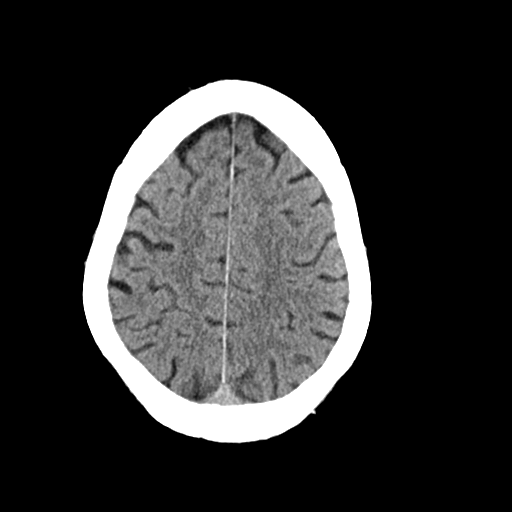
[im 23/32  brain]
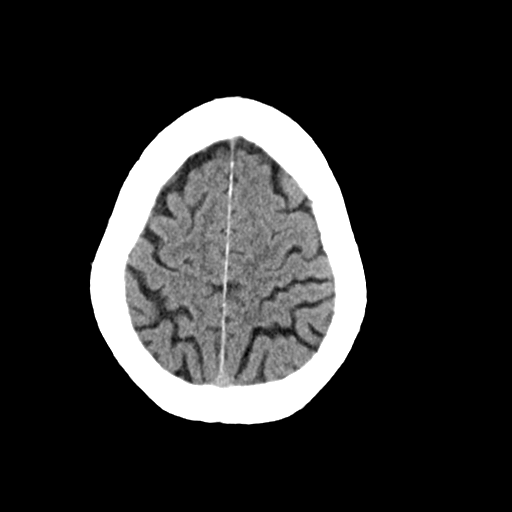
[im 24/32  brain]
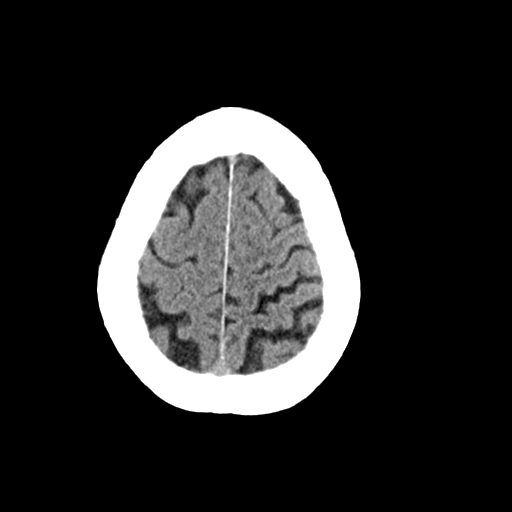
[im 24/32  bone]
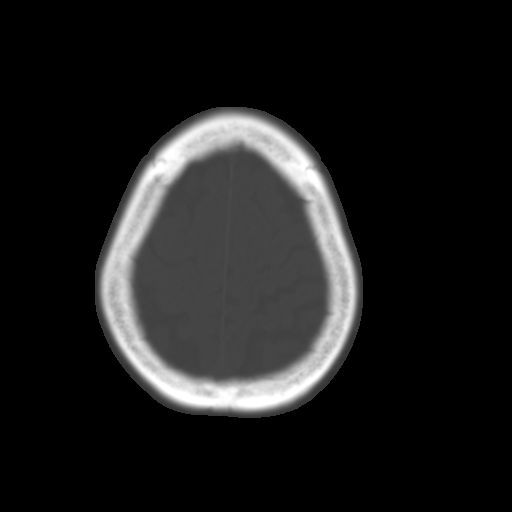
[im 26/32  brain]
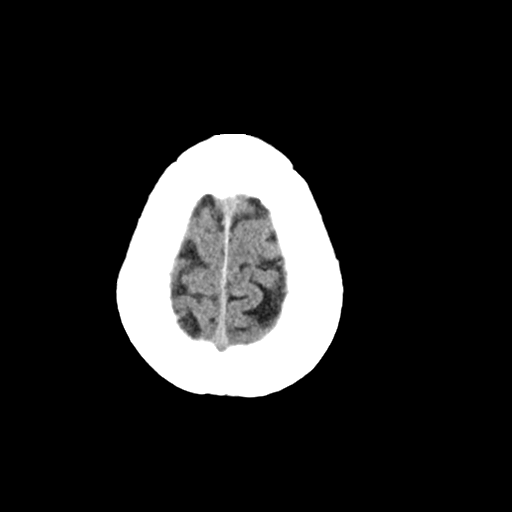
[im 28/32  brain]
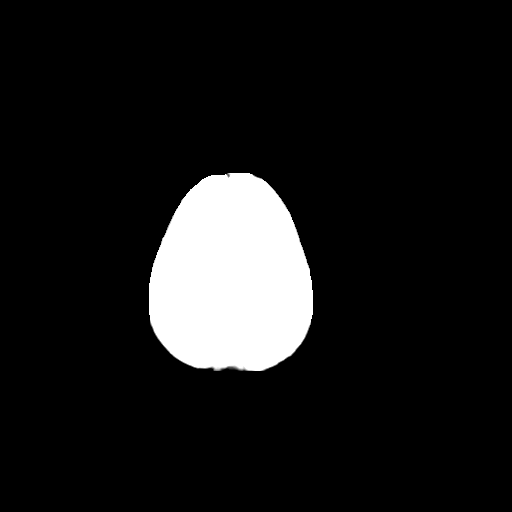
[im 30/32  brain]
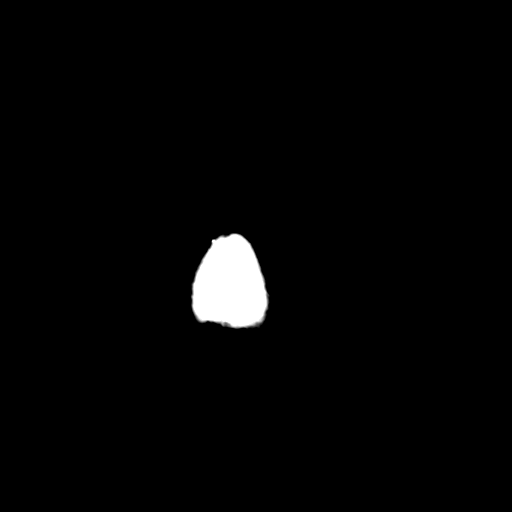

[16 of 30 positions shown; findings below may reference images not displayed]

FINDINGS: Brain: Brain volume is normal for age. No intracranial hemorrhage,
mass effect, or midline shift. No hydrocephalus. The basilar
cisterns are patent. Minor chronic small vessel ischemia, normal for
age. No evidence of territorial infarct or acute ischemia. No
extra-axial or intracranial fluid collection.

Vascular: Atherosclerosis of skullbase vasculature without
hyperdense vessel or abnormal calcification.

Skull: No fracture or focal lesion.

Sinuses/Orbits: Paranasal sinuses and mastoid air cells are clear.
The visualized orbits are unremarkable. Bilateral cataract
resection.

Other: None.
IMPRESSION: Negative noncontrast head CT for age.

## 2021-10-26 ENCOUNTER — Inpatient Hospital Stay: Payer: Medicare Other

## 2021-10-26 DIAGNOSIS — J40 Bronchitis, not specified as acute or chronic: Secondary | ICD-10-CM | POA: Diagnosis not present

## 2021-10-30 NOTE — Progress Notes (Signed)
'  Chief Complaint  Patient presents with   Follow-up    Dizziness, bradycardia   History of Present Illness: 81 yo male with history of DM, HTN and CAD here today for follow up. He was admitted to [facility] November 2018 with a NSTEMI. Cardiac cath November 2018 with severe stenosis in the mid LAD and mid RCA treated with 3 drug eluting stents in LAD and one drug eluting stent in the mid RCA. The posterolateral branch had a severe stenosis treated with balloon angioplasty. Echo November 2018 with normal LV systolic function. He was placed on ASA and Brilinta but admitted December 2018 with melena and chest pain. EGD showed a non bleeding ulcer and gastritis. ASA was stopped and Brilinta was continued.  He had severe abdominal pains while on a statin and stopped this. He has tolerated Zetia. He was seen in March 2020 by Ermalinda Barrios, PA and had c/o chest pain when walking in cold weather. Imdur was increased. Nuclear stress test 04/15/18 with no ischemia. He was admitted to [facility] September 2021 with chest pain. Cardiac cath 10/10/19 with patent LAD and RCA stents with moderate stenosis in the small Circumflex and moderate stenosis in the PDA. Also severe disease in an RV marginal branch. Normal LV function. Medical therapy recommended. He has since been on Plavix. I saw him in the office 09/12/21 and he was c/o dizziness with HR in the 50s at home. Norvasc was stopped at that visit due to hypotension. Echo 09/20/21 with LVEF=60-65%. Normal RV function. Mild MR. Cardiac monitor with sinus brady, lowest HR 45 bpm with no high grade AV block. One 4 beat run of SVT. Rare PACs/PVCs.   He is here today for follow up. The patient denies any chest pain, dyspnea, palpitations, lower extremity edema, orthopnea, PND, dizziness, near syncope or syncope. He does feel lightheaded daily. He has had a cold for a few weeks and po intake has been poor.    Primary Care Physician: Wenda Low, MD  Past Medical History:  Diagnosis  Date   Anticoagulated    plavix   Arthritis    Shoulder, knees, back    CAD in native artery cardiologist-  dr Angelena Form   a. CAD/NSTEMI ,  cardiac cath staged stenting-- 12-25-2016  PTCA and DES x3 to prox. and mid LAD;  12-26-2016  PCI to PLA and DES x1 to midRCA,  EF 60-65%.   Chronic lower back pain    CKD (chronic kidney disease), stage III (HCC)    Complication of anesthesia    "Too much with shoulder surgery", pt. reports that he was told the at they "lost him, due to absorbing too much anesthesia".  shoulder surgery 1985   DDD (degenerative disc disease), lumbosacral    GERD (gastroesophageal reflux disease)    History of gastric ulcer 01/2017   History of kidney stones    History of malignant melanoma    right side of nose   History of non-ST elevation myocardial infarction (NSTEMI) 12/23/2017   s/p  staged cardiac cath,  s/p  PCI and DEStenting   Hyperlipidemia    Hypertension    IDA (iron deficiency anemia)    Iron deficiency anemia due to chronic blood loss 10/05/2021   Myocardial infarction (HCC)    x 2   Neuromuscular disorder (HCC)    neuropathy   Nocturia    RBBB    Renal calculus, right    S/P drug eluting coronary stent placement 12-25-2017,  12-26-2017  PTCA and DES x3 to prox. and mid LAD;  PCI to PLA and DES x1 to midRCA   Type 2 diabetes mellitus treated with insulin ([place])    followed by pcp    Past Surgical History:  Procedure Laterality Date   ANAL FISSURE REPAIR  X 2   BACK SURGERY     CATARACT EXTRACTION W/ INTRAOCULAR LENS  IMPLANT, BILATERAL Bilateral 2017;  2015   COLONOSCOPY WITH PROPOFOL N/A 06/05/2016   Procedure: COLONOSCOPY WITH PROPOFOL;  Surgeon: Garlan Fair, MD;  Location: [facility];  Service: Endoscopy;  Laterality: N/A;   CONVERSION TO TOTAL KNEE Right 08/25/2020   Procedure: Revision right knee unicompartmental arthroplasty to total knee arthroplasty;  Surgeon: Gaynelle Arabian, MD;  Location: [facility];  Service: Orthopedics;   Laterality: Right;   CORONARY ANGIOGRAPHY N/A 12/26/2016   Procedure: CORONARY ANGIOGRAPHY;  Surgeon: Troy Sine, MD;  Location: [place] [facility];  Service: Cardiovascular;  Laterality: N/A;   CORONARY STENT INTERVENTION N/A 12/25/2016   Procedure: CORONARY STENT INTERVENTION;  Surgeon: Burnell Blanks, MD;  Location: [place] [facility];  Service: Cardiovascular;  Laterality: N/A;   CORONARY STENT INTERVENTION N/A 12/26/2016   Procedure: CORONARY STENT INTERVENTION;  Surgeon: Troy Sine, MD;  Location: [place] [facility];  Service: Cardiovascular;  Laterality: N/A;   CYSTOSCOPY/URETEROSCOPY/HOLMIUM LASER/STENT PLACEMENT Right 01/07/2018   Procedure: RIGHT URETEROSCOPY/HOLMIUM LASER/STENT PLACEMENT;  Surgeon: Lucas Mallow, MD;  Location: [facility];  Service: Urology;  Laterality: Right;   ESOPHAGOGASTRODUODENOSCOPY (EGD) WITH PROPOFOL N/A 01/12/2017   Procedure: ESOPHAGOGASTRODUODENOSCOPY (EGD) WITH PROPOFOL;  Surgeon: Wilford Corner, MD;  Location: [place];  Service: Endoscopy;  Laterality: N/A;   HAND TENDON SURGERY Right 10-29-2002   dr Burney Gauze '@[facility]'$    right index and thumb   KNEE ARTHROSCOPY Bilateral 2009-;2010   '@[facility]'$    LEFT HEART CATH AND CORONARY ANGIOGRAPHY N/A 12/25/2016   Procedure: LEFT HEART CATH AND CORONARY ANGIOGRAPHY;  Surgeon: Burnell Blanks, MD;  Location: [place] [facility];  Service: Cardiovascular;  Laterality: N/A;   LEFT HEART CATH AND CORONARY ANGIOGRAPHY N/A 10/10/2019   Procedure: LEFT HEART CATH AND CORONARY ANGIOGRAPHY;  Surgeon: Troy Sine, MD;  Location: [place] [facility];  Service: Cardiovascular;  Laterality: N/A;   LUMBAR LAMINECTOMY/DECOMPRESSION MICRODISCECTOMY N/A 06/09/2015   Procedure: Laminectomy - T12-L1;  Surgeon: Eustace Moore, MD;  Location: [place] [facility];  Service: Neurosurgery;  Laterality: N/A;  Laminectomy - T12-L1   MEDIAL PARTIAL KNEE REPLACEMENT Bilateral 2009-2010    [facility]     MELANOMA EXCISION Right    "side of my nose"   REVERSE SHOULDER ARTHROPLASTY Right 03/31/2021   Procedure: REVERSE SHOULDER ARTHROPLASTY;  Surgeon: Justice Britain, MD;  Location: [facility];  Service: Orthopedics;  Laterality: Right;  124mn   SHOULDER SURGERY Left 1985   TOTAL SHOULDER ARTHROPLASTY Left 12/06/2012   Procedure: LEFT TOTAL SHOULDER ARTHROPLASTY VERSES A REVERSE TOTAL SHOULDER ARTHROPLASTY;  Surgeon: SAugustin Schooling MD;  [place] [facility]  Service: Orthopedics;  Laterality: Left;    Current Outpatient Medications  Medication Sig Dispense Refill   acetaminophen (TYLENOL) 325 MG tablet Take 2 tablets (650 mg total) by mouth every 6 (six) hours as needed for mild pain or headache.     amoxicillin (AMOXIL) 500 MG capsule Take 2,000 mg by mouth See admin instructions. Take 2000 mg by mouth 1 hour prior to dental work     aspirin EC 81 MG tablet Take 1  tablet (81 mg total) by mouth daily. Swallow whole. 90 tablet 3   Continuous Blood Gluc Receiver (FREESTYLE LIBRE 2 READER) DEVI As directed 1 each 0   Continuous Blood Gluc Sensor (FREESTYLE LIBRE 2 SENSOR) MISC 1 Piece by Does not apply route every 14 (fourteen) days. 2 each 3   cyclobenzaprine (FLEXERIL) 10 MG tablet Take 1 tablet (10 mg total) by mouth 3 (three) times daily as needed for muscle spasms. 30 tablet 1   empagliflozin (JARDIANCE) 25 MG TABS tablet Take 12.5 mg by mouth daily.     ezetimibe (ZETIA) 10 MG tablet Take 1 tablet (10 mg total) by mouth daily. 90 tablet 3   HYDROmorphone (DILAUDID) 2 MG tablet Take 1 tablet (2 mg total) by mouth every 4 (four) hours as needed. 20 tablet 0   insulin aspart protamine- aspart (NOVOLOG MIX 70/30) (70-30) 100 UNIT/ML injection Inject 7 Units into the skin 2 (two) times daily with a meal.     iron polysaccharides (NIFEREX) 150 MG capsule Take 150 mg by mouth daily.     isosorbide mononitrate (IMDUR) 60 MG 24 hr tablet Take 60 mg by mouth daily.     losartan (COZAAR) 50 MG tablet Take 1  tablet (50 mg total) by mouth daily. (Patient taking differently: Take 50 mg by mouth at bedtime.) 90 tablet 1   metFORMIN (GLUCOPHAGE XR) 500 MG 24 hr tablet Take 1 tablet (500 mg total) by mouth daily with breakfast. 90 tablet 3   methocarbamol (ROBAXIN) 500 MG tablet Take 1 tablet (500 mg total) by mouth every 6 (six) hours as needed for muscle spasms. 40 tablet 0   metoprolol tartrate (LOPRESSOR) 25 MG tablet TAKE 1 TABLET BY MOUTH TWICE A DAY 180 tablet 1   nitroGLYCERIN (NITROSTAT) 0.4 MG SL tablet Place 1 tablet (0.4 mg total) under the tongue every 5 (five) minutes as needed for chest pain. 30 tablet 12   Omega-3 Fatty Acids (FISH OIL) 1000 MG CAPS Take by mouth daily.     ondansetron (ZOFRAN) 4 MG tablet Take 1 tablet (4 mg total) by mouth every 8 (eight) hours as needed for nausea or vomiting. 10 tablet 0   pantoprazole (PROTONIX) 40 MG tablet Take 40 mg by mouth daily.     rosuvastatin (CRESTOR) 10 MG tablet Take 5 mg by mouth at bedtime.     tamsulosin (FLOMAX) 0.4 MG CAPS capsule Take 0.4 mg by mouth daily after supper.     Vitamin D, Cholecalciferol, 25 MCG (1000 UT) CAPS Take 1,000 Units by mouth daily.      Zinc 50 MG CAPS Take 50 mg by mouth daily.     Alogliptin Benzoate 12.5 MG TABS Take 12.5 mg by mouth daily. 90 tablet 1   No current facility-administered medications for this visit.    Allergies  Allergen Reactions   Statins Other (See Comments)    Severe stomach pain.   Oxycodone Itching   Hydrocodone Other (See Comments)    "messes with my hearing"   Codeine Itching   Morphine And Related Itching    Social History   Socioeconomic History   Marital status: Married    Spouse name: Not on file   Number of children: Not on file   Years of education: Not on file   Highest education level: Not on file  Occupational History   Not on file  Tobacco Use   Smoking status: Never   Smokeless tobacco: Never  Vaping Use   Vaping  Use: Never used  Substance and Sexual  Activity   Alcohol use: No   Drug use: No   Sexual activity: Not Currently  Other Topics Concern   Not on file  Social History Narrative   Not on file   Social Determinants of Health   Financial Resource Strain: Not on file  Food Insecurity: Not on file  Transportation Needs: Not on file  Physical Activity: Not on file  Stress: Not on file  Social Connections: Not on file  Intimate Partner Violence: Not on file    Family History  Problem Relation Age of Onset   CAD Father    Heart failure Father    CAD Sister    Stroke Mother    Heart failure Mother     Review of Systems:  As stated in the HPI and otherwise negative.   BP (!) 146/86   Pulse 86   Ht '5\' 8"'$  (1.727 m)   Wt 174 lb 12.8 oz (79.3 kg)   SpO2 98%   BMI 26.58 kg/m   Physical Examination: General: Well developed, well nourished, NAD  HEENT: OP clear, mucus membranes moist  SKIN: warm, dry. No rashes. Neuro: No focal deficits  Musculoskeletal: Muscle strength 5/5 all ext  Psychiatric: Mood and affect normal  Neck: No JVD, no carotid bruits, no thyromegaly, no lymphadenopathy.  Lungs:Clear bilaterally, no wheezes, rhonci, crackles Cardiovascular: Regular rate and rhythm. No murmurs, gallops or rubs. Abdomen:Soft. Bowel sounds present. Non-tender.  Extremities: No lower extremity edema. Pulses are 2 + in the bilateral DP/PT.  Echo 09/20/21:  1. Left ventricular ejection fraction, by estimation, is 60 to 65%. The  left ventricle has normal function. The left ventricle has no regional  wall motion abnormalities. Left ventricular diastolic parameters are  consistent with Grade I diastolic  dysfunction (impaired relaxation). The average left ventricular global  longitudinal strain is -25.8 %. The global longitudinal strain is normal.   2. Right ventricular systolic function is normal. The right ventricular  size is normal. There is normal pulmonary artery systolic pressure.   3. Left atrial size was mildly  dilated.   4. The mitral valve is abnormal. Mild mitral valve regurgitation. No  evidence of mitral stenosis.   5. The aortic valve is tricuspid. There is mild calcification of the  aortic valve. Aortic valve regurgitation is not visualized. Aortic valve  sclerosis is present, with no evidence of aortic valve stenosis.   6. The inferior vena cava is normal in size with greater than 50%  respiratory variability, suggesting right atrial pressure of 3 mmHg.   EKG:  EKG is not ordered today The ekg ordered today demonstrates   Recent Labs: 11/11/2020: TSH 1.710 10/11/2021: ALT 20; BUN 15; Creatinine, Ser 1.25; Hemoglobin 12.1; Platelets 142; Potassium 4.1; Sodium 141   Lipid Panel    Component Value Date/Time   CHOL 182 11/11/2020 0855   TRIG 142 11/11/2020 0855   HDL 40 11/11/2020 0855   CHOLHDL 4.6 11/11/2020 0855   CHOLHDL 4.4 12/24/2016 0634   VLDL 13 12/24/2016 0634   LDLCALC 117 (H) 11/11/2020 0855     Wt Readings from Last 3 Encounters:  10/31/21 174 lb 12.8 oz (79.3 kg)  10/05/21 179 lb 10.8 oz (81.5 kg)  09/12/21 179 lb (81.2 kg)     Assessment and Plan:   1. CAD with angina: He has no recent chest pain concerning for angina. Drug eluting stents placed in the LAD and RCA in November 2018 and  CAD stable by cath in September 2021. He has normal LV systolic function. Continue ASA, statin, Zetia, beta blocker and Imdur   2. Hyperlipidemia: Lipids followed at the [place]. Continue statin and Zetia.    3. HTN: BP is controlled at home. Norvasc stopped at last visit.  Up slightly here today but his dizziness is improved. No changes today  4. Dizziness: Cardiac monitor with sinus bradycardia. No high grade AV block. Echo with normal LV systolic function. Dizziness is improved. No changes today.    Labs/ tests ordered today include:  No orders of the defined types were placed in this encounter.  Disposition:   F/U with me in 12 months  Signed, Lauree Chandler, MD 10/31/2021  11:33 AM    [facility] [address], [place], [place]  [address] Phone: [phone]; Fax: [phone]

## 2021-10-31 ENCOUNTER — Encounter: Payer: Self-pay | Admitting: Cardiovascular Disease

## 2021-10-31 ENCOUNTER — Ambulatory Visit: Payer: Medicare Other | Attending: Cardiovascular Disease | Admitting: Cardiovascular Disease

## 2021-10-31 VITALS — BP 146/86 | HR 86 | Ht 68.0 in | Wt 174.8 lb

## 2021-10-31 DIAGNOSIS — E785 Hyperlipidemia, unspecified: Secondary | ICD-10-CM

## 2021-10-31 DIAGNOSIS — R42 Dizziness and giddiness: Secondary | ICD-10-CM

## 2021-10-31 DIAGNOSIS — I10 Essential (primary) hypertension: Secondary | ICD-10-CM | POA: Diagnosis not present

## 2021-10-31 DIAGNOSIS — I25118 Atherosclerotic heart disease of native coronary artery with other forms of angina pectoris: Secondary | ICD-10-CM

## 2021-10-31 NOTE — Patient Instructions (Signed)
Medication Instructions:  No changes *If you need a refill on your cardiac medications before your next appointment, please call your pharmacy*   Lab Work: none If you have labs (blood work) drawn today and your tests are completely normal, you will receive your results only by: [place] (if you have MyChart) OR A paper copy in the mail If you have any lab test that is abnormal or we need to change your treatment, we will call you to review the results.   Testing/Procedures: none   Follow-Up: At [facility], you and your health needs are our priority.  As part of our continuing mission to provide you with exceptional heart care, we have created designated Provider Care Teams.  These Care Teams include your primary Cardiologist (physician) and Advanced Practice Providers (APPs -  Physician Assistants and Nurse Practitioners) who all work together to provide you with the care you need, when you need it.   Your next appointment:   6 month(s)  The format for your next appointment:   In Person  Provider:   Lauree Chandler, MD      Important Information About Sugar

## 2021-11-02 ENCOUNTER — Inpatient Hospital Stay: Payer: Medicare Other

## 2021-11-09 ENCOUNTER — Inpatient Hospital Stay: Payer: Medicare Other

## 2021-11-18 ENCOUNTER — Ambulatory Visit: Payer: Medicare Other

## 2021-11-28 ENCOUNTER — Other Ambulatory Visit: Payer: Self-pay | Admitting: Physician Assistant

## 2021-11-28 DIAGNOSIS — D500 Iron deficiency anemia secondary to blood loss (chronic): Secondary | ICD-10-CM

## 2021-11-30 ENCOUNTER — Ambulatory Visit: Payer: Medicare Other | Admitting: "Endocrinology

## 2021-12-05 ENCOUNTER — Inpatient Hospital Stay: Payer: Medicare Other

## 2021-12-05 ENCOUNTER — Ambulatory Visit: Payer: Medicare Other | Admitting: Physician Assistant

## 2022-02-07 DIAGNOSIS — I10 Essential (primary) hypertension: Secondary | ICD-10-CM | POA: Diagnosis not present

## 2022-02-07 DIAGNOSIS — Z0000 Encounter for general adult medical examination without abnormal findings: Secondary | ICD-10-CM | POA: Diagnosis not present

## 2022-02-07 DIAGNOSIS — N1831 Chronic kidney disease, stage 3a: Secondary | ICD-10-CM | POA: Diagnosis not present

## 2022-02-07 DIAGNOSIS — M48061 Spinal stenosis, lumbar region without neurogenic claudication: Secondary | ICD-10-CM | POA: Diagnosis not present

## 2022-02-07 DIAGNOSIS — I252 Old myocardial infarction: Secondary | ICD-10-CM | POA: Diagnosis not present

## 2022-02-07 DIAGNOSIS — I739 Peripheral vascular disease, unspecified: Secondary | ICD-10-CM | POA: Diagnosis not present

## 2022-02-07 DIAGNOSIS — E1142 Type 2 diabetes mellitus with diabetic polyneuropathy: Secondary | ICD-10-CM | POA: Diagnosis not present

## 2022-02-07 DIAGNOSIS — E7800 Pure hypercholesterolemia, unspecified: Secondary | ICD-10-CM | POA: Diagnosis not present

## 2022-02-07 DIAGNOSIS — I2510 Atherosclerotic heart disease of native coronary artery without angina pectoris: Secondary | ICD-10-CM | POA: Diagnosis not present

## 2022-02-07 DIAGNOSIS — G720 Drug-induced myopathy: Secondary | ICD-10-CM | POA: Diagnosis not present

## 2022-02-07 DIAGNOSIS — E1122 Type 2 diabetes mellitus with diabetic chronic kidney disease: Secondary | ICD-10-CM | POA: Diagnosis not present

## 2022-03-15 ENCOUNTER — Telehealth: Payer: Self-pay | Admitting: *Deleted

## 2022-03-15 NOTE — Progress Notes (Signed)
  Care Coordination  Outreach Note  03/15/2022 Name: PRYNCE JACOBER MRN: [id number] DOB: 13-Feb-1940   Care Coordination Outreach Attempts: An unsuccessful telephone outreach was attempted today to offer the patient information about available care coordination services as a benefit of their health plan.   Follow Up Plan:  Additional outreach attempts will be made to offer the patient care coordination information and services.   Encounter Outcome:  No Answer  [place]  Direct Dial: [phone]

## 2022-03-18 DIAGNOSIS — Z885 Allergy status to narcotic agent status: Secondary | ICD-10-CM | POA: Diagnosis not present

## 2022-03-18 DIAGNOSIS — Q438 Other specified congenital malformations of intestine: Secondary | ICD-10-CM | POA: Diagnosis not present

## 2022-03-18 DIAGNOSIS — Z7982 Long term (current) use of aspirin: Secondary | ICD-10-CM | POA: Diagnosis not present

## 2022-03-18 DIAGNOSIS — Z7902 Long term (current) use of antithrombotics/antiplatelets: Secondary | ICD-10-CM | POA: Diagnosis not present

## 2022-03-18 DIAGNOSIS — E1165 Type 2 diabetes mellitus with hyperglycemia: Secondary | ICD-10-CM | POA: Diagnosis not present

## 2022-03-18 DIAGNOSIS — R1110 Vomiting, unspecified: Secondary | ICD-10-CM | POA: Diagnosis not present

## 2022-03-18 DIAGNOSIS — I701 Atherosclerosis of renal artery: Secondary | ICD-10-CM | POA: Diagnosis not present

## 2022-03-18 DIAGNOSIS — I2510 Atherosclerotic heart disease of native coronary artery without angina pectoris: Secondary | ICD-10-CM | POA: Diagnosis not present

## 2022-03-18 DIAGNOSIS — E119 Type 2 diabetes mellitus without complications: Secondary | ICD-10-CM | POA: Diagnosis not present

## 2022-03-18 DIAGNOSIS — Z87442 Personal history of urinary calculi: Secondary | ICD-10-CM | POA: Diagnosis not present

## 2022-03-18 DIAGNOSIS — Z794 Long term (current) use of insulin: Secondary | ICD-10-CM | POA: Diagnosis not present

## 2022-03-18 DIAGNOSIS — N138 Other obstructive and reflux uropathy: Secondary | ICD-10-CM | POA: Diagnosis not present

## 2022-03-18 DIAGNOSIS — I4510 Unspecified right bundle-branch block: Secondary | ICD-10-CM | POA: Diagnosis not present

## 2022-03-18 DIAGNOSIS — N200 Calculus of kidney: Secondary | ICD-10-CM | POA: Diagnosis not present

## 2022-03-18 DIAGNOSIS — Z7984 Long term (current) use of oral hypoglycemic drugs: Secondary | ICD-10-CM | POA: Diagnosis not present

## 2022-03-18 DIAGNOSIS — N139 Obstructive and reflux uropathy, unspecified: Secondary | ICD-10-CM | POA: Diagnosis not present

## 2022-03-18 DIAGNOSIS — I252 Old myocardial infarction: Secondary | ICD-10-CM | POA: Diagnosis not present

## 2022-03-18 DIAGNOSIS — E785 Hyperlipidemia, unspecified: Secondary | ICD-10-CM | POA: Diagnosis not present

## 2022-03-18 DIAGNOSIS — J9811 Atelectasis: Secondary | ICD-10-CM | POA: Diagnosis not present

## 2022-03-18 DIAGNOSIS — R079 Chest pain, unspecified: Secondary | ICD-10-CM | POA: Diagnosis not present

## 2022-03-18 DIAGNOSIS — N1330 Unspecified hydronephrosis: Secondary | ICD-10-CM | POA: Diagnosis not present

## 2022-03-18 DIAGNOSIS — R9431 Abnormal electrocardiogram [ECG] [EKG]: Secondary | ICD-10-CM | POA: Diagnosis not present

## 2022-03-18 DIAGNOSIS — I700 Atherosclerosis of aorta: Secondary | ICD-10-CM | POA: Diagnosis not present

## 2022-03-18 DIAGNOSIS — I10 Essential (primary) hypertension: Secondary | ICD-10-CM | POA: Diagnosis not present

## 2022-03-18 DIAGNOSIS — G709 Myoneural disorder, unspecified: Secondary | ICD-10-CM | POA: Diagnosis not present

## 2022-03-18 DIAGNOSIS — N132 Hydronephrosis with renal and ureteral calculous obstruction: Secondary | ICD-10-CM | POA: Diagnosis not present

## 2022-03-20 ENCOUNTER — Emergency Department (HOSPITAL_COMMUNITY): Payer: Medicare Other

## 2022-03-20 ENCOUNTER — Observation Stay (HOSPITAL_COMMUNITY)
Admission: EM | Admit: 2022-03-20 | Discharge: 2022-03-21 | Disposition: A | Discharge status: Home / Self Care | Payer: Medicare Other | Attending: Family Medicine | Admitting: Family Medicine

## 2022-03-20 ENCOUNTER — Inpatient Hospital Stay (HOSPITAL_COMMUNITY): Payer: Medicare Other

## 2022-03-20 ENCOUNTER — Encounter (HOSPITAL_COMMUNITY): Payer: Self-pay | Admitting: Emergency Medicine

## 2022-03-20 ENCOUNTER — Other Ambulatory Visit: Payer: Self-pay

## 2022-03-20 DIAGNOSIS — I2510 Atherosclerotic heart disease of native coronary artery without angina pectoris: Secondary | ICD-10-CM | POA: Insufficient documentation

## 2022-03-20 DIAGNOSIS — R4182 Altered mental status, unspecified: Principal | ICD-10-CM

## 2022-03-20 DIAGNOSIS — E860 Dehydration: Secondary | ICD-10-CM | POA: Insufficient documentation

## 2022-03-20 DIAGNOSIS — Z85820 Personal history of malignant melanoma of skin: Secondary | ICD-10-CM | POA: Insufficient documentation

## 2022-03-20 DIAGNOSIS — Z79899 Other long term (current) drug therapy: Secondary | ICD-10-CM | POA: Diagnosis not present

## 2022-03-20 DIAGNOSIS — N179 Acute kidney failure, unspecified: Secondary | ICD-10-CM | POA: Diagnosis not present

## 2022-03-20 DIAGNOSIS — Z794 Long term (current) use of insulin: Secondary | ICD-10-CM | POA: Diagnosis not present

## 2022-03-20 DIAGNOSIS — Z7982 Long term (current) use of aspirin: Secondary | ICD-10-CM | POA: Insufficient documentation

## 2022-03-20 DIAGNOSIS — E1151 Type 2 diabetes mellitus with diabetic peripheral angiopathy without gangrene: Secondary | ICD-10-CM | POA: Diagnosis not present

## 2022-03-20 DIAGNOSIS — E1159 Type 2 diabetes mellitus with other circulatory complications: Secondary | ICD-10-CM | POA: Insufficient documentation

## 2022-03-20 DIAGNOSIS — E785 Hyperlipidemia, unspecified: Secondary | ICD-10-CM | POA: Diagnosis not present

## 2022-03-20 DIAGNOSIS — I639 Cerebral infarction, unspecified: Secondary | ICD-10-CM | POA: Diagnosis not present

## 2022-03-20 DIAGNOSIS — G928 Other toxic encephalopathy: Secondary | ICD-10-CM | POA: Insufficient documentation

## 2022-03-20 DIAGNOSIS — E1169 Type 2 diabetes mellitus with other specified complication: Secondary | ICD-10-CM | POA: Diagnosis not present

## 2022-03-20 DIAGNOSIS — G9341 Metabolic encephalopathy: Secondary | ICD-10-CM | POA: Diagnosis not present

## 2022-03-20 DIAGNOSIS — Z7902 Long term (current) use of antithrombotics/antiplatelets: Secondary | ICD-10-CM | POA: Insufficient documentation

## 2022-03-20 DIAGNOSIS — Z8673 Personal history of transient ischemic attack (TIA), and cerebral infarction without residual deficits: Secondary | ICD-10-CM | POA: Insufficient documentation

## 2022-03-20 DIAGNOSIS — I129 Hypertensive chronic kidney disease with stage 1 through stage 4 chronic kidney disease, or unspecified chronic kidney disease: Secondary | ICD-10-CM | POA: Diagnosis not present

## 2022-03-20 DIAGNOSIS — I10 Essential (primary) hypertension: Secondary | ICD-10-CM | POA: Diagnosis not present

## 2022-03-20 DIAGNOSIS — I25119 Atherosclerotic heart disease of native coronary artery with unspecified angina pectoris: Secondary | ICD-10-CM | POA: Diagnosis not present

## 2022-03-20 DIAGNOSIS — Z96612 Presence of left artificial shoulder joint: Secondary | ICD-10-CM | POA: Diagnosis not present

## 2022-03-20 DIAGNOSIS — E782 Mixed hyperlipidemia: Secondary | ICD-10-CM | POA: Diagnosis present

## 2022-03-20 DIAGNOSIS — I6523 Occlusion and stenosis of bilateral carotid arteries: Secondary | ICD-10-CM | POA: Diagnosis not present

## 2022-03-20 DIAGNOSIS — I252 Old myocardial infarction: Secondary | ICD-10-CM | POA: Insufficient documentation

## 2022-03-20 DIAGNOSIS — R4701 Aphasia: Principal | ICD-10-CM | POA: Insufficient documentation

## 2022-03-20 DIAGNOSIS — N200 Calculus of kidney: Secondary | ICD-10-CM | POA: Diagnosis not present

## 2022-03-20 DIAGNOSIS — Z96611 Presence of right artificial shoulder joint: Secondary | ICD-10-CM | POA: Diagnosis not present

## 2022-03-20 DIAGNOSIS — Z7984 Long term (current) use of oral hypoglycemic drugs: Secondary | ICD-10-CM | POA: Diagnosis not present

## 2022-03-20 DIAGNOSIS — N183 Chronic kidney disease, stage 3 unspecified: Secondary | ICD-10-CM | POA: Diagnosis not present

## 2022-03-20 DIAGNOSIS — G459 Transient cerebral ischemic attack, unspecified: Secondary | ICD-10-CM

## 2022-03-20 DIAGNOSIS — Z96653 Presence of artificial knee joint, bilateral: Secondary | ICD-10-CM | POA: Insufficient documentation

## 2022-03-20 DIAGNOSIS — Z9861 Coronary angioplasty status: Secondary | ICD-10-CM | POA: Diagnosis not present

## 2022-03-20 DIAGNOSIS — E1122 Type 2 diabetes mellitus with diabetic chronic kidney disease: Secondary | ICD-10-CM | POA: Insufficient documentation

## 2022-03-20 DIAGNOSIS — I6381 Other cerebral infarction due to occlusion or stenosis of small artery: Secondary | ICD-10-CM | POA: Diagnosis not present

## 2022-03-20 DIAGNOSIS — G319 Degenerative disease of nervous system, unspecified: Secondary | ICD-10-CM | POA: Diagnosis not present

## 2022-03-20 LAB — DIFFERENTIAL
Abs Immature Granulocytes: 0.02 10*3/uL (ref 0.00–0.07)
Basophils Absolute: 0 10*3/uL (ref 0.0–0.1)
Basophils Relative: 0 %
Eosinophils Absolute: 0 10*3/uL (ref 0.0–0.5)
Eosinophils Relative: 0 %
Immature Granulocytes: 0 %
Lymphocytes Relative: 16 %
Lymphs Abs: 1.3 10*3/uL (ref 0.7–4.0)
Monocytes Absolute: 0.7 10*3/uL (ref 0.1–1.0)
Monocytes Relative: 8 %
Neutro Abs: 6.1 10*3/uL (ref 1.7–7.7)
Neutrophils Relative %: 76 %

## 2022-03-20 LAB — HEMOGLOBIN A1C
Hgb A1c MFr Bld: 7.6 % — ABNORMAL HIGH (ref 4.8–5.6)
Mean Plasma Glucose: 171.42 mg/dL

## 2022-03-20 LAB — PROTIME-INR
INR: 1 (ref 0.8–1.2)
Prothrombin Time: 13.1 seconds (ref 11.4–15.2)

## 2022-03-20 LAB — RAPID URINE DRUG SCREEN, HOSP PERFORMED
Amphetamines: NOT DETECTED
Barbiturates: NOT DETECTED
Benzodiazepines: NOT DETECTED
Cocaine: NOT DETECTED
Opiates: POSITIVE — AB
Tetrahydrocannabinol: NOT DETECTED

## 2022-03-20 LAB — I-STAT CHEM 8, ED
BUN: 19 mg/dL (ref 8–23)
Calcium, Ion: 0.98 mmol/L — ABNORMAL LOW (ref 1.15–1.40)
Chloride: 101 mmol/L (ref 98–111)
Creatinine, Ser: 1.6 mg/dL — ABNORMAL HIGH (ref 0.61–1.24)
Glucose, Bld: 137 mg/dL — ABNORMAL HIGH (ref 70–99)
HCT: 35 % — ABNORMAL LOW (ref 39.0–52.0)
Hemoglobin: 11.9 g/dL — ABNORMAL LOW (ref 13.0–17.0)
Potassium: 3.4 mmol/L — ABNORMAL LOW (ref 3.5–5.1)
Sodium: 138 mmol/L (ref 135–145)
TCO2: 23 mmol/L (ref 22–32)

## 2022-03-20 LAB — CBC
HCT: 45.2 % (ref 39.0–52.0)
Hemoglobin: 15.3 g/dL (ref 13.0–17.0)
MCH: 30.1 pg (ref 26.0–34.0)
MCHC: 33.8 g/dL (ref 30.0–36.0)
MCV: 89 fL (ref 80.0–100.0)
Platelets: 139 10*3/uL — ABNORMAL LOW (ref 150–400)
RBC: 5.08 MIL/uL (ref 4.22–5.81)
RDW: 12.4 % (ref 11.5–15.5)
WBC: 8.1 10*3/uL (ref 4.0–10.5)
nRBC: 0 % (ref 0.0–0.2)

## 2022-03-20 LAB — URINALYSIS, ROUTINE W REFLEX MICROSCOPIC
Bacteria, UA: NONE SEEN
Bilirubin Urine: NEGATIVE
Glucose, UA: NEGATIVE mg/dL
Hgb urine dipstick: NEGATIVE
Ketones, ur: NEGATIVE mg/dL
Leukocytes,Ua: NEGATIVE
Nitrite: NEGATIVE
Protein, ur: NEGATIVE mg/dL
Specific Gravity, Urine: 1.035 — ABNORMAL HIGH (ref 1.005–1.030)
pH: 5 (ref 5.0–8.0)

## 2022-03-20 LAB — COMPREHENSIVE METABOLIC PANEL
ALT: 19 U/L (ref 0–44)
AST: 16 U/L (ref 15–41)
Albumin: 4 g/dL (ref 3.5–5.0)
Alkaline Phosphatase: 83 U/L (ref 38–126)
Anion gap: 9 (ref 5–15)
BUN: 23 mg/dL (ref 8–23)
CO2: 27 mmol/L (ref 22–32)
Calcium: 8.9 mg/dL (ref 8.9–10.3)
Chloride: 96 mmol/L — ABNORMAL LOW (ref 98–111)
Creatinine, Ser: 2.01 mg/dL — ABNORMAL HIGH (ref 0.61–1.24)
GFR, Estimated: 33 mL/min — ABNORMAL LOW (ref >60)
Glucose, Bld: 180 mg/dL — ABNORMAL HIGH (ref 70–99)
Potassium: 3.8 mmol/L (ref 3.5–5.1)
Sodium: 132 mmol/L — ABNORMAL LOW (ref 135–145)
Total Bilirubin: 1.4 mg/dL — ABNORMAL HIGH (ref 0.3–1.2)
Total Protein: 7.4 g/dL (ref 6.5–8.1)

## 2022-03-20 LAB — ETHANOL: Alcohol, Ethyl (B): <10 mg/dL (ref <10)

## 2022-03-20 LAB — APTT: aPTT: 25 seconds (ref 24–36)

## 2022-03-20 LAB — CBG MONITORING, ED: Glucose-Capillary: 166 mg/dL — ABNORMAL HIGH (ref 70–99)

## 2022-03-20 MED ORDER — PANTOPRAZOLE SODIUM 40 MG PO TBEC
40.0000 mg | DELAYED_RELEASE_TABLET | Freq: Every day | ORAL | Status: DC
  Administered 2022-03-20 – 2022-03-21 (×2): 40 mg via ORAL
  Filled 2022-03-20 (×2): qty 1

## 2022-03-20 MED ORDER — ONDANSETRON HCL 4 MG PO TABS: 4.0000 mg | ORAL_TABLET | Freq: Four times a day (QID) | ORAL | Status: DC | PRN

## 2022-03-20 MED ORDER — STROKE: EARLY STAGES OF RECOVERY BOOK
Freq: Once | Status: AC
  Filled 2022-03-20: qty 1

## 2022-03-20 MED ORDER — ASPIRIN 325 MG PO TABS
325.0000 mg | ORAL_TABLET | Freq: Once | ORAL | Status: AC
  Administered 2022-03-20: 325 mg via ORAL
  Filled 2022-03-20: qty 1

## 2022-03-20 MED ORDER — SODIUM CHLORIDE 0.9 % IV BOLUS
1000.0000 mL | Freq: Once | INTRAVENOUS | Status: AC
  Administered 2022-03-20: 1000 mL via INTRAVENOUS

## 2022-03-20 MED ORDER — INSULIN ASPART 100 UNIT/ML IJ SOLN: 0.0000 [IU] | Freq: Three times a day (TID) | INTRAMUSCULAR | Status: DC

## 2022-03-20 MED ORDER — METFORMIN HCL ER 500 MG PO TB24
500.0000 mg | ORAL_TABLET | Freq: Every day | ORAL | Status: DC
  Filled 2022-03-20: qty 1

## 2022-03-20 MED ORDER — TAMSULOSIN HCL 0.4 MG PO CAPS: 0.4000 mg | ORAL_CAPSULE | Freq: Every day | ORAL | Status: DC

## 2022-03-20 MED ORDER — EMPAGLIFLOZIN 10 MG PO TABS
10.0000 mg | ORAL_TABLET | Freq: Every day | ORAL | Status: DC
  Administered 2022-03-21: 10 mg via ORAL
  Filled 2022-03-20: qty 1

## 2022-03-20 MED ORDER — CLOPIDOGREL BISULFATE 75 MG PO TABS
75.0000 mg | ORAL_TABLET | Freq: Every day | ORAL | Status: DC
  Administered 2022-03-20 – 2022-03-21 (×2): 75 mg via ORAL
  Filled 2022-03-20 (×2): qty 1

## 2022-03-20 MED ORDER — ACETAMINOPHEN 650 MG RE SUPP: 650.0000 mg | Freq: Four times a day (QID) | RECTAL | Status: DC | PRN

## 2022-03-20 MED ORDER — SODIUM CHLORIDE (PF) 0.9 % IJ SOLN
INTRAMUSCULAR | Status: AC
  Filled 2022-03-20: qty 50

## 2022-03-20 MED ORDER — INSULIN ASPART PROT & ASPART (70-30 MIX) 100 UNIT/ML ~~LOC~~ SUSP
10.0000 [IU] | Freq: Two times a day (BID) | SUBCUTANEOUS | Status: DC
  Filled 2022-03-20: qty 10

## 2022-03-20 MED ORDER — ROSUVASTATIN CALCIUM 5 MG PO TABS
5.0000 mg | ORAL_TABLET | Freq: Every day | ORAL | Status: DC
  Administered 2022-03-20: 5 mg via ORAL
  Filled 2022-03-20: qty 1

## 2022-03-20 MED ORDER — EZETIMIBE 10 MG PO TABS
10.0000 mg | ORAL_TABLET | Freq: Every day | ORAL | Status: DC
  Administered 2022-03-20 – 2022-03-21 (×2): 10 mg via ORAL
  Filled 2022-03-20 (×2): qty 1

## 2022-03-20 MED ORDER — ONDANSETRON HCL 4 MG/2ML IJ SOLN: 4.0000 mg | Freq: Four times a day (QID) | INTRAMUSCULAR | Status: DC | PRN

## 2022-03-20 MED ORDER — IOHEXOL 350 MG/ML SOLN
100.0000 mL | Freq: Once | INTRAVENOUS | Status: AC | PRN
  Administered 2022-03-20: 125 mL via INTRAVENOUS

## 2022-03-20 MED ORDER — ASPIRIN 81 MG PO TBEC
81.0000 mg | DELAYED_RELEASE_TABLET | Freq: Every day | ORAL | Status: DC
  Administered 2022-03-21: 81 mg via ORAL
  Filled 2022-03-20: qty 1

## 2022-03-20 MED ORDER — ACETAMINOPHEN 325 MG PO TABS: 650.0000 mg | ORAL_TABLET | Freq: Four times a day (QID) | ORAL | Status: DC | PRN

## 2022-03-20 NOTE — Plan of Care (Signed)
  Problem: Education: Goal: Knowledge of disease or condition will improve Outcome: Progressing Goal: Knowledge of secondary prevention will improve (MUST DOCUMENT ALL) Outcome: Progressing Goal: Knowledge of patient specific risk factors will improve Matthew [name] N/A or DELETE if not current risk factor) Outcome: Progressing   Problem: Ischemic Stroke/TIA Tissue Perfusion: Goal: Complications of ischemic stroke/TIA will be minimized Outcome: Progressing   Problem: Coping: Goal: Will verbalize positive feelings about self Outcome: Progressing Goal: Will identify appropriate support needs Outcome: Progressing   Problem: Health Behavior/Discharge Planning: Goal: Ability to manage health-related needs will improve Outcome: Progressing Goal: Goals will be collaboratively established with patient/family Outcome: Progressing   Problem: Self-Care: Goal: Ability to participate in self-care as condition permits will improve Outcome: Progressing Goal: Verbalization of feelings and concerns over difficulty with self-care will improve Outcome: Progressing Goal: Ability to communicate needs accurately will improve Outcome: Progressing   Problem: Nutrition: Goal: Risk of aspiration will decrease Outcome: Progressing Goal: Dietary intake will improve Outcome: Progressing   Problem: Education: Goal: Knowledge of General Education information will improve Description: Including pain rating scale, medication(s)/side effects and non-pharmacologic comfort measures Outcome: Progressing   Problem: Health Behavior/Discharge Planning: Goal: Ability to manage health-related needs will improve Outcome: Progressing   Problem: Clinical Measurements: Goal: Ability to maintain clinical measurements within normal limits will improve Outcome: Progressing Goal: Will remain free from infection Outcome: Progressing Goal: Diagnostic test results will improve Outcome: Progressing Goal: Respiratory  complications will improve Outcome: Progressing Goal: Cardiovascular complication will be avoided Outcome: Progressing   Problem: Activity: Goal: Risk for activity intolerance will decrease Outcome: Progressing   Problem: Nutrition: Goal: Adequate nutrition will be maintained Outcome: Progressing   Problem: Coping: Goal: Level of anxiety will decrease Outcome: Progressing   Problem: Elimination: Goal: Will not experience complications related to bowel motility Outcome: Progressing Goal: Will not experience complications related to urinary retention Outcome: Progressing   Problem: Pain Managment: Goal: General experience of comfort will improve Outcome: Progressing   Problem: Safety: Goal: Ability to remain free from injury will improve Outcome: Progressing   Problem: Skin Integrity: Goal: Risk for impaired skin integrity will decrease Outcome: Progressing   Problem: Education: Goal: Ability to describe self-care measures that may prevent or decrease complications (Diabetes Survival Skills Education) will improve Outcome: Progressing Goal: Individualized Educational Video(s) Outcome: Progressing   Problem: Coping: Goal: Ability to adjust to condition or change in health will improve Outcome: Progressing   Problem: Fluid Volume: Goal: Ability to maintain a balanced intake and output will improve Outcome: Progressing   Problem: Health Behavior/Discharge Planning: Goal: Ability to identify and utilize available resources and services will improve Outcome: Progressing Goal: Ability to manage health-related needs will improve Outcome: Progressing   Problem: Metabolic: Goal: Ability to maintain appropriate glucose levels will improve Outcome: Progressing   Problem: Nutritional: Goal: Maintenance of adequate nutrition will improve Outcome: Progressing Goal: Progress toward achieving an optimal weight will improve Outcome: Progressing   Problem: Skin  Integrity: Goal: Risk for impaired skin integrity will decrease Outcome: Progressing   Problem: Tissue Perfusion: Goal: Adequacy of tissue perfusion will improve Outcome: Progressing

## 2022-03-20 NOTE — Progress Notes (Signed)
[place] 1014  Dr. Quinn Axe paged [phone] Pt to CT 1023 Dr. Quinn Axe on camera 1024 Pt evaluated in scanner by Dr. Isa Rankin 2/11 1800  Per wife pt went to bed around 1800 2/11 and was his normal self. Pt woke up around 0830 and was unable to speak. Pt presents with aphasia and is not following commands at this time.

## 2022-03-20 NOTE — ED Triage Notes (Signed)
Brought in by wife with AMS, dx kidney stone on Sat. Pt confused and unable to answer question. Pt not looking to the right. Last seen normal at 0900.

## 2022-03-20 NOTE — ED Provider Notes (Signed)
[facility] Provider Note  CSN: [id number] Arrival date & time: 03/20/22 1001  Chief Complaint(s) Altered Mental Status  HPI Matthew [name] is a 82 y.o. male with past medical history as below, significant for CKD, HTN, HLD, IDA, CAD who presents to the ED with complaint of AMS.  Patient arrives with spouse, reports that he went to bed around 6 PM or so yesterday evening was in his normal state of health that time.  She woke up around 8:30 AM this morning and he had difficulty speaking, she checked on him again around 9 AM and he continued have difficulty speaking so she brought him to the emergency department.  Patient is recent diagnosed with kidney stones are on Flomax.  No other recent medication or diet changes.  No recent falls per family member.  Patient unable contribute history secondary to profound aphasia.  Patient is repeating "yeah" repeatedly asked questions   Activated as stroke alert on arrival  Past Medical History Past Medical History:  Diagnosis Date   Anticoagulated    plavix   Arthritis    Shoulder, knees, back    CAD in native artery cardiologist-  dr Angelena Form   a. CAD/NSTEMI ,  cardiac cath staged stenting-- 12-25-2016  PTCA and DES x3 to prox. and mid LAD;  12-26-2016  PCI to PLA and DES x1 to midRCA,  EF 60-65%.   Chronic lower back pain    CKD (chronic kidney disease), stage III (HCC)    Complication of anesthesia    "Too much with shoulder surgery", pt. reports that he was told the at they "lost him, due to absorbing too much anesthesia".  shoulder surgery 1985   DDD (degenerative disc disease), lumbosacral    GERD (gastroesophageal reflux disease)    History of gastric ulcer 01/2017   History of kidney stones    History of malignant melanoma    right side of nose   History of non-ST elevation myocardial infarction (NSTEMI) 12/23/2017   s/p  staged cardiac cath,  s/p  PCI and DEStenting   Hyperlipidemia     Hypertension    IDA (iron deficiency anemia)    Iron deficiency anemia due to chronic blood loss 10/05/2021   Myocardial infarction (HCC)    x 2   Neuromuscular disorder (HCC)    neuropathy   Nocturia    RBBB    Renal calculus, right    S/P drug eluting coronary stent placement 12-25-2017,  12-26-2017   PTCA and DES x3 to prox. and mid LAD;  PCI to PLA and DES x1 to midRCA   Type 2 diabetes mellitus treated with insulin ([place])    followed by pcp   Patient Active Problem List   Diagnosis Date Noted   Acute CVA (cerebrovascular accident) ([place]) 03/20/2022   Normocytic anemia 10/05/2021   Iron deficiency anemia due to chronic blood loss 10/05/2021   Pain due to unicompartmental arthroplasty of knee ([place]) 08/25/2020   Peripheral arterial disease ([place]) 11/19/2019   RBBB 01/16/2017   PUD (peptic ulcer disease) 01/16/2017   Melena 01/12/2017   Unstable angina ([place]) 01/11/2017   CAD S/P percutaneous coronary angioplasty 01/09/2017   Mixed hyperlipidemia 01/09/2017   DM type 2 causing vascular disease ([place]) 12/24/2016   Essential hypertension, benign 12/24/2016   Chest pain, rule out acute myocardial infarction 12/24/2016   ACS (acute coronary syndrome) ([place])    History of NSTEMI    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 Authorizing Provider  acetaminophen (TYLENOL) 500 MG tablet Take 1,000 mg by mouth every 6 (six) hours as needed for mild pain.   Yes [provider]  amoxicillin (AMOXIL) 500 MG capsule Take 2,000 mg by mouth See admin instructions. Take 2000 mg by mouth 1 hour prior to dental work 11/19/19  Yes [provider]  aspirin EC 81 MG tablet Take 1 tablet (81 mg total) by mouth daily. Swallow whole. 09/12/21  Yes Burnell Blanks, MD  cyclobenzaprine (FLEXERIL) 10 MG tablet Take 1 tablet (10 mg total) by mouth 3 (three) times daily as needed for muscle spasms. 03/31/21   Yes Shuford, Olivia Mackie, PA-C  empagliflozin (JARDIANCE) 25 MG TABS tablet Take 12.5 mg by mouth daily.   Yes [provider]  ezetimibe (ZETIA) 10 MG tablet Take 1 tablet (10 mg total) by mouth daily. 09/08/20  Yes Burnell Blanks, MD  ferrous gluconate (FERGON) 324 MG tablet Take 324 mg by mouth 3 (three) times a week. Mon, Wed, friday   Yes [provider]  insulin aspart protamine- aspart (NOVOLOG MIX 70/30) (70-30) 100 UNIT/ML injection Inject 10 Units into the skin 2 (two) times daily with a meal.   Yes [provider]  isosorbide mononitrate (IMDUR) 60 MG 24 hr tablet Take 60 mg by mouth daily.   Yes [provider]  losartan (COZAAR) 50 MG tablet Take 1 tablet (50 mg total) by mouth daily. Patient taking differently: Take 50 mg by mouth at bedtime. 01/14/20  Yes Cassandria Anger, MD  metFORMIN (GLUCOPHAGE XR) 500 MG 24 hr tablet Take 1 tablet (500 mg total) by mouth daily with breakfast. 02/10/20  Yes Nida, Marella Chimes, MD  nitroGLYCERIN (NITROSTAT) 0.4 MG SL tablet Place 1 tablet (0.4 mg total) under the tongue every 5 (five) minutes as needed for chest pain. 12/27/16  Yes Rama, Venetia Maxon, MD  Omega-3 Fatty Acids (FISH OIL) 1000 MG CAPS Take 1,000 mg by mouth daily.   Yes [provider]  ondansetron (ZOFRAN) 4 MG tablet Take 1 tablet (4 mg total) by mouth every 8 (eight) hours as needed for nausea or vomiting. 03/31/21  Yes Shuford, Olivia Mackie, PA-C  pantoprazole (PROTONIX) 40 MG tablet Take 40 mg by mouth daily.   Yes [provider]  rosuvastatin (CRESTOR) 10 MG tablet Take 5 mg by mouth at bedtime.   Yes [provider]  tamsulosin (FLOMAX) 0.4 MG CAPS capsule Take 0.4 mg by mouth daily after supper.   Yes [provider]  Vitamin D, Cholecalciferol, 25 MCG (1000 UT) CAPS Take 1,000 Units by mouth daily.    Yes [provider]  Zinc 50 MG CAPS Take 50 mg by mouth daily.   Yes [provider]   Continuous Blood Gluc Receiver (FREESTYLE LIBRE 2 READER) DEVI As directed 03/31/20   Cassandria Anger, MD  Continuous Blood Gluc Sensor (FREESTYLE LIBRE 2 SENSOR) MISC 1 Piece by Does not apply route every 14 (fourteen) days. 11/30/20   Cassandria Anger, MD  metoprolol tartrate (LOPRESSOR) 25 MG tablet TAKE 1 TABLET BY MOUTH TWICE A DAY Patient not taking: Reported on 03/20/2022 01/18/18   Consuelo Pandy, PA-C  Past Surgical History Past Surgical History:  Procedure Laterality Date   ANAL FISSURE REPAIR  X 2   BACK SURGERY     CATARACT EXTRACTION W/ INTRAOCULAR LENS  IMPLANT, BILATERAL Bilateral 2017;  2015   COLONOSCOPY WITH PROPOFOL N/A 06/05/2016   Procedure: COLONOSCOPY WITH PROPOFOL;  Surgeon: Garlan Fair, MD;  Location: [facility];  Service: Endoscopy;  Laterality: N/A;   CONVERSION TO TOTAL KNEE Right 08/25/2020   Procedure: Revision right knee unicompartmental arthroplasty to total knee arthroplasty;  Surgeon: Gaynelle Arabian, MD;  Location: [facility];  Service: Orthopedics;  Laterality: Right;   CORONARY ANGIOGRAPHY N/A 12/26/2016   Procedure: CORONARY ANGIOGRAPHY;  Surgeon: Troy Sine, MD;  Location: [facility];  Service: Cardiovascular;  Laterality: N/A;   CORONARY STENT INTERVENTION N/A 12/25/2016   Procedure: CORONARY STENT INTERVENTION;  Surgeon: Burnell Blanks, MD;  Location: [facility];  Service: Cardiovascular;  Laterality: N/A;   CORONARY STENT INTERVENTION N/A 12/26/2016   Procedure: CORONARY STENT INTERVENTION;  Surgeon: Troy Sine, MD;  Location: [facility];  Service: Cardiovascular;  Laterality: N/A;   CYSTOSCOPY/URETEROSCOPY/HOLMIUM LASER/STENT PLACEMENT Right 01/07/2018   Procedure: RIGHT URETEROSCOPY/HOLMIUM LASER/STENT PLACEMENT;  Surgeon: Lucas Mallow, MD;  Location:  [facility];  Service: Urology;  Laterality: Right;   ESOPHAGOGASTRODUODENOSCOPY (EGD) WITH PROPOFOL N/A 01/12/2017   Procedure: ESOPHAGOGASTRODUODENOSCOPY (EGD) WITH PROPOFOL;  Surgeon: Wilford Corner, MD;  Location: [facility];  Service: Endoscopy;  Laterality: N/A;   HAND TENDON SURGERY Right 10-29-2002   dr Burney Gauze @[facility]$    right index and thumb   KNEE ARTHROSCOPY Bilateral 2009-;2010   @[facility]$    LEFT HEART CATH AND CORONARY ANGIOGRAPHY N/A 12/25/2016   Procedure: LEFT HEART CATH AND CORONARY ANGIOGRAPHY;  Surgeon: Burnell Blanks, MD;  Location: [place] [facility];  Service: Cardiovascular;  Laterality: N/A;   LEFT HEART CATH AND CORONARY ANGIOGRAPHY N/A 10/10/2019   Procedure: LEFT HEART CATH AND CORONARY ANGIOGRAPHY;  Surgeon: Troy Sine, MD;  Location: [place] [facility];  Service: Cardiovascular;  Laterality: N/A;   LUMBAR LAMINECTOMY/DECOMPRESSION MICRODISCECTOMY N/A 06/09/2015   Procedure: Laminectomy - T12-L1;  Surgeon: Eustace Moore, MD;  Location: [facility];  Service: Neurosurgery;  Laterality: N/A;  Laminectomy - T12-L1   MEDIAL PARTIAL KNEE REPLACEMENT Bilateral 2009-2010    [facility]    MELANOMA EXCISION Right    "side of my nose"   REVERSE SHOULDER ARTHROPLASTY Right 03/31/2021   Procedure: REVERSE SHOULDER ARTHROPLASTY;  Surgeon: Justice Britain, MD;  Location: [facility];  Service: Orthopedics;  Laterality: Right;  131mn   SHOULDER SURGERY Left 1985   TOTAL SHOULDER ARTHROPLASTY Left 12/06/2012   Procedure: LEFT TOTAL SHOULDER ARTHROPLASTY VERSES A REVERSE TOTAL SHOULDER ARTHROPLASTY;  Surgeon: SAugustin Schooling MD;  [place] [facility]  Service: Orthopedics;  Laterality: Left;   Family History Family History  Problem Relation Age of Onset   CAD Father    Heart failure Father    CAD Sister    Stroke Mother    Heart failure Mother     Social History Social History   Tobacco Use   Smoking status: Never   Smokeless tobacco: Never  Vaping  Use   Vaping Use: Never used  Substance Use Topics   Alcohol use: No   Drug use: No   Allergies Statins, Oxycodone, Hydrocodone, Codeine, and Morphine and related  Review of Systems Review of Systems  Unable to perform ROS: Mental status change  Physical Exam Vital Signs  I have reviewed the triage vital signs BP (!) 182/92   Pulse 70   Temp 98.6 F (37 C) (Oral)   Resp 18   SpO2 96%  Physical Exam Vitals and nursing note reviewed. Exam conducted with a chaperone present.  Constitutional:      General: He is not in acute distress.    Appearance: Normal appearance. He is not ill-appearing.  HENT:     Head: Normocephalic and atraumatic.     Right Ear: External ear normal.     Left Ear: External ear normal.     Nose: Nose normal.     Mouth/Throat:     Mouth: Mucous membranes are moist.  Eyes:     General: No scleral icterus.       Right eye: No discharge.        Left eye: No discharge.     Pupils: Pupils are equal, round, and reactive to light.  Cardiovascular:     Rate and Rhythm: Normal rate and regular rhythm.     Pulses: Normal pulses.     Heart sounds: Normal heart sounds. No murmur heard. Pulmonary:     Effort: Pulmonary effort is normal. No respiratory distress.     Breath sounds: Normal breath sounds. No stridor.  Abdominal:     General: Abdomen is flat.     Palpations: Abdomen is soft.     Tenderness: There is no abdominal tenderness.  Musculoskeletal:     Cervical back: No rigidity.     Right lower leg: No edema.     Left lower leg: No edema.  Skin:    General: Skin is warm and dry.     Capillary Refill: Capillary refill takes less than 2 seconds.     Coloration: Skin is not jaundiced.  Neurological:     Mental Status: He is alert.     Comments: Patient will withdraw from noxious stimulus in all 4 extremities He is moving all 4 extremities but appears to be favoring his left side. Very poor compliance with neurologic testing     ED Results  and Treatments Labs (all labs ordered are listed, but only abnormal results are displayed) Labs Reviewed  CBC - Abnormal; Notable for the following components:      Result Value   Platelets 139 (*)    All other components within normal limits  COMPREHENSIVE METABOLIC PANEL - Abnormal; Notable for the following components:   Sodium 132 (*)    Chloride 96 (*)    Glucose, Bld 180 (*)    Creatinine, Ser 2.01 (*)    Total Bilirubin 1.4 (*)    GFR, Estimated 33 (*)    All other components within normal limits  CBG MONITORING, ED - Abnormal; Notable for the following components:   Glucose-Capillary 166 (*)    All other components within normal limits  I-STAT CHEM 8, ED - Abnormal; Notable for the following components:   Potassium 3.4 (*)    Creatinine, Ser 1.60 (*)    Glucose, Bld 137 (*)    Calcium, Ion 0.98 (*)    Hemoglobin 11.9 (*)    HCT 35.0 (*)    All other components within normal limits  ETHANOL  PROTIME-INR  APTT  DIFFERENTIAL  RAPID URINE DRUG SCREEN, HOSP PERFORMED  URINALYSIS, ROUTINE W REFLEX MICROSCOPIC  HEMOGLOBIN A1C  Radiology CT ANGIO HEAD NECK W WO CM W PERF (CODE STROKE)  Result Date: 03/20/2022 CLINICAL DATA:  Neuro deficit, acute, stroke suspected. Aphasia. Unable to straighten right arm. EXAM: CT ANGIOGRAPHY HEAD AND NECK CT PERFUSION BRAIN TECHNIQUE: Multidetector CT imaging of the head and neck was performed using the standard protocol during bolus administration of intravenous contrast. Multiplanar CT image reconstructions and MIPs were obtained to evaluate the vascular anatomy. Carotid stenosis measurements (when applicable) are obtained utilizing NASCET criteria, using the distal internal carotid diameter as the denominator. Multiphase CT imaging of the brain was performed following IV bolus contrast injection. Subsequent parametric  perfusion maps were calculated using RAPID software. RADIATION DOSE REDUCTION: This exam was performed according to the departmental dose-optimization program which includes automated exposure control, adjustment of the mA and/or kV according to patient size and/or use of iterative reconstruction technique. CONTRAST:  119m OMNIPAQUE IOHEXOL 350 MG/ML SOLN COMPARISON:  Same-day CT head. FINDINGS: CTA NECK FINDINGS Aortic arch: Three-vessel arch configuration. Atherosclerotic calcifications of the aortic arch and arch vessel origins. Mild stenosis of the left subclavian artery origin. Right carotid system: Mild calcified plaque along the right common carotid. Predominantly calcified plaque at the carotid bulb and proximal right ICA without hemodynamically significant stenosis. Left carotid system: Unremarkable common carotid. Densely calcified plaque at the left carotid bulb and proximal left ICA without hemodynamically significant stenosis. Vertebral arteries: Calcified plaque results in mild stenosis of the right vertebral artery origin. Scattered calcified plaque along the V2 segments bilaterally. Densely calcified, circumferential plaque of the right V4 segment results in at least moderate stenosis. Mixed plaque along the left V4 segment results in mild stenosis. Skeleton: Multilevel cervical spondylosis, worst at C5-6, where there is at least moderate spinal canal stenosis. No suspicious bone lesions. Other neck: Streak artifact from bilateral reverse shoulder arthroplasties. Otherwise unremarkable. Upper chest: Unremarkable. Review of the MIP images confirms the above findings CTA HEAD FINDINGS Anterior circulation: Calcified plaque along the carotid siphons without hemodynamically significant stenosis. Mild stenosis of the left MCA origin. Vessel is otherwise patent proximally. The right MCA and bilateral ACAs are patent proximally. Distal branches are symmetric. Posterior circulation: Normal basilar artery.  The SCAs, AICAs and PICAs are patent proximally. The PCAs are patent proximally without stenosis or aneurysm. Distal branches are symmetric. Venous sinuses: As permitted by contrast timing, patent. Anatomic variants: Hypoplastic left A1 segment. Hypoplastic left P1 segment with persistent fetal origin of the left PCA. Review of the MIP images confirms the above findings CT Brain Perfusion Findings: ASPECTS: 10 CBF (<30%) Volume: 024mPerfusion (Tmax>6.0s) volume: 5326mThis is favored to be artifactual due to patient motion artifact in the 20-40 second window. Mismatch Volume: 60m55mgain favored to represent artifact. Infarction Location: Not applicable. IMPRESSION: 1. No large vessel occlusion or hemodynamically significant stenosis in the neck. 2. Perfusion imaging results are favored to be artifactual due to patient motion. 3. Moderate stenosis of the right vertebral artery V4 segment. Mild stenosis of the left MCA M1 segment. 4. Aortic Atherosclerosis (ICD10-I70.0). Code stroke imaging results were communicated on 03/20/2022 at 11:12 am to provider Dr. StacQuinn Axe telephone, who verbally acknowledged these results. Electronically Signed   By: WaltEmmit Alexanders.   On: 03/20/2022 11:26   CT HEAD CODE STROKE WO CONTRAST  Result Date: 03/20/2022 CLINICAL DATA:  Code stroke. Neuro deficit, acute, stroke suspected. Aphasia. Last seen normal last night. Unable to straighten right arm. EXAM: CT HEAD WITHOUT CONTRAST TECHNIQUE: Contiguous axial images were obtained  from the base of the skull through the vertex without intravenous contrast. RADIATION DOSE REDUCTION: This exam was performed according to the departmental dose-optimization program which includes automated exposure control, adjustment of the mA and/or kV according to patient size and/or use of iterative reconstruction technique. COMPARISON:  Head CT 05/13/2019. FINDINGS: Brain: Age-indeterminate lacunar infarct in the left thalamus. No acute intracranial  hemorrhage. Tykwon Fera-white differentiation is otherwise preserved. No hydrocephalus or extra-axial collection. Vascular: No hyperdense vessel. Dense atherosclerotic calcification of the left carotid terminus with extension into the proximal left MCA M1 segment. Skull: No calvarial fracture or suspicious bone lesion. Skull base is unremarkable. Sinuses/Orbits: Unremarkable. Other: None. ASPECTS [facility] Stroke Program Early CT Score) - Ganglionic level infarction (caudate, lentiform nuclei, internal capsule, insula, M1-M3 cortex): 7 - Supraganglionic infarction (M4-M6 cortex): 3 Total score (0-10 with 10 being normal): 10 IMPRESSION: 1. No acute intracranial hemorrhage or evidence of large vessel territory infarction. ASPECT score is 10. 2. Age-indeterminate lacunar infarct in the left thalamus. 3. Dense atherosclerotic calcification of the left carotid terminus with extension into the proximal left MCA M1 segment. No hyperdense vessel. Code stroke imaging results were communicated on 03/20/2022 at 10:53 am to provider Dr. Pearline Cables via telephone, who verbally acknowledged these results. Electronically Signed   By: Emmit Alexanders M.D.   On: 03/20/2022 10:54    Pertinent labs & imaging results that were available during my care of the patient were reviewed by me and considered in my medical decision making (see MDM for details).  Medications Ordered in ED Medications  aspirin tablet 325 mg (325 mg Oral Given 03/20/22 1455)    Followed by  aspirin EC tablet 81 mg (has no administration in time range)  clopidogrel (PLAVIX) tablet 75 mg (75 mg Oral Given 03/20/22 1455)   stroke: early stages of recovery book (has no administration in time range)  acetaminophen (TYLENOL) tablet 650 mg (has no administration in time range)    Or  acetaminophen (TYLENOL) suppository 650 mg (has no administration in time range)  ondansetron (ZOFRAN) tablet 4 mg (has no administration in time range)    Or  ondansetron (ZOFRAN) injection  4 mg (has no administration in time range)  sodium chloride (PF) 0.9 % injection (  Given by Other 03/20/22 1154)  iohexol (OMNIPAQUE) 350 MG/ML injection 100 mL (125 mLs Intravenous Contrast Given 03/20/22 1058)  sodium chloride 0.9 % bolus 1,000 mL (0 mLs Intravenous Stopped 03/20/22 1457)                                                                                                                                     Procedures .Critical Care  Performed by: Jeanell Sparrow, DO Authorized by: Jeanell Sparrow, DO   Critical care provider statement:    Critical care time (minutes):  75   Critical care time was exclusive of:  Separately billable procedures and treating other patients  Critical care was necessary to treat or prevent imminent or life-threatening deterioration of the following conditions:  CNS failure or compromise and dehydration   Critical care was time spent personally by me on the following activities:  Development of treatment plan with patient or surrogate, discussions with consultants, evaluation of patient's response to treatment, examination of patient, ordering and review of laboratory studies, ordering and review of radiographic studies, ordering and performing treatments and interventions, pulse oximetry, re-evaluation of patient's condition, review of old charts and obtaining history from patient or surrogate   Care discussed with: admitting provider     (including critical care time)  Medical Decision Making / ED Course    Medical Decision Making:    SEIBERT BJELLAND is a 82 y.o. male with past medical history as below, significant for CKD, HTN, HLD, IDA, CAD who presents to the ED with complaint of AMS.  Patient arrives with spouse, reports that he went to bed around 6 PM or so yesterday evening was in his normal state of health that time. . The complaint involves an extensive differential diagnosis and also carries with it a high risk of complications and  morbidity.  Serious etiology was considered. Ddx includes but is not limited to: Differential diagnoses for altered mental status includes but is not exclusive to alcohol, illicit or prescription medications, intracranial pathology such as stroke, intracerebral hemorrhage, fever or infectious causes including sepsis, hypoxemia, uremia, trauma, endocrine related disorders such as diabetes, hypoglycemia, thyroid-related diseases, etc.   Complete initial physical exam performed, notably the patient  was aphasic.   POC glucose completed. Stroke alert activated; sent to CT with stroke - bot Reviewed and confirmed nursing documentation for past medical history, family history, social history.  Vital signs reviewed.    Clinical Course as of 03/20/22 1636  Mon Mar 20, 2022  1203 Creatinine(!): 2.01 Cr worsened from prior, start IVF, UA pending [SG]  1204 D/w Dr Quinn Axe, recommends medical admission for further stroke w/u. [facility] to [facility] [SG]    Clinical Course User Index [SG] Wynona Dove A, DO   Pt with aphasia, CTH and CTA were negative, not TNK candidate given duration of symptoms. 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 UA pending; pt with recent kidney stone dx, on flomax for this; no sig flank or abdomen discomfort on exam Recommend admission, d/w [facility] Dr Olevia Bowens who accept pt for admit Pt HDS at time of admission. MRI pending at time of admission; neuro recommends [facility] to [facility], relayed to Dr Olevia Bowens   Additional history obtained: -Additional history obtained from spouse -External records from outside source obtained and reviewed including: Chart review including previous notes, labs, imaging, consultation notes including primary care documentation, prior ed visit, home meds    Lab Tests: -I ordered, reviewed, and interpreted labs.   The pertinent results include:   Labs Reviewed  CBC - Abnormal; Notable for the following components:       Result Value   Platelets 139 (*)    All other components within normal limits  COMPREHENSIVE METABOLIC PANEL - Abnormal; Notable for the following components:   Sodium 132 (*)    Chloride 96 (*)    Glucose, Bld 180 (*)    Creatinine, Ser 2.01 (*)    Total Bilirubin 1.4 (*)    GFR, Estimated 33 (*)    All other components within normal limits  CBG MONITORING, ED - Abnormal; Notable for the  following components:   Glucose-Capillary 166 (*)    All other components within normal limits  I-STAT CHEM 8, ED - Abnormal; Notable for the following components:   Potassium 3.4 (*)    Creatinine, Ser 1.60 (*)    Glucose, Bld 137 (*)    Calcium, Ion 0.98 (*)    Hemoglobin 11.9 (*)    HCT 35.0 (*)    All other components within normal limits  ETHANOL  PROTIME-INR  APTT  DIFFERENTIAL  RAPID URINE DRUG SCREEN, HOSP PERFORMED  URINALYSIS, ROUTINE W REFLEX MICROSCOPIC  HEMOGLOBIN A1C    Notable for aki  EKG   EKG Interpretation  Date/Time:  Monday March 20 2022 10:08:11 EST Ventricular Rate:  79 PR Interval:  141 QRS Duration: 147 QT Interval:  414 QTC Calculation: 475 R Axis:   72 Text Interpretation: Sinus rhythm Right bundle branch block Confirmed by Wynona Dove ([id number]) on 03/20/2022 12:03:00 PM         Imaging Studies ordered: I ordered imaging studies including CTH CTA I independently visualized the following imaging with scope of interpretation limited to determining acute life threatening conditions related to emergency care: stable I independently visualized and interpreted imaging. I agree with the radiologist interpretation   Medicines ordered and prescription drug management: Meds ordered this encounter  Medications   sodium chloride (PF) 0.9 % injection    Antonieta Iba A: cabinet override   iohexol (OMNIPAQUE) 350 MG/ML injection 100 mL   sodium chloride 0.9 % bolus 1,000 mL   FOLLOWED BY Linked Order Group    aspirin tablet 325 mg    aspirin EC tablet 81 mg    clopidogrel (PLAVIX) tablet 75 mg    stroke: early stages of recovery book   OR Linked Order Group    acetaminophen (TYLENOL) tablet 650 mg    acetaminophen (TYLENOL) suppository 650 mg   OR Linked Order Group    ondansetron (ZOFRAN) tablet 4 mg    ondansetron (ZOFRAN) injection 4 mg    -I have reviewed the patients home medicines and have made adjustments as needed   Consultations Obtained: I requested consultation with the neuro,  and discussed lab and imaging findings as well as pertinent plan - they recommend: admit/mri/stroke w/u   Cardiac Monitoring: The patient was maintained on a cardiac monitor.  I personally viewed and interpreted the cardiac monitored which showed an underlying rhythm of: nsr  Social Determinants of Health:  Diagnosis or treatment significantly limited by social determinants of health: na   Reevaluation: After the interventions noted above, I reevaluated the patient and found that they have stayed the same  Co morbidities that complicate the patient evaluation  Past Medical History:  Diagnosis Date   Anticoagulated    plavix   Arthritis    Shoulder, knees, back    CAD in native artery cardiologist-  dr Angelena Form   a. CAD/NSTEMI ,  cardiac cath staged stenting-- 12-25-2016  PTCA and DES x3 to prox. and mid LAD;  12-26-2016  PCI to PLA and DES x1 to midRCA,  EF 60-65%.   Chronic lower back pain    CKD (chronic kidney disease), stage III (HCC)    Complication of anesthesia    "Too much with shoulder surgery", pt. reports that he was told the at they "lost him, due to absorbing too much anesthesia".  shoulder surgery 1985   DDD (degenerative disc disease), lumbosacral    GERD (gastroesophageal reflux disease)    History of gastric ulcer  01/2017   History of kidney stones    History of malignant melanoma    right side of nose   History of non-ST elevation myocardial infarction (NSTEMI) 12/23/2017   s/p  staged cardiac cath,  s/p  PCI and  DEStenting   Hyperlipidemia    Hypertension    IDA (iron deficiency anemia)    Iron deficiency anemia due to chronic blood loss 10/05/2021   Myocardial infarction (HCC)    x 2   Neuromuscular disorder (HCC)    neuropathy   Nocturia    RBBB    Renal calculus, right    S/P drug eluting coronary stent placement 12-25-2017,  12-26-2017   PTCA and DES x3 to prox. and mid LAD;  PCI to PLA and DES x1 to midRCA   Type 2 diabetes mellitus treated with insulin ([place])    followed by pcp      Dispostion: Disposition decision including need for hospitalization was considered, and patient admitted to the hospital.    Final Clinical Impression(s) / ED Diagnoses Final diagnoses:  Altered mental status, unspecified altered mental status type  Aphasia  AKI (acute kidney injury) ([place])     This chart was dictated using voice recognition software.  Despite best efforts to proofread,  errors can occur which can change the documentation meaning.    Jeanell Sparrow, DO 03/20/22 1636

## 2022-03-20 NOTE — Plan of Care (Addendum)
Admission complete with client. VSS. Wife is by the bedside. MD made aware of client and his wifes questions about CT and MRI results.   Problem: Education: Goal: Knowledge of disease or condition will improve Outcome: Progressing Goal: Knowledge of secondary prevention will improve (MUST DOCUMENT ALL) Outcome: Progressing Goal: Knowledge of patient specific risk factors will improve [place] N/A or DELETE if not current risk factor) Outcome: Progressing   Problem: Ischemic Stroke/TIA Tissue Perfusion: Goal: Complications of ischemic stroke/TIA will be minimized Outcome: Progressing   Problem: Coping: Goal: Will verbalize positive feelings about self Outcome: Progressing Goal: Will identify appropriate support needs Outcome: Progressing   Problem: Health Behavior/Discharge Planning: Goal: Ability to manage health-related needs will improve Outcome: Progressing Goal: Goals will be collaboratively established with patient/family Outcome: Progressing   Problem: Self-Care: Goal: Ability to participate in self-care as condition permits will improve Outcome: Progressing Goal: Verbalization of feelings and concerns over difficulty with self-care will improve Outcome: Progressing Goal: Ability to communicate needs accurately will improve Outcome: Progressing   Problem: Nutrition: Goal: Risk of aspiration will decrease Outcome: Progressing Goal: Dietary intake will improve Outcome: Progressing   Problem: Education: Goal: Knowledge of General Education information will improve Description: Including pain rating scale, medication(s)/side effects and non-pharmacologic comfort measures Outcome: Progressing   Problem: Health Behavior/Discharge Planning: Goal: Ability to manage health-related needs will improve Outcome: Progressing   Problem: Clinical Measurements: Goal: Ability to maintain clinical measurements within normal limits will improve Outcome: Progressing Goal: Will  remain free from infection Outcome: Progressing Goal: Diagnostic test results will improve Outcome: Progressing Goal: Respiratory complications will improve Outcome: Progressing Goal: Cardiovascular complication will be avoided Outcome: Progressing   Problem: Activity: Goal: Risk for activity intolerance will decrease Outcome: Progressing   Problem: Nutrition: Goal: Adequate nutrition will be maintained Outcome: Progressing   Problem: Coping: Goal: Level of anxiety will decrease Outcome: Progressing   Problem: Elimination: Goal: Will not experience complications related to bowel motility Outcome: Progressing Goal: Will not experience complications related to urinary retention Outcome: Progressing   Problem: Pain Managment: Goal: General experience of comfort will improve Outcome: Progressing   Problem: Safety: Goal: Ability to remain free from injury will improve Outcome: Progressing   Problem: Skin Integrity: Goal: Risk for impaired skin integrity will decrease Outcome: Progressing

## 2022-03-20 NOTE — Progress Notes (Unsigned)
  Care Coordination  Outreach Note  03/20/2022 Name: Matthew [name] MRN: [id number] DOB: [birth year]   Care Coordination Outreach Attempts: A second unsuccessful outreach was attempted today to offer the patient with information about available care coordination services as a benefit of their health plan.     Follow Up Plan:  Additional outreach attempts will be made to offer the patient care coordination information and services.   Encounter Outcome:  No Answer  [place]  Direct Dial: [phone]

## 2022-03-20 NOTE — ED Notes (Signed)
Pt passed swallow screen

## 2022-03-20 NOTE — ED Notes (Signed)
Contacted [facility] and gave report.

## 2022-03-20 NOTE — Consult Note (Signed)
NEUROLOGY TELECONSULTATION NOTE   Date of service: March 20, 2022 Patient Name: Matthew [name] MRN:  [id number] DOB:  1940-03-30 Reason for consult: telestroke  Requesting Provider: Dr. Wynona Dove Consult Participants: myself, patient, bedside RN, telestroke RN Location of the provider: [facility] Location of the patient: [facility]  This consult was provided via telemedicine with 2-way video and audio communication. The patient/family was informed that care would be provided in this way and agreed to receive care in this manner.   _ _ _   _ __   _ __ _ _  __ __   _ __   __ _  History of Present Illness   This is a 82 yo man with hx CAD s/p NSTEMI, PCI and DES 2019 on plavix, CKD stage III, remote hx malignant melanoma, HL, HTN, DM2 BIB EMS for global aphasia. He is not able to get his words out or follow commands. He was initially reported to be unable to look to the right but on my examination EOM were intact. At baseline highly functional per wife with no deficits.  LKW 1800 yesterday when he went to bed. Wife heard him get out of bed and move around at 0500 today but she did not speak to him at that time. He woke up at 0830 and wife noted him unable to speak at that time. He is not on anticoagulation. NIHSS = 16. Head CT personal review showed NAICP with ASPECTS 10. TNK not administered 2/2 presentation outside of the window. CT perfusion was ordered per standard of care given concern for large vessel occlusion with last known well >6 hrs ago. CTA showed no LVO, small amount of perfusion abnormality on CTP favored to be artifactual 2/2 movement.  All CNS imaging personally reviewed.   The following delays occurred during the stroke code - ED CT scanner not functioning therefore patient had to be transported to inpatient scanner - Inpatient scanner lost power when we arrived and had to be rebooted - IV access - Additional contrast had to be loaded between CTA and CTP due to machine  error   ROS   UTA 2/2 aphasia  Past History   The following was personally reviewed:  Past Medical History:  Diagnosis Date   Anticoagulated    plavix   Arthritis    Shoulder, knees, back    CAD in native artery cardiologist-  dr Angelena Form   a. CAD/NSTEMI ,  cardiac cath staged stenting-- 12-25-2016  PTCA and DES x3 to prox. and mid LAD;  12-26-2016  PCI to PLA and DES x1 to midRCA,  EF 60-65%.   Chronic lower back pain    CKD (chronic kidney disease), stage III (HCC)    Complication of anesthesia    "Too much with shoulder surgery", pt. reports that he was told the at they "lost him, due to absorbing too much anesthesia".  shoulder surgery 1985   DDD (degenerative disc disease), lumbosacral    GERD (gastroesophageal reflux disease)    History of gastric ulcer 01/2017   History of kidney stones    History of malignant melanoma    right side of nose   History of non-ST elevation myocardial infarction (NSTEMI) 12/23/2017   s/p  staged cardiac cath,  s/p  PCI and DEStenting   Hyperlipidemia    Hypertension    IDA (iron deficiency anemia)    Iron deficiency anemia due to chronic blood loss 10/05/2021   Myocardial infarction ([place])  x 2   Neuromuscular disorder (HCC)    neuropathy   Nocturia    RBBB    Renal calculus, right    S/P drug eluting coronary stent placement 12-25-2017,  12-26-2017   PTCA and DES x3 to prox. and mid LAD;  PCI to PLA and DES x1 to midRCA   Type 2 diabetes mellitus treated with insulin ([place])    followed by pcp   Past Surgical History:  Procedure Laterality Date   ANAL FISSURE REPAIR  X 2   BACK SURGERY     CATARACT EXTRACTION W/ INTRAOCULAR LENS  IMPLANT, BILATERAL Bilateral 2017;  2015   COLONOSCOPY WITH PROPOFOL N/A 06/05/2016   Procedure: COLONOSCOPY WITH PROPOFOL;  Surgeon: Garlan Fair, MD;  Location: [facility];  Service: Endoscopy;  Laterality: N/A;   CONVERSION TO TOTAL KNEE Right 08/25/2020   Procedure: Revision right knee  unicompartmental arthroplasty to total knee arthroplasty;  Surgeon: Gaynelle Arabian, MD;  Location: [facility];  Service: Orthopedics;  Laterality: Right;   CORONARY ANGIOGRAPHY N/A 12/26/2016   Procedure: CORONARY ANGIOGRAPHY;  Surgeon: Troy Sine, MD;  Location: [place] [facility];  Service: Cardiovascular;  Laterality: N/A;   CORONARY STENT INTERVENTION N/A 12/25/2016   Procedure: CORONARY STENT INTERVENTION;  Surgeon: Burnell Blanks, MD;  Location: [place] [facility];  Service: Cardiovascular;  Laterality: N/A;   CORONARY STENT INTERVENTION N/A 12/26/2016   Procedure: CORONARY STENT INTERVENTION;  Surgeon: Troy Sine, MD;  Location: [place] [facility];  Service: Cardiovascular;  Laterality: N/A;   CYSTOSCOPY/URETEROSCOPY/HOLMIUM LASER/STENT PLACEMENT Right 01/07/2018   Procedure: RIGHT URETEROSCOPY/HOLMIUM LASER/STENT PLACEMENT;  Surgeon: Lucas Mallow, MD;  Location: [facility];  Service: Urology;  Laterality: Right;   ESOPHAGOGASTRODUODENOSCOPY (EGD) WITH PROPOFOL N/A 01/12/2017   Procedure: ESOPHAGOGASTRODUODENOSCOPY (EGD) WITH PROPOFOL;  Surgeon: Wilford Corner, MD;  Location: [facility];  Service: Endoscopy;  Laterality: N/A;   HAND TENDON SURGERY Right 10-29-2002   dr Burney Gauze @[facility]$    right index and thumb   KNEE ARTHROSCOPY Bilateral 2009-;2010   @[facility]$    LEFT HEART CATH AND CORONARY ANGIOGRAPHY N/A 12/25/2016   Procedure: LEFT HEART CATH AND CORONARY ANGIOGRAPHY;  Surgeon: Burnell Blanks, MD;  Location: [place] [facility];  Service: Cardiovascular;  Laterality: N/A;   LEFT HEART CATH AND CORONARY ANGIOGRAPHY N/A 10/10/2019   Procedure: LEFT HEART CATH AND CORONARY ANGIOGRAPHY;  Surgeon: Troy Sine, MD;  Location: [place] [facility];  Service: Cardiovascular;  Laterality: N/A;   LUMBAR LAMINECTOMY/DECOMPRESSION MICRODISCECTOMY N/A 06/09/2015   Procedure: Laminectomy - T12-L1;  Surgeon: Eustace Moore, MD;  Location: [place] [facility];   Service: Neurosurgery;  Laterality: N/A;  Laminectomy - T12-L1   MEDIAL PARTIAL KNEE REPLACEMENT Bilateral 2009-2010    [facility]    MELANOMA EXCISION Right    "side of my nose"   REVERSE SHOULDER ARTHROPLASTY Right 03/31/2021   Procedure: REVERSE SHOULDER ARTHROPLASTY;  Surgeon: Justice Britain, MD;  [facility]: [facility];  Service: Orthopedics;  Laterality: Right;  134mn   SHOULDER SURGERY Left 1985   TOTAL SHOULDER ARTHROPLASTY Left 12/06/2012   Procedure: LEFT TOTAL SHOULDER ARTHROPLASTY VERSES A REVERSE TOTAL SHOULDER ARTHROPLASTY;  Surgeon: SAugustin Schooling MD;  Location: [facility]  Service: Orthopedics;  Laterality: Left;   Family History  Problem Relation Age of Onset   CAD Father    Heart failure Father    CAD Sister    Stroke Mother    Heart failure Mother  Social History   Socioeconomic History   Marital status: Married    Spouse name: Not on file   Number of children: Not on file   Years of education: Not on file   Highest education level: Not on file  Occupational History   Not on file  Tobacco Use   Smoking status: Never   Smokeless tobacco: Never  Vaping Use   Vaping Use: Never used  Substance and Sexual Activity   Alcohol use: No   Drug use: No   Sexual activity: Not Currently  Other Topics Concern   Not on file  Social History Narrative   Not on file   Social Determinants of Health   Financial Resource Strain: Not on file  Food Insecurity: Not on file  Transportation Needs: Not on file  Physical Activity: Not on file  Stress: Not on file  Social Connections: Not on file   Allergies  Allergen Reactions   Statins Other (See Comments)    Severe stomach pain.   Oxycodone Itching   Hydrocodone Other (See Comments)    "messes with my hearing"   Codeine Itching   Morphine And Related Itching    Medications   (Not in a hospital admission)     Current Facility-Administered Medications:    sodium chloride (PF) 0.9 % injection, , , ,   Current  Outpatient Medications:    acetaminophen (TYLENOL) 325 MG tablet, Take 2 tablets (650 mg total) by mouth every 6 (six) hours as needed for mild pain or headache., Disp: , Rfl:    Alogliptin Benzoate 12.5 MG TABS, Take 12.5 mg by mouth daily., Disp: 90 tablet, Rfl: 1   amoxicillin (AMOXIL) 500 MG capsule, Take 2,000 mg by mouth See admin instructions. Take 2000 mg by mouth 1 hour prior to dental work, Disp: , Rfl:    aspirin EC 81 MG tablet, Take 1 tablet (81 mg total) by mouth daily. Swallow whole., Disp: 90 tablet, Rfl: 3   Continuous Blood Gluc Receiver (FREESTYLE LIBRE 2 READER) DEVI, As directed, Disp: 1 each, Rfl: 0   Continuous Blood Gluc Sensor (FREESTYLE LIBRE 2 SENSOR) MISC, 1 Piece by Does not apply route every 14 (fourteen) days., Disp: 2 each, Rfl: 3   cyclobenzaprine (FLEXERIL) 10 MG tablet, Take 1 tablet (10 mg total) by mouth 3 (three) times daily as needed for muscle spasms., Disp: 30 tablet, Rfl: 1   empagliflozin (JARDIANCE) 25 MG TABS tablet, Take 12.5 mg by mouth daily., Disp: , Rfl:    ezetimibe (ZETIA) 10 MG tablet, Take 1 tablet (10 mg total) by mouth daily., Disp: 90 tablet, Rfl: 3   HYDROmorphone (DILAUDID) 2 MG tablet, Take 1 tablet (2 mg total) by mouth every 4 (four) hours as needed., Disp: 20 tablet, Rfl: 0   insulin aspart protamine- aspart (NOVOLOG MIX 70/30) (70-30) 100 UNIT/ML injection, Inject 7 Units into the skin 2 (two) times daily with a meal., Disp: , Rfl:    iron polysaccharides (NIFEREX) 150 MG capsule, Take 150 mg by mouth daily., Disp: , Rfl:    isosorbide mononitrate (IMDUR) 60 MG 24 hr tablet, Take 60 mg by mouth daily., Disp: , Rfl:    losartan (COZAAR) 50 MG tablet, Take 1 tablet (50 mg total) by mouth daily. (Patient taking differently: Take 50 mg by mouth at bedtime.), Disp: 90 tablet, Rfl: 1   metFORMIN (GLUCOPHAGE XR) 500 MG 24 hr tablet, Take 1 tablet (500 mg total) by mouth daily with breakfast., Disp: 90 tablet,  Rfl: 3   methocarbamol (ROBAXIN)  500 MG tablet, Take 1 tablet (500 mg total) by mouth every 6 (six) hours as needed for muscle spasms., Disp: 40 tablet, Rfl: 0   metoprolol tartrate (LOPRESSOR) 25 MG tablet, TAKE 1 TABLET BY MOUTH TWICE A DAY, Disp: 180 tablet, Rfl: 1   nitroGLYCERIN (NITROSTAT) 0.4 MG SL tablet, Place 1 tablet (0.4 mg total) under the tongue every 5 (five) minutes as needed for chest pain., Disp: 30 tablet, Rfl: 12   Omega-3 Fatty Acids (FISH OIL) 1000 MG CAPS, Take by mouth daily., Disp: , Rfl:    ondansetron (ZOFRAN) 4 MG tablet, Take 1 tablet (4 mg total) by mouth every 8 (eight) hours as needed for nausea or vomiting., Disp: 10 tablet, Rfl: 0   pantoprazole (PROTONIX) 40 MG tablet, Take 40 mg by mouth daily., Disp: , Rfl:    rosuvastatin (CRESTOR) 10 MG tablet, Take 5 mg by mouth at bedtime., Disp: , Rfl:    tamsulosin (FLOMAX) 0.4 MG CAPS capsule, Take 0.4 mg by mouth daily after supper., Disp: , Rfl:    Vitamin D, Cholecalciferol, 25 MCG (1000 UT) CAPS, Take 1,000 Units by mouth daily. , Disp: , Rfl:    Zinc 50 MG CAPS, Take 50 mg by mouth daily., Disp: , Rfl:   Vitals   There were no vitals filed for this visit.   There is no height or weight on file to calculate BMI.  Physical Exam   Exam performed over telemedicine with 2-way video and audio communication and with assistance of bedside RN  Physical Exam Gen: alert, not able to answer orientation questions or follow commands Resp: normal WOB CV: extremities appear well-perfused  Neuro: *MS: alert, not able to answer orientation questions or follow commands *Speech: moderate dysarthria, not able to name or repeat *CN: PERRL 44m, EOMI, blinks to threat bilat, sensation intact, smile symmetric, hearing intact to voice *Motor:   Normal bulk.  No tremor, rigidity or bradykinesia. Drift to bed in all extremities without focality *Sensory: SILT. Symmetric. No double-simultaneous extinction.  *Coordination:  UTA *Reflexes:  UTA 2/2  tele-exam *Gait: deferred  NIHSS  1a Level of Conscious.: 0 1b LOC Questions: 2 1c LOC Commands: 2 2 Best Gaze: 0 3 Visual: 0 4 Facial Palsy: 0 5a Motor Arm - left: 2 5b Motor Arm - Right: 2 6a Motor Leg - Left: 2 6b Motor Leg - Right: 2 7 Limb Ataxia: 0 8 Sensory: 0 9 Best Language: 2 10 Dysarthria: 2 11 Extinct. and Inatten.: 0  TOTAL: 16   Premorbid mRS = 1   Labs   CBC:  Recent Labs  Lab 03/20/22 1014  WBC 8.1  NEUTROABS 6.1  HGB 15.3  HCT 45.2  MCV 89.0  PLT 139*    Basic Metabolic Panel:  Lab Results  Component Value Date   NA 141 10/11/2021   K 4.1 10/11/2021   CO2 27 10/11/2021   GLUCOSE 213 (H) 10/11/2021   BUN 15 10/11/2021   CREATININE 1.25 (H) 10/11/2021   CALCIUM 8.8 (L) 10/11/2021   GFRNONAA 59 (L) 10/11/2021   GFRAA 69 10/08/2019   Lipid Panel:  Lab Results  Component Value Date   LDLCALC 117 (H) 11/11/2020   HgbA1c:  Lab Results  Component Value Date   HGBA1C 7.0 07/20/2021   Urine Drug Screen: No results found for: "LABOPIA", "COCAINSCRNUR", "LABBENZ", "AMPHETMU", "THCU", "LABBARB"  Alcohol Level     Component Value Date/Time   ETH <10 03/20/2022  79     Impression   This is a 82 yo man with hx CAD s/p NSTEMI, PCI and DES 2019 on plavix, CKD stage III, hx malignant melanoma, HL, HTN, DM2 BIB EMS for global aphasia. NIHSS = 16. TNK not administered 2/2 presentation outside the window. CT head showed no acute process and CTA showed no LVO. Recommend admission to [facility] for stroke workup.    Recommendations   - Admit to [facility] for stroke workup - Permissive HTN x48 hrs from sx onset or until stroke ruled out by MRI goal BP <220/110. PRN labetalol or hydralazine if BP above these parameters. Avoid oral antihypertensives. - MRI brain wo contrast - TTE  - Check A1c and LDL + add statin per guidelines - ASA 82m daily + plavix 771mdaily x21 days f/b plavix 7539maily monotherapy after that - q4 hr neuro checks -  STAT head CT for any change in neuro exam - Tele - PT/OT/SLP - Stroke education - Amb referral to neurology upon discharge   [facility]  Addendum 1450: MRI brain showed no e/o acute ischemic stroke. Recommend workup above for possible TIA as well as EEG and metabolic/infectious workup per admitting team.  ______________________________________________________________________   Thank you for the opportunity to take part in the care of this patient. If you have any further questions, please contact the neurology consultation attending.  Signed,  ColSu MonksD [facility] [phone] 7pm- 7am, please page neurology on call as listed in [facility]**Any copied and pasted documentation in this note was written by me in another application not billed for and pasted by me into this document.

## 2022-03-20 NOTE — H&P (Signed)
History and Physical    Patient: Matthew [name] [id number] DOB: 06-22-1940 DOA: 03/20/2022 DOS: the patient was seen and examined on 03/20/2022 PCP: Wenda Low, MD  Patient coming from: Home  Chief Complaint:  Chief Complaint  Patient presents with   Altered Mental Status   HPI: Matthew [name] is a 82 y.o. male with medical history significant of osteoarthritis, CAD, NSTEMI x 2, PTCA and DES x 3, chronic lower back pain, stage III CKD, lumbosacral DDD, GERD, PUD, nephrolithiasis, malignant melanoma, hyperlipidemia, hypertension, iron deficiency anemia, nocturia who was recently in the emergency department due to a painful nephrolithiasis who was brought to emergency department due to altered mental status.  LK valvulae was 1800 yesterday when he went to bed.  His wife went to walk him up to take him to an appointment with urology and he was dysarthric, but she thought he was just sleepy.  She went to get him up again and noticed that he was dysarthric.  Apparently there is no focal weakness.  He is able to follow simple commands, but unable to provide further information.  Able to signal that he has no headache, chest, back or abdominal pain at the moment.  Lab work: CBC showed a white count 8.1, hemoglobin 15.3 g/dL platelets 139.  Normal PT, INR and PTT.  Normal alcohol level.  CMP showed a sodium 132 and chloride of 96 mmol/L, total bilirubin 1.4, glucose 180 and creatinine 2.01 mg/dL.  The rest of the CMP measurements were normal.  Imaging: CT head code stroke showed an age-indeterminate lacunar infarct in the left thalamus.  There is a dense atherosclerotic calcification in the left carotid terminus with extension into the proximal left MCA M1 segment.  No hyperdense vessel.  CT angio head and neck with no large vessel occlusion or hemodynamically significant stenosis in the neck.  There is moderate stenosis of the right vertebral artery V4 segment.  Mild stenosis of the left MCA M1  segment.  Aortic atherosclerosis.  MRI of the brain was intermittently motion degraded.  No evidence of acute intracranial abnormality.  Mild chronic small vessel ischemic changes within the cerebral white matter.  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  Small chronic infarct within the right cerebellar hemisphere.  Mild cerebral atrophy.  Incompletely assessed cervical spondylosis.  C3-C4 level a central disc protrusion contributes to at least moderate spinal canal stenosis, contacting and flattening the ventral aspect of the spinal cord.  Nonemergent MRI of the cervical spine should be considered for further evaluation.  There is mild mucosal thickening within the bilateral ethmoid sinuses.  Please see images and full radiology report for further details.  ED course: Initial vital signs were temperature 98.6 F, pulse 76, respiration 20, BP 110/60 mmHg O2 sat 99% on room air.   Review of Systems: As mentioned in the history of present illness. All other systems reviewed and are negative. Past Medical History:  Diagnosis Date   Anticoagulated    plavix   Arthritis    Shoulder, knees, back    CAD in native artery cardiologist-  dr Angelena Form   a. CAD/NSTEMI ,  cardiac cath staged stenting-- 12-25-2016  PTCA and DES x3 to prox. and mid LAD;  12-26-2016  PCI to PLA and DES x1 to midRCA,  EF 60-65%.   Chronic lower back pain    CKD (chronic kidney disease), stage III (HCC)    Complication of anesthesia    "Too much with shoulder surgery", pt. reports  that he was told the at they "lost him, due to absorbing too much anesthesia".  shoulder surgery 1985   DDD (degenerative disc disease), lumbosacral    GERD (gastroesophageal reflux disease)    History of gastric ulcer 01/2017   History of kidney stones    History of malignant melanoma    right side of nose   History of non-ST elevation myocardial infarction (NSTEMI) 12/23/2017   s/p  staged cardiac cath,  s/p  PCI and DEStenting    Hyperlipidemia    Hypertension    IDA (iron deficiency anemia)    Iron deficiency anemia due to chronic blood loss 10/05/2021   Myocardial infarction (HCC)    x 2   Neuromuscular disorder (HCC)    neuropathy   Nocturia    RBBB    Renal calculus, right    S/P drug eluting coronary stent placement 12-25-2017,  12-26-2017   PTCA and DES x3 to prox. and mid LAD;  PCI to PLA and DES x1 to midRCA   Type 2 diabetes mellitus treated with insulin ([place])    followed by pcp   Past Surgical History:  Procedure Laterality Date   ANAL FISSURE REPAIR  X 2   BACK SURGERY     CATARACT EXTRACTION W/ INTRAOCULAR LENS  IMPLANT, BILATERAL Bilateral 2017;  2015   COLONOSCOPY WITH PROPOFOL N/A 06/05/2016   Procedure: COLONOSCOPY WITH PROPOFOL;  Surgeon: Garlan Fair, MD;  Location: [facility];  Service: Endoscopy;  Laterality: N/A;   CONVERSION TO TOTAL KNEE Right 08/25/2020   Procedure: Revision right knee unicompartmental arthroplasty to total knee arthroplasty;  Surgeon: Gaynelle Arabian, MD;  Location: [facility];  Service: Orthopedics;  Laterality: Right;   CORONARY ANGIOGRAPHY N/A 12/26/2016   Procedure: CORONARY ANGIOGRAPHY;  Surgeon: Troy Sine, MD;  Location: [facility];  Service: Cardiovascular;  Laterality: N/A;   CORONARY STENT INTERVENTION N/A 12/25/2016   Procedure: CORONARY STENT INTERVENTION;  Surgeon: Burnell Blanks, MD;  Location: [facility];  Service: Cardiovascular;  Laterality: N/A;   CORONARY STENT INTERVENTION N/A 12/26/2016   Procedure: CORONARY STENT INTERVENTION;  Surgeon: Troy Sine, MD;  Location: [facility];  Service: Cardiovascular;  Laterality: N/A;   CYSTOSCOPY/URETEROSCOPY/HOLMIUM LASER/STENT PLACEMENT Right 01/07/2018   Procedure: RIGHT URETEROSCOPY/HOLMIUM LASER/STENT PLACEMENT;  Surgeon: Lucas Mallow, MD;  Location: [facility];  Service: Urology;  Laterality: Right;   ESOPHAGOGASTRODUODENOSCOPY (EGD) WITH  PROPOFOL N/A 01/12/2017   Procedure: ESOPHAGOGASTRODUODENOSCOPY (EGD) WITH PROPOFOL;  Surgeon: Wilford Corner, MD;  Location: [place];  Service: Endoscopy;  Laterality: N/A;   HAND TENDON SURGERY Right 10-29-2002   dr Burney Gauze @[facility]$    right index and thumb   KNEE ARTHROSCOPY Bilateral 2009-;2010   @[facility]$    LEFT HEART CATH AND CORONARY ANGIOGRAPHY N/A 12/25/2016   Procedure: LEFT HEART CATH AND CORONARY ANGIOGRAPHY;  Surgeon: Burnell Blanks, MD;  Location: [place];  Service: Cardiovascular;  Laterality: N/A;   LEFT HEART CATH AND CORONARY ANGIOGRAPHY N/A 10/10/2019   Procedure: LEFT HEART CATH AND CORONARY ANGIOGRAPHY;  Surgeon: Troy Sine, MD;  Location: [facility];  Service: Cardiovascular;  Laterality: N/A;   LUMBAR LAMINECTOMY/DECOMPRESSION MICRODISCECTOMY N/A 06/09/2015   Procedure: Laminectomy - T12-L1;  Surgeon: Eustace Moore, MD;  Location: [facility];  Service: Neurosurgery;  Laterality: N/A;  Laminectomy - T12-L1   MEDIAL PARTIAL KNEE REPLACEMENT Bilateral 2009-2010    [facility]    MELANOMA EXCISION Right    "  side of my nose"   REVERSE SHOULDER ARTHROPLASTY Right 03/31/2021   Procedure: REVERSE SHOULDER ARTHROPLASTY;  Surgeon: Justice Britain, MD;  Location: [facility];  Service: Orthopedics;  Laterality: Right;  14mn   SHOULDER SURGERY Left 1985   TOTAL SHOULDER ARTHROPLASTY Left 12/06/2012   Procedure: LEFT TOTAL SHOULDER ARTHROPLASTY VERSES A REVERSE TOTAL SHOULDER ARTHROPLASTY;  Surgeon: SAugustin Schooling MD;  [facility]  Service: Orthopedics;  Laterality: Left;   Social History:  reports that he has never smoked. He has never used smokeless tobacco. He reports that he does not drink alcohol and does not use drugs.  Allergies  Allergen Reactions   Statins Other (See Comments)    Severe stomach pain.   Oxycodone Itching   Hydrocodone Other (See Comments)    "messes with my hearing"   Codeine Itching   Morphine And Related Itching     Family History  Problem Relation Age of Onset   CAD Father    Heart failure Father    CAD Sister    Stroke Mother    Heart failure Mother     Prior to Admission medications   Medication Sig Start Date End Date Taking? Authorizing Provider  acetaminophen (TYLENOL) 500 MG tablet Take 1,000 mg by mouth every 6 (six) hours as needed for mild pain.   Yes [provider]  amoxicillin (AMOXIL) 500 MG capsule Take 2,000 mg by mouth See admin instructions. Take 2000 mg by mouth 1 hour prior to dental work 11/19/19  Yes [provider]  aspirin EC 81 MG tablet Take 1 tablet (81 mg total) by mouth daily. Swallow whole. 09/12/21  Yes MBurnell Blanks MD  cyclobenzaprine (FLEXERIL) 10 MG tablet Take 1 tablet (10 mg total) by mouth 3 (three) times daily as needed for muscle spasms. 03/31/21  Yes Shuford, TOlivia Mackie PA-C  empagliflozin (JARDIANCE) 25 MG TABS tablet Take 12.5 mg by mouth daily.   Yes [provider]  ezetimibe (ZETIA) 10 MG tablet Take 1 tablet (10 mg total) by mouth daily. 09/08/20  Yes MBurnell Blanks MD  ferrous gluconate (FERGON) 324 MG tablet Take 324 mg by mouth 3 (three) times a week. Mon, Wed, friday   Yes [provider]  insulin aspart protamine- aspart (NOVOLOG MIX 70/30) (70-30) 100 UNIT/ML injection Inject 10 Units into the skin 2 (two) times daily with a meal.   Yes [provider]  isosorbide mononitrate (IMDUR) 60 MG 24 hr tablet Take 60 mg by mouth daily.   Yes [provider]  losartan (COZAAR) 50 MG tablet Take 1 tablet (50 mg total) by mouth daily. Patient taking differently: Take 50 mg by mouth at bedtime. 01/14/20  Yes NCassandria Anger MD  metFORMIN (GLUCOPHAGE XR) 500 MG 24 hr tablet Take 1 tablet (500 mg total) by mouth daily with breakfast. 02/10/20  Yes Nida, GMarella Chimes MD  nitroGLYCERIN (NITROSTAT) 0.4 MG SL tablet Place 1 tablet (0.4 mg total) under the tongue every 5 (five) minutes as  needed for chest pain. 12/27/16  Yes Rama, CVenetia Maxon MD  Omega-3 Fatty Acids (FISH OIL) 1000 MG CAPS Take 1,000 mg by mouth daily.   Yes [provider]  ondansetron (ZOFRAN) 4 MG tablet Take 1 tablet (4 mg total) by mouth every 8 (eight) hours as needed for nausea or vomiting. 03/31/21  Yes Shuford, TOlivia Mackie PA-C  pantoprazole (PROTONIX) 40 MG tablet Take 40 mg by mouth daily.   Yes [provider]  rosuvastatin (CRESTOR)  10 MG tablet Take 5 mg by mouth at bedtime.   Yes [provider]  tamsulosin (FLOMAX) 0.4 MG CAPS capsule Take 0.4 mg by mouth daily after supper.   Yes [provider]  Vitamin D, Cholecalciferol, 25 MCG (1000 UT) CAPS Take 1,000 Units by mouth daily.    Yes [provider]  Zinc 50 MG CAPS Take 50 mg by mouth daily.   Yes [provider]  Continuous Blood Gluc Receiver (FREESTYLE LIBRE 2 READER) DEVI As directed 03/31/20   Cassandria Anger, MD  Continuous Blood Gluc Sensor (FREESTYLE LIBRE 2 SENSOR) MISC 1 Piece by Does not apply route every 14 (fourteen) days. 11/30/20   Cassandria Anger, MD  metoprolol tartrate (LOPRESSOR) 25 MG tablet TAKE 1 TABLET BY MOUTH TWICE A DAY Patient not taking: Reported on 03/20/2022 01/18/18   Consuelo Pandy, PA-C    Physical Exam: Vitals:   03/20/22 1300 03/20/22 1315 03/20/22 1430 03/20/22 1435  BP: (!) 136/57 (!) 189/113 (!) 182/92   Pulse: 63 83 73 70  Resp: 13 15 18 18  $ Temp:      TempSrc:      SpO2: 96% 99% 97% 96%   Physical Exam Vitals and nursing note reviewed.  Constitutional:      Appearance: Normal appearance.  HENT:     Head: Normocephalic.     Nose: No rhinorrhea.     Mouth/Throat:     Mouth: Mucous membranes are moist.  Eyes:     General: No scleral icterus.    Pupils: Pupils are equal, round, and reactive to light.  Cardiovascular:     Rate and Rhythm: Normal rate and regular rhythm.  Pulmonary:     Effort: Pulmonary effort is normal.      Breath sounds: Normal breath sounds.  Abdominal:     General: Bowel sounds are normal. There is no distension.     Palpations: Abdomen is soft.     Tenderness: There is no abdominal tenderness.  Musculoskeletal:     Cervical back: Neck supple.     Right lower leg: No edema.     Left lower leg: No edema.  Skin:    General: Skin is warm and dry.  Neurological:     General: No focal deficit present.     Mental Status: He is alert.     Cranial Nerves: Dysarthria present.     Sensory: No sensory deficit.     Motor: No weakness.     Comments: Motor aphasia.  He understands simple commands.  No gross Sensoready or motor deficit.  Psychiatric:        Mood and Affect: Mood normal.     Data Reviewed:  Results are pending, will review when available.  Assessment and Plan: Principal Problem:   Acute CVA (cerebrovascular accident) [facility]) Telemetry/inpatient. Frequent neurochecks. Consult PT and OT. Check fasting lipids. Check hemoglobin A1c. Check carotid Doppler. Check echocardiogram. Check 10 mg p.o. daily.  Brain. Risk factors modifications. Stroke team input appreciated.  Active Problems:   DM type 2 causing vascular disease (HCC) Carbohydrate modified diet. Check hemoglobin A1c. Continue metformin 500 mg with breakfast. Continue 70/30 insulin 10 units twice daily. Continue Jardiance 10 mg p.o. daily.    Essential hypertension, benign Allow permissive hypertension. Hold antihypertensives for now. See neurology notes for further guidelines.    CAD S/P percutaneous coronary angioplasty Continue aspirin and rosuvastatin. Allowing permissive hypertension. Will hold beta-blocker and long-acting nitrates.    Mixed hyperlipidemia  Continue rosuvastatin 5 mg p.o. daily. Continue ezetimibe 10 mg p.o. daily.     Advance Care Planning:   Code Status: Full Code   Consults: Neurology Su Monks, MD).  Family Communication: His wife was at bedside.  Severity of  Illness: The appropriate patient status for this patient is INPATIENT. Inpatient status is judged to be reasonable and necessary in order to provide the required intensity of service to ensure the patient's safety. The patient's presenting symptoms, physical exam findings, and initial radiographic and laboratory data in the context of their chronic comorbidities is felt to place them at high risk for further clinical deterioration. Furthermore, it is not anticipated that the patient will be medically stable for discharge from the hospital within 2 midnights of admission.   * I certify that at the point of admission it is my clinical judgment that the patient will require inpatient hospital care spanning beyond 2 midnights from the point of admission due to high intensity of service, high risk for further deterioration and high frequency of surveillance required.*  Author: Reubin Milan, MD 03/20/2022 3:07 PM  For on call review [email or web address].   This document was prepared using Dragon voice recognition software and may contain some unintended transcription errors.

## 2022-03-21 ENCOUNTER — Inpatient Hospital Stay (HOSPITAL_BASED_OUTPATIENT_CLINIC_OR_DEPARTMENT_OTHER): Payer: Medicare Other

## 2022-03-21 ENCOUNTER — Other Ambulatory Visit: Payer: Self-pay | Admitting: Urology

## 2022-03-21 ENCOUNTER — Inpatient Hospital Stay (HOSPITAL_COMMUNITY): Payer: Medicare Other

## 2022-03-21 DIAGNOSIS — R4182 Altered mental status, unspecified: Secondary | ICD-10-CM | POA: Diagnosis not present

## 2022-03-21 DIAGNOSIS — Z9861 Coronary angioplasty status: Secondary | ICD-10-CM

## 2022-03-21 DIAGNOSIS — E782 Mixed hyperlipidemia: Secondary | ICD-10-CM | POA: Diagnosis not present

## 2022-03-21 DIAGNOSIS — I6389 Other cerebral infarction: Secondary | ICD-10-CM | POA: Diagnosis not present

## 2022-03-21 DIAGNOSIS — I2510 Atherosclerotic heart disease of native coronary artery without angina pectoris: Secondary | ICD-10-CM

## 2022-03-21 DIAGNOSIS — I639 Cerebral infarction, unspecified: Secondary | ICD-10-CM | POA: Diagnosis not present

## 2022-03-21 DIAGNOSIS — I10 Essential (primary) hypertension: Secondary | ICD-10-CM | POA: Diagnosis not present

## 2022-03-21 DIAGNOSIS — E1159 Type 2 diabetes mellitus with other circulatory complications: Secondary | ICD-10-CM

## 2022-03-21 LAB — LIPID PANEL
Cholesterol: 132 mg/dL (ref 0–200)
HDL: 34 mg/dL — ABNORMAL LOW (ref >40)
LDL Cholesterol: 70 mg/dL (ref 0–99)
Total CHOL/HDL Ratio: 3.9 RATIO
Triglycerides: 142 mg/dL (ref <150)
VLDL: 28 mg/dL (ref 0–40)

## 2022-03-21 LAB — CBC
HCT: 38.5 % — ABNORMAL LOW (ref 39.0–52.0)
Hemoglobin: 13.1 g/dL (ref 13.0–17.0)
MCH: 30.4 pg (ref 26.0–34.0)
MCHC: 34 g/dL (ref 30.0–36.0)
MCV: 89.3 fL (ref 80.0–100.0)
Platelets: 109 10*3/uL — ABNORMAL LOW (ref 150–400)
RBC: 4.31 MIL/uL (ref 4.22–5.81)
RDW: 12.6 % (ref 11.5–15.5)
WBC: 5.5 10*3/uL (ref 4.0–10.5)
nRBC: 0 % (ref 0.0–0.2)

## 2022-03-21 LAB — ECHOCARDIOGRAM COMPLETE
Area-P 1/2: 1.76 cm2
Calc EF: 64.7 %
Height: 69 in
S' Lateral: 2.2 cm
Single Plane A2C EF: 67.5 %
Single Plane A4C EF: 65.4 %
Weight: 2758.4 oz

## 2022-03-21 LAB — BASIC METABOLIC PANEL
Anion gap: 12 (ref 5–15)
BUN: 23 mg/dL (ref 8–23)
CO2: 27 mmol/L (ref 22–32)
Calcium: 8.9 mg/dL (ref 8.9–10.3)
Chloride: 97 mmol/L — ABNORMAL LOW (ref 98–111)
Creatinine, Ser: 2.42 mg/dL — ABNORMAL HIGH (ref 0.61–1.24)
GFR, Estimated: 26 mL/min — ABNORMAL LOW (ref >60)
Glucose, Bld: 179 mg/dL — ABNORMAL HIGH (ref 70–99)
Potassium: 4.1 mmol/L (ref 3.5–5.1)
Sodium: 136 mmol/L (ref 135–145)

## 2022-03-21 LAB — COMPREHENSIVE METABOLIC PANEL
ALT: 12 U/L (ref 0–44)
AST: 12 U/L — ABNORMAL LOW (ref 15–41)
Albumin: 3 g/dL — ABNORMAL LOW (ref 3.5–5.0)
Alkaline Phosphatase: 65 U/L (ref 38–126)
Anion gap: 13 (ref 5–15)
BUN: 21 mg/dL (ref 8–23)
CO2: 23 mmol/L (ref 22–32)
Calcium: 8.5 mg/dL — ABNORMAL LOW (ref 8.9–10.3)
Chloride: 99 mmol/L (ref 98–111)
Creatinine, Ser: 2.27 mg/dL — ABNORMAL HIGH (ref 0.61–1.24)
GFR, Estimated: 28 mL/min — ABNORMAL LOW (ref >60)
Glucose, Bld: 139 mg/dL — ABNORMAL HIGH (ref 70–99)
Potassium: 3.8 mmol/L (ref 3.5–5.1)
Sodium: 135 mmol/L (ref 135–145)
Total Bilirubin: 1.1 mg/dL (ref 0.3–1.2)
Total Protein: 5.6 g/dL — ABNORMAL LOW (ref 6.5–8.1)

## 2022-03-21 LAB — GLUCOSE, CAPILLARY
Glucose-Capillary: 146 mg/dL — ABNORMAL HIGH (ref 70–99)
Glucose-Capillary: 236 mg/dL — ABNORMAL HIGH (ref 70–99)
Glucose-Capillary: 155 mg/dL — ABNORMAL HIGH (ref 70–99)

## 2022-03-21 MED ORDER — CLOPIDOGREL BISULFATE 75 MG PO TABS: 75.0000 mg | ORAL_TABLET | Freq: Every day | ORAL | 3 refills | Status: DC

## 2022-03-21 MED ORDER — CLOPIDOGREL BISULFATE 75 MG PO TABS: 75.0000 mg | ORAL_TABLET | Freq: Every day | ORAL | 3 refills | Status: AC

## 2022-03-21 MED ORDER — ASPIRIN 81 MG PO TBEC: 81.0000 mg | DELAYED_RELEASE_TABLET | Freq: Every day | ORAL | 0 refills | Status: DC

## 2022-03-21 MED ORDER — ASPIRIN 81 MG PO TBEC: 81.0000 mg | DELAYED_RELEASE_TABLET | Freq: Every day | ORAL | 0 refills | Status: AC

## 2022-03-21 NOTE — Procedures (Addendum)
Patient Name: Matthew [name]  MRN: [id number]  Epilepsy Attending: Lora Havens  Referring Physician/Provider: Garvin Fila, MD  Date: 03/21/2022 Duration: 23.38 mins  Patient history: 82 yo man with hx CAD s/p NSTEMI, PCI and DES 2019 on plavix, CKD stage III, hx malignant melanoma, HL, HTN, DM2 BIB EMS for global aphasia.  EEG to evaluate for seizure.  Level of alertness: Awake  AEDs during EEG study: None  Technical aspects: This EEG study was done with scalp electrodes positioned according to the 10-20 International system of electrode placement. Electrical activity was reviewed with band pass filter of 1-70Hz$ , sensitivity of 7 uV/mm, display speed of 41m/sec with a 60Hz$  notched filter applied as appropriate. EEG data were recorded continuously and digitally stored.  Video monitoring was available and reviewed as appropriate.  Description: The posterior dominant rhythm consists of 8 Hz activity of moderate voltage (25-35 uV) seen predominantly in posterior head regions, symmetric and reactive to eye opening and eye closing. EEG showed intermittent generalized and lateralized left hemisphere 3 to 6 Hz theta-delta slowing.  Photic driving was not seen during photic stimulation.  Hyperventilation was not performed.     ABNORMALITY - Intermittent slow, generalized and lateralized left hemisphere   IMPRESSION: This study is suggestive of non-specific cortical dysfunction arising from left hemisphere. Additionally there is mild diffuse encephalopathy, nonspecific etiology. No seizures or epileptiform discharges were seen throughout the recording.  Matthew [name] [name]

## 2022-03-21 NOTE — Evaluation (Signed)
Physical Therapy Evaluation Patient Details Name: Matthew [name] MRN: [id number] DOB: Mar 19, 1940 Today's Date: 03/21/2022  History of Present Illness  Pt is an 82 y/o male who presented with dysarthria and lethargy. MRI brain showed age indeterminate lacunar infarct in L thalamus. PMH: OA, CAD, NSTEMI, chronic LBP, CKD3, GERD, malignant melanoma, HLD, HTN, anemia.  Clinical Impression  Pt presents today with impaired functional mobility, limited by balance and coordination. Pt independent with mobility at baseline with intermittent use of SPC but noted to have ~3 falls over the past 6 months. Pt noted to have staggering gait with path sway with ambulation and 1 LOB but able to self correct with external support, noted increase stability with 1HHA. Pt will continue to benefit from skilled acute PT to progress his balance and safety with mobility. Pt with full time care at home, recommend return home with HHPT at discharge to continue to progress mobility and decrease falls. Acute PT will continue to follow as appropriate.        Recommendations for follow up therapy are one component of a multi-disciplinary discharge planning process, led by the attending physician.  Recommendations may be updated based on patient status, additional functional criteria and insurance authorization.  Follow Up Recommendations Home health PT      Assistance Recommended at Discharge Intermittent Supervision/Assistance  Patient can return home with the following  A little help with walking and/or transfers;Assist for transportation;Help with stairs or ramp for entrance    Equipment Recommendations Other (comment) (shower chair)  Recommendations for Other Services       Functional Status Assessment Patient has had a recent decline in their functional status and demonstrates the ability to make significant improvements in function in a reasonable and predictable amount of time.     Precautions / Restrictions  Precautions Precautions: Fall Restrictions Weight Bearing Restrictions: No      Mobility  Bed Mobility Overal bed mobility: Modified Independent             General bed mobility comments: HOB elevated and side rail utilized    Transfers Overall transfer level: Needs assistance Equipment used: None Transfers: Sit to/from Stand Sit to Stand: Min guard           General transfer comment: minG provided for safety, mild sway noted with initial sway but quickly obtaining balance without need for external support    Ambulation/Gait Ambulation/Gait assistance: Min guard Gait Distance (Feet): 20 Feet Assistive device: 1 person hand held assist Gait Pattern/deviations: Step-through pattern, Drifts right/left, Decreased step length - left Gait velocity: WFL     General Gait Details: 1 LOB to the R without use of AD but pt correcting with reaching for door frame, pt provided 1HHA back to his bed with decreased sway.  Stairs            Wheelchair Mobility    Modified Rankin (Stroke Patients Only) Modified Rankin (Stroke Patients Only) Pre-Morbid Rankin Score: No symptoms Modified Rankin: Moderate disability     Balance Overall balance assessment: Needs assistance Sitting-balance support: No upper extremity supported, Feet supported Sitting balance-Leahy Scale: Good Sitting balance - Comments: stable sitting balance   Standing balance support: Single extremity supported, During functional activity Standing balance-Leahy Scale: Fair Standing balance comment: 1 LOB with ambulation without UE support but able to self correct  Pertinent Vitals/Pain Pain Assessment Pain Assessment: No/denies pain    Home Living Family/patient expects to be discharged to:: Private residence Living Arrangements: Spouse/significant other Available Help at Discharge: Family;Available 24 hours/day Type of Home: House Home Access: Stairs to  enter Entrance Stairs-Rails: None Entrance Stairs-Number of Steps: 2 to enter then +1 through sunroom   Home Layout: One level Home Equipment: Cane - single point;Shower [place] (2 wheels) Additional Comments: Wife reports [place] is too big and requests a smaller one    Prior Function Prior Level of Function : Independent/Modified Independent;Driving             Mobility Comments: pt intermittently uses his cane for mobility, wife reports baseline balance deficits and ~3 falls over the past 6 months       Hand Dominance   Dominant Hand: Right    Extremity/Trunk Assessment   Upper Extremity Assessment Upper Extremity Assessment: Defer to OT evaluation    Lower Extremity Assessment Lower Extremity Assessment: LLE deficits/detail LLE Deficits / Details: strength fairly equal from LLE to RLE but decreased coordination for heel to shin LLE Sensation: WNL LLE Coordination: decreased gross motor    Cervical / Trunk Assessment Cervical / Trunk Assessment: Normal  Communication   Communication: No difficulties  Cognition Arousal/Alertness: Awake/alert Behavior During Therapy: WFL for tasks assessed/performed Overall Cognitive Status: Impaired/Different from baseline Area of Impairment: Problem solving, Safety/judgement                         Safety/Judgement: Decreased awareness of deficits   Problem Solving: Slow processing, Requires verbal cues, Decreased initiation General Comments: A&Ox4, cueing for mobility required for safety, repeating of verbal cues for initiation with mobility        General Comments General comments (skin integrity, edema, etc.): VSS on room air, wife at bedside    Exercises     Assessment/Plan    PT Assessment Patient needs continued PT services  PT Problem List Decreased activity tolerance;Decreased balance;Decreased mobility;Decreased coordination;Decreased knowledge of use of DME;Decreased safety awareness        PT Treatment Interventions DME instruction;Gait training;Stair training;Functional mobility training;Therapeutic activities;Therapeutic exercise;Balance training;Neuromuscular re-education;Patient/family education    PT Goals (Current goals can be found in the Care Plan section)  Acute Rehab PT Goals Patient Stated Goal: go home PT Goal Formulation: With patient/family Time For Goal Achievement: 04/04/22 Potential to Achieve Goals: Good    Frequency Min 3X/week     Co-evaluation               AM-PAC PT "6 Clicks" Mobility  Outcome Measure Help needed turning from your back to your side while in a flat bed without using bedrails?: None Help needed moving from lying on your back to sitting on the side of a flat bed without using bedrails?: None Help needed moving to and from a bed to a chair (including a wheelchair)?: A Little Help needed standing up from a chair using your arms (e.g., wheelchair or bedside chair)?: A Little Help needed to walk in hospital room?: A Little Help needed climbing 3-5 steps with a railing? : A Lot 6 Click Score: 19    End of Session Equipment Utilized During Treatment: Gait belt Activity Tolerance: Patient tolerated treatment well Patient left: in bed;with call bell/phone within reach;with family/visitor present Nurse Communication: Mobility status PT Visit Diagnosis: Unsteadiness on feet (R26.81);History of falling (Z91.81)    Time: [id number] PT Time Calculation (min) (ACUTE ONLY): 13 min  Charges:   PT Evaluation $PT Eval Low Complexity: 1 Low          Charlynne Cousins, PT DPT Acute Rehabilitation Services Office [phone]   Luvenia Heller 03/21/2022, 12:50 PM

## 2022-03-21 NOTE — Progress Notes (Signed)
Patient in [place]: [phone]

## 2022-03-21 NOTE — Progress Notes (Signed)
Echocardiogram 2D Echocardiogram has been performed.  Matthew [name] 03/21/2022, 2:35 PM

## 2022-03-21 NOTE — Discharge Summary (Signed)
Physician Discharge Summary   Patient: Matthew [name] MRN: [id number] DOB: Apr 03, 1940  Admit date:     03/20/2022  Discharge date: 03/21/22  Discharge Physician: Oswald Hillock   PCP: Wenda Low, MD   Recommendations at discharge:   Follow-up urology, Dr. Gloriann Loan on 03/22/2022 at 8 AM Follow-up with neurosurgery as outpatient for cervical spine MRI  Discharge Diagnoses: Principal Problem:   Acute CVA (cerebrovascular accident) [facility]) Active Problems:   DM type 2 causing vascular disease ([place])   Essential hypertension, benign   CAD S/P percutaneous coronary angioplasty   Mixed hyperlipidemia  Resolved Problems:   * No resolved hospital problems. *  Hospital Course: 82 year old male with medical history significant of osteoarthritis, CAD, NSTEMI x 2, PTCA and DES x 3, chronic lower back pain, stage III CKD, lumbosacral DDD, GERD, PUD, nephrolithiasis, malignant melanoma, hyperlipidemia, hypertension, iron deficiency anemia, nocturia who was recently in the emergency department due to a painful nephrolithiasis who was brought to emergency department due to altered mental status. Patient was found to be dysarthric and sleepy yesterday. CT head showed a indeterminate level infarct in left thalamus. Incompletely assessed cervical spondylosis.  At C3-C4 central disc protrusion contributes to at least moderate spinal canal stenosis, contacting and flattening the ventral aspect of the spinal cord.  Nonemergent MRI of cervical spine should be considered for further evaluation as outpatient.  Assessment and Plan:  Toxic metabolic encephalopathy -Insetting of kidney stone, dehydration, opioids -Resolved -CT head was negative for acute stroke -CT head and neck showed no LVO or hemodynamically significant stenosis -MRI showed no acute infarct, showed chronic lacunar infarct left thalamus, chronic infarct in right cerebellar hemisphere -Echocardiogram showed grade 1 diastolic dysfunction, EF  65 to 70% -Neurology was consulted; recommend aspirin 81 mg daily prior to admission, now on aspirin 81 mg daily and clopidogrel 75 mg daily for 3 weeks followed by Plavix alone. -Home health PT ordered   Left UVJ stone, 8 mm -He was supposed to follow-up with Dr. Milford Cage on 03/20/2022 for possible lithotripsy as outpatient -Creatinine 2.42 -Discussed with Dr. Gloriann Loan, urology -Patient has an appointment with Dr. Gloriann Loan on 03/22/2022 at 8 AM  Diabetes mellitus type 2 -Will discontinue metformin due to worsening renal function -Continue insulin with home regimen  Hypertension -Continue home regimen  CAD s/p percutaneous coronary angioplasty -Continue aspirin, rosuvastatin  Hyperlipidemia -Continue rosuvastatin, Zetia          Consultants: Neurology Procedures performed:  Disposition: Home Diet recommendation:  Discharge Diet Orders (From admission, onward)     Start     Ordered   03/21/22 0000  Diet - low sodium heart healthy        03/21/22 1622           Carb modified diet DISCHARGE MEDICATION: Allergies as of 03/21/2022       Reactions   Statins Other (See Comments)   Severe stomach pain.   Oxycodone Itching   Hydrocodone Other (See Comments)   "messes with my hearing"   Codeine Itching   Morphine And Related Itching        Medication List     STOP taking these medications    cyclobenzaprine 10 MG tablet Commonly known as: FLEXERIL   metFORMIN 500 MG 24 hr tablet Commonly known as: Glucophage XR       TAKE these medications    acetaminophen 500 MG tablet Commonly known as: TYLENOL Take 1,000 mg by mouth every 6 (six) hours as needed for mild  pain.   amoxicillin 500 MG capsule Commonly known as: AMOXIL Take 2,000 mg by mouth See admin instructions. Take 2000 mg by mouth 1 hour prior to dental work   aspirin EC 81 MG tablet Take 1 tablet (81 mg total) by mouth daily for 21 days. Then stop aspirin and continue taking Plavix What changed:  additional instructions   clopidogrel 75 MG tablet Commonly known as: PLAVIX Take 1 tablet (75 mg total) by mouth daily. Take aspirin and Plavix together for 21 days, then stop aspirin and continue taking Plavix indefinitely. Start taking on: March 22, 2022   ezetimibe 10 MG tablet Commonly known as: ZETIA Take 1 tablet (10 mg total) by mouth daily.   ferrous gluconate 324 MG tablet Commonly known as: FERGON Take 324 mg by mouth 3 (three) times a week. Mon, Wed, friday   Fish Oil 1000 MG Caps Take 1,000 mg by mouth daily.   FreeStyle Libre 2 Reader Kerrin Mo As directed   [employer] 2 Sensor Misc 1 Piece by Does not apply route every 14 (fourteen) days.   insulin aspart protamine- aspart (70-30) 100 UNIT/ML injection Commonly known as: NOVOLOG MIX 70/30 Inject 10 Units into the skin 2 (two) times daily with a meal.   isosorbide mononitrate 60 MG 24 hr tablet Commonly known as: IMDUR Take 60 mg by mouth daily.   Jardiance 25 MG Tabs tablet Generic drug: empagliflozin Take 12.5 mg by mouth daily.   losartan 50 MG tablet Commonly known as: COZAAR Take 1 tablet (50 mg total) by mouth daily. What changed: when to take this   metoprolol tartrate 25 MG tablet Commonly known as: LOPRESSOR TAKE 1 TABLET BY MOUTH TWICE A DAY   nitroGLYCERIN 0.4 MG SL tablet Commonly known as: NITROSTAT Place 1 tablet (0.4 mg total) under the tongue every 5 (five) minutes as needed for chest pain.   ondansetron 4 MG tablet Commonly known as: Zofran Take 1 tablet (4 mg total) by mouth every 8 (eight) hours as needed for nausea or vomiting.   pantoprazole 40 MG tablet Commonly known as: PROTONIX Take 40 mg by mouth daily.   rosuvastatin 10 MG tablet Commonly known as: CRESTOR Take 5 mg by mouth at bedtime.   tamsulosin 0.4 MG Caps capsule Commonly known as: FLOMAX Take 0.4 mg by mouth daily after supper.   Vitamin D (Cholecalciferol) 25 MCG (1000 UT) Caps Take 1,000 Units by  mouth daily.   Zinc 50 MG Caps Take 50 mg by mouth daily.        Follow-up Information     [facility] Follow up.   Why: Call [facility] if you haven't received a call from them within 2 days of dscharge from hospital Contact information: [place] [address] [phone]         [facility] Follow up.   Why: Midwife information: [id number]  [place], [place]  [address]        Lucas Mallow, MD Follow up on 03/22/2022.   Specialty: Urology Why: [facility] at 8 AM.  Do not eat anything after midnight for possible surgery in AM. Contact information: [place] [address] [phone]                Discharge Exam: Danley Danker Weights   03/20/22 1824  Weight: 78.2 kg   Appears in no acute distress S1-S2, regular Lungs clear to auscultation bilaterally  Condition at discharge: good  The results of significant diagnostics from this hospitalization (including imaging, microbiology, ancillary and laboratory) are listed below for reference.   Imaging Studies: ECHOCARDIOGRAM COMPLETE  Result Date: 03/21/2022    ECHOCARDIOGRAM REPORT   Patient Name:   GEZA HARGAN Date of Exam: 03/21/2022 Medical Rec #:  [id number]        Height:       69.0 in Accession #:    [id number]       Weight:       172.4 lb Date of Birth:  April 28, 1940        BSA:          1.940 m Patient Age:    23 years         BP:           137/62 mmHg Patient Gender: M                HR:           70 bpm. Exam Location:  Inpatient Procedure: 2D Echo, Cardiac Doppler and Color Doppler Indications:    Stroke  History:        Patient has prior history of Echocardiogram examinations. CAD                 and Previous Myocardial Infarction, Arrythmias:RBBB; Risk                 Factors:Hypertension, Diabetes and Dyslipidemia.  Sonographer:    Phineas Douglas Referring Phys: [id number] [place]  1. Left ventricular ejection fraction, by estimation, is 65 to 70%. The left ventricle has normal function. The left ventricle has no regional wall motion abnormalities. There is mild concentric left ventricular hypertrophy. Left ventricular diastolic parameters are consistent with Grade I diastolic dysfunction (impaired relaxation).  2. Right ventricular systolic function is normal. The right ventricular size is normal. There is normal pulmonary artery systolic pressure.  3. Left atrial size was mildly dilated.  4. The mitral valve is grossly normal. Trivial mitral valve regurgitation. No evidence of mitral stenosis.  5. The aortic valve is tricuspid. There is mild calcification of the aortic valve. There is moderate thickening of the aortic valve. Aortic valve regurgitation is not visualized. Aortic valve sclerosis/calcification is present, without any evidence of aortic stenosis.  6. Aortic dilatation noted. There is borderline dilatation of the ascending aorta, measuring 38 mm.  7. The inferior vena cava is normal in size with greater than 50% respiratory variability, suggesting right atrial pressure of 3 mmHg. Comparison(s): No significant change from prior study. Conclusion(s)/Recommendation(s): No intracardiac source of embolism detected on this transthoracic study. Consider a transesophageal echocardiogram to exclude cardiac source of embolism if clinically indicated. FINDINGS  Left Ventricle: Left ventricular ejection fraction, by estimation, is 65 to 70%. The left ventricle has normal function. The left ventricle has no regional wall motion abnormalities. The left ventricular internal cavity size was normal in size. There is  mild concentric left ventricular hypertrophy. Left ventricular diastolic parameters are consistent with Grade I diastolic dysfunction (impaired relaxation). Right Ventricle: The right ventricular size is normal. No increase in right ventricular wall thickness. Right  ventricular systolic function is normal. There is normal pulmonary artery systolic pressure. The tricuspid regurgitant velocity is 2.72 m/s, and  with an assumed right atrial pressure of 3 mmHg, the estimated right ventricular systolic pressure is [id number] mmHg. Left Atrium: Left atrial size was mildly dilated. Right Atrium: Right atrial size  was normal in size. Pericardium: There is no evidence of pericardial effusion. Mitral Valve: The mitral valve is grossly normal. There is mild thickening of the mitral valve leaflet(s). Trivial mitral valve regurgitation. No evidence of mitral valve stenosis. Tricuspid Valve: The tricuspid valve is normal in structure. Tricuspid valve regurgitation is mild. Aortic Valve: The aortic valve is tricuspid. There is mild calcification of the aortic valve. There is moderate thickening of the aortic valve. Aortic valve regurgitation is not visualized. Aortic valve sclerosis/calcification is present, without any evidence of aortic stenosis. Aorta: Aortic dilatation noted. There is borderline dilatation of the ascending aorta, measuring 38 mm. Venous: The inferior vena cava is normal in size with greater than 50% respiratory variability, suggesting right atrial pressure of 3 mmHg. IAS/Shunts: The atrial septum is grossly normal.  LEFT VENTRICLE PLAX 2D LVIDd:         3.40 cm      Diastology LVIDs:         2.20 cm      LV e' medial:    5.55 cm/s LV PW:         1.20 cm      LV E/e' medial:  13.5 LV IVS:        1.20 cm      LV e' lateral:   6.74 cm/s LVOT diam:     2.00 cm      LV E/e' lateral: 11.1 LV SV:         96 LV SV Index:   50 LVOT Area:     3.14 cm  LV Volumes (MOD) LV vol d, MOD A2C: 114.0 ml LV vol d, MOD A4C: 105.0 ml LV vol s, MOD A2C: 37.1 ml LV vol s, MOD A4C: 36.3 ml LV SV MOD A2C:     76.9 ml LV SV MOD A4C:     105.0 ml LV SV MOD BP:      71.6 ml RIGHT VENTRICLE             IVC RV Basal diam:  4.60 cm     IVC diam: 1.80 cm RV S prime:     17.60 cm/s TAPSE (M-mode): 2.5 cm LEFT  ATRIUM             Index        RIGHT ATRIUM           Index LA diam:        3.30 cm 1.70 cm/m   RA Area:     21.30 cm LA Vol (A2C):   75.3 ml 38.82 ml/m  RA Volume:   60.10 ml  30.99 ml/m LA Vol (A4C):   57.4 ml 29.59 ml/m LA Biplane Vol: 65.8 ml 33.92 ml/m  AORTIC VALVE LVOT Vmax:   126.00 cm/s LVOT Vmean:  85.500 cm/s LVOT VTI:    0.306 m  AORTA Ao Root diam: 3.40 cm Ao Asc diam:  3.80 cm MITRAL VALVE               TRICUSPID VALVE MV Area (PHT): 1.76 cm    TR Peak grad:   29.6 mmHg MV Decel Time: 432 msec    TR Vmax:        272.00 cm/s MV E velocity: 75.00 cm/s MV A velocity: 99.80 cm/s  SHUNTS MV E/A ratio:  0.75        Systemic VTI:  0.31 m  Systemic Diam: 2.00 cm Gwyndolyn Kaufman MD Electronically signed by Gwyndolyn Kaufman MD Signature Date/Time: 03/21/2022/2:42:06 PM    Final    EEG adult  Result Date: 03/21/2022 Lora Havens, MD     03/21/2022 11:05 AM Patient Name: HOOPER GRIESEL MRN: [id number] Epilepsy Attending: Lora Havens Referring Physician/Provider: Garvin Fila, MD Date: 03/21/2022 Duration: 23.38 mins Patient history: 82 yo man with hx CAD s/p NSTEMI, PCI and DES 2019 on plavix, CKD stage III, hx malignant melanoma, HL, HTN, DM2 BIB EMS for global aphasia.  EEG to evaluate for seizure. Level of alertness: Awake AEDs during EEG study: None Technical aspects: This EEG study was done with scalp electrodes positioned according to the 10-20 International system of electrode placement. Electrical activity was reviewed with band pass filter of 1-70Hz$ , sensitivity of 7 uV/mm, display speed of 33m/sec with a 60Hz$  notched filter applied as appropriate. EEG data were recorded continuously and digitally stored.  Video monitoring was available and reviewed as appropriate. Description: The posterior dominant rhythm consists of 8 Hz activity of moderate voltage (25-35 uV) seen predominantly in posterior head regions, symmetric and reactive to eye opening and eye  closing. EEG showed intermittent generalized and lateralized left hemisphere 3 to 6 Hz theta-delta slowing.  Photic driving was not seen during photic stimulation.  Hyperventilation was not performed.   ABNORMALITY - Intermittent slow, generalized and lateralized left hemisphere IMPRESSION: This study is suggestive of non-specific cortical dysfunction arising from left hemisphere. Additionally there is mild diffuse encephalopathy, nonspecific etiology. No seizures or epileptiform discharges were seen throughout the recording. PLora Havens  MR BRAIN WO CONTRAST  Result Date: 03/20/2022 CLINICAL DATA:  Provided history: Neuro deficit, acute, stroke suspected. EXAM: MRI HEAD WITHOUT CONTRAST TECHNIQUE: Multiplanar, multiecho pulse sequences of the brain and surrounding structures were obtained without intravenous contrast. COMPARISON:  Non-contrast head CT and CT angiogram head/neck performed earlier today 03/20/2022. FINDINGS: Intermittently motion degraded examination. Most notably, the axial susceptibility-weighted and coronal T2-weighted sequences are moderately motion degraded. Brain: Mild generalized cerebral atrophy. Multifocal T2 FLAIR hyperintense signal abnormality within the cerebral white matter, nonspecific but compatible with mild chronic small vessel ischemic disease. 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 Small chronic infarct within the right cerebellar hemisphere. There is no acute infarct. No evidence of an intracranial mass. No chronic intracranial blood products. No extra-axial fluid collection. No midline shift. Vascular: Maintained flow voids within the proximal large arterial vessels. Skull and upper cervical spine: No focal suspicious marrow lesion. Incompletely assessed cervical spondylosis. At C3-C4, a central disc protrusion contributes to at least moderate spinal canal stenosis, contacting and flattening the ventral aspect of spinal cord (series 7, image 12).  Sinuses/Orbits: No mass or acute finding within the imaged orbits. Prior bilateral ocular lens replacement. Mild mucosal thickening within bilateral ethmoid air cells. Other: Trace fluid within the bilateral mastoid air cells. IMPRESSION: 1. Intermittently motion degraded examination. 2. No evidence of an acute intracranial abnormality. Specifically, the diffusion-weighted imaging is of good quality and there is no evidence of an acute infarct. 3. Mild chronic small vessel ischemic changes within the cerebral white matter. 4. 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 5. Small chronic infarct within the right cerebellar hemisphere. 6. Mild generalized cerebral atrophy. 7. Incompletely assessed cervical spondylosis. At C3-C4, a central disc protrusion contributes to at least moderate spinal canal stenosis, contacting and flattening the ventral aspect of the spinal cord. A non-emergent MRI of the cervical spine should be considered  for further evaluation. 8. Mild mucosal thickening within the bilateral ethmoid sinuses. Electronically Signed   By: Kellie Simmering D.O.   On: 03/20/2022 14:01   CT ANGIO HEAD NECK W WO CM W PERF (CODE STROKE)  Result Date: 03/20/2022 CLINICAL DATA:  Neuro deficit, acute, stroke suspected. Aphasia. Unable to straighten right arm. EXAM: CT ANGIOGRAPHY HEAD AND NECK CT PERFUSION BRAIN TECHNIQUE: Multidetector CT imaging of the head and neck was performed using the standard protocol during bolus administration of intravenous contrast. Multiplanar CT image reconstructions and MIPs were obtained to evaluate the vascular anatomy. Carotid stenosis measurements (when applicable) are obtained utilizing NASCET criteria, using the distal internal carotid diameter as the denominator. Multiphase CT imaging of the brain was performed following IV bolus contrast injection. Subsequent parametric perfusion maps were calculated using RAPID software. RADIATION DOSE REDUCTION: This exam was performed  according to the departmental dose-optimization program which includes automated exposure control, adjustment of the mA and/or kV according to patient size and/or use of iterative reconstruction technique. CONTRAST:  134m OMNIPAQUE IOHEXOL 350 MG/ML SOLN COMPARISON:  Same-day CT head. FINDINGS: CTA NECK FINDINGS Aortic arch: Three-vessel arch configuration. Atherosclerotic calcifications of the aortic arch and arch vessel origins. Mild stenosis of the left subclavian artery origin. Right carotid system: Mild calcified plaque along the right common carotid. Predominantly calcified plaque at the carotid bulb and proximal right ICA without hemodynamically significant stenosis. Left carotid system: Unremarkable common carotid. Densely calcified plaque at the left carotid bulb and proximal left ICA without hemodynamically significant stenosis. Vertebral arteries: Calcified plaque results in mild stenosis of the right vertebral artery origin. Scattered calcified plaque along the V2 segments bilaterally. Densely calcified, circumferential plaque of the right V4 segment results in at least moderate stenosis. Mixed plaque along the left V4 segment results in mild stenosis. Skeleton: Multilevel cervical spondylosis, worst at C5-6, where there is at least moderate spinal canal stenosis. No suspicious bone lesions. Other neck: Streak artifact from bilateral reverse shoulder arthroplasties. Otherwise unremarkable. Upper chest: Unremarkable. Review of the MIP images confirms the above findings CTA HEAD FINDINGS Anterior circulation: Calcified plaque along the carotid siphons without hemodynamically significant stenosis. Mild stenosis of the left MCA origin. Vessel is otherwise patent proximally. The right MCA and bilateral ACAs are patent proximally. Distal branches are symmetric. Posterior circulation: Normal basilar artery. The SCAs, AICAs and PICAs are patent proximally. The PCAs are patent proximally without stenosis or  aneurysm. Distal branches are symmetric. Venous sinuses: As permitted by contrast timing, patent. Anatomic variants: Hypoplastic left A1 segment. Hypoplastic left P1 segment with persistent fetal origin of the left PCA. Review of the MIP images confirms the above findings CT Brain Perfusion Findings: ASPECTS: 10 CBF (<30%) Volume: 069mPerfusion (Tmax>6.0s) volume: 5340mThis is favored to be artifactual due to patient motion artifact in the 20-40 second window. Mismatch Volume: 5m58mgain favored to represent artifact. Infarction Location: Not applicable. IMPRESSION: 1. No large vessel occlusion or hemodynamically significant stenosis in the neck. 2. Perfusion imaging results are favored to be artifactual due to patient motion. 3. Moderate stenosis of the right vertebral artery V4 segment. Mild stenosis of the left MCA M1 segment. 4. Aortic Atherosclerosis (ICD10-I70.0). Code stroke imaging results were communicated on 03/20/2022 at 11:12 am to provider Dr. StacQuinn Axe telephone, who verbally acknowledged these results. Electronically Signed   By: WaltEmmit Alexanders.   On: 03/20/2022 11:26   CT HEAD CODE STROKE WO CONTRAST  Result Date: 03/20/2022 CLINICAL DATA:  Code stroke.  Neuro deficit, acute, stroke suspected. Aphasia. Last seen normal last night. Unable to straighten right arm. EXAM: CT HEAD WITHOUT CONTRAST TECHNIQUE: Contiguous axial images were obtained from the base of the skull through the vertex without intravenous contrast. RADIATION DOSE REDUCTION: This exam was performed according to the departmental dose-optimization program which includes automated exposure control, adjustment of the mA and/or kV according to patient size and/or use of iterative reconstruction technique. COMPARISON:  Head CT 05/13/2019. FINDINGS: Brain: Age-indeterminate lacunar infarct in the left thalamus. No acute intracranial hemorrhage. Gray-white differentiation is otherwise preserved. No hydrocephalus or extra-axial  collection. Vascular: No hyperdense vessel. Dense atherosclerotic calcification of the left carotid terminus with extension into the proximal left MCA M1 segment. Skull: No calvarial fracture or suspicious bone lesion. Skull base is unremarkable. Sinuses/Orbits: Unremarkable. Other: None. ASPECTS [facility] Stroke Program Early CT Score) - Ganglionic level infarction (caudate, lentiform nuclei, internal capsule, insula, M1-M3 cortex): 7 - Supraganglionic infarction (M4-M6 cortex): 3 Total score (0-10 with 10 being normal): 10 IMPRESSION: 1. No acute intracranial hemorrhage or evidence of large vessel territory infarction. ASPECT score is 10. 2. Age-indeterminate lacunar infarct in the left thalamus. 3. Dense atherosclerotic calcification of the left carotid terminus with extension into the proximal left MCA M1 segment. No hyperdense vessel. Code stroke imaging results were communicated on 03/20/2022 at 10:53 am to provider Dr. Pearline Cables via telephone, who verbally acknowledged these results. Electronically Signed   By: Emmit Alexanders M.D.   On: 03/20/2022 10:54    Microbiology: Results for orders placed or performed during the hospital encounter of 03/21/21  Surgical PCR Screen     Status: None   Collection Time: 03/21/21 11:04 AM   Specimen: Nasal Mucosa; Nasal Swab  Result Value Ref Range Status   MRSA, PCR NEGATIVE NEGATIVE Final   Staphylococcus aureus NEGATIVE NEGATIVE Final    Comment: (NOTE) The Xpert SA Assay (FDA approved for NASAL specimens in patients 73 years of age and older), is one component of a comprehensive surveillance program. It is not intended to diagnose infection nor to guide or monitor treatment. Performed at [facility], [place] [address] [place], [place] [address]     Labs: CBC: Recent Labs  Lab 03/20/22 1014 03/20/22 1509 03/21/22 0310  WBC 8.1  --  5.5  NEUTROABS 6.1  --   --   HGB 15.3 11.9* 13.1  HCT 45.2 35.0* 38.5*  MCV 89.0  --  89.3  PLT  139*  --  0000000*   Basic Metabolic Panel: Recent Labs  Lab 03/20/22 1014 03/20/22 1509 03/21/22 0310 03/21/22 1346  NA 132* 138 135 136  K 3.8 3.4* 3.8 4.1  CL 96* 101 99 97*  CO2 27  --  23 27  GLUCOSE 180* 137* 139* 179*  BUN 23 19 21 23  $ CREATININE 2.01* 1.60* 2.27* 2.42*  CALCIUM 8.9  --  8.5* 8.9   Liver Function Tests: Recent Labs  Lab 03/20/22 1014 03/21/22 0310  AST 16 12*  ALT 19 12  ALKPHOS 83 65  BILITOT 1.4* 1.1  PROT 7.4 5.6*  ALBUMIN 4.0 3.0*   CBG: Recent Labs  Lab 03/20/22 1012 03/21/22 0844 03/21/22 1232 03/21/22 1606  GLUCAP 166* 146* 236* 155*    Discharge time spent: greater than 30 minutes.  Signed: Oswald Hillock, MD [facility] 03/21/2022

## 2022-03-21 NOTE — Evaluation (Signed)
Speech Language Pathology Evaluation Patient Details Name: Matthew [name] MRN: [id number] DOB: 05-26-1940 Today's Date: 03/21/2022 Time: 1005-1020 SLP Time Calculation (min) (ACUTE ONLY): 15 min  Problem List:  Patient Active Problem List   Diagnosis Date Noted   Acute CVA (cerebrovascular accident) ([place]) 03/20/2022   Normocytic anemia 10/05/2021   Iron deficiency anemia due to chronic blood loss 10/05/2021   Pain due to unicompartmental arthroplasty of knee ([place]) 08/25/2020   Benign prostatic hyperplasia 06/22/2020   Chronic kidney disease, stage 3 unspecified ([place]) 06/22/2020   Coronary artery disease 06/22/2020   Gastro-esophageal reflux disease without esophagitis 06/22/2020   Kidney stone 06/22/2020   Long term current use of insulin ([place]) 06/22/2020   Polyneuropathy due to type 2 diabetes mellitus ([place]) 06/22/2020   Peripheral arterial disease ([place]) 11/19/2019   RBBB 01/16/2017   PUD (peptic ulcer disease) 01/16/2017   Melena 01/12/2017   Unstable angina ([place]) 01/11/2017   CAD S/P percutaneous coronary angioplasty 01/09/2017   Mixed hyperlipidemia 01/09/2017   DM type 2 causing vascular disease ([place]) 12/24/2016   Essential hypertension, benign 12/24/2016   Chest pain, rule out acute myocardial infarction 12/24/2016   ACS (acute coronary syndrome) ([place])    History of NSTEMI    Dysfunction of right eustachian tube 02/10/2016   Myelopathy ([place]) 06/09/2015   Past Medical History:  Past Medical History:  Diagnosis Date   Anticoagulated    plavix   Arthritis    Shoulder, knees, back    CAD in native artery cardiologist-  dr Angelena Form   a. CAD/NSTEMI ,  cardiac cath staged stenting-- 12-25-2016  PTCA and DES x3 to prox. and mid LAD;  12-26-2016  PCI to PLA and DES x1 to midRCA,  EF 60-65%.   Chronic lower back pain    CKD (chronic kidney disease), stage III (HCC)    Complication of anesthesia    "Too much with shoulder surgery", pt. reports that he was told the at they  "lost him, due to absorbing too much anesthesia".  shoulder surgery 1985   DDD (degenerative disc disease), lumbosacral    GERD (gastroesophageal reflux disease)    History of gastric ulcer 01/2017   History of kidney stones    History of malignant melanoma    right side of nose   History of non-ST elevation myocardial infarction (NSTEMI) 12/23/2017   s/p  staged cardiac cath,  s/p  PCI and DEStenting   Hyperlipidemia    Hypertension    IDA (iron deficiency anemia)    Iron deficiency anemia due to chronic blood loss 10/05/2021   Myocardial infarction (HCC)    x 2   Neuromuscular disorder (HCC)    neuropathy   Nocturia    RBBB    Renal calculus, right    S/P drug eluting coronary stent placement 12-25-2017,  12-26-2017   PTCA and DES x3 to prox. and mid LAD;  PCI to PLA and DES x1 to midRCA   Type 2 diabetes mellitus treated with insulin ([place])    followed by pcp   Past Surgical History:  Past Surgical History:  Procedure Laterality Date   ANAL FISSURE REPAIR  X 2   BACK SURGERY     CATARACT EXTRACTION W/ INTRAOCULAR LENS  IMPLANT, BILATERAL Bilateral 2017;  2015   COLONOSCOPY WITH PROPOFOL N/A 06/05/2016   Procedure: COLONOSCOPY WITH PROPOFOL;  Surgeon: Garlan Fair, MD;  Location: [facility];  Service: Endoscopy;  Laterality: N/A;   CONVERSION TO TOTAL KNEE Right 08/25/2020  Procedure: Revision right knee unicompartmental arthroplasty to total knee arthroplasty;  Surgeon: Gaynelle Arabian, MD;  Location: [facility];  Service: Orthopedics;  Laterality: Right;   CORONARY ANGIOGRAPHY N/A 12/26/2016   Procedure: CORONARY ANGIOGRAPHY;  Surgeon: Troy Sine, MD;  Location: [facility];  Service: Cardiovascular;  Laterality: N/A;   CORONARY STENT INTERVENTION N/A 12/25/2016   Procedure: CORONARY STENT INTERVENTION;  Surgeon: Burnell Blanks, MD;  Location: [facility];  Service: Cardiovascular;  Laterality: N/A;   CORONARY STENT INTERVENTION N/A 12/26/2016    Procedure: CORONARY STENT INTERVENTION;  Surgeon: Troy Sine, MD;  Location: [facility];  Service: Cardiovascular;  Laterality: N/A;   CYSTOSCOPY/URETEROSCOPY/HOLMIUM LASER/STENT PLACEMENT Right 01/07/2018   Procedure: RIGHT URETEROSCOPY/HOLMIUM LASER/STENT PLACEMENT;  Surgeon: Lucas Mallow, MD;  Location: [facility];  Service: Urology;  Laterality: Right;   ESOPHAGOGASTRODUODENOSCOPY (EGD) WITH PROPOFOL N/A 01/12/2017   Procedure: ESOPHAGOGASTRODUODENOSCOPY (EGD) WITH PROPOFOL;  Surgeon: Wilford Corner, MD;  Location: [facility];  Service: Endoscopy;  Laterality: N/A;   HAND TENDON SURGERY Right 10-29-2002   dr Burney Gauze @[facility]$    right index and thumb   KNEE ARTHROSCOPY Bilateral 2009-;2010   @[facility]$    LEFT HEART CATH AND CORONARY ANGIOGRAPHY N/A 12/25/2016   Procedure: LEFT HEART CATH AND CORONARY ANGIOGRAPHY;  Surgeon: Burnell Blanks, MD;  Location: [facility];  Service: Cardiovascular;  Laterality: N/A;   LEFT HEART CATH AND CORONARY ANGIOGRAPHY N/A 10/10/2019   Procedure: LEFT HEART CATH AND CORONARY ANGIOGRAPHY;  Surgeon: Troy Sine, MD;  Location: [facility];  Service: Cardiovascular;  Laterality: N/A;   LUMBAR LAMINECTOMY/DECOMPRESSION MICRODISCECTOMY N/A 06/09/2015   Procedure: Laminectomy - T12-L1;  Surgeon: Eustace Moore, MD;  Location: [facility];  Service: Neurosurgery;  Laterality: N/A;  Laminectomy - T12-L1   MEDIAL PARTIAL KNEE REPLACEMENT Bilateral 2009-2010    [facility]    MELANOMA EXCISION Right    "side of my nose"   REVERSE SHOULDER ARTHROPLASTY Right 03/31/2021   Procedure: REVERSE SHOULDER ARTHROPLASTY;  Surgeon: Justice Britain, MD;  Location: [facility];  Service: Orthopedics;  Laterality: Right;  172mn   SHOULDER SURGERY Left 1985   TOTAL SHOULDER ARTHROPLASTY Left 12/06/2012   Procedure: LEFT TOTAL SHOULDER ARTHROPLASTY VERSES A REVERSE TOTAL SHOULDER ARTHROPLASTY;  Surgeon: SAugustin Schooling MD;  Location[facility]  Service: Orthopedics;  Laterality: Left;   HPI:  Patient is an 82y.o. male with PMH: GERD, ,CAD, NSTEMI x2, chronic lower back pain, lumbosacral DDD, malignant melanoma, HLD. He was recently taking to the ER due to painful nephrolithiasis. He presented to ED for this current admission on 03/20/22 secondary to AMS. Wife reported that around 8:30am she noticed that he was not able to answer simple questions, did not seem to recognize her and had a period of speaking "jibber-jabber". In ED, CT head showed age-indeterminate lacunar infarct in the left thalamus. 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   Assessment / Plan / Recommendation Clinical Impression  As per this assessment, patient is not exhibiting any significant deficits in areas of cognition, language or speech. He was oriented to time/place/general situation. No observed oral motor weakness or assymetry and speech was clear and without observed dysarthria. Expressive and receptive language abilities appear WFL-WNL; he is HOH but was able to demonstrate hearing and understanding when SLP spoke in  a slightly louder voice. Spouse denies any current impairments and said that today patient is "125% better" from yesterday. SLP is not recommending further skilled intervention at this time.    SLP Assessment  SLP Recommendation/Assessment: Patient does not need any further Speech [place] [facility]ology Services SLP Visit Diagnosis: Cognitive communication deficit (R41.841)    Recommendations for follow up therapy are one component of a multi-disciplinary discharge planning process, led by the attending physician.  Recommendations may be updated based on patient status, additional functional criteria and insurance authorization.    Follow Up Recommendations  No SLP follow up    Assistance Recommended at  Discharge  None  Functional Status Assessment Patient has had a recent decline in their functional status and demonstrates the ability to make significant improvements in function in a reasonable and predictable amount of time.  Frequency and Duration   N/A        SLP Evaluation Cognition  Overall Cognitive Status: Within Functional Limits for tasks assessed Orientation Level: Oriented to place;Oriented to time;Oriented to person Year: 2024 Month: February Day of Week: Correct Memory: Appears intact Awareness: Appears intact Problem Solving: Appears intact Safety/Judgment: Appears intact       Comprehension  Auditory Comprehension Overall Auditory Comprehension: Appears within functional limits for tasks assessed    Expression Expression Primary Mode of Expression: Verbal Verbal Expression Overall Verbal Expression: Appears within functional limits for tasks assessed Written Expression Dominant Hand: Right   Oral / Motor  Oral Motor/Sensory Function Overall Oral Motor/Sensory Function: Within functional limits Motor Speech Overall Motor Speech: Appears within functional limits for tasks assessed Respiration: Within functional limits Resonance: Within functional limits Articulation: Within functional limitis Intelligibility: Intelligible Motor Planning: Witnin functional limits           Sonia Baller, MA, CCC-SLP Speech Therapy

## 2022-03-21 NOTE — Evaluation (Signed)
Occupational Therapy Evaluation Patient Details Name: Matthew [name] MRN: [id number] DOB: Dec 05, 1940 Today's Date: 03/21/2022   History of Present Illness Pt is an 82 y/o male who presented with dysarthria and lethargy. MRI brain showed age indeterminate lacunar infarct in L thalamus. PMH: OA, CAD, NSTEMI, chronic LBP, CKD3, GERD, malignant melanoma, HLD, HTN, anemia.   Clinical Impression   PTA, pt lives with spouse, typically Modified Independent with ADLs, driving and mobility with intermittent cane use. Pt presents now with speech much improved per spouse. Pt able to provide appropriate short responses during session though some delayed initiation noted. Overall, pt requires no more than Supervision for LB ADLs and min guard for mobility without AD. Per spouse, pt with baseline balance deficits since prior back surgery and staggering steps that were noted on return to bed normal for pt. Anticipate quick improvements with continued OOB activities and pt/family decline need for HHOT follow up at this time. Will continue to follow while admitted.       Recommendations for follow up therapy are one component of a multi-disciplinary discharge planning process, led by the attending physician.  Recommendations may be updated based on patient status, additional functional criteria and insurance authorization.   Follow Up Recommendations  No OT follow up (pt/family declined need for HHOT at this time)     Assistance Recommended at Discharge Set up Supervision/Assistance  Patient can return home with the following A little help with bathing/dressing/bathroom;Assistance with cooking/housework;Help with stairs or ramp for entrance    Functional Status Assessment  Patient has had a recent decline in their functional status and demonstrates the ability to make significant improvements in function in a reasonable and predictable amount of time.  Equipment Recommendations  None recommended by OT     Recommendations for Other Services       Precautions / Restrictions Precautions Precautions: Fall Restrictions Weight Bearing Restrictions: No      Mobility Bed Mobility Overal bed mobility: Modified Independent                  Transfers Overall transfer level: Needs assistance Equipment used: None Transfers: Sit to/from Stand Sit to Stand: Supervision                  Balance Overall balance assessment: Needs assistance Sitting-balance support: No upper extremity supported, Feet supported Sitting balance-Leahy Scale: Good     Standing balance support: No upper extremity supported, During functional activity Standing balance-Leahy Scale: Fair Standing balance comment: Fair+, able to walk to sink without AD but noted staggering when walking back to bed from sink                           ADL either performed or assessed with clinical judgement   ADL Overall ADL's : Needs assistance/impaired Eating/Feeding: Independent   Grooming: Supervision/safety;Standing;Oral care;Wash/dry face;Wash/dry hands Grooming Details (indicate cue type and reason): standing at sink without AD or LOB > 5 min Upper Body Bathing: Modified independent   Lower Body Bathing: Supervison/ safety   Upper Body Dressing : Modified independent   Lower Body Dressing: Supervision/safety   Toilet Transfer: Min guard;Ambulation   Toileting- Clothing Manipulation and Hygiene: Supervision/safety;Sit to/from stand       Functional mobility during ADLs: Min guard General ADL Comments: balance deficits at baseline with some staggering noted when leaving sink to return to bed for pending EEG. able to self correct balance with mobility in room. Educated  on use of shower chair more often and initial supervision for showering tasks once at home to decrease fall risk, as well as cane use when walking to the pond with his dog     Vision Ability to See in Adequate Light: 0  Adequate Patient Visual Report: No change from baseline Vision Assessment?: No apparent visual deficits     Perception     Praxis      Pertinent Vitals/Pain Pain Assessment Pain Assessment: No/denies pain     Hand Dominance Right   Extremity/Trunk Assessment Upper Extremity Assessment Upper Extremity Assessment: LUE deficits/detail LUE Deficits / Details: slightly weaker L shoulder though has hx of surgery; 4-/5   Lower Extremity Assessment Lower Extremity Assessment: Defer to PT evaluation   Cervical / Trunk Assessment Cervical / Trunk Assessment: Normal   Communication Communication Communication: No difficulties   Cognition Arousal/Alertness: Awake/alert Behavior During Therapy: WFL for tasks assessed/performed Overall Cognitive Status: Impaired/Different from baseline Area of Impairment: Problem solving, Awareness                           Awareness: Emergent Problem Solving: Slow processing, Decreased initiation General Comments: delayed initiation and response time though speech overall appropriate despite short responses. difficulty naming 3 animals that start with "C" (reports cat, canoe, camel); functional during tasks and improving towards baseline per wife     General Comments  Wife at bedside and supportive    Exercises     Shoulder Instructions      Home Living Family/patient expects to be discharged to:: Private residence Living Arrangements: Spouse/significant other Available Help at Discharge: Family;Available 24 hours/day Type of Home: House Home Access: Stairs to enter [employer] of Steps: 2 to enter then +1 through sunroom   Home Layout: One level     Bathroom Shower/Tub: Tub/shower unit;Walk-in shower   Bathroom Toilet: Standard     Home Equipment: Cane - single point;Shower seat          Prior Functioning/Environment Prior Level of Function : Independent/Modified Independent;Driving              Mobility Comments: intermittent cane use, baseline balance deficits since back sx per wife ADLs Comments: drives, walks to pond with his dog        OT Problem List: Impaired balance (sitting and/or standing);Decreased cognition      OT Treatment/Interventions: Self-care/ADL training;Therapeutic exercise;Energy conservation;DME and/or AE instruction;Therapeutic activities;Patient/family education    OT Goals(Current goals can be found in the care plan section) Acute Rehab OT Goals Patient Stated Goal: home soon, find out what caused the symptoms OT Goal Formulation: With patient/family Time For Goal Achievement: 04/04/22 Potential to Achieve Goals: Good  OT Frequency: Min 2X/week    Co-evaluation              AM-PAC OT "6 Clicks" Daily Activity     Outcome Measure Help from another person eating meals?: None Help from another person taking care of personal grooming?: A Little Help from another person toileting, which includes using toliet, bedpan, or urinal?: A Little Help from another person bathing (including washing, rinsing, drying)?: A Little Help from another person to put on and taking off regular upper body clothing?: None Help from another person to put on and taking off regular lower body clothing?: A Little 6 Click Score: 20   End of Session Equipment Utilized During Treatment: Gait belt Nurse Communication: Mobility status (RN present to assist with linen  change and assessment of mobility)  Activity Tolerance: Patient tolerated treatment well Patient left: in bed;with call bell/phone within reach;with bed alarm set;with family/visitor present  OT Visit Diagnosis: Unsteadiness on feet (R26.81);Other abnormalities of gait and mobility (R26.89)                Time: [id number] OT Time Calculation (min): 29 min Charges:  OT General Charges $OT Visit: 1 Visit OT Evaluation $OT Eval Low Complexity: 1 Low OT Treatments $Self Care/Home Management : 8-22  mins  Malachy Chamber, OTR/L Acute Rehab Services Office: [phone]   Layla Maw 03/21/2022, 8:45 AM

## 2022-03-21 NOTE — TOC Transition Note (Signed)
Transition of Care [facility]) - CM/SW Discharge Note   Patient Details  Name: Matthew [name] MRN: [id number] Date of Birth: 07-21-1940  [facility]) CM/SW Contact:  Levonne Lapping, RN Phone Number: 03/21/2022, 3:37 PM   Clinical Narrative:   Patient will DC to home with recommended [facility] accepted. Wife to transport home . Patient requesting a shower chair  [employer] will have delivered (no preference) either to room before dc today or to home.  This will not hold up discharge. Wife will transport patient home.   No additional TOC needs             Patient Goals and CMS Choice      Discharge Placement                         Discharge Plan and Services Additional resources added to the After Visit Summary for                                       Social Determinants of Health (SDOH) Interventions SDOH Screenings   Food Insecurity: No Food Insecurity (03/20/2022)  Housing: Low Risk  (03/20/2022)  Transportation Needs: No Transportation Needs (03/20/2022)  Utilities: Not At Risk (03/20/2022)  Tobacco Use: Low Risk  (03/20/2022)     Readmission Risk Interventions     No data to display

## 2022-03-21 NOTE — Progress Notes (Signed)
Spoke with pts wife Thayer Headings. Aware pt needs to arrive at [facility] admitting at 0830 on 03/22/2022 for scheduled surgical procedure. No food after midnight; clear liquids from midnight till 0730 then nothing by mouth. No meds in am of surgery.

## 2022-03-21 NOTE — Progress Notes (Signed)
EEG complete - results pending 

## 2022-03-21 NOTE — Consult Note (Addendum)
Stroke Neurology Consultation Note  Consult Requested by: Dr. Darrick Meigs  Reason for Consult: Transient global aphasia  Consult Date:  03/21/22  The history was obtained from the patient, spouse and chart.  During history and examination, all items were able to obtain unless otherwise noted.  History of Present Illness:  Matthew [name] is an 82 y.o. Caucasian male with PMH of CAD with non-STEMI, CKD stage III, remote history of malignant melanoma, hyperlipidemia, hypertension and diabetes who originally developed pain from a kidney stone and was headed to a urology appointment on Monday when his wife noted that he was unable to speak normally and was speaking gibberish and  nonsense.  At that time, patient was able to get himself dressed and get into the car normally and appeared to understand what his wife said but said he did not know when he was asked his name no facial droop or unilateral weakness was noted at that time.  Monday night, his speech was noted to have returned to baseline and he was no longer confused. He denied any headaches or prior h/o stroke or TIA or seizures Date last known well: Date: 03/19/2022 Time last known well: Unable to determine tPA Given: No: Outside of window and symptoms resolved MRS:  1 NIHSS:  0  Past Medical History:  Diagnosis Date   Anticoagulated    plavix   Arthritis    Shoulder, knees, back    CAD in native artery cardiologist-  dr Angelena Form   a. CAD/NSTEMI ,  cardiac cath staged stenting-- 12-25-2016  PTCA and DES x3 to prox. and mid LAD;  12-26-2016  PCI to PLA and DES x1 to midRCA,  EF 60-65%.   Chronic lower back pain    CKD (chronic kidney disease), stage III (HCC)    Complication of anesthesia    "Too much with shoulder surgery", pt. reports that he was told the at they "lost him, due to absorbing too much anesthesia".  shoulder surgery 1985   DDD (degenerative disc disease), lumbosacral    GERD (gastroesophageal reflux disease)    History  of gastric ulcer 01/2017   History of kidney stones    History of malignant melanoma    right side of nose   History of non-ST elevation myocardial infarction (NSTEMI) 12/23/2017   s/p  staged cardiac cath,  s/p  PCI and DEStenting   Hyperlipidemia    Hypertension    IDA (iron deficiency anemia)    Iron deficiency anemia due to chronic blood loss 10/05/2021   Myocardial infarction (HCC)    x 2   Neuromuscular disorder (HCC)    neuropathy   Nocturia    RBBB    Renal calculus, right    S/P drug eluting coronary stent placement 12-25-2017,  12-26-2017   PTCA and DES x3 to prox. and mid LAD;  PCI to PLA and DES x1 to midRCA   Type 2 diabetes mellitus treated with insulin ([place])    followed by pcp     Past Surgical History:  Procedure Laterality Date   ANAL FISSURE REPAIR  X 2   BACK SURGERY     CATARACT EXTRACTION W/ INTRAOCULAR LENS  IMPLANT, BILATERAL Bilateral 2017;  2015   COLONOSCOPY WITH PROPOFOL N/A 06/05/2016   Procedure: COLONOSCOPY WITH PROPOFOL;  Surgeon: Garlan Fair, MD;  Location: [facility];  Service: Endoscopy;  Laterality: N/A;   CONVERSION TO TOTAL KNEE Right 08/25/2020   Procedure: Revision right knee unicompartmental arthroplasty to total knee  arthroplasty;  Surgeon: Gaynelle Arabian, MD;  Location: [facility];  Service: Orthopedics;  Laterality: Right;   CORONARY ANGIOGRAPHY N/A 12/26/2016   Procedure: CORONARY ANGIOGRAPHY;  Surgeon: Troy Sine, MD;  Location: [place] [facility];  Service: Cardiovascular;  Laterality: N/A;   CORONARY STENT INTERVENTION N/A 12/25/2016   Procedure: CORONARY STENT INTERVENTION;  Surgeon: Burnell Blanks, MD;  Location: [place] [facility];  Service: Cardiovascular;  Laterality: N/A;   CORONARY STENT INTERVENTION N/A 12/26/2016   Procedure: CORONARY STENT INTERVENTION;  Surgeon: Troy Sine, MD;  Location: [place] [facility];  Service: Cardiovascular;  Laterality: N/A;   CYSTOSCOPY/URETEROSCOPY/HOLMIUM LASER/STENT  PLACEMENT Right 01/07/2018   Procedure: RIGHT URETEROSCOPY/HOLMIUM LASER/STENT PLACEMENT;  Surgeon: Lucas Mallow, MD;  Location: [facility];  Service: Urology;  Laterality: Right;   ESOPHAGOGASTRODUODENOSCOPY (EGD) WITH PROPOFOL N/A 01/12/2017   Procedure: ESOPHAGOGASTRODUODENOSCOPY (EGD) WITH PROPOFOL;  Surgeon: Wilford Corner, MD;  Location: [facility];  Service: Endoscopy;  Laterality: N/A;   HAND TENDON SURGERY Right 10-29-2002   dr Burney Gauze @[facility]$    right index and thumb   KNEE ARTHROSCOPY Bilateral 2009-;2010   @[facility]$    LEFT HEART CATH AND CORONARY ANGIOGRAPHY N/A 12/25/2016   Procedure: LEFT HEART CATH AND CORONARY ANGIOGRAPHY;  Surgeon: Burnell Blanks, MD;  Location: [place] [facility];  Service: Cardiovascular;  Laterality: N/A;   LEFT HEART CATH AND CORONARY ANGIOGRAPHY N/A 10/10/2019   Procedure: LEFT HEART CATH AND CORONARY ANGIOGRAPHY;  Surgeon: Troy Sine, MD;  Location: [place] [facility];  Service: Cardiovascular;  Laterality: N/A;   LUMBAR LAMINECTOMY/DECOMPRESSION MICRODISCECTOMY N/A 06/09/2015   Procedure: Laminectomy - T12-L1;  Surgeon: Eustace Moore, MD;  Location: [facility];  Service: Neurosurgery;  Laterality: N/A;  Laminectomy - T12-L1   MEDIAL PARTIAL KNEE REPLACEMENT Bilateral 2009-2010    [facility]    MELANOMA EXCISION Right    "side of my nose"   REVERSE SHOULDER ARTHROPLASTY Right 03/31/2021   Procedure: REVERSE SHOULDER ARTHROPLASTY;  Surgeon: Justice Britain, MD;  Location: [facility];  Service: Orthopedics;  Laterality: Right;  144mn   SHOULDER SURGERY Left 1985   TOTAL SHOULDER ARTHROPLASTY Left 12/06/2012   Procedure: LEFT TOTAL SHOULDER ARTHROPLASTY VERSES A REVERSE TOTAL SHOULDER ARTHROPLASTY;  Surgeon: SAugustin Schooling MD;  [place] [facility]  Service: Orthopedics;  Laterality: Left;    Family History  Problem Relation Age of Onset   CAD Father    Heart failure Father    CAD Sister    Stroke Mother    Heart failure  Mother      Social History:  reports that he has never smoked. He has never used smokeless tobacco. He reports that he does not drink alcohol and does not use drugs.  Review of Systems: A full ROS was attempted today and was able to be performed.  Systems assessed include - Constitutional, Eyes, HENT, Respiratory, Cardiovascular, Gastrointestinal, Genitourinary, Integument/breast, Hematologic/lymphatic, Musculoskeletal, Neurological, Behavioral/Psych, Endocrine, Allergic/Immunologic - with pertinent responses as per HPI.  Allergies:  Allergies  Allergen Reactions   Statins Other (See Comments)    Severe stomach pain.   Oxycodone Itching   Hydrocodone Other (See Comments)    "messes with my hearing"   Codeine Itching   Morphine And Related Itching     Medications: Prior to Admission:  Medications Prior to Admission  Medication Sig Dispense Refill Last Dose   acetaminophen (TYLENOL) 500 MG tablet Take 1,000 mg by mouth every 6 (six) hours as needed  for mild pain.   03/19/2022   amoxicillin (AMOXIL) 500 MG capsule Take 2,000 mg by mouth See admin instructions. Take 2000 mg by mouth 1 hour prior to dental work   unk   aspirin EC 81 MG tablet Take 1 tablet (81 mg total) by mouth daily. Swallow whole. 90 tablet 3 03/18/2022   cyclobenzaprine (FLEXERIL) 10 MG tablet Take 1 tablet (10 mg total) by mouth 3 (three) times daily as needed for muscle spasms. 30 tablet 1 unk   empagliflozin (JARDIANCE) 25 MG TABS tablet Take 12.5 mg by mouth daily.   03/17/2022   ezetimibe (ZETIA) 10 MG tablet Take 1 tablet (10 mg total) by mouth daily. 90 tablet 3 03/17/2022   ferrous gluconate (FERGON) 324 MG tablet Take 324 mg by mouth 3 (three) times a week. Mon, Wed, friday   03/17/2022   insulin aspart protamine- aspart (NOVOLOG MIX 70/30) (70-30) 100 UNIT/ML injection Inject 10 Units into the skin 2 (two) times daily with a meal.   unk   isosorbide mononitrate (IMDUR) 60 MG 24 hr tablet Take 60 mg by mouth daily.    03/17/2022   losartan (COZAAR) 50 MG tablet Take 1 tablet (50 mg total) by mouth daily. (Patient taking differently: Take 50 mg by mouth at bedtime.) 90 tablet 1 03/17/2022   metFORMIN (GLUCOPHAGE XR) 500 MG 24 hr tablet Take 1 tablet (500 mg total) by mouth daily with breakfast. 90 tablet 3 03/17/2022   nitroGLYCERIN (NITROSTAT) 0.4 MG SL tablet Place 1 tablet (0.4 mg total) under the tongue every 5 (five) minutes as needed for chest pain. 30 tablet 12 03/18/2022   Omega-3 Fatty Acids (FISH OIL) 1000 MG CAPS Take 1,000 mg by mouth daily.   Past Month   ondansetron (ZOFRAN) 4 MG tablet Take 1 tablet (4 mg total) by mouth every 8 (eight) hours as needed for nausea or vomiting. 10 tablet 0 unk   pantoprazole (PROTONIX) 40 MG tablet Take 40 mg by mouth daily.   03/17/2022   rosuvastatin (CRESTOR) 10 MG tablet Take 5 mg by mouth at bedtime.   03/17/2022   tamsulosin (FLOMAX) 0.4 MG CAPS capsule Take 0.4 mg by mouth daily after supper.   03/17/2022   Vitamin D, Cholecalciferol, 25 MCG (1000 UT) CAPS Take 1,000 Units by mouth daily.    03/17/2022   Zinc 50 MG CAPS Take 50 mg by mouth daily.   03/17/2022   Continuous Blood Gluc Receiver (FREESTYLE LIBRE 2 READER) DEVI As directed 1 each 0    Continuous Blood Gluc Sensor (FREESTYLE LIBRE 2 SENSOR) MISC 1 Piece by Does not apply route every 14 (fourteen) days. 2 each 3    metoprolol tartrate (LOPRESSOR) 25 MG tablet TAKE 1 TABLET BY MOUTH TWICE A DAY (Patient not taking: Reported on 03/20/2022) 180 tablet 1 Not Taking   Scheduled:  aspirin EC  81 mg Oral Daily   clopidogrel  75 mg Oral Daily   empagliflozin  10 mg Oral Daily   ezetimibe  10 mg Oral Daily   insulin aspart  0-15 Units Subcutaneous TID WC   insulin aspart protamine- aspart  10 Units Subcutaneous BID WC   [START ON 03/22/2022] metFORMIN  500 mg Oral Q breakfast   pantoprazole  40 mg Oral Daily   rosuvastatin  5 mg Oral QHS   tamsulosin  0.4 mg Oral QPC supper   Continuous: [id number] **OR**  acetaminophen, ondansetron **OR** ondansetron (ZOFRAN) IV  Test Results: CBC:  Recent Labs  Lab 03/20/22 1014 03/20/22 1509 03/21/22 0310  WBC 8.1  --  5.5  NEUTROABS 6.1  --   --   HGB 15.3 11.9* 13.1  HCT 45.2 35.0* 38.5*  MCV 89.0  --  89.3  PLT 139*  --  0000000*   Basic Metabolic Panel:  Recent Labs  Lab 03/20/22 1014 03/20/22 1509 03/21/22 0310  NA 132* 138 135  K 3.8 3.4* 3.8  CL 96* 101 99  CO2 27  --  23  GLUCOSE 180* 137* 139*  BUN 23 19 21  $ CREATININE 2.01* 1.60* 2.27*  CALCIUM 8.9  --  8.5*   Liver Function Tests: Recent Labs  Lab 03/20/22 1014 03/21/22 0310  AST 16 12*  ALT 19 12  ALKPHOS 83 65  BILITOT 1.4* 1.1  PROT 7.4 5.6*  ALBUMIN 4.0 3.0*   No results for input(s): "LIPASE", "AMYLASE" in the last 168 hours. No results for input(s): "AMMONIA" in the last 168 hours. Coagulation Studies:  Recent Labs    03/20/22 1014  LABPROT 13.1  INR 1.0   Cardiac Enzymes: No results for input(s): "CKTOTAL", "CKMB", "CKMBINDEX", "TROPONINI" in the last 168 hours. BNP: Invalid input(s): "POCBNP" CBG:  Recent Labs  Lab 03/20/22 1012 03/21/22 0844  GLUCAP 166* 146*   Urinalysis:  Recent Labs  Lab 03/20/22 [place]  LABSPEC 1.035*  PHURINE 5.0  GLUCOSEU NEGATIVE  HGBUR NEGATIVE  BILIRUBINUR NEGATIVE  KETONESUR NEGATIVE  PROTEINUR NEGATIVE  NITRITE NEGATIVE  LEUKOCYTESUR NEGATIVE   Microbiology:  Results for orders placed or performed during the hospital encounter of 03/21/21  Surgical PCR Screen     Status: None   Collection Time: 03/21/21 11:04 AM   Specimen: Nasal Mucosa; Nasal Swab  Result Value Ref Range Status   MRSA, PCR NEGATIVE NEGATIVE Final   Staphylococcus aureus NEGATIVE NEGATIVE Final    Comment: (NOTE) The Xpert SA Assay (FDA approved for NASAL specimens in patients 64 years of age and older), is one component of a comprehensive surveillance program. It is not intended to diagnose infection nor to guide or  monitor treatment. Performed at [facility], [place] [address], [place], [place] [address]    Lipid Panel:     Component Value Date/Time   CHOL 132 03/21/2022 0310   CHOL 182 11/11/2020 0855   TRIG 142 03/21/2022 0310   HDL 34 (L) 03/21/2022 0310   HDL 40 11/11/2020 0855   CHOLHDL 3.9 03/21/2022 0310   VLDL 28 03/21/2022 0310   LDLCALC 70 03/21/2022 0310   LDLCALC 117 (H) 11/11/2020 0855   HgbA1c:  Lab Results  Component Value Date   HGBA1C 7.6 (H) 03/20/2022   Urine Drug Screen:     Component Value Date/Time   LABOPIA POSITIVE (A) 03/20/2022 1427   COCAINSCRNUR NONE DETECTED 03/20/2022 1427   LABBENZ NONE DETECTED 03/20/2022 1427   AMPHETMU NONE DETECTED 03/20/2022 1427   THCU NONE DETECTED 03/20/2022 1427   LABBARB NONE DETECTED 03/20/2022 1427    Alcohol Level:  Recent Labs  Lab 03/20/22 1014  ETH <10    EEG adult  Result Date: 03/21/2022 Lora Havens, MD     03/21/2022 11:05 AM Patient Name: Matthew [name] MRN: [id number] Epilepsy Attending: Lora Havens Referring Physician/Provider: Garvin Fila, MD Date: 03/21/2022 Duration: 23.38 mins Patient history: 82 yo man with hx CAD s/p NSTEMI, PCI and DES 2019 on plavix, CKD stage III, hx malignant melanoma, HL, HTN, DM2 BIB EMS for global  aphasia.  EEG to evaluate for seizure. Level of alertness: Awake AEDs during EEG study: None Technical aspects: This EEG study was done with scalp electrodes positioned according to the 10-20 International system of electrode placement. Electrical activity was reviewed with band pass filter of 1-70Hz$ , sensitivity of 7 uV/mm, display speed of 40m/sec with a 60Hz$  notched filter applied as appropriate. EEG data were recorded continuously and digitally stored.  Video monitoring was available and reviewed as appropriate. Description: The posterior dominant rhythm consists of 8 Hz activity of moderate voltage (25-35 uV) seen predominantly in posterior head regions,  symmetric and reactive to eye opening and eye closing. EEG showed intermittent generalized and lateralized left hemisphere 3 to 6 Hz theta-delta slowing.  Photic driving was not seen during photic stimulation.  Hyperventilation was not performed.   ABNORMALITY - Intermittent slow, generalized and lateralized left hemisphere IMPRESSION: This study is suggestive of non-specific cortical dysfunction arising from left hemisphere. Additionally there is mild diffuse encephalopathy, nonspecific etiology. No seizures or epileptiform discharges were seen throughout the recording. PLora Havens  MR BRAIN WO CONTRAST  Result Date: 03/20/2022 CLINICAL DATA:  Provided history: Neuro deficit, acute, stroke suspected. EXAM: MRI HEAD WITHOUT CONTRAST TECHNIQUE: Multiplanar, multiecho pulse sequences of the brain and surrounding structures were obtained without intravenous contrast. COMPARISON:  Non-contrast head CT and CT angiogram head/neck performed earlier today 03/20/2022. FINDINGS: Intermittently motion degraded examination. Most notably, the axial susceptibility-weighted and coronal T2-weighted sequences are moderately motion degraded. Brain: Mild generalized cerebral atrophy. Multifocal T2 FLAIR hyperintense signal abnormality within the cerebral white matter, nonspecific but compatible with mild chronic small vessel ischemic disease. 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 Small chronic infarct within the right cerebellar hemisphere. There is no acute infarct. No evidence of an intracranial mass. No chronic intracranial blood products. No extra-axial fluid collection. No midline shift. Vascular: Maintained flow voids within the proximal large arterial vessels. Skull and upper cervical spine: No focal suspicious marrow lesion. Incompletely assessed cervical spondylosis. At C3-C4, a central disc protrusion contributes to at least moderate spinal canal stenosis, contacting and flattening the ventral aspect of  spinal cord (series 7, image 12). Sinuses/Orbits: No mass or acute finding within the imaged orbits. Prior bilateral ocular lens replacement. Mild mucosal thickening within bilateral ethmoid air cells. Other: Trace fluid within the bilateral mastoid air cells. IMPRESSION: 1. Intermittently motion degraded examination. 2. No evidence of an acute intracranial abnormality. Specifically, the diffusion-weighted imaging is of good quality and there is no evidence of an acute infarct. 3. Mild chronic small vessel ischemic changes within the cerebral white matter. 4. 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 5. Small chronic infarct within the right cerebellar hemisphere. 6. Mild generalized cerebral atrophy. 7. Incompletely assessed cervical spondylosis. At C3-C4, a central disc protrusion contributes to at least moderate spinal canal stenosis, contacting and flattening the ventral aspect of the spinal cord. A non-emergent MRI of the cervical spine should be considered for further evaluation. 8. Mild mucosal thickening within the bilateral ethmoid sinuses. Electronically Signed   By: KKellie SimmeringD.O.   On: 03/20/2022 14:01   CT ANGIO HEAD NECK W WO CM W PERF (CODE STROKE)  Result Date: 03/20/2022 CLINICAL DATA:  Neuro deficit, acute, stroke suspected. Aphasia. Unable to straighten right arm. EXAM: CT ANGIOGRAPHY HEAD AND NECK CT PERFUSION BRAIN TECHNIQUE: Multidetector CT imaging of the head and neck was performed using the standard protocol during bolus administration of intravenous contrast. Multiplanar CT image reconstructions and MIPs  were obtained to evaluate the vascular anatomy. Carotid stenosis measurements (when applicable) are obtained utilizing NASCET criteria, using the distal internal carotid diameter as the denominator. Multiphase CT imaging of the brain was performed following IV bolus contrast injection. Subsequent parametric perfusion maps were calculated using RAPID software. RADIATION DOSE  REDUCTION: This exam was performed according to the departmental dose-optimization program which includes automated exposure control, adjustment of the mA and/or kV according to patient size and/or use of iterative reconstruction technique. CONTRAST:  177m OMNIPAQUE IOHEXOL 350 MG/ML SOLN COMPARISON:  Same-day CT head. FINDINGS: CTA NECK FINDINGS Aortic arch: Three-vessel arch configuration. Atherosclerotic calcifications of the aortic arch and arch vessel origins. Mild stenosis of the left subclavian artery origin. Right carotid system: Mild calcified plaque along the right common carotid. Predominantly calcified plaque at the carotid bulb and proximal right ICA without hemodynamically significant stenosis. Left carotid system: Unremarkable common carotid. Densely calcified plaque at the left carotid bulb and proximal left ICA without hemodynamically significant stenosis. Vertebral arteries: Calcified plaque results in mild stenosis of the right vertebral artery origin. Scattered calcified plaque along the V2 segments bilaterally. Densely calcified, circumferential plaque of the right V4 segment results in at least moderate stenosis. Mixed plaque along the left V4 segment results in mild stenosis. Skeleton: Multilevel cervical spondylosis, worst at C5-6, where there is at least moderate spinal canal stenosis. No suspicious bone lesions. Other neck: Streak artifact from bilateral reverse shoulder arthroplasties. Otherwise unremarkable. Upper chest: Unremarkable. Review of the MIP images confirms the above findings CTA HEAD FINDINGS Anterior circulation: Calcified plaque along the carotid siphons without hemodynamically significant stenosis. Mild stenosis of the left MCA origin. Vessel is otherwise patent proximally. The right MCA and bilateral ACAs are patent proximally. Distal branches are symmetric. Posterior circulation: Normal basilar artery. The SCAs, AICAs and PICAs are patent proximally. The PCAs are patent  proximally without stenosis or aneurysm. Distal branches are symmetric. Venous sinuses: As permitted by contrast timing, patent. Anatomic variants: Hypoplastic left A1 segment. Hypoplastic left P1 segment with persistent fetal origin of the left PCA. Review of the MIP images confirms the above findings CT Brain Perfusion Findings: ASPECTS: 10 CBF (<30%) Volume: 017mPerfusion (Tmax>6.0s) volume: 5321mThis is favored to be artifactual due to patient motion artifact in the 20-40 second window. Mismatch Volume: 47m57mgain favored to represent artifact. Infarction Location: Not applicable. IMPRESSION: 1. No large vessel occlusion or hemodynamically significant stenosis in the neck. 2. Perfusion imaging results are favored to be artifactual due to patient motion. 3. Moderate stenosis of the right vertebral artery V4 segment. Mild stenosis of the left MCA M1 segment. 4. Aortic Atherosclerosis (ICD10-I70.0). Code stroke imaging results were communicated on 03/20/2022 at 11:12 am to provider Dr. StacQuinn Axe telephone, who verbally acknowledged these results. Electronically Signed   By: WaltEmmit Alexanders.   On: 03/20/2022 11:26   CT HEAD CODE STROKE WO CONTRAST  Result Date: 03/20/2022 CLINICAL DATA:  Code stroke. Neuro deficit, acute, stroke suspected. Aphasia. Last seen normal last night. Unable to straighten right arm. EXAM: CT HEAD WITHOUT CONTRAST TECHNIQUE: Contiguous axial images were obtained from the base of the skull through the vertex without intravenous contrast. RADIATION DOSE REDUCTION: This exam was performed according to the departmental dose-optimization program which includes automated exposure control, adjustment of the mA and/or kV according to patient size and/or use of iterative reconstruction technique. COMPARISON:  Head CT 05/13/2019. FINDINGS: Brain: Age-indeterminate lacunar infarct in the left thalamus. No acute intracranial hemorrhage. Gray-white differentiation  is otherwise preserved. No  hydrocephalus or extra-axial collection. Vascular: No hyperdense vessel. Dense atherosclerotic calcification of the left carotid terminus with extension into the proximal left MCA M1 segment. Skull: No calvarial fracture or suspicious bone lesion. Skull base is unremarkable. Sinuses/Orbits: Unremarkable. Other: None. ASPECTS [facility] Stroke Program Early CT Score) - Ganglionic level infarction (caudate, lentiform nuclei, internal capsule, insula, M1-M3 cortex): 7 - Supraganglionic infarction (M4-M6 cortex): 3 Total score (0-10 with 10 being normal): 10 IMPRESSION: 1. No acute intracranial hemorrhage or evidence of large vessel territory infarction. ASPECT score is 10. 2. Age-indeterminate lacunar infarct in the left thalamus. 3. Dense atherosclerotic calcification of the left carotid terminus with extension into the proximal left MCA M1 segment. No hyperdense vessel. Code stroke imaging results were communicated on 03/20/2022 at 10:53 am to provider Dr. Pearline Cables via telephone, who verbally acknowledged these results. Electronically Signed   By: Emmit Alexanders M.D.   On: 03/20/2022 10:54     EKG: normal sinus rhythm, RBBB.   Physical Examination: Temp:  [97.9 F (36.6 C)-98.7 F (37.1 C)] 97.9 F (36.6 C) (02/13 0800) Pulse Rate:  [48-83] 54 (02/13 0800) Resp:  [10-25] 17 (02/13 0800) BP: (135-189)/(55-113) 166/58 (02/13 0800) SpO2:  [94 %-99 %] 95 % (02/13 0800) Weight:  [78.2 kg] 78.2 kg (02/12 1824)  General - Well nourished, well developed, in no apparent distress.   Mental Status -  Level of arousal and orientation to time, place, and person were intact.  Disoriented to place, time and person Language including expression, naming, repetition, comprehension was assessed and found intact. Attention span and concentration were normal. Recent and remote memory were intact. Fund of Knowledge was assessed and was intact.  Cranial Nerves II - XII - II - Visual field intact OU. III, IV, VI -  Extraocular movements intact. V - Facial sensation intact bilaterally. VII - Facial movement intact bilaterally. VIII - Hearing & vestibular intact bilaterally. XII - Tongue protrusion intact.  Motor Strength - The patient's strength was normal in all extremities and pronator drift was absent.  Bulk was normal and fasciculations were absent.   Motor Tone - Muscle tone was assessed at the neck and appendages and was normal.  Sensory - Light touch was assessed and was symmetrical.    Coordination - The patient had normal movements in the hands and feet with no ataxia or dysmetria.  Tremor was absent.  Gait and Station - deferred.   Assessment:  Matthew [name] is a 82 y.o. male with history of CAD with non-STEMI, CKD stage III, melanoma, hypertension, hyperlipidemia and diabetes presenting with acute onset aphasia and confusion which resolved. He did not receive IV TNK due to being outside the window and symptoms having resolved.  Patient has been having symptoms of abdominal pain due to kidney stone for several days and was on his way to a urology appointment to discuss lithotripsy when symptoms occurred.  He has been noted to have an increase in creatinine over the last day, likely due to extra contrast given during CTA.  Left hemispheric TIA versus acute toxic metabolic encephalopathy in setting of kidney stone, dehydration and renal failure Code Stroke CT head No acute stroke. ASPECTS 10.    CTA head & neck no LVO or hemodynamically significant stenosis, moderate stenosis of right V4 segment and mild stenosis of left M1 segment CT perfusion 53 mm penumbra, favored to represent artifact given patient motion MRI no acute infarct, mild chronic small vessel ischemic changes,  mild atrophy, chronic lacunar infarct in left thalamus, chronic infarct in right cerebellar hemisphere 2D Echo pending LDL 70 HgbA1c 7.6  SCDs for VTE prophylaxis Diet Order             Diet heart healthy/carb  modified Room service appropriate? Yes; Fluid consistency: Thin  Diet effective now                   aspirin 81 mg daily prior to admission, now on aspirin 81 mg daily and clopidogrel 75 mg daily for 3 weeks followed by Plavix alone Therapy recommendations: No OT follow-up, PT pending Disposition: Pending  Hypertension Stable Keep systolic blood pressure less than 180 Long-term BP goal normotensive  Hyperlipidemia Home meds: None LDL 70, goal < 70 Rosuvastatin 5 mg daily, no need for high intensity statin as LDL close to goal Continue statin at discharge  Diabetes type II, Uncontrolled HgbA1c 7.6, goal < 7.0 CBGs SSI  Other Stroke Risk Factors Advanced age Hx stroke Coronary artery disease  Other Active Problems Kidney stone, to be managed by urology and primary team  Hospital day # 1   Thank you for this consultation and allowing [place] to participate in the care of this patient.  [place] , MSN, AGACNP-BC [facility] See Amion for schedule and pager information 03/21/2022 12:32 PM   STROKE MD NOTE :  I have personally obtained history,examined this patient, reviewed notes, independently viewed imaging studies, participated in medical decision making and plan of care.ROS completed by me personally and pertinent positives fully documented  I have made any additions or clarifications directly to the above note. Agree with note above.  Patient presented with sudden onset of confusion and speech difficulties in the setting of kidney stones, dehydration and using narcotics with worsening renal function on presentation.  His speech difficulties have resolved but he remains disoriented and MRI is negative for acute stroke.  Recommend IV hydration and correct renal dysfunction.  Check EEG for seizures and ongoing stroke workup.  Long discussion with patient and wife and answered questions.  Discussed with Dr. Darrick Meigs.  Greater than 50% time during this  50-minute visit were spent on counseling and coordination of care about his speech difficulties and confusion and discussion about differential diagnosis and evaluation and treatment plan and answering questions.  Antony Contras, MD Medical Director [facility] Pager: [phone] 03/21/2022 12:52 PM   To contact Stroke Continuity provider, please refer to [email or web address] After hours, contact General Neurology

## 2022-03-21 NOTE — Progress Notes (Signed)
Went over discharge papers with patient and wife.  All questions answered.

## 2022-03-21 NOTE — Care Management CC44 (Signed)
Condition Code 44 Documentation Completed  Patient Details  Name: BELA SOKOLOFF MRN: [id number] Date of Birth: Jun 29, 1940   Condition Code 44 given:  Yes Patient signature on Condition Code 44 notice:  Yes Documentation of 2 MD's agreement:  Yes Code 44 added to claim:  Yes    Levonne Lapping, RN 03/21/2022, 4:23 PM

## 2022-03-22 ENCOUNTER — Encounter (HOSPITAL_COMMUNITY): Payer: Self-pay | Admitting: Urology

## 2022-03-22 ENCOUNTER — Encounter: Payer: Self-pay | Admitting: Hematology

## 2022-03-22 ENCOUNTER — Other Ambulatory Visit (HOSPITAL_COMMUNITY): Payer: Self-pay

## 2022-03-22 ENCOUNTER — Ambulatory Visit (HOSPITAL_COMMUNITY): Payer: Medicare Other

## 2022-03-22 ENCOUNTER — Encounter (HOSPITAL_COMMUNITY)
Admission: RE | Disposition: A | Discharge status: Home / Self Care | Payer: Self-pay | Source: Ambulatory Visit | Attending: Urology

## 2022-03-22 ENCOUNTER — Ambulatory Visit (HOSPITAL_COMMUNITY): Payer: Medicare Other | Admitting: Anesthesiology

## 2022-03-22 ENCOUNTER — Ambulatory Visit (HOSPITAL_COMMUNITY)
Admission: RE | Admit: 2022-03-22 | Discharge: 2022-03-22 | Disposition: A | Discharge status: Home / Self Care | Payer: Medicare Other | Source: Ambulatory Visit | Attending: Urology | Admitting: Urology

## 2022-03-22 ENCOUNTER — Ambulatory Visit (HOSPITAL_BASED_OUTPATIENT_CLINIC_OR_DEPARTMENT_OTHER): Payer: Medicare Other | Admitting: Anesthesiology

## 2022-03-22 DIAGNOSIS — Z9889 Other specified postprocedural states: Secondary | ICD-10-CM | POA: Diagnosis not present

## 2022-03-22 DIAGNOSIS — I252 Old myocardial infarction: Secondary | ICD-10-CM | POA: Insufficient documentation

## 2022-03-22 DIAGNOSIS — M1990 Unspecified osteoarthritis, unspecified site: Secondary | ICD-10-CM | POA: Diagnosis not present

## 2022-03-22 DIAGNOSIS — Z7984 Long term (current) use of oral hypoglycemic drugs: Secondary | ICD-10-CM | POA: Insufficient documentation

## 2022-03-22 DIAGNOSIS — E1122 Type 2 diabetes mellitus with diabetic chronic kidney disease: Secondary | ICD-10-CM | POA: Insufficient documentation

## 2022-03-22 DIAGNOSIS — D631 Anemia in chronic kidney disease: Secondary | ICD-10-CM | POA: Diagnosis not present

## 2022-03-22 DIAGNOSIS — N201 Calculus of ureter: Secondary | ICD-10-CM | POA: Insufficient documentation

## 2022-03-22 DIAGNOSIS — I2510 Atherosclerotic heart disease of native coronary artery without angina pectoris: Secondary | ICD-10-CM | POA: Diagnosis not present

## 2022-03-22 DIAGNOSIS — I129 Hypertensive chronic kidney disease with stage 1 through stage 4 chronic kidney disease, or unspecified chronic kidney disease: Secondary | ICD-10-CM

## 2022-03-22 DIAGNOSIS — I739 Peripheral vascular disease, unspecified: Secondary | ICD-10-CM | POA: Diagnosis not present

## 2022-03-22 DIAGNOSIS — Z955 Presence of coronary angioplasty implant and graft: Secondary | ICD-10-CM | POA: Insufficient documentation

## 2022-03-22 DIAGNOSIS — N179 Acute kidney failure, unspecified: Secondary | ICD-10-CM | POA: Diagnosis not present

## 2022-03-22 DIAGNOSIS — N183 Chronic kidney disease, stage 3 unspecified: Secondary | ICD-10-CM | POA: Diagnosis not present

## 2022-03-22 DIAGNOSIS — N189 Chronic kidney disease, unspecified: Secondary | ICD-10-CM

## 2022-03-22 DIAGNOSIS — K219 Gastro-esophageal reflux disease without esophagitis: Secondary | ICD-10-CM | POA: Insufficient documentation

## 2022-03-22 DIAGNOSIS — Z79899 Other long term (current) drug therapy: Secondary | ICD-10-CM | POA: Insufficient documentation

## 2022-03-22 DIAGNOSIS — Z794 Long term (current) use of insulin: Secondary | ICD-10-CM | POA: Insufficient documentation

## 2022-03-22 DIAGNOSIS — N132 Hydronephrosis with renal and ureteral calculous obstruction: Secondary | ICD-10-CM | POA: Diagnosis not present

## 2022-03-22 DIAGNOSIS — Z87891 Personal history of nicotine dependence: Secondary | ICD-10-CM | POA: Insufficient documentation

## 2022-03-22 DIAGNOSIS — N1330 Unspecified hydronephrosis: Secondary | ICD-10-CM | POA: Diagnosis not present

## 2022-03-22 HISTORY — PX: CYSTOSCOPY/URETEROSCOPY/HOLMIUM LASER/STENT PLACEMENT: SHX6546

## 2022-03-22 LAB — GLUCOSE, CAPILLARY
Glucose-Capillary: 155 mg/dL — ABNORMAL HIGH (ref 70–99)
Glucose-Capillary: 164 mg/dL — ABNORMAL HIGH (ref 70–99)

## 2022-03-22 LAB — BASIC METABOLIC PANEL
Anion gap: 12 (ref 5–15)
BUN: 27 mg/dL — ABNORMAL HIGH (ref 8–23)
CO2: 27 mmol/L (ref 22–32)
Calcium: 8.4 mg/dL — ABNORMAL LOW (ref 8.9–10.3)
Chloride: 100 mmol/L (ref 98–111)
Creatinine, Ser: 2.03 mg/dL — ABNORMAL HIGH (ref 0.61–1.24)
GFR, Estimated: 33 mL/min — ABNORMAL LOW (ref >60)
Glucose, Bld: 167 mg/dL — ABNORMAL HIGH (ref 70–99)
Potassium: 3.8 mmol/L (ref 3.5–5.1)
Sodium: 139 mmol/L (ref 135–145)

## 2022-03-22 SURGERY — CYSTOSCOPY/URETEROSCOPY/HOLMIUM LASER/STENT PLACEMENT
Anesthesia: General | Laterality: Left

## 2022-03-22 MED ORDER — LACTATED RINGERS IV SOLN: INTRAVENOUS | Status: DC | PRN

## 2022-03-22 MED ORDER — DEXAMETHASONE SODIUM PHOSPHATE 10 MG/ML IJ SOLN
INTRAMUSCULAR | Status: DC | PRN
  Administered 2022-03-22: 4 mg via INTRAVENOUS

## 2022-03-22 MED ORDER — LIDOCAINE HCL (PF) 2 % IJ SOLN
INTRAMUSCULAR | Status: AC
  Filled 2022-03-22: qty 5

## 2022-03-22 MED ORDER — PROPOFOL 10 MG/ML IV BOLUS
INTRAVENOUS | Status: AC
  Filled 2022-03-22: qty 20

## 2022-03-22 MED ORDER — IOHEXOL 300 MG/ML  SOLN
INTRAMUSCULAR | Status: DC | PRN
  Administered 2022-03-22: 9 mL

## 2022-03-22 MED ORDER — FENTANYL CITRATE (PF) 100 MCG/2ML IJ SOLN
INTRAMUSCULAR | Status: DC | PRN
  Administered 2022-03-22: 50 ug via INTRAVENOUS

## 2022-03-22 MED ORDER — TRAMADOL HCL 50 MG PO TABS
50.0000 mg | ORAL_TABLET | Freq: Four times a day (QID) | ORAL | 1 refills | Status: AC | PRN
  Filled 2022-03-22: qty 10, 3d supply, fill #0

## 2022-03-22 MED ORDER — ONDANSETRON HCL 4 MG/2ML IJ SOLN
INTRAMUSCULAR | Status: DC | PRN
  Administered 2022-03-22: 4 mg via INTRAVENOUS

## 2022-03-22 MED ORDER — DEXAMETHASONE SODIUM PHOSPHATE 10 MG/ML IJ SOLN
INTRAMUSCULAR | Status: AC
  Filled 2022-03-22: qty 1

## 2022-03-22 MED ORDER — LIDOCAINE 2% (20 MG/ML) 5 ML SYRINGE
INTRAMUSCULAR | Status: DC | PRN
  Administered 2022-03-22: 100 mg via INTRAVENOUS

## 2022-03-22 MED ORDER — EPHEDRINE 5 MG/ML INJ
INTRAVENOUS | Status: AC
  Filled 2022-03-22: qty 5

## 2022-03-22 MED ORDER — 0.9 % SODIUM CHLORIDE (POUR BTL) OPTIME
TOPICAL | Status: DC | PRN
  Administered 2022-03-22: 1000 mL

## 2022-03-22 MED ORDER — EPHEDRINE SULFATE-NACL 50-0.9 MG/10ML-% IV SOSY
PREFILLED_SYRINGE | INTRAVENOUS | Status: DC | PRN
  Administered 2022-03-22: 10 mg via INTRAVENOUS
  Administered 2022-03-22: 5 mg via INTRAVENOUS

## 2022-03-22 MED ORDER — FENTANYL CITRATE (PF) 100 MCG/2ML IJ SOLN
INTRAMUSCULAR | Status: AC
  Filled 2022-03-22: qty 2

## 2022-03-22 MED ORDER — HYDRALAZINE HCL 20 MG/ML IJ SOLN
INTRAMUSCULAR | Status: AC
  Filled 2022-03-22: qty 1

## 2022-03-22 MED ORDER — HYDRALAZINE HCL 20 MG/ML IJ SOLN
10.0000 mg | Freq: Once | INTRAMUSCULAR | Status: AC
  Administered 2022-03-22: 10 mg via INTRAVENOUS

## 2022-03-22 MED ORDER — PHENYLEPHRINE 80 MCG/ML (10ML) SYRINGE FOR IV PUSH (FOR BLOOD PRESSURE SUPPORT)
PREFILLED_SYRINGE | INTRAVENOUS | Status: AC
  Filled 2022-03-22: qty 10

## 2022-03-22 MED ORDER — PROPOFOL 10 MG/ML IV BOLUS
INTRAVENOUS | Status: DC | PRN
  Administered 2022-03-22: 50 mg via INTRAVENOUS
  Administered 2022-03-22: 30 mg via INTRAVENOUS
  Administered 2022-03-22: 110 mg via INTRAVENOUS

## 2022-03-22 MED ORDER — SODIUM CHLORIDE 0.9 % IR SOLN
Status: DC | PRN
  Administered 2022-03-22: 3000 mL via INTRAVESICAL

## 2022-03-22 MED ORDER — ACETAMINOPHEN 500 MG PO TABS
1000.0000 mg | ORAL_TABLET | Freq: Once | ORAL | Status: AC
  Administered 2022-03-22: 1000 mg via ORAL
  Filled 2022-03-22: qty 2

## 2022-03-22 MED ORDER — CEFAZOLIN SODIUM-DEXTROSE 2-4 GM/100ML-% IV SOLN
2.0000 g | INTRAVENOUS | Status: AC
  Administered 2022-03-22: 2 g via INTRAVENOUS
  Filled 2022-03-22: qty 100

## 2022-03-22 MED ORDER — ONDANSETRON HCL 4 MG/2ML IJ SOLN
INTRAMUSCULAR | Status: AC
  Filled 2022-03-22: qty 2

## 2022-03-22 SURGICAL SUPPLY — 23 items
BAG URO CATCHER STRL LF (MISCELLANEOUS) ×1 IMPLANT
BASKET LASER NITINOL 1.9FR (BASKET) IMPLANT
BASKET ZERO TIP NITINOL 2.4FR (BASKET) IMPLANT
CATH URETERAL DUAL LUMEN 10F (MISCELLANEOUS) IMPLANT
CATH URETL OPEN END 6FR 70 (CATHETERS) ×1 IMPLANT
CLOTH BEACON ORANGE TIMEOUT ST (SAFETY) ×1 IMPLANT
EXTRACTOR STONE 1.7FRX115CM (UROLOGICAL SUPPLIES) IMPLANT
GLOVE BIO SURGEON STRL SZ7.5 (GLOVE) ×1 IMPLANT
GOWN STRL REUS W/ TWL XL LVL3 (GOWN DISPOSABLE) ×1 IMPLANT
GOWN STRL REUS W/TWL XL LVL3 (GOWN DISPOSABLE) ×1
GUIDEWIRE ANG ZIPWIRE 038X150 (WIRE) IMPLANT
GUIDEWIRE STR DUAL SENSOR (WIRE) ×1 IMPLANT
KIT TURNOVER KIT A (KITS) IMPLANT
LASER FIB FLEXIVA PULSE ID 365 (Laser) IMPLANT
MANIFOLD NEPTUNE II (INSTRUMENTS) ×1 IMPLANT
PACK CYSTO (CUSTOM PROCEDURE TRAY) ×1 IMPLANT
SHEATH NAVIGATOR HD 11/13X28 (SHEATH) IMPLANT
SHEATH NAVIGATOR HD 11/13X36 (SHEATH) IMPLANT
STENT URET 6FRX26 CONTOUR (STENTS) IMPLANT
TRACTIP FLEXIVA PULS ID 200XHI (Laser) IMPLANT
TRACTIP FLEXIVA PULSE ID 200 (Laser)
TUBING CONNECTING 10 (TUBING) ×1 IMPLANT
TUBING UROLOGY SET (TUBING) ×1 IMPLANT

## 2022-03-22 NOTE — H&P (Signed)
H&P  Chief Complaint: Left ureteral calculus  History of Present Illness: 82 year old male with a left ureteral calculus who was discharged from the hospital yesterday.  He also has some AKI.  He presents today for left ureteroscopy with laser lithotripsy and ureteral stent placement.  Continues to have some pain and discomfort.  He has an extensive history of nephrolithiasis.  Has undergone multiple procedural interventions in the past.  Past Medical History:  Diagnosis Date   Anticoagulated    plavix   Arthritis    Shoulder, knees, back    CAD in native artery cardiologist-  dr Angelena Form   a. CAD/NSTEMI ,  cardiac cath staged stenting-- 12-25-2016  PTCA and DES x3 to prox. and mid LAD;  12-26-2016  PCI to PLA and DES x1 to midRCA,  EF 60-65%.   Chronic lower back pain    CKD (chronic kidney disease), stage III (HCC)    Complication of anesthesia    "Too much with shoulder surgery", pt. reports that he was told the at they "lost him, due to absorbing too much anesthesia".  shoulder surgery 1985   DDD (degenerative disc disease), lumbosacral    GERD (gastroesophageal reflux disease)    History of gastric ulcer 01/2017   History of kidney stones    History of malignant melanoma    right side of nose   History of non-ST elevation myocardial infarction (NSTEMI) 12/23/2017   s/p  staged cardiac cath,  s/p  PCI and DEStenting   Hyperlipidemia    Hypertension    IDA (iron deficiency anemia)    Iron deficiency anemia due to chronic blood loss 10/05/2021   Myocardial infarction (HCC)    x 2   Neuromuscular disorder (HCC)    neuropathy   Nocturia    RBBB    Renal calculus, right    S/P drug eluting coronary stent placement 12-25-2017,  12-26-2017   PTCA and DES x3 to prox. and mid LAD;  PCI to PLA and DES x1 to midRCA   Type 2 diabetes mellitus treated with insulin ([place])    followed by pcp   Past Surgical History:  Procedure Laterality Date   ANAL FISSURE REPAIR  X 2   BACK SURGERY      CATARACT EXTRACTION W/ INTRAOCULAR LENS  IMPLANT, BILATERAL Bilateral 2017;  2015   COLONOSCOPY WITH PROPOFOL N/A 06/05/2016   Procedure: COLONOSCOPY WITH PROPOFOL;  Surgeon: Garlan Fair, MD;  Location: [facility];  Service: Endoscopy;  Laterality: N/A;   CONVERSION TO TOTAL KNEE Right 08/25/2020   Procedure: Revision right knee unicompartmental arthroplasty to total knee arthroplasty;  Surgeon: Gaynelle Arabian, MD;  Location: [facility];  Service: Orthopedics;  Laterality: Right;   CORONARY ANGIOGRAPHY N/A 12/26/2016   Procedure: CORONARY ANGIOGRAPHY;  Surgeon: Troy Sine, MD;  Location: [place] [facility];  Service: Cardiovascular;  Laterality: N/A;   CORONARY STENT INTERVENTION N/A 12/25/2016   Procedure: CORONARY STENT INTERVENTION;  Surgeon: Burnell Blanks, MD;  Location: [place] [facility];  Service: Cardiovascular;  Laterality: N/A;   CORONARY STENT INTERVENTION N/A 12/26/2016   Procedure: CORONARY STENT INTERVENTION;  Surgeon: Troy Sine, MD;  Location: [place] [facility];  Service: Cardiovascular;  Laterality: N/A;   CYSTOSCOPY/URETEROSCOPY/HOLMIUM LASER/STENT PLACEMENT Right 01/07/2018   Procedure: RIGHT URETEROSCOPY/HOLMIUM LASER/STENT PLACEMENT;  Surgeon: Lucas Mallow, MD;  Location[facility];  Service: Urology;  Laterality: Right;   ESOPHAGOGASTRODUODENOSCOPY (EGD) WITH PROPOFOL N/A 01/12/2017   Procedure: ESOPHAGOGASTRODUODENOSCOPY (  EGD) WITH PROPOFOL;  Surgeon: Wilford Corner, MD;  Location: [place];  Service: Endoscopy;  Laterality: N/A;   HAND TENDON SURGERY Right 10-29-2002   dr Burney Gauze @[facility]$    right index and thumb   KNEE ARTHROSCOPY Bilateral 2009-;2010   @[facility]$    LEFT HEART CATH AND CORONARY ANGIOGRAPHY N/A 12/25/2016   Procedure: LEFT HEART CATH AND CORONARY ANGIOGRAPHY;  Surgeon: Burnell Blanks, MD;  Location: [place] [facility];  Service: Cardiovascular;  Laterality: N/A;   LEFT HEART CATH AND CORONARY  ANGIOGRAPHY N/A 10/10/2019   Procedure: LEFT HEART CATH AND CORONARY ANGIOGRAPHY;  Surgeon: Troy Sine, MD;  Location: [place] [facility];  Service: Cardiovascular;  Laterality: N/A;   LUMBAR LAMINECTOMY/DECOMPRESSION MICRODISCECTOMY N/A 06/09/2015   Procedure: Laminectomy - T12-L1;  Surgeon: Eustace Moore, MD;  Location: [place] [facility];  Service: Neurosurgery;  Laterality: N/A;  Laminectomy - T12-L1   MEDIAL PARTIAL KNEE REPLACEMENT Bilateral 2009-2010    [facility]    MELANOMA EXCISION Right    "side of my nose"   REVERSE SHOULDER ARTHROPLASTY Right 03/31/2021   Procedure: REVERSE SHOULDER ARTHROPLASTY;  Surgeon: Justice Britain, MD;  Location: [facility];  Service: Orthopedics;  Laterality: Right;  141mn   SHOULDER SURGERY Left 1985   TOTAL SHOULDER ARTHROPLASTY Left 12/06/2012   Procedure: LEFT TOTAL SHOULDER ARTHROPLASTY VERSES A REVERSE TOTAL SHOULDER ARTHROPLASTY;  Surgeon: SAugustin Schooling MD;  Location: [facility]  Service: Orthopedics;  Laterality: Left;    Home Medications:  Medications Prior to Admission  Medication Sig Dispense Refill Last Dose   acetaminophen (TYLENOL) 500 MG tablet Take 1,000 mg by mouth every 6 (six) hours as needed for mild pain.   Past Month   amoxicillin (AMOXIL) 500 MG capsule Take 2,000 mg by mouth See admin instructions. Take 2000 mg by mouth 1 hour prior to dental work   Past Month   aspirin EC 81 MG tablet Take 1 tablet (81 mg total) by mouth daily for 21 days. Then stop aspirin and continue taking Plavix 21 tablet 0 03/21/2022   empagliflozin (JARDIANCE) 25 MG TABS tablet Take 12.5 mg by mouth daily.   Past Week   ezetimibe (ZETIA) 10 MG tablet Take 1 tablet (10 mg total) by mouth daily. 90 tablet 3 Past Week   losartan (COZAAR) 50 MG tablet Take 1 tablet (50 mg total) by mouth daily. (Patient taking differently: Take 50 mg by mouth at bedtime.) 90 tablet 1 Past Week   Omega-3 Fatty Acids (FISH OIL) 1000 MG CAPS Take 1,000 mg by mouth daily.   Past Week    pantoprazole (PROTONIX) 40 MG tablet Take 40 mg by mouth daily.   Past Week   rosuvastatin (CRESTOR) 10 MG tablet Take 5 mg by mouth at bedtime.   Past Week   tamsulosin (FLOMAX) 0.4 MG CAPS capsule Take 0.4 mg by mouth daily after supper.   Past Week   Vitamin D, Cholecalciferol, 25 MCG (1000 UT) CAPS Take 1,000 Units by mouth daily.    Past Week   Zinc 50 MG CAPS Take 50 mg by mouth daily.   Past Week   clopidogrel (PLAVIX) 75 MG tablet Take 1 tablet (75 mg total) by mouth daily. Take aspirin and Plavix together for 21 days, then stop aspirin and continue taking Plavix indefinitely. 30 tablet 3 More than a month   Continuous Blood Gluc Receiver (FREESTYLE LIBRE 2 READER) DEVI As directed 1 each 0    Continuous Blood Gluc Sensor (FREESTYLE LIBRE  2 SENSOR) MISC 1 Piece by Does not apply route every 14 (fourteen) days. 2 each 3    ferrous gluconate (FERGON) 324 MG tablet Take 324 mg by mouth 3 (three) times a week. Mon, Wed, friday   Unknown   insulin aspart protamine- aspart (NOVOLOG MIX 70/30) (70-30) 100 UNIT/ML injection Inject 10 Units into the skin 2 (two) times daily with a meal.   More than a month   isosorbide mononitrate (IMDUR) 60 MG 24 hr tablet Take 60 mg by mouth daily.   Unknown   metoprolol tartrate (LOPRESSOR) 25 MG tablet TAKE 1 TABLET BY MOUTH TWICE A DAY (Patient not taking: Reported on 03/20/2022) 180 tablet 1    nitroGLYCERIN (NITROSTAT) 0.4 MG SL tablet Place 1 tablet (0.4 mg total) under the tongue every 5 (five) minutes as needed for chest pain. 30 tablet 12 More than a month   ondansetron (ZOFRAN) 4 MG tablet Take 1 tablet (4 mg total) by mouth every 8 (eight) hours as needed for nausea or vomiting. 10 tablet 0 More than a month   Allergies:  Allergies  Allergen Reactions   Statins Other (See Comments)    Severe stomach pain.   Oxycodone Itching   Hydrocodone Other (See Comments)    "messes with my hearing"   Codeine Itching   Morphine And Related Itching    Family  History  Problem Relation Age of Onset   CAD Father    Heart failure Father    CAD Sister    Stroke Mother    Heart failure Mother    Social History:  reports that he has never smoked. He has never used smokeless tobacco. He reports that he does not drink alcohol and does not use drugs.  ROS: A complete review of systems was performed.  All systems are negative except for pertinent findings as noted. ROS   Physical Exam:  Vital signs in last 24 hours: Temp:  [97.9 F (36.6 C)-98.5 F (36.9 C)] 98.5 F (36.9 C) (02/14 0900) Pulse Rate:  [54-63] 63 (02/14 0900) Resp:  [12-16] 16 (02/14 0900) BP: (115-141)/(62-86) 115/86 (02/14 0900) SpO2:  [93 %-97 %] 97 % (02/14 0900) Weight:  [79.4 kg] 79.4 kg (02/14 0943) General:  Alert and oriented, No acute distress HEENT: Normocephalic, atraumatic Neck: No JVD or lymphadenopathy Cardiovascular: Regular rate and rhythm Lungs: Regular rate and effort Abdomen: Soft, nontender, nondistended, no abdominal masses Back: No CVA tenderness Extremities: No edema Neurologic: Grossly intact  Laboratory Data:  Results for orders placed or performed during the hospital encounter of 03/22/22 (from the past 24 hour(s))  Glucose, capillary     Status: Abnormal   Collection Time: 03/22/22  9:26 AM  Result Value Ref Range   Glucose-Capillary 155 (H) 70 - 99 mg/dL   Comment 1 Notify RN    Comment 2 Document in Chart    No results found for this or any previous visit (from the past 240 hour(s)). Creatinine: Recent Labs    03/20/22 1014 03/20/22 1509 03/21/22 0310 03/21/22 1346  CREATININE 2.01* 1.60* 2.27* 2.42*    Impression/Assessment:  Left ureteral calculus  Plan:  Proceed with cystoscopy with left retrograde pyelogram, left ureteroscopy with laser lithotripsy and ureteral stent placement.  Risk and benefits discussed.  Marton Redwood, III 03/22/2022, 10:22 AM

## 2022-03-22 NOTE — Op Note (Signed)
Operative Note  Preoperative diagnosis:  1.  Left ureteral calculus  Postoperative diagnosis: 1.  Left ureteral calculus  Procedure(s): 1.  Cystoscopy with left retrograde pyelogram, left ureteroscopy with stone extraction, ureteral stent placement  Surgeon: Link Snuffer, MD  Assistants: None  Anesthesia: General  Complications: None immediate  EBL: Minimal  Specimens: 1.  Ureteral calculus  Drains/Catheters: 1.  6 x 26 double-J ureteral stent  Intraoperative findings: 1.  Normal anterior urethra 2.  Moderately obstructing prostate 3.  Bladder mucosa without any tumors or masses. 3.  Left ureteroscopy revealed a 6 mm ureteral calculus basket extracted.  No other ureteral calculi.  Retrograde pyelogram revealed hydroureteronephrosis.  No filling defect in the kidney.  Indication: 82 year old male with a distal left ureteral calculus presents for the previously mentioned operation.  Description of procedure:  The patient was identified and consent was obtained.  The patient was taken to the operating room and placed in the supine position.  The patient was placed under general anesthesia.  Perioperative antibiotics were administered.  The patient was placed in dorsal lithotomy.  Patient was prepped and draped in a standard sterile fashion and a timeout was performed.  A 21 French rigid cystoscope was advanced into the urethra and into the bladder.  Complete cystoscopy was performed with the findings noted above.  The left ureter was cannulated with a sensor wire which was advanced up to the kidney under fluoroscopic guidance.  Semirigid ureteroscopy was performed alongside the wire and the stone with basket extracted.  I advanced the scope up to the renal pelvis and no other calculi were seen.  I shot a retrograde pyelogram through the scope with the findings noted above.  I withdrew the scope visualizing the ureter upon removal.  There was some ureteral edema at the ureterovesicular  junction.  There was no injury to the ureter.  I backloaded the wire onto the rigid cystoscope and advanced that into the bladder followed by routine placement of a 6 x 26 double-J ureteral stent.  Fluoroscopy confirmed proximal placement and direct visualization confirmed a good coil within the bladder.  I drained the bladder and withdrew the scope.  Patient tolerated the procedure well was stable postoperative.  Plan: Follow-up in 1 week for stent removal

## 2022-03-22 NOTE — Discharge Instructions (Addendum)

## 2022-03-22 NOTE — Anesthesia Preprocedure Evaluation (Addendum)
Anesthesia Evaluation  Patient identified by MRN, date of birth, ID band Patient awake    Reviewed: Allergy & Precautions, NPO status , Patient's Chart, lab work & pertinent test results  Airway Mallampati: II  TM Distance: >3 FB Neck ROM: Full    Dental no notable dental hx. (+) Dental Advisory Given, Teeth Intact   Pulmonary    Pulmonary exam normal breath sounds clear to auscultation       Cardiovascular hypertension, Pt. on medications and Pt. on home beta blockers + CAD, + Past MI, + Cardiac Stents and + Peripheral Vascular Disease  Normal cardiovascular exam+ dysrhythmias  Rhythm:Regular Rate:Normal  Echo 03/21/2022  1. Left ventricular ejection fraction, by estimation, is 65 to 70%. The left ventricle has normal function. The left ventricle has no regional wall motion abnormalities. There is mild concentric left ventricular hypertrophy. Left ventricular diastolic parameters are consistent with Grade I diastolic dysfunction (impaired relaxation).   2. Right ventricular systolic function is normal. The right ventricular size is normal. There is normal pulmonary artery systolic pressure.   3. Left atrial size was mildly dilated.   4. The mitral valve is grossly normal. Trivial mitral valve regurgitation. No evidence of mitral stenosis.   5. The aortic valve is tricuspid. There is mild calcification of the aortic valve. There is moderate thickening of the aortic valve. Aortic valve regurgitation is not visualized. Aortic valve sclerosis/calcification is present, without any evidence of aortic stenosis.   6. Aortic dilatation noted. There is borderline dilatation of the ascending aorta, measuring 38 mm.   7. The inferior vena cava is normal in size with greater than 50% respiratory variability, suggesting right atrial pressure of 3 mmHg.   Comparison(s): No significant change from prior study.      Neuro/Psych CVA     GI/Hepatic PUD,GERD  Medicated,,  Endo/Other  diabetes, Type 2, Oral Hypoglycemic Agents    Renal/GU CRFRenal disease     Musculoskeletal  (+) Arthritis , Osteoarthritis,    Abdominal   Peds  Hematology  (+) Blood dyscrasia, anemia   Anesthesia Other Findings ? Mid Cx lesion is 70% stenosed. ? 1st Diag lesion is 40% stenosed. ? Previously placed Prox LAD to Mid LAD stent (unknown type) is widely patent. ? Previously placed Prox RCA to Mid RCA stent (unknown type) is widely patent. ? RV Branch lesion is 95% stenosed. ? Prox RCA lesion is 50% stenosed. ? RPDA lesion is 60% stenosed. ? 3rd RPL lesion is 20% stenosed. ? The left ventricular systolic function is normal. ? LV end diastolic pressure is normal. ? The left ventricular ejection fraction is 55-65% by visual estimate.   Very long left main coronary artery which gives rise to a large LAD and very small caliber circumflex vessel.    The previously placed tandem stents in the LAD are widely patent.  There is mild 40% narrowing in a diagonal vessel proximal to the stented segment.     The left circumflex vessel is very small caliber with narrowing 70- 75% proximally which does not appear to be significantly change from the prior study.   The RCA is a large dominant vessel.  The stent in the proximal to mid RCA is widely patent.  The very proximal RCA has 50% narrowing and gives rise to a SA nodal artery.  There is 90-95% ostial stenosis in this small proximal branch, the PDA has previously noted diffuse 60% proximal to mid stenosis, and the PLA vessel site of prior PTCA  is patent with residual narrowing less than 20%.   Normal LV function with EF estimate approximately 60%.  LVEDP 15 mmHg.   RECOMMENDATION: There is no significant change in the previously stented segments of the LAD and RCA with patent PTCA site of the PLA vessel..  There does appear to be progression of disease in the very proximal RCA which gives rise to a  small branch with 90-95% ostial stenosis.  Circumflex vessel is very small caliber vessel.  Recommend increase medical therapy trial; your had just been increased to 90 mg daily.  Depending upon heart rate, consider further titration of beta-blocker therapy.  The patient is on PCSK9 inhibition in addition to Zetia for aggressive lipid-lowering.   Recommendations  Antiplatelet/Anticoag Recommend dual antiplatelet therapy. He has been on aspirin/Plavix, would continue long-term.  Indications  Angina pectoris ([place]) [I20.9 (ICD-10-CM)]  Procedural Details  Technical Details [name] is a 82 year old gentleman who has a history of hypertension, diabetes mellitus, hyperlipidemia, and CAD. He has previously undergone stenting of tandem stenoses in his proximal to mid LAD as well as stenting of his proximal to mid RCA and PTCA of his PLA vessel.  He has been on chronic aspirin Plavix therapy.  Recently developed several episodes of chest discomfort which were nitrate responsive.  He was evaluated in the office this week by Kathrynn Humble, NP and is referred for definitive repeat cardiac catheterization.  The patient was brought to the cardiac catheterization lab in the fasting state. The patient was premedicated with Versed 2 mg and fentanyl 50  mcg.  Percent guidance was used for right radial access.  The right radial artery was punctured via the Seldinger technique, and a 6 [place] Glidesheath Slender was inserted without difficulty.  A radial cocktail consisting of Verapamil 3 mg was administered. The patient received 4500 units of weight adjusted heparin. A safety J wire was advanced into the ascending aorta. Diagnostic catheterization was done with a 5 [place] TIG 4.0 catheter. A 5 [place] JR4 catheter was used for left ventriculography.  Old films were brought up for review for comparative purposes.  A TR radial band was applied for hemostasis. The patient left the catheterization laboratory in  stable condition.   Estimated blood loss <50 mL.   During this procedure medications were administered to achieve and maintain moderate conscious sedation while the patient's heart rate, blood pressure, and oxygen saturation were continuously monitored and I was present face-to-face 100% of this time.  Medications (Filter: Administrations occurring from 0724 to 0849 on 10/10/19)  important  Continuous medications are totaled by the amount administered until 10/10/19 0849.   Heparin (Porcine) in NaCl 1000-0.9 UT/500ML-% SOLN (mL) Total volume:  1,000 mL  Date/Time Rate/Dose/Volume Action  10/10/19 0743 500 mL Given  0743 500 mL Given   fentaNYL (SUBLIMAZE) injection (mcg) Total dose:  50 mcg  Date/Time Rate/Dose/Volume Action  10/10/19 0743 25 mcg Given  0800 25 mcg Given   midazolam (VERSED) injection (mg) Total dose:  2 mg  Date/Time Rate/Dose/Volume Action  10/10/19 0743 1 mg Given  0800 1 mg Given   lidocaine (PF) (XYLOCAINE) 1 % injection (mL) Total volume:  2 mL  Date/Time Rate/Dose/Volume Action  10/10/19 0756 2 mL Given   Radial Cocktail/Verapamil only (mL) Total volume:  10 mL  Date/Time Rate/Dose/Volume Action  10/10/19 0801 10 mL Given   heparin sodium (porcine) injection (Units) Total dose:  4,500 Units  Date/Time Rate/Dose/Volume Action  10/10/19 0816 4,500 Units Given  iohexol (OMNIPAQUE) 350 MG/ML injection (mL) Total volume:  80 mL  Date/Time Rate/Dose/Volume Action  10/10/19 0839 80 mL Given                          Reproductive/Obstetrics                              Anesthesia Physical Anesthesia Plan  ASA: 4  Anesthesia Plan: General   Post-op Pain Management: Tylenol PO (pre-op)*   Induction: Intravenous  PONV Risk Score and Plan: 2 and Ondansetron, Midazolam and Treatment may vary due to age or medical condition  Airway Management Planned: LMA  Additional Equipment:  None  Intra-op Plan:   Post-operative Plan: Extubation in OR  Informed Consent: I have reviewed the patients History and Physical, chart, labs and discussed the procedure including the risks, benefits and alternatives for the proposed anesthesia with the patient or authorized representative who has indicated his/her understanding and acceptance.     Dental advisory given  Plan Discussed with: CRNA  Anesthesia Plan Comments: (See PAT note 03/21/2021, Konrad Felix Ward, PA-C)         Anesthesia Quick Evaluation                                  Anesthesia Evaluation  Patient identified by MRN, date of birth, ID band Patient awake    Reviewed: Allergy & Precautions, NPO status , Patient's Chart, lab work & pertinent test results, reviewed documented beta blocker date and time   History of Anesthesia Complications Negative for: history of anesthetic complications  Airway Mallampati: II  TM Distance: >3 FB Neck ROM: Full    Dental  (+) Dental Advisory Given   Pulmonary  08/24/2020 SARS coronavirus NEG   breath sounds clear to auscultation       Cardiovascular hypertension, Pt. on medications and Pt. on home beta blockers (-) angina+ CAD, + Past MI, + Cardiac Stents and + Peripheral Vascular Disease   Rhythm:Regular Rate:Normal  '21 Cath: EF 60% with normal LVF, There is no significant change in the previously stented segments of the LAD and RCA with patent PTCA site of the PLA vessel..  There does appear to be progression of disease in the very proximal RCA which gives rise to a small branch with 90-95% ostial stenosis.  Circumflex vessel is very small caliber vessel. Medical management  '20 Nuclear stress EF: 69%. There was no ST segment deviation noted during stress. No T wave inversion was noted during stress. This is a low risk study. EF is hyperdynamic (>65%).      Neuro/Psych negative neurological ROS     GI/Hepatic Neg liver ROS, GERD   Controlled,  Endo/Other  diabetes (glu 176), Insulin Dependentobese  Renal/GU Renal InsufficiencyRenal disease (creat 1.37)     Musculoskeletal  (+) Arthritis ,   Abdominal (+) + obese,   Peds  Hematology negative hematology ROS (+) plavix   Anesthesia Other Findings   Reproductive/Obstetrics

## 2022-03-22 NOTE — Transfer of Care (Signed)
Immediate Anesthesia Transfer of Care Note  Patient: ELFEGO SEDOR  Procedure(s) Performed: CYSTOSCOPY LEFT URETEROSCOPY/STENT PLACEMENT (Left)  Patient Location: PACU  Anesthesia Type:General  Level of Consciousness: sedated  Airway & Oxygen Therapy: Patient Spontanous Breathing and Patient connected to face mask oxygen  Post-op Assessment: Report given to RN and Post -op Vital signs reviewed and stable  Post vital signs: Reviewed and stable  Last Vitals:  Vitals Value Taken Time  BP    Temp    Pulse 74 03/22/22 1109  Resp 14 03/22/22 1109  SpO2 100 % 03/22/22 1109  Vitals shown include unvalidated device data.  Last Pain:  Vitals:   03/22/22 0943  TempSrc:   PainSc: 4       Patients Stated Pain Goal: 3 ([id number])  Complications: No notable events documented.

## 2022-03-22 NOTE — Anesthesia Postprocedure Evaluation (Signed)
Anesthesia Post Note  Patient: Matthew [name]  Procedure(s) Performed: CYSTOSCOPY LEFT URETEROSCOPY/STENT PLACEMENT (Left)     Patient location during evaluation: PACU Anesthesia Type: General Level of consciousness: sedated and patient cooperative Pain management: pain level controlled Vital Signs Assessment: post-procedure vital signs reviewed and stable Respiratory status: spontaneous breathing Cardiovascular status: stable Anesthetic complications: no   No notable events documented.  Last Vitals:  Vitals:   03/22/22 1115 03/22/22 1130  BP: (!) 190/79 (!) 186/78  Pulse: 63 66  Resp: 13 12  Temp:    SpO2: 100% 97%    Last Pain:  Vitals:   03/22/22 1130  TempSrc:   PainSc: 0-No pain                 Nolon Nations

## 2022-03-22 NOTE — Anesthesia Procedure Notes (Signed)
Procedure Name: LMA Insertion Date/Time: 03/22/2022 10:36 AM  Performed by: Lind Covert, CRNAPre-anesthesia Checklist: Patient identified, Emergency Drugs available, Suction available, Patient being monitored and Timeout performed Patient Re-evaluated:Patient Re-evaluated prior to induction Oxygen Delivery Method: Circle system utilized Preoxygenation: Pre-oxygenation with 100% oxygen Induction Type: IV induction LMA Size: 4.0 Tube type: Oral Number of attempts: 3 (1st 2 attempts large airleak, 3 attempted with minimal leak) Placement Confirmation: positive ETCO2 and breath sounds checked- equal and bilateral Tube secured with: Tape Dental Injury: Teeth and Oropharynx as per pre-operative assessment

## 2022-03-23 ENCOUNTER — Encounter (HOSPITAL_COMMUNITY): Payer: Self-pay | Admitting: Urology

## 2022-03-27 DIAGNOSIS — M6281 Muscle weakness (generalized): Secondary | ICD-10-CM | POA: Diagnosis not present

## 2022-03-27 DIAGNOSIS — E1122 Type 2 diabetes mellitus with diabetic chronic kidney disease: Secondary | ICD-10-CM | POA: Diagnosis not present

## 2022-03-27 DIAGNOSIS — I69398 Other sequelae of cerebral infarction: Secondary | ICD-10-CM | POA: Diagnosis not present

## 2022-03-27 DIAGNOSIS — I2510 Atherosclerotic heart disease of native coronary artery without angina pectoris: Secondary | ICD-10-CM | POA: Diagnosis not present

## 2022-03-27 DIAGNOSIS — Z794 Long term (current) use of insulin: Secondary | ICD-10-CM | POA: Diagnosis not present

## 2022-03-27 DIAGNOSIS — Z7984 Long term (current) use of oral hypoglycemic drugs: Secondary | ICD-10-CM | POA: Diagnosis not present

## 2022-03-27 DIAGNOSIS — D509 Iron deficiency anemia, unspecified: Secondary | ICD-10-CM | POA: Diagnosis not present

## 2022-03-27 DIAGNOSIS — N183 Chronic kidney disease, stage 3 unspecified: Secondary | ICD-10-CM | POA: Diagnosis not present

## 2022-03-27 DIAGNOSIS — I129 Hypertensive chronic kidney disease with stage 1 through stage 4 chronic kidney disease, or unspecified chronic kidney disease: Secondary | ICD-10-CM | POA: Diagnosis not present

## 2022-03-27 DIAGNOSIS — M545 Low back pain, unspecified: Secondary | ICD-10-CM | POA: Diagnosis not present

## 2022-03-28 ENCOUNTER — Telehealth: Payer: Self-pay | Admitting: *Deleted

## 2022-03-28 NOTE — Progress Notes (Signed)
  Care Coordination   Note   03/28/2022 Name: Matthew [name] MRN: [id number] DOB: 10-20-1940  [name] is a 82 y.o. year old male who sees Wenda Low, MD for primary care. I reached out to Gilda Crease by phone today to offer care coordination services.  [name] was given information about Care Coordination services today including:   The Care Coordination services include support from the care team which includes your Nurse Coordinator, Clinical Social Worker, or Pharmacist.  The Care Coordination team is here to help remove barriers to the health concerns and goals most important to you. Care Coordination services are voluntary, and the patient may decline or stop services at any time by request to their care team member.   Care Coordination Consent Status: Patient did not agree to participate in care coordination services at this time.    Encounter Outcome:  Pt. Refused  [place]  Direct Dial: [phone]

## 2022-03-29 DIAGNOSIS — N183 Chronic kidney disease, stage 3 unspecified: Secondary | ICD-10-CM | POA: Diagnosis not present

## 2022-03-29 DIAGNOSIS — M545 Low back pain, unspecified: Secondary | ICD-10-CM | POA: Diagnosis not present

## 2022-03-29 DIAGNOSIS — M6281 Muscle weakness (generalized): Secondary | ICD-10-CM | POA: Diagnosis not present

## 2022-03-29 DIAGNOSIS — I69398 Other sequelae of cerebral infarction: Secondary | ICD-10-CM | POA: Diagnosis not present

## 2022-03-29 DIAGNOSIS — I129 Hypertensive chronic kidney disease with stage 1 through stage 4 chronic kidney disease, or unspecified chronic kidney disease: Secondary | ICD-10-CM | POA: Diagnosis not present

## 2022-03-29 DIAGNOSIS — Z7984 Long term (current) use of oral hypoglycemic drugs: Secondary | ICD-10-CM | POA: Diagnosis not present

## 2022-03-29 DIAGNOSIS — E1122 Type 2 diabetes mellitus with diabetic chronic kidney disease: Secondary | ICD-10-CM | POA: Diagnosis not present

## 2022-03-29 DIAGNOSIS — I2510 Atherosclerotic heart disease of native coronary artery without angina pectoris: Secondary | ICD-10-CM | POA: Diagnosis not present

## 2022-03-29 DIAGNOSIS — D509 Iron deficiency anemia, unspecified: Secondary | ICD-10-CM | POA: Diagnosis not present

## 2022-03-29 DIAGNOSIS — Z794 Long term (current) use of insulin: Secondary | ICD-10-CM | POA: Diagnosis not present

## 2022-03-30 DIAGNOSIS — N201 Calculus of ureter: Secondary | ICD-10-CM | POA: Diagnosis not present

## 2022-04-04 DIAGNOSIS — M545 Low back pain, unspecified: Secondary | ICD-10-CM | POA: Diagnosis not present

## 2022-04-04 DIAGNOSIS — I129 Hypertensive chronic kidney disease with stage 1 through stage 4 chronic kidney disease, or unspecified chronic kidney disease: Secondary | ICD-10-CM | POA: Diagnosis not present

## 2022-04-04 DIAGNOSIS — I69398 Other sequelae of cerebral infarction: Secondary | ICD-10-CM | POA: Diagnosis not present

## 2022-04-04 DIAGNOSIS — M6281 Muscle weakness (generalized): Secondary | ICD-10-CM | POA: Diagnosis not present

## 2022-04-04 DIAGNOSIS — E1122 Type 2 diabetes mellitus with diabetic chronic kidney disease: Secondary | ICD-10-CM | POA: Diagnosis not present

## 2022-04-04 DIAGNOSIS — Z7984 Long term (current) use of oral hypoglycemic drugs: Secondary | ICD-10-CM | POA: Diagnosis not present

## 2022-04-04 DIAGNOSIS — I2510 Atherosclerotic heart disease of native coronary artery without angina pectoris: Secondary | ICD-10-CM | POA: Diagnosis not present

## 2022-04-04 DIAGNOSIS — D509 Iron deficiency anemia, unspecified: Secondary | ICD-10-CM | POA: Diagnosis not present

## 2022-04-04 DIAGNOSIS — N183 Chronic kidney disease, stage 3 unspecified: Secondary | ICD-10-CM | POA: Diagnosis not present

## 2022-04-04 DIAGNOSIS — Z794 Long term (current) use of insulin: Secondary | ICD-10-CM | POA: Diagnosis not present

## 2022-04-06 DIAGNOSIS — R509 Fever, unspecified: Secondary | ICD-10-CM | POA: Diagnosis not present

## 2022-04-06 DIAGNOSIS — R051 Acute cough: Secondary | ICD-10-CM | POA: Diagnosis not present

## 2022-04-12 DIAGNOSIS — N200 Calculus of kidney: Secondary | ICD-10-CM | POA: Diagnosis not present

## 2022-04-23 NOTE — Progress Notes (Unsigned)
'  No chief complaint on file.  History of Present Illness: 82 yo male with history of DM, HTN and CAD here today for follow up. He was admitted to [facility] November 2018 with a NSTEMI. Cardiac cath November 2018 with severe stenosis in the mid LAD and mid RCA treated with 3 drug eluting stents in LAD and one drug eluting stent in the mid RCA. The posterolateral branch had a severe stenosis treated with balloon angioplasty. Echo November 2018 with normal LV systolic function. He was placed on ASA and Brilinta but admitted December 2018 with melena and chest pain. EGD showed a non bleeding ulcer and gastritis. ASA was stopped and Brilinta was continued.  He had severe abdominal pains while on a statin and stopped this. He has tolerated Zetia. He was seen in March 2020 by Ermalinda Barrios, PA and had c/o chest pain when walking in cold weather. Imdur was increased. Nuclear stress test 04/15/18 with no ischemia. He was admitted to [facility] September 2021 with chest pain. Cardiac cath 10/10/19 with patent LAD and RCA stents with moderate stenosis in the small Circumflex and moderate stenosis in the PDA. Also severe disease in an RV marginal branch. Normal LV function. Medical therapy recommended. He has since been on Plavix. I saw him in the office 09/12/21 and he was c/o dizziness with HR in the 50s at home. Norvasc was stopped at that visit due to hypotension. Echo 09/20/21 with LVEF=60-65%. Normal RV function. Mild MR. Cardiac monitor with sinus brady, lowest HR 45 bpm with no high grade AV block. One 4 beat run of SVT. Rare PACs/PVCs. He was admitted to [facility] in February 2024 with altered mental status in the setting of kidney stone, dehydration and opioids. Brain MRI with no acute stroke. Echo 2/132/4 with LVEF=65-70%. No significant valve disease.   He is here today for follow up. The patient denies any chest pain, dyspnea, palpitations, lower extremity edema, orthopnea, PND, dizziness, near syncope or syncope.     Primary  Care Physician: Wenda Low, MD  Past Medical History:  Diagnosis Date   Anticoagulated    plavix   Arthritis    Shoulder, knees, back    CAD in native artery cardiologist-  dr Angelena Form   a. CAD/NSTEMI ,  cardiac cath staged stenting-- 12-25-2016  PTCA and DES x3 to prox. and mid LAD;  12-26-2016  PCI to PLA and DES x1 to midRCA,  EF 60-65%.   Chronic lower back pain    CKD (chronic kidney disease), stage III (HCC)    Complication of anesthesia    "Too much with shoulder surgery", pt. reports that he was told the at they "lost him, due to absorbing too much anesthesia".  shoulder surgery 1985   DDD (degenerative disc disease), lumbosacral    GERD (gastroesophageal reflux disease)    History of gastric ulcer 01/2017   History of kidney stones    History of malignant melanoma    right side of nose   History of non-ST elevation myocardial infarction (NSTEMI) 12/23/2017   s/p  staged cardiac cath,  s/p  PCI and DEStenting   Hyperlipidemia    Hypertension    IDA (iron deficiency anemia)    Iron deficiency anemia due to chronic blood loss 10/05/2021   Myocardial infarction (HCC)    x 2   Neuromuscular disorder (HCC)    neuropathy   Nocturia    RBBB    Renal calculus, right    S/P drug eluting coronary  stent placement 12-25-2017,  12-26-2017   PTCA and DES x3 to prox. and mid LAD;  PCI to PLA and DES x1 to midRCA   Type 2 diabetes mellitus treated with insulin ([place])    followed by pcp    Past Surgical History:  Procedure Laterality Date   ANAL FISSURE REPAIR  X 2   BACK SURGERY     CATARACT EXTRACTION W/ INTRAOCULAR LENS  IMPLANT, BILATERAL Bilateral 2017;  2015   COLONOSCOPY WITH PROPOFOL N/A 06/05/2016   Procedure: COLONOSCOPY WITH PROPOFOL;  Surgeon: Garlan Fair, MD;  Location: [facility];  Service: Endoscopy;  Laterality: N/A;   CONVERSION TO TOTAL KNEE Right 08/25/2020   Procedure: Revision right knee unicompartmental arthroplasty to total knee arthroplasty;   Surgeon: Gaynelle Arabian, MD;  Location: [facility];  Service: Orthopedics;  Laterality: Right;   CORONARY ANGIOGRAPHY N/A 12/26/2016   Procedure: CORONARY ANGIOGRAPHY;  Surgeon: Troy Sine, MD;  Location: [place] [facility];  Service: Cardiovascular;  Laterality: N/A;   CORONARY STENT INTERVENTION N/A 12/25/2016   Procedure: CORONARY STENT INTERVENTION;  Surgeon: Burnell Blanks, MD;  Location: [place] [facility];  Service: Cardiovascular;  Laterality: N/A;   CORONARY STENT INTERVENTION N/A 12/26/2016   Procedure: CORONARY STENT INTERVENTION;  Surgeon: Troy Sine, MD;  Location: [place] [facility];  Service: Cardiovascular;  Laterality: N/A;   CYSTOSCOPY/URETEROSCOPY/HOLMIUM LASER/STENT PLACEMENT Right 01/07/2018   Procedure: RIGHT URETEROSCOPY/HOLMIUM LASER/STENT PLACEMENT;  Surgeon: Lucas Mallow, MD;  Location: [facility];  Service: Urology;  Laterality: Right;   CYSTOSCOPY/URETEROSCOPY/HOLMIUM LASER/STENT PLACEMENT Left 03/22/2022   Procedure: CYSTOSCOPY LEFT URETEROSCOPY/STENT PLACEMENT;  Surgeon: Lucas Mallow, MD;  Location: [facility];  Service: Urology;  Laterality: Left;   ESOPHAGOGASTRODUODENOSCOPY (EGD) WITH PROPOFOL N/A 01/12/2017   Procedure: ESOPHAGOGASTRODUODENOSCOPY (EGD) WITH PROPOFOL;  Surgeon: Wilford Corner, MD;  Location: [place];  Service: Endoscopy;  Laterality: N/A;   HAND TENDON SURGERY Right 10-29-2002   dr Burney Gauze @[facility]    right index and thumb   KNEE ARTHROSCOPY Bilateral 2009-;2010   @[facility]    LEFT HEART CATH AND CORONARY ANGIOGRAPHY N/A 12/25/2016   Procedure: LEFT HEART CATH AND CORONARY ANGIOGRAPHY;  Surgeon: Burnell Blanks, MD;  Location: [place] [facility];  Service: Cardiovascular;  Laterality: N/A;   LEFT HEART CATH AND CORONARY ANGIOGRAPHY N/A 10/10/2019   Procedure: LEFT HEART CATH AND CORONARY ANGIOGRAPHY;  Surgeon: Troy Sine, MD;  Location: [place] [facility];  Service: Cardiovascular;  Laterality:  N/A;   LUMBAR LAMINECTOMY/DECOMPRESSION MICRODISCECTOMY N/A 06/09/2015   Procedure: Laminectomy - T12-L1;  Surgeon: Eustace Moore, MD;  Location: [facility];  Service: Neurosurgery;  Laterality: N/A;  Laminectomy - T12-L1   MEDIAL PARTIAL KNEE REPLACEMENT Bilateral 2009-2010    [facility]    MELANOMA EXCISION Right    "side of my nose"   REVERSE SHOULDER ARTHROPLASTY Right 03/31/2021   Procedure: REVERSE SHOULDER ARTHROPLASTY;  Surgeon: Justice Britain, MD;  Location: [facility];  Service: Orthopedics;  Laterality: Right;  170min   SHOULDER SURGERY Left 1985   TOTAL SHOULDER ARTHROPLASTY Left 12/06/2012   Procedure: LEFT TOTAL SHOULDER ARTHROPLASTY VERSES A REVERSE TOTAL SHOULDER ARTHROPLASTY;  Surgeon: Augustin Schooling, MD;  Location: [place];  Service: Orthopedics;  Laterality: Left;    Current Outpatient Medications  Medication Sig Dispense Refill   acetaminophen (TYLENOL) 500 MG tablet Take 1,000 mg by mouth every 6 (six) hours as needed for mild pain.     amoxicillin (AMOXIL)  500 MG capsule Take 2,000 mg by mouth See admin instructions. Take 2000 mg by mouth 1 hour prior to dental work     clopidogrel (PLAVIX) 75 MG tablet Take 1 tablet (75 mg total) by mouth daily. Take aspirin and Plavix together for 21 days, then stop aspirin and continue taking Plavix indefinitely. 30 tablet 3   Continuous Blood Gluc Receiver (FREESTYLE LIBRE 2 READER) DEVI As directed 1 each 0   Continuous Blood Gluc Sensor (FREESTYLE LIBRE 2 SENSOR) MISC 1 Piece by Does not apply route every 14 (fourteen) days. 2 each 3   empagliflozin (JARDIANCE) 25 MG TABS tablet Take 12.5 mg by mouth daily.     ezetimibe (ZETIA) 10 MG tablet Take 1 tablet (10 mg total) by mouth daily. 90 tablet 3   ferrous gluconate (FERGON) 324 MG tablet Take 324 mg by mouth 3 (three) times a week. Mon, Wed, friday     insulin aspart protamine- aspart (NOVOLOG MIX 70/30) (70-30) 100 UNIT/ML injection Inject 10 Units into the skin 2 (two) times daily with  a meal.     isosorbide mononitrate (IMDUR) 60 MG 24 hr tablet Take 60 mg by mouth daily.     losartan (COZAAR) 50 MG tablet Take 1 tablet (50 mg total) by mouth daily. (Patient taking differently: Take 50 mg by mouth at bedtime.) 90 tablet 1   metoprolol tartrate (LOPRESSOR) 25 MG tablet TAKE 1 TABLET BY MOUTH TWICE A DAY (Patient not taking: Reported on 03/20/2022) 180 tablet 1   nitroGLYCERIN (NITROSTAT) 0.4 MG SL tablet Place 1 tablet (0.4 mg total) under the tongue every 5 (five) minutes as needed for chest pain. 30 tablet 12   Omega-3 Fatty Acids (FISH OIL) 1000 MG CAPS Take 1,000 mg by mouth daily.     ondansetron (ZOFRAN) 4 MG tablet Take 1 tablet (4 mg total) by mouth every 8 (eight) hours as needed for nausea or vomiting. 10 tablet 0   pantoprazole (PROTONIX) 40 MG tablet Take 40 mg by mouth daily.     rosuvastatin (CRESTOR) 10 MG tablet Take 5 mg by mouth at bedtime.     tamsulosin (FLOMAX) 0.4 MG CAPS capsule Take 0.4 mg by mouth daily after supper.     traMADol (ULTRAM) 50 MG tablet Take 1 tablet (50 mg total) by mouth every 6 (six) hours as needed. 10 tablet 1   Vitamin D, Cholecalciferol, 25 MCG (1000 UT) CAPS Take 1,000 Units by mouth daily.      Zinc 50 MG CAPS Take 50 mg by mouth daily.     No current facility-administered medications for this visit.    Allergies  Allergen Reactions   Statins Other (See Comments)    Severe stomach pain.   Oxycodone Itching   Hydrocodone Other (See Comments)    "messes with my hearing"   Codeine Itching   Morphine And Related Itching    Social History   Socioeconomic History   Marital status: Married    Spouse name: Not on file   Number of children: Not on file   Years of education: Not on file   Highest education level: Not on file  Occupational History   Not on file  Tobacco Use   Smoking status: Never   Smokeless tobacco: Never  Vaping Use   Vaping Use: Never used  Substance and Sexual Activity   Alcohol use: No   Drug  use: No   Sexual activity: Not Currently  Other Topics Concern   Not  on file  Social History Narrative   Not on file   Social Determinants of Health   Financial Resource Strain: Not on file  Food Insecurity: No Food Insecurity (03/20/2022)   Hunger Vital Sign    Worried About Running Out of Food in the Last Year: Never true    Ran Out of Food in the Last Year: Never true  Transportation Needs: No Transportation Needs (03/20/2022)   PRAPARE - Hydrologist (Medical): No    Lack of Transportation (Non-Medical): No  Physical Activity: Not on file  Stress: Not on file  Social Connections: Not on file  Intimate Partner Violence: Not At Risk (03/20/2022)   Humiliation, Afraid, Rape, and Kick questionnaire    Fear of Current or Ex-Partner: No    Emotionally Abused: No    Physically Abused: No    Sexually Abused: No    Family History  Problem Relation Age of Onset   CAD Father    Heart failure Father    CAD Sister    Stroke Mother    Heart failure Mother     Review of Systems:  As stated in the HPI and otherwise negative.   There were no vitals taken for this visit.  Physical Examination: General: Well developed, well nourished, NAD  HEENT: OP clear, mucus membranes moist  SKIN: warm, dry. No rashes. Neuro: No focal deficits  Musculoskeletal: Muscle strength 5/5 all ext  Psychiatric: Mood and affect normal  Neck: No JVD, no carotid bruits, no thyromegaly, no lymphadenopathy.  Lungs:Clear bilaterally, no wheezes, rhonci, crackles Cardiovascular: Regular rate and rhythm. No murmurs, gallops or rubs. Abdomen:Soft. Bowel sounds present. Non-tender.  Extremities: No lower extremity edema. Pulses are 2 + in the bilateral DP/PT.  Echo February 2024:  1. Left ventricular ejection fraction, by estimation, is 65 to 70%. The  left ventricle has normal function. The left ventricle has no regional  wall motion abnormalities. There is mild concentric left  ventricular  hypertrophy. Left ventricular diastolic  parameters are consistent with Grade I diastolic dysfunction (impaired  relaxation).   2. Right ventricular systolic function is normal. The right ventricular  size is normal. There is normal pulmonary artery systolic pressure.   3. Left atrial size was mildly dilated.   4. The mitral valve is grossly normal. Trivial mitral valve  regurgitation. No evidence of mitral stenosis.   5. The aortic valve is tricuspid. There is mild calcification of the  aortic valve. There is moderate thickening of the aortic valve. Aortic  valve regurgitation is not visualized. Aortic valve  sclerosis/calcification is present, without any evidence of  aortic stenosis.   6. Aortic dilatation noted. There is borderline dilatation of the  ascending aorta, measuring 38 mm.   7. The inferior vena cava is normal in size with greater than 50%   EKG:  EKG is *** ordered today The ekg ordered today demonstrates   Recent Labs: 03/21/2022: ALT 12; Hemoglobin 13.1; Platelets 109 03/22/2022: BUN 27; Creatinine, Ser 2.03; Potassium 3.8; Sodium 139   Lipid Panel    Component Value Date/Time   CHOL 132 03/21/2022 0310   CHOL 182 11/11/2020 0855   TRIG 142 03/21/2022 0310   HDL 34 (L) 03/21/2022 0310   HDL 40 11/11/2020 0855   CHOLHDL 3.9 03/21/2022 0310   VLDL 28 03/21/2022 0310   LDLCALC 70 03/21/2022 0310   LDLCALC 117 (H) 11/11/2020 0855     Wt Readings from Last 3 Encounters:  03/22/22 79.4 kg  03/20/22 78.2 kg  10/31/21 79.3 kg     Assessment and Plan:   1. CAD with angina: No chest pain. Drug eluting stents placed in the LAD and RCA in November 2018 and CAD stable by cath in September 2021. He has normal LV systolic function by echo in February 2024. *** Will continue ASA, statin, zetia, Imdur and beta blocker.   2. Hyperlipidemia: Lipids followed at the [place]. Goal LDL under 70. Continue statin and Zetia.   3. HTN: BP is well controlled  Labs/  tests ordered today include:  No orders of the defined types were placed in this encounter.  Disposition:   F/U with me in 12 months  Signed, Lauree Chandler, MD 04/23/2022 11:38 AM    [facility] [address], [place], [place]  [address] Phone: [phone]; Fax: [phone]

## 2022-04-24 ENCOUNTER — Telehealth: Payer: Self-pay | Admitting: Cardiovascular Disease

## 2022-04-24 ENCOUNTER — Ambulatory Visit: Payer: Medicare Other | Attending: Cardiovascular Disease | Admitting: Cardiovascular Disease

## 2022-04-24 ENCOUNTER — Encounter: Payer: Self-pay | Admitting: Cardiovascular Disease

## 2022-04-24 VITALS — BP 126/72 | HR 60 | Ht 68.0 in | Wt 171.4 lb

## 2022-04-24 DIAGNOSIS — I10 Essential (primary) hypertension: Secondary | ICD-10-CM

## 2022-04-24 DIAGNOSIS — I25118 Atherosclerotic heart disease of native coronary artery with other forms of angina pectoris: Secondary | ICD-10-CM

## 2022-04-24 DIAGNOSIS — E785 Hyperlipidemia, unspecified: Secondary | ICD-10-CM | POA: Diagnosis not present

## 2022-04-24 MED ORDER — METOPROLOL TARTRATE 25 MG PO TABS: 25.0000 mg | ORAL_TABLET | Freq: Two times a day (BID) | ORAL | 3 refills | Status: DC

## 2022-04-24 NOTE — Telephone Encounter (Signed)
Pt's medication was already sent to pt's pharmacy today as requested. Confirmation received.  

## 2022-04-24 NOTE — Telephone Encounter (Signed)
Spoke with pt's wife, DPR and advised pt's medication has been sent to pharmacy as requested.  Pt's wife thanked Therapist, sports for the call.

## 2022-04-24 NOTE — Patient Instructions (Signed)
Medication Instructions:  No changes *If you need a refill on your cardiac medications before your next appointment, please call your pharmacy*   Lab Work: none If you have labs (blood work) drawn today and your tests are completely normal, you will receive your results only by: MyChart Message (if you have MyChart) OR A paper copy in the mail If you have any lab test that is abnormal or we need to change your treatment, we will call you to review the results.   Testing/Procedures: none   Follow-Up: At [facility], you and your health needs are our priority.  As part of our continuing mission to provide you with exceptional heart care, we have created designated Provider Care Teams.  These Care Teams include your primary Cardiologist (physician) and Advanced Practice Providers (APPs -  Physician Assistants and Nurse Practitioners) who all work together to provide you with the care you need, when you need it.     Your next appointment:   12 month(s)  Provider:   Christopher McAlhany, MD      

## 2022-04-24 NOTE — Telephone Encounter (Signed)
Pt c/o medication issue:  1. Name of Medication:   metoprolol tartrate (LOPRESSOR) 25 MG tablet   2. How are you currently taking this medication (dosage and times per day)?   Had not been taking this medication  3. Are you having a reaction (difficulty breathing--STAT)?   4. What is your medication issue?    Wife stated patient had not been taking this medication and needs to get this medication re-filled to the [place] for a 90-day supply.

## 2022-04-25 DIAGNOSIS — I129 Hypertensive chronic kidney disease with stage 1 through stage 4 chronic kidney disease, or unspecified chronic kidney disease: Secondary | ICD-10-CM | POA: Diagnosis not present

## 2022-04-25 DIAGNOSIS — I2510 Atherosclerotic heart disease of native coronary artery without angina pectoris: Secondary | ICD-10-CM | POA: Diagnosis not present

## 2022-04-25 DIAGNOSIS — E1122 Type 2 diabetes mellitus with diabetic chronic kidney disease: Secondary | ICD-10-CM | POA: Diagnosis not present

## 2022-04-25 DIAGNOSIS — N183 Chronic kidney disease, stage 3 unspecified: Secondary | ICD-10-CM | POA: Diagnosis not present

## 2022-04-25 DIAGNOSIS — Z794 Long term (current) use of insulin: Secondary | ICD-10-CM | POA: Diagnosis not present

## 2022-04-25 DIAGNOSIS — M6281 Muscle weakness (generalized): Secondary | ICD-10-CM | POA: Diagnosis not present

## 2022-04-25 DIAGNOSIS — I69398 Other sequelae of cerebral infarction: Secondary | ICD-10-CM | POA: Diagnosis not present

## 2022-04-25 DIAGNOSIS — Z7984 Long term (current) use of oral hypoglycemic drugs: Secondary | ICD-10-CM | POA: Diagnosis not present

## 2022-04-25 DIAGNOSIS — M545 Low back pain, unspecified: Secondary | ICD-10-CM | POA: Diagnosis not present

## 2022-04-25 DIAGNOSIS — D509 Iron deficiency anemia, unspecified: Secondary | ICD-10-CM | POA: Diagnosis not present

## 2022-04-27 DIAGNOSIS — E7253 Hyperoxaluria: Secondary | ICD-10-CM | POA: Diagnosis not present

## 2022-04-27 DIAGNOSIS — N200 Calculus of kidney: Secondary | ICD-10-CM | POA: Diagnosis not present

## 2022-05-09 ENCOUNTER — Telehealth: Payer: Self-pay | Admitting: *Deleted

## 2022-05-09 MED ORDER — METOPROLOL TARTRATE 25 MG PO TABS: 25.0000 mg | ORAL_TABLET | Freq: Two times a day (BID) | ORAL | 3 refills | Status: DC

## 2022-05-09 NOTE — Telephone Encounter (Signed)
Received note faxed from [facility] stating pt does not have auth for community care and to send Rxs to [place].  I called the patient.  He waS not able to pick up the Lopressor prescription.  He said he just needs the prescription and will walk it into his doctor at the [place] and then they will fill it.  He asked me to mail it to his home.  Confirmed address.  Printed the prescription and placed in outgoing mail.

## 2022-05-10 MED ORDER — METOPROLOL TARTRATE 25 MG PO TABS: 25.0000 mg | ORAL_TABLET | Freq: Two times a day (BID) | ORAL | 3 refills | Status: DC

## 2022-05-17 DIAGNOSIS — R82993 Hyperuricosuria: Secondary | ICD-10-CM | POA: Diagnosis not present

## 2022-06-02 ENCOUNTER — Telehealth: Payer: Self-pay | Admitting: Cardiovascular Disease

## 2022-06-02 NOTE — Telephone Encounter (Signed)
[facility]. Please follow for new med Metoprolol Tartrate.   Call patient with any questions or concerns.

## 2022-06-09 MED ORDER — METOPROLOL TARTRATE 25 MG PO TABS: 25.0000 mg | ORAL_TABLET | Freq: Two times a day (BID) | ORAL | 3 refills | Status: DC

## 2022-06-09 NOTE — Telephone Encounter (Signed)
New prescription for Lopressor and most [facility]: Charise Carwin, PA-C at [phone]

## 2022-08-03 DIAGNOSIS — Z471 Aftercare following joint replacement surgery: Secondary | ICD-10-CM | POA: Diagnosis not present

## 2022-08-03 DIAGNOSIS — Z96651 Presence of right artificial knee joint: Secondary | ICD-10-CM | POA: Diagnosis not present

## 2022-08-16 DIAGNOSIS — E7800 Pure hypercholesterolemia, unspecified: Secondary | ICD-10-CM | POA: Diagnosis not present

## 2022-08-16 DIAGNOSIS — I252 Old myocardial infarction: Secondary | ICD-10-CM | POA: Diagnosis not present

## 2022-08-16 DIAGNOSIS — D500 Iron deficiency anemia secondary to blood loss (chronic): Secondary | ICD-10-CM | POA: Diagnosis not present

## 2022-08-16 DIAGNOSIS — I2510 Atherosclerotic heart disease of native coronary artery without angina pectoris: Secondary | ICD-10-CM | POA: Diagnosis not present

## 2022-08-16 DIAGNOSIS — E1151 Type 2 diabetes mellitus with diabetic peripheral angiopathy without gangrene: Secondary | ICD-10-CM | POA: Diagnosis not present

## 2022-08-16 DIAGNOSIS — N1831 Chronic kidney disease, stage 3a: Secondary | ICD-10-CM | POA: Diagnosis not present

## 2022-08-16 DIAGNOSIS — E1122 Type 2 diabetes mellitus with diabetic chronic kidney disease: Secondary | ICD-10-CM | POA: Diagnosis not present

## 2022-08-16 DIAGNOSIS — I25119 Atherosclerotic heart disease of native coronary artery with unspecified angina pectoris: Secondary | ICD-10-CM | POA: Diagnosis not present

## 2022-08-16 DIAGNOSIS — E1142 Type 2 diabetes mellitus with diabetic polyneuropathy: Secondary | ICD-10-CM | POA: Diagnosis not present

## 2023-01-10 ENCOUNTER — Telehealth: Payer: Self-pay | Admitting: Cardiovascular Disease

## 2023-01-10 NOTE — Telephone Encounter (Signed)
Pt c/o of Chest Pain: STAT if CP now or developed within 24 hours  1. Are you having CP right now? Discomfort   2. Are you experiencing any other symptoms (ex. SOB, nausea, vomiting, sweating)?    3. How long have you been experiencing CP? Since weekend   4. Is your CP continuous or coming and going? Coming and going   5. Have you taken Nitroglycerin? no ?  Schd appt for 12/9

## 2023-01-10 NOTE — Telephone Encounter (Signed)
Called patient's wife, who told me to call patient. Called patient. Patient stated his discomfort started over the weekend due to stress. 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 Patient stated it comes and goes. Patient has history of CAD with stents. Scheduled patient with Dr. Clifton James tomorrow and informed patient how to take nitroglycerin for chest discomfort and when to go to ED/call 911. Patient verbalized understanding.

## 2023-01-11 ENCOUNTER — Ambulatory Visit: Payer: Medicare Other | Attending: Cardiovascular Disease | Admitting: Cardiovascular Disease

## 2023-01-11 ENCOUNTER — Encounter: Payer: Self-pay | Admitting: Cardiovascular Disease

## 2023-01-11 VITALS — BP 140/80 | HR 70 | Ht 68.0 in | Wt 166.0 lb

## 2023-01-11 DIAGNOSIS — E785 Hyperlipidemia, unspecified: Secondary | ICD-10-CM

## 2023-01-11 DIAGNOSIS — I10 Essential (primary) hypertension: Secondary | ICD-10-CM | POA: Diagnosis not present

## 2023-01-11 DIAGNOSIS — I25118 Atherosclerotic heart disease of native coronary artery with other forms of angina pectoris: Secondary | ICD-10-CM | POA: Diagnosis not present

## 2023-01-11 DIAGNOSIS — R0789 Other chest pain: Secondary | ICD-10-CM

## 2023-01-11 MED ORDER — ISOSORBIDE MONONITRATE ER 60 MG PO TB24: 60.0000 mg | ORAL_TABLET | Freq: Every day | ORAL | 3 refills | Status: DC

## 2023-01-11 NOTE — Patient Instructions (Addendum)
Medication Instructions:  Your physician has recommended you make the following change in your medication:  1.) increase isosorbide (IMDUR) 60 mg - take one tablet twice a day  *If you need a refill on your cardiac medications before your next appointment, please call your pharmacy*   Lab Work: none    Testing/Procedures: none   Follow-Up: At [facility], you and your health needs are our priority.  As part of our continuing mission to provide you with exceptional heart care, we have created designated Provider Care Teams.  These Care Teams include your primary Cardiologist (physician) and Advanced Practice Providers (APPs -  Physician Assistants and Nurse Practitioners) who all work together to provide you with the care you need, when you need it.   Your next appointment:   4 weeks  Provider:   Verne Carrow, MD     Other Instructions

## 2023-01-11 NOTE — Progress Notes (Signed)
'  Chief Complaint  Patient presents with   Follow-up    Chest pain    History of Present Illness: 82 yo male with history of DM, HTN, SVT, PTSD and CAD here today for follow up. He was admitted to [facility] November 2018 with a NSTEMI. Cardiac cath November 2018 with severe stenosis in the mid LAD and mid RCA treated with 3 drug eluting stents in LAD and one drug eluting stent in the mid RCA. The posterolateral branch had a severe stenosis treated with balloon angioplasty. Echo November 2018 with normal LV systolic function. He was placed on ASA and Brilinta but admitted December 2018 with melena and chest pain. EGD showed a non bleeding ulcer and gastritis. He was seen in March 2020 by Jacolyn Reedy, PA and had c/o chest pain when walking in cold weather. Imdur was increased. Nuclear stress test 04/15/18 with no ischemia. He was admitted to [facility] September 2021 with chest pain. Cardiac cath 10/10/19 with patent LAD and RCA stents with moderate stenosis in the small Circumflex and moderate stenosis in the PDA. Also severe disease in an RV marginal branch. Normal LV function. Medical therapy recommended. He has since been on Plavix. I saw him in the office 09/12/21 and he was c/o dizziness with HR in the 50s at home. Norvasc was stopped at that visit due to hypotension. Echo 09/20/21 with LVEF=60-65%. Normal RV function. Mild MR. Cardiac monitor with sinus brady, lowest HR 45 bpm with no high grade AV block. One 4 beat run of SVT. Rare PACs/PVCs. He was admitted to [facility] in February 2024 with altered mental status in the setting of kidney stone, dehydration and opioids. Brain MRI with no acute stroke. Echo 03/21/22 with LVEF=65-70%. No significant valve disease.   He is here today for follow up. He tells me today that he has had chest pain when he is stressed. He has been under much stress. He has PTSD. Chest pain responsive to NTG. He denies any dyspnea, palpitations, lower extremity edema, orthopnea, PND, dizziness,  near syncope or syncope.   Primary Care Physician: Georgann Housekeeper, MD  Past Medical History:  Diagnosis Date   Anticoagulated    plavix   Arthritis    Shoulder, knees, back    CAD in native artery cardiologist-  dr Clifton James   a. CAD/NSTEMI ,  cardiac cath staged stenting-- 12-25-2016  PTCA and DES x3 to prox. and mid LAD;  12-26-2016  PCI to PLA and DES x1 to midRCA,  EF 60-65%.   Chronic lower back pain    CKD (chronic kidney disease), stage III (HCC)    Complication of anesthesia    "Too much with shoulder surgery", pt. reports that he was told the at they "lost him, due to absorbing too much anesthesia".  shoulder surgery 1985   DDD (degenerative disc disease), lumbosacral    GERD (gastroesophageal reflux disease)    History of gastric ulcer 01/2017   History of kidney stones    History of malignant melanoma    right side of nose   History of non-ST elevation myocardial infarction (NSTEMI) 12/23/2017   s/p  staged cardiac cath,  s/p  PCI and DEStenting   Hyperlipidemia    Hypertension    IDA (iron deficiency anemia)    Iron deficiency anemia due to chronic blood loss 10/05/2021   Myocardial infarction (HCC)    x 2   Neuromuscular disorder (HCC)    neuropathy   Nocturia    RBBB  Renal calculus, right    S/P drug eluting coronary stent placement 12-25-2017,  12-26-2017   PTCA and DES x3 to prox. and mid LAD;  PCI to PLA and DES x1 to midRCA   Type 2 diabetes mellitus treated with insulin (HCC)    followed by pcp    Past Surgical History:  Procedure Laterality Date   ANAL FISSURE REPAIR  X 2   BACK SURGERY     CATARACT EXTRACTION W/ INTRAOCULAR LENS  IMPLANT, BILATERAL Bilateral 2017;  2015   COLONOSCOPY WITH PROPOFOL N/A 06/05/2016   Procedure: COLONOSCOPY WITH PROPOFOL;  Surgeon: Charolett Bumpers, MD;  Location: [facility];  Service: Endoscopy;  Laterality: N/A;   CONVERSION TO TOTAL KNEE Right 08/25/2020   Procedure: Revision right knee unicompartmental  arthroplasty to total knee arthroplasty;  Surgeon: Ollen Gross, MD;  Location: [facility];  Service: Orthopedics;  Laterality: Right;   CORONARY ANGIOGRAPHY N/A 12/26/2016   Procedure: CORONARY ANGIOGRAPHY;  Surgeon: Lennette Bihari, MD;  Location: [facility];  Service: Cardiovascular;  Laterality: N/A;   CORONARY STENT INTERVENTION N/A 12/25/2016   Procedure: CORONARY STENT INTERVENTION;  Surgeon: Kathleene Hazel, MD;  Location: [facility];  Service: Cardiovascular;  Laterality: N/A;   CORONARY STENT INTERVENTION N/A 12/26/2016   Procedure: CORONARY STENT INTERVENTION;  Surgeon: Lennette Bihari, MD;  Location: [facility];  Service: Cardiovascular;  Laterality: N/A;   CYSTOSCOPY/URETEROSCOPY/HOLMIUM LASER/STENT PLACEMENT Right 01/07/2018   Procedure: RIGHT URETEROSCOPY/HOLMIUM LASER/STENT PLACEMENT;  Surgeon: Crista Elliot, MD;  Location: [facility];  Service: Urology;  Laterality: Right;   CYSTOSCOPY/URETEROSCOPY/HOLMIUM LASER/STENT PLACEMENT Left 03/22/2022   Procedure: CYSTOSCOPY LEFT URETEROSCOPY/STENT PLACEMENT;  Surgeon: Crista Elliot, MD;  Location: [facility];  Service: Urology;  Laterality: Left;   ESOPHAGOGASTRODUODENOSCOPY (EGD) WITH PROPOFOL N/A 01/12/2017   Procedure: ESOPHAGOGASTRODUODENOSCOPY (EGD) WITH PROPOFOL;  Surgeon: Charlott Rakes, MD;  Location: [facility] ENDOSCOPY;  Service: Endoscopy;  Laterality: N/A;   HAND TENDON SURGERY Right 10-29-2002   dr Mina Marble @[facility]    right index and thumb   KNEE ARTHROSCOPY Bilateral 2009-;2010   @[facility]    LEFT HEART CATH AND CORONARY ANGIOGRAPHY N/A 12/25/2016   Procedure: LEFT HEART CATH AND CORONARY ANGIOGRAPHY;  Surgeon: Kathleene Hazel, MD;  Location: [facility];  Service: Cardiovascular;  Laterality: N/A;   LEFT HEART CATH AND CORONARY ANGIOGRAPHY N/A 10/10/2019   Procedure: LEFT HEART CATH AND CORONARY ANGIOGRAPHY;  Surgeon: Lennette Bihari, MD;  Location: [facility];  Service: Cardiovascular;  Laterality: N/A;   LUMBAR LAMINECTOMY/DECOMPRESSION MICRODISCECTOMY N/A 06/09/2015   Procedure: Laminectomy - T12-L1;  Surgeon: Tia Alert, MD;  Location: [facility];  Service: Neurosurgery;  Laterality: N/A;  Laminectomy - T12-L1   MEDIAL PARTIAL KNEE REPLACEMENT Bilateral 2009-2010    [facility]    MELANOMA EXCISION Right    "side of my nose"   REVERSE SHOULDER ARTHROPLASTY Right 03/31/2021   Procedure: REVERSE SHOULDER ARTHROPLASTY;  Surgeon: Francena Hanly, MD;  Location: [facility];  Service: Orthopedics;  Laterality: Right;    SHOULDER SURGERY Left 1985   TOTAL SHOULDER ARTHROPLASTY Left 12/06/2012   Procedure: LEFT TOTAL SHOULDER ARTHROPLASTY VERSES A REVERSE TOTAL SHOULDER ARTHROPLASTY;  Surgeon: Verlee Rossetti, MD;  Location: [facility];  Service: Orthopedics;  Laterality: Left;    Current Outpatient Medications  Medication Sig Dispense Refill   acetaminophen (TYLENOL) 500 MG tablet Take 1,000 mg by mouth every 6 (six) hours as  needed for mild pain.     amoxicillin (AMOXIL) 500 MG capsule Take 2,000 mg by mouth See admin instructions. Take 2000 mg by mouth 1 hour prior to dental work     clopidogrel (PLAVIX) 75 MG tablet Take 1 tablet (75 mg total) by mouth daily. Take aspirin and Plavix together for 21 days, then stop aspirin and continue taking Plavix indefinitely. 30 tablet 3   Continuous Blood Gluc Receiver (FREESTYLE LIBRE 2 READER) DEVI As directed 1 each 0   Continuous Blood Gluc Sensor (FREESTYLE LIBRE 2 SENSOR) MISC 1 Piece by Does not apply route every 14 (fourteen) days. 2 each 3   empagliflozin (JARDIANCE) 25 MG TABS tablet Take 12.5 mg by mouth daily.     ezetimibe (ZETIA) 10 MG tablet Take 1 tablet (10 mg total) by mouth daily. 90 tablet 3   ferrous gluconate (FERGON) 324 MG tablet Take 324 mg by mouth 3 (three) times a week. Mon, Wed, friday     isosorbide mononitrate (IMDUR) 60 MG 24 hr tablet Take 60 mg by mouth daily.     losartan  (COZAAR) 50 MG tablet Take 1 tablet (50 mg total) by mouth daily. (Patient taking differently: Take 50 mg by mouth at bedtime.) 90 tablet 1   metFORMIN (GLUCOPHAGE) 500 MG tablet Take 500 mg by mouth daily with breakfast.     metoprolol tartrate (LOPRESSOR) 25 MG tablet Take 1 tablet (25 mg total) by mouth 2 (two) times daily. 180 tablet 3   nitroGLYCERIN (NITROSTAT) 0.4 MG SL tablet Place 1 tablet (0.4 mg total) under the tongue every 5 (five) minutes as needed for chest pain. 30 tablet 12   Omega-3 Fatty Acids (FISH OIL) 1000 MG CAPS Take 1,000 mg by mouth daily.     ondansetron (ZOFRAN) 4 MG tablet Take 1 tablet (4 mg total) by mouth every 8 (eight) hours as needed for nausea or vomiting. 10 tablet 0   traMADol (ULTRAM) 50 MG tablet Take 1 tablet (50 mg total) by mouth every 6 (six) hours as needed. 10 tablet 1   Vitamin D, Cholecalciferol, 25 MCG (1000 UT) CAPS Take 1,000 Units by mouth daily.      Zinc 50 MG CAPS Take 50 mg by mouth daily.     pantoprazole (PROTONIX) 40 MG tablet Take 40 mg by mouth daily. (Patient not taking: Reported on 01/11/2023)     rosuvastatin (CRESTOR) 10 MG tablet Take 5 mg by mouth at bedtime. (Patient not taking: Reported on 01/11/2023)     No current facility-administered medications for this visit.    Allergies  Allergen Reactions   Statins Other (See Comments)    Severe stomach pain.   Oxycodone Itching   Hydrocodone Other (See Comments)    "messes with my hearing"   Codeine Itching   Morphine And Codeine Itching    Social History   Socioeconomic History   Marital status: Married    Spouse name: Not on file   Number of children: Not on file   Years of education: Not on file   Highest education level: Not on file  Occupational History   Not on file  Tobacco Use   Smoking status: Never   Smokeless tobacco: Never  Vaping Use   Vaping status: Never Used  Substance and Sexual Activity   Alcohol use: No   Drug use: No   Sexual activity: Not  Currently  Other Topics Concern   Not on file  Social History Narrative   Not  on file   Social Determinants of Health   Financial Resource Strain: Not on file  Food Insecurity: No Food Insecurity (03/20/2022)   Hunger Vital Sign    Worried About Running Out of Food in the Last Year: Never true    Ran Out of Food in the Last Year: Never true  Transportation Needs: No Transportation Needs (03/20/2022)   PRAPARE - Administrator, Civil Service (Medical): No    Lack of Transportation (Non-Medical): No  Physical Activity: Not on file  Stress: Not on file  Social Connections: Not on file  Intimate Partner Violence: Not At Risk (03/20/2022)   Humiliation, Afraid, Rape, and Kick questionnaire    Fear of Current or Ex-Partner: No    Emotionally Abused: No    Physically Abused: No    Sexually Abused: No    Family History  Problem Relation Age of Onset   CAD Father    Heart failure Father    CAD Sister    Stroke Mother    Heart failure Mother     Review of Systems:  As stated in the HPI and otherwise negative.   BP (!) 140/80   Pulse 70   Ht 5\' 8"  (1.727 m)   Wt 75.3 kg   SpO2 98%   BMI 25.24 kg/m   Physical Examination: General: Well developed, well nourished, NAD  HEENT: OP clear, mucus membranes moist  SKIN: warm, dry. No rashes. Neuro: No focal deficits  Musculoskeletal: Muscle strength 5/5 all ext  Psychiatric: Mood and affect normal  Neck: No JVD, no carotid bruits, no thyromegaly, no lymphadenopathy.  Lungs:Clear bilaterally, no wheezes, rhonci, crackles Cardiovascular: Regular rate and rhythm. No murmurs, gallops or rubs. Abdomen:Soft. Bowel sounds present. Non-tender.  Extremities: No lower extremity edema. Pulses are 2 + in the bilateral DP/PT.  Echo February 2024:  1. Left ventricular ejection fraction, by estimation, is 65 to 70%. The  left ventricle has normal function. The left ventricle has no regional  wall motion abnormalities. There is  mild concentric left ventricular  hypertrophy. Left ventricular diastolic  parameters are consistent with Grade I diastolic dysfunction (impaired  relaxation).   2. Right ventricular systolic function is normal. The right ventricular  size is normal. There is normal pulmonary artery systolic pressure.   3. Left atrial size was mildly dilated.   4. The mitral valve is grossly normal. Trivial mitral valve  regurgitation. No evidence of mitral stenosis.   5. The aortic valve is tricuspid. There is mild calcification of the  aortic valve. There is moderate thickening of the aortic valve. Aortic  valve regurgitation is not visualized. Aortic valve  sclerosis/calcification is present, without any evidence of  aortic stenosis.   6. Aortic dilatation noted. There is borderline dilatation of the  ascending aorta, measuring 38 mm.   7. The inferior vena cava is normal in size with greater than 50%   EKG:  EKG is ordered today The ekg ordered today demonstrates  EKG Interpretation Date/Time:  Thursday January 11 2023 16:37:39 EST Ventricular Rate:  70 PR Interval:  130 QRS Duration:  132 QT Interval:  430 QTC Calculation: 464 R Axis:   -6  Text Interpretation: Normal sinus rhythm Right bundle branch block Confirmed by Verne Carrow [phone]) on 01/11/2023 4:41:04 PM    Recent Labs: 03/21/2022: ALT 12; Hemoglobin 13.1; Platelets 109 03/22/2022: BUN 27; Creatinine, Ser 2.03; Potassium 3.8; Sodium 139   Lipid Panel    Component Value Date/Time  CHOL 132 03/21/2022 0310   CHOL 182 11/11/2020 0855   TRIG 142 03/21/2022 0310   HDL 34 (L) 03/21/2022 0310   HDL 40 11/11/2020 0855   CHOLHDL 3.9 03/21/2022 0310   VLDL 28 03/21/2022 0310   LDLCALC 70 03/21/2022 0310   LDLCALC 117 (H) 11/11/2020 0855     Wt Readings from Last 3 Encounters:  01/11/23 75.3 kg  04/24/22 77.7 kg  03/22/22 79.4 kg    Assessment and Plan:   1. CAD with angina: Chest pain when under stress. Responsive  to NTG. Drug eluting stents placed in the LAD and RCA in November 2018. CAD stable by cath in September 2021. He has normal LV systolic function by echo in February 2024. Continue Plavix, statin, Zetia, beta blocker and Imdur.  Will increase Imdur to 60 mg po BID  2. Hyperlipidemia: Lipids followed at the [place]. LDL at goal in February 2024. Continue statin and Zetia.    3. HTN: BP is controlled. No changes today  Labs/ tests ordered today include:   Orders Placed This Encounter  Procedures   EKG 12-Lead   Disposition:   F/U with me in 12 months  Signed, Verne Carrow, MD 01/11/2023 4:48 PM    [facility] [address] [place], [place], [place]  [address] Phone: [phone]; Fax: [phone]

## 2023-01-15 ENCOUNTER — Ambulatory Visit: Payer: Medicare Other | Admitting: Cardiology

## 2023-02-14 DIAGNOSIS — E7800 Pure hypercholesterolemia, unspecified: Secondary | ICD-10-CM | POA: Diagnosis not present

## 2023-02-14 DIAGNOSIS — I252 Old myocardial infarction: Secondary | ICD-10-CM | POA: Diagnosis not present

## 2023-02-14 DIAGNOSIS — E1142 Type 2 diabetes mellitus with diabetic polyneuropathy: Secondary | ICD-10-CM | POA: Diagnosis not present

## 2023-02-14 DIAGNOSIS — D500 Iron deficiency anemia secondary to blood loss (chronic): Secondary | ICD-10-CM | POA: Diagnosis not present

## 2023-02-14 DIAGNOSIS — Z0000 Encounter for general adult medical examination without abnormal findings: Secondary | ICD-10-CM | POA: Diagnosis not present

## 2023-02-14 DIAGNOSIS — I10 Essential (primary) hypertension: Secondary | ICD-10-CM | POA: Diagnosis not present

## 2023-02-14 DIAGNOSIS — I739 Peripheral vascular disease, unspecified: Secondary | ICD-10-CM | POA: Diagnosis not present

## 2023-02-14 DIAGNOSIS — N1831 Chronic kidney disease, stage 3a: Secondary | ICD-10-CM | POA: Diagnosis not present

## 2023-02-14 DIAGNOSIS — E1122 Type 2 diabetes mellitus with diabetic chronic kidney disease: Secondary | ICD-10-CM | POA: Diagnosis not present

## 2023-02-14 DIAGNOSIS — I25119 Atherosclerotic heart disease of native coronary artery with unspecified angina pectoris: Secondary | ICD-10-CM | POA: Diagnosis not present

## 2023-02-14 DIAGNOSIS — E1151 Type 2 diabetes mellitus with diabetic peripheral angiopathy without gangrene: Secondary | ICD-10-CM | POA: Diagnosis not present

## 2023-02-19 NOTE — Progress Notes (Signed)
 '  Chief Complaint  Patient presents with   Follow-up    CAD   History of Present Illness: 83 yo male with history of DM, HTN, SVT, PTSD and CAD here today for follow up. He was admitted to [facility] November 2018 with a NSTEMI. Cardiac cath November 2018 with severe stenosis in the mid LAD and mid RCA treated with 3 drug eluting stents in LAD and one drug eluting stent in the mid RCA. The posterolateral branch had a severe stenosis treated with balloon angioplasty. Echo November 2018 with normal LV systolic function. He was placed on ASA and Brilinta  but admitted December 2018 with melena and chest pain. EGD showed a non bleeding ulcer and gastritis. He was seen in March 2020 by Olivia Pavy, PA and had c/o chest pain when walking in cold weather. Imdur  was increased. Nuclear stress test 04/15/18 with no ischemia. He was admitted to [facility] September 2021 with chest pain. Cardiac cath 10/10/19 with patent LAD and RCA stents with moderate stenosis in the small Circumflex and moderate stenosis in the PDA. Also severe disease in an RV marginal branch. Normal LV function. Medical therapy recommended. He has since been on Plavix . I saw him in the office 09/12/21 and he was c/o dizziness with HR in the 50s at home. Norvasc  was stopped at that visit due to hypotension. Echo 09/20/21 with LVEF=60-65%. Normal RV function. Mild MR. Cardiac monitor with sinus brady, lowest HR 45 bpm with no high grade AV block. One 4 beat run of SVT. Rare PACs/PVCs. He was admitted to [facility] in February 2024 with altered mental status in the setting of kidney stone, dehydration and opioids. Brain MRI with no acute stroke. Echo 03/21/22 with LVEF=65-70%. No significant valve disease. I saw him in the office 01/11/23 and he c/o chest pain when under stressful situations. Imdur  increased to 60 mg po BID.   He is here today for follow up. The patient denies any chest pain, dyspnea, palpitations, lower extremity edema, orthopnea, PND, dizziness, near  syncope or syncope. He is feeling better on the higher dose of Imdur  with no exertional chest pains.   Primary Care Physician: Ransom Other, MD  Past Medical History:  Diagnosis Date   Anticoagulated    plavix    Arthritis    Shoulder, knees, back    CAD in native artery cardiologist-  dr verlin   a. CAD/NSTEMI ,  cardiac cath staged stenting-- 12-25-2016  PTCA and DES x3 to prox. and mid LAD;  12-26-2016  PCI to PLA and DES x1 to midRCA,  EF 60-65%.   Chronic lower back pain    CKD (chronic kidney disease), stage III (HCC)    Complication of anesthesia    Too much with shoulder surgery, pt. reports that he was told the at they lost him, due to absorbing too much anesthesia.  shoulder surgery 1985   DDD (degenerative disc disease), lumbosacral    GERD (gastroesophageal reflux disease)    History of gastric ulcer 01/2017   History of kidney stones    History of malignant melanoma    right side of nose   History of non-ST elevation myocardial infarction (NSTEMI) 12/23/2017   s/p  staged cardiac cath,  s/p  PCI and DEStenting   Hyperlipidemia    Hypertension    IDA (iron deficiency anemia)    Iron deficiency anemia due to chronic blood loss 10/05/2021   Myocardial infarction (HCC)    x 2   Neuromuscular disorder (HCC)  neuropathy   Nocturia    RBBB    Renal calculus, right    S/P drug eluting coronary stent placement 12-25-2017,  12-26-2017   PTCA and DES x3 to prox. and mid LAD;  PCI to PLA and DES x1 to midRCA   Type 2 diabetes mellitus treated with insulin  (HCC)    followed by pcp    Past Surgical History:  Procedure Laterality Date   ANAL FISSURE REPAIR  X 2   BACK SURGERY     CATARACT EXTRACTION W/ INTRAOCULAR LENS  IMPLANT, BILATERAL Bilateral 2017;  2015   COLONOSCOPY WITH PROPOFOL  N/A 06/05/2016   Procedure: COLONOSCOPY WITH PROPOFOL ;  Surgeon: Gladis MARLA Louder, MD;  Location: [facility];  Service: Endoscopy;  Laterality: N/A;   CONVERSION TO TOTAL KNEE  Right 08/25/2020   Procedure: Revision right knee unicompartmental arthroplasty to total knee arthroplasty;  Surgeon: Melodi Lerner, MD;  Location: [facility];  Service: Orthopedics;  Laterality: Right;   CORONARY ANGIOGRAPHY N/A 12/26/2016   Procedure: CORONARY ANGIOGRAPHY;  Surgeon: Burnard Debby LABOR, MD;  Location: [facility];  Service: Cardiovascular;  Laterality: N/A;   CORONARY STENT INTERVENTION N/A 12/25/2016   Procedure: CORONARY STENT INTERVENTION;  Surgeon: Verlin Lonni BIRCH, MD;  Location: [facility];  Service: Cardiovascular;  Laterality: N/A;   CORONARY STENT INTERVENTION N/A 12/26/2016   Procedure: CORONARY STENT INTERVENTION;  Surgeon: Burnard Debby LABOR, MD;  Location: [facility];  Service: Cardiovascular;  Laterality: N/A;   CYSTOSCOPY/URETEROSCOPY/HOLMIUM LASER/STENT PLACEMENT Right 01/07/2018   Procedure: RIGHT URETEROSCOPY/HOLMIUM LASER/STENT PLACEMENT;  Surgeon: Carolee Sherwood BIRCH DOUGLAS, MD;  Location: [facility];  Service: Urology;  Laterality: Right;   CYSTOSCOPY/URETEROSCOPY/HOLMIUM LASER/STENT PLACEMENT Left 03/22/2022   Procedure: CYSTOSCOPY LEFT URETEROSCOPY/STENT PLACEMENT;  Surgeon: Carolee Sherwood BIRCH DOUGLAS, MD;  Location: [facility];  Service: Urology;  Laterality: Left;   ESOPHAGOGASTRODUODENOSCOPY (EGD) WITH PROPOFOL  N/A 01/12/2017   Procedure: ESOPHAGOGASTRODUODENOSCOPY (EGD) WITH PROPOFOL ;  Surgeon: Dianna Specking, MD;  Location: [facility] ENDOSCOPY;  Service: Endoscopy;  Laterality: N/A;   HAND TENDON SURGERY Right 10-29-2002   dr sissy @[facility]    right index and thumb   KNEE ARTHROSCOPY Bilateral 2009-;2010   @[facility]    LEFT HEART CATH AND CORONARY ANGIOGRAPHY N/A 12/25/2016   Procedure: LEFT HEART CATH AND CORONARY ANGIOGRAPHY;  Surgeon: Verlin Lonni BIRCH, MD;  [facility]: [facility];  Service: Cardiovascular;  Laterality: N/A;   LEFT HEART CATH AND CORONARY ANGIOGRAPHY N/A 10/10/2019   Procedure: LEFT HEART CATH AND CORONARY  ANGIOGRAPHY;  Surgeon: Burnard Debby LABOR, MD;  Location: [facility];  Service: Cardiovascular;  Laterality: N/A;   LUMBAR LAMINECTOMY/DECOMPRESSION MICRODISCECTOMY N/A 06/09/2015   Procedure: Laminectomy - T12-L1;  Surgeon: Alm GORMAN Molt, MD;  Location: [facility]O [facility];  Service: Neurosurgery;  Laterality: N/A;  Laminectomy - T12-L1   MEDIAL PARTIAL KNEE REPLACEMENT Bilateral 2009-2010    [facility]    MELANOMA EXCISION Right    side of my nose   REVERSE SHOULDER ARTHROPLASTY Right 03/31/2021   Procedure: REVERSE SHOULDER ARTHROPLASTY;  Surgeon: Melita Drivers, MD;  Location: [facility];  Service: Orthopedics;  Laterality: Right;    SHOULDER SURGERY Left 1985   TOTAL SHOULDER ARTHROPLASTY Left 12/06/2012   Procedure: LEFT TOTAL SHOULDER ARTHROPLASTY VERSES A REVERSE TOTAL SHOULDER ARTHROPLASTY;  Surgeon: Elspeth JONELLE Her, MD;  Location: [facility];  Service: Orthopedics;  Laterality: Left;    Current Outpatient Medications  Medication Sig Dispense Refill   acetaminophen  (TYLENOL ) 500 MG  tablet Take 1,000 mg by mouth every 6 (six) hours as needed for mild pain.     amLODipine  (NORVASC ) 10 MG tablet Take 10 mg by mouth daily.     carvedilol (COREG) 6.25 MG tablet Take 6.25 mg by mouth 2 (two) times daily with a meal.     clopidogrel  (PLAVIX ) 75 MG tablet Take 1 tablet (75 mg total) by mouth daily. Take aspirin  and Plavix  together for 21 days, then stop aspirin  and continue taking Plavix  indefinitely. 30 tablet 3   Continuous Blood Gluc Receiver (FREESTYLE LIBRE 2 READER) DEVI As directed 1 each 0   Continuous Blood Gluc Sensor (FREESTYLE LIBRE 2 SENSOR) MISC 1 Piece by Does not apply route every 14 (fourteen) days. 2 each 3   empagliflozin  (JARDIANCE ) 25 MG TABS tablet Take 12.5 mg by mouth daily.     ezetimibe  (ZETIA ) 10 MG tablet Take 1 tablet (10 mg total) by mouth daily. 90 tablet 3   famotidine (PEPCID) 20 MG tablet Take 20 mg by mouth daily.     ferrous gluconate (FERGON) 324 MG tablet Take  324 mg by mouth 3 (three) times a week. Mon, Wed, friday     isosorbide  mononitrate (IMDUR ) 60 MG 24 hr tablet Take 1 tablet (60 mg total) by mouth daily. 180 tablet 3   losartan  (COZAAR ) 50 MG tablet Take 1 tablet (50 mg total) by mouth daily. (Patient taking differently: Take 50 mg by mouth at bedtime.) 90 tablet 1   metFORMIN  (GLUCOPHAGE ) 500 MG tablet Take 500 mg by mouth daily with breakfast.     nitroGLYCERIN  (NITROSTAT ) 0.4 MG SL tablet Place 1 tablet (0.4 mg total) under the tongue every 5 (five) minutes as needed for chest pain. 30 tablet 12   potassium citrate (UROCIT-K) 10 MEQ (1080 MG) SR tablet Take 15 mEq by mouth 3 (three) times daily with meals.     sertraline (ZOLOFT) 50 MG tablet Take 50 mg by mouth daily.     Vitamin D, Cholecalciferol, 25 MCG (1000 UT) CAPS Take 1,000 Units by mouth daily.      Zinc 50 MG CAPS Take 50 mg by mouth daily.     amoxicillin (AMOXIL) 500 MG capsule Take 2,000 mg by mouth See admin instructions. Take 2000 mg by mouth 1 hour prior to dental work (Patient not taking: Reported on 02/20/2023)     Omega-3 Fatty Acids (FISH OIL) 1000 MG CAPS Take 1,000 mg by mouth daily. (Patient not taking: Reported on 02/20/2023)     ondansetron  (ZOFRAN ) 4 MG tablet Take 1 tablet (4 mg total) by mouth every 8 (eight) hours as needed for nausea or vomiting. (Patient not taking: Reported on 02/20/2023) 10 tablet 0   pantoprazole  (PROTONIX ) 40 MG tablet Take 40 mg by mouth daily. (Patient not taking: Reported on 01/11/2023)     rosuvastatin  (CRESTOR ) 10 MG tablet Take 5 mg by mouth at bedtime. (Patient not taking: Reported on 01/11/2023)     traMADol  (ULTRAM ) 50 MG tablet Take 1 tablet (50 mg total) by mouth every 6 (six) hours as needed. (Patient not taking: Reported on 02/20/2023) 10 tablet 1   No current facility-administered medications for this visit.    Allergies  Allergen Reactions   Statins Other (See Comments)    Severe stomach pain.   Oxycodone  Itching   Hydrocodone   Other (See Comments)    messes with my hearing   Codeine Itching   Morphine  And Codeine Itching    Social History  Socioeconomic History   Marital status: Married    Spouse name: Not on file   Number of children: Not on file   Years of education: Not on file   Highest education level: Not on file  Occupational History   Not on file  Tobacco Use   Smoking status: Never   Smokeless tobacco: Never  Vaping Use   Vaping status: Never Used  Substance and Sexual Activity   Alcohol use: No   Drug use: No   Sexual activity: Not Currently  Other Topics Concern   Not on file  Social History Narrative   Not on file   Social Drivers of Health   Financial Resource Strain: Not on file  Food Insecurity: No Food Insecurity (03/20/2022)   Hunger Vital Sign    Worried About Running Out of Food in the Last Year: Never true    Ran Out of Food in the Last Year: Never true  Transportation Needs: No Transportation Needs (03/20/2022)   PRAPARE - Administrator, Civil Service (Medical): No    Lack of Transportation (Non-Medical): No  Physical Activity: Not on file  Stress: Not on file  Social Connections: Not on file  Intimate Partner Violence: Not At Risk (03/20/2022)   Humiliation, Afraid, Rape, and Kick questionnaire    Fear of Current or Ex-Partner: No    Emotionally Abused: No    Physically Abused: No    Sexually Abused: No    Family History  Problem Relation Age of Onset   CAD Father    Heart failure Father    CAD Sister    Stroke Mother    Heart failure Mother     Review of Systems:  As stated in the HPI and otherwise negative.   BP 110/62   Pulse 62   Ht 5' 8 (1.727 m)   Wt 76.5 kg   SpO2 97%   BMI 25.64 kg/m   Physical Examination: General: Well developed, well nourished, NAD  HEENT: OP clear, mucus membranes moist  SKIN: warm, dry. No rashes. Neuro: No focal deficits  Musculoskeletal: Muscle strength 5/5 all ext  Psychiatric: Mood and affect  normal  Neck: No JVD, no carotid bruits, no thyromegaly, no lymphadenopathy.  Lungs:Clear bilaterally, no wheezes, rhonci, crackles Cardiovascular: Regular rate and rhythm. No murmurs, gallops or rubs. Abdomen:Soft. Bowel sounds present. Non-tender.  Extremities: No lower extremity edema. Pulses are 2 + in the bilateral DP/PT.  Echo February 2024:  1. Left ventricular ejection fraction, by estimation, is 65 to 70%. The  left ventricle has normal function. The left ventricle has no regional  wall motion abnormalities. There is mild concentric left ventricular  hypertrophy. Left ventricular diastolic  parameters are consistent with Grade I diastolic dysfunction (impaired  relaxation).   2. Right ventricular systolic function is normal. The right ventricular  size is normal. There is normal pulmonary artery systolic pressure.   3. Left atrial size was mildly dilated.   4. The mitral valve is grossly normal. Trivial mitral valve  regurgitation. No evidence of mitral stenosis.   5. The aortic valve is tricuspid. There is mild calcification of the  aortic valve. There is moderate thickening of the aortic valve. Aortic  valve regurgitation is not visualized. Aortic valve  sclerosis/calcification is present, without any evidence of  aortic stenosis.   6. Aortic dilatation noted. There is borderline dilatation of the  ascending aorta, measuring 38 mm.   7. The inferior vena cava is normal in  size with greater than 50%   EKG:  EKG is not ordered today The ekg ordered today demonstrates   Recent Labs: 03/21/2022: ALT 12; Hemoglobin 13.1; Platelets 109 03/22/2022: BUN 27; Creatinine, Ser 2.03; Potassium 3.8; Sodium 139   Lipid Panel    Component Value Date/Time   CHOL 132 03/21/2022 0310   CHOL 182 11/11/2020 0855   TRIG 142 03/21/2022 0310   HDL 34 (L) 03/21/2022 0310   HDL 40 11/11/2020 0855   CHOLHDL 3.9 03/21/2022 0310   VLDL 28 03/21/2022 0310   LDLCALC 70 03/21/2022 0310    LDLCALC 117 (H) 11/11/2020 0855     Wt Readings from Last 3 Encounters:  02/20/23 76.5 kg  01/11/23 75.3 kg  04/24/22 77.7 kg    Assessment and Plan:   1. CAD with angina: Drug eluting stents placed in the LAD and RCA in November 2018. CAD stable by cath in September 2021. He has normal LV systolic function by echo in February 2024. He has had resolution of chest pain on higher dose of Imdur  (60 mg po BID). Continue Plavix , beta blocker, statin, Zetia  and Imdur .   2. Hyperlipidemia: Lipids followed at the [facility]. LDL at goal in February 2024. Continue statin and Zetia   3. HTN: BP is well controlled. No changes today  Labs/ tests ordered today include:  No orders of the defined types were placed in this encounter.  Disposition:   F/U with me in 12 months  Signed, Lonni Cash, MD 02/20/2023 11:31 AM    [facility] [address] [place], [place], [place]  [address] Phone: [phone]; Fax: [phone]

## 2023-02-20 ENCOUNTER — Ambulatory Visit: Payer: Medicare Other | Attending: Cardiovascular Disease | Admitting: Cardiovascular Disease

## 2023-02-20 ENCOUNTER — Encounter: Payer: Self-pay | Admitting: Cardiovascular Disease

## 2023-02-20 VITALS — BP 110/62 | HR 62 | Ht 68.0 in | Wt 168.6 lb

## 2023-02-20 DIAGNOSIS — E785 Hyperlipidemia, unspecified: Secondary | ICD-10-CM | POA: Diagnosis not present

## 2023-02-20 DIAGNOSIS — I25118 Atherosclerotic heart disease of native coronary artery with other forms of angina pectoris: Secondary | ICD-10-CM | POA: Diagnosis not present

## 2023-02-20 DIAGNOSIS — I10 Essential (primary) hypertension: Secondary | ICD-10-CM

## 2023-02-20 MED ORDER — ISOSORBIDE MONONITRATE ER 60 MG PO TB24: 60.0000 mg | ORAL_TABLET | Freq: Every day | ORAL | 3 refills | Status: AC

## 2023-02-20 NOTE — Patient Instructions (Signed)
 Medication Instructions:  Stay on the isosorbide  60 mg - one tablet twice a day   *If you need a refill on your cardiac medications before your next appointment, please call your pharmacy*   Lab Work: none   Testing/Procedures: none   Follow-Up: At [facility], you and your health needs are our priority.  As part of our continuing mission to provide you with exceptional heart care, we have created designated Provider Care Teams.  These Care Teams include your primary Cardiologist (physician) and Advanced Practice Providers (APPs -  Physician Assistants and Nurse Practitioners) who all work together to provide you with the care you need, when you need it.   Your next appointment:   6 month(s)  Provider:   Lonni Cash, MD

## 2023-04-23 ENCOUNTER — Ambulatory Visit: Payer: Medicare Other | Admitting: Cardiovascular Disease

## 2023-05-14 DIAGNOSIS — M25552 Pain in left hip: Secondary | ICD-10-CM | POA: Diagnosis not present

## 2023-05-31 DIAGNOSIS — M25552 Pain in left hip: Secondary | ICD-10-CM | POA: Diagnosis not present

## 2023-06-05 DIAGNOSIS — I10 Essential (primary) hypertension: Secondary | ICD-10-CM | POA: Diagnosis not present

## 2023-06-05 DIAGNOSIS — S72142A Displaced intertrochanteric fracture of left femur, initial encounter for closed fracture: Secondary | ICD-10-CM | POA: Diagnosis not present

## 2023-06-06 DIAGNOSIS — E782 Mixed hyperlipidemia: Secondary | ICD-10-CM | POA: Diagnosis not present

## 2023-06-06 DIAGNOSIS — I25810 Atherosclerosis of coronary artery bypass graft(s) without angina pectoris: Secondary | ICD-10-CM | POA: Diagnosis not present

## 2023-06-06 DIAGNOSIS — E1169 Type 2 diabetes mellitus with other specified complication: Secondary | ICD-10-CM | POA: Diagnosis not present

## 2023-06-06 DIAGNOSIS — I10 Essential (primary) hypertension: Secondary | ICD-10-CM | POA: Diagnosis not present

## 2023-06-06 DIAGNOSIS — S72142A Displaced intertrochanteric fracture of left femur, initial encounter for closed fracture: Secondary | ICD-10-CM | POA: Diagnosis not present

## 2023-06-19 ENCOUNTER — Ambulatory Visit: Attending: Physician Assistant | Admitting: Physical Therapy

## 2023-06-19 ENCOUNTER — Encounter: Payer: Self-pay | Admitting: Hematology

## 2023-06-19 ENCOUNTER — Encounter: Payer: Self-pay | Admitting: Physical Therapy

## 2023-06-19 DIAGNOSIS — M25552 Pain in left hip: Secondary | ICD-10-CM | POA: Insufficient documentation

## 2023-06-19 DIAGNOSIS — M6281 Muscle weakness (generalized): Secondary | ICD-10-CM | POA: Insufficient documentation

## 2023-06-19 NOTE — Therapy (Signed)
 OUTPATIENT PHYSICAL THERAPY LOWER EXTREMITY EVALUATION   Patient Name: Matthew [name] MRN: [id number] DOB:[birth year], 83 y.o., male Today's Date: 06/19/2023  END OF SESSION:  PT End of Session - 06/19/23 1110     Visit Number 1    Number of Visits 15    Date for PT Re-Evaluation 08/14/23    PT Start Time 0930    PT Stop Time 1016    PT Time Calculation (min) 46 min    Activity Tolerance Patient tolerated treatment well    Behavior During Therapy [facility] for tasks assessed/performed             Past Medical History:  Diagnosis Date   Anticoagulated    plavix    Arthritis    Shoulder, knees, back    CAD in native artery cardiologist-  dr Abel Hoe   a. CAD/NSTEMI ,  cardiac cath staged stenting-- 12-25-2016  PTCA and DES x3 to prox. and mid LAD;  12-26-2016  PCI to PLA and DES x1 to midRCA,  EF 60-65%.   Chronic lower back pain    CKD (chronic kidney disease), stage III (HCC)    Complication of anesthesia    "Too much with shoulder surgery", pt. reports that he was told the at they "lost him, due to absorbing too much anesthesia".  shoulder surgery 1985   DDD (degenerative disc disease), lumbosacral    GERD (gastroesophageal reflux disease)    History of gastric ulcer 01/2017   History of kidney stones    History of malignant melanoma    right side of nose   History of non-ST elevation myocardial infarction (NSTEMI) 12/23/2017   s/p  staged cardiac cath,  s/p  PCI and DEStenting   Hyperlipidemia    Hypertension    IDA (iron deficiency anemia)    Iron deficiency anemia due to chronic blood loss 10/05/2021   Myocardial infarction (HCC)    x 2   Neuromuscular disorder (HCC)    neuropathy   Nocturia    RBBB    Renal calculus, right    S/P drug eluting coronary stent placement 12-25-2017,  12-26-2017   PTCA and DES x3 to prox. and mid LAD;  PCI to PLA and DES x1 to midRCA   Type 2 diabetes mellitus treated with insulin  (HCC)    followed by pcp   Past Surgical History:   Procedure Laterality Date   ANAL FISSURE REPAIR  X 2   BACK SURGERY     CATARACT EXTRACTION W/ INTRAOCULAR LENS  IMPLANT, BILATERAL Bilateral 2017;  2015   COLONOSCOPY WITH PROPOFOL  N/A 06/05/2016   Procedure: COLONOSCOPY WITH PROPOFOL ;  Surgeon: Garrett Kallman, MD;  Location: [facility];  Service: Endoscopy;  Laterality: N/A;   CONVERSION TO TOTAL KNEE Right 08/25/2020   Procedure: Revision right knee unicompartmental arthroplasty to total knee arthroplasty;  Surgeon: Liliane Rei, MD;  Location: [facility];  Service: Orthopedics;  Laterality: Right;   CORONARY ANGIOGRAPHY N/A 12/26/2016   Procedure: CORONARY ANGIOGRAPHY;  Surgeon: Millicent Ally, MD;  [facility];  Service: Cardiovascular;  Laterality: N/A;   CORONARY STENT INTERVENTION N/A 12/25/2016   Procedure: CORONARY STENT INTERVENTION;  Surgeon: Odie Benne, MD;  [facility];  Service: Cardiovascular;  Laterality: N/A;   CORONARY STENT INTERVENTION N/A 12/26/2016   Procedure: CORONARY STENT INTERVENTION;  Surgeon: Millicent Ally, MD;  [facility];  Service: Cardiovascular;  Laterality: N/A;   CYSTOSCOPY/URETEROSCOPY/HOLMIUM LASER/STENT PLACEMENT Right 01/07/2018  Procedure: RIGHT URETEROSCOPY/HOLMIUM LASER/STENT PLACEMENT;  Surgeon: Samson Croak, MD;  Location: [facility];  Service: Urology;  Laterality: Right;   CYSTOSCOPY/URETEROSCOPY/HOLMIUM LASER/STENT PLACEMENT Left 03/22/2022   Procedure: CYSTOSCOPY LEFT URETEROSCOPY/STENT PLACEMENT;  Surgeon: Samson Croak, MD;  Location: [facility];  Service: Urology;  Laterality: Left;   ESOPHAGOGASTRODUODENOSCOPY (EGD) WITH PROPOFOL  N/A 01/12/2017   Procedure: ESOPHAGOGASTRODUODENOSCOPY (EGD) WITH PROPOFOL ;  Surgeon: Baldo Bonds, MD;  Location: [facility];  Service: Endoscopy;  Laterality: N/A;   HAND TENDON SURGERY Right 10-29-2002   dr Donzella Galley @[facility]    right index and thumb   KNEE ARTHROSCOPY  Bilateral 2009-;2010   @[facility]    LEFT HEART CATH AND CORONARY ANGIOGRAPHY N/A 12/25/2016   Procedure: LEFT HEART CATH AND CORONARY ANGIOGRAPHY;  Surgeon: Odie Benne, MD;  [facility];  Service: Cardiovascular;  Laterality: N/A;   LEFT HEART CATH AND CORONARY ANGIOGRAPHY N/A 10/10/2019   Procedure: LEFT HEART CATH AND CORONARY ANGIOGRAPHY;  Surgeon: Millicent Ally, MD;  [facility];  Service: Cardiovascular;  Laterality: N/A;   LUMBAR LAMINECTOMY/DECOMPRESSION MICRODISCECTOMY N/A 06/09/2015   Procedure: Laminectomy - T12-L1;  Surgeon: Isadora Mar, MD;  [facility];  Service: Neurosurgery;  Laterality: N/A;  Laminectomy - T12-L1   MEDIAL PARTIAL KNEE REPLACEMENT Bilateral 2009-2010    [facility]    MELANOMA EXCISION Right    "side of my nose"   REVERSE SHOULDER ARTHROPLASTY Right 03/31/2021   Procedure: REVERSE SHOULDER ARTHROPLASTY;  Surgeon: Ellard Gunning, MD;  Location: [facility];  Service: Orthopedics;  Laterality: Right;    SHOULDER SURGERY Left 1985   TOTAL SHOULDER ARTHROPLASTY Left 12/06/2012   Procedure: LEFT TOTAL SHOULDER ARTHROPLASTY VERSES A REVERSE TOTAL SHOULDER ARTHROPLASTY;  Surgeon: Lorriane Rote, MD;  [facility];  Service: Orthopedics;  Laterality: Left;   Patient Active Problem List   Diagnosis Date Noted   Acute CVA (cerebrovascular accident) (HCC) 03/20/2022   Normocytic anemia 10/05/2021   Iron deficiency anemia due to chronic blood loss 10/05/2021   Pain due to unicompartmental arthroplasty of knee (HCC) 08/25/2020   Benign prostatic hyperplasia 06/22/2020   Chronic kidney disease, stage 3 unspecified (HCC) 06/22/2020   Coronary artery disease 06/22/2020   Gastro-esophageal reflux disease without esophagitis 06/22/2020   Kidney stone 06/22/2020   Long term current use of insulin  (HCC) 06/22/2020   Polyneuropathy due to type 2 diabetes mellitus (HCC) 06/22/2020   Peripheral arterial disease (HCC)  11/19/2019   RBBB 01/16/2017   PUD (peptic ulcer disease) 01/16/2017   Melena 01/12/2017   Unstable angina (HCC) 01/11/2017   CAD S/P percutaneous coronary angioplasty 01/09/2017   Mixed hyperlipidemia 01/09/2017   DM type 2 causing vascular disease (HCC) 12/24/2016   Essential hypertension, benign 12/24/2016   Chest pain, rule out acute myocardial infarction 12/24/2016   ACS (acute coronary syndrome) (HCC)    History of NSTEMI    Dysfunction of right eustachian tube 02/10/2016   Myelopathy (HCC) 06/09/2015    REFERRING PROVIDER: Audery Lean PA  REFERRING DIAG: Left hip ORIF  THERAPY DIAG:  Pain in left hip  Muscle weakness (generalized)  Rationale for Evaluation and Treatment: Rehabilitation  ONSET DATE: 06/05/23  SUBJECTIVE:   SUBJECTIVE STATEMENT: The patient presents to the clinic s/p left hip ORIF performed about 2 weeks ago. He is doing well and walking safely with a FWW and is FWWing over his left LE.  The surgery was in [facility].  He has a  visit with his Orthopedist this week in [place].  He pain-level is a 4/10.  He has been doing his HEP.  He is pleased with his progress thus far.  Aquacel intact.  PERTINENT HISTORY: Please see above.  Bilateral TKA's.   PAIN:  Are you having pain? Yes: NPRS scale: 4/10. Pain location: Left hip. Pain description: Sore. Aggravating factors: Rest. Relieving factors: Moving.  PRECAUTIONS: Fall.  Patient needs to use FWW at all times.    RED FLAGS: None   WEIGHT BEARING RESTRICTIONS: No  FALLS:  Has patient fallen in last 6 months? Yes. Number of falls 6.  Now using walker.  LIVING ENVIRONMENT: Lives with: lives with their spouse Lives in: House/apartment Stairs: Ramp. Has following equipment at home: Otho Blitz - 2 wheeled  OCCUPATION: Retired.  PLOF: Independent with household mobility with device  PATIENT GOALS: Walk safely without left hip pain.    OBJECTIVE:  Note: Objective measures were completed at Evaluation  unless otherwise noted.   PATIENT SURVEYS:  LEFS 19/80.   PALPATION: Patient pointing to area at mid-point of Aquacel.    LOWER EXTREMITY ROM:  Left knee -10 degrees and right is -15 degrees.  Left hip flexion to 95 degrees.  LOWER EXTREMITY MMT:  Patient performing a right SLR (part of his hospital d/c HEP) and SAQ without assist.     GAIT: Safe gait with a FWW with a decrease in step and stride length.                                                                                                                                  TREATMENT DATE: 06/19/23:  Nustep level 2 x 10 minutes.    PATIENT EDUCATION:  Education details: Reviewed hospital d/c HEP.  Patient has been performing QS, SLR, ankle pumps and supine hip abduction.   Person educated: Patient and Spouse Education method: Medical illustrator Education comprehension: verbalized understanding and returned demonstration  HOME EXERCISE PROGRAM: As above.  ASSESSMENT:  CLINICAL IMPRESSION: The patient presents to [facility] s/p left hip ORIF ~ 2 weeks ago.  He is doing very well.  His aquacel is intact.  He has been compliant to his HEP.  He is walking safely with a FWW with a decrease in step and stride length.  He is able to perform a right SLR (part of his hospital d/c HEP) and SAQ without assist.  He is very motivated and expected to do well.  His LEFS is a 19/80.  Patient will benefit from skilled physical therapy intervention to address pain and deficits.  OBJECTIVE IMPAIRMENTS: decreased activity tolerance, decreased ROM, decreased strength, and pain.   ACTIVITY LIMITATIONS: carrying, lifting, bending, stairs, and locomotion level  PARTICIPATION LIMITATIONS: meal prep, cleaning, laundry, shopping, community activity, and yard work  PERSONAL FACTORS: 1-2 comorbidities: multiple co-morbidities are also affecting patient's functional outcome.   REHAB POTENTIAL: Good  CLINICAL DECISION MAKING:  Stable/uncomplicated  EVALUATION COMPLEXITY: Low   GOALS:  SHORT TERM GOALS: Target date: 07/03/23.  Ind with an initial HEP. Goal status: INITIAL   LONG TERM GOALS: Target date: 08/14/23  Ind with an advanced HEP.  Goal status: INITIAL  2.  Patient walk 1,000 feet with a FWW.  Goal status: INITIAL  3.  Left hip strength to 4+/5 to increase stability for functional tasks  Goal status: INITIAL  4.  Perform ADL's with pain not > 2-3/10.  Goal status: INITIAL  PLAN:  PT FREQUENCY: 15 visits per [place].    PLANNED INTERVENTIONS: 97110-Therapeutic exercises, 97530- Therapeutic activity, [id number]- Neuromuscular re-education, [phone]- Self Care, 60454- Manual therapy, G0283- Electrical stimulation (unattended), Patient/Family education, and Cryotherapy  PLAN FOR NEXT SESSION: Nustep, gait activities, strengthening exercises.     Lilburn Straw, [place], PT 06/19/2023, 12:09 PM

## 2023-06-20 DIAGNOSIS — S72002A Fracture of unspecified part of neck of left femur, initial encounter for closed fracture: Secondary | ICD-10-CM | POA: Diagnosis not present

## 2023-06-22 ENCOUNTER — Ambulatory Visit: Admitting: *Deleted

## 2023-06-22 DIAGNOSIS — M25552 Pain in left hip: Secondary | ICD-10-CM

## 2023-06-22 DIAGNOSIS — M6281 Muscle weakness (generalized): Secondary | ICD-10-CM

## 2023-06-22 NOTE — Therapy (Signed)
 OUTPATIENT PHYSICAL THERAPY LOWER EXTREMITY EVALUATION   Patient Name: Matthew [name] MRN: [id number] DOB:May 19, 1940, 83 y.o., male Today's Date: 06/22/2023  END OF SESSION:  PT End of Session - 06/22/23 0944     Visit Number 2    Number of Visits 15    Date for PT Re-Evaluation 08/14/23    PT Start Time 0930             Past Medical History:  Diagnosis Date   Anticoagulated    plavix    Arthritis    Shoulder, knees, back    CAD in native artery cardiologist-  dr Abel Hoe   a. CAD/NSTEMI ,  cardiac cath staged stenting-- 12-25-2016  PTCA and DES x3 to prox. and mid LAD;  12-26-2016  PCI to PLA and DES x1 to midRCA,  EF 60-65%.   Chronic lower back pain    CKD (chronic kidney disease), stage III (HCC)    Complication of anesthesia    "Too much with shoulder surgery", pt. reports that he was told the at they "lost him, due to absorbing too much anesthesia".  shoulder surgery 1985   DDD (degenerative disc disease), lumbosacral    GERD (gastroesophageal reflux disease)    History of gastric ulcer 01/2017   History of kidney stones    History of malignant melanoma    right side of nose   History of non-ST elevation myocardial infarction (NSTEMI) 12/23/2017   s/p  staged cardiac cath,  s/p  PCI and DEStenting   Hyperlipidemia    Hypertension    IDA (iron deficiency anemia)    Iron deficiency anemia due to chronic blood loss 10/05/2021   Myocardial infarction (HCC)    x 2   Neuromuscular disorder (HCC)    neuropathy   Nocturia    RBBB    Renal calculus, right    S/P drug eluting coronary stent placement 12-25-2017,  12-26-2017   PTCA and DES x3 to prox. and mid LAD;  PCI to PLA and DES x1 to midRCA   Type 2 diabetes mellitus treated with insulin  (HCC)    followed by pcp   Past Surgical History:  Procedure Laterality Date   ANAL FISSURE REPAIR  X 2   BACK SURGERY     CATARACT EXTRACTION W/ INTRAOCULAR LENS  IMPLANT, BILATERAL Bilateral 2017;  2015   COLONOSCOPY  WITH PROPOFOL  N/A 06/05/2016   Procedure: COLONOSCOPY WITH PROPOFOL ;  Surgeon: Garrett Kallman, MD;  [facility];  Service: Endoscopy;  Laterality: N/A;   CONVERSION TO TOTAL KNEE Right 08/25/2020   Procedure: Revision right knee unicompartmental arthroplasty to total knee arthroplasty;  Surgeon: Liliane Rei, MD;  Location: [facility];  Service: Orthopedics;  Laterality: Right;   CORONARY ANGIOGRAPHY N/A 12/26/2016   Procedure: CORONARY ANGIOGRAPHY;  Surgeon: Millicent Ally, MD;  [facility];  Service: Cardiovascular;  Laterality: N/A;   CORONARY STENT INTERVENTION N/A 12/25/2016   Procedure: CORONARY STENT INTERVENTION;  Surgeon: Odie Benne, MD;  [facility];  Service: Cardiovascular;  Laterality: N/A;   CORONARY STENT INTERVENTION N/A 12/26/2016   Procedure: CORONARY STENT INTERVENTION;  Surgeon: Millicent Ally, MD;  [facility];  Service: Cardiovascular;  Laterality: N/A;   CYSTOSCOPY/URETEROSCOPY/HOLMIUM LASER/STENT PLACEMENT Right 01/07/2018   Procedure: RIGHT URETEROSCOPY/HOLMIUM LASER/STENT PLACEMENT;  Surgeon: Samson Croak, MD;  [facility];  Service: Urology;  Laterality: Right;   CYSTOSCOPY/URETEROSCOPY/HOLMIUM LASER/STENT PLACEMENT Left 03/22/2022  Procedure: CYSTOSCOPY LEFT URETEROSCOPY/STENT PLACEMENT;  Surgeon: Samson Croak, MD;  Location: [facility];  Service: Urology;  Laterality: Left;   ESOPHAGOGASTRODUODENOSCOPY (EGD) WITH PROPOFOL  N/A 01/12/2017   Procedure: ESOPHAGOGASTRODUODENOSCOPY (EGD) WITH PROPOFOL ;  Surgeon: Baldo Bonds, MD;  Location[facility];  Service: Endoscopy;  Laterality: N/A;   HAND TENDON SURGERY Right 10-29-2002   dr Donzella Galley @[facility]    right index and thumb   KNEE ARTHROSCOPY Bilateral 2009-;2010   @[facility]    LEFT HEART CATH AND CORONARY ANGIOGRAPHY N/A 12/25/2016   Procedure: LEFT HEART CATH AND CORONARY ANGIOGRAPHY;  Surgeon: Odie Benne, MD;  [facility];  Service: Cardiovascular;  Laterality: N/A;   LEFT HEART CATH AND CORONARY ANGIOGRAPHY N/A 10/10/2019   Procedure: LEFT HEART CATH AND CORONARY ANGIOGRAPHY;  Surgeon: Millicent Ally, MD;  [facility];  Service: Cardiovascular;  Laterality: N/A;   LUMBAR LAMINECTOMY/DECOMPRESSION MICRODISCECTOMY N/A 06/09/2015   Procedure: Laminectomy - T12-L1;  Surgeon: Isadora Mar, MD;  [facility];  Service: Neurosurgery;  Laterality: N/A;  Laminectomy - T12-L1   MEDIAL PARTIAL KNEE REPLACEMENT Bilateral 2009-2010    [facility]    MELANOMA EXCISION Right    "side of my nose"   REVERSE SHOULDER ARTHROPLASTY Right 03/31/2021   Procedure: REVERSE SHOULDER ARTHROPLASTY;  Surgeon: Ellard Gunning, MD;  Location: [facility];  Service: Orthopedics;  Laterality: Right;    SHOULDER SURGERY Left 1985   TOTAL SHOULDER ARTHROPLASTY Left 12/06/2012   Procedure: LEFT TOTAL SHOULDER ARTHROPLASTY VERSES A REVERSE TOTAL SHOULDER ARTHROPLASTY;  Surgeon: Lorriane Rote, MD;  [facility];  Service: Orthopedics;  Laterality: Left;   Patient Active Problem List   Diagnosis Date Noted   Acute CVA (cerebrovascular accident) (HCC) 03/20/2022   Normocytic anemia 10/05/2021   Iron deficiency anemia due to chronic blood loss 10/05/2021   Pain due to unicompartmental arthroplasty of knee (HCC) 08/25/2020   Benign prostatic hyperplasia 06/22/2020   Chronic kidney disease, stage 3 unspecified (HCC) 06/22/2020   Coronary artery disease 06/22/2020   Gastro-esophageal reflux disease without esophagitis 06/22/2020   Kidney stone 06/22/2020   Long term current use of insulin  (HCC) 06/22/2020   Polyneuropathy due to type 2 diabetes mellitus (HCC) 06/22/2020   Peripheral arterial disease (HCC) 11/19/2019   RBBB 01/16/2017   PUD (peptic ulcer disease) 01/16/2017   Melena 01/12/2017   Unstable angina (HCC) 01/11/2017   CAD S/P percutaneous coronary  angioplasty 01/09/2017   Mixed hyperlipidemia 01/09/2017   DM type 2 causing vascular disease (HCC) 12/24/2016   Essential hypertension, benign 12/24/2016   Chest pain, rule out acute myocardial infarction 12/24/2016   ACS (acute coronary syndrome) (HCC)    History of NSTEMI    Dysfunction of right eustachian tube 02/10/2016   Myelopathy (HCC) 06/09/2015    REFERRING PROVIDER: Audery Lean PA  REFERRING DIAG: Left hip ORIF  THERAPY DIAG:  Pain in left hip  Muscle weakness (generalized)  Rationale for Evaluation and Treatment: Rehabilitation  ONSET DATE: 06/05/23  SUBJECTIVE:   SUBJECTIVE STATEMENT: The patient presents to the clinic s/p left hip ORIF performed about 2 weeks ago. He is doing well and walking safely with a FWW and is FWWing over his left LE.  The surgery was in [facility].  He has a visit with his Orthopedist this week in [place].  He pain-level is a 4/10.  He has been doing his HEP.  He is pleased with his progress thus far.  Aquacel intact.  PERTINENT HISTORY: Please see above.  Bilateral TKA's.   PAIN:  Are you having pain? Yes: NPRS scale: 4/10. Pain location: Left hip. Pain description: Sore. Aggravating factors: Rest. Relieving factors: Moving.  PRECAUTIONS: Fall.  Patient needs to use FWW at all times.    RED FLAGS: None   WEIGHT BEARING RESTRICTIONS: No  FALLS:  Has patient fallen in last 6 months? Yes. Number of falls 6.  Now using walker.  LIVING ENVIRONMENT: Lives with: lives with their spouse Lives in: House/apartment Stairs: Ramp. Has following equipment at home: Otho Blitz - 2 wheeled  OCCUPATION: Retired.  PLOF: Independent with household mobility with device  PATIENT GOALS: Walk safely without left hip pain.    OBJECTIVE:  Note: Objective measures were completed at Evaluation unless otherwise noted.   PATIENT SURVEYS:  LEFS 19/80.   PALPATION: Patient pointing to area at mid-point of Aquacel.    LOWER EXTREMITY ROM:  Left knee  -10 degrees and right is -15 degrees.  Left hip flexion to 95 degrees.  LOWER EXTREMITY MMT:  Patient performing a right SLR (part of his hospital d/c HEP) and SAQ without assist.     GAIT: Safe gait with a FWW with a decrease in step and stride length.                                                                                                                                  TREATMENT DATE: 06/22/23:                                     EXERCISE LOG       LT hip  Exercise Repetitions and Resistance Comments   Nustep  level 2 x 16 minutes.    Marching X 10   Side stepping X10 in //bars   Rocker board X 4 mins   Mini-squats 2x10   LAQ's 2# 3x10  pause at top    Ball squeeze    Clamshell Red tband 3x10 with 3 sec hold    Blank cell = exercise not performed today   PATIENT EDUCATION:  Education details: Reviewed hospital d/c HEP.  Patient has been performing QS, SLR, ankle pumps and supine hip abduction.   Person educated: Patient and Spouse Education method: Medical illustrator Education comprehension: verbalized understanding and returned demonstration  HOME EXERCISE PROGRAM: As above.  ASSESSMENT:  CLINICAL IMPRESSION: The patient arrived today  doing fairly well with LT hip. Rx focused on LE strengthening in standing as well as seated. He was also able to perform some balance act,'s in // bars with SBA/CGA at all times. No increased pain end of session  OBJECTIVE IMPAIRMENTS: decreased activity tolerance, decreased ROM, decreased strength, and pain.   ACTIVITY LIMITATIONS: carrying, lifting, bending, stairs, and locomotion level  PARTICIPATION LIMITATIONS: meal prep, cleaning, laundry, shopping, community activity, and yard  work  PERSONAL FACTORS: 1-2 comorbidities: multiple co-morbidities are also affecting patient's functional outcome.   REHAB POTENTIAL: Good  CLINICAL DECISION MAKING: Stable/uncomplicated  EVALUATION COMPLEXITY:  Low   GOALS:  SHORT TERM GOALS: Target date: 07/03/23.  Ind with an initial HEP. Goal status: INITIAL   LONG TERM GOALS: Target date: 08/14/23  Ind with an advanced HEP.  Goal status: INITIAL  2.  Patient walk 1,000 feet with a FWW.  Goal status: INITIAL  3.  Left hip strength to 4+/5 to increase stability for functional tasks  Goal status: INITIAL  4.  Perform ADL's with pain not > 2-3/10.  Goal status: INITIAL  PLAN:  PT FREQUENCY: 15 visits per [place].    PLANNED INTERVENTIONS: 97110-Therapeutic exercises, 97530- Therapeutic activity, [id number]- Neuromuscular re-education, [phone]- Self Care, 60454- Manual therapy, [id number]- Electrical stimulation (unattended), Patient/Family education, and Cryotherapy  PLAN FOR NEXT SESSION: Nustep, gait activities, strengthening exercises.     Raya Mckinstry,CHR[place], PTA 06/22/2023, 10:02 AM

## 2023-06-25 ENCOUNTER — Ambulatory Visit

## 2023-06-25 DIAGNOSIS — M25552 Pain in left hip: Secondary | ICD-10-CM

## 2023-06-25 DIAGNOSIS — M6281 Muscle weakness (generalized): Secondary | ICD-10-CM

## 2023-06-25 NOTE — Therapy (Signed)
 OUTPATIENT PHYSICAL THERAPY LOWER EXTREMITY TREATMENT   Patient Name: Matthew [name] MRN: [id number] DOB:[birth year], 83 y.o., male Today's Date: 06/25/2023  END OF SESSION:  PT End of Session - 06/25/23 0935     Visit Number 3    Number of Visits 15    Date for PT Re-Evaluation 08/14/23    PT Start Time 0930    PT Stop Time 1015    PT Time Calculation (min) 45 min             Past Medical History:  Diagnosis Date   Anticoagulated    plavix    Arthritis    Shoulder, knees, back    CAD in native artery cardiologist-  dr Abel Hoe   a. CAD/NSTEMI ,  cardiac cath staged stenting-- 12-25-2016  PTCA and DES x3 to prox. and mid LAD;  12-26-2016  PCI to PLA and DES x1 to midRCA,  EF 60-65%.   Chronic lower back pain    CKD (chronic kidney disease), stage III (HCC)    Complication of anesthesia    "Too much with shoulder surgery", pt. reports that he was told the at they "lost him, due to absorbing too much anesthesia".  shoulder surgery 1985   DDD (degenerative disc disease), lumbosacral    GERD (gastroesophageal reflux disease)    History of gastric ulcer 01/2017   History of kidney stones    History of malignant melanoma    right side of nose   History of non-ST elevation myocardial infarction (NSTEMI) 12/23/2017   s/p  staged cardiac cath,  s/p  PCI and DEStenting   Hyperlipidemia    Hypertension    IDA (iron deficiency anemia)    Iron deficiency anemia due to chronic blood loss 10/05/2021   Myocardial infarction (HCC)    x 2   Neuromuscular disorder (HCC)    neuropathy   Nocturia    RBBB    Renal calculus, right    S/P drug eluting coronary stent placement 12-25-2017,  12-26-2017   PTCA and DES x3 to prox. and mid LAD;  PCI to PLA and DES x1 to midRCA   Type 2 diabetes mellitus treated with insulin  (HCC)    followed by pcp   Past Surgical History:  Procedure Laterality Date   ANAL FISSURE REPAIR  X 2   BACK SURGERY     CATARACT EXTRACTION W/ INTRAOCULAR LENS   IMPLANT, BILATERAL Bilateral 2017;  2015   COLONOSCOPY WITH PROPOFOL  N/A 06/05/2016   Procedure: COLONOSCOPY WITH PROPOFOL ;  Surgeon: Garrett Kallman, MD;  Location: [facility];  Service: Endoscopy;  Laterality: N/A;   CONVERSION TO TOTAL KNEE Right 08/25/2020   Procedure: Revision right knee unicompartmental arthroplasty to total knee arthroplasty;  Surgeon: Liliane Rei, MD;  Location: [facility];  Service: Orthopedics;  Laterality: Right;   CORONARY ANGIOGRAPHY N/A 12/26/2016   Procedure: CORONARY ANGIOGRAPHY;  Surgeon: Millicent Ally, MD;  Location: [facility];  Service: Cardiovascular;  Laterality: N/A;   CORONARY STENT INTERVENTION N/A 12/25/2016   Procedure: CORONARY STENT INTERVENTION;  Surgeon: Odie Benne, MD;  Location: [facility];  Service: Cardiovascular;  Laterality: N/A;   CORONARY STENT INTERVENTION N/A 12/26/2016   Procedure: CORONARY STENT INTERVENTION;  Surgeon: Millicent Ally, MD;  [facility];  Service: Cardiovascular;  Laterality: N/A;   CYSTOSCOPY/URETEROSCOPY/HOLMIUM LASER/STENT PLACEMENT Right 01/07/2018   Procedure: RIGHT URETEROSCOPY/HOLMIUM LASER/STENT PLACEMENT;  Surgeon: Samson Croak, MD;  Location: [facility];  Service: Urology;  Laterality: Right;   CYSTOSCOPY/URETEROSCOPY/HOLMIUM LASER/STENT PLACEMENT Left 03/22/2022   Procedure: CYSTOSCOPY LEFT URETEROSCOPY/STENT PLACEMENT;  Surgeon: Samson Croak, MD;  Location: [facility];  Service: Urology;  Laterality: Left;   ESOPHAGOGASTRODUODENOSCOPY (EGD) WITH PROPOFOL  N/A 01/12/2017   Procedure: ESOPHAGOGASTRODUODENOSCOPY (EGD) WITH PROPOFOL ;  Surgeon: Baldo Bonds, MD;  Location: [facility];  Service: Endoscopy;  Laterality: N/A;   HAND TENDON SURGERY Right 10-29-2002   dr Donzella Galley @[facility]    right index and thumb   KNEE ARTHROSCOPY Bilateral 2009-;2010   @[facility]    LEFT HEART CATH AND CORONARY ANGIOGRAPHY N/A 12/25/2016   Procedure: LEFT HEART  CATH AND CORONARY ANGIOGRAPHY;  Surgeon: Odie Benne, MD;  [facility];  Service: Cardiovascular;  Laterality: N/A;   LEFT HEART CATH AND CORONARY ANGIOGRAPHY N/A 10/10/2019   Procedure: LEFT HEART CATH AND CORONARY ANGIOGRAPHY;  Surgeon: Millicent Ally, MD;  [facility];  Service: Cardiovascular;  Laterality: N/A;   LUMBAR LAMINECTOMY/DECOMPRESSION MICRODISCECTOMY N/A 06/09/2015   Procedure: Laminectomy - T12-L1;  Surgeon: Isadora Mar, MD;  [facility]: [facility];  Service: Neurosurgery;  Laterality: N/A;  Laminectomy - T12-L1   MEDIAL PARTIAL KNEE REPLACEMENT Bilateral 2009-2010    [facility]    MELANOMA EXCISION Right    "side of my nose"   REVERSE SHOULDER ARTHROPLASTY Right 03/31/2021   Procedure: REVERSE SHOULDER ARTHROPLASTY;  Surgeon: Ellard Gunning, MD;  Location: [facility];  Service: Orthopedics;  Laterality: Right;    SHOULDER SURGERY Left 1985   TOTAL SHOULDER ARTHROPLASTY Left 12/06/2012   Procedure: LEFT TOTAL SHOULDER ARTHROPLASTY VERSES A REVERSE TOTAL SHOULDER ARTHROPLASTY;  Surgeon: Lorriane Rote, MD;  Location: [facility];  Service: Orthopedics;  Laterality: Left;   Patient Active Problem List   Diagnosis Date Noted   Acute CVA (cerebrovascular accident) (HCC) 03/20/2022   Normocytic anemia 10/05/2021   Iron deficiency anemia due to chronic blood loss 10/05/2021   Pain due to unicompartmental arthroplasty of knee (HCC) 08/25/2020   Benign prostatic hyperplasia 06/22/2020   Chronic kidney disease, stage 3 unspecified (HCC) 06/22/2020   Coronary artery disease 06/22/2020   Gastro-esophageal reflux disease without esophagitis 06/22/2020   Kidney stone 06/22/2020   Long term current use of insulin  (HCC) 06/22/2020   Polyneuropathy due to type 2 diabetes mellitus (HCC) 06/22/2020   Peripheral arterial disease (HCC) 11/19/2019   RBBB 01/16/2017   PUD (peptic ulcer disease) 01/16/2017   Melena 01/12/2017   Unstable angina (HCC)  01/11/2017   CAD S/P percutaneous coronary angioplasty 01/09/2017   Mixed hyperlipidemia 01/09/2017   DM type 2 causing vascular disease (HCC) 12/24/2016   Essential hypertension, benign 12/24/2016   Chest pain, rule out acute myocardial infarction 12/24/2016   ACS (acute coronary syndrome) (HCC)    History of NSTEMI    Dysfunction of right eustachian tube 02/10/2016   Myelopathy (HCC) 06/09/2015    REFERRING PROVIDER: Audery Lean PA  REFERRING DIAG: Left hip ORIF  THERAPY DIAG:  Pain in left hip  Muscle weakness (generalized)  Rationale for Evaluation and Treatment: Rehabilitation  ONSET DATE: 06/05/23  SUBJECTIVE:   SUBJECTIVE STATEMENT: Pt reports 5/10 left hip pain with movement, 2/10 at rest.   PERTINENT HISTORY: Please see above.  Bilateral TKA's.   PAIN:  Are you having pain? Yes: NPRS scale: 5/10. Pain location: Left hip. Pain description: Sore. Aggravating factors: Rest. Relieving factors: Moving.  PRECAUTIONS: Fall.  Patient needs to use FWW at all times.  RED FLAGS: None   WEIGHT BEARING RESTRICTIONS: No  FALLS:  Has patient fallen in last 6 months? Yes. Number of falls 6.  Now using walker.  LIVING ENVIRONMENT: Lives with: lives with their spouse Lives in: House/apartment Stairs: Ramp. Has following equipment at home: Otho Blitz - 2 wheeled  OCCUPATION: Retired.  PLOF: Independent with household mobility with device  PATIENT GOALS: Walk safely without left hip pain.    OBJECTIVE:  Note: Objective measures were completed at Evaluation unless otherwise noted.   PATIENT SURVEYS:  LEFS 19/80.   PALPATION: Patient pointing to area at mid-point of Aquacel.    LOWER EXTREMITY ROM:  Left knee -10 degrees and right is -15 degrees.  Left hip flexion to 95 degrees.  LOWER EXTREMITY MMT:  Patient performing a right SLR (part of his hospital d/c HEP) and SAQ without assist.    GAIT: Safe gait with a FWW with a decrease in step and  stride length.                                                                                                                                TREATMENT DATE:   06/25/23:                                    EXERCISE LOG       LT hip  Exercise Repetitions and Resistance Comments  Nustep  level 3 x 15 minutes.    Seated Marching 2# x 15 reps   Side stepping Airex x 3 mins parallel bars   Rocker board X 4 mins   Mini-squats 2x15   LAQ's 2# 3x10  pause at top    Ball squeeze 3 mins   Clamshell Red tband 3x10 with 3 sec hold    Blank cell = exercise not performed today   PATIENT EDUCATION:  Education details: Reviewed hospital d/c HEP.  Patient has been performing QS, SLR, ankle pumps and supine hip abduction.   Person educated: Patient and Spouse Education method: Medical illustrator Education comprehension: verbalized understanding and returned demonstration  HOME EXERCISE PROGRAM: As above.  ASSESSMENT:  CLINICAL IMPRESSION: Pt arrives for today's treatment session reporting 5/10 left hip pain with movement and 2/10 pain when at rest.  Pt introduced to side-stepping on Airex balance beam today with pt requiring BUE support for safety and stability.  Pt able to tolerate addition of 2# weight with seated marches with min cues for posture required.  Pt reported that his hip "feels good" at completion of today's treatment session.   OBJECTIVE IMPAIRMENTS: decreased activity tolerance, decreased ROM, decreased strength, and pain.   ACTIVITY LIMITATIONS: carrying, lifting, bending, stairs, and locomotion level  PARTICIPATION LIMITATIONS: meal prep, cleaning, laundry, shopping, community activity, and yard work  PERSONAL FACTORS: 1-2 comorbidities: multiple co-morbidities are also affecting patient's functional outcome.   REHAB POTENTIAL:  Good  CLINICAL DECISION MAKING: Stable/uncomplicated  EVALUATION COMPLEXITY: Low   GOALS:  SHORT TERM GOALS: Target date:  07/03/23.  Ind with an initial HEP. Goal status: INITIAL   LONG TERM GOALS: Target date: 08/14/23  Ind with an advanced HEP.  Goal status: INITIAL  2.  Patient walk 1,000 feet with a FWW.  Goal status: INITIAL  3.  Left hip strength to 4+/5 to increase stability for functional tasks  Goal status: INITIAL  4.  Perform ADL's with pain not > 2-3/10.  Goal status: INITIAL  PLAN:  PT FREQUENCY: 15 visits per [place].    PLANNED INTERVENTIONS: 97110-Therapeutic exercises, 97530- Therapeutic activity, [id number]- Neuromuscular re-education, [phone]- Self Care, 19147- Manual therapy, [id number]- Electrical stimulation (unattended), Patient/Family education, and Cryotherapy  PLAN FOR NEXT SESSION: Nustep, gait activities, strengthening exercises.     Deryl Flora, PTA 06/25/2023, 10:18 AM

## 2023-06-27 ENCOUNTER — Ambulatory Visit: Admitting: *Deleted

## 2023-06-27 ENCOUNTER — Encounter: Payer: Self-pay | Admitting: *Deleted

## 2023-06-27 DIAGNOSIS — M25552 Pain in left hip: Secondary | ICD-10-CM | POA: Diagnosis not present

## 2023-06-27 DIAGNOSIS — M6281 Muscle weakness (generalized): Secondary | ICD-10-CM

## 2023-06-27 NOTE — Therapy (Signed)
 OUTPATIENT PHYSICAL THERAPY LOWER EXTREMITY TREATMENT   Patient Name: Matthew [name] MRN: [id number] DOB:13-Jun-1940, 83 y.o., male Today's Date: 06/27/2023  END OF SESSION:  PT End of Session - 06/27/23 0942     Visit Number 4    Number of Visits 15    Date for PT Re-Evaluation 08/14/23    Authorization - Visit Number 15    No modalities past 07-03-23   PT Start Time 0935    PT Stop Time 1020    PT Time Calculation (min) 45 min             Past Medical History:  Diagnosis Date   Anticoagulated    plavix    Arthritis    Shoulder, knees, back    CAD in native artery cardiologist-  dr Abel Hoe   a. CAD/NSTEMI ,  cardiac cath staged stenting-- 12-25-2016  PTCA and DES x3 to prox. and mid LAD;  12-26-2016  PCI to PLA and DES x1 to midRCA,  EF 60-65%.   Chronic lower back pain    CKD (chronic kidney disease), stage III (HCC)    Complication of anesthesia    "Too much with shoulder surgery", pt. reports that he was told the at they "lost him, due to absorbing too much anesthesia".  shoulder surgery 1985   DDD (degenerative disc disease), lumbosacral    GERD (gastroesophageal reflux disease)    History of gastric ulcer 01/2017   History of kidney stones    History of malignant melanoma    right side of nose   History of non-ST elevation myocardial infarction (NSTEMI) 12/23/2017   s/p  staged cardiac cath,  s/p  PCI and DEStenting   Hyperlipidemia    Hypertension    IDA (iron deficiency anemia)    Iron deficiency anemia due to chronic blood loss 10/05/2021   Myocardial infarction (HCC)    x 2   Neuromuscular disorder (HCC)    neuropathy   Nocturia    RBBB    Renal calculus, right    S/P drug eluting coronary stent placement 12-25-2017,  12-26-2017   PTCA and DES x3 to prox. and mid LAD;  PCI to PLA and DES x1 to midRCA   Type 2 diabetes mellitus treated with insulin  (HCC)    followed by pcp   Past Surgical History:  Procedure Laterality Date   ANAL FISSURE REPAIR   X 2   BACK SURGERY     CATARACT EXTRACTION W/ INTRAOCULAR LENS  IMPLANT, BILATERAL Bilateral 2017;  2015   COLONOSCOPY WITH PROPOFOL  N/A 06/05/2016   Procedure: COLONOSCOPY WITH PROPOFOL ;  Surgeon: Garrett Kallman, MD;  Location: [facility];  Service: Endoscopy;  Laterality: N/A;   CONVERSION TO TOTAL KNEE Right 08/25/2020   Procedure: Revision right knee unicompartmental arthroplasty to total knee arthroplasty;  Surgeon: Liliane Rei, MD;  Location: [facility];  Service: Orthopedics;  Laterality: Right;   CORONARY ANGIOGRAPHY N/A 12/26/2016   Procedure: CORONARY ANGIOGRAPHY;  Surgeon: Millicent Ally, MD;  [facility];  Service: Cardiovascular;  Laterality: N/A;   CORONARY STENT INTERVENTION N/A 12/25/2016   Procedure: CORONARY STENT INTERVENTION;  Surgeon: Odie Benne, MD;  [facility];  Service: Cardiovascular;  Laterality: N/A;   CORONARY STENT INTERVENTION N/A 12/26/2016   Procedure: CORONARY STENT INTERVENTION;  Surgeon: Millicent Ally, MD;  [facility];  Service: Cardiovascular;  Laterality: N/A;   CYSTOSCOPY/URETEROSCOPY/HOLMIUM LASER/STENT PLACEMENT Right 01/07/2018   Procedure: RIGHT URETEROSCOPY/HOLMIUM  LASER/STENT PLACEMENT;  Surgeon: Samson Croak, MD;  Location: [facility];  Service: Urology;  Laterality: Right;   CYSTOSCOPY/URETEROSCOPY/HOLMIUM LASER/STENT PLACEMENT Left 03/22/2022   Procedure: CYSTOSCOPY LEFT URETEROSCOPY/STENT PLACEMENT;  Surgeon: Samson Croak, MD;  Location: [facility];  Service: Urology;  Laterality: Left;   ESOPHAGOGASTRODUODENOSCOPY (EGD) WITH PROPOFOL  N/A 01/12/2017   Procedure: ESOPHAGOGASTRODUODENOSCOPY (EGD) WITH PROPOFOL ;  Surgeon: Baldo Bonds, MD;  Location: [facility];  Service: Endoscopy;  Laterality: N/A;   HAND TENDON SURGERY Right 10-29-2002   dr Donzella Galley @[facility]    right index and thumb   KNEE ARTHROSCOPY Bilateral 2009-;2010   @[facility]    LEFT HEART CATH  AND CORONARY ANGIOGRAPHY N/A 12/25/2016   Procedure: LEFT HEART CATH AND CORONARY ANGIOGRAPHY;  Surgeon: Odie Benne, MD;  [facility];  Service: Cardiovascular;  Laterality: N/A;   LEFT HEART CATH AND CORONARY ANGIOGRAPHY N/A 10/10/2019   Procedure: LEFT HEART CATH AND CORONARY ANGIOGRAPHY;  Surgeon: Millicent Ally, MD;  [facility];  Service: Cardiovascular;  Laterality: N/A;   LUMBAR LAMINECTOMY/DECOMPRESSION MICRODISCECTOMY N/A 06/09/2015   Procedure: Laminectomy - T12-L1;  Surgeon: Isadora Mar, MD;  [facility];  Service: Neurosurgery;  Laterality: N/A;  Laminectomy - T12-L1   MEDIAL PARTIAL KNEE REPLACEMENT Bilateral 2009-2010    [facility]    MELANOMA EXCISION Right    "side of my nose"   REVERSE SHOULDER ARTHROPLASTY Right 03/31/2021   Procedure: REVERSE SHOULDER ARTHROPLASTY;  Surgeon: Ellard Gunning, MD;  Location: [facility];  Service: Orthopedics;  Laterality: Right;    SHOULDER SURGERY Left 1985   TOTAL SHOULDER ARTHROPLASTY Left 12/06/2012   Procedure: LEFT TOTAL SHOULDER ARTHROPLASTY VERSES A REVERSE TOTAL SHOULDER ARTHROPLASTY;  Surgeon: Lorriane Rote, MD;  Location[facility];  Service: Orthopedics;  Laterality: Left;   Patient Active Problem List   Diagnosis Date Noted   Acute CVA (cerebrovascular accident) (HCC) 03/20/2022   Normocytic anemia 10/05/2021   Iron deficiency anemia due to chronic blood loss 10/05/2021   Pain due to unicompartmental arthroplasty of knee (HCC) 08/25/2020   Benign prostatic hyperplasia 06/22/2020   Chronic kidney disease, stage 3 unspecified (HCC) 06/22/2020   Coronary artery disease 06/22/2020   Gastro-esophageal reflux disease without esophagitis 06/22/2020   Kidney stone 06/22/2020   Long term current use of insulin  (HCC) 06/22/2020   Polyneuropathy due to type 2 diabetes mellitus (HCC) 06/22/2020   Peripheral arterial disease (HCC) 11/19/2019   RBBB 01/16/2017   PUD (peptic ulcer  disease) 01/16/2017   Melena 01/12/2017   Unstable angina (HCC) 01/11/2017   CAD S/P percutaneous coronary angioplasty 01/09/2017   Mixed hyperlipidemia 01/09/2017   DM type 2 causing vascular disease (HCC) 12/24/2016   Essential hypertension, benign 12/24/2016   Chest pain, rule out acute myocardial infarction 12/24/2016   ACS (acute coronary syndrome) (HCC)    History of NSTEMI    Dysfunction of right eustachian tube 02/10/2016   Myelopathy (HCC) 06/09/2015    REFERRING PROVIDER: Audery Lean PA  REFERRING DIAG: Left hip ORIF  THERAPY DIAG:  Pain in left hip  Muscle weakness (generalized)  Rationale for Evaluation and Treatment: Rehabilitation  ONSET DATE: 06/05/23  SUBJECTIVE:   SUBJECTIVE STATEMENT: Pt reports 5/10 left hip pain with movement, 2/10 at rest. Doing better over all   PERTINENT HISTORY: Please see above.  Bilateral TKA's.   PAIN:  Are you having pain? Yes: NPRS scale: 5/10. Pain location: Left hip. Pain description: Sore. Aggravating  factors: Rest. Relieving factors: Moving.  PRECAUTIONS: Fall.  Patient needs to use FWW at all times.    RED FLAGS: None   WEIGHT BEARING RESTRICTIONS: No  FALLS:  Has patient fallen in last 6 months? Yes. Number of falls 6.  Now using walker.  LIVING ENVIRONMENT: Lives with: lives with their spouse Lives in: House/apartment Stairs: Ramp. Has following equipment at home: Otho Blitz - 2 wheeled  OCCUPATION: Retired.  PLOF: Independent with household mobility with device  PATIENT GOALS: Walk safely without left hip pain.    OBJECTIVE:  Note: Objective measures were completed at Evaluation unless otherwise noted.   PATIENT SURVEYS:  LEFS 19/80.   PALPATION: Patient pointing to area at mid-point of Aquacel.    LOWER EXTREMITY ROM:  Left knee -10 degrees and right is -15 degrees.  Left hip flexion to 95 degrees.  LOWER EXTREMITY MMT:  Patient performing a right SLR (part of his hospital d/c HEP)  and SAQ without assist.    GAIT: Safe gait with a FWW with a decrease in step and stride length.                                                                                                                                TREATMENT DATE:   06/27/23:                                    EXERCISE LOG       LT hip  Exercise Repetitions and Resistance Comments  Nustep  level 3 x 15 minutes.    Seated Marching 2# x 15 reps   Side stepping Balance beam  x 3 mins parallel bars   Heel ups/ Toe ups Unable to perform heelups due to weakness   Rocker board X 5 mins balance CGA   Mini-squats 2x15   LAQ's    Ball squeeze X 10 hold 10 secs   Clamshell  tband 3x10 with 3 sec hold   Seated heel ups x20   Seated PF With Red Tband  x 20    Blank cell = exercise not performed today   PATIENT EDUCATION:  Education details: Reviewed hospital d/c HEP.  Patient has been performing QS, SLR, ankle pumps and supine hip abduction.   Person educated: Patient and Spouse Education method: Medical illustrator Education comprehension: verbalized understanding and returned demonstration  HOME EXERCISE PROGRAM: As above.  ASSESSMENT:  CLINICAL IMPRESSION: Pt arrives for today's treatment session reporting 5/10 left hip pain with movement and 2/10 pain when at rest.  He reports HEP and walking doing good at home. He was able to continue with LT LE strengthening and balance exs with no increased pain. Pt unable to perform heel ups due to weakness and   notable during rocker board balance act.'s. PF with Tband given for HEP as well as seated heel  ups.    OBJECTIVE IMPAIRMENTS: decreased activity tolerance, decreased ROM, decreased strength, and pain.   ACTIVITY LIMITATIONS: carrying, lifting, bending, stairs, and locomotion level  PARTICIPATION LIMITATIONS: meal prep, cleaning, laundry, shopping, community activity, and yard work  PERSONAL FACTORS: 1-2 comorbidities: multiple co-morbidities are  also affecting patient's functional outcome.   REHAB POTENTIAL: Good  CLINICAL DECISION MAKING: Stable/uncomplicated  EVALUATION COMPLEXITY: Low   GOALS:  SHORT TERM GOALS: Target date: 07/03/23.  Ind with an initial HEP. Goal status: MET   LONG TERM GOALS: Target date: 08/14/23  Ind with an advanced HEP.  Goal status: INITIAL  2.  Patient walk 1,000 feet with a FWW.  Goal status: INITIAL  3.  Left hip strength to 4+/5 to increase stability for functional tasks  Goal status: INITIAL  4.  Perform ADL's with pain not > 2-3/10.  Goal status: INITIAL  PLAN:  PT FREQUENCY: 15 visits per [place].    PLANNED INTERVENTIONS: 97110-Therapeutic exercises, 97530- Therapeutic activity, [id number]- Neuromuscular re-education, [phone]- Self Care, [id number]- Manual therapy, G0283- Electrical stimulation (unattended), Patient/Family education, and Cryotherapy  PLAN FOR NEXT SESSION: Nustep, gait activities, strengthening exercises.     Masa Lubin,CHRIS, PTA 06/27/2023, 11:31 AM

## 2023-07-03 ENCOUNTER — Ambulatory Visit

## 2023-07-03 DIAGNOSIS — M25552 Pain in left hip: Secondary | ICD-10-CM | POA: Diagnosis not present

## 2023-07-03 DIAGNOSIS — M6281 Muscle weakness (generalized): Secondary | ICD-10-CM

## 2023-07-03 NOTE — Therapy (Signed)
 OUTPATIENT PHYSICAL THERAPY LOWER EXTREMITY TREATMENT   Patient Name: Matthew [name] MRN: [id number] DOB:1940-06-07, 83 y.o., male Today's Date: 07/03/2023  END OF SESSION:  PT End of Session - 07/03/23 0844     Visit Number 5    Number of Visits 15    Date for PT Re-Evaluation 08/14/23    Authorization - Number of Visits 15   No modaliites after 07/03/23   PT Start Time 0844    Activity Tolerance Patient tolerated treatment well    Behavior During Therapy [facility] for tasks assessed/performed              Past Medical History:  Diagnosis Date   Anticoagulated    plavix    Arthritis    Shoulder, knees, back    CAD in native artery cardiologist-  dr Abel Hoe   a. CAD/NSTEMI ,  cardiac cath staged stenting-- 12-25-2016  PTCA and DES x3 to prox. and mid LAD;  12-26-2016  PCI to PLA and DES x1 to midRCA,  EF 60-65%.   Chronic lower back pain    CKD (chronic kidney disease), stage III (HCC)    Complication of anesthesia    "Too much with shoulder surgery", pt. reports that he was told the at they "lost him, due to absorbing too much anesthesia".  shoulder surgery 1985   DDD (degenerative disc disease), lumbosacral    GERD (gastroesophageal reflux disease)    History of gastric ulcer 01/2017   History of kidney stones    History of malignant melanoma    right side of nose   History of non-ST elevation myocardial infarction (NSTEMI) 12/23/2017   s/p  staged cardiac cath,  s/p  PCI and DEStenting   Hyperlipidemia    Hypertension    IDA (iron deficiency anemia)    Iron deficiency anemia due to chronic blood loss 10/05/2021   Myocardial infarction (HCC)    x 2   Neuromuscular disorder (HCC)    neuropathy   Nocturia    RBBB    Renal calculus, right    S/P drug eluting coronary stent placement 12-25-2017,  12-26-2017   PTCA and DES x3 to prox. and mid LAD;  PCI to PLA and DES x1 to midRCA   Type 2 diabetes mellitus treated with insulin  (HCC)    followed by pcp   Past  Surgical History:  Procedure Laterality Date   ANAL FISSURE REPAIR  X 2   BACK SURGERY     CATARACT EXTRACTION W/ INTRAOCULAR LENS  IMPLANT, BILATERAL Bilateral 2017;  2015   COLONOSCOPY WITH PROPOFOL  N/A 06/05/2016   Procedure: COLONOSCOPY WITH PROPOFOL ;  Surgeon: Garrett Kallman, MD;  Location: [facility];  Service: Endoscopy;  Laterality: N/A;   CONVERSION TO TOTAL KNEE Right 08/25/2020   Procedure: Revision right knee unicompartmental arthroplasty to total knee arthroplasty;  Surgeon: Liliane Rei, MD;  Location: [facility];  Service: Orthopedics;  Laterality: Right;   CORONARY ANGIOGRAPHY N/A 12/26/2016   Procedure: CORONARY ANGIOGRAPHY;  Surgeon: Millicent Ally, MD;  [facility]: [facility];  Service: Cardiovascular;  Laterality: N/A;   CORONARY STENT INTERVENTION N/A 12/25/2016   Procedure: CORONARY STENT INTERVENTION;  Surgeon: Odie Benne, MD;  [facility];  Service: Cardiovascular;  Laterality: N/A;   CORONARY STENT INTERVENTION N/A 12/26/2016   Procedure: CORONARY STENT INTERVENTION;  Surgeon: Millicent Ally, MD;  [facility];  Service: Cardiovascular;  Laterality: N/A;   CYSTOSCOPY/URETEROSCOPY/HOLMIUM LASER/STENT PLACEMENT Right 01/07/2018  Procedure: RIGHT URETEROSCOPY/HOLMIUM LASER/STENT PLACEMENT;  Surgeon: Samson Croak, MD;  Location: [facility];  Service: Urology;  Laterality: Right;   CYSTOSCOPY/URETEROSCOPY/HOLMIUM LASER/STENT PLACEMENT Left 03/22/2022   Procedure: CYSTOSCOPY LEFT URETEROSCOPY/STENT PLACEMENT;  Surgeon: Samson Croak, MD;  Location: [facility];  Service: Urology;  Laterality: Left;   ESOPHAGOGASTRODUODENOSCOPY (EGD) WITH PROPOFOL  N/A 01/12/2017   Procedure: ESOPHAGOGASTRODUODENOSCOPY (EGD) WITH PROPOFOL ;  Surgeon: Baldo Bonds, MD;  Location: [facility];  Service: Endoscopy;  Laterality: N/A;   HAND TENDON SURGERY Right 10-29-2002   dr Donzella Galley @[facility]    right index and thumb    KNEE ARTHROSCOPY Bilateral 2009-;2010   @[facility]    LEFT HEART CATH AND CORONARY ANGIOGRAPHY N/A 12/25/2016   Procedure: LEFT HEART CATH AND CORONARY ANGIOGRAPHY;  Surgeon: Odie Benne, MD;  [facility];  Service: Cardiovascular;  Laterality: N/A;   LEFT HEART CATH AND CORONARY ANGIOGRAPHY N/A 10/10/2019   Procedure: LEFT HEART CATH AND CORONARY ANGIOGRAPHY;  Surgeon: Millicent Ally, MD;  [facility];  Service: Cardiovascular;  Laterality: N/A;   LUMBAR LAMINECTOMY/DECOMPRESSION MICRODISCECTOMY N/A 06/09/2015   Procedure: Laminectomy - T12-L1;  Surgeon: Isadora Mar, MD;  [facility];  Service: Neurosurgery;  Laterality: N/A;  Laminectomy - T12-L1   MEDIAL PARTIAL KNEE REPLACEMENT Bilateral 2009-2010    [facility]    MELANOMA EXCISION Right    "side of my nose"   REVERSE SHOULDER ARTHROPLASTY Right 03/31/2021   Procedure: REVERSE SHOULDER ARTHROPLASTY;  Surgeon: Ellard Gunning, MD;  Location: [facility];  Service: Orthopedics;  Laterality: Right;    SHOULDER SURGERY Left 1985   TOTAL SHOULDER ARTHROPLASTY Left 12/06/2012   Procedure: LEFT TOTAL SHOULDER ARTHROPLASTY VERSES A REVERSE TOTAL SHOULDER ARTHROPLASTY;  Surgeon: Lorriane Rote, MD;  [facility];  Service: Orthopedics;  Laterality: Left;   Patient Active Problem List   Diagnosis Date Noted   Acute CVA (cerebrovascular accident) (HCC) 03/20/2022   Normocytic anemia 10/05/2021   Iron deficiency anemia due to chronic blood loss 10/05/2021   Pain due to unicompartmental arthroplasty of knee (HCC) 08/25/2020   Benign prostatic hyperplasia 06/22/2020   Chronic kidney disease, stage 3 unspecified (HCC) 06/22/2020   Coronary artery disease 06/22/2020   Gastro-esophageal reflux disease without esophagitis 06/22/2020   Kidney stone 06/22/2020   Long term current use of insulin  (HCC) 06/22/2020   Polyneuropathy due to type 2 diabetes mellitus (HCC) 06/22/2020   Peripheral  arterial disease (HCC) 11/19/2019   RBBB 01/16/2017   PUD (peptic ulcer disease) 01/16/2017   Melena 01/12/2017   Unstable angina (HCC) 01/11/2017   CAD S/P percutaneous coronary angioplasty 01/09/2017   Mixed hyperlipidemia 01/09/2017   DM type 2 causing vascular disease (HCC) 12/24/2016   Essential hypertension, benign 12/24/2016   Chest pain, rule out acute myocardial infarction 12/24/2016   ACS (acute coronary syndrome) (HCC)    History of NSTEMI    Dysfunction of right eustachian tube 02/10/2016   Myelopathy (HCC) 06/09/2015    REFERRING PROVIDER: Audery Lean PA  REFERRING DIAG: Left hip ORIF  THERAPY DIAG:  Pain in left hip  Muscle weakness (generalized)  Rationale for Evaluation and Treatment: Rehabilitation  ONSET DATE: 06/05/23  SUBJECTIVE:   SUBJECTIVE STATEMENT: Patient reports that his hip feels good everywhere except where they put the screws in.   PERTINENT HISTORY: Please see above.  Bilateral TKA's.   PAIN:  Are you having pain? Yes: NPRS scale: 6-7/10. Pain location: Left hip. Pain  description: Sore. Aggravating factors: Rest. Relieving factors: Moving.  PRECAUTIONS: Fall.  Patient needs to use FWW at all times.    RED FLAGS: None   WEIGHT BEARING RESTRICTIONS: No  FALLS:  Has patient fallen in last 6 months? Yes. Number of falls 6.  Now using walker.  LIVING ENVIRONMENT: Lives with: lives with their spouse Lives in: House/apartment Stairs: Ramp. Has following equipment at home: Otho Blitz - 2 wheeled  OCCUPATION: Retired.  PLOF: Independent with household mobility with device  PATIENT GOALS: Walk safely without left hip pain.    OBJECTIVE:  Note: Objective measures were completed at Evaluation unless otherwise noted.   PATIENT SURVEYS:  LEFS 19/80.   PALPATION: Patient pointing to area at mid-point of Aquacel.    LOWER EXTREMITY ROM:  Left knee -10 degrees and right is -15 degrees.  Left hip flexion to 95 degrees.  LOWER  EXTREMITY MMT:  Patient performing a right SLR (part of his hospital d/c HEP) and SAQ without assist.    GAIT: Safe gait with a FWW with a decrease in step and stride length.                                                                                                                                TREATMENT DATE:                                    07/03/23 EXERCISE LOG  Exercise Repetitions and Resistance Comments  Nustep  L4-5 x 15 minutes @ seat 9   Seated hip ADD isometric  15 reps w/ 15 second hold   Seated HS curl Green t-band x 2 minutes  LLE only  Marching on foam  4 minutes   LAQ 4# x 3 minutes  LLE only  Seated clams  Green t-band x 3.5 minutes    Blank cell = exercise not performed today   06/27/23:  EXERCISE LOG       LT hip  Exercise Repetitions and Resistance Comments  Nustep  level 3 x 15 minutes.    Seated Marching 2# x 15 reps   Side stepping Balance beam  x 3 mins parallel bars   Heel ups/ Toe ups Unable to perform heelups due to weakness   Rocker board X 5 mins balance CGA   Mini-squats 2x15   LAQ's    Ball squeeze X 10 hold 10 secs   Clamshell  tband 3x10 with 3 sec hold   Seated heel ups x20   Seated PF With Red Tband  x 20    Blank cell = exercise not performed today   PATIENT EDUCATION:  Education details: healing, anatomy, and progress with physical therapy Person educated: Patient Education method: Explanation Education comprehension: verbalized understanding  HOME EXERCISE PROGRAM: As above.  ASSESSMENT:  CLINICAL IMPRESSION: Patient was progressed with multiple familiar interventions for  improved lower extremity muscular strength and stability. He required minimal cueing with today's interventions for proper exercise performance. He experienced no increase in his familiar symptoms with any of today's interventions. He reported feeling good upon the conclusion of treatment. He continues to require skilled physical therapy to address his   remaining impairments to return to his prior level of function.   OBJECTIVE IMPAIRMENTS: decreased activity tolerance, decreased ROM, decreased strength, and pain.   ACTIVITY LIMITATIONS: carrying, lifting, bending, stairs, and locomotion level  PARTICIPATION LIMITATIONS: meal prep, cleaning, laundry, shopping, community activity, and yard work  PERSONAL FACTORS: 1-2 comorbidities: multiple co-morbidities are also affecting patient's functional outcome.   REHAB POTENTIAL: Good  CLINICAL DECISION MAKING: Stable/uncomplicated  EVALUATION COMPLEXITY: Low   GOALS:  SHORT TERM GOALS: Target date: 07/03/23.  Ind with an initial HEP. Goal status: MET   LONG TERM GOALS: Target date: 08/14/23  Ind with an advanced HEP.  Goal status: INITIAL  2.  Patient walk 1,000 feet with a FWW.  Goal status: INITIAL  3.  Left hip strength to 4+/5 to increase stability for functional tasks  Goal status: INITIAL  4.  Perform ADL's with pain not > 2-3/10.  Goal status: INITIAL  PLAN:  PT FREQUENCY: 15 visits per [place].    PLANNED INTERVENTIONS: 97110-Therapeutic exercises, 97530- Therapeutic activity, [id number]- Neuromuscular re-education, [phone]- Self Care, 86578- Manual therapy, [id number]- Electrical stimulation (unattended), Patient/Family education, and Cryotherapy  PLAN FOR NEXT SESSION: Nustep, gait activities, strengthening exercises.     Lane Pinon, PT 07/03/2023, 12:19 PM

## 2023-07-04 ENCOUNTER — Encounter: Admitting: Physical Therapy

## 2023-07-09 ENCOUNTER — Encounter: Payer: Self-pay | Admitting: Hematology

## 2023-07-09 ENCOUNTER — Ambulatory Visit: Attending: Physician Assistant | Admitting: *Deleted

## 2023-07-09 DIAGNOSIS — M25552 Pain in left hip: Secondary | ICD-10-CM | POA: Diagnosis present

## 2023-07-09 DIAGNOSIS — M6281 Muscle weakness (generalized): Secondary | ICD-10-CM | POA: Insufficient documentation

## 2023-07-09 NOTE — Therapy (Signed)
 OUTPATIENT PHYSICAL THERAPY LOWER EXTREMITY TREATMENT   Patient Name: Matthew [name] MRN: [id number] DOB:Jun 01, 1940, 83 y.o., male Today's Date: 07/09/2023  END OF SESSION:  PT End of Session - 07/09/23 0932     Visit Number 6    Number of Visits 15    Date for PT Re-Evaluation 08/14/23    Authorization - Visit Number 15     PT Start Time 0930    PT Stop Time 1018    PT Time Calculation (min) 48 min              Past Medical History:  Diagnosis Date   Anticoagulated    plavix    Arthritis    Shoulder, knees, back    CAD in native artery cardiologist-  dr Abel Hoe   a. CAD/NSTEMI ,  cardiac cath staged stenting-- 12-25-2016  PTCA and DES x3 to prox. and mid LAD;  12-26-2016  PCI to PLA and DES x1 to midRCA,  EF 60-65%.   Chronic lower back pain    CKD (chronic kidney disease), stage III (HCC)    Complication of anesthesia    "Too much with shoulder surgery", pt. reports that he was told the at they "lost him, due to absorbing too much anesthesia".  shoulder surgery 1985   DDD (degenerative disc disease), lumbosacral    GERD (gastroesophageal reflux disease)    History of gastric ulcer 01/2017   History of kidney stones    History of malignant melanoma    right side of nose   History of non-ST elevation myocardial infarction (NSTEMI) 12/23/2017   s/p  staged cardiac cath,  s/p  PCI and DEStenting   Hyperlipidemia    Hypertension    IDA (iron deficiency anemia)    Iron deficiency anemia due to chronic blood loss 10/05/2021   Myocardial infarction (HCC)    x 2   Neuromuscular disorder (HCC)    neuropathy   Nocturia    RBBB    Renal calculus, right    S/P drug eluting coronary stent placement 12-25-2017,  12-26-2017   PTCA and DES x3 to prox. and mid LAD;  PCI to PLA and DES x1 to midRCA   Type 2 diabetes mellitus treated with insulin  (HCC)    followed by pcp   Past Surgical History:  Procedure Laterality Date   ANAL FISSURE REPAIR  X 2   BACK SURGERY      CATARACT EXTRACTION W/ INTRAOCULAR LENS  IMPLANT, BILATERAL Bilateral 2017;  2015   COLONOSCOPY WITH PROPOFOL  N/A 06/05/2016   Procedure: COLONOSCOPY WITH PROPOFOL ;  Surgeon: Garrett Kallman, MD;  Location: [facility];  Service: Endoscopy;  Laterality: N/A;   CONVERSION TO TOTAL KNEE Right 08/25/2020   Procedure: Revision right knee unicompartmental arthroplasty to total knee arthroplasty;  Surgeon: Liliane Rei, MD;  Location: [facility];  Service: Orthopedics;  Laterality: Right;   CORONARY ANGIOGRAPHY N/A 12/26/2016   Procedure: CORONARY ANGIOGRAPHY;  Surgeon: Millicent Ally, MD;  [facility];  Service: Cardiovascular;  Laterality: N/A;   CORONARY STENT INTERVENTION N/A 12/25/2016   Procedure: CORONARY STENT INTERVENTION;  Surgeon: Odie Benne, MD;  [facility];  Service: Cardiovascular;  Laterality: N/A;   CORONARY STENT INTERVENTION N/A 12/26/2016   Procedure: CORONARY STENT INTERVENTION;  Surgeon: Millicent Ally, MD;  [facility];  Service: Cardiovascular;  Laterality: N/A;   CYSTOSCOPY/URETEROSCOPY/HOLMIUM LASER/STENT PLACEMENT Right 01/07/2018   Procedure: RIGHT URETEROSCOPY/HOLMIUM LASER/STENT PLACEMENT;  Surgeon:  Samson Croak, MD;  Location: [facility];  Service: Urology;  Laterality: Right;   CYSTOSCOPY/URETEROSCOPY/HOLMIUM LASER/STENT PLACEMENT Left 03/22/2022   Procedure: CYSTOSCOPY LEFT URETEROSCOPY/STENT PLACEMENT;  Surgeon: Samson Croak, MD;  Location: [facility];  Service: Urology;  Laterality: Left;   ESOPHAGOGASTRODUODENOSCOPY (EGD) WITH PROPOFOL  N/A 01/12/2017   Procedure: ESOPHAGOGASTRODUODENOSCOPY (EGD) WITH PROPOFOL ;  Surgeon: Baldo Bonds, MD;  Location: [facility];  Service: Endoscopy;  Laterality: N/A;   HAND TENDON SURGERY Right 10-29-2002   dr Donzella Galley @[facility]    right index and thumb   KNEE ARTHROSCOPY Bilateral 2009-;2010   @[facility]    LEFT HEART CATH AND CORONARY ANGIOGRAPHY  N/A 12/25/2016   Procedure: LEFT HEART CATH AND CORONARY ANGIOGRAPHY;  Surgeon: Odie Benne, MD;  [facility];  Service: Cardiovascular;  Laterality: N/A;   LEFT HEART CATH AND CORONARY ANGIOGRAPHY N/A 10/10/2019   Procedure: LEFT HEART CATH AND CORONARY ANGIOGRAPHY;  Surgeon: Millicent Ally, MD;  [facility];  Service: Cardiovascular;  Laterality: N/A;   LUMBAR LAMINECTOMY/DECOMPRESSION MICRODISCECTOMY N/A 06/09/2015   Procedure: Laminectomy - T12-L1;  Surgeon: Isadora Mar, MD;  [facility];  Service: Neurosurgery;  Laterality: N/A;  Laminectomy - T12-L1   MEDIAL PARTIAL KNEE REPLACEMENT Bilateral 2009-2010    [facility]    MELANOMA EXCISION Right    "side of my nose"   REVERSE SHOULDER ARTHROPLASTY Right 03/31/2021   Procedure: REVERSE SHOULDER ARTHROPLASTY;  Surgeon: Ellard Gunning, MD;  Location: [facility];  Service: Orthopedics;  Laterality: Right;    SHOULDER SURGERY Left 1985   TOTAL SHOULDER ARTHROPLASTY Left 12/06/2012   Procedure: LEFT TOTAL SHOULDER ARTHROPLASTY VERSES A REVERSE TOTAL SHOULDER ARTHROPLASTY;  Surgeon: Lorriane Rote, MD;  Location[facility];  Service: Orthopedics;  Laterality: Left;   Patient Active Problem List   Diagnosis Date Noted   Acute CVA (cerebrovascular accident) (HCC) 03/20/2022   Normocytic anemia 10/05/2021   Iron deficiency anemia due to chronic blood loss 10/05/2021   Pain due to unicompartmental arthroplasty of knee (HCC) 08/25/2020   Benign prostatic hyperplasia 06/22/2020   Chronic kidney disease, stage 3 unspecified (HCC) 06/22/2020   Coronary artery disease 06/22/2020   Gastro-esophageal reflux disease without esophagitis 06/22/2020   Kidney stone 06/22/2020   Long term current use of insulin  (HCC) 06/22/2020   Polyneuropathy due to type 2 diabetes mellitus (HCC) 06/22/2020   Peripheral arterial disease (HCC) 11/19/2019   RBBB 01/16/2017   PUD (peptic ulcer disease) 01/16/2017    Melena 01/12/2017   Unstable angina (HCC) 01/11/2017   CAD S/P percutaneous coronary angioplasty 01/09/2017   Mixed hyperlipidemia 01/09/2017   DM type 2 causing vascular disease (HCC) 12/24/2016   Essential hypertension, benign 12/24/2016   Chest pain, rule out acute myocardial infarction 12/24/2016   ACS (acute coronary syndrome) (HCC)    History of NSTEMI    Dysfunction of right eustachian tube 02/10/2016   Myelopathy (HCC) 06/09/2015    REFERRING PROVIDER: Audery Lean PA  REFERRING DIAG: Left hip ORIF  THERAPY DIAG:  Pain in left hip  Muscle weakness (generalized)  Rationale for Evaluation and Treatment: Rehabilitation  ONSET DATE: 06/05/23  SUBJECTIVE:   SUBJECTIVE STATEMENT: Patient reports that he slipped in the rain and fell.  LT hip feels okay, but very sore. MD appt in about 2 weeks   PERTINENT HISTORY: Please see above.  Bilateral TKA's.   PAIN:  Are you having pain? Yes: NPRS scale: 6-7/10. Pain location: Left  hip. Pain description: Sore. Aggravating factors: Rest. Relieving factors: Moving.  PRECAUTIONS: Fall.  Patient needs to use FWW at all times.    RED FLAGS: None   WEIGHT BEARING RESTRICTIONS: No  FALLS:  Has patient fallen in last 6 months? Yes. Number of falls 6.  Now using walker.  LIVING ENVIRONMENT: Lives with: lives with their spouse Lives in: House/apartment Stairs: Ramp. Has following equipment at home: Otho Blitz - 2 wheeled  OCCUPATION: Retired.  PLOF: Independent with household mobility with device  PATIENT GOALS: Walk safely without left hip pain.    OBJECTIVE:  Note: Objective measures were completed at Evaluation unless otherwise noted.   PATIENT SURVEYS:  LEFS 19/80.   PALPATION: Patient pointing to area at mid-point of Aquacel.    LOWER EXTREMITY ROM:  Left knee -10 degrees and right is -15 degrees.  Left hip flexion to 95 degrees.  LOWER EXTREMITY MMT:  Patient performing a right SLR (part of his hospital  d/c HEP) and SAQ without assist.    GAIT: Safe gait with a FWW with a decrease in step and stride length.                                                                                                                                TREATMENT DATE:                                    07/09/23 EXERCISE LOG  Exercise Repetitions and Resistance Comments  Nustep  L4-5 x 15 minutes @ seat 9   Seated hip ADD isometric  20  reps w/ 5 second hold   Seated HS curl Green t-band  3 x12  LLE only  Marching on foam     Rocker board  X 6 mins balance   LAQ 4# x 3 minutes  LLE only  Seated clams  Green t-band 3  x  10     Blank cell = exercise not performed today   06/27/23:  EXERCISE LOG       LT hip  Exercise Repetitions and Resistance Comments  Nustep  level 3 x 15 minutes.    Seated Marching 2# x 15 reps   Side stepping Balance beam  x 3 mins parallel bars   Heel ups/ Toe ups Unable to perform heelups due to weakness   Rocker board X 5 mins balance CGA   Mini-squats 2x15   LAQ's    Ball squeeze X 10 hold 10 secs   Clamshell  tband 3x10 with 3 sec hold   Seated heel ups x20   Seated PF With Red Tband  x 20    Blank cell = exercise not performed today   PATIENT EDUCATION:  Education details: healing, anatomy, and progress with physical therapy Person educated: Patient Education method: Explanation Education comprehension: verbalized understanding  HOME EXERCISE PROGRAM: As  above.  ASSESSMENT:  CLINICAL IMPRESSION: Patient  reports falling  a few days ago after slipping in the rain. He reports increased LT hip soreness, but okay and will f/u with MD in 2 weeks. He was able to continue with LE strengthening exs as well as balance act.'s with some hip soreness, but no increased pain end of session.    OBJECTIVE IMPAIRMENTS: decreased activity tolerance, decreased ROM, decreased strength, and pain.   ACTIVITY LIMITATIONS: carrying, lifting, bending, stairs, and locomotion  level  PARTICIPATION LIMITATIONS: meal prep, cleaning, laundry, shopping, community activity, and yard work  PERSONAL FACTORS: 1-2 comorbidities: multiple co-morbidities are also affecting patient's functional outcome.   REHAB POTENTIAL: Good  CLINICAL DECISION MAKING: Stable/uncomplicated  EVALUATION COMPLEXITY: Low   GOALS:  SHORT TERM GOALS: Target date: 07/03/23.  Ind with an initial HEP. Goal status: MET   LONG TERM GOALS: Target date: 08/14/23  Ind with an advanced HEP.  Goal status: INITIAL  2.  Patient walk 1,000 feet with a FWW.  Goal status: INITIAL  3.  Left hip strength to 4+/5 to increase stability for functional tasks  Goal status: INITIAL  4.  Perform ADL's with pain not > 2-3/10.  Goal status: INITIAL  PLAN:  PT FREQUENCY: 15 visits per [place].    PLANNED INTERVENTIONS: 97110-Therapeutic exercises, 97530- Therapeutic activity, [id number]- Neuromuscular re-education, [phone]- Self Care, [id number]- Manual therapy, [id number]- Electrical stimulation (unattended), Patient/Family education, and Cryotherapy  PLAN FOR NEXT SESSION: Nustep, gait activities, strengthening exercises.  No Modalities after 2 weeks   Geralene Afshar,CHRIS, PTA 07/09/2023, 12:06 PM

## 2023-07-11 ENCOUNTER — Ambulatory Visit: Admitting: Physical Therapy

## 2023-07-11 DIAGNOSIS — M25552 Pain in left hip: Secondary | ICD-10-CM

## 2023-07-11 DIAGNOSIS — M6281 Muscle weakness (generalized): Secondary | ICD-10-CM

## 2023-07-11 NOTE — Therapy (Signed)
 OUTPATIENT PHYSICAL THERAPY LOWER EXTREMITY TREATMENT   Patient Name: Matthew [name] MRN: [id number] DOB:September 07, 1940, 83 y.o., male Today's Date: 07/11/2023  END OF SESSION:  PT End of Session - 07/11/23 0957     Visit Number 7    Number of Visits 15    Date for PT Re-Evaluation 08/14/23    PT Start Time 0931    PT Stop Time 1017    PT Time Calculation (min) 46 min    Activity Tolerance Patient tolerated treatment well    Behavior During Therapy [facility] for tasks assessed/performed               Past Medical History:  Diagnosis Date   Anticoagulated    plavix    Arthritis    Shoulder, knees, back    CAD in native artery cardiologist-  dr Abel Hoe   a. CAD/NSTEMI ,  cardiac cath staged stenting-- 12-25-2016  PTCA and DES x3 to prox. and mid LAD;  12-26-2016  PCI to PLA and DES x1 to midRCA,  EF 60-65%.   Chronic lower back pain    CKD (chronic kidney disease), stage III (HCC)    Complication of anesthesia    "Too much with shoulder surgery", pt. reports that he was told the at they "lost him, due to absorbing too much anesthesia".  shoulder surgery 1985   DDD (degenerative disc disease), lumbosacral    GERD (gastroesophageal reflux disease)    History of gastric ulcer 01/2017   History of kidney stones    History of malignant melanoma    right side of nose   History of non-ST elevation myocardial infarction (NSTEMI) 12/23/2017   s/p  staged cardiac cath,  s/p  PCI and DEStenting   Hyperlipidemia    Hypertension    IDA (iron deficiency anemia)    Iron deficiency anemia due to chronic blood loss 10/05/2021   Myocardial infarction (HCC)    x 2   Neuromuscular disorder (HCC)    neuropathy   Nocturia    RBBB    Renal calculus, right    S/P drug eluting coronary stent placement 12-25-2017,  12-26-2017   PTCA and DES x3 to prox. and mid LAD;  PCI to PLA and DES x1 to midRCA   Type 2 diabetes mellitus treated with insulin  (HCC)    followed by pcp   Past Surgical  History:  Procedure Laterality Date   ANAL FISSURE REPAIR  X 2   BACK SURGERY     CATARACT EXTRACTION W/ INTRAOCULAR LENS  IMPLANT, BILATERAL Bilateral 2017;  2015   COLONOSCOPY WITH PROPOFOL  N/A 06/05/2016   Procedure: COLONOSCOPY WITH PROPOFOL ;  Surgeon: Garrett Kallman, MD;  Location: [facility];  Service: Endoscopy;  Laterality: N/A;   CONVERSION TO TOTAL KNEE Right 08/25/2020   Procedure: Revision right knee unicompartmental arthroplasty to total knee arthroplasty;  Surgeon: Liliane Rei, MD;  Location: [facility];  Service: Orthopedics;  Laterality: Right;   CORONARY ANGIOGRAPHY N/A 12/26/2016   Procedure: CORONARY ANGIOGRAPHY;  Surgeon: Millicent Ally, MD;  [facility];  Service: Cardiovascular;  Laterality: N/A;   CORONARY STENT INTERVENTION N/A 12/25/2016   Procedure: CORONARY STENT INTERVENTION;  Surgeon: Odie Benne, MD;  [facility];  Service: Cardiovascular;  Laterality: N/A;   CORONARY STENT INTERVENTION N/A 12/26/2016   Procedure: CORONARY STENT INTERVENTION;  Surgeon: Millicent Ally, MD;  [facility];  Service: Cardiovascular;  Laterality: N/A;   CYSTOSCOPY/URETEROSCOPY/HOLMIUM LASER/STENT PLACEMENT  Right 01/07/2018   Procedure: RIGHT URETEROSCOPY/HOLMIUM LASER/STENT PLACEMENT;  Surgeon: Samson Croak, MD;  Location: [facility];  Service: Urology;  Laterality: Right;   CYSTOSCOPY/URETEROSCOPY/HOLMIUM LASER/STENT PLACEMENT Left 03/22/2022   Procedure: CYSTOSCOPY LEFT URETEROSCOPY/STENT PLACEMENT;  Surgeon: Samson Croak, MD;  [facility]: [facility];  Service: Urology;  Laterality: Left;   ESOPHAGOGASTRODUODENOSCOPY (EGD) WITH PROPOFOL  N/A 01/12/2017   Procedure: ESOPHAGOGASTRODUODENOSCOPY (EGD) WITH PROPOFOL ;  Surgeon: Baldo Bonds, MD;  Location: [facility];  Service: Endoscopy;  Laterality: N/A;   HAND TENDON SURGERY Right 10-29-2002   dr Donzella Galley @[facility]    right index and thumb   KNEE  ARTHROSCOPY Bilateral 2009-;2010   @[facility]    LEFT HEART CATH AND CORONARY ANGIOGRAPHY N/A 12/25/2016   Procedure: LEFT HEART CATH AND CORONARY ANGIOGRAPHY;  Surgeon: Odie Benne, MD;  [facility];  Service: Cardiovascular;  Laterality: N/A;   LEFT HEART CATH AND CORONARY ANGIOGRAPHY N/A 10/10/2019   Procedure: LEFT HEART CATH AND CORONARY ANGIOGRAPHY;  Surgeon: Millicent Ally, MD;  [facility];  Service: Cardiovascular;  Laterality: N/A;   LUMBAR LAMINECTOMY/DECOMPRESSION MICRODISCECTOMY N/A 06/09/2015   Procedure: Laminectomy - T12-L1;  Surgeon: Isadora Mar, MD;  [facility];  Service: Neurosurgery;  Laterality: N/A;  Laminectomy - T12-L1   MEDIAL PARTIAL KNEE REPLACEMENT Bilateral 2009-2010    [facility]    MELANOMA EXCISION Right    "side of my nose"   REVERSE SHOULDER ARTHROPLASTY Right 03/31/2021   Procedure: REVERSE SHOULDER ARTHROPLASTY;  Surgeon: Ellard Gunning, MD;  [facility]: [facility];  Service: Orthopedics;  Laterality: Right;    SHOULDER SURGERY Left 1985   TOTAL SHOULDER ARTHROPLASTY Left 12/06/2012   Procedure: LEFT TOTAL SHOULDER ARTHROPLASTY VERSES A REVERSE TOTAL SHOULDER ARTHROPLASTY;  Surgeon: Lorriane Rote, MD;  [facility];  Service: Orthopedics;  Laterality: Left;   Patient Active Problem List   Diagnosis Date Noted   Acute CVA (cerebrovascular accident) (HCC) 03/20/2022   Normocytic anemia 10/05/2021   Iron deficiency anemia due to chronic blood loss 10/05/2021   Pain due to unicompartmental arthroplasty of knee (HCC) 08/25/2020   Benign prostatic hyperplasia 06/22/2020   Chronic kidney disease, stage 3 unspecified (HCC) 06/22/2020   Coronary artery disease 06/22/2020   Gastro-esophageal reflux disease without esophagitis 06/22/2020   Kidney stone 06/22/2020   Long term current use of insulin  (HCC) 06/22/2020   Polyneuropathy due to type 2 diabetes mellitus (HCC) 06/22/2020   Peripheral arterial  disease (HCC) 11/19/2019   RBBB 01/16/2017   PUD (peptic ulcer disease) 01/16/2017   Melena 01/12/2017   Unstable angina (HCC) 01/11/2017   CAD S/P percutaneous coronary angioplasty 01/09/2017   Mixed hyperlipidemia 01/09/2017   DM type 2 causing vascular disease (HCC) 12/24/2016   Essential hypertension, benign 12/24/2016   Chest pain, rule out acute myocardial infarction 12/24/2016   ACS (acute coronary syndrome) (HCC)    History of NSTEMI    Dysfunction of right eustachian tube 02/10/2016   Myelopathy (HCC) 06/09/2015    REFERRING PROVIDER: Audery Lean PA  REFERRING DIAG: Left hip ORIF  THERAPY DIAG:  Pain in left hip  Muscle weakness (generalized)  Rationale for Evaluation and Treatment: Rehabilitation  ONSET DATE: 06/05/23  SUBJECTIVE:   SUBJECTIVE STATEMENT: Got about 10 X-rays yesterday.  PERTINENT HISTORY: Please see above.  Bilateral TKA's.   PAIN:  Are you having pain? Yes: NPRS scale: 6-7/10. Pain location: Left hip. Pain description: Sore. Aggravating factors: Rest. Relieving factors:  Moving.  PRECAUTIONS: Fall.  Patient needs to use FWW at all times.    RED FLAGS: None   WEIGHT BEARING RESTRICTIONS: No  FALLS:  Has patient fallen in last 6 months? Yes. Number of falls 6.  Now using walker.  LIVING ENVIRONMENT: Lives with: lives with their spouse Lives in: House/apartment Stairs: Ramp. Has following equipment at home: Otho Blitz - 2 wheeled  OCCUPATION: Retired.  PLOF: Independent with household mobility with device  PATIENT GOALS: Walk safely without left hip pain.    OBJECTIVE:  Note: Objective measures were completed at Evaluation unless otherwise noted.   PATIENT SURVEYS:  LEFS 19/80.   PALPATION: Patient pointing to area at mid-point of Aquacel.    LOWER EXTREMITY ROM:  Left knee -10 degrees and right is -15 degrees.  Left hip flexion to 95 degrees.  LOWER EXTREMITY MMT:  Patient performing a right SLR (part of his  hospital d/c HEP) and SAQ without assist.    GAIT: Safe gait with a FWW with a decrease in step and stride length.                                                                                                                                TREATMENT DATE:   07/11/23:                                     EXERCISE LOG  Exercise Repetitions and Resistance Comments  Nustep Level 2 x 21 minutes.   Seated ball squeeze 5 minutes   LAQ's 5# x 5 minutes   Ham curls  Green theraband x 2 minutes   Seated hip abd Green theraband x 3 minutes   Rockerboard 5 minutes.                                      07/09/23 EXERCISE LOG  Exercise Repetitions and Resistance Comments  Nustep  L4-5 x 15 minutes @ seat 9   Seated hip ADD isometric  20  reps w/ 5 second hold   Seated HS curl Green t-band  3 x12  LLE only  Marching on foam     Rocker board  X 6 mins balance   LAQ 4# x 3 minutes  LLE only  Seated clams  Green t-band 3  x  10     Blank cell = exercise not performed today   06/27/23:  EXERCISE LOG       LT hip  Exercise Repetitions and Resistance Comments  Nustep  level 3 x 15 minutes.    Seated Marching 2# x 15 reps   Side stepping Balance beam  x 3 mins parallel bars   Heel ups/ Toe ups Unable to perform heelups due to weakness   Rocker board X  5 mins balance CGA   Mini-squats 2x15   LAQ's    Ball squeeze X 10 hold 10 secs   Clamshell  tband 3x10 with 3 sec hold   Seated heel ups x20   Seated PF With Red Tband  x 20    Blank cell = exercise not performed today   PATIENT EDUCATION:  Education details: healing, anatomy, and progress with physical therapy Person educated: Patient Education method: Explanation Education comprehension: verbalized understanding  HOME EXERCISE PROGRAM: As above.  ASSESSMENT:  CLINICAL IMPRESSION: Patient did well with PT today.  Ribs sore from fall.  He had multiple X-rays yesterday.   OBJECTIVE IMPAIRMENTS: decreased activity tolerance,  decreased ROM, decreased strength, and pain.   ACTIVITY LIMITATIONS: carrying, lifting, bending, stairs, and locomotion level  PARTICIPATION LIMITATIONS: meal prep, cleaning, laundry, shopping, community activity, and yard work  PERSONAL FACTORS: 1-2 comorbidities: multiple co-morbidities are also affecting patient's functional outcome.   REHAB POTENTIAL: Good  CLINICAL DECISION MAKING: Stable/uncomplicated  EVALUATION COMPLEXITY: Low   GOALS:  SHORT TERM GOALS: Target date: 07/03/23.  Ind with an initial HEP. Goal status: MET   LONG TERM GOALS: Target date: 08/14/23  Ind with an advanced HEP.  Goal status: INITIAL  2.  Patient walk 1,000 feet with a FWW.  Goal status: INITIAL  3.  Left hip strength to 4+/5 to increase stability for functional tasks  Goal status: INITIAL  4.  Perform ADL's with pain not > 2-3/10.  Goal status: INITIAL  PLAN:  PT FREQUENCY: 15 visits per [place].    PLANNED INTERVENTIONS: 97110-Therapeutic exercises, 97530- Therapeutic activity, [id number]- Neuromuscular re-education, [phone]- Self Care, 62130- Manual therapy, G0283- Electrical stimulation (unattended), Patient/Family education, and Cryotherapy  PLAN FOR NEXT SESSION: Nustep, gait activities, strengthening exercises.  No Modalities after 2 weeks   Jancarlo Biermann, [place], PT 07/11/2023, 10:49 AM

## 2023-07-16 ENCOUNTER — Ambulatory Visit: Admitting: Physical Therapy

## 2023-07-16 DIAGNOSIS — M25552 Pain in left hip: Secondary | ICD-10-CM

## 2023-07-16 DIAGNOSIS — M6281 Muscle weakness (generalized): Secondary | ICD-10-CM

## 2023-07-16 NOTE — Therapy (Signed)
 OUTPATIENT PHYSICAL THERAPY LOWER EXTREMITY TREATMENT   Patient Name: Matthew [name] MRN: [id number] DOB:December 18, 1940, 83 y.o., male Today's Date: 07/16/2023  END OF SESSION:  PT End of Session - 07/16/23 0940     Visit Number 8    Number of Visits 15    Date for PT Re-Evaluation 08/14/23    PT Start Time 0930    PT Stop Time 1015    PT Time Calculation (min) 45 min    Activity Tolerance Patient tolerated treatment well    Behavior During Therapy [facility] for tasks assessed/performed               Past Medical History:  Diagnosis Date   Anticoagulated    plavix    Arthritis    Shoulder, knees, back    CAD in native artery cardiologist-  dr Abel Hoe   a. CAD/NSTEMI ,  cardiac cath staged stenting-- 12-25-2016  PTCA and DES x3 to prox. and mid LAD;  12-26-2016  PCI to PLA and DES x1 to midRCA,  EF 60-65%.   Chronic lower back pain    CKD (chronic kidney disease), stage III (HCC)    Complication of anesthesia    "Too much with shoulder surgery", pt. reports that he was told the at they "lost him, due to absorbing too much anesthesia".  shoulder surgery 1985   DDD (degenerative disc disease), lumbosacral    GERD (gastroesophageal reflux disease)    History of gastric ulcer 01/2017   History of kidney stones    History of malignant melanoma    right side of nose   History of non-ST elevation myocardial infarction (NSTEMI) 12/23/2017   s/p  staged cardiac cath,  s/p  PCI and DEStenting   Hyperlipidemia    Hypertension    IDA (iron deficiency anemia)    Iron deficiency anemia due to chronic blood loss 10/05/2021   Myocardial infarction (HCC)    x 2   Neuromuscular disorder (HCC)    neuropathy   Nocturia    RBBB    Renal calculus, right    S/P drug eluting coronary stent placement 12-25-2017,  12-26-2017   PTCA and DES x3 to prox. and mid LAD;  PCI to PLA and DES x1 to midRCA   Type 2 diabetes mellitus treated with insulin  (HCC)    followed by pcp   Past Surgical  History:  Procedure Laterality Date   ANAL FISSURE REPAIR  X 2   BACK SURGERY     CATARACT EXTRACTION W/ INTRAOCULAR LENS  IMPLANT, BILATERAL Bilateral 2017;  2015   COLONOSCOPY WITH PROPOFOL  N/A 06/05/2016   Procedure: COLONOSCOPY WITH PROPOFOL ;  Surgeon: Garrett Kallman, MD;  [facility];  Service: Endoscopy;  Laterality: N/A;   CONVERSION TO TOTAL KNEE Right 08/25/2020   Procedure: Revision right knee unicompartmental arthroplasty to total knee arthroplasty;  Surgeon: Liliane Rei, MD;  [facility]: [facility];  Service: Orthopedics;  Laterality: Right;   CORONARY ANGIOGRAPHY N/A 12/26/2016   Procedure: CORONARY ANGIOGRAPHY;  Surgeon: Millicent Ally, MD;  [facility];  Service: Cardiovascular;  Laterality: N/A;   CORONARY STENT INTERVENTION N/A 12/25/2016   Procedure: CORONARY STENT INTERVENTION;  Surgeon: Odie Benne, MD;  [facility];  Service: Cardiovascular;  Laterality: N/A;   CORONARY STENT INTERVENTION N/A 12/26/2016   Procedure: CORONARY STENT INTERVENTION;  Surgeon: Millicent Ally, MD;  [facility];  Service: Cardiovascular;  Laterality: N/A;   CYSTOSCOPY/URETEROSCOPY/HOLMIUM LASER/STENT PLACEMENT  Right 01/07/2018   Procedure: RIGHT URETEROSCOPY/HOLMIUM LASER/STENT PLACEMENT;  Surgeon: Samson Croak, MD;  Location: [facility];  Service: Urology;  Laterality: Right;   CYSTOSCOPY/URETEROSCOPY/HOLMIUM LASER/STENT PLACEMENT Left 03/22/2022   Procedure: CYSTOSCOPY LEFT URETEROSCOPY/STENT PLACEMENT;  Surgeon: Samson Croak, MD;  [facility];  Service: Urology;  Laterality: Left;   ESOPHAGOGASTRODUODENOSCOPY (EGD) WITH PROPOFOL  N/A 01/12/2017   Procedure: ESOPHAGOGASTRODUODENOSCOPY (EGD) WITH PROPOFOL ;  Surgeon: Baldo Bonds, MD;  Location[facility];  Service: Endoscopy;  Laterality: N/A;   HAND TENDON SURGERY Right 10-29-2002   dr Donzella Galley @[facility]    right index and thumb   KNEE  ARTHROSCOPY Bilateral 2009-;2010   @[facility]    LEFT HEART CATH AND CORONARY ANGIOGRAPHY N/A 12/25/2016   Procedure: LEFT HEART CATH AND CORONARY ANGIOGRAPHY;  Surgeon: Odie Benne, MD;  [facility];  Service: Cardiovascular;  Laterality: N/A;   LEFT HEART CATH AND CORONARY ANGIOGRAPHY N/A 10/10/2019   Procedure: LEFT HEART CATH AND CORONARY ANGIOGRAPHY;  Surgeon: Millicent Ally, MD;  [facility];  Service: Cardiovascular;  Laterality: N/A;   LUMBAR LAMINECTOMY/DECOMPRESSION MICRODISCECTOMY N/A 06/09/2015   Procedure: Laminectomy - T12-L1;  Surgeon: Isadora Mar, MD;  [facility];  Service: Neurosurgery;  Laterality: N/A;  Laminectomy - T12-L1   MEDIAL PARTIAL KNEE REPLACEMENT Bilateral 2009-2010    [facility]    MELANOMA EXCISION Right    "side of my nose"   REVERSE SHOULDER ARTHROPLASTY Right 03/31/2021   Procedure: REVERSE SHOULDER ARTHROPLASTY;  Surgeon: Ellard Gunning, MD;  [facility];  Service: Orthopedics;  Laterality: Right;    SHOULDER SURGERY Left 1985   TOTAL SHOULDER ARTHROPLASTY Left 12/06/2012   Procedure: LEFT TOTAL SHOULDER ARTHROPLASTY VERSES A REVERSE TOTAL SHOULDER ARTHROPLASTY;  Surgeon: Lorriane Rote, MD;  [facility];  Service: Orthopedics;  Laterality: Left;   Patient Active Problem List   Diagnosis Date Noted   Acute CVA (cerebrovascular accident) (HCC) 03/20/2022   Normocytic anemia 10/05/2021   Iron deficiency anemia due to chronic blood loss 10/05/2021   Pain due to unicompartmental arthroplasty of knee (HCC) 08/25/2020   Benign prostatic hyperplasia 06/22/2020   Chronic kidney disease, stage 3 unspecified (HCC) 06/22/2020   Coronary artery disease 06/22/2020   Gastro-esophageal reflux disease without esophagitis 06/22/2020   Kidney stone 06/22/2020   Long term current use of insulin  (HCC) 06/22/2020   Polyneuropathy due to type 2 diabetes mellitus (HCC) 06/22/2020   Peripheral arterial  disease (HCC) 11/19/2019   RBBB 01/16/2017   PUD (peptic ulcer disease) 01/16/2017   Melena 01/12/2017   Unstable angina (HCC) 01/11/2017   CAD S/P percutaneous coronary angioplasty 01/09/2017   Mixed hyperlipidemia 01/09/2017   DM type 2 causing vascular disease (HCC) 12/24/2016   Essential hypertension, benign 12/24/2016   Chest pain, rule out acute myocardial infarction 12/24/2016   ACS (acute coronary syndrome) (HCC)    History of NSTEMI    Dysfunction of right eustachian tube 02/10/2016   Myelopathy (HCC) 06/09/2015    REFERRING PROVIDER: Audery Lean PA  REFERRING DIAG: Left hip ORIF  THERAPY DIAG:  Pain in left hip  Muscle weakness (generalized)  Rationale for Evaluation and Treatment: Rehabilitation  ONSET DATE: 06/05/23  SUBJECTIVE:   SUBJECTIVE STATEMENT: Pain about a 4.    PERTINENT HISTORY: Please see above.  Bilateral TKA's.   PAIN:  Are you having pain? Yes: NPRS scale: 4/10. Pain location: Left hip. Pain description: Sore. Aggravating factors: Rest. Relieving  factors: Moving.  PRECAUTIONS: Fall.  Patient needs to use FWW at all times.    RED FLAGS: None   WEIGHT BEARING RESTRICTIONS: No  FALLS:  Has patient fallen in last 6 months? Yes. Number of falls 6.  Now using walker.  LIVING ENVIRONMENT: Lives with: lives with their spouse Lives in: House/apartment Stairs: Ramp. Has following equipment at home: Otho Blitz - 2 wheeled  OCCUPATION: Retired.  PLOF: Independent with household mobility with device  PATIENT GOALS: Walk safely without left hip pain.    OBJECTIVE:  Note: Objective measures were completed at Evaluation unless otherwise noted.   PATIENT SURVEYS:  LEFS 19/80.   PALPATION: Patient pointing to area at mid-point of Aquacel.    LOWER EXTREMITY ROM:  Left knee -10 degrees and right is -15 degrees.  Left hip flexion to 95 degrees.  LOWER EXTREMITY MMT:  Patient performing a right SLR (part of his hospital d/c HEP)  and SAQ without assist.    GAIT: Safe gait with a FWW with a decrease in step and stride length.                                                                                                                                TREATMENT DATE:   07/16/23:                                     EXERCISE LOG  Exercise Repetitions and Resistance Comments  Nustep  Level 4 x 20 minutes   Ball squeezes 5 minutes    LAQ's 5# x 5 minutes   Hip abduction Green theraband x 5 minutes   Rockerboard 4 minutes     07/11/23:                                     EXERCISE LOG  Exercise Repetitions and Resistance Comments  Nustep Level 2 x 21 minutes.   Seated ball squeeze 5 minutes   LAQ's 5# x 5 minutes   Ham curls  Green theraband x 2 minutes   Seated hip abd Green theraband x 3 minutes   Rockerboard 5 minutes.                                      07/09/23 EXERCISE LOG  Exercise Repetitions and Resistance Comments  Nustep  L4-5 x 15 minutes @ seat 9   Seated hip ADD isometric  20  reps w/ 5 second hold   Seated HS curl Green t-band  3 x12  LLE only  Marching on foam     Rocker board  X 6 mins balance   LAQ 4# x 3 minutes  LLE only  Seated clams  Green t-band 3  x  10     Blank cell = exercise not performed today   06/27/23:  EXERCISE LOG       LT hip  Exercise Repetitions and Resistance Comments  Nustep  level 3 x 15 minutes.    Seated Marching 2# x 15 reps   Side stepping Balance beam  x 3 mins parallel bars   Heel ups/ Toe ups Unable to perform heelups due to weakness   Rocker board X 5 mins balance CGA   Mini-squats 2x15   LAQ's    Ball squeeze X 10 hold 10 secs   Clamshell  tband 3x10 with 3 sec hold   Seated heel ups x20   Seated PF With Red Tband  x 20    Blank cell = exercise not performed today   PATIENT EDUCATION:  Education details: healing, anatomy, and progress with physical therapy Person educated: Patient Education method: Explanation Education comprehension:  verbalized understanding  HOME EXERCISE PROGRAM: As above.  ASSESSMENT:  CLINICAL IMPRESSION: Patient did well with PT today.  He reports less soreness since his fall.  He has been using heat at home which has been helpful.    OBJECTIVE IMPAIRMENTS: decreased activity tolerance, decreased ROM, decreased strength, and pain.   ACTIVITY LIMITATIONS: carrying, lifting, bending, stairs, and locomotion level  PARTICIPATION LIMITATIONS: meal prep, cleaning, laundry, shopping, community activity, and yard work  PERSONAL FACTORS: 1-2 comorbidities: multiple co-morbidities are also affecting patient's functional outcome.   REHAB POTENTIAL: Good  CLINICAL DECISION MAKING: Stable/uncomplicated  EVALUATION COMPLEXITY: Low   GOALS:  SHORT TERM GOALS: Target date: 07/03/23.  Ind with an initial HEP. Goal status: MET   LONG TERM GOALS: Target date: 08/14/23  Ind with an advanced HEP.  Goal status: INITIAL  2.  Patient walk 1,000 feet with a FWW.  Goal status: INITIAL  3.  Left hip strength to 4+/5 to increase stability for functional tasks  Goal status: INITIAL  4.  Perform ADL's with pain not > 2-3/10.  Goal status: INITIAL  PLAN:  PT FREQUENCY: 15 visits per [place].    PLANNED INTERVENTIONS: 97110-Therapeutic exercises, 97530- Therapeutic activity, [id number]- Neuromuscular re-education, [phone]- Self Care, 95621- Manual therapy, [id number]- Electrical stimulation (unattended), Patient/Family education, and Cryotherapy  PLAN FOR NEXT SESSION: Nustep, gait activities, strengthening exercises.  No Modalities after 2 weeks   Jesten Cappuccio, [place], PT 07/16/2023, 10:19 AM

## 2023-07-19 ENCOUNTER — Ambulatory Visit: Admitting: Physical Therapy

## 2023-07-19 DIAGNOSIS — M25552 Pain in left hip: Secondary | ICD-10-CM

## 2023-07-19 DIAGNOSIS — M6281 Muscle weakness (generalized): Secondary | ICD-10-CM

## 2023-07-19 NOTE — Therapy (Signed)
 [facility] OUTPATIENT PHYSICAL THERAPY LOWER EXTREMITY TREATMENT   Patient Name: Matthew [name] MRN: [id number] DOB:[birth year], 83 y.o., male Today's Date: 07/19/2023  END OF SESSION:  PT End of Session - 07/19/23 1017     Visit Number 9    Number of Visits 15    Date for PT Re-Evaluation 08/14/23    PT Start Time 0930    PT Stop Time 1013    PT Time Calculation (min) 43 min    Activity Tolerance Patient tolerated treatment well    Behavior During Therapy [facility] for tasks assessed/performed            Past Medical History:  Diagnosis Date   Anticoagulated    plavix    Arthritis    Shoulder, knees, back    CAD in native artery cardiologist-  dr Abel Hoe   a. CAD/NSTEMI ,  cardiac cath staged stenting-- 12-25-2016  PTCA and DES x3 to prox. and mid LAD;  12-26-2016  PCI to PLA and DES x1 to midRCA,  EF 60-65%.   Chronic lower back pain    CKD (chronic kidney disease), stage III (HCC)    Complication of anesthesia    Too much with shoulder surgery, pt. reports that he was told the at they lost him, due to absorbing too much anesthesia.  shoulder surgery 1985   DDD (degenerative disc disease), lumbosacral    GERD (gastroesophageal reflux disease)    History of gastric ulcer 01/2017   History of kidney stones    History of malignant melanoma    right side of nose   History of non-ST elevation myocardial infarction (NSTEMI) 12/23/2017   s/p  staged cardiac cath,  s/p  PCI and DEStenting   Hyperlipidemia    Hypertension    IDA (iron deficiency anemia)    Iron deficiency anemia due to chronic blood loss 10/05/2021   Myocardial infarction (HCC)    x 2   Neuromuscular disorder (HCC)    neuropathy   Nocturia    RBBB    Renal calculus, right    S/P drug eluting coronary stent placement 12-25-2017,  12-26-2017   PTCA and DES x3 to prox. and mid LAD;  PCI to PLA and DES x1 to midRCA   Type 2 diabetes mellitus treated with insulin  (HCC)    followed by pcp   Past Surgical History:   Procedure Laterality Date   ANAL FISSURE REPAIR  X 2   BACK SURGERY     CATARACT EXTRACTION W/ INTRAOCULAR LENS  IMPLANT, BILATERAL Bilateral 2017;  2015   COLONOSCOPY WITH PROPOFOL  N/A 06/05/2016   Procedure: COLONOSCOPY WITH PROPOFOL ;  Surgeon: Garrett Kallman, MD;  Location: [facility];  Service: Endoscopy;  Laterality: N/A;   CONVERSION TO TOTAL KNEE Right 08/25/2020   Procedure: Revision right knee unicompartmental arthroplasty to total knee arthroplasty;  Surgeon: Liliane Rei, MD;  Location: [facility];  Service: Orthopedics;  Laterality: Right;   CORONARY ANGIOGRAPHY N/A 12/26/2016   Procedure: CORONARY ANGIOGRAPHY;  Surgeon: Millicent Ally, MD;  [facility];  Service: Cardiovascular;  Laterality: N/A;   CORONARY STENT INTERVENTION N/A 12/25/2016   Procedure: CORONARY STENT INTERVENTION;  Surgeon: Odie Benne, MD;  [facility];  Service: Cardiovascular;  Laterality: N/A;   CORONARY STENT INTERVENTION N/A 12/26/2016   Procedure: CORONARY STENT INTERVENTION;  Surgeon: Millicent Ally, MD;  [facility];  Service: Cardiovascular;  Laterality: N/A;   CYSTOSCOPY/URETEROSCOPY/HOLMIUM LASER/STENT PLACEMENT Right 01/07/2018  Procedure: RIGHT URETEROSCOPY/HOLMIUM LASER/STENT PLACEMENT;  Surgeon: Samson Croak, MD;  Location: [facility];  Service: Urology;  Laterality: Right;   CYSTOSCOPY/URETEROSCOPY/HOLMIUM LASER/STENT PLACEMENT Left 03/22/2022   Procedure: CYSTOSCOPY LEFT URETEROSCOPY/STENT PLACEMENT;  Surgeon: Samson Croak, MD;  Location: [facility];  Service: Urology;  Laterality: Left;   ESOPHAGOGASTRODUODENOSCOPY (EGD) WITH PROPOFOL  N/A 01/12/2017   Procedure: ESOPHAGOGASTRODUODENOSCOPY (EGD) WITH PROPOFOL ;  Surgeon: Baldo Bonds, MD;  Location: [facility] ENDOSCOPY;  Service: Endoscopy;  Laterality: N/A;   HAND TENDON SURGERY Right 10-29-2002   dr Donzella Galley @[facility]    right index and thumb   KNEE ARTHROSCOPY  Bilateral 2009-;2010   @[facility]    LEFT HEART CATH AND CORONARY ANGIOGRAPHY N/A 12/25/2016   Procedure: LEFT HEART CATH AND CORONARY ANGIOGRAPHY;  Surgeon: Odie Benne, MD;  [facility];  Service: Cardiovascular;  Laterality: N/A;   LEFT HEART CATH AND CORONARY ANGIOGRAPHY N/A 10/10/2019   Procedure: LEFT HEART CATH AND CORONARY ANGIOGRAPHY;  Surgeon: Millicent Ally, MD;  [facility];  Service: Cardiovascular;  Laterality: N/A;   LUMBAR LAMINECTOMY/DECOMPRESSION MICRODISCECTOMY N/A 06/09/2015   Procedure: Laminectomy - T12-L1;  Surgeon: Isadora Mar, MD;  [facility];  Service: Neurosurgery;  Laterality: N/A;  Laminectomy - T12-L1   MEDIAL PARTIAL KNEE REPLACEMENT Bilateral 2009-2010    [facility]    MELANOMA EXCISION Right    side of my nose   REVERSE SHOULDER ARTHROPLASTY Right 03/31/2021   Procedure: REVERSE SHOULDER ARTHROPLASTY;  Surgeon: Ellard Gunning, MD;  [facility]: [facility];  Service: Orthopedics;  Laterality: Right;    SHOULDER SURGERY Left 1985   TOTAL SHOULDER ARTHROPLASTY Left 12/06/2012   Procedure: LEFT TOTAL SHOULDER ARTHROPLASTY VERSES A REVERSE TOTAL SHOULDER ARTHROPLASTY;  Surgeon: Lorriane Rote, MD;  [facility];  Service: Orthopedics;  Laterality: Left;   Patient Active Problem List   Diagnosis Date Noted   Acute CVA (cerebrovascular accident) (HCC) 03/20/2022   Normocytic anemia 10/05/2021   Iron deficiency anemia due to chronic blood loss 10/05/2021   Pain due to unicompartmental arthroplasty of knee (HCC) 08/25/2020   Benign prostatic hyperplasia 06/22/2020   Chronic kidney disease, stage 3 unspecified (HCC) 06/22/2020   Coronary artery disease 06/22/2020   Gastro-esophageal reflux disease without esophagitis 06/22/2020   Kidney stone 06/22/2020   Long term current use of insulin  (HCC) 06/22/2020   Polyneuropathy due to type 2 diabetes mellitus (HCC) 06/22/2020   Peripheral arterial disease (HCC)  11/19/2019   RBBB 01/16/2017   PUD (peptic ulcer disease) 01/16/2017   Melena 01/12/2017   Unstable angina (HCC) 01/11/2017   CAD S/P percutaneous coronary angioplasty 01/09/2017   Mixed hyperlipidemia 01/09/2017   DM type 2 causing vascular disease (HCC) 12/24/2016   Essential hypertension, benign 12/24/2016   Chest pain, rule out acute myocardial infarction 12/24/2016   ACS (acute coronary syndrome) (HCC)    History of NSTEMI    Dysfunction of right eustachian tube 02/10/2016   Myelopathy (HCC) 06/09/2015    REFERRING PROVIDER: Audery Lean PA  REFERRING DIAG: Left hip ORIF  THERAPY DIAG:  Pain in left hip  Muscle weakness (generalized)  Rationale for Evaluation and Treatment: Rehabilitation  ONSET DATE: 06/05/23  SUBJECTIVE:   SUBJECTIVE STATEMENT: Pain about a 4 and sore.  PERTINENT HISTORY: Please see above.  Bilateral TKA's.   PAIN:  Are you having pain? Yes: NPRS scale: 4/10. Pain location: Left hip. Pain description: Sore. Aggravating factors: Rest. Relieving factors: Moving.  PRECAUTIONS:  Fall.  Patient needs to use FWW at all times.    RED FLAGS: None   WEIGHT BEARING RESTRICTIONS: No  FALLS:  Has patient fallen in last 6 months? Yes. Number of falls 6.  Now using walker.  LIVING ENVIRONMENT: Lives with: lives with their spouse Lives in: House/apartment Stairs: Ramp. Has following equipment at home: Otho Blitz - 2 wheeled  OCCUPATION: Retired.  PLOF: Independent with household mobility with device  PATIENT GOALS: Walk safely without left hip pain.    OBJECTIVE:  Note: Objective measures were completed at Evaluation unless otherwise noted.   PATIENT SURVEYS:  LEFS 19/80.   PALPATION: Patient pointing to area at mid-point of Aquacel.    LOWER EXTREMITY ROM:  Left knee -10 degrees and right is -15 degrees.  Left hip flexion to 95 degrees.  LOWER EXTREMITY MMT:  Patient performing a right SLR (part of his hospital d/c HEP) and SAQ  without assist.    GAIT: Safe gait with a FWW with a decrease in step and stride length.                                                                                                                                TREATMENT DATE:   Nustep level 4 x 20 minutes f/b ball squeezes x 5 minutes f/b patient in right sdly position with pillow between knees for comfort and received STW/M x 13 minutes to patient's left lateral hip musculature.  07/16/23:                                     EXERCISE LOG  Exercise Repetitions and Resistance Comments  Nustep  Level 4 x 20 minutes   Ball squeezes 5 minutes    LAQ's 5# x 5 minutes   Hip abduction Green theraband x 5 minutes   Rockerboard 4 minutes     07/11/23:                                     EXERCISE LOG  Exercise Repetitions and Resistance Comments  Nustep Level 2 x 21 minutes.   Seated ball squeeze 5 minutes   LAQ's 5# x 5 minutes   Ham curls  Green theraband x 2 minutes   Seated hip abd Green theraband x 3 minutes   Rockerboard 5 minutes.                                      07/09/23 EXERCISE LOG  Exercise Repetitions and Resistance Comments  Nustep  L4-5 x 15 minutes @ seat 9   Seated hip ADD isometric  20  reps w/ 5 second hold   Seated HS curl Green t-band  3 x12  LLE only  Marching on foam     Rocker board  X 6 mins balance   LAQ 4# x 3 minutes  LLE only  Seated clams  Green t-band 3  x  10     Blank cell = exercise not performed today   06/27/23:  EXERCISE LOG       LT hip  Exercise Repetitions and Resistance Comments  Nustep  level 3 x 15 minutes.    Seated Marching 2# x 15 reps   Side stepping Balance beam  x 3 mins parallel bars   Heel ups/ Toe ups Unable to perform heelups due to weakness   Rocker board X 5 mins balance CGA   Mini-squats 2x15   LAQ's    Ball squeeze X 10 hold 10 secs   Clamshell  tband 3x10 with 3 sec hold   Seated heel ups x20   Seated PF With Red Tband  x 20    Blank cell = exercise not  performed today   PATIENT EDUCATION:  Education details: healing, anatomy, and progress with physical therapy Person educated: Patient Education method: Explanation Education comprehension: verbalized understanding  HOME EXERCISE PROGRAM: As above.  ASSESSMENT:  CLINICAL IMPRESSION: Patient c/o left hip soreness.  He demonstrated increase tone and tenderness over his left lateral hip musculature.  He had a very good response to STW/M and felt better following treatment.  OBJECTIVE IMPAIRMENTS: decreased activity tolerance, decreased ROM, decreased strength, and pain.   ACTIVITY LIMITATIONS: carrying, lifting, bending, stairs, and locomotion level  PARTICIPATION LIMITATIONS: meal prep, cleaning, laundry, shopping, community activity, and yard work  PERSONAL FACTORS: 1-2 comorbidities: multiple co-morbidities are also affecting patient's functional outcome.   REHAB POTENTIAL: Good  CLINICAL DECISION MAKING: Stable/uncomplicated  EVALUATION COMPLEXITY: Low   GOALS:  SHORT TERM GOALS: Target date: 07/03/23.  Ind with an initial HEP. Goal status: MET   LONG TERM GOALS: Target date: 08/14/23  Ind with an advanced HEP.  Goal status: INITIAL  2.  Patient walk 1,000 feet with a FWW.  Goal status: INITIAL  3.  Left hip strength to 4+/5 to increase stability for functional tasks  Goal status: INITIAL  4.  Perform ADL's with pain not > 2-3/10.  Goal status: INITIAL  PLAN:  PT FREQUENCY: 15 visits per [place].    PLANNED INTERVENTIONS: 97110-Therapeutic exercises, 97530- Therapeutic activity, [id number]- Neuromuscular re-education, [phone]- Self Care, 60454- Manual therapy, [id number]- Electrical stimulation (unattended), Patient/Family education, and Cryotherapy  PLAN FOR NEXT SESSION: Nustep, gait activities, strengthening exercises.  No Modalities after 2 weeks   Keshayla Schrum, [place], PT 07/19/2023, 10:23 AM

## 2023-07-23 ENCOUNTER — Ambulatory Visit

## 2023-07-23 DIAGNOSIS — M6281 Muscle weakness (generalized): Secondary | ICD-10-CM

## 2023-07-23 DIAGNOSIS — M25552 Pain in left hip: Secondary | ICD-10-CM

## 2023-07-23 NOTE — Therapy (Addendum)
 OUTPATIENT PHYSICAL THERAPY LOWER EXTREMITY TREATMENT   Patient Name: Matthew [name] MRN: [id number] DOB:16-Oct-1940, 83 y.o., male Today's Date: 07/23/2023  END OF SESSION:  PT End of Session - 07/23/23 1019     Visit Number 10    Number of Visits 15    Date for PT Re-Evaluation 08/14/23    PT Start Time 1015    PT Stop Time 1100    PT Time Calculation (min) 45 min    Activity Tolerance Patient tolerated treatment well    Behavior During Therapy [facility] for tasks assessed/performed            Past Medical History:  Diagnosis Date   Anticoagulated    plavix    Arthritis    Shoulder, knees, back    CAD in native artery cardiologist-  dr Abel Hoe   a. CAD/NSTEMI ,  cardiac cath staged stenting-- 12-25-2016  PTCA and DES x3 to prox. and mid LAD;  12-26-2016  PCI to PLA and DES x1 to midRCA,  EF 60-65%.   Chronic lower back pain    CKD (chronic kidney disease), stage III (HCC)    Complication of anesthesia    Too much with shoulder surgery, pt. reports that he was told the at they lost him, due to absorbing too much anesthesia.  shoulder surgery 1985   DDD (degenerative disc disease), lumbosacral    GERD (gastroesophageal reflux disease)    History of gastric ulcer 01/2017   History of kidney stones    History of malignant melanoma    right side of nose   History of non-ST elevation myocardial infarction (NSTEMI) 12/23/2017   s/p  staged cardiac cath,  s/p  PCI and DEStenting   Hyperlipidemia    Hypertension    IDA (iron deficiency anemia)    Iron deficiency anemia due to chronic blood loss 10/05/2021   Myocardial infarction (HCC)    x 2   Neuromuscular disorder (HCC)    neuropathy   Nocturia    RBBB    Renal calculus, right    S/P drug eluting coronary stent placement 12-25-2017,  12-26-2017   PTCA and DES x3 to prox. and mid LAD;  PCI to PLA and DES x1 to midRCA   Type 2 diabetes mellitus treated with insulin  (HCC)    followed by pcp   Past Surgical History:   Procedure Laterality Date   ANAL FISSURE REPAIR  X 2   BACK SURGERY     CATARACT EXTRACTION W/ INTRAOCULAR LENS  IMPLANT, BILATERAL Bilateral 2017;  2015   COLONOSCOPY WITH PROPOFOL  N/A 06/05/2016   Procedure: COLONOSCOPY WITH PROPOFOL ;  Surgeon: Garrett Kallman, MD;  [facility];  Service: Endoscopy;  Laterality: N/A;   CONVERSION TO TOTAL KNEE Right 08/25/2020   Procedure: Revision right knee unicompartmental arthroplasty to total knee arthroplasty;  Surgeon: Liliane Rei, MD;  [facility]: [facility];  Service: Orthopedics;  Laterality: Right;   CORONARY ANGIOGRAPHY N/A 12/26/2016   Procedure: CORONARY ANGIOGRAPHY;  Surgeon: Millicent Ally, MD;  [facility];  Service: Cardiovascular;  Laterality: N/A;   CORONARY STENT INTERVENTION N/A 12/25/2016   Procedure: CORONARY STENT INTERVENTION;  Surgeon: Odie Benne, MD;  [facility];  Service: Cardiovascular;  Laterality: N/A;   CORONARY STENT INTERVENTION N/A 12/26/2016   Procedure: CORONARY STENT INTERVENTION;  Surgeon: Millicent Ally, MD;  [facility];  Service: Cardiovascular;  Laterality: N/A;   CYSTOSCOPY/URETEROSCOPY/HOLMIUM LASER/STENT PLACEMENT Right 01/07/2018  Procedure: RIGHT URETEROSCOPY/HOLMIUM LASER/STENT PLACEMENT;  Surgeon: Samson Croak, MD;  Location: [facility];  Service: Urology;  Laterality: Right;   CYSTOSCOPY/URETEROSCOPY/HOLMIUM LASER/STENT PLACEMENT Left 03/22/2022   Procedure: CYSTOSCOPY LEFT URETEROSCOPY/STENT PLACEMENT;  Surgeon: Samson Croak, MD;  [facility];  Service: Urology;  Laterality: Left;   ESOPHAGOGASTRODUODENOSCOPY (EGD) WITH PROPOFOL  N/A 01/12/2017   Procedure: ESOPHAGOGASTRODUODENOSCOPY (EGD) WITH PROPOFOL ;  Surgeon: Baldo Bonds, MD;  Location: [facility] ENDOSCOPY;  Service: Endoscopy;  Laterality: N/A;   HAND TENDON SURGERY Right 10-29-2002   dr Donzella Galley @[facility]    right index and thumb   KNEE ARTHROSCOPY  Bilateral 2009-;2010   @[facility]    LEFT HEART CATH AND CORONARY ANGIOGRAPHY N/A 12/25/2016   Procedure: LEFT HEART CATH AND CORONARY ANGIOGRAPHY;  Surgeon: Odie Benne, MD;  [facility];  Service: Cardiovascular;  Laterality: N/A;   LEFT HEART CATH AND CORONARY ANGIOGRAPHY N/A 10/10/2019   Procedure: LEFT HEART CATH AND CORONARY ANGIOGRAPHY;  Surgeon: Millicent Ally, MD;  [facility];  Service: Cardiovascular;  Laterality: N/A;   LUMBAR LAMINECTOMY/DECOMPRESSION MICRODISCECTOMY N/A 06/09/2015   Procedure: Laminectomy - T12-L1;  Surgeon: Isadora Mar, MD;  [facility];  Service: Neurosurgery;  Laterality: N/A;  Laminectomy - T12-L1   MEDIAL PARTIAL KNEE REPLACEMENT Bilateral 2009-2010    [facility]    MELANOMA EXCISION Right    side of my nose   REVERSE SHOULDER ARTHROPLASTY Right 03/31/2021   Procedure: REVERSE SHOULDER ARTHROPLASTY;  Surgeon: Ellard Gunning, MD;  [facility];  Service: Orthopedics;  Laterality: Right;    SHOULDER SURGERY Left 1985   TOTAL SHOULDER ARTHROPLASTY Left 12/06/2012   Procedure: LEFT TOTAL SHOULDER ARTHROPLASTY VERSES A REVERSE TOTAL SHOULDER ARTHROPLASTY;  Surgeon: Lorriane Rote, MD;  [facility];  Service: Orthopedics;  Laterality: Left;   Patient Active Problem List   Diagnosis Date Noted   Acute CVA (cerebrovascular accident) (HCC) 03/20/2022   Normocytic anemia 10/05/2021   Iron deficiency anemia due to chronic blood loss 10/05/2021   Pain due to unicompartmental arthroplasty of knee (HCC) 08/25/2020   Benign prostatic hyperplasia 06/22/2020   Chronic kidney disease, stage 3 unspecified (HCC) 06/22/2020   Coronary artery disease 06/22/2020   Gastro-esophageal reflux disease without esophagitis 06/22/2020   Kidney stone 06/22/2020   Long term current use of insulin  (HCC) 06/22/2020   Polyneuropathy due to type 2 diabetes mellitus (HCC) 06/22/2020   Peripheral arterial disease (HCC)  11/19/2019   RBBB 01/16/2017   PUD (peptic ulcer disease) 01/16/2017   Melena 01/12/2017   Unstable angina (HCC) 01/11/2017   CAD S/P percutaneous coronary angioplasty 01/09/2017   Mixed hyperlipidemia 01/09/2017   DM type 2 causing vascular disease (HCC) 12/24/2016   Essential hypertension, benign 12/24/2016   Chest pain, rule out acute myocardial infarction 12/24/2016   ACS (acute coronary syndrome) (HCC)    History of NSTEMI    Dysfunction of right eustachian tube 02/10/2016   Myelopathy (HCC) 06/09/2015    REFERRING PROVIDER: Audery Lean PA  REFERRING DIAG: Left hip ORIF  THERAPY DIAG:  Pain in left hip  Muscle weakness (generalized)  Rationale for Evaluation and Treatment: Rehabilitation  ONSET DATE: 06/05/23  SUBJECTIVE:   SUBJECTIVE STATEMENT: Pt reports 5/10 left hip pain.  Pt reports relief after last treatment session.   PERTINENT HISTORY: Please see above.  Bilateral TKA's.   PAIN:  Are you having pain? Yes: NPRS scale: 5/10. Pain location: Left hip. Pain description:  Sore. Aggravating factors: Rest. Relieving factors: Moving.  PRECAUTIONS: Fall.  Patient needs to use FWW at all times.    RED FLAGS: None   WEIGHT BEARING RESTRICTIONS: No  FALLS:  Has patient fallen in last 6 months? Yes. Number of falls 6.  Now using walker.  LIVING ENVIRONMENT: Lives with: lives with their spouse Lives in: House/apartment Stairs: Ramp. Has following equipment at home: Otho Blitz - 2 wheeled  OCCUPATION: Retired.  PLOF: Independent with household mobility with device  PATIENT GOALS: Walk safely without left hip pain.    OBJECTIVE:  Note: Objective measures were completed at Evaluation unless otherwise noted.   PATIENT SURVEYS:  LEFS 19/80.   PALPATION: Patient pointing to area at mid-point of Aquacel.    LOWER EXTREMITY ROM:  Left knee -10 degrees and right is -15 degrees.  Left hip flexion to 95 degrees.  LOWER EXTREMITY MMT:  Patient  performing a right SLR (part of his hospital d/c HEP) and SAQ without assist.    GAIT: Safe gait with a FWW with a decrease in step and stride length.                                                                                                                                TREATMENT DATE:   07/23/23:  Nustep level 4 x 20 minutes  f/b patient in right sdly position with pillow between knees for comfort and received STW/M to patient's left lateral hip musculature.  07/16/23:                                   EXERCISE LOG  Exercise Repetitions and Resistance Comments  Nustep  Level 4 x 20 minutes   Ball squeezes 5 minutes    LAQ's 5# x 5 minutes   Hip abduction Green theraband x 5 minutes   Rockerboard 4 minutes     07/11/23:                                   EXERCISE LOG  Exercise Repetitions and Resistance Comments  Nustep Level 2 x 21 minutes.   Seated ball squeeze 5 minutes   LAQ's 5# x 5 minutes   Ham curls  Green theraband x 2 minutes   Seated hip abd Green theraband x 3 minutes   Rockerboard 5 minutes.     PATIENT EDUCATION:  Education details: healing, anatomy, and progress with physical therapy Person educated: Patient Education method: Explanation Education comprehension: verbalized understanding  HOME EXERCISE PROGRAM: As above.  ASSESSMENT:  CLINICAL IMPRESSION: Pt arrives for today's treatment session reporting 5/10 left hip pain.  Pt reports decreased pain after last treatment session. Pt requesting STW today as it gave him so much relief at last session.  STW/M performed to left hip with numerous  trigger points noted.  Pt is making progress towards all of his goals at this time, but continues to be limited by pain.  Pt has MD appointment on Thursday.  Pt reported 2/10 left hip pain at completion of today's treatment session.   OBJECTIVE IMPAIRMENTS: decreased activity tolerance, decreased ROM, decreased strength, and pain.   ACTIVITY LIMITATIONS: carrying,  lifting, bending, stairs, and locomotion level  PARTICIPATION LIMITATIONS: meal prep, cleaning, laundry, shopping, community activity, and yard work  PERSONAL FACTORS: 1-2 comorbidities: multiple co-morbidities are also affecting patient's functional outcome.   REHAB POTENTIAL: Good  CLINICAL DECISION MAKING: Stable/uncomplicated  EVALUATION COMPLEXITY: Low   GOALS:  SHORT TERM GOALS: Target date: 07/03/23.  Ind with an initial HEP. Goal status: MET   LONG TERM GOALS: Target date: 08/14/23  Ind with an advanced HEP.  Goal status: IN PROGRESS  2.  Patient walk 1,000 feet with a FWW.  Goal status: IN PROGRESS  3.  Left hip strength to 4+/5 to increase stability for functional tasks  Goal status: IN PROGRESS  4.  Perform ADL's with pain not > 2-3/10.  Goal status: IN PROGRESS  PLAN:  PT FREQUENCY: 15 visits per [place].    PLANNED INTERVENTIONS: 97110-Therapeutic exercises, 97530- Therapeutic activity, [id number]- Neuromuscular re-education, [phone]- Self Care, 96295- Manual therapy, G0283- Electrical stimulation (unattended), Patient/Family education, and Cryotherapy  PLAN FOR NEXT SESSION: Nustep, gait activities, strengthening exercises.  No Modalities after 2 weeks   Deryl Flora, PTA 07/23/2023, 11:56 AM   Progress Note Reporting Period 06/19/23 to 07/23/23  See note below for Objective Data and Assessment of Progress/Goals. Patient is very motivated and progressing toward goals.    Chad Applegate MPT

## 2023-07-25 ENCOUNTER — Ambulatory Visit: Admitting: *Deleted

## 2023-07-25 ENCOUNTER — Encounter: Payer: Self-pay | Admitting: *Deleted

## 2023-07-25 DIAGNOSIS — M6281 Muscle weakness (generalized): Secondary | ICD-10-CM

## 2023-07-25 DIAGNOSIS — M25552 Pain in left hip: Secondary | ICD-10-CM | POA: Diagnosis not present

## 2023-07-25 NOTE — Therapy (Signed)
 OUTPATIENT PHYSICAL THERAPY LOWER EXTREMITY TREATMENT   Patient Name: Matthew [name] MRN: [id number] DOB:05-09-1940, 83 y.o., male Today's Date: 07/25/2023  END OF SESSION:  PT End of Session - 07/25/23 0756     Visit Number 11    Number of Visits 15    Date for PT Re-Evaluation 08/14/23    Authorization - Number of Visits 15     PT Start Time 0800    PT Stop Time 0850    PT Time Calculation (min) 50 min            Past Medical History:  Diagnosis Date   Anticoagulated    plavix    Arthritis    Shoulder, knees, back    CAD in native artery cardiologist-  dr Abel Hoe   a. CAD/NSTEMI ,  cardiac cath staged stenting-- 12-25-2016  PTCA and DES x3 to prox. and mid LAD;  12-26-2016  PCI to PLA and DES x1 to midRCA,  EF 60-65%.   Chronic lower back pain    CKD (chronic kidney disease), stage III (HCC)    Complication of anesthesia    Too much with shoulder surgery, pt. reports that he was told the at they lost him, due to absorbing too much anesthesia.  shoulder surgery 1985   DDD (degenerative disc disease), lumbosacral    GERD (gastroesophageal reflux disease)    History of gastric ulcer 01/2017   History of kidney stones    History of malignant melanoma    right side of nose   History of non-ST elevation myocardial infarction (NSTEMI) 12/23/2017   s/p  staged cardiac cath,  s/p  PCI and DEStenting   Hyperlipidemia    Hypertension    IDA (iron deficiency anemia)    Iron deficiency anemia due to chronic blood loss 10/05/2021   Myocardial infarction (HCC)    x 2   Neuromuscular disorder (HCC)    neuropathy   Nocturia    RBBB    Renal calculus, right    S/P drug eluting coronary stent placement 12-25-2017,  12-26-2017   PTCA and DES x3 to prox. and mid LAD;  PCI to PLA and DES x1 to midRCA   Type 2 diabetes mellitus treated with insulin  (HCC)    followed by pcp   Past Surgical History:  Procedure Laterality Date   ANAL FISSURE REPAIR  X 2   BACK SURGERY      CATARACT EXTRACTION W/ INTRAOCULAR LENS  IMPLANT, BILATERAL Bilateral 2017;  2015   COLONOSCOPY WITH PROPOFOL  N/A 06/05/2016   Procedure: COLONOSCOPY WITH PROPOFOL ;  Surgeon: Garrett Kallman, MD;  Location: [facility];  Service: Endoscopy;  Laterality: N/A;   CONVERSION TO TOTAL KNEE Right 08/25/2020   Procedure: Revision right knee unicompartmental arthroplasty to total knee arthroplasty;  Surgeon: Liliane Rei, MD;  Location: [facility];  Service: Orthopedics;  Laterality: Right;   CORONARY ANGIOGRAPHY N/A 12/26/2016   Procedure: CORONARY ANGIOGRAPHY;  Surgeon: Millicent Ally, MD;  [facility]: [facility];  Service: Cardiovascular;  Laterality: N/A;   CORONARY STENT INTERVENTION N/A 12/25/2016   Procedure: CORONARY STENT INTERVENTION;  Surgeon: Odie Benne, MD;  [facility]: [facility];  Service: Cardiovascular;  Laterality: N/A;   CORONARY STENT INTERVENTION N/A 12/26/2016   Procedure: CORONARY STENT INTERVENTION;  Surgeon: Millicent Ally, MD;  [facility];  Service: Cardiovascular;  Laterality: N/A;   CYSTOSCOPY/URETEROSCOPY/HOLMIUM LASER/STENT PLACEMENT Right 01/07/2018   Procedure: RIGHT URETEROSCOPY/HOLMIUM LASER/STENT PLACEMENT;  Surgeon: Parke Boll,  Willia Harries, MD;  Location: [facility];  Service: Urology;  Laterality: Right;   CYSTOSCOPY/URETEROSCOPY/HOLMIUM LASER/STENT PLACEMENT Left 03/22/2022   Procedure: CYSTOSCOPY LEFT URETEROSCOPY/STENT PLACEMENT;  Surgeon: Samson Croak, MD;  Location: [facility];  Service: Urology;  Laterality: Left;   ESOPHAGOGASTRODUODENOSCOPY (EGD) WITH PROPOFOL  N/A 01/12/2017   Procedure: ESOPHAGOGASTRODUODENOSCOPY (EGD) WITH PROPOFOL ;  Surgeon: Baldo Bonds, MD;  Location: [facility];  Service: Endoscopy;  Laterality: N/A;   HAND TENDON SURGERY Right 10-29-2002   dr Donzella Galley @[facility]    right index and thumb   KNEE ARTHROSCOPY Bilateral 2009-;2010   @[facility]    LEFT HEART CATH AND CORONARY ANGIOGRAPHY  N/A 12/25/2016   Procedure: LEFT HEART CATH AND CORONARY ANGIOGRAPHY;  Surgeon: Odie Benne, MD;  [facility];  Service: Cardiovascular;  Laterality: N/A;   LEFT HEART CATH AND CORONARY ANGIOGRAPHY N/A 10/10/2019   Procedure: LEFT HEART CATH AND CORONARY ANGIOGRAPHY;  Surgeon: Millicent Ally, MD;  [facility];  Service: Cardiovascular;  Laterality: N/A;   LUMBAR LAMINECTOMY/DECOMPRESSION MICRODISCECTOMY N/A 06/09/2015   Procedure: Laminectomy - T12-L1;  Surgeon: Isadora Mar, MD;  [facility];  Service: Neurosurgery;  Laterality: N/A;  Laminectomy - T12-L1   MEDIAL PARTIAL KNEE REPLACEMENT Bilateral 2009-2010    [facility]    MELANOMA EXCISION Right    side of my nose   REVERSE SHOULDER ARTHROPLASTY Right 03/31/2021   Procedure: REVERSE SHOULDER ARTHROPLASTY;  Surgeon: Ellard Gunning, MD;  Location: [facility];  Service: Orthopedics;  Laterality: Right;    SHOULDER SURGERY Left 1985   TOTAL SHOULDER ARTHROPLASTY Left 12/06/2012   Procedure: LEFT TOTAL SHOULDER ARTHROPLASTY VERSES A REVERSE TOTAL SHOULDER ARTHROPLASTY;  Surgeon: Lorriane Rote, MD;  [facility];  Service: Orthopedics;  Laterality: Left;   Patient Active Problem List   Diagnosis Date Noted   Acute CVA (cerebrovascular accident) (HCC) 03/20/2022   Normocytic anemia 10/05/2021   Iron deficiency anemia due to chronic blood loss 10/05/2021   Pain due to unicompartmental arthroplasty of knee (HCC) 08/25/2020   Benign prostatic hyperplasia 06/22/2020   Chronic kidney disease, stage 3 unspecified (HCC) 06/22/2020   Coronary artery disease 06/22/2020   Gastro-esophageal reflux disease without esophagitis 06/22/2020   Kidney stone 06/22/2020   Long term current use of insulin  (HCC) 06/22/2020   Polyneuropathy due to type 2 diabetes mellitus (HCC) 06/22/2020   Peripheral arterial disease (HCC) 11/19/2019   RBBB 01/16/2017   PUD (peptic ulcer disease) 01/16/2017    Melena 01/12/2017   Unstable angina (HCC) 01/11/2017   CAD S/P percutaneous coronary angioplasty 01/09/2017   Mixed hyperlipidemia 01/09/2017   DM type 2 causing vascular disease (HCC) 12/24/2016   Essential hypertension, benign 12/24/2016   Chest pain, rule out acute myocardial infarction 12/24/2016   ACS (acute coronary syndrome) (HCC)    History of NSTEMI    Dysfunction of right eustachian tube 02/10/2016   Myelopathy (HCC) 06/09/2015    REFERRING PROVIDER: Audery Lean PA  REFERRING DIAG: Left hip ORIF  THERAPY DIAG:  Pain in left hip  Muscle weakness (generalized)  Rationale for Evaluation and Treatment: Rehabilitation  ONSET DATE: 06/05/23  SUBJECTIVE:   SUBJECTIVE STATEMENT: Pt reports 2-5/10 left hip pain.  Pt reports relief after last treatment session and STW helps. See  DR. Aluisio tomorrow. Hip feels up to 60-65% better. I use walker more for balance than due to pain pain  PERTINENT HISTORY: Please see above.  Bilateral TKA's.  PAIN:  Are you having pain? Yes: NPRS scale: 2-5/10. Pain location: Left hip. Pain description: Sore. Aggravating factors: Rest. Relieving factors: Moving.  PRECAUTIONS: Fall.  Patient needs to use FWW at all times.    RED FLAGS: None   WEIGHT BEARING RESTRICTIONS: No  FALLS:  Has patient fallen in last 6 months? Yes. Number of falls 6.  Now using walker.  LIVING ENVIRONMENT: Lives with: lives with their spouse Lives in: House/apartment Stairs: Ramp. Has following equipment at home: Otho Blitz - 2 wheeled  OCCUPATION: Retired.  PLOF: Independent with household mobility with device  PATIENT GOALS: Walk safely without left hip pain.    OBJECTIVE:  Note: Objective measures were completed at Evaluation unless otherwise noted.   PATIENT SURVEYS:  LEFS 19/80.   PALPATION: Patient pointing to area at mid-point of Aquacel.    LOWER EXTREMITY ROM:  Left knee -10 degrees and right is -15 degrees.  Left hip flexion to  95 degrees.  LOWER EXTREMITY MMT:  Patient performing a right SLR (part of his hospital d/c HEP) and SAQ without assist.    GAIT: Safe gait with a FWW with a decrease in step and stride length.                                                                                                                                TREATMENT DATE:   07/25/23:  Nustep level 4 seat 9 x 20 minutes. Reviewed balance HEP to be performed for 4 mins 2x daily patient in right sdly position with pillow between knees for comfort and received STW/M to patient's left lateral hip musculature as well as scar mass/mobs  07/16/23:                                   EXERCISE LOG  Exercise Repetitions and Resistance Comments  Nustep  Level 4 x 20 minutes   Ball squeezes 5 minutes    LAQ's 5# x 5 minutes   Hip abduction Green theraband x 5 minutes   Rockerboard 4 minutes     07/11/23:                                   EXERCISE LOG  Exercise Repetitions and Resistance Comments  Nustep Level 2 x 21 minutes.   Seated ball squeeze 5 minutes   LAQ's 5# x 5 minutes   Ham curls  Green theraband x 2 minutes   Seated hip abd Green theraband x 3 minutes   Rockerboard 5 minutes.     PATIENT EDUCATION:  Education details: healing, anatomy, and progress with physical therapy Person educated: Patient Education method: Explanation Education comprehension: verbalized understanding  HOME EXERCISE PROGRAM: As above.  ASSESSMENT:  CLINICAL IMPRESSION: Pt arrives for today's treatment session reporting 2-5/10 left hip  pain at different times of the day. He reports being 60-65% better over all since starting PT and his cc now is pain right below the hip joint between incisions. He was able to continue with therex f/b manual STW to LT hip musculature as well as scar mobs. Notable soreness inferior aspect greater trochanter. We also reviewed HEP for balance.    OBJECTIVE IMPAIRMENTS: decreased activity tolerance,  decreased ROM, decreased strength, and pain.   ACTIVITY LIMITATIONS: carrying, lifting, bending, stairs, and locomotion level  PARTICIPATION LIMITATIONS: meal prep, cleaning, laundry, shopping, community activity, and yard work  PERSONAL FACTORS: 1-2 comorbidities: multiple co-morbidities are also affecting patient's functional outcome.   REHAB POTENTIAL: Good  CLINICAL DECISION MAKING: Stable/uncomplicated  EVALUATION COMPLEXITY: Low   GOALS:  SHORT TERM GOALS: Target date: 07/03/23.  Ind with an initial HEP. Goal status: MET   LONG TERM GOALS: Target date: 08/14/23  Ind with an advanced HEP.  Goal status: IN PROGRESS  2.  Patient walk 1,000 feet with a FWW.  Goal status: IN PROGRESS  3.  Left hip strength to 4+/5 to increase stability for functional tasks  Goal status: IN PROGRESS  4.  Perform ADL's with pain not > 2-3/10.  Goal status: IN PROGRESS  PLAN:  PT FREQUENCY: 15 visits per [place].    PLANNED INTERVENTIONS: 97110-Therapeutic exercises, 97530- Therapeutic activity, [id number]- Neuromuscular re-education, [phone]- Self Care, 60454- Manual therapy, [id number]- Electrical stimulation (unattended), Patient/Family education, and Cryotherapy  PLAN FOR NEXT SESSION: Nustep, gait activities, strengthening exercises.  No Modalities after 2 weeks   Blessen Kimbrough,CHRIS, PTA 07/25/2023, 8:57 AM

## 2023-07-30 ENCOUNTER — Encounter: Payer: Self-pay | Admitting: *Deleted

## 2023-07-30 ENCOUNTER — Ambulatory Visit: Admitting: *Deleted

## 2023-07-30 DIAGNOSIS — M25552 Pain in left hip: Secondary | ICD-10-CM | POA: Diagnosis not present

## 2023-07-30 DIAGNOSIS — M6281 Muscle weakness (generalized): Secondary | ICD-10-CM

## 2023-07-30 NOTE — Therapy (Signed)
 OUTPATIENT PHYSICAL THERAPY LOWER EXTREMITY TREATMENT   Patient Name: Matthew [name] MRN: [id number] DOB:1940/05/23, 83 y.o., male Today's Date: 07/30/2023  END OF SESSION:  PT End of Session - 07/30/23 1412     Visit Number 12    Number of Visits 15    Date for PT Re-Evaluation 08/14/23    Authorization - Number of Visits 15     PT Start Time 1345    PT Stop Time 1434    PT Time Calculation (min) 49 min            Past Medical History:  Diagnosis Date   Anticoagulated    plavix    Arthritis    Shoulder, knees, back    CAD in native artery cardiologist-  dr verlin   a. CAD/NSTEMI ,  cardiac cath staged stenting-- 12-25-2016  PTCA and DES x3 to prox. and mid LAD;  12-26-2016  PCI to PLA and DES x1 to midRCA,  EF 60-65%.   Chronic lower back pain    CKD (chronic kidney disease), stage III (HCC)    Complication of anesthesia    Too much with shoulder surgery, pt. reports that he was told the at they lost him, due to absorbing too much anesthesia.  shoulder surgery 1985   DDD (degenerative disc disease), lumbosacral    GERD (gastroesophageal reflux disease)    History of gastric ulcer 01/2017   History of kidney stones    History of malignant melanoma    right side of nose   History of non-ST elevation myocardial infarction (NSTEMI) 12/23/2017   s/p  staged cardiac cath,  s/p  PCI and DEStenting   Hyperlipidemia    Hypertension    IDA (iron deficiency anemia)    Iron deficiency anemia due to chronic blood loss 10/05/2021   Myocardial infarction (HCC)    x 2   Neuromuscular disorder (HCC)    neuropathy   Nocturia    RBBB    Renal calculus, right    S/P drug eluting coronary stent placement 12-25-2017,  12-26-2017   PTCA and DES x3 to prox. and mid LAD;  PCI to PLA and DES x1 to midRCA   Type 2 diabetes mellitus treated with insulin  (HCC)    followed by pcp   Past Surgical History:  Procedure Laterality Date   ANAL FISSURE REPAIR  X 2   BACK SURGERY      CATARACT EXTRACTION W/ INTRAOCULAR LENS  IMPLANT, BILATERAL Bilateral 2017;  2015   COLONOSCOPY WITH PROPOFOL  N/A 06/05/2016   Procedure: COLONOSCOPY WITH PROPOFOL ;  Surgeon: Gladis MARLA Louder, MD;  [facility]: [facility];  Service: Endoscopy;  Laterality: N/A;   CONVERSION TO TOTAL KNEE Right 08/25/2020   Procedure: Revision right knee unicompartmental arthroplasty to total knee arthroplasty;  Surgeon: Melodi Lerner, MD;  Location: [facility];  Service: Orthopedics;  Laterality: Right;   CORONARY ANGIOGRAPHY N/A 12/26/2016   Procedure: CORONARY ANGIOGRAPHY;  Surgeon: Burnard Debby LABOR, MD;  [facility];  Service: Cardiovascular;  Laterality: N/A;   CORONARY STENT INTERVENTION N/A 12/25/2016   Procedure: CORONARY STENT INTERVENTION;  Surgeon: verlin Lonni BIRCH, MD;  [facility];  Service: Cardiovascular;  Laterality: N/A;   CORONARY STENT INTERVENTION N/A 12/26/2016   Procedure: CORONARY STENT INTERVENTION;  Surgeon: Burnard Debby LABOR, MD;  [facility];  Service: Cardiovascular;  Laterality: N/A;   CYSTOSCOPY/URETEROSCOPY/HOLMIUM LASER/STENT PLACEMENT Right 01/07/2018   Procedure: RIGHT URETEROSCOPY/HOLMIUM LASER/STENT PLACEMENT;  Surgeon: Carolee,  Sherwood JONETTA MOULD, MD;  [facility];  Service: Urology;  Laterality: Right;   CYSTOSCOPY/URETEROSCOPY/HOLMIUM LASER/STENT PLACEMENT Left 03/22/2022   Procedure: CYSTOSCOPY LEFT URETEROSCOPY/STENT PLACEMENT;  Surgeon: Carolee Sherwood JONETTA MOULD, MD;  Location: [facility];  Service: Urology;  Laterality: Left;   ESOPHAGOGASTRODUODENOSCOPY (EGD) WITH PROPOFOL  N/A 01/12/2017   Procedure: ESOPHAGOGASTRODUODENOSCOPY (EGD) WITH PROPOFOL ;  Surgeon: Dianna Specking, MD;  Location: [facility];  Service: Endoscopy;  Laterality: N/A;   HAND TENDON SURGERY Right 10-29-2002   dr sissy @[facility]    right index and thumb   KNEE ARTHROSCOPY Bilateral 2009-;2010   @[facility]    LEFT HEART CATH AND CORONARY ANGIOGRAPHY  N/A 12/25/2016   Procedure: LEFT HEART CATH AND CORONARY ANGIOGRAPHY;  Surgeon: Verlin Lonni JONETTA, MD;  [facility];  Service: Cardiovascular;  Laterality: N/A;   LEFT HEART CATH AND CORONARY ANGIOGRAPHY N/A 10/10/2019   Procedure: LEFT HEART CATH AND CORONARY ANGIOGRAPHY;  Surgeon: Burnard Debby LABOR, MD;  [facility];  Service: Cardiovascular;  Laterality: N/A;   LUMBAR LAMINECTOMY/DECOMPRESSION MICRODISCECTOMY N/A 06/09/2015   Procedure: Laminectomy - T12-L1;  Surgeon: Alm GORMAN Molt, MD;  [facility];  Service: Neurosurgery;  Laterality: N/A;  Laminectomy - T12-L1   MEDIAL PARTIAL KNEE REPLACEMENT Bilateral 2009-2010    [facility]    MELANOMA EXCISION Right    side of my nose   REVERSE SHOULDER ARTHROPLASTY Right 03/31/2021   Procedure: REVERSE SHOULDER ARTHROPLASTY;  Surgeon: Melita Drivers, MD;  Location: [facility];  Service: Orthopedics;  Laterality: Right;    SHOULDER SURGERY Left 1985   TOTAL SHOULDER ARTHROPLASTY Left 12/06/2012   Procedure: LEFT TOTAL SHOULDER ARTHROPLASTY VERSES A REVERSE TOTAL SHOULDER ARTHROPLASTY;  Surgeon: Elspeth JONELLE Her, MD;  [facility];  Service: Orthopedics;  Laterality: Left;   Patient Active Problem List   Diagnosis Date Noted   Acute CVA (cerebrovascular accident) (HCC) 03/20/2022   Normocytic anemia 10/05/2021   Iron deficiency anemia due to chronic blood loss 10/05/2021   Pain due to unicompartmental arthroplasty of knee (HCC) 08/25/2020   Benign prostatic hyperplasia 06/22/2020   Chronic kidney disease, stage 3 unspecified (HCC) 06/22/2020   Coronary artery disease 06/22/2020   Gastro-esophageal reflux disease without esophagitis 06/22/2020   Kidney stone 06/22/2020   Long term current use of insulin  (HCC) 06/22/2020   Polyneuropathy due to type 2 diabetes mellitus (HCC) 06/22/2020   Peripheral arterial disease (HCC) 11/19/2019   RBBB 01/16/2017   PUD (peptic ulcer disease) 01/16/2017    Melena 01/12/2017   Unstable angina (HCC) 01/11/2017   CAD S/P percutaneous coronary angioplasty 01/09/2017   Mixed hyperlipidemia 01/09/2017   DM type 2 causing vascular disease (HCC) 12/24/2016   Essential hypertension, benign 12/24/2016   Chest pain, rule out acute myocardial infarction 12/24/2016   ACS (acute coronary syndrome) (HCC)    History of NSTEMI    Dysfunction of right eustachian tube 02/10/2016   Myelopathy (HCC) 06/09/2015    REFERRING PROVIDER: Clotilda Mail PA  REFERRING DIAG: Left hip ORIF  THERAPY DIAG:  Pain in left hip  Muscle weakness (generalized)  Rationale for Evaluation and Treatment: Rehabilitation  ONSET DATE: 06/05/23  SUBJECTIVE:   SUBJECTIVE STATEMENT: Pt reports 2-5/10 left hip pain today. MD said to continue for 4 weeks if [facility] more. Pt reports relief after last treatment session and STW helps.  PERTINENT HISTORY: Please see above.  Bilateral TKA's.   PAIN:  Are you having pain? Yes: NPRS scale: 2-5/10.  Pain location: Left hip. Pain description: Sore. Aggravating factors: Rest. Relieving factors: Moving.  PRECAUTIONS: Fall.  Patient needs to use FWW at all times.    RED FLAGS: None   WEIGHT BEARING RESTRICTIONS: No  FALLS:  Has patient fallen in last 6 months? Yes. Number of falls 6.  Now using walker.  LIVING ENVIRONMENT: Lives with: lives with their spouse Lives in: House/apartment Stairs: Ramp. Has following equipment at home: Vannie - 2 wheeled  OCCUPATION: Retired.  PLOF: Independent with household mobility with device  PATIENT GOALS: Walk safely without left hip pain.    OBJECTIVE:  Note: Objective measures were completed at Evaluation unless otherwise noted.   PATIENT SURVEYS:  LEFS 19/80.   PALPATION: Patient pointing to area at mid-point of Aquacel.    LOWER EXTREMITY ROM:  Left knee -10 degrees and right is -15 degrees.  Left hip flexion to 95 degrees.  LOWER EXTREMITY MMT:  Patient  performing a right SLR (part of his hospital d/c HEP) and SAQ without assist.    GAIT: Safe gait with a FWW with a decrease in step and stride length.                                                                                                                                TREATMENT DATE:   07/30/23:  Nustep level 4 seat 9 x . balance  tandem stance in // bars  Gait with SPC in clinic SBA Manual:  patient in right sdly position with pillow between knees for comfort and received STW/M to patient's left lateral hip musculature as well as scar mass/mobs  07/16/23:                                   EXERCISE LOG  Exercise Repetitions and Resistance Comments  Nustep  Level 4 x 20 minutes   Ball squeezes 5 minutes    LAQ's 5# x 5 minutes   Hip abduction Green theraband x 5 minutes   Rockerboard 4 minutes     07/11/23:                                   EXERCISE LOG  Exercise Repetitions and Resistance Comments  Nustep Level 2 x 21 minutes.   Seated ball squeeze 5 minutes   LAQ's 5# x 5 minutes   Ham curls  Green theraband x 2 minutes   Seated hip abd Green theraband x 3 minutes   Rockerboard 5 minutes.     PATIENT EDUCATION:  Education details: healing, anatomy, and progress with physical therapy Person educated: Patient Education method: Explanation Education comprehension: verbalized understanding  HOME EXERCISE PROGRAM: As above.  ASSESSMENT:  CLINICAL IMPRESSION: Pt arrives for today's treatment session reporting 2-5/10 left hip pain at different times of the  day. He reports MD wants him to cont. PT x 4 more weeks if [facility] will [facility]. Rx focused on therex, balance as well as STW to LT hip musculature. MD said the pain could be coming from one of the screws in the hip    OBJECTIVE IMPAIRMENTS: decreased activity tolerance, decreased ROM, decreased strength, and pain.   ACTIVITY LIMITATIONS: carrying, lifting, bending, stairs, and locomotion  level  PARTICIPATION LIMITATIONS: meal prep, cleaning, laundry, shopping, community activity, and yard work  PERSONAL FACTORS: 1-2 comorbidities: multiple co-morbidities are also affecting patient's functional outcome.   REHAB POTENTIAL: Good  CLINICAL DECISION MAKING: Stable/uncomplicated  EVALUATION COMPLEXITY: Low   GOALS:  SHORT TERM GOALS: Target date: 07/03/23.  Ind with an initial HEP. Goal status: MET   LONG TERM GOALS: Target date: 08/14/23  Ind with an advanced HEP.  Goal status: IN PROGRESS  2.  Patient walk 1,000 feet with a FWW.  Goal status: IN PROGRESS  3.  Left hip strength to 4+/5 to increase stability for functional tasks  Goal status: IN PROGRESS  4.  Perform ADL's with pain not > 2-3/10.  Goal status: IN PROGRESS  PLAN:  PT FREQUENCY: 15 visits per [place].    PLANNED INTERVENTIONS: 97110-Therapeutic exercises, 97530- Therapeutic activity, [id number]- Neuromuscular re-education, [phone]- Self Care, [id number]- Manual therapy, [id number]- Electrical stimulation (unattended), Patient/Family education, and Cryotherapy  PLAN FOR NEXT SESSION: Nustep, gait activities, strengthening exercises.  No Modalities after 2 weeks   Lore Polka,CHRIS, PTA 07/30/2023, 2:47 PM

## 2023-08-06 ENCOUNTER — Encounter: Admitting: *Deleted

## 2023-08-06 ENCOUNTER — Ambulatory Visit

## 2023-08-06 DIAGNOSIS — M25552 Pain in left hip: Secondary | ICD-10-CM

## 2023-08-06 DIAGNOSIS — M6281 Muscle weakness (generalized): Secondary | ICD-10-CM

## 2023-08-06 NOTE — Therapy (Signed)
 OUTPATIENT PHYSICAL THERAPY LOWER EXTREMITY TREATMENT   Patient Name: Matthew [name] MRN: [id number] DOB:06-17-1940, 83 y.o., male Today's Date: 08/06/2023  END OF SESSION:  PT End of Session - 08/06/23 1305     Visit Number 13    Number of Visits 15    Date for PT Re-Evaluation 08/14/23    Authorization - Number of Visits 15    PT Start Time 1300    PT Stop Time 1344    PT Time Calculation (min) 44 min    Activity Tolerance Patient tolerated treatment well    Behavior During Therapy [facility] for tasks assessed/performed             Past Medical History:  Diagnosis Date   Anticoagulated    plavix    Arthritis    Shoulder, knees, back    CAD in native artery cardiologist-  dr verlin   a. CAD/NSTEMI ,  cardiac cath staged stenting-- 12-25-2016  PTCA and DES x3 to prox. and mid LAD;  12-26-2016  PCI to PLA and DES x1 to midRCA,  EF 60-65%.   Chronic lower back pain    CKD (chronic kidney disease), stage III (HCC)    Complication of anesthesia    Too much with shoulder surgery, pt. reports that he was told the at they lost him, due to absorbing too much anesthesia.  shoulder surgery 1985   DDD (degenerative disc disease), lumbosacral    GERD (gastroesophageal reflux disease)    History of gastric ulcer 01/2017   History of kidney stones    History of malignant melanoma    right side of nose   History of non-ST elevation myocardial infarction (NSTEMI) 12/23/2017   s/p  staged cardiac cath,  s/p  PCI and DEStenting   Hyperlipidemia    Hypertension    IDA (iron deficiency anemia)    Iron deficiency anemia due to chronic blood loss 10/05/2021   Myocardial infarction (HCC)    x 2   Neuromuscular disorder (HCC)    neuropathy   Nocturia    RBBB    Renal calculus, right    S/P drug eluting coronary stent placement 12-25-2017,  12-26-2017   PTCA and DES x3 to prox. and mid LAD;  PCI to PLA and DES x1 to midRCA   Type 2 diabetes mellitus treated with insulin  (HCC)     followed by pcp   Past Surgical History:  Procedure Laterality Date   ANAL FISSURE REPAIR  X 2   BACK SURGERY     CATARACT EXTRACTION W/ INTRAOCULAR LENS  IMPLANT, BILATERAL Bilateral 2017;  2015   COLONOSCOPY WITH PROPOFOL  N/A 06/05/2016   Procedure: COLONOSCOPY WITH PROPOFOL ;  Surgeon: Gladis MARLA Louder, MD;  Location: [facility];  Service: Endoscopy;  Laterality: N/A;   CONVERSION TO TOTAL KNEE Right 08/25/2020   Procedure: Revision right knee unicompartmental arthroplasty to total knee arthroplasty;  Surgeon: Melodi Lerner, MD;  Location: [facility];  Service: Orthopedics;  Laterality: Right;   CORONARY ANGIOGRAPHY N/A 12/26/2016   Procedure: CORONARY ANGIOGRAPHY;  Surgeon: Burnard Debby LABOR, MD;  [facility];  Service: Cardiovascular;  Laterality: N/A;   CORONARY STENT INTERVENTION N/A 12/25/2016   Procedure: CORONARY STENT INTERVENTION;  Surgeon: verlin Lonni BIRCH, MD;  [facility]: [facility];  Service: Cardiovascular;  Laterality: N/A;   CORONARY STENT INTERVENTION N/A 12/26/2016   Procedure: CORONARY STENT INTERVENTION;  Surgeon: Burnard Debby LABOR, MD;  [facility];  Service: Cardiovascular;  Laterality: N/A;   CYSTOSCOPY/URETEROSCOPY/HOLMIUM LASER/STENT PLACEMENT Right 01/07/2018   Procedure: RIGHT URETEROSCOPY/HOLMIUM LASER/STENT PLACEMENT;  Surgeon: Carolee Sherwood JONETTA DOUGLAS, MD;  Location: [facility];  Service: Urology;  Laterality: Right;   CYSTOSCOPY/URETEROSCOPY/HOLMIUM LASER/STENT PLACEMENT Left 03/22/2022   Procedure: CYSTOSCOPY LEFT URETEROSCOPY/STENT PLACEMENT;  Surgeon: Carolee Sherwood JONETTA DOUGLAS, MD;  Location: [facility];  Service: Urology;  Laterality: Left;   ESOPHAGOGASTRODUODENOSCOPY (EGD) WITH PROPOFOL  N/A 01/12/2017   Procedure: ESOPHAGOGASTRODUODENOSCOPY (EGD) WITH PROPOFOL ;  Surgeon: Dianna Specking, MD;  Location: [facility]COPY;  Service: Endoscopy;  Laterality: N/A;   HAND TENDON SURGERY Right 10-29-2002   dr sissy @[facility]     right index and thumb   KNEE ARTHROSCOPY Bilateral 2009-;2010   @[facility]    LEFT HEART CATH AND CORONARY ANGIOGRAPHY N/A 12/25/2016   Procedure: LEFT HEART CATH AND CORONARY ANGIOGRAPHY;  Surgeon: Verlin Lonni JONETTA, MD;  [facility];  Service: Cardiovascular;  Laterality: N/A;   LEFT HEART CATH AND CORONARY ANGIOGRAPHY N/A 10/10/2019   Procedure: LEFT HEART CATH AND CORONARY ANGIOGRAPHY;  Surgeon: Burnard Debby LABOR, MD;  [facility];  Service: Cardiovascular;  Laterality: N/A;   LUMBAR LAMINECTOMY/DECOMPRESSION MICRODISCECTOMY N/A 06/09/2015   Procedure: Laminectomy - T12-L1;  Surgeon: Alm GORMAN Molt, MD;  [facility];  Service: Neurosurgery;  Laterality: N/A;  Laminectomy - T12-L1   MEDIAL PARTIAL KNEE REPLACEMENT Bilateral 2009-2010    [facility]    MELANOMA EXCISION Right    side of my nose   REVERSE SHOULDER ARTHROPLASTY Right 03/31/2021   Procedure: REVERSE SHOULDER ARTHROPLASTY;  Surgeon: Melita Drivers, MD;  Location: [facility];  Service: Orthopedics;  Laterality: Right;    SHOULDER SURGERY Left 1985   TOTAL SHOULDER ARTHROPLASTY Left 12/06/2012   Procedure: LEFT TOTAL SHOULDER ARTHROPLASTY VERSES A REVERSE TOTAL SHOULDER ARTHROPLASTY;  Surgeon: Elspeth JONELLE Her, MD;  [facility];  Service: Orthopedics;  Laterality: Left;   Patient Active Problem List   Diagnosis Date Noted   Acute CVA (cerebrovascular accident) (HCC) 03/20/2022   Normocytic anemia 10/05/2021   Iron deficiency anemia due to chronic blood loss 10/05/2021   Pain due to unicompartmental arthroplasty of knee (HCC) 08/25/2020   Benign prostatic hyperplasia 06/22/2020   Chronic kidney disease, stage 3 unspecified (HCC) 06/22/2020   Coronary artery disease 06/22/2020   Gastro-esophageal reflux disease without esophagitis 06/22/2020   Kidney stone 06/22/2020   Long term current use of insulin  (HCC) 06/22/2020   Polyneuropathy due to type 2 diabetes mellitus (HCC)  06/22/2020   Peripheral arterial disease (HCC) 11/19/2019   RBBB 01/16/2017   PUD (peptic ulcer disease) 01/16/2017   Melena 01/12/2017   Unstable angina (HCC) 01/11/2017   CAD S/P percutaneous coronary angioplasty 01/09/2017   Mixed hyperlipidemia 01/09/2017   DM type 2 causing vascular disease (HCC) 12/24/2016   Essential hypertension, benign 12/24/2016   Chest pain, rule out acute myocardial infarction 12/24/2016   ACS (acute coronary syndrome) (HCC)    History of NSTEMI    Dysfunction of right eustachian tube 02/10/2016   Myelopathy (HCC) 06/09/2015    REFERRING PROVIDER: Clotilda Mail PA  REFERRING DIAG: Left hip ORIF  THERAPY DIAG:  Pain in left hip  Muscle weakness (generalized)  Rationale for Evaluation and Treatment: Rehabilitation  ONSET DATE: 06/05/23  SUBJECTIVE:   SUBJECTIVE STATEMENT: Patient reports that he feels better today. He is using his cane at home, but still uses his walker in public.   PERTINENT HISTORY: Please see above.  Bilateral TKA's.  PAIN:  Are you having pain? Yes: NPRS scale: no pain score provided. Pain location: Left hip. Pain description: Sore. Aggravating factors: Rest. Relieving factors: Moving.  PRECAUTIONS: Fall.  Patient needs to use FWW at all times.    RED FLAGS: None   WEIGHT BEARING RESTRICTIONS: No  FALLS:  Has patient fallen in last 6 months? Yes. Number of falls 6.  Now using walker.  LIVING ENVIRONMENT: Lives with: lives with their spouse Lives in: House/apartment Stairs: Ramp. Has following equipment at home: Vannie - 2 wheeled  OCCUPATION: Retired.  PLOF: Independent with household mobility with device  PATIENT GOALS: Walk safely without left hip pain.    OBJECTIVE:  Note: Objective measures were completed at Evaluation unless otherwise noted.   PATIENT SURVEYS:  LEFS 19/80.   PALPATION: Patient pointing to area at mid-point of Aquacel.    LOWER EXTREMITY ROM:  Left knee -10 degrees and  right is -15 degrees.  Left hip flexion to 95 degrees.  LOWER EXTREMITY MMT:  Patient performing a right SLR (part of his hospital d/c HEP) and SAQ without assist.    GAIT: Safe gait with a FWW with a decrease in step and stride length.                                                                                                                                TREATMENT DATE:                                   08/06/23 EXERCISE LOG  Exercise Repetitions and Resistance Comments  Nustep  L4 x 15 minutes    Rocker board  4 minutes    Static stance on foam  2 minutes Narrow BOS; intermittent UE support from parallel bars  Side stepping  3 minutes   Manual therapy  See below    Blank cell = exercise not performed today  Manual Therapy Soft Tissue Mobilization: left TFL and IT band, for reduced pain and tone   07/30/23:  Nustep level 4 seat 9 x . balance  tandem stance in // bars  Gait with SPC in clinic SBA Manual:  patient in right sdly position with pillow between knees for comfort and received STW/M to patient's left lateral hip musculature as well as scar mass/mobs  07/16/23:                                   EXERCISE LOG  Exercise Repetitions and Resistance Comments  Nustep  Level 4 x 20 minutes   Ball squeezes 5 minutes    LAQ's 5# x 5 minutes   Hip abduction Green theraband x 5 minutes   Rockerboard 4 minutes    PATIENT EDUCATION:  Education details: healing, anatomy, and progress with physical therapy Person educated: Patient  Education method: Explanation Education comprehension: verbalized understanding  HOME EXERCISE PROGRAM: As above.  ASSESSMENT:  CLINICAL IMPRESSION: Today's treatment focused on standing interventions for improved static and dynamic stability. He required minimal cueing with today's new interventions for proper exercise performance. Manual therapy focused on soft tissue mobilization to the left IT band and TFL for reduced pain and tone  with good results. He reported feeling better upon the conclusion of treatment. He continues to require skilled physical therapy to address his remaining impairments to return to his prior level of function.   OBJECTIVE IMPAIRMENTS: decreased activity tolerance, decreased ROM, decreased strength, and pain.   ACTIVITY LIMITATIONS: carrying, lifting, bending, stairs, and locomotion level  PARTICIPATION LIMITATIONS: meal prep, cleaning, laundry, shopping, community activity, and yard work  PERSONAL FACTORS: 1-2 comorbidities: multiple co-morbidities are also affecting patient's functional outcome.   REHAB POTENTIAL: Good  CLINICAL DECISION MAKING: Stable/uncomplicated  EVALUATION COMPLEXITY: Low   GOALS:  SHORT TERM GOALS: Target date: 07/03/23.  Ind with an initial HEP. Goal status: MET   LONG TERM GOALS: Target date: 08/14/23  Ind with an advanced HEP.  Goal status: IN PROGRESS  2.  Patient walk 1,000 feet with a FWW.  Goal status: IN PROGRESS  3.  Left hip strength to 4+/5 to increase stability for functional tasks  Goal status: IN PROGRESS  4.  Perform ADL's with pain not > 2-3/10.  Goal status: IN PROGRESS  PLAN:  PT FREQUENCY: 15 visits per [place].    PLANNED INTERVENTIONS: 97110-Therapeutic exercises, 97530- Therapeutic activity, [id number]- Neuromuscular re-education, [phone]- Self Care, 02859- Manual therapy, G0283- Electrical stimulation (unattended), Patient/Family education, and Cryotherapy  PLAN FOR NEXT SESSION: Nustep, gait activities, strengthening exercises.  No Modalities after 2 weeks   Lacinda JAYSON Fass, PT 08/06/2023, 2:01 PM

## 2023-08-14 ENCOUNTER — Ambulatory Visit: Attending: Physician Assistant

## 2023-08-14 DIAGNOSIS — M6281 Muscle weakness (generalized): Secondary | ICD-10-CM | POA: Diagnosis present

## 2023-08-14 DIAGNOSIS — M25552 Pain in left hip: Secondary | ICD-10-CM | POA: Diagnosis present

## 2023-08-14 NOTE — Therapy (Signed)
 OUTPATIENT PHYSICAL THERAPY LOWER EXTREMITY TREATMENT   Patient Name: Matthew [name] MRN: [id number] DOB:1940-11-01, 83 y.o., male Today's Date: 08/14/2023  END OF SESSION:  PT End of Session - 08/14/23 1020     Visit Number 14    Number of Visits 15    Date for PT Re-Evaluation 08/14/23    Authorization - Number of Visits 15    PT Start Time 1015    PT Stop Time 1100    PT Time Calculation (min) 45 min    Activity Tolerance Patient tolerated treatment well    Behavior During Therapy [facility] for tasks assessed/performed             Past Medical History:  Diagnosis Date   Anticoagulated    plavix    Arthritis    Shoulder, knees, back    CAD in native artery cardiologist-  dr verlin   a. CAD/NSTEMI ,  cardiac cath staged stenting-- 12-25-2016  PTCA and DES x3 to prox. and mid LAD;  12-26-2016  PCI to PLA and DES x1 to midRCA,  EF 60-65%.   Chronic lower back pain    CKD (chronic kidney disease), stage III (HCC)    Complication of anesthesia    Too much with shoulder surgery, pt. reports that he was told the at they lost him, due to absorbing too much anesthesia.  shoulder surgery 1985   DDD (degenerative disc disease), lumbosacral    GERD (gastroesophageal reflux disease)    History of gastric ulcer 01/2017   History of kidney stones    History of malignant melanoma    right side of nose   History of non-ST elevation myocardial infarction (NSTEMI) 12/23/2017   s/p  staged cardiac cath,  s/p  PCI and DEStenting   Hyperlipidemia    Hypertension    IDA (iron deficiency anemia)    Iron deficiency anemia due to chronic blood loss 10/05/2021   Myocardial infarction (HCC)    x 2   Neuromuscular disorder (HCC)    neuropathy   Nocturia    RBBB    Renal calculus, right    S/P drug eluting coronary stent placement 12-25-2017,  12-26-2017   PTCA and DES x3 to prox. and mid LAD;  PCI to PLA and DES x1 to midRCA   Type 2 diabetes mellitus treated with insulin  (HCC)     followed by pcp   Past Surgical History:  Procedure Laterality Date   ANAL FISSURE REPAIR  X 2   BACK SURGERY     CATARACT EXTRACTION W/ INTRAOCULAR LENS  IMPLANT, BILATERAL Bilateral 2017;  2015   COLONOSCOPY WITH PROPOFOL  N/A 06/05/2016   Procedure: COLONOSCOPY WITH PROPOFOL ;  Surgeon: Gladis MARLA Louder, MD;  [facility];  Service: Endoscopy;  Laterality: N/A;   CONVERSION TO TOTAL KNEE Right 08/25/2020   Procedure: Revision right knee unicompartmental arthroplasty to total knee arthroplasty;  Surgeon: Melodi Lerner, MD;  [facility]: [facility];  Service: Orthopedics;  Laterality: Right;   CORONARY ANGIOGRAPHY N/A 12/26/2016   Procedure: CORONARY ANGIOGRAPHY;  Surgeon: Burnard Debby LABOR, MD;  [facility];  Service: Cardiovascular;  Laterality: N/A;   CORONARY STENT INTERVENTION N/A 12/25/2016   Procedure: CORONARY STENT INTERVENTION;  Surgeon: verlin Lonni BIRCH, MD;  [facility];  Service: Cardiovascular;  Laterality: N/A;   CORONARY STENT INTERVENTION N/A 12/26/2016   Procedure: CORONARY STENT INTERVENTION;  Surgeon: Burnard Debby LABOR, MD;  [facility];  Service: Cardiovascular;  Laterality: N/A;   CYSTOSCOPY/URETEROSCOPY/HOLMIUM LASER/STENT PLACEMENT Right 01/07/2018   Procedure: RIGHT URETEROSCOPY/HOLMIUM LASER/STENT PLACEMENT;  Surgeon: Carolee Sherwood JONETTA DOUGLAS, MD;  Location: [facility];  Service: Urology;  Laterality: Right;   CYSTOSCOPY/URETEROSCOPY/HOLMIUM LASER/STENT PLACEMENT Left 03/22/2022   Procedure: CYSTOSCOPY LEFT URETEROSCOPY/STENT PLACEMENT;  Surgeon: Carolee Sherwood JONETTA DOUGLAS, MD;  Location: [facility];  Service: Urology;  Laterality: Left;   ESOPHAGOGASTRODUODENOSCOPY (EGD) WITH PROPOFOL  N/A 01/12/2017   Procedure: ESOPHAGOGASTRODUODENOSCOPY (EGD) WITH PROPOFOL ;  Surgeon: Dianna Specking, MD;  Location: [facility] ENDOSCOPY;  Service: Endoscopy;  Laterality: N/A;   HAND TENDON SURGERY Right 10-29-2002   dr sissy @[facility]     right index and thumb   KNEE ARTHROSCOPY Bilateral 2009-;2010   @[facility]    LEFT HEART CATH AND CORONARY ANGIOGRAPHY N/A 12/25/2016   Procedure: LEFT HEART CATH AND CORONARY ANGIOGRAPHY;  Surgeon: Verlin Lonni JONETTA, MD;  [facility];  Service: Cardiovascular;  Laterality: N/A;   LEFT HEART CATH AND CORONARY ANGIOGRAPHY N/A 10/10/2019   Procedure: LEFT HEART CATH AND CORONARY ANGIOGRAPHY;  Surgeon: Burnard Debby LABOR, MD;  [facility];  Service: Cardiovascular;  Laterality: N/A;   LUMBAR LAMINECTOMY/DECOMPRESSION MICRODISCECTOMY N/A 06/09/2015   Procedure: Laminectomy - T12-L1;  Surgeon: Alm GORMAN Molt, MD;  [facility];  Service: Neurosurgery;  Laterality: N/A;  Laminectomy - T12-L1   MEDIAL PARTIAL KNEE REPLACEMENT Bilateral 2009-2010    [facility]    MELANOMA EXCISION Right    side of my nose   REVERSE SHOULDER ARTHROPLASTY Right 03/31/2021   Procedure: REVERSE SHOULDER ARTHROPLASTY;  Surgeon: Melita Drivers, MD;  Location: [facility];  Service: Orthopedics;  Laterality: Right;    SHOULDER SURGERY Left 1985   TOTAL SHOULDER ARTHROPLASTY Left 12/06/2012   Procedure: LEFT TOTAL SHOULDER ARTHROPLASTY VERSES A REVERSE TOTAL SHOULDER ARTHROPLASTY;  Surgeon: Elspeth JONELLE Her, MD;  [facility];  Service: Orthopedics;  Laterality: Left;   Patient Active Problem List   Diagnosis Date Noted   Acute CVA (cerebrovascular accident) (HCC) 03/20/2022   Normocytic anemia 10/05/2021   Iron deficiency anemia due to chronic blood loss 10/05/2021   Pain due to unicompartmental arthroplasty of knee (HCC) 08/25/2020   Benign prostatic hyperplasia 06/22/2020   Chronic kidney disease, stage 3 unspecified (HCC) 06/22/2020   Coronary artery disease 06/22/2020   Gastro-esophageal reflux disease without esophagitis 06/22/2020   Kidney stone 06/22/2020   Long term current use of insulin  (HCC) 06/22/2020   Polyneuropathy due to type 2 diabetes mellitus (HCC)  06/22/2020   Peripheral arterial disease (HCC) 11/19/2019   RBBB 01/16/2017   PUD (peptic ulcer disease) 01/16/2017   Melena 01/12/2017   Unstable angina (HCC) 01/11/2017   CAD S/P percutaneous coronary angioplasty 01/09/2017   Mixed hyperlipidemia 01/09/2017   DM type 2 causing vascular disease (HCC) 12/24/2016   Essential hypertension, benign 12/24/2016   Chest pain, rule out acute myocardial infarction 12/24/2016   ACS (acute coronary syndrome) (HCC)    History of NSTEMI    Dysfunction of right eustachian tube 02/10/2016   Myelopathy (HCC) 06/09/2015    REFERRING PROVIDER: Clotilda Mail PA  REFERRING DIAG: Left hip ORIF  THERAPY DIAG:  Pain in left hip  Muscle weakness (generalized)  Rationale for Evaluation and Treatment: Rehabilitation  ONSET DATE: 06/05/23  SUBJECTIVE:   SUBJECTIVE STATEMENT: Pt reports 1-2/10 left hip pain today.    PERTINENT HISTORY: Please see above.  Bilateral TKA's.   PAIN:  Are you having pain? Yes: NPRS scale: 1-2/10 Pain  location: Left hip. Pain description: Sore. Aggravating factors: Rest. Relieving factors: Moving.  PRECAUTIONS: Fall.  Patient needs to use FWW at all times.    RED FLAGS: None   WEIGHT BEARING RESTRICTIONS: No  FALLS:  Has patient fallen in last 6 months? Yes. Number of falls 6.  Now using walker.  LIVING ENVIRONMENT: Lives with: lives with their spouse Lives in: House/apartment Stairs: Ramp. Has following equipment at home: Vannie - 2 wheeled  OCCUPATION: Retired.  PLOF: Independent with household mobility with device  PATIENT GOALS: Walk safely without left hip pain.    OBJECTIVE:  Note: Objective measures were completed at Evaluation unless otherwise noted.   PATIENT SURVEYS:  LEFS 19/80.   PALPATION: Patient pointing to area at mid-point of Aquacel.    LOWER EXTREMITY ROM:  Left knee -10 degrees and right is -15 degrees.  Left hip flexion to 95 degrees.  LOWER EXTREMITY  MMT:  Patient performing a right SLR (part of his hospital d/c HEP) and SAQ without assist.    GAIT: Safe gait with a FWW with a decrease in step and stride length.                                                                                                                                TREATMENT DATE:                                  08/14/23 EXERCISE LOG  Exercise Repetitions and Resistance Comments  Nustep  L4 x 17 minutes    Rocker board  5 minutes    Static stance on foam  3 minutes Narrow BOS; intermittent UE support from parallel bars  Tandem Gait Airex x 3 mins   Side stepping  Airex x 3 mins   Manual therapy  See below    Blank cell = exercise not performed today  Manual Therapy Soft Tissue Mobilization: left TFL and IT band, for reduced pain and tone   07/30/23:  Nustep level 4 seat 9 x . balance  tandem stance in // bars  Gait with SPC in clinic SBA Manual:  patient in right sdly position with pillow between knees for comfort and received STW/M to patient's left lateral hip musculature as well as scar mass/mobs  07/16/23:                                   EXERCISE LOG  Exercise Repetitions and Resistance Comments  Nustep  Level 4 x 20 minutes   Ball squeezes 5 minutes    LAQ's 5# x 5 minutes   Hip abduction Green theraband x 5 minutes   Rockerboard 4 minutes    PATIENT EDUCATION:  Education details: healing, anatomy, and progress with physical therapy Person educated: Patient Education method: Explanation Education comprehension:  verbalized understanding  HOME EXERCISE PROGRAM: As above.  ASSESSMENT:  CLINICAL IMPRESSION: Pt arrives for today's treatment session reporting 1-2/10 left hip pain.  Pt reports that his hip is feeling much better than it did before starting therapy.  Pt able to tolerate increased time with balance exercises today.  Pt introduced to tandem gait on Airex balance beam with BUE support required for safety and stability.   STW/M performed to left hip to decrease pain and tone.  Pt denied any pain at completion of today's treatment session.   OBJECTIVE IMPAIRMENTS: decreased activity tolerance, decreased ROM, decreased strength, and pain.   ACTIVITY LIMITATIONS: carrying, lifting, bending, stairs, and locomotion level  PARTICIPATION LIMITATIONS: meal prep, cleaning, laundry, shopping, community activity, and yard work  PERSONAL FACTORS: 1-2 comorbidities: multiple co-morbidities are also affecting patient's functional outcome.   REHAB POTENTIAL: Good  CLINICAL DECISION MAKING: Stable/uncomplicated  EVALUATION COMPLEXITY: Low   GOALS:  SHORT TERM GOALS: Target date: 07/03/23.  Ind with an initial HEP. Goal status: MET   LONG TERM GOALS: Target date: 08/14/23  Ind with an advanced HEP.  Goal status: IN PROGRESS  2.  Patient walk 1,000 feet with a FWW.  Goal status: IN PROGRESS  3.  Left hip strength to 4+/5 to increase stability for functional tasks  Goal status: IN PROGRESS  4.  Perform ADL's with pain not > 2-3/10.  Goal status: IN PROGRESS  PLAN:  PT FREQUENCY: 15 visits per [place].    PLANNED INTERVENTIONS: 97110-Therapeutic exercises, 97530- Therapeutic activity, [id number]- Neuromuscular re-education, [phone]- Self Care, 02859- Manual therapy, G0283- Electrical stimulation (unattended), Patient/Family education, and Cryotherapy  PLAN FOR NEXT SESSION: Nustep, gait activities, strengthening exercises.  No Modalities after 2 weeks   Delon DELENA Gosling, PTA 08/14/2023, 11:45 AM

## 2023-08-20 ENCOUNTER — Ambulatory Visit

## 2023-08-20 DIAGNOSIS — M25552 Pain in left hip: Secondary | ICD-10-CM | POA: Diagnosis not present

## 2023-08-20 DIAGNOSIS — M6281 Muscle weakness (generalized): Secondary | ICD-10-CM

## 2023-08-20 NOTE — Therapy (Addendum)
 OUTPATIENT PHYSICAL THERAPY LOWER EXTREMITY TREATMENT   Patient Name: Matthew [name] MRN: [id number] DOB:1940-02-22, 83 y.o., male Today's Date: 08/20/2023  END OF SESSION:  PT End of Session - 08/20/23 1514     Visit Number 15    Number of Visits 15    Date for PT Re-Evaluation 08/14/23    Authorization - Number of Visits 15    PT Start Time 1508    PT Stop Time 1556    PT Time Calculation (min) 48 min    Activity Tolerance Patient tolerated treatment well    Behavior During Therapy [facility] for tasks assessed/performed             Past Medical History:  Diagnosis Date   Anticoagulated    plavix    Arthritis    Shoulder, knees, back    CAD in native artery cardiologist-  dr verlin   a. CAD/NSTEMI ,  cardiac cath staged stenting-- 12-25-2016  PTCA and DES x3 to prox. and mid LAD;  12-26-2016  PCI to PLA and DES x1 to midRCA,  EF 60-65%.   Chronic lower back pain    CKD (chronic kidney disease), stage III (HCC)    Complication of anesthesia    Too much with shoulder surgery, pt. reports that he was told the at they lost him, due to absorbing too much anesthesia.  shoulder surgery 1985   DDD (degenerative disc disease), lumbosacral    GERD (gastroesophageal reflux disease)    History of gastric ulcer 01/2017   History of kidney stones    History of malignant melanoma    right side of nose   History of non-ST elevation myocardial infarction (NSTEMI) 12/23/2017   s/p  staged cardiac cath,  s/p  PCI and DEStenting   Hyperlipidemia    Hypertension    IDA (iron deficiency anemia)    Iron deficiency anemia due to chronic blood loss 10/05/2021   Myocardial infarction (HCC)    x 2   Neuromuscular disorder (HCC)    neuropathy   Nocturia    RBBB    Renal calculus, right    S/P drug eluting coronary stent placement 12-25-2017,  12-26-2017   PTCA and DES x3 to prox. and mid LAD;  PCI to PLA and DES x1 to midRCA   Type 2 diabetes mellitus treated with insulin  (HCC)     followed by pcp   Past Surgical History:  Procedure Laterality Date   ANAL FISSURE REPAIR  X 2   BACK SURGERY     CATARACT EXTRACTION W/ INTRAOCULAR LENS  IMPLANT, BILATERAL Bilateral 2017;  2015   COLONOSCOPY WITH PROPOFOL  N/A 06/05/2016   Procedure: COLONOSCOPY WITH PROPOFOL ;  Surgeon: Gladis MARLA Louder, MD;  Location: [facility];  Service: Endoscopy;  Laterality: N/A;   CONVERSION TO TOTAL KNEE Right 08/25/2020   Procedure: Revision right knee unicompartmental arthroplasty to total knee arthroplasty;  Surgeon: Melodi Lerner, MD;  Location: [facility];  Service: Orthopedics;  Laterality: Right;   CORONARY ANGIOGRAPHY N/A 12/26/2016   Procedure: CORONARY ANGIOGRAPHY;  Surgeon: Burnard Debby LABOR, MD;  Location: [facility];  Service: Cardiovascular;  Laterality: N/A;   CORONARY STENT INTERVENTION N/A 12/25/2016   Procedure: CORONARY STENT INTERVENTION;  Surgeon: verlin Lonni BIRCH, MD;  Location: [facility];  Service: Cardiovascular;  Laterality: N/A;   CORONARY STENT INTERVENTION N/A 12/26/2016   Procedure: CORONARY STENT INTERVENTION;  Surgeon: Burnard Debby LABOR, MD;  Location: [facility];  Service: Cardiovascular;  Laterality: N/A;   CYSTOSCOPY/URETEROSCOPY/HOLMIUM LASER/STENT PLACEMENT Right 01/07/2018   Procedure: RIGHT URETEROSCOPY/HOLMIUM LASER/STENT PLACEMENT;  Surgeon: Carolee Sherwood JONETTA DOUGLAS, MD;  Location: [facility];  Service: Urology;  Laterality: Right;   CYSTOSCOPY/URETEROSCOPY/HOLMIUM LASER/STENT PLACEMENT Left 03/22/2022   Procedure: CYSTOSCOPY LEFT URETEROSCOPY/STENT PLACEMENT;  Surgeon: Carolee Sherwood JONETTA DOUGLAS, MD;  Location: [facility];  Service: Urology;  Laterality: Left;   ESOPHAGOGASTRODUODENOSCOPY (EGD) WITH PROPOFOL  N/A 01/12/2017   Procedure: ESOPHAGOGASTRODUODENOSCOPY (EGD) WITH PROPOFOL ;  Surgeon: Dianna Specking, MD;  Location: [facility];  Service: Endoscopy;  Laterality: N/A;   HAND TENDON SURGERY Right 10-29-2002   dr sissy @[facility]     right index and thumb   KNEE ARTHROSCOPY Bilateral 2009-;2010   @[facility]    LEFT HEART CATH AND CORONARY ANGIOGRAPHY N/A 12/25/2016   Procedure: LEFT HEART CATH AND CORONARY ANGIOGRAPHY;  Surgeon: Verlin Lonni JONETTA, MD;  Location: [facility];  Service: Cardiovascular;  Laterality: N/A;   LEFT HEART CATH AND CORONARY ANGIOGRAPHY N/A 10/10/2019   Procedure: LEFT HEART CATH AND CORONARY ANGIOGRAPHY;  Surgeon: Burnard Debby LABOR, MD;  Location: [facility];  Service: Cardiovascular;  Laterality: N/A;   LUMBAR LAMINECTOMY/DECOMPRESSION MICRODISCECTOMY N/A 06/09/2015   Procedure: Laminectomy - T12-L1;  Surgeon: Alm GORMAN Molt, MD;  Location: [facility];  Service: Neurosurgery;  Laterality: N/A;  Laminectomy - T12-L1   MEDIAL PARTIAL KNEE REPLACEMENT Bilateral 2009-2010    [facility]    MELANOMA EXCISION Right    side of my nose   REVERSE SHOULDER ARTHROPLASTY Right 03/31/2021   Procedure: REVERSE SHOULDER ARTHROPLASTY;  Surgeon: Melita Drivers, MD;  Location: [facility];  Service: Orthopedics;  Laterality: Right;    SHOULDER SURGERY Left 1985   TOTAL SHOULDER ARTHROPLASTY Left 12/06/2012   Procedure: LEFT TOTAL SHOULDER ARTHROPLASTY VERSES A REVERSE TOTAL SHOULDER ARTHROPLASTY;  Surgeon: Elspeth JONELLE Her, MD;  Location: [facility];  Service: Orthopedics;  Laterality: Left;   Patient Active Problem List   Diagnosis Date Noted   Acute CVA (cerebrovascular accident) (HCC) 03/20/2022   Normocytic anemia 10/05/2021   Iron deficiency anemia due to chronic blood loss 10/05/2021   Pain due to unicompartmental arthroplasty of knee (HCC) 08/25/2020   Benign prostatic hyperplasia 06/22/2020   Chronic kidney disease, stage 3 unspecified (HCC) 06/22/2020   Coronary artery disease 06/22/2020   Gastro-esophageal reflux disease without esophagitis 06/22/2020   Kidney stone 06/22/2020   Long term current use of insulin  (HCC) 06/22/2020   Polyneuropathy due to type 2 diabetes mellitus (HCC)  06/22/2020   Peripheral arterial disease (HCC) 11/19/2019   RBBB 01/16/2017   PUD (peptic ulcer disease) 01/16/2017   Melena 01/12/2017   Unstable angina (HCC) 01/11/2017   CAD S/P percutaneous coronary angioplasty 01/09/2017   Mixed hyperlipidemia 01/09/2017   DM type 2 causing vascular disease (HCC) 12/24/2016   Essential hypertension, benign 12/24/2016   Chest pain, rule out acute myocardial infarction 12/24/2016   ACS (acute coronary syndrome) (HCC)    History of NSTEMI    Dysfunction of right eustachian tube 02/10/2016   Myelopathy (HCC) 06/09/2015    REFERRING PROVIDER: Clotilda Mail PA  REFERRING DIAG: Left hip ORIF  THERAPY DIAG:  Pain in left hip  Muscle weakness (generalized)  Rationale for Evaluation and Treatment: Rehabilitation  ONSET DATE: 06/05/23  SUBJECTIVE:   SUBJECTIVE STATEMENT: Pt reports 1-2/10 left hip pain today.    PERTINENT HISTORY: Please see above.  Bilateral TKA's.   PAIN:  Are you having pain? Yes: NPRS scale: 1-2/10 Pain  location: Left hip. Pain description: Sore. Aggravating factors: Rest. Relieving factors: Moving.  PRECAUTIONS: Fall.  Patient needs to use FWW at all times.    RED FLAGS: None   WEIGHT BEARING RESTRICTIONS: No  FALLS:  Has patient fallen in last 6 months? Yes. Number of falls 6.  Now using walker.  LIVING ENVIRONMENT: Lives with: lives with their spouse Lives in: House/apartment Stairs: Ramp. Has following equipment at home: Vannie - 2 wheeled  OCCUPATION: Retired.  PLOF: Independent with household mobility with device  PATIENT GOALS: Walk safely without left hip pain.    OBJECTIVE:  Note: Objective measures were completed at Evaluation unless otherwise noted.   PATIENT SURVEYS:  LEFS 19/80.   PALPATION: Patient pointing to area at mid-point of Aquacel.    LOWER EXTREMITY ROM:  Left knee -10 degrees and right is -15 degrees.  Left hip flexion to 95 degrees.  LOWER EXTREMITY  MMT:  Patient performing a right SLR (part of his hospital d/c HEP) and SAQ without assist.    GAIT: Safe gait with a FWW with a decrease in step and stride length.                                                                                                                                TREATMENT DATE:                                  08/14/23 EXERCISE LOG  Exercise Repetitions and Resistance Comments  Nustep  L4 x 16 minutes    Goal Assessment See below   Rocker board     Static stance on foam   Narrow BOS; intermittent UE support from parallel bars  Tandem Gait    Side stepping     Manual therapy  See below    Blank cell = exercise not performed today  Manual Therapy Soft Tissue Mobilization: left TFL and IT band, for reduced pain and tone   07/30/23:  Nustep level 4 seat 9 x . balance  tandem stance in // bars  Gait with SPC in clinic SBA Manual:  patient in right sdly position with pillow between knees for comfort and received STW/M to patient's left lateral hip musculature as well as scar mass/mobs  07/16/23:                                   EXERCISE LOG  Exercise Repetitions and Resistance Comments  Nustep  Level 4 x 20 minutes   Ball squeezes 5 minutes    LAQ's 5# x 5 minutes   Hip abduction Green theraband x 5 minutes   Rockerboard 4 minutes    PATIENT EDUCATION:  Education details: healing, anatomy, and progress with physical therapy Person educated: Patient Education method: Explanation Education comprehension: verbalized understanding  HOME EXERCISE PROGRAM: As above.  ASSESSMENT:  CLINICAL IMPRESSION: Pt arrives for today's treatment session reporting 1-2/10 left hip pain.  Pt able to demonstrate 4+/5 to 5/5 global left hip strength today, meeting his LTG.  Pt also able to ambulate > 1,000 ft with use of FWW, meeting that LTG as well.  Pt has met all goals set forth for him by physical therapy at this time.  Pt encouraged to call the facility  with any questions or concerns.  STW/M performed to left hip to decrease pain and tone.  Pt denied any pain at completion of today's treatment session.  Pt ready for discharge at this time.   OBJECTIVE IMPAIRMENTS: decreased activity tolerance, decreased ROM, decreased strength, and pain.   ACTIVITY LIMITATIONS: carrying, lifting, bending, stairs, and locomotion level  PARTICIPATION LIMITATIONS: meal prep, cleaning, laundry, shopping, community activity, and yard work  PERSONAL FACTORS: 1-2 comorbidities: multiple co-morbidities are also affecting patient's functional outcome.   REHAB POTENTIAL: Good  CLINICAL DECISION MAKING: Stable/uncomplicated  EVALUATION COMPLEXITY: Low   GOALS:  SHORT TERM GOALS: Target date: 07/03/23.  Ind with an initial HEP. Goal status: MET   LONG TERM GOALS: Target date: 08/14/23  Ind with an advanced HEP.  Goal status: MET  2.  Patient walk 1,000 feet with a FWW.  Goal status: MET  3.  Left hip strength to 4+/5 to increase stability for functional tasks   7/14:  Goal status: MET  4.  Perform ADL's with pain not > 2-3/10.  Goal status: MET  PLAN:  PT FREQUENCY: 15 visits per [place].    PLANNED INTERVENTIONS: 97110-Therapeutic exercises, 97530- Therapeutic activity, [id number]- Neuromuscular re-education, [phone]- Self Care, 02859- Manual therapy, [id number]- Electrical stimulation (unattended), Patient/Family education, and Cryotherapy  PLAN FOR NEXT SESSION: Nustep, gait activities, strengthening exercises.  No Modalities after 2 weeks   Delon DELENA Gosling, PTA 08/20/2023, 4:09 PM   PHYSICAL THERAPY DISCHARGE SUMMARY  Visits from Start of Care: 15.  Current functional level related to goals / functional outcomes: See above.   Remaining deficits: All goals met.   Education / Equipment: HEP.   Patient agrees to discharge. Patient goals were met. Patient is being discharged due to meeting the stated rehab goals.    Chad Applegate MPT

## 2023-08-21 DIAGNOSIS — I2510 Atherosclerotic heart disease of native coronary artery without angina pectoris: Secondary | ICD-10-CM | POA: Diagnosis not present

## 2023-08-21 DIAGNOSIS — N1831 Chronic kidney disease, stage 3a: Secondary | ICD-10-CM | POA: Diagnosis not present

## 2023-08-21 DIAGNOSIS — E1122 Type 2 diabetes mellitus with diabetic chronic kidney disease: Secondary | ICD-10-CM | POA: Diagnosis not present

## 2023-08-21 DIAGNOSIS — I10 Essential (primary) hypertension: Secondary | ICD-10-CM | POA: Diagnosis not present

## 2023-08-21 DIAGNOSIS — G720 Drug-induced myopathy: Secondary | ICD-10-CM | POA: Diagnosis not present

## 2023-08-21 DIAGNOSIS — R269 Unspecified abnormalities of gait and mobility: Secondary | ICD-10-CM | POA: Diagnosis not present

## 2023-08-21 DIAGNOSIS — E1142 Type 2 diabetes mellitus with diabetic polyneuropathy: Secondary | ICD-10-CM | POA: Diagnosis not present

## 2023-08-21 DIAGNOSIS — I252 Old myocardial infarction: Secondary | ICD-10-CM | POA: Diagnosis not present

## 2023-08-21 DIAGNOSIS — E7800 Pure hypercholesterolemia, unspecified: Secondary | ICD-10-CM | POA: Diagnosis not present

## 2023-08-21 DIAGNOSIS — I739 Peripheral vascular disease, unspecified: Secondary | ICD-10-CM | POA: Diagnosis not present

## 2023-09-11 ENCOUNTER — Encounter: Payer: Self-pay | Admitting: Cardiovascular Disease

## 2023-09-11 ENCOUNTER — Ambulatory Visit: Attending: Cardiovascular Disease | Admitting: Cardiovascular Disease

## 2023-09-11 VITALS — BP 144/70 | HR 67 | Ht 68.0 in | Wt 172.8 lb

## 2023-09-11 DIAGNOSIS — E785 Hyperlipidemia, unspecified: Secondary | ICD-10-CM

## 2023-09-11 DIAGNOSIS — I25118 Atherosclerotic heart disease of native coronary artery with other forms of angina pectoris: Secondary | ICD-10-CM | POA: Diagnosis not present

## 2023-09-11 DIAGNOSIS — I10 Essential (primary) hypertension: Secondary | ICD-10-CM

## 2023-09-11 NOTE — Patient Instructions (Signed)
 Medication Instructions:  No changes *If you need a refill on your cardiac medications before your next appointment, please call your pharmacy*  Lab Work: none If you have labs (blood work) drawn today and your tests are   Testing/Procedures: none  Follow-Up: At [facility], you and your health needs are our priority.  As part of our continuing mission to provide you with exceptional heart care, our providers are all part of one team.  This team includes your primary Cardiologist (physician) and Advanced Practice Providers or APPs (Physician Assistants and Nurse Practitioners) who all work together to provide you with the care you need, when you need it.  Your next appointment:   12 month(s)  Provider:   Lonni Cash, MD

## 2023-09-11 NOTE — Progress Notes (Signed)
 '  Chief Complaint  Patient presents with   Follow-up    CAD   History of Present Illness: 83 yo male with history of DM, HTN, SVT, PTSD and CAD here today for follow up. He was admitted to [facility] November 2018 with a NSTEMI. Cardiac cath November 2018 with severe stenosis in the mid LAD and mid RCA treated with 3 drug eluting stents in LAD and one drug eluting stent in the mid RCA. The posterolateral branch had a severe stenosis treated with balloon angioplasty. Echo November 2018 with normal LV systolic function. He was placed on ASA and Brilinta  but admitted December 2018 with melena and chest pain. EGD showed a non bleeding ulcer and gastritis. He was seen in March 2020 by Olivia Pavy, PA and had c/o chest pain when walking in cold weather. Imdur  was increased. Nuclear stress test 04/15/18 with no ischemia. He was admitted to [facility] September 2021 with chest pain. Cardiac cath 10/10/19 with patent LAD and RCA stents with moderate stenosis in the small Circumflex and moderate stenosis in the PDA. Also severe disease in an RV marginal branch. Normal LV function. Medical therapy recommended. He has since been on Plavix . I saw him in the office 09/12/21 and he was c/o dizziness with HR in the 50s at home. Norvasc  was stopped at that visit due to hypotension. Echo 09/20/21 with LVEF=60-65%. Normal RV function. Mild MR. Cardiac monitor with sinus brady, lowest HR 45 bpm with no high grade AV block. One 4 beat run of SVT. Rare PACs/PVCs. He was admitted to [facility] in February 2024 with altered mental status in the setting of kidney stone, dehydration and opioids. Brain MRI with no acute stroke. Echo 03/21/22 with LVEF=65-70%. No significant valve disease. I saw him in the office 01/11/23 and he c/o chest pain when under stressful situations. Imdur  increased to 60 mg po BID. He was seen in the office in January 2025 and was feeling better. He broke his left femur after a fall in April 2025. He had surgery in [place], [place].    He is here today for follow up. The patient denies any chest pain, dyspnea, palpitations, lower extremity edema, orthopnea, PND, dizziness, near syncope or syncope.   Primary Care Physician: Ransom Other, MD  Past Medical History:  Diagnosis Date   Anticoagulated    plavix    Arthritis    Shoulder, knees, back    CAD in native artery cardiologist-  dr verlin   a. CAD/NSTEMI ,  cardiac cath staged stenting-- 12-25-2016  PTCA and DES x3 to prox. and mid LAD;  12-26-2016  PCI to PLA and DES x1 to midRCA,  EF 60-65%.   Chronic lower back pain    CKD (chronic kidney disease), stage III (HCC)    Complication of anesthesia    Too much with shoulder surgery, pt. reports that he was told the at they lost him, due to absorbing too much anesthesia.  shoulder surgery 1985   DDD (degenerative disc disease), lumbosacral    GERD (gastroesophageal reflux disease)    History of gastric ulcer 01/2017   History of kidney stones    History of malignant melanoma    right side of nose   History of non-ST elevation myocardial infarction (NSTEMI) 12/23/2017   s/p  staged cardiac cath,  s/p  PCI and DEStenting   Hyperlipidemia    Hypertension    IDA (iron deficiency anemia)    Iron deficiency anemia due to chronic blood loss 10/05/2021  Myocardial infarction (HCC)    x 2   Neuromuscular disorder (HCC)    neuropathy   Nocturia    RBBB    Renal calculus, right    S/P drug eluting coronary stent placement 12-25-2017,  12-26-2017   PTCA and DES x3 to prox. and mid LAD;  PCI to PLA and DES x1 to midRCA   Type 2 diabetes mellitus treated with insulin  (HCC)    followed by pcp    Past Surgical History:  Procedure Laterality Date   ANAL FISSURE REPAIR  X 2   BACK SURGERY     CATARACT EXTRACTION W/ INTRAOCULAR LENS  IMPLANT, BILATERAL Bilateral 2017;  2015   COLONOSCOPY WITH PROPOFOL  N/A 06/05/2016   Procedure: COLONOSCOPY WITH PROPOFOL ;  Surgeon: Gladis MARLA Louder, MD;  Location: [facility];   Service: Endoscopy;  Laterality: N/A;   CONVERSION TO TOTAL KNEE Right 08/25/2020   Procedure: Revision right knee unicompartmental arthroplasty to total knee arthroplasty;  Surgeon: Melodi Lerner, MD;  Location: [facility];  Service: Orthopedics;  Laterality: Right;   CORONARY ANGIOGRAPHY N/A 12/26/2016   Procedure: CORONARY ANGIOGRAPHY;  Surgeon: Burnard Debby LABOR, MD;  Location: [facility];  Service: Cardiovascular;  Laterality: N/A;   CORONARY STENT INTERVENTION N/A 12/25/2016   Procedure: CORONARY STENT INTERVENTION;  Surgeon: Verlin Lonni BIRCH, MD;  Location: [facility];  Service: Cardiovascular;  Laterality: N/A;   CORONARY STENT INTERVENTION N/A 12/26/2016   Procedure: CORONARY STENT INTERVENTION;  Surgeon: Burnard Debby LABOR, MD;  Location: [facility];  Service: Cardiovascular;  Laterality: N/A;   CYSTOSCOPY/URETEROSCOPY/HOLMIUM LASER/STENT PLACEMENT Right 01/07/2018   Procedure: RIGHT URETEROSCOPY/HOLMIUM LASER/STENT PLACEMENT;  Surgeon: Carolee Sherwood BIRCH DOUGLAS, MD;  Location: [facility];  Service: Urology;  Laterality: Right;   CYSTOSCOPY/URETEROSCOPY/HOLMIUM LASER/STENT PLACEMENT Left 03/22/2022   Procedure: CYSTOSCOPY LEFT URETEROSCOPY/STENT PLACEMENT;  Surgeon: Carolee Sherwood BIRCH DOUGLAS, MD;  Location: [facility];  Service: Urology;  Laterality: Left;   ESOPHAGOGASTRODUODENOSCOPY (EGD) WITH PROPOFOL  N/A 01/12/2017   Procedure: ESOPHAGOGASTRODUODENOSCOPY (EGD) WITH PROPOFOL ;  Surgeon: Dianna Specking, MD;  Location: [facility] ENDOSCOPY;  Service: Endoscopy;  Laterality: N/A;   HAND TENDON SURGERY Right 10-29-2002   dr sissy @[facility]    right index and thumb   KNEE ARTHROSCOPY Bilateral 2009-;2010   @[facility]    LEFT HEART CATH AND CORONARY ANGIOGRAPHY N/A 12/25/2016   Procedure: LEFT HEART CATH AND CORONARY ANGIOGRAPHY;  Surgeon: Verlin Lonni BIRCH, MD;  [facility];  Service: Cardiovascular;  Laterality: N/A;   LEFT HEART CATH AND CORONARY  ANGIOGRAPHY N/A 10/10/2019   Procedure: LEFT HEART CATH AND CORONARY ANGIOGRAPHY;  Surgeon: Burnard Debby LABOR, MD;  [facility];  Service: Cardiovascular;  Laterality: N/A;   LUMBAR LAMINECTOMY/DECOMPRESSION MICRODISCECTOMY N/A 06/09/2015   Procedure: Laminectomy - T12-L1;  Surgeon: Alm GORMAN Molt, MD;  Location: [facility];  Service: Neurosurgery;  Laterality: N/A;  Laminectomy - T12-L1   MEDIAL PARTIAL KNEE REPLACEMENT Bilateral 2009-2010    [facility]    MELANOMA EXCISION Right    side of my nose   REVERSE SHOULDER ARTHROPLASTY Right 03/31/2021   Procedure: REVERSE SHOULDER ARTHROPLASTY;  Surgeon: Melita Drivers, MD;  Location: [facility];  Service: Orthopedics;  Laterality: Right;    SHOULDER SURGERY Left 1985   TOTAL SHOULDER ARTHROPLASTY Left 12/06/2012   Procedure: LEFT TOTAL SHOULDER ARTHROPLASTY VERSES A REVERSE TOTAL SHOULDER ARTHROPLASTY;  Surgeon: Elspeth JONELLE Her, MD;  Location: [facility];  Service: Orthopedics;  Laterality: Left;  Current Outpatient Medications  Medication Sig Dispense Refill   acetaminophen  (TYLENOL ) 500 MG tablet Take 1,000 mg by mouth every 6 (six) hours as needed for mild pain.     amLODipine  (NORVASC ) 10 MG tablet Take 10 mg by mouth daily.     carvedilol (COREG) 6.25 MG tablet Take 6.25 mg by mouth 2 (two) times daily with a meal.     clopidogrel  (PLAVIX ) 75 MG tablet Take 1 tablet (75 mg total) by mouth daily. Take aspirin  and Plavix  together for 21 days, then stop aspirin  and continue taking Plavix  indefinitely. 30 tablet 3   Continuous Blood Gluc Receiver (FREESTYLE LIBRE 2 READER) DEVI As directed 1 each 0   Continuous Blood Gluc Sensor (FREESTYLE LIBRE 2 SENSOR) MISC 1 Piece by Does not apply route every 14 (fourteen) days. 2 each 3   empagliflozin  (JARDIANCE ) 25 MG TABS tablet Take 12.5 mg by mouth daily.     ezetimibe  (ZETIA ) 10 MG tablet Take 1 tablet (10 mg total) by mouth daily. 90 tablet 3   famotidine (PEPCID) 20 MG tablet Take 20  mg by mouth daily.     isosorbide  mononitrate (IMDUR ) 60 MG 24 hr tablet Take 1 tablet (60 mg total) by mouth daily. 180 tablet 3   losartan  (COZAAR ) 50 MG tablet Take 1 tablet (50 mg total) by mouth daily. (Patient taking differently: Take 50 mg by mouth at bedtime.) 90 tablet 1   metFORMIN  (GLUCOPHAGE ) 500 MG tablet Take 500 mg by mouth daily with breakfast.     nitroGLYCERIN  (NITROSTAT ) 0.4 MG SL tablet Place 1 tablet (0.4 mg total) under the tongue every 5 (five) minutes as needed for chest pain. 30 tablet 12   pantoprazole  (PROTONIX ) 40 MG tablet Take 40 mg by mouth daily.     potassium citrate (UROCIT-K) 10 MEQ (1080 MG) SR tablet Take 15 mEq by mouth 3 (three) times daily with meals.     sertraline (ZOLOFT) 50 MG tablet Take 50 mg by mouth daily.     Zinc 50 MG CAPS Take 50 mg by mouth daily.     amoxicillin (AMOXIL) 500 MG capsule Take 2,000 mg by mouth See admin instructions. Take 2000 mg by mouth 1 hour prior to dental work (Patient not taking: Reported on 09/11/2023)     ferrous gluconate (FERGON) 324 MG tablet Take 324 mg by mouth 3 (three) times a week. Mon, Wed, friday (Patient not taking: Reported on 09/11/2023)     Omega-3 Fatty Acids (FISH OIL) 1000 MG CAPS Take 1,000 mg by mouth daily. (Patient not taking: Reported on 09/11/2023)     ondansetron  (ZOFRAN ) 4 MG tablet Take 1 tablet (4 mg total) by mouth every 8 (eight) hours as needed for nausea or vomiting. (Patient not taking: Reported on 09/11/2023) 10 tablet 0   rosuvastatin  (CRESTOR ) 10 MG tablet Take 5 mg by mouth at bedtime. (Patient not taking: Reported on 09/11/2023)     Vitamin D, Cholecalciferol, 25 MCG (1000 UT) CAPS Take 1,000 Units by mouth daily.  (Patient not taking: Reported on 09/11/2023)     No current facility-administered medications for this visit.    Allergies  Allergen Reactions   Statins Other (See Comments)    Severe stomach pain.   Oxycodone  Itching   Hydrocodone  Other (See Comments)    messes with my hearing    Codeine Itching   Morphine  And Codeine Itching    Social History   Socioeconomic History   Marital status: Married  Spouse name: Not on file   Number of children: Not on file   Years of education: Not on file   Highest education level: Not on file  Occupational History   Not on file  Tobacco Use   Smoking status: Never   Smokeless tobacco: Never  Vaping Use   Vaping status: Never Used  Substance and Sexual Activity   Alcohol use: No   Drug use: No   Sexual activity: Not Currently  Other Topics Concern   Not on file  Social History Narrative   Not on file   Social Drivers of Health   Financial Resource Strain: Low Risk  (06/05/2023)   Received from [facility]   Overall Financial Resource Strain (CARDIA)    Difficulty of Paying Living Expenses: Not hard at all  Food Insecurity: No Food Insecurity (06/05/2023)   Received from [facility]   Hunger Vital Sign    Within the past 12 months, you worried that your food would run out before you got the money to buy more.: Never true    Within the past 12 months, the food you bought just didn't last and you didn't have money to get more.: Never true  Transportation Needs: No Transportation Needs (06/05/2023)   Received from [facility] - Transportation    Lack of Transportation (Medical): No    Lack of Transportation (Non-Medical): No  Physical Activity: Inactive (06/05/2023)   Received from [facility]   Exercise Vital Sign    On average, how many days per week do you engage in moderate to strenuous exercise (like a brisk walk)?: 0 days    On average, how many minutes do you engage in exercise at this level?: 0 min  Stress: No Stress Concern Present (06/05/2023)   Received from [facility] - Occupational Stress Questionnaire    Feeling of Stress : Only a little  Social Connections: Moderately Integrated (06/05/2023)   Received from [facility]   Social  Connection and Isolation Panel    In a typical week, how many times do you talk on the phone with family, friends, or neighbors?: More than three times a week    How often do you get together with friends or relatives?: Three times a week    How often do you attend church or religious services?: More than 4 times per year    Do you belong to any clubs or organizations such as church groups, unions, fraternal or athletic groups, or school groups?: No    How often do you attend meetings of the clubs or organizations you belong to?: Never    Are you married, widowed, divorced, separated, never married, or living with a partner?: Married  Intimate Partner Violence: Not At Risk (06/05/2023)   Received from [facility]   Humiliation, Afraid, Rape, and Kick questionnaire    Within the last year, have you been afraid of your partner or ex-partner?: No    Within the last year, have you been humiliated or emotionally abused in other ways by your partner or ex-partner?: No    Within the last year, have you been kicked, hit, slapped, or otherwise physically hurt by your partner or ex-partner?: No    Within the last year, have you been raped or forced to have any kind of sexual activity by your partner or ex-partner?: No    Family History  Problem Relation Age of Onset   CAD Father  Heart failure Father    CAD Sister    Stroke Mother    Heart failure Mother     Review of Systems:  As stated in the HPI and otherwise negative.   BP (!) 144/70 (BP Location: Left Arm, Patient Position: Sitting)   Pulse 67   Ht 5' 8 (1.727 m)   Wt 172 lb 12.8 oz (78.4 kg)   SpO2 97%   BMI 26.27 kg/m   Physical Examination: General: Well developed, well nourished, NAD  HEENT: OP clear, mucus membranes moist  SKIN: warm, dry. No rashes. Neuro: No focal deficits  Musculoskeletal: Muscle strength 5/5 all ext  Psychiatric: Mood and affect normal  Neck: No JVD, no carotid bruits, no thyromegaly, no  lymphadenopathy.  Lungs:Clear bilaterally, no wheezes, rhonci, crackles Cardiovascular: Regular rate and rhythm. No murmurs, gallops or rubs. Abdomen:Soft. Bowel sounds present. Non-tender.  Extremities: No lower extremity edema. Pulses are 2 + in the bilateral DP/PT.  EKG:  EKG is not ordered today The ekg ordered today demonstrates   Recent Labs: No results found for requested labs within last 365 days.   Lipid Panel    Component Value Date/Time   CHOL 132 03/21/2022 0310   CHOL 182 11/11/2020 0855   TRIG 142 03/21/2022 0310   HDL 34 (L) 03/21/2022 0310   HDL 40 11/11/2020 0855   CHOLHDL 3.9 03/21/2022 0310   VLDL 28 03/21/2022 0310   LDLCALC 70 03/21/2022 0310   LDLCALC 117 (H) 11/11/2020 0855     Wt Readings from Last 3 Encounters:  09/11/23 172 lb 12.8 oz (78.4 kg)  02/20/23 168 lb 9.6 oz (76.5 kg)  01/11/23 166 lb (75.3 kg)    Assessment and Plan:   1. CAD with angina: Drug eluting stents placed in the LAD and RCA in November 2018. CAD stable by cath in September 2021. He has normal LV systolic function by echo in February 2024. No chest pain c/w unstable angina. Continue Plavix , statin, beta blocker, Zetia  and Imdur .    2. Hyperlipidemia: Lipids followed at the [place]. Continue statin and Zetia .   3. HTN: BP is controlled. No changes today  Labs/ tests ordered today include:  No orders of the defined types were placed in this encounter.  Disposition:   F/U with me in 12 months  Signed, Lonni Cash, MD 09/11/2023 9:54 AM    [facility] [address] [place], [place], [place]  [address] Phone: [phone]; Fax: [phone]

## 2023-09-20 ENCOUNTER — Other Ambulatory Visit: Payer: Self-pay | Admitting: *Deleted

## 2023-10-18 ENCOUNTER — Ambulatory Visit: Admitting: Cardiovascular Disease
